# Patient Record
Sex: Female | Born: 1935 | Race: White | Hispanic: No | Smoking: Never smoker
Health system: Southern US, Community
[De-identification: ages and names within clinical notes are randomized; demographics above are authoritative.]

## PROBLEM LIST (undated history)

## (undated) DIAGNOSIS — I219 Acute myocardial infarction, unspecified: Secondary | ICD-10-CM

## (undated) DIAGNOSIS — R350 Frequency of micturition: Secondary | ICD-10-CM

## (undated) DIAGNOSIS — M359 Systemic involvement of connective tissue, unspecified: Secondary | ICD-10-CM

## (undated) DIAGNOSIS — D699 Hemorrhagic condition, unspecified: Secondary | ICD-10-CM

## (undated) DIAGNOSIS — N3020 Other chronic cystitis without hematuria: Secondary | ICD-10-CM

## (undated) DIAGNOSIS — I10 Essential (primary) hypertension: Secondary | ICD-10-CM

## (undated) DIAGNOSIS — R319 Hematuria, unspecified: Secondary | ICD-10-CM

## (undated) DIAGNOSIS — Q898 Other specified congenital malformations: Secondary | ICD-10-CM

## (undated) DIAGNOSIS — I739 Peripheral vascular disease, unspecified: Secondary | ICD-10-CM

## (undated) DIAGNOSIS — E785 Hyperlipidemia, unspecified: Secondary | ICD-10-CM

## (undated) DIAGNOSIS — R310 Gross hematuria: Secondary | ICD-10-CM

## (undated) DIAGNOSIS — R300 Dysuria: Secondary | ICD-10-CM

## (undated) DIAGNOSIS — N390 Urinary tract infection, site not specified: Secondary | ICD-10-CM

## (undated) DIAGNOSIS — Z87442 Personal history of urinary calculi: Secondary | ICD-10-CM

## (undated) DIAGNOSIS — N3941 Urge incontinence: Secondary | ICD-10-CM

## (undated) DIAGNOSIS — E079 Disorder of thyroid, unspecified: Secondary | ICD-10-CM

## (undated) DIAGNOSIS — M1990 Unspecified osteoarthritis, unspecified site: Secondary | ICD-10-CM

## (undated) DIAGNOSIS — R911 Solitary pulmonary nodule: Secondary | ICD-10-CM

## (undated) DIAGNOSIS — R011 Cardiac murmur, unspecified: Secondary | ICD-10-CM

## (undated) DIAGNOSIS — I639 Cerebral infarction, unspecified: Secondary | ICD-10-CM

## (undated) HISTORY — DX: Disorder of thyroid, unspecified: E07.9

## (undated) HISTORY — DX: Personal history of urinary calculi: Z87.442

## (undated) HISTORY — DX: Acute myocardial infarction, unspecified: I21.9

## (undated) HISTORY — PX: CARPAL TUNNEL RELEASE: SHX101

## (undated) HISTORY — DX: Dysuria: R30.0

## (undated) HISTORY — DX: Cerebral infarction, unspecified: I63.9

## (undated) HISTORY — DX: Unspecified osteoarthritis, unspecified site: M19.90

## (undated) HISTORY — PX: TOTAL KNEE ARTHROPLASTY: SHX125

## (undated) HISTORY — DX: Frequency of micturition: R35.0

## (undated) HISTORY — PX: APPENDECTOMY: SHX54

## (undated) HISTORY — DX: Hyperlipidemia, unspecified: E78.5

## (undated) HISTORY — DX: Gross hematuria: R31.0

## (undated) HISTORY — DX: Hematuria, unspecified: R31.9

## (undated) HISTORY — DX: Other specified congenital malformations: Q89.8

## (undated) HISTORY — PX: TOTAL HIP ARTHROPLASTY: SHX124

## (undated) HISTORY — DX: Peripheral vascular disease, unspecified: I73.9

## (undated) HISTORY — PX: CARDIAC SURGERY: SHX584

## (undated) HISTORY — DX: Other chronic cystitis without hematuria: N30.20

## (undated) HISTORY — DX: Solitary pulmonary nodule: R91.1

## (undated) HISTORY — DX: Urinary tract infection, site not specified: N39.0

## (undated) HISTORY — DX: Urge incontinence: N39.41

## (undated) HISTORY — DX: Essential (primary) hypertension: I10

## (undated) HISTORY — DX: Hemorrhagic condition, unspecified: D69.9

## (undated) HISTORY — PX: TONSILLECTOMY: SUR1361

## (undated) HISTORY — PX: OTHER SURGICAL HISTORY: SHX169

## (undated) HISTORY — PX: SPINE SURGERY: SHX786

---

## 1980-01-21 HISTORY — PX: ABDOMINAL HYSTERECTOMY: SHX81

## 2001-01-20 DIAGNOSIS — I639 Cerebral infarction, unspecified: Secondary | ICD-10-CM

## 2001-01-20 HISTORY — DX: Cerebral infarction, unspecified: I63.9

## 2001-10-28 ENCOUNTER — Encounter: Payer: Self-pay | Admitting: Neurosurgery

## 2001-11-03 ENCOUNTER — Inpatient Hospital Stay (HOSPITAL_COMMUNITY): Admission: RE | Admit: 2001-11-03 | Discharge: 2001-11-10 | Payer: Self-pay | Admitting: Neurosurgery

## 2001-11-03 ENCOUNTER — Encounter: Payer: Self-pay | Admitting: Neurosurgery

## 2001-11-04 ENCOUNTER — Encounter: Payer: Self-pay | Admitting: Neurosurgery

## 2001-11-10 ENCOUNTER — Inpatient Hospital Stay
Admission: RE | Admit: 2001-11-10 | Discharge: 2001-11-26 | Payer: Self-pay | Admitting: Physical Medicine & Rehabilitation

## 2001-11-10 ENCOUNTER — Encounter: Payer: Self-pay | Admitting: Physical Medicine & Rehabilitation

## 2001-12-20 ENCOUNTER — Encounter: Admission: RE | Admit: 2001-12-20 | Discharge: 2001-12-20 | Payer: Self-pay | Admitting: Neurosurgery

## 2001-12-20 ENCOUNTER — Encounter: Payer: Self-pay | Admitting: Neurosurgery

## 2002-01-25 ENCOUNTER — Encounter: Payer: Self-pay | Admitting: Neurosurgery

## 2002-01-25 ENCOUNTER — Encounter: Admission: RE | Admit: 2002-01-25 | Discharge: 2002-01-25 | Payer: Self-pay | Admitting: Neurosurgery

## 2002-07-26 ENCOUNTER — Encounter: Payer: Self-pay | Admitting: Neurosurgery

## 2002-07-26 ENCOUNTER — Encounter: Admission: RE | Admit: 2002-07-26 | Discharge: 2002-07-26 | Payer: Self-pay | Admitting: Neurosurgery

## 2003-10-21 ENCOUNTER — Encounter: Payer: Self-pay | Admitting: Internal Medicine

## 2003-11-21 ENCOUNTER — Encounter: Payer: Self-pay | Admitting: Internal Medicine

## 2003-12-21 ENCOUNTER — Encounter: Payer: Self-pay | Admitting: Internal Medicine

## 2004-01-21 ENCOUNTER — Encounter: Payer: Self-pay | Admitting: Internal Medicine

## 2004-02-21 ENCOUNTER — Encounter: Payer: Self-pay | Admitting: Internal Medicine

## 2004-03-15 ENCOUNTER — Encounter: Payer: Self-pay | Admitting: Internal Medicine

## 2004-03-20 ENCOUNTER — Encounter: Payer: Self-pay | Admitting: Internal Medicine

## 2004-04-20 ENCOUNTER — Encounter: Payer: Self-pay | Admitting: Internal Medicine

## 2004-05-20 ENCOUNTER — Encounter: Payer: Self-pay | Admitting: Internal Medicine

## 2004-06-20 ENCOUNTER — Encounter: Payer: Self-pay | Admitting: Internal Medicine

## 2004-07-20 ENCOUNTER — Encounter: Payer: Self-pay | Admitting: Internal Medicine

## 2004-08-20 ENCOUNTER — Encounter: Payer: Self-pay | Admitting: Internal Medicine

## 2004-08-29 ENCOUNTER — Inpatient Hospital Stay: Payer: Self-pay | Admitting: Unknown Physician Specialty

## 2004-08-29 ENCOUNTER — Other Ambulatory Visit: Payer: Self-pay

## 2004-08-30 ENCOUNTER — Other Ambulatory Visit: Payer: Self-pay

## 2004-09-04 ENCOUNTER — Encounter: Payer: Self-pay | Admitting: Internal Medicine

## 2004-11-13 ENCOUNTER — Ambulatory Visit: Payer: Self-pay | Admitting: Internal Medicine

## 2004-12-26 ENCOUNTER — Ambulatory Visit: Payer: Self-pay | Admitting: Internal Medicine

## 2005-01-20 HISTORY — PX: HIP SURGERY: SHX245

## 2005-07-10 ENCOUNTER — Ambulatory Visit: Payer: Self-pay

## 2005-12-29 ENCOUNTER — Ambulatory Visit: Payer: Self-pay | Admitting: Internal Medicine

## 2006-01-02 ENCOUNTER — Ambulatory Visit: Payer: Self-pay | Admitting: Internal Medicine

## 2006-06-22 ENCOUNTER — Ambulatory Visit: Payer: Self-pay

## 2006-07-15 ENCOUNTER — Ambulatory Visit: Payer: Self-pay | Admitting: Internal Medicine

## 2007-02-24 ENCOUNTER — Ambulatory Visit: Payer: Self-pay | Admitting: Internal Medicine

## 2007-03-05 ENCOUNTER — Ambulatory Visit: Payer: Self-pay | Admitting: Internal Medicine

## 2007-03-19 ENCOUNTER — Ambulatory Visit: Payer: Self-pay | Admitting: Internal Medicine

## 2007-04-14 ENCOUNTER — Ambulatory Visit: Payer: Self-pay | Admitting: Vascular Surgery

## 2007-04-27 ENCOUNTER — Other Ambulatory Visit: Payer: Self-pay

## 2007-04-27 ENCOUNTER — Ambulatory Visit: Payer: Self-pay | Admitting: Vascular Surgery

## 2007-05-06 ENCOUNTER — Inpatient Hospital Stay: Payer: Self-pay | Admitting: Vascular Surgery

## 2007-06-24 ENCOUNTER — Inpatient Hospital Stay: Payer: Self-pay | Admitting: Internal Medicine

## 2007-06-24 ENCOUNTER — Other Ambulatory Visit: Payer: Self-pay

## 2007-07-16 ENCOUNTER — Ambulatory Visit: Payer: Self-pay | Admitting: Internal Medicine

## 2007-09-07 ENCOUNTER — Ambulatory Visit: Payer: Self-pay | Admitting: Neurosurgery

## 2008-03-14 ENCOUNTER — Ambulatory Visit: Payer: Self-pay | Admitting: Internal Medicine

## 2008-03-14 ENCOUNTER — Ambulatory Visit: Payer: Self-pay | Admitting: Ophthalmology

## 2008-03-27 ENCOUNTER — Ambulatory Visit: Payer: Self-pay | Admitting: Ophthalmology

## 2008-06-29 ENCOUNTER — Ambulatory Visit: Payer: Self-pay | Admitting: Vascular Surgery

## 2008-07-03 ENCOUNTER — Emergency Department: Payer: Self-pay | Admitting: Emergency Medicine

## 2008-07-17 ENCOUNTER — Ambulatory Visit: Payer: Self-pay | Admitting: Vascular Surgery

## 2009-01-16 ENCOUNTER — Emergency Department: Payer: Self-pay | Admitting: Emergency Medicine

## 2009-02-09 ENCOUNTER — Ambulatory Visit: Payer: Self-pay | Admitting: Internal Medicine

## 2009-02-27 ENCOUNTER — Emergency Department: Payer: Self-pay | Admitting: Emergency Medicine

## 2009-03-20 ENCOUNTER — Ambulatory Visit: Payer: Self-pay | Admitting: Neurosurgery

## 2009-06-22 ENCOUNTER — Ambulatory Visit: Payer: Self-pay | Admitting: Internal Medicine

## 2009-07-25 ENCOUNTER — Ambulatory Visit: Payer: Self-pay | Admitting: Internal Medicine

## 2009-08-07 ENCOUNTER — Ambulatory Visit: Payer: Self-pay | Admitting: Internal Medicine

## 2010-03-04 ENCOUNTER — Ambulatory Visit: Payer: Self-pay | Admitting: Unknown Physician Specialty

## 2010-03-13 ENCOUNTER — Ambulatory Visit: Payer: Self-pay | Admitting: Internal Medicine

## 2010-03-26 ENCOUNTER — Ambulatory Visit: Payer: Self-pay | Admitting: Ophthalmology

## 2010-04-04 ENCOUNTER — Ambulatory Visit: Payer: Self-pay | Admitting: Internal Medicine

## 2010-04-08 ENCOUNTER — Ambulatory Visit: Payer: Self-pay | Admitting: Ophthalmology

## 2010-04-30 ENCOUNTER — Ambulatory Visit: Payer: Self-pay | Admitting: Anesthesiology

## 2010-05-02 DIAGNOSIS — K5900 Constipation, unspecified: Secondary | ICD-10-CM | POA: Insufficient documentation

## 2010-05-09 ENCOUNTER — Inpatient Hospital Stay: Payer: Self-pay | Admitting: Internal Medicine

## 2010-05-20 ENCOUNTER — Ambulatory Visit: Payer: Self-pay | Admitting: Anesthesiology

## 2010-06-11 ENCOUNTER — Ambulatory Visit: Payer: Self-pay | Admitting: Anesthesiology

## 2010-08-07 ENCOUNTER — Ambulatory Visit: Payer: Self-pay | Admitting: Internal Medicine

## 2010-10-04 ENCOUNTER — Other Ambulatory Visit: Payer: Self-pay | Admitting: Internal Medicine

## 2010-10-04 DIAGNOSIS — E785 Hyperlipidemia, unspecified: Secondary | ICD-10-CM

## 2010-10-04 MED ORDER — PRAVASTATIN SODIUM 40 MG PO TABS: 40.0000 mg | ORAL_TABLET | Freq: Every evening | ORAL | Status: DC

## 2010-10-17 ENCOUNTER — Encounter: Payer: Self-pay | Admitting: Internal Medicine

## 2010-10-17 ENCOUNTER — Ambulatory Visit (INDEPENDENT_AMBULATORY_CARE_PROVIDER_SITE_OTHER): Payer: Medicare Other | Admitting: Internal Medicine

## 2010-10-17 VITALS — BP 121/58 | HR 67 | Temp 98.3°F | Resp 16 | Ht 63.0 in | Wt 130.0 lb

## 2010-10-17 DIAGNOSIS — I10 Essential (primary) hypertension: Secondary | ICD-10-CM | POA: Insufficient documentation

## 2010-10-17 DIAGNOSIS — M1990 Unspecified osteoarthritis, unspecified site: Secondary | ICD-10-CM | POA: Insufficient documentation

## 2010-10-17 DIAGNOSIS — B029 Zoster without complications: Secondary | ICD-10-CM

## 2010-10-17 DIAGNOSIS — E039 Hypothyroidism, unspecified: Secondary | ICD-10-CM

## 2010-10-17 MED ORDER — HYDROCODONE-ACETAMINOPHEN 5-500 MG PO TABS: 1.0000 | ORAL_TABLET | ORAL | Status: DC | PRN

## 2010-10-17 MED ORDER — CLONIDINE HCL 0.1 MG/24HR TD PTWK: 1.0000 | MEDICATED_PATCH | TRANSDERMAL | Status: DC

## 2010-10-17 MED ORDER — VALACYCLOVIR HCL 1 G PO TABS: 1000.0000 mg | ORAL_TABLET | Freq: Two times a day (BID) | ORAL | Status: DC

## 2010-10-17 NOTE — Patient Instructions (Addendum)
Labs today.    Follow up in 1 month

## 2010-10-17 NOTE — Progress Notes (Signed)
Subjective:    Patient ID: Krista Ellis, female    DOB: 13-Nov-1930, 77 y.o.   MRN: [id number]  HPI Krista Ellis is a 77 year old female who presents to followup hypertension and chronic arthritic pain. She is primarily concerned today about severe right sided mid back pain. She reports this started approximately 2 days ago. She developed sudden onset of right-sided back pain. She denies any known injury to this site. She reports that with movement or a deep breath she has significant pain on the right side. She denies any shortness of breath. She denies any chest pain, palpitations, diaphoresis, nausea. She denies any fever or chills. She has been taking her meloxicam and using Vicodin as needed for severe pain with minimal improvement.  In regards to her hypertension she reports that she has been compliant with her medications. She reports that her blood pressure at home is typically been in the 130s over 50s.  She is concerned today because of recent thinning of her hair. She questions whether this may be related to low thyroid function.  Outpatient Encounter Prescriptions as of 10/17/2010  Medication Sig Dispense Refill  . amLODipine (NORVASC) 10 MG tablet Take 10 mg by mouth Daily.      . cilostazol (PLETAL) 100 MG tablet Take 100 mg by mouth 2 (two) times daily.      . cloNIDine (CATAPRES - DOSED IN MG/24 HR) 0.1 mg/24hr patch Place 1 patch (0.1 mg total) onto the skin once a week.  4 patch  11  . HYDROcodone-acetaminophen (VICODIN) 5-500 MG per tablet Take 1 tablet by mouth every 4 (four) hours as needed.  60 tablet  3  . levothyroxine (SYNTHROID, LEVOTHROID) 25 MCG tablet Take 25 mcg by mouth daily.      . meloxicam (MOBIC) 7.5 MG tablet Take 1 tablet by mouth Once daily as needed.      . metoprolol (TOPROL-XL) 50 MG 24 hr tablet Take 1 tablet by mouth Twice daily.      Marland Kitchen PLAVIX 75 MG tablet Take 1 tablet by mouth Daily.      . pravastatin (PRAVACHOL) 40 MG tablet Take 1 tablet (40 mg  total) by mouth every evening.  30 tablet  11  . valACYclovir (VALTREX) 1000 MG tablet Take 1 tablet (1,000 mg total) by mouth 2 (two) times daily.  14 tablet  0    Review of Systems  Constitutional: Negative for fever, chills, appetite change, fatigue and unexpected weight change.  HENT: Negative for ear pain, congestion, sore throat, trouble swallowing, neck pain, voice change and sinus pressure.   Eyes: Negative for visual disturbance.  Respiratory: Negative for cough, shortness of breath, wheezing and stridor.   Cardiovascular: Negative for chest pain, palpitations and leg swelling.  Gastrointestinal: Negative for nausea, vomiting, abdominal pain, diarrhea, constipation, blood in stool, abdominal distention and anal bleeding.  Genitourinary: Negative for dysuria and flank pain.  Musculoskeletal: Positive for myalgias and arthralgias. Negative for gait problem.  Skin: Negative for color change and rash.  Neurological: Negative for dizziness and headaches.  Hematological: Negative for adenopathy. Does not bruise/bleed easily.  Psychiatric/Behavioral: Negative for suicidal ideas, sleep disturbance and dysphoric mood. The patient is not nervous/anxious.    BP 121/58  Pulse 67  Temp(Src) 98.3 F (36.8 C) (Oral)  Resp 16  Ht 5\' 3"  (1.6 m)  Wt 130 lb (58.968 kg)  BMI 23.03 kg/m2  SpO2 96%     Objective:   Physical Exam  Constitutional: She is oriented  to person, place, and time. She appears well-developed and well-nourished. No distress.  HENT:  Head: Normocephalic and atraumatic.  Right Ear: External ear normal.  Left Ear: External ear normal.  Nose: Nose normal.  Mouth/Throat: Oropharynx is clear and moist. No oropharyngeal exudate.  Eyes: Conjunctivae are normal. Pupils are equal, round, and reactive to light. Right eye exhibits no discharge. Left eye exhibits no discharge. No scleral icterus.  Neck: Normal range of motion. Neck supple. No tracheal deviation present. No  thyromegaly present.  Cardiovascular: Normal rate, regular rhythm, normal heart sounds and intact distal pulses.  Exam reveals no gallop and no friction rub.   No murmur heard. Pulmonary/Chest: Effort normal and breath sounds normal. No respiratory distress. She has no wheezes. She has no rales. She exhibits no tenderness.  Musculoskeletal: Normal range of motion. She exhibits no edema and no tenderness.  Lymphadenopathy:    She has no cervical adenopathy.  Neurological: She is alert and oriented to person, place, and time. No cranial nerve deficit. She exhibits normal muscle tone. Coordination normal.  Skin: Skin is warm and dry. Rash noted. She is not diaphoretic. There is erythema. No pallor.     Psychiatric: She has a normal mood and affect. Her behavior is normal. Judgment and thought content normal.          Assessment & Plan:  1. Shingles - patient with right-sided back pain in a single nerve distribution. That area is erythematous on exam with a few papular areas which may represent early vesicles. I suspect that she has shingles. Given that her pain only began 2 days ago she may still derive some benefit from Valtrex. We will treat her with Valtrex 1 g twice daily given her renal insufficiency. She will use Vicodin as needed for severe pain. We discussed adding Neurontin but she would prefer to hold off for now. She will call or return to clinic should symptoms worsen.   2. Hypertension - patient with a history of hypertension. Blood pressure is currently well-controlled on medications. We will recheck renal function with labs today. She will followup in one month.  3. Hypothyroidism - patient history of hypothyroidism. She reports that her hair has seemed thinner recently. We will check TSH with labs today.

## 2010-10-18 ENCOUNTER — Encounter: Payer: Self-pay | Admitting: Internal Medicine

## 2010-10-18 LAB — COMPREHENSIVE METABOLIC PANEL
ALT: 16 U/L (ref 0–35)
Alkaline Phosphatase: 41 U/L (ref 39–117)
Sodium: 138 mEq/L (ref 135–145)
Total Bilirubin: 0.5 mg/dL (ref 0.3–1.2)
Total Protein: 7.2 g/dL (ref 6.0–8.3)

## 2010-10-18 LAB — TSH: TSH: 1.97 u[IU]/mL (ref 0.35–5.50)

## 2010-10-25 ENCOUNTER — Encounter: Payer: Self-pay | Admitting: Internal Medicine

## 2010-12-02 ENCOUNTER — Other Ambulatory Visit: Payer: Self-pay | Admitting: Internal Medicine

## 2010-12-02 DIAGNOSIS — E785 Hyperlipidemia, unspecified: Secondary | ICD-10-CM

## 2010-12-02 MED ORDER — PRAVASTATIN SODIUM 40 MG PO TABS: 40.0000 mg | ORAL_TABLET | Freq: Every evening | ORAL | Status: DC

## 2010-12-02 NOTE — Telephone Encounter (Signed)
Patient needs refill for 90 days at [facility] [address]

## 2010-12-02 NOTE — Telephone Encounter (Signed)
Rx changed to 90 day supply, attempted to call pt, # busy

## 2010-12-23 ENCOUNTER — Ambulatory Visit: Payer: Medicare Other | Admitting: Internal Medicine

## 2010-12-25 ENCOUNTER — Ambulatory Visit (INDEPENDENT_AMBULATORY_CARE_PROVIDER_SITE_OTHER): Payer: Medicare Other | Admitting: Internal Medicine

## 2010-12-25 ENCOUNTER — Encounter: Payer: Self-pay | Admitting: Internal Medicine

## 2010-12-25 VITALS — BP 132/52 | HR 54 | Temp 98.1°F | Wt 126.0 lb

## 2010-12-25 DIAGNOSIS — I10 Essential (primary) hypertension: Secondary | ICD-10-CM

## 2010-12-25 DIAGNOSIS — M25571 Pain in right ankle and joints of right foot: Secondary | ICD-10-CM

## 2010-12-25 DIAGNOSIS — M25579 Pain in unspecified ankle and joints of unspecified foot: Secondary | ICD-10-CM

## 2010-12-25 MED ORDER — CLONIDINE HCL 0.1 MG/24HR TD PTWK: 1.0000 | MEDICATED_PATCH | TRANSDERMAL | Status: DC

## 2010-12-25 NOTE — Progress Notes (Signed)
Subjective:    Patient ID: Krista Ellis, female    DOB: 24-Aug-1930, 77 y.o.   MRN: [id number]  HPI 77YO female with h/o hypertension and osteoarthritis presents for follow up. In regards to her hypertension, she reports that her BP has recently been well controlled. She reports full compliance with her medications. She denies chest pain, palpitations, or headache.  She is concerned about chronic arthritis pain and, in particular, recent onset of pain in her right ankle.  This pain has been present for 2-3 days. She denies any known injury to her ankle. She reports that the pain is similar to pain she had in the past  Outpatient Encounter Prescriptions as of 12/25/2010  Medication Sig Dispense Refill  . amLODipine (NORVASC) 10 MG tablet Take 10 mg by mouth Daily.      . cloNIDine (CATAPRES - DOSED IN MG/24 HR) 0.1 mg/24hr patch Place 1 patch (0.1 mg total) onto the skin once a week.  4 patch  11  . HYDROcodone-acetaminophen (VICODIN) 5-500 MG per tablet Take 1 tablet by mouth every 4 (four) hours as needed.  60 tablet  3  . levothyroxine (SYNTHROID, LEVOTHROID) 25 MCG tablet Take 25 mcg by mouth daily.      . meloxicam (MOBIC) 7.5 MG tablet Take 1 tablet by mouth Once daily as needed.      . metoprolol (TOPROL-XL) 50 MG 24 hr tablet Take 1 tablet by mouth Twice daily.      Marland Kitchen PLAVIX 75 MG tablet Take 1 tablet by mouth Daily.      . pravastatin (PRAVACHOL) 40 MG tablet Take 1 tablet (40 mg total) by mouth every evening.  90 tablet  3  . cilostazol (PLETAL) 100 MG tablet Take 100 mg by mouth 2 (two) times daily.        Review of Systems  Constitutional: Negative for fever, chills, appetite change, fatigue and unexpected weight change.  HENT: Negative for ear pain, congestion, sore throat, trouble swallowing, neck pain, voice change and sinus pressure.   Eyes: Negative for visual disturbance.  Respiratory: Negative for cough, shortness of breath, wheezing and stridor.   Cardiovascular:  Negative for chest pain, palpitations and leg swelling.  Gastrointestinal: Negative for nausea, vomiting, abdominal pain, diarrhea, constipation and abdominal distention.  Genitourinary: Negative for dysuria and flank pain.  Musculoskeletal: Positive for myalgias, back pain, joint swelling and arthralgias. Negative for gait problem.  Skin: Negative for color change and rash.  Neurological: Negative for dizziness and headaches.  Hematological: Negative for adenopathy. Does not bruise/bleed easily.  Psychiatric/Behavioral: Negative for suicidal ideas, sleep disturbance and dysphoric mood. The patient is not nervous/anxious.    BP 132/52  Pulse 54  Temp(Src) 98.1 F (36.7 C) (Oral)  Wt 126 lb (57.153 kg)  SpO2 96%     Objective:   Physical Exam  Constitutional: She is oriented to person, place, and time. She appears well-developed and well-nourished. No distress.  HENT:  Head: Normocephalic and atraumatic.  Right Ear: External ear normal.  Left Ear: External ear normal.  Nose: Nose normal.  Mouth/Throat: Oropharynx is clear and moist. No oropharyngeal exudate.  Eyes: Conjunctivae are normal. Pupils are equal, round, and reactive to light. Right eye exhibits no discharge. Left eye exhibits no discharge. No scleral icterus.  Neck: Normal range of motion. Neck supple. No tracheal deviation present. No thyromegaly present.  Cardiovascular: Normal rate, regular rhythm, normal heart sounds and intact distal pulses.  Exam reveals no gallop and no friction rub.  No murmur heard. Pulmonary/Chest: Effort normal and breath sounds normal. No respiratory distress. She has no wheezes. She has no rales. She exhibits no tenderness.  Musculoskeletal: Normal range of motion. She exhibits no edema and no tenderness.       Feet:  Lymphadenopathy:    She has no cervical adenopathy.  Neurological: She is alert and oriented to person, place, and time. No cranial nerve deficit. She exhibits normal muscle  tone. Coordination normal.  Skin: Skin is warm and dry. No rash noted. She is not diaphoretic. No erythema. No pallor.  Psychiatric: She has a normal mood and affect. Her behavior is normal. Judgment and thought content normal.          Assessment & Plan:  1. Right ankle pain -exam concerning for fracture. will get plain film of her right ankle. She has followup with orthopedics next week.  2. Hypertension - blood pressure well-controlled. We'll continue current medications. Will recheck renal function with labs in 3 months.

## 2011-01-30 DIAGNOSIS — I6529 Occlusion and stenosis of unspecified carotid artery: Secondary | ICD-10-CM | POA: Diagnosis not present

## 2011-01-30 DIAGNOSIS — I658 Occlusion and stenosis of other precerebral arteries: Secondary | ICD-10-CM | POA: Diagnosis not present

## 2011-01-30 DIAGNOSIS — I70219 Atherosclerosis of native arteries of extremities with intermittent claudication, unspecified extremity: Secondary | ICD-10-CM | POA: Diagnosis not present

## 2011-01-30 DIAGNOSIS — Q7290 Unspecified reduction defect of unspecified lower limb: Secondary | ICD-10-CM | POA: Diagnosis not present

## 2011-03-05 DIAGNOSIS — R339 Retention of urine, unspecified: Secondary | ICD-10-CM | POA: Diagnosis not present

## 2011-03-05 DIAGNOSIS — N3020 Other chronic cystitis without hematuria: Secondary | ICD-10-CM | POA: Diagnosis not present

## 2011-03-13 DIAGNOSIS — Z0189 Encounter for other specified special examinations: Secondary | ICD-10-CM | POA: Diagnosis not present

## 2011-03-13 DIAGNOSIS — I8310 Varicose veins of unspecified lower extremity with inflammation: Secondary | ICD-10-CM | POA: Diagnosis not present

## 2011-03-13 DIAGNOSIS — L989 Disorder of the skin and subcutaneous tissue, unspecified: Secondary | ICD-10-CM | POA: Diagnosis not present

## 2011-03-13 DIAGNOSIS — L738 Other specified follicular disorders: Secondary | ICD-10-CM | POA: Diagnosis not present

## 2011-03-26 ENCOUNTER — Ambulatory Visit (INDEPENDENT_AMBULATORY_CARE_PROVIDER_SITE_OTHER): Payer: Medicare Other | Admitting: Internal Medicine

## 2011-03-26 ENCOUNTER — Encounter: Payer: Self-pay | Admitting: Internal Medicine

## 2011-03-26 DIAGNOSIS — M1990 Unspecified osteoarthritis, unspecified site: Secondary | ICD-10-CM

## 2011-03-26 DIAGNOSIS — E785 Hyperlipidemia, unspecified: Secondary | ICD-10-CM

## 2011-03-26 DIAGNOSIS — I10 Essential (primary) hypertension: Secondary | ICD-10-CM

## 2011-03-26 DIAGNOSIS — E039 Hypothyroidism, unspecified: Secondary | ICD-10-CM | POA: Diagnosis not present

## 2011-03-26 LAB — LIPID PANEL
Cholesterol: 144 mg/dL (ref 0–200)
HDL: 53 mg/dL (ref >39.00)
LDL Cholesterol: 68 mg/dL (ref 0–99)
Triglycerides: 114 mg/dL (ref 0.0–149.0)

## 2011-03-26 LAB — COMPREHENSIVE METABOLIC PANEL
ALT: 14 U/L (ref 0–35)
AST: 20 U/L (ref 0–37)
BUN: 23 mg/dL (ref 6–23)
Calcium: 9.4 mg/dL (ref 8.4–10.5)
Chloride: 104 mEq/L (ref 96–112)
Creatinine, Ser: 0.9 mg/dL (ref 0.4–1.2)
Total Bilirubin: 0.3 mg/dL (ref 0.3–1.2)

## 2011-03-26 NOTE — Assessment & Plan Note (Signed)
Persistent pain, most prominent in lower back in the mornings. Fair control with meloxicam and prn vicodin. Will continue. Follow up 6 months.

## 2011-03-26 NOTE — Assessment & Plan Note (Signed)
BP well controlled today.  Will continue current meds. Will check renal function with labs. Follow up 6 months.

## 2011-03-26 NOTE — Assessment & Plan Note (Signed)
Will check TSH with labs today. Continue levothyroxine. Follow up 6 months.

## 2011-03-26 NOTE — Progress Notes (Signed)
Subjective:    Patient ID: Krista Ellis, female    DOB: 1930-09-02, 78 y.o.   MRN: [id number]  HPI 78YO female with HTN, HL, hypothyroidism,and OA presents for follow up. She reports that lower back pain is persistent despite use of meloxicam and vicodin. It is most prominent in the morning.  It improves with medication and heating pad.  She has been told she is not a candidate for surgery, and she feels that symptoms are adequately controlled on current regimen.  In regards to HTN, she notes BP has been well controlled with SBP typically between 110-115.  She denies headache, palpitations, or chest pain. She notes full compliance with meds. She denies any fatigue or other complaints today. She reports good appetite and energy level.  Outpatient Encounter Prescriptions as of 03/26/2011  Medication Sig Dispense Refill  . amLODipine (NORVASC) 10 MG tablet Take 10 mg by mouth Daily.      . cilostazol (PLETAL) 100 MG tablet Take 100 mg by mouth 2 (two) times daily.      . cloNIDine (CATAPRES - DOSED IN MG/24 HR) 0.1 mg/24hr patch Place 1 patch (0.1 mg total) onto the skin once a week.  4 patch  11  . HYDROcodone-acetaminophen (VICODIN) 5-500 MG per tablet Take 1 tablet by mouth every 4 (four) hours as needed.  60 tablet  3  . levothyroxine (SYNTHROID, LEVOTHROID) 25 MCG tablet Take 25 mcg by mouth daily.      . meloxicam (MOBIC) 7.5 MG tablet Take 1 tablet by mouth Once daily as needed.      . metoprolol (TOPROL-XL) 50 MG 24 hr tablet Take 1 tablet by mouth Twice daily.      Marland Kitchen PLAVIX 75 MG tablet Take 1 tablet by mouth Daily.      . pravastatin (PRAVACHOL) 40 MG tablet Take 1 tablet (40 mg total) by mouth every evening.  90 tablet  3    Review of Systems  Constitutional: Negative for fever, chills, appetite change, fatigue and unexpected weight change.  HENT: Negative for ear pain, congestion, sore throat, trouble swallowing, neck pain, voice change and sinus pressure.   Eyes: Negative for  visual disturbance.  Respiratory: Negative for cough, shortness of breath, wheezing and stridor.   Cardiovascular: Negative for chest pain, palpitations and leg swelling.  Gastrointestinal: Negative for nausea, vomiting, abdominal pain, diarrhea, constipation, blood in stool, abdominal distention and anal bleeding.  Genitourinary: Negative for dysuria and flank pain.  Musculoskeletal: Positive for back pain and arthralgias. Negative for myalgias. Gait problem: secondary to pain.  Skin: Negative for color change and rash.  Neurological: Negative for dizziness and headaches.  Hematological: Negative for adenopathy. Does not bruise/bleed easily.  Psychiatric/Behavioral: Negative for suicidal ideas, sleep disturbance and dysphoric mood. The patient is not nervous/anxious.    BP 122/52  Pulse 61  Temp(Src) 97.9 F (36.6 C) (Oral)  Ht 5\' 3"  (1.6 m)  Wt 128 lb (58.06 kg)  BMI 22.67 kg/m2  SpO2 97%     Objective:   Physical Exam  Constitutional: She is oriented to person, place, and time. She appears well-developed and well-nourished. No distress.  HENT:  Head: Normocephalic and atraumatic.  Right Ear: External ear normal.  Left Ear: External ear normal.  Nose: Nose normal.  Mouth/Throat: Oropharynx is clear and moist. No oropharyngeal exudate.  Eyes: Conjunctivae are normal. Pupils are equal, round, and reactive to light. Right eye exhibits no discharge. Left eye exhibits no discharge. No scleral icterus.  Neck:  Normal range of motion. Neck supple. No tracheal deviation present. No thyromegaly present.  Cardiovascular: Normal rate, regular rhythm, normal heart sounds and intact distal pulses.  Exam reveals no gallop and no friction rub.   No murmur heard. Pulmonary/Chest: Effort normal and breath sounds normal. No respiratory distress. She has no wheezes. She has no rales. She exhibits no tenderness.  Musculoskeletal: She exhibits no edema and no tenderness.       Lumbar back: She  exhibits decreased range of motion, tenderness and pain.  Lymphadenopathy:    She has no cervical adenopathy.  Neurological: She is alert and oriented to person, place, and time. No cranial nerve deficit. She exhibits normal muscle tone. Coordination normal.  Skin: Skin is warm and dry. No rash noted. She is not diaphoretic. No erythema. No pallor.  Psychiatric: She has a normal mood and affect. Her behavior is normal. Judgment and thought content normal.          Assessment & Plan:

## 2011-03-27 DIAGNOSIS — N3020 Other chronic cystitis without hematuria: Secondary | ICD-10-CM | POA: Diagnosis not present

## 2011-04-01 DIAGNOSIS — N3020 Other chronic cystitis without hematuria: Secondary | ICD-10-CM | POA: Diagnosis not present

## 2011-04-07 DIAGNOSIS — M19019 Primary osteoarthritis, unspecified shoulder: Secondary | ICD-10-CM | POA: Diagnosis not present

## 2011-05-05 DIAGNOSIS — R339 Retention of urine, unspecified: Secondary | ICD-10-CM | POA: Diagnosis not present

## 2011-05-05 DIAGNOSIS — N3941 Urge incontinence: Secondary | ICD-10-CM | POA: Diagnosis not present

## 2011-05-05 DIAGNOSIS — R3911 Hesitancy of micturition: Secondary | ICD-10-CM | POA: Diagnosis not present

## 2011-05-05 DIAGNOSIS — N3020 Other chronic cystitis without hematuria: Secondary | ICD-10-CM | POA: Diagnosis not present

## 2011-05-06 ENCOUNTER — Telehealth: Payer: Self-pay | Admitting: Internal Medicine

## 2011-05-06 NOTE — Telephone Encounter (Signed)
[facility] Triage Call Report Triage Record Num: [id number] Operator: Geanie Berlin Patient Name: Krista Ellis Call Date & Time: 05/06/2011 2:03:29PM Patient Phone: [phone] PCP: Ronna Polio Patient Gender: Female PCP Fax : [phone] Patient DOB: 09/10/1930 Practice Name: [facility] Reason for Call: Caller: [name]/Patient; PCP: Ronna Polio; CB#: [phone]; Call regarding Chest Pain/Chest Discomfort. Reports intermittent R sided upper back pain for 2 days. Onset: 05/04/11. Afebrile. Pain is not worse with breathing, moves or coughs. Jaw felt "tired" Generally felt week and tired for past several days. Advised to call 911 now for currenly having unexplained profound weakness per Chest Pain Guideline. Protocol(s) Used: Chest Pain Recommended Outcome per Protocol: Activate EMS 911 Reason for Outcome: Currently having unexplained profound weakness or dizziness Care Advice: ~ Protect the patient from falling or other harm. ~ IMMEDIATE ACTION Write down provider's name. List or place the following in a bag for transport with the patient: current prescription and/or nonprescription medications; alternative treatments, therapies and medications; and street drugs. ~ After calling EMS 911, have the person chew one aspirin tablet (325 mg), or 4 baby aspirin (81mg ) with a small amount of water now if conscious, not allergic to aspirin, or if has not been told to avoid taking aspirin by their provider. It is important to use aspirin, not acetaminophen. ~ 05/06/2011 2:20:18PM Page 1 of 1 [email or web address]

## 2011-05-06 NOTE — Telephone Encounter (Signed)
Caller: Iva/Patient; PCP: Ronna Polio; CB#: [phone]; Call regarding Chest Pain/Chest Discomfort.  Reports intermittent  R sided upper back pain for 2 days. Onset: 05/04/11.  Afebrile. Pain is not worse with breathing, moves or coughs. Jaw felt "tired"   Generally felt weak and tired for past several days.  Advised to call 911 now for currenly having unexplained profound weakness per Chest Pain Guideline.

## 2011-05-08 DIAGNOSIS — R079 Chest pain, unspecified: Secondary | ICD-10-CM | POA: Diagnosis not present

## 2011-05-08 DIAGNOSIS — R05 Cough: Secondary | ICD-10-CM | POA: Diagnosis not present

## 2011-05-08 DIAGNOSIS — J069 Acute upper respiratory infection, unspecified: Secondary | ICD-10-CM | POA: Diagnosis not present

## 2011-05-08 DIAGNOSIS — J209 Acute bronchitis, unspecified: Secondary | ICD-10-CM | POA: Diagnosis not present

## 2011-05-17 DIAGNOSIS — R0989 Other specified symptoms and signs involving the circulatory and respiratory systems: Secondary | ICD-10-CM | POA: Diagnosis not present

## 2011-05-17 LAB — COMPREHENSIVE METABOLIC PANEL
Albumin: 3.1 g/dL — ABNORMAL LOW (ref 3.4–5.0)
BUN: 21 mg/dL — ABNORMAL HIGH (ref 7–18)
Bilirubin,Total: 0.7 mg/dL (ref 0.2–1.0)
Chloride: 104 mmol/L (ref 98–107)
Co2: 26 mmol/L (ref 21–32)
Creatinine: 0.85 mg/dL (ref 0.60–1.30)
Glucose: 104 mg/dL — ABNORMAL HIGH (ref 65–99)
Osmolality: 277 (ref 275–301)
SGOT(AST): 33 U/L (ref 15–37)
SGPT (ALT): 24 U/L
Sodium: 137 mmol/L (ref 136–145)

## 2011-05-17 LAB — URINALYSIS, COMPLETE
Bilirubin,UR: NEGATIVE
Hyaline Cast: 1
Nitrite: NEGATIVE
RBC,UR: 13 /HPF (ref 0–5)
Squamous Epithelial: 1

## 2011-05-17 LAB — CBC
HCT: 37.1 % (ref 35.0–47.0)
HGB: 12.2 g/dL (ref 12.0–16.0)
MCHC: 32.8 g/dL (ref 32.0–36.0)
Platelet: 309 10*3/uL (ref 150–440)
RDW: 12.1 % (ref 11.5–14.5)
WBC: 24.4 10*3/uL — ABNORMAL HIGH (ref 3.6–11.0)

## 2011-05-17 LAB — TROPONIN I: Troponin-I: <0.02 ng/mL

## 2011-05-18 ENCOUNTER — Inpatient Hospital Stay: Payer: Self-pay | Admitting: Specialist

## 2011-05-18 DIAGNOSIS — R627 Adult failure to thrive: Secondary | ICD-10-CM | POA: Diagnosis present

## 2011-05-18 DIAGNOSIS — B9789 Other viral agents as the cause of diseases classified elsewhere: Secondary | ICD-10-CM | POA: Diagnosis not present

## 2011-05-18 DIAGNOSIS — R5381 Other malaise: Secondary | ICD-10-CM | POA: Diagnosis not present

## 2011-05-18 DIAGNOSIS — E785 Hyperlipidemia, unspecified: Secondary | ICD-10-CM | POA: Diagnosis not present

## 2011-05-18 DIAGNOSIS — R0602 Shortness of breath: Secondary | ICD-10-CM | POA: Diagnosis present

## 2011-05-18 DIAGNOSIS — E860 Dehydration: Secondary | ICD-10-CM | POA: Diagnosis not present

## 2011-05-18 DIAGNOSIS — K219 Gastro-esophageal reflux disease without esophagitis: Secondary | ICD-10-CM | POA: Diagnosis present

## 2011-05-18 DIAGNOSIS — N390 Urinary tract infection, site not specified: Secondary | ICD-10-CM | POA: Diagnosis not present

## 2011-05-18 DIAGNOSIS — R011 Cardiac murmur, unspecified: Secondary | ICD-10-CM | POA: Diagnosis present

## 2011-05-18 DIAGNOSIS — I10 Essential (primary) hypertension: Secondary | ICD-10-CM | POA: Diagnosis present

## 2011-05-18 DIAGNOSIS — Z79899 Other long term (current) drug therapy: Secondary | ICD-10-CM | POA: Diagnosis not present

## 2011-05-18 DIAGNOSIS — Z881 Allergy status to other antibiotic agents status: Secondary | ICD-10-CM | POA: Diagnosis not present

## 2011-05-18 DIAGNOSIS — K279 Peptic ulcer, site unspecified, unspecified as acute or chronic, without hemorrhage or perforation: Secondary | ICD-10-CM | POA: Diagnosis present

## 2011-05-18 DIAGNOSIS — D72829 Elevated white blood cell count, unspecified: Secondary | ICD-10-CM | POA: Diagnosis not present

## 2011-05-18 DIAGNOSIS — Z9889 Other specified postprocedural states: Secondary | ICD-10-CM | POA: Diagnosis not present

## 2011-05-18 DIAGNOSIS — J309 Allergic rhinitis, unspecified: Secondary | ICD-10-CM | POA: Diagnosis present

## 2011-05-18 DIAGNOSIS — K8020 Calculus of gallbladder without cholecystitis without obstruction: Secondary | ICD-10-CM | POA: Diagnosis not present

## 2011-05-18 DIAGNOSIS — M1990 Unspecified osteoarthritis, unspecified site: Secondary | ICD-10-CM | POA: Diagnosis not present

## 2011-05-18 DIAGNOSIS — I509 Heart failure, unspecified: Secondary | ICD-10-CM | POA: Diagnosis not present

## 2011-05-18 DIAGNOSIS — R112 Nausea with vomiting, unspecified: Secondary | ICD-10-CM | POA: Diagnosis not present

## 2011-05-18 DIAGNOSIS — Z9089 Acquired absence of other organs: Secondary | ICD-10-CM | POA: Diagnosis not present

## 2011-05-18 DIAGNOSIS — Z7902 Long term (current) use of antithrombotics/antiplatelets: Secondary | ICD-10-CM | POA: Diagnosis not present

## 2011-05-18 DIAGNOSIS — I8390 Asymptomatic varicose veins of unspecified lower extremity: Secondary | ICD-10-CM | POA: Diagnosis present

## 2011-05-18 DIAGNOSIS — Z882 Allergy status to sulfonamides status: Secondary | ICD-10-CM | POA: Diagnosis not present

## 2011-05-18 DIAGNOSIS — Z880 Allergy status to penicillin: Secondary | ICD-10-CM | POA: Diagnosis not present

## 2011-05-18 DIAGNOSIS — M810 Age-related osteoporosis without current pathological fracture: Secondary | ICD-10-CM | POA: Diagnosis present

## 2011-05-18 DIAGNOSIS — Z8249 Family history of ischemic heart disease and other diseases of the circulatory system: Secondary | ICD-10-CM | POA: Diagnosis not present

## 2011-05-18 DIAGNOSIS — Z87440 Personal history of urinary (tract) infections: Secondary | ICD-10-CM | POA: Diagnosis not present

## 2011-05-18 DIAGNOSIS — Z90710 Acquired absence of both cervix and uterus: Secondary | ICD-10-CM | POA: Diagnosis not present

## 2011-05-18 DIAGNOSIS — E039 Hypothyroidism, unspecified: Secondary | ICD-10-CM | POA: Diagnosis present

## 2011-05-19 LAB — CBC WITH DIFFERENTIAL/PLATELET
Basophil #: 0 10*3/uL (ref 0.0–0.1)
Basophil %: 0.1 %
Eosinophil #: 0 10*3/uL (ref 0.0–0.7)
Eosinophil %: 0.3 %
HCT: 38 % (ref 35.0–47.0)
HGB: 12.6 g/dL (ref 12.0–16.0)
Lymphocyte #: 1 10*3/uL (ref 1.0–3.6)
Lymphocyte %: 8.2 %
MCH: 31.9 pg (ref 26.0–34.0)
MCHC: 33.1 g/dL (ref 32.0–36.0)
MCV: 96 fL (ref 80–100)
Monocyte #: 0.7 x10 3/mm (ref 0.2–0.9)
Monocyte %: 5.3 %
Neutrophil #: 10.6 10*3/uL — ABNORMAL HIGH (ref 1.4–6.5)
Neutrophil %: 86.1 %
Platelet: 281 10*3/uL (ref 150–440)
RBC: 3.94 10*6/uL (ref 3.80–5.20)
RDW: 12.3 % (ref 11.5–14.5)
WBC: 12.4 10*3/uL — ABNORMAL HIGH (ref 3.6–11.0)

## 2011-05-19 LAB — COMPREHENSIVE METABOLIC PANEL
Albumin: 2.7 g/dL — ABNORMAL LOW (ref 3.4–5.0)
Alkaline Phosphatase: 81 U/L (ref 50–136)
Anion Gap: 8 (ref 7–16)
BUN: 10 mg/dL (ref 7–18)
Bilirubin,Total: 0.6 mg/dL (ref 0.2–1.0)
Chloride: 109 mmol/L — ABNORMAL HIGH (ref 98–107)
Co2: 24 mmol/L (ref 21–32)
EGFR (African American): >60
EGFR (Non-African Amer.): >60
Glucose: 101 mg/dL — ABNORMAL HIGH (ref 65–99)
Osmolality: 280 (ref 275–301)
Potassium: 3.9 mmol/L (ref 3.5–5.1)
SGOT(AST): 38 U/L — ABNORMAL HIGH (ref 15–37)
SGPT (ALT): 30 U/L
Total Protein: 6.4 g/dL (ref 6.4–8.2)

## 2011-05-19 LAB — TSH: Thyroid Stimulating Horm: 1.69 u[IU]/mL

## 2011-05-20 LAB — URINE CULTURE

## 2011-05-23 DIAGNOSIS — R5383 Other fatigue: Secondary | ICD-10-CM | POA: Diagnosis not present

## 2011-05-23 DIAGNOSIS — R5381 Other malaise: Secondary | ICD-10-CM | POA: Diagnosis not present

## 2011-05-30 ENCOUNTER — Ambulatory Visit (INDEPENDENT_AMBULATORY_CARE_PROVIDER_SITE_OTHER): Payer: Medicare Other | Admitting: Internal Medicine

## 2011-05-30 ENCOUNTER — Encounter: Payer: Self-pay | Admitting: Internal Medicine

## 2011-05-30 ENCOUNTER — Other Ambulatory Visit: Payer: Self-pay | Admitting: *Deleted

## 2011-05-30 VITALS — BP 120/80 | HR 70 | Temp 98.2°F | Ht 63.0 in | Wt 121.5 lb

## 2011-05-30 DIAGNOSIS — E039 Hypothyroidism, unspecified: Secondary | ICD-10-CM | POA: Diagnosis not present

## 2011-05-30 DIAGNOSIS — R5383 Other fatigue: Secondary | ICD-10-CM

## 2011-05-30 DIAGNOSIS — D649 Anemia, unspecified: Secondary | ICD-10-CM

## 2011-05-30 DIAGNOSIS — R5381 Other malaise: Secondary | ICD-10-CM | POA: Diagnosis not present

## 2011-05-30 DIAGNOSIS — D510 Vitamin B12 deficiency anemia due to intrinsic factor deficiency: Secondary | ICD-10-CM | POA: Diagnosis not present

## 2011-05-30 DIAGNOSIS — M1990 Unspecified osteoarthritis, unspecified site: Secondary | ICD-10-CM

## 2011-05-30 DIAGNOSIS — N390 Urinary tract infection, site not specified: Secondary | ICD-10-CM

## 2011-05-30 DIAGNOSIS — R109 Unspecified abdominal pain: Secondary | ICD-10-CM | POA: Diagnosis not present

## 2011-05-30 DIAGNOSIS — R531 Weakness: Secondary | ICD-10-CM | POA: Insufficient documentation

## 2011-05-30 LAB — COMPREHENSIVE METABOLIC PANEL
ALT: 16 U/L (ref 0–35)
AST: 31 U/L (ref 0–37)
Alkaline Phosphatase: 50 U/L (ref 39–117)
Creatinine, Ser: 0.7 mg/dL (ref 0.4–1.2)
Sodium: 137 mEq/L (ref 135–145)
Total Bilirubin: 0.5 mg/dL (ref 0.3–1.2)

## 2011-05-30 LAB — CBC WITH DIFFERENTIAL/PLATELET
Basophils Absolute: 0 10*3/uL (ref 0.0–0.1)
Eosinophils Absolute: 0.2 10*3/uL (ref 0.0–0.7)
HCT: 41 % (ref 36.0–46.0)
Hemoglobin: 13.7 g/dL (ref 12.0–15.0)
Lymphs Abs: 1.4 10*3/uL (ref 0.7–4.0)
MCHC: 33.3 g/dL (ref 30.0–36.0)
Neutro Abs: 5.7 10*3/uL (ref 1.4–7.7)
Platelets: 268 10*3/uL (ref 150.0–400.0)
RDW: 12.9 % (ref 11.5–14.6)

## 2011-05-30 LAB — SEDIMENTATION RATE: Sed Rate: 29 mm/hr — ABNORMAL HIGH (ref 0–22)

## 2011-05-30 MED ORDER — HYDROCODONE-ACETAMINOPHEN 5-500 MG PO TABS: 1.0000 | ORAL_TABLET | ORAL | Status: DC | PRN

## 2011-05-30 NOTE — Assessment & Plan Note (Signed)
Likely secondary to recent urinary tract infection and hospitalization. Exam is nonfocal today. Will check CBC, CMP, TSH, B12 with labs today. Will request records from recent hospitalization. Followup one week.

## 2011-05-30 NOTE — Telephone Encounter (Signed)
[facility]

## 2011-05-30 NOTE — Progress Notes (Signed)
Subjective:    Patient ID: Krista Ellis, female    DOB: 01/12/1931, 78 y.o.   MRN: [id number]  HPI 78 year old female with history of hypertension presents for followup. She was recently hospitalized because of generalized weakness. She also had mild diffuse abdominal pain. She reports that lab work and CT of the abdomen were unremarkable. She was diagnosed with urinary tract infection and has been treated for this. She continues on Macrobid which has been prescribed by her urologist. She denies any fever, chills, flank pain. She denies any nausea or vomiting. She denies any change in her bowel habits. She denies any chest pain or shortness of breath. She denies any focal neurologic symptoms, however complains of generalized fatigue and weakness.  Outpatient Encounter Prescriptions as of 05/30/2011  Medication Sig Dispense Refill  . amLODipine (NORVASC) 10 MG tablet Take 10 mg by mouth Daily.      . cilostazol (PLETAL) 100 MG tablet Take 100 mg by mouth 2 (two) times daily.      . cloNIDine (CATAPRES - DOSED IN MG/24 HR) 0.1 mg/24hr patch Place 1 patch (0.1 mg total) onto the skin once a week.  4 patch  11  . levothyroxine (SYNTHROID, LEVOTHROID) 25 MCG tablet Take 25 mcg by mouth daily.      . meloxicam (MOBIC) 7.5 MG tablet Take 1 tablet by mouth Once daily as needed.      . metoprolol (TOPROL-XL) 50 MG 24 hr tablet Take 1 tablet by mouth Twice daily.      Marland Kitchen PLAVIX 75 MG tablet Take 1 tablet by mouth Daily.      . pravastatin (PRAVACHOL) 40 MG tablet Take 1 tablet (40 mg total) by mouth every evening.  90 tablet  3  . DISCONTD: HYDROcodone-acetaminophen (VICODIN) 5-500 MG per tablet Take 1 tablet by mouth every 4 (four) hours as needed.  60 tablet  3  . nitrofurantoin (MACRODANTIN) 100 MG capsule         Review of Systems  Constitutional: Positive for fatigue. Negative for fever, chills, appetite change and unexpected weight change.  HENT: Negative for ear pain, congestion, sore throat,  trouble swallowing, neck pain, voice change and sinus pressure.   Eyes: Negative for visual disturbance.  Respiratory: Negative for cough, shortness of breath, wheezing and stridor.   Cardiovascular: Negative for chest pain, palpitations and leg swelling.  Gastrointestinal: Positive for abdominal pain. Negative for nausea, vomiting, diarrhea, constipation, blood in stool, abdominal distention and anal bleeding.  Genitourinary: Negative for dysuria and flank pain.  Musculoskeletal: Negative for myalgias, arthralgias and gait problem.  Skin: Negative for color change and rash.  Neurological: Positive for weakness. Negative for dizziness and headaches.  Hematological: Negative for adenopathy. Does not bruise/bleed easily.  Psychiatric/Behavioral: Negative for suicidal ideas, sleep disturbance and dysphoric mood. The patient is not nervous/anxious.    BP 120/80  Pulse 70  Temp(Src) 98.2 F (36.8 C) (Oral)  Ht 5\' 3"  (1.6 m)  Wt 121 lb 8 oz (55.112 kg)  BMI 21.52 kg/m2     Objective:   Physical Exam  Constitutional: She is oriented to person, place, and time. She appears well-developed and well-nourished. No distress.  HENT:  Head: Normocephalic and atraumatic.  Right Ear: External ear normal.  Left Ear: External ear normal.  Nose: Nose normal.  Mouth/Throat: Oropharynx is clear and moist. No oropharyngeal exudate.  Eyes: Conjunctivae are normal. Pupils are equal, round, and reactive to light. Right eye exhibits no discharge. Left eye exhibits no  discharge. No scleral icterus.  Neck: Normal range of motion. Neck supple. No tracheal deviation present. No thyromegaly present.  Cardiovascular: Normal rate, regular rhythm, normal heart sounds and intact distal pulses.  Exam reveals no gallop and no friction rub.   No murmur heard. Pulmonary/Chest: Effort normal and breath sounds normal. No respiratory distress. She has no wheezes. She has no rales. She exhibits no tenderness.  Abdominal:  Soft. Bowel sounds are normal. She exhibits no distension and no mass. There is tenderness (diffuse). There is no guarding.  Musculoskeletal: Normal range of motion. She exhibits no edema and no tenderness.  Lymphadenopathy:    She has no cervical adenopathy.  Neurological: She is alert and oriented to person, place, and time. No cranial nerve deficit. She exhibits normal muscle tone. Coordination normal.  Skin: Skin is warm and dry. No rash noted. She is not diaphoretic. No erythema. No pallor.  Psychiatric: She has a normal mood and affect. Her behavior is normal. Judgment and thought content normal.          Assessment & Plan:

## 2011-05-30 NOTE — Assessment & Plan Note (Signed)
Patient has been placed on Macrobid for prophylaxis by her urologist. Question if her current renal function is sufficient for this medication. Will check renal function with labs today.

## 2011-06-03 LAB — HELICOBACTER PYLORI  ANTIBODY, IGM: Helicobacter pylori, IgM: 0.3 U/mL (ref <9.0)

## 2011-06-04 DIAGNOSIS — N3020 Other chronic cystitis without hematuria: Secondary | ICD-10-CM | POA: Diagnosis not present

## 2011-06-05 DIAGNOSIS — N390 Urinary tract infection, site not specified: Secondary | ICD-10-CM | POA: Diagnosis not present

## 2011-06-05 DIAGNOSIS — N3020 Other chronic cystitis without hematuria: Secondary | ICD-10-CM | POA: Diagnosis not present

## 2011-06-25 DIAGNOSIS — I10 Essential (primary) hypertension: Secondary | ICD-10-CM | POA: Diagnosis not present

## 2011-06-25 DIAGNOSIS — G458 Other transient cerebral ischemic attacks and related syndromes: Secondary | ICD-10-CM | POA: Diagnosis not present

## 2011-06-25 DIAGNOSIS — E785 Hyperlipidemia, unspecified: Secondary | ICD-10-CM | POA: Diagnosis not present

## 2011-06-25 DIAGNOSIS — I059 Rheumatic mitral valve disease, unspecified: Secondary | ICD-10-CM | POA: Diagnosis not present

## 2011-07-01 DIAGNOSIS — N3020 Other chronic cystitis without hematuria: Secondary | ICD-10-CM | POA: Diagnosis not present

## 2011-07-15 ENCOUNTER — Telehealth: Payer: Self-pay | Admitting: Internal Medicine

## 2011-07-15 DIAGNOSIS — Z1239 Encounter for other screening for malignant neoplasm of breast: Secondary | ICD-10-CM

## 2011-07-15 NOTE — Telephone Encounter (Signed)
Pt called needs order for mammogram @ [place]

## 2011-07-22 DIAGNOSIS — N3020 Other chronic cystitis without hematuria: Secondary | ICD-10-CM | POA: Diagnosis not present

## 2011-07-22 NOTE — Telephone Encounter (Signed)
I have faxed order over to [facility].

## 2011-07-22 NOTE — Telephone Encounter (Signed)
Printed order once it is signed I will send to [facility].

## 2011-07-30 ENCOUNTER — Other Ambulatory Visit: Payer: Self-pay | Admitting: *Deleted

## 2011-07-30 MED ORDER — MELOXICAM 7.5 MG PO TABS: 7.5000 mg | ORAL_TABLET | Freq: Every day | ORAL | Status: DC

## 2011-08-14 ENCOUNTER — Ambulatory Visit: Payer: Self-pay | Admitting: Internal Medicine

## 2011-08-14 DIAGNOSIS — N6459 Other signs and symptoms in breast: Secondary | ICD-10-CM | POA: Diagnosis not present

## 2011-08-14 DIAGNOSIS — Z1231 Encounter for screening mammogram for malignant neoplasm of breast: Secondary | ICD-10-CM | POA: Diagnosis not present

## 2011-08-18 DIAGNOSIS — R339 Retention of urine, unspecified: Secondary | ICD-10-CM | POA: Diagnosis not present

## 2011-08-18 DIAGNOSIS — N3020 Other chronic cystitis without hematuria: Secondary | ICD-10-CM | POA: Diagnosis not present

## 2011-08-18 DIAGNOSIS — N3941 Urge incontinence: Secondary | ICD-10-CM | POA: Diagnosis not present

## 2011-08-22 ENCOUNTER — Encounter: Payer: Self-pay | Admitting: Internal Medicine

## 2011-08-26 ENCOUNTER — Other Ambulatory Visit: Payer: Self-pay | Admitting: *Deleted

## 2011-08-26 MED ORDER — CLOPIDOGREL BISULFATE 75 MG PO TABS: 75.0000 mg | ORAL_TABLET | Freq: Every day | ORAL | Status: DC

## 2011-08-29 ENCOUNTER — Encounter: Payer: Self-pay | Admitting: Internal Medicine

## 2011-08-29 ENCOUNTER — Ambulatory Visit (INDEPENDENT_AMBULATORY_CARE_PROVIDER_SITE_OTHER): Payer: Medicare Other | Admitting: Internal Medicine

## 2011-08-29 VITALS — BP 140/70 | HR 63 | Temp 98.2°F | Ht 63.0 in | Wt 118.5 lb

## 2011-08-29 DIAGNOSIS — M5430 Sciatica, unspecified side: Secondary | ICD-10-CM | POA: Diagnosis not present

## 2011-08-29 DIAGNOSIS — M5432 Sciatica, left side: Secondary | ICD-10-CM | POA: Insufficient documentation

## 2011-08-29 DIAGNOSIS — N3020 Other chronic cystitis without hematuria: Secondary | ICD-10-CM | POA: Diagnosis not present

## 2011-08-29 DIAGNOSIS — I10 Essential (primary) hypertension: Secondary | ICD-10-CM | POA: Diagnosis not present

## 2011-08-29 DIAGNOSIS — N390 Urinary tract infection, site not specified: Secondary | ICD-10-CM

## 2011-08-29 NOTE — Progress Notes (Signed)
Subjective:    Patient ID: Krista Ellis, female    DOB: 11-13-1930, 78 y.o.   MRN: [id number]  HPI 78 year old female with history of hypertension and hypothyroidism presents for followup. Her primary concern today is a one-week history of left-sided sciatic pain. She reports that symptoms started suddenly last week. She denies any trauma to her leg or back. She reports pain that radiates down from her posterior left hip down the back of her left leg. She denies weakness in her left leg. She tried using Vicodin with no improvement. She reports that yesterday her symptoms began to improve without any intervention. Today her symptoms are much better. However, she would like to potentially establish care with orthopedic physician 4 injections into her sciatic area to help with subsequent episodes of pain.  She also notes that her urinary tract infections have been recurrent. She has been taking an experimental medication from her urologist to help try to eliminate infection. She reports that this pill has caused some nausea. She continues to have mild dysuria. She denies any fever, chills, flank pain.  In regards to her chronic conditions, she reports full compliance with her medications.  Outpatient Encounter Prescriptions as of 08/29/2011  Medication Sig Dispense Refill  . amLODipine (NORVASC) 10 MG tablet Take 10 mg by mouth Daily.      . cilostazol (PLETAL) 100 MG tablet Take 100 mg by mouth 2 (two) times daily.      . cloNIDine (CATAPRES - DOSED IN MG/24 HR) 0.1 mg/24hr patch Place 1 patch (0.1 mg total) onto the skin once a week.  4 patch  11  . clopidogrel (PLAVIX) 75 MG tablet Take 1 tablet (75 mg total) by mouth daily.  90 tablet  3  . HYDROcodone-acetaminophen (VICODIN) 5-500 MG per tablet Take 1 tablet by mouth every 4 (four) hours as needed.  60 tablet  3  . levothyroxine (SYNTHROID, LEVOTHROID) 25 MCG tablet Take 25 mcg by mouth daily.      . meloxicam (MOBIC) 7.5 MG tablet Take 1  tablet (7.5 mg total) by mouth daily.  90 tablet  3  . metoprolol (TOPROL-XL) 50 MG 24 hr tablet Take 1 tablet by mouth Twice daily.      . nitrofurantoin (MACRODANTIN) 100 MG capsule       . pravastatin (PRAVACHOL) 40 MG tablet Take 1 tablet (40 mg total) by mouth every evening.  90 tablet  3   BP 140/70  Pulse 63  Temp 98.2 F (36.8 C) (Oral)  Ht 5\' 3"  (1.6 m)  Wt 118 lb 8 oz (53.751 kg)  BMI 20.99 kg/m2  SpO2 97%  Review of Systems  Constitutional: Negative for fever, chills, appetite change, fatigue and unexpected weight change.  HENT: Negative for ear pain, congestion, sore throat, trouble swallowing, neck pain, voice change and sinus pressure.   Eyes: Negative for visual disturbance.  Respiratory: Negative for cough, shortness of breath, wheezing and stridor.   Cardiovascular: Negative for chest pain, palpitations and leg swelling.  Gastrointestinal: Negative for nausea, vomiting, abdominal pain, diarrhea, constipation, blood in stool, abdominal distention and anal bleeding.  Genitourinary: Negative for dysuria and flank pain.  Musculoskeletal: Positive for myalgias and arthralgias. Negative for gait problem.  Skin: Negative for color change and rash.  Neurological: Negative for dizziness and headaches.  Hematological: Negative for adenopathy. Does not bruise/bleed easily.  Psychiatric/Behavioral: Negative for suicidal ideas, disturbed wake/sleep cycle and dysphoric mood. The patient is not nervous/anxious.  Objective:   Physical Exam  Constitutional: She is oriented to person, place, and time. She appears well-developed and well-nourished. No distress.  HENT:  Head: Normocephalic and atraumatic.  Right Ear: External ear normal.  Left Ear: External ear normal.  Nose: Nose normal.  Mouth/Throat: Oropharynx is clear and moist. No oropharyngeal exudate.  Eyes: Conjunctivae are normal. Pupils are equal, round, and reactive to light. Right eye exhibits no discharge. Left  eye exhibits no discharge. No scleral icterus.  Neck: Normal range of motion. Neck supple. No tracheal deviation present. No thyromegaly present.  Cardiovascular: Normal rate, regular rhythm, normal heart sounds and intact distal pulses.  Exam reveals no gallop and no friction rub.   No murmur heard. Pulmonary/Chest: Effort normal and breath sounds normal. No respiratory distress. She has no wheezes. She has no rales. She exhibits no tenderness.  Musculoskeletal: Normal range of motion. She exhibits no edema and no tenderness.  Lymphadenopathy:    She has no cervical adenopathy.  Neurological: She is alert and oriented to person, place, and time. No cranial nerve deficit. She exhibits normal muscle tone. Coordination normal.  Skin: Skin is warm and dry. No rash noted. She is not diaphoretic. No erythema. No pallor.  Psychiatric: She has a normal mood and affect. Her behavior is normal. Judgment and thought content normal.          Assessment & Plan:

## 2011-08-29 NOTE — Assessment & Plan Note (Signed)
Encouraged her to try taking 2 Vicodin if she develops severe pain. We could also consider starting a prednisone taper. However, she would like to hold off for now. Will set up referral to orthopedics for possible steroid injection into the sciatic area if pain is recurrent.

## 2011-08-29 NOTE — Assessment & Plan Note (Signed)
Blood pressure generally well controlled. Will continue current medications. We'll plan to recheck renal function with labs in October 2013.

## 2011-08-29 NOTE — Assessment & Plan Note (Addendum)
Currently undergoing treatment with methenamine with her urologist. Cystoscopy planned for future. Will get records on recent evaluation.

## 2011-09-01 DIAGNOSIS — N390 Urinary tract infection, site not specified: Secondary | ICD-10-CM | POA: Diagnosis not present

## 2011-09-11 ENCOUNTER — Telehealth: Payer: Self-pay | Admitting: Internal Medicine

## 2011-09-11 DIAGNOSIS — M48062 Spinal stenosis, lumbar region with neurogenic claudication: Secondary | ICD-10-CM | POA: Diagnosis not present

## 2011-09-11 DIAGNOSIS — M47817 Spondylosis without myelopathy or radiculopathy, lumbosacral region: Secondary | ICD-10-CM | POA: Diagnosis not present

## 2011-09-11 DIAGNOSIS — IMO0002 Reserved for concepts with insufficient information to code with codable children: Secondary | ICD-10-CM | POA: Diagnosis not present

## 2011-09-11 NOTE — Telephone Encounter (Signed)
Is it ok for the patient to go off her plavix starting today for 6 days in order for her to get her pain shot. Patient is calling for Dr. Jyl Heinz office .

## 2011-09-11 NOTE — Telephone Encounter (Signed)
I think she should get clearance from her cardiologist for this.

## 2011-09-11 NOTE — Telephone Encounter (Signed)
Left message on machine at home for patient return call.

## 2011-09-11 NOTE — Telephone Encounter (Signed)
Patient advised as instructed via telephone.  She stated that she will call [facility] cardiology to set up an appt.

## 2011-09-15 ENCOUNTER — Telehealth: Payer: Self-pay | Admitting: Internal Medicine

## 2011-09-15 NOTE — Telephone Encounter (Signed)
Sue Lush advised instructed via telephone, she will call patient and let her know.

## 2011-09-15 NOTE — Telephone Encounter (Signed)
Krista Ellis with Dr Karl Ito @ [facility] wanted to do epideral injection in her back and wanted to make sure it is ok if Krista Ellis goes off her plavax 6 days prior to procedure

## 2011-09-15 NOTE — Telephone Encounter (Signed)
I spoke with patient this morning and she stated that she just had a follow up visit with Faylene Million with Dr. Gwen Pounds a few months ago and they don't feel like she needs another appt before her epidural.  Patient stated that she has been off of Plavix prior to her last injection and she did fine.

## 2011-09-15 NOTE — Telephone Encounter (Signed)
OK. Then, fine to stop Plavix x 1 week for injection.

## 2011-09-17 ENCOUNTER — Other Ambulatory Visit: Payer: Self-pay | Admitting: *Deleted

## 2011-09-17 MED ORDER — LEVOTHYROXINE SODIUM 25 MCG PO TABS: 25.0000 ug | ORAL_TABLET | Freq: Every day | ORAL | Status: DC

## 2011-09-24 DIAGNOSIS — IMO0002 Reserved for concepts with insufficient information to code with codable children: Secondary | ICD-10-CM | POA: Diagnosis not present

## 2011-09-24 DIAGNOSIS — M47817 Spondylosis without myelopathy or radiculopathy, lumbosacral region: Secondary | ICD-10-CM | POA: Diagnosis not present

## 2011-09-24 DIAGNOSIS — M48062 Spinal stenosis, lumbar region with neurogenic claudication: Secondary | ICD-10-CM | POA: Diagnosis not present

## 2011-09-29 DIAGNOSIS — N3020 Other chronic cystitis without hematuria: Secondary | ICD-10-CM | POA: Insufficient documentation

## 2011-09-29 DIAGNOSIS — N3941 Urge incontinence: Secondary | ICD-10-CM | POA: Insufficient documentation

## 2011-09-29 DIAGNOSIS — R3911 Hesitancy of micturition: Secondary | ICD-10-CM | POA: Insufficient documentation

## 2011-09-29 DIAGNOSIS — R339 Retention of urine, unspecified: Secondary | ICD-10-CM | POA: Insufficient documentation

## 2011-10-01 ENCOUNTER — Encounter: Payer: Self-pay | Admitting: Internal Medicine

## 2011-10-01 ENCOUNTER — Ambulatory Visit (INDEPENDENT_AMBULATORY_CARE_PROVIDER_SITE_OTHER): Payer: Medicare Other | Admitting: Internal Medicine

## 2011-10-01 VITALS — BP 132/70 | HR 57 | Temp 98.3°F | Ht 63.0 in | Wt 119.0 lb

## 2011-10-01 DIAGNOSIS — Z0000 Encounter for general adult medical examination without abnormal findings: Secondary | ICD-10-CM | POA: Insufficient documentation

## 2011-10-01 DIAGNOSIS — E785 Hyperlipidemia, unspecified: Secondary | ICD-10-CM | POA: Insufficient documentation

## 2011-10-01 NOTE — Progress Notes (Signed)
Subjective:    Patient ID: Krista Ellis, female    DOB: 04-29-1930, 78 y.o.   MRN: [id number]  HPI The patient is here for annual Medicare wellness examination and management of other chronic and acute problems.   The risk factors are reflected in the social history.  The roster of all physicians providing medical care to patient - is listed in the Snapshot section of the chart.  Activities of daily living:  The patient is 100% independent in all ADLs: dressing, toileting, feeding as well as independent mobility  Home safety : The patient has smoke detectors in the home. They wear seatbelts.  There are no firearms at home. There is no violence in the home.  Lives alone for last 22 years.  There is no risks for hepatitis, STDs or HIV. There is no history of blood transfusion. They have no travel history to infectious disease endemic areas of the world.  The patient has seen their dentist in the last six month. (Dr. Nancy Marus) They have seen their eye doctor in the last year. (Dr. Dorcas Mcmurray) No hearing issues. They have deferred audiologic testing in the last year.    They do not  have excessive sun exposure. Discussed the need for sun protection: hats, long sleeves and use of sunscreen if there is significant sun exposure.   Diet: the importance of a healthy diet is discussed. They do have a healthy diet.  The benefits of regular aerobic exercise were discussed. She does exercises for legs, Rx by PT. Limited walking because of back pain.  Depression screen: there are no signs or vegative symptoms of depression- irritability, change in appetite, anhedonia, sadness/tearfullness.  Cognitive assessment: the patient manages all their financial and personal affairs and is actively engaged. They could relate day,date,year and events.Manages finances independently.  HCPOA - [place], son-in-law.  The following portions of the patient's history were reviewed and updated as appropriate:  allergies, current medications, past family history, past medical history,  past surgical history, past social history  and problem list.  Visual acuity was not assessed per patient preference since she has regular follow up with her ophthalmologist. Hearing and body mass index were assessed and reviewed.   During the course of the visit the patient was educated and counseled about appropriate screening and preventive services including : fall prevention , diabetes screening, nutrition counseling, colorectal cancer screening, and recommended immunizations.    Outpatient Encounter Prescriptions as of 10/01/2011  Medication Sig Dispense Refill  . amLODipine (NORVASC) 10 MG tablet Take 10 mg by mouth Daily.      . cilostazol (PLETAL) 100 MG tablet Take 100 mg by mouth 2 (two) times daily.      . cloNIDine (CATAPRES - DOSED IN MG/24 HR) 0.1 mg/24hr patch Place 1 patch (0.1 mg total) onto the skin once a week.  4 patch  11  . clopidogrel (PLAVIX) 75 MG tablet Take 1 tablet (75 mg total) by mouth daily.  90 tablet  3  . HYDROcodone-acetaminophen (VICODIN) 5-500 MG per tablet Take 1 tablet by mouth every 4 (four) hours as needed.  60 tablet  3  . levothyroxine (SYNTHROID, LEVOTHROID) 25 MCG tablet Take 1 tablet (25 mcg total) by mouth daily.  30 tablet  5  . meloxicam (MOBIC) 7.5 MG tablet Take 1 tablet (7.5 mg total) by mouth daily.  90 tablet  3  . metoprolol (TOPROL-XL) 50 MG 24 hr tablet Take 1 tablet by mouth Twice daily.      Marland Kitchen  nitrofurantoin (MACRODANTIN) 100 MG capsule       . pravastatin (PRAVACHOL) 40 MG tablet Take 1 tablet (40 mg total) by mouth every evening.  90 tablet  3    BP 132/70  Pulse 57  Temp 98.3 F (36.8 C) (Oral)  Ht 5\' 3"  (1.6 m)  Wt 119 lb (53.978 kg)  BMI 21.08 kg/m2  SpO2 97%  Review of Systems  Constitutional: Negative for fever, chills, appetite change, fatigue and unexpected weight change.  HENT: Negative for ear pain, congestion, sore throat, trouble  swallowing, neck pain, voice change and sinus pressure.   Eyes: Negative for visual disturbance.  Respiratory: Negative for cough, shortness of breath, wheezing and stridor.   Cardiovascular: Negative for chest pain, palpitations and leg swelling.  Gastrointestinal: Negative for nausea, vomiting, abdominal pain, diarrhea, constipation, blood in stool, abdominal distention and anal bleeding.  Genitourinary: Negative for dysuria and flank pain.  Musculoskeletal: Negative for myalgias, arthralgias and gait problem.  Skin: Negative for color change and rash.  Neurological: Negative for dizziness and headaches.  Hematological: Negative for adenopathy. Does not bruise/bleed easily.  Psychiatric/Behavioral: Negative for suicidal ideas, disturbed wake/sleep cycle and dysphoric mood. The patient is not nervous/anxious.        Objective:   Physical Exam  Constitutional: She is oriented to person, place, and time. She appears well-developed and well-nourished. No distress.  HENT:  Head: Normocephalic and atraumatic.  Right Ear: External ear normal.  Left Ear: External ear normal.  Nose: Nose normal.  Mouth/Throat: Oropharynx is clear and moist. No oropharyngeal exudate.  Eyes: Conjunctivae normal are normal. Pupils are equal, round, and reactive to light. Right eye exhibits no discharge. Left eye exhibits no discharge. No scleral icterus.  Neck: Normal range of motion. Neck supple. No tracheal deviation present. No thyromegaly present.  Cardiovascular: Normal rate, regular rhythm, normal heart sounds and intact distal pulses.  Exam reveals no gallop and no friction rub.   No murmur heard. Pulmonary/Chest: Effort normal and breath sounds normal. No respiratory distress. She has no wheezes. She has no rales. She exhibits no tenderness.  Abdominal: Soft. Bowel sounds are normal. She exhibits no distension and no mass. There is no tenderness. There is no guarding.  Musculoskeletal: Normal range of  motion. She exhibits no edema and no tenderness.  Lymphadenopathy:    She has no cervical adenopathy.  Neurological: She is alert and oriented to person, place, and time. No cranial nerve deficit. She exhibits normal muscle tone. Coordination normal.  Skin: Skin is warm and dry. No rash noted. She is not diaphoretic. No erythema. No pallor.  Psychiatric: She has a normal mood and affect. Her behavior is normal. Judgment and thought content normal.          Assessment & Plan:

## 2011-10-01 NOTE — Assessment & Plan Note (Signed)
General exam normal today. Health maintenance is up to date including vaccinations. Patient has never had a colonoscopy but does not wish to have colonoscopy at this point. She is up-to-date on mammogram. Will obtain records on bone density testing. Appropriate screening performed today. Patient will bring a copy of her health care POA. Followup in 6 months or sooner as needed.

## 2011-10-02 LAB — LIPID PANEL
Cholesterol: 142 mg/dL (ref 0–200)
HDL: 55.4 mg/dL (ref >39.00)
LDL Cholesterol: 73 mg/dL (ref 0–99)
Total CHOL/HDL Ratio: 3
Triglycerides: 66 mg/dL (ref 0.0–149.0)
VLDL: 13.2 mg/dL (ref 0.0–40.0)

## 2011-10-02 LAB — COMPREHENSIVE METABOLIC PANEL
AST: 26 U/L (ref 0–37)
Alkaline Phosphatase: 45 U/L (ref 39–117)
BUN: 19 mg/dL (ref 6–23)
Creatinine, Ser: 0.7 mg/dL (ref 0.4–1.2)
Glucose, Bld: 88 mg/dL (ref 70–99)
Total Bilirubin: 0.6 mg/dL (ref 0.3–1.2)

## 2011-10-06 DIAGNOSIS — M47817 Spondylosis without myelopathy or radiculopathy, lumbosacral region: Secondary | ICD-10-CM | POA: Diagnosis not present

## 2011-10-06 DIAGNOSIS — M48062 Spinal stenosis, lumbar region with neurogenic claudication: Secondary | ICD-10-CM | POA: Diagnosis not present

## 2011-10-06 DIAGNOSIS — IMO0002 Reserved for concepts with insufficient information to code with codable children: Secondary | ICD-10-CM | POA: Diagnosis not present

## 2011-10-16 DIAGNOSIS — M25569 Pain in unspecified knee: Secondary | ICD-10-CM | POA: Diagnosis not present

## 2011-10-16 DIAGNOSIS — M1710 Unilateral primary osteoarthritis, unspecified knee: Secondary | ICD-10-CM | POA: Diagnosis not present

## 2011-10-16 DIAGNOSIS — M5137 Other intervertebral disc degeneration, lumbosacral region: Secondary | ICD-10-CM | POA: Diagnosis not present

## 2011-11-28 ENCOUNTER — Other Ambulatory Visit: Payer: Self-pay | Admitting: *Deleted

## 2011-11-28 DIAGNOSIS — E785 Hyperlipidemia, unspecified: Secondary | ICD-10-CM

## 2011-11-28 MED ORDER — PRAVASTATIN SODIUM 40 MG PO TABS: 40.0000 mg | ORAL_TABLET | Freq: Every evening | ORAL | Status: DC

## 2011-12-03 DIAGNOSIS — N3941 Urge incontinence: Secondary | ICD-10-CM | POA: Diagnosis not present

## 2011-12-03 DIAGNOSIS — R339 Retention of urine, unspecified: Secondary | ICD-10-CM | POA: Diagnosis not present

## 2011-12-03 DIAGNOSIS — N3020 Other chronic cystitis without hematuria: Secondary | ICD-10-CM | POA: Diagnosis not present

## 2011-12-03 DIAGNOSIS — R3989 Other symptoms and signs involving the genitourinary system: Secondary | ICD-10-CM | POA: Diagnosis not present

## 2011-12-03 DIAGNOSIS — N390 Urinary tract infection, site not specified: Secondary | ICD-10-CM | POA: Diagnosis not present

## 2011-12-04 DIAGNOSIS — R399 Unspecified symptoms and signs involving the genitourinary system: Secondary | ICD-10-CM | POA: Insufficient documentation

## 2011-12-25 DIAGNOSIS — K5730 Diverticulosis of large intestine without perforation or abscess without bleeding: Secondary | ICD-10-CM | POA: Insufficient documentation

## 2011-12-25 DIAGNOSIS — R3129 Other microscopic hematuria: Secondary | ICD-10-CM | POA: Diagnosis not present

## 2011-12-25 DIAGNOSIS — N3020 Other chronic cystitis without hematuria: Secondary | ICD-10-CM | POA: Diagnosis not present

## 2011-12-25 DIAGNOSIS — D414 Neoplasm of uncertain behavior of bladder: Secondary | ICD-10-CM | POA: Insufficient documentation

## 2012-01-01 DIAGNOSIS — I059 Rheumatic mitral valve disease, unspecified: Secondary | ICD-10-CM | POA: Diagnosis not present

## 2012-01-01 DIAGNOSIS — E785 Hyperlipidemia, unspecified: Secondary | ICD-10-CM | POA: Diagnosis not present

## 2012-01-01 DIAGNOSIS — I10 Essential (primary) hypertension: Secondary | ICD-10-CM | POA: Diagnosis not present

## 2012-01-01 DIAGNOSIS — I2510 Atherosclerotic heart disease of native coronary artery without angina pectoris: Secondary | ICD-10-CM | POA: Diagnosis not present

## 2012-01-05 ENCOUNTER — Telehealth: Payer: Self-pay | Admitting: Internal Medicine

## 2012-01-05 NOTE — Telephone Encounter (Signed)
Can you please discuss with pt to figure out why she needs this referral?

## 2012-01-05 NOTE — Telephone Encounter (Signed)
Patient wants a referral to a hematologist.

## 2012-01-06 NOTE — Telephone Encounter (Signed)
Can you please schedule an appt for this pt.

## 2012-01-06 NOTE — Telephone Encounter (Signed)
This pt will have to set up a visit to discuss. I do not see indication for referral.

## 2012-01-06 NOTE — Telephone Encounter (Signed)
States that she sees Dr. Benna Dunks in [place] for her legs. Pt states that she has been on Plavix for over 20 years due to an issue she had with her blood being to thick. States now that her blood is very thin. Pts  Daughter would like mother to go to the hematologist with her to see if this is a possibly genetic disorder (thick blood) and if her mother still needs to be on the plavix.   Pt would like to go to same provider as Daughter. Will call back this pm with the providers name.

## 2012-01-07 NOTE — Telephone Encounter (Signed)
Scheduled

## 2012-01-12 ENCOUNTER — Ambulatory Visit: Payer: Medicare Other | Admitting: Internal Medicine

## 2012-01-15 ENCOUNTER — Ambulatory Visit: Payer: Medicare Other | Admitting: Internal Medicine

## 2012-01-22 ENCOUNTER — Other Ambulatory Visit: Payer: Self-pay | Admitting: Internal Medicine

## 2012-01-22 DIAGNOSIS — M1990 Unspecified osteoarthritis, unspecified site: Secondary | ICD-10-CM

## 2012-01-22 NOTE — Telephone Encounter (Signed)
Hydrocodon- Acetaminophen 5-500  Take 1 tablet every 4 hours as needed

## 2012-01-26 ENCOUNTER — Ambulatory Visit (INDEPENDENT_AMBULATORY_CARE_PROVIDER_SITE_OTHER): Payer: Medicare Other | Admitting: Internal Medicine

## 2012-01-26 ENCOUNTER — Encounter: Payer: Self-pay | Admitting: Internal Medicine

## 2012-01-26 VITALS — BP 120/70 | HR 63 | Temp 98.7°F | Ht 63.0 in | Wt 113.5 lb

## 2012-01-26 DIAGNOSIS — M1990 Unspecified osteoarthritis, unspecified site: Secondary | ICD-10-CM | POA: Diagnosis not present

## 2012-01-26 DIAGNOSIS — Z23 Encounter for immunization: Secondary | ICD-10-CM

## 2012-01-26 DIAGNOSIS — D6859 Other primary thrombophilia: Secondary | ICD-10-CM | POA: Diagnosis not present

## 2012-01-26 MED ORDER — HYDROCODONE-ACETAMINOPHEN 5-500 MG PO TABS: 1.0000 | ORAL_TABLET | ORAL | Status: DC | PRN

## 2012-01-26 NOTE — Telephone Encounter (Signed)
Pt last seen on 9/11 and med last filled on 05/30/11. Ok to refill?

## 2012-01-26 NOTE — Progress Notes (Signed)
Subjective:    Patient ID: Krista Ellis, female    DOB: 09/30/1930, 79 y.o.   MRN: [id number]  HPI 79 year old female with history of hypertension, hyperlipidemia, peripheral vascular disease, stroke presents for a visit with her daughter to discuss possibility of hypercoagulable state. She reports that she was started on anticoagulation over 20 years ago by a physician who told her that her blood was "too thick."  She is unsure what this medication was, but thinks it was Plavix.  She has h/o phlebitis in her lower leg many years ago, but never had DVT or PE.  She does have h/o PVD and stroke and has been on Plavix for years.  She was concerned about increased bruising over her arms and her daughter thought she should possibly see a hematologist to evaluate.  She has not had any GI bleeding or other blood loss recently, but did have GI bleeding related to diverticulitis in distant past.  In regards to chronic medical issues, she reports she is doing well. She reports full compliance with her medications. She reports that symptoms of arthritis pain have been well controlled with meloxicam and occasional use of Vicodin.  Outpatient Encounter Prescriptions as of 01/26/2012  Medication Sig Dispense Refill  . amLODipine (NORVASC) 10 MG tablet Take 10 mg by mouth Daily.      . cloNIDine (CATAPRES - DOSED IN MG/24 HR) 0.1 mg/24hr patch Place 1 patch (0.1 mg total) onto the skin once a week.  4 patch  11  . clopidogrel (PLAVIX) 75 MG tablet Take 1 tablet (75 mg total) by mouth daily.  90 tablet  3  . HYDROcodone-acetaminophen (VICODIN) 5-500 MG per tablet Take 1 tablet by mouth every 4 (four) hours as needed.  60 tablet  3  . levothyroxine (SYNTHROID, LEVOTHROID) 25 MCG tablet Take 1 tablet (25 mcg total) by mouth daily.  30 tablet  5  . meloxicam (MOBIC) 7.5 MG tablet Take 1 tablet (7.5 mg total) by mouth daily.  90 tablet  3  . metoprolol (TOPROL-XL) 50 MG 24 hr tablet Take 1 tablet by mouth Twice  daily.      . nitrofurantoin (MACRODANTIN) 100 MG capsule       . pravastatin (PRAVACHOL) 40 MG tablet Take 1 tablet (40 mg total) by mouth every evening.  90 tablet  3   BP 120/70  Pulse 63  Temp 98.7 F (37.1 C) (Oral)  Ht 5\' 3"  (1.6 m)  Wt 113 lb 8 oz (51.483 kg)  BMI 20.11 kg/m2  SpO2 97%  Review of Systems  Constitutional: Negative for fever, chills, appetite change, fatigue and unexpected weight change.  HENT: Negative for ear pain, congestion, sore throat, trouble swallowing, neck pain, voice change and sinus pressure.   Eyes: Negative for visual disturbance.  Respiratory: Negative for cough, shortness of breath, wheezing and stridor.   Cardiovascular: Negative for chest pain, palpitations and leg swelling.  Gastrointestinal: Negative for nausea, vomiting, abdominal pain, diarrhea, constipation, blood in stool, abdominal distention and anal bleeding.  Genitourinary: Negative for dysuria and flank pain.  Musculoskeletal: Negative for myalgias, arthralgias and gait problem.  Skin: Negative for color change and rash.  Neurological: Negative for dizziness and headaches.  Hematological: Negative for adenopathy. Bruises/bleeds easily.  Psychiatric/Behavioral: Negative for suicidal ideas, sleep disturbance and dysphoric mood. The patient is not nervous/anxious.        Objective:   Physical Exam  Constitutional: She is oriented to person, place, and time. She appears well-developed and  well-nourished. No distress.  HENT:  Head: Normocephalic and atraumatic.  Right Ear: External ear normal.  Left Ear: External ear normal.  Nose: Nose normal.  Mouth/Throat: Oropharynx is clear and moist. No oropharyngeal exudate.  Eyes: Conjunctivae normal are normal. Pupils are equal, round, and reactive to light. Right eye exhibits no discharge. Left eye exhibits no discharge. No scleral icterus.  Neck: Normal range of motion. Neck supple. No tracheal deviation present. No thyromegaly present.    Cardiovascular: Normal rate, regular rhythm and intact distal pulses.  Exam reveals no gallop and no friction rub.   Murmur heard. Pulmonary/Chest: Effort normal and breath sounds normal. No respiratory distress. She has no wheezes. She has no rales. She exhibits no tenderness.  Musculoskeletal: Normal range of motion. She exhibits no edema and no tenderness.  Lymphadenopathy:    She has no cervical adenopathy.  Neurological: She is alert and oriented to person, place, and time. No cranial nerve deficit. She exhibits normal muscle tone. Coordination normal.  Skin: Skin is warm and dry. Ecchymosis noted. No rash noted. She is not diaphoretic. No erythema. No pallor.     Psychiatric: She has a normal mood and affect. Her behavior is normal. Judgment and thought content normal.          Assessment & Plan:

## 2012-01-26 NOTE — Assessment & Plan Note (Signed)
Patient reports that she was placed on blood thinners in the past because of possible hypercoagulable state. I cannot find any record of this in her previous medical notes. Seems unlikely has no history of DVT/PE or h/o blood clots during pregnancy. We discussed that she is taking Plavix because of a history of peripheral vascular disease and stroke. Will plan to continue Plavix. In the future if she would like hematology evaluation for thrombophilic state, she will call and we'll set up referral.

## 2012-01-26 NOTE — Progress Notes (Signed)
Rx for Hydrocodone called to [facility].

## 2012-01-26 NOTE — Assessment & Plan Note (Signed)
Patient with long history of diffuse osteoarthritis. Symptoms well controlled with meloxicam and occasional use of Vicodin. Will continue.

## 2012-01-29 ENCOUNTER — Encounter: Payer: Self-pay | Admitting: Internal Medicine

## 2012-02-05 DIAGNOSIS — N390 Urinary tract infection, site not specified: Secondary | ICD-10-CM | POA: Diagnosis not present

## 2012-02-05 DIAGNOSIS — N3020 Other chronic cystitis without hematuria: Secondary | ICD-10-CM | POA: Diagnosis not present

## 2012-02-23 ENCOUNTER — Other Ambulatory Visit: Payer: Self-pay | Admitting: *Deleted

## 2012-02-23 DIAGNOSIS — R1084 Generalized abdominal pain: Secondary | ICD-10-CM | POA: Diagnosis not present

## 2012-02-23 DIAGNOSIS — N3289 Other specified disorders of bladder: Secondary | ICD-10-CM | POA: Diagnosis not present

## 2012-02-23 DIAGNOSIS — R918 Other nonspecific abnormal finding of lung field: Secondary | ICD-10-CM | POA: Diagnosis not present

## 2012-02-23 DIAGNOSIS — Z96649 Presence of unspecified artificial hip joint: Secondary | ICD-10-CM | POA: Diagnosis not present

## 2012-02-23 DIAGNOSIS — R109 Unspecified abdominal pain: Secondary | ICD-10-CM | POA: Diagnosis not present

## 2012-02-23 DIAGNOSIS — I10 Essential (primary) hypertension: Secondary | ICD-10-CM

## 2012-02-23 MED ORDER — CLONIDINE HCL 0.1 MG/24HR TD PTWK: 1.0000 | MEDICATED_PATCH | TRANSDERMAL | Status: DC

## 2012-03-01 ENCOUNTER — Telehealth: Payer: Self-pay | Admitting: *Deleted

## 2012-03-01 NOTE — Telephone Encounter (Signed)
Patient called and left message requesting information about getting handicap form filled out.

## 2012-03-10 ENCOUNTER — Telehealth: Payer: Self-pay | Admitting: Internal Medicine

## 2012-03-10 NOTE — Telephone Encounter (Signed)
Msg sent to pt to discuss adding ACEi

## 2012-03-12 DIAGNOSIS — D414 Neoplasm of uncertain behavior of bladder: Secondary | ICD-10-CM | POA: Diagnosis not present

## 2012-03-18 ENCOUNTER — Other Ambulatory Visit: Payer: Self-pay | Admitting: Internal Medicine

## 2012-03-23 ENCOUNTER — Ambulatory Visit: Payer: Self-pay | Admitting: Hematology and Oncology

## 2012-03-23 DIAGNOSIS — R918 Other nonspecific abnormal finding of lung field: Secondary | ICD-10-CM | POA: Diagnosis not present

## 2012-03-31 DIAGNOSIS — I69998 Other sequelae following unspecified cerebrovascular disease: Secondary | ICD-10-CM | POA: Diagnosis not present

## 2012-04-02 ENCOUNTER — Telehealth: Payer: Self-pay | Admitting: *Deleted

## 2012-04-02 DIAGNOSIS — I10 Essential (primary) hypertension: Secondary | ICD-10-CM

## 2012-04-02 MED ORDER — CLONIDINE HCL 0.1 MG/24HR TD PTWK: 1.0000 | MEDICATED_PATCH | TRANSDERMAL | Status: DC

## 2012-04-02 NOTE — Telephone Encounter (Signed)
Rx sent pharmacy  

## 2012-04-02 NOTE — Addendum Note (Signed)
Addended by: Theola Sequin on: 04/02/2012 02:35 PM   Modules accepted: Orders

## 2012-04-02 NOTE — Telephone Encounter (Signed)
Patient called requesting a refill on clonidine patch

## 2012-04-08 ENCOUNTER — Telehealth: Payer: Self-pay | Admitting: Internal Medicine

## 2012-04-08 NOTE — Telephone Encounter (Signed)
Pt daughter called stated that ms Baine called last Friday need dr walker to calling dr Delanna Ahmadi @ [facility] stating it was ok to go of plavix for 7 days to get pain shot.  Dr Delanna Ahmadi office called canceled her appointment because they have not heard from dr walker they would reschedule  Her appointment when they hear from dr walker Please advise pt on what do to

## 2012-04-09 NOTE — Telephone Encounter (Signed)
Dr. Rhea Bleacher did not give her the pain shot, they are waiting for Dr. Dan Humphreys to call and say she can be off of her Plavix for 7 days. They will not do the injection without Dr. Dan Humphreys approval to be off Plavix.

## 2012-04-09 NOTE — Telephone Encounter (Signed)
Left message to call back  

## 2012-04-09 NOTE — Telephone Encounter (Signed)
It is fine with me for her to stop the Plavix for 5 days. He should check with her cardiologist however.

## 2012-04-12 ENCOUNTER — Telehealth: Payer: Self-pay | Admitting: Internal Medicine

## 2012-04-12 NOTE — Telephone Encounter (Signed)
Pt states she left a vm 1.5 week ago regarding getting a "pain shot" with Dr. Vicki Mallet (ph: [phone]/[facility]) and was to be off her Plavix x7 days.  Pt states no one ever called her back so she was unable to get the "pain shot".  Pt is asking that someone to Dr. Vicki Mallet and confirm that she can be off the plavix x7 days to get the shot.

## 2012-04-12 NOTE — Telephone Encounter (Signed)
Spoke with patient, informed her it was ok but call her cardiologist for approval. Patient stated she would call them.

## 2012-04-12 NOTE — Telephone Encounter (Signed)
Pt left message on voice mail wanting dr walkers nurse to call her back.  She stated dr Gerlene Burdock phone number [phone] fax # [phone].  She was still asking about going off her plavax

## 2012-04-12 NOTE — Telephone Encounter (Signed)
Spoke with patient and this has been addressed in another encounter. Please see previous encounter for further information

## 2012-04-13 NOTE — Telephone Encounter (Signed)
Patient called the cardiologist and they informed her that Dr. Dan Humphreys has been regulating her Plavix for years and they will not tell her weather she should stay off her Plavixfor 7 days in order for her to have her pain shot.  She is wanting Dr. Dan Humphreys to give the ok . She is in a lot of pain and they need a reply from the doctor. There fax number is [phone].

## 2012-04-14 NOTE — Telephone Encounter (Signed)
Information printed and faxed to Dr. Sheila Oats office and will call again today.

## 2012-04-14 NOTE — Telephone Encounter (Signed)
Called their office and spoke to [facility], information was given to her and also informed her that I faxed information yesterday. She agreed and stated ok.

## 2012-04-20 ENCOUNTER — Ambulatory Visit: Payer: Self-pay | Admitting: Hematology and Oncology

## 2012-04-21 DIAGNOSIS — M47817 Spondylosis without myelopathy or radiculopathy, lumbosacral region: Secondary | ICD-10-CM | POA: Diagnosis not present

## 2012-04-21 DIAGNOSIS — M48062 Spinal stenosis, lumbar region with neurogenic claudication: Secondary | ICD-10-CM | POA: Diagnosis not present

## 2012-04-21 DIAGNOSIS — IMO0002 Reserved for concepts with insufficient information to code with codable children: Secondary | ICD-10-CM | POA: Diagnosis not present

## 2012-04-28 DIAGNOSIS — M19079 Primary osteoarthritis, unspecified ankle and foot: Secondary | ICD-10-CM | POA: Diagnosis not present

## 2012-05-04 DIAGNOSIS — IMO0002 Reserved for concepts with insufficient information to code with codable children: Secondary | ICD-10-CM | POA: Diagnosis not present

## 2012-05-04 DIAGNOSIS — M48062 Spinal stenosis, lumbar region with neurogenic claudication: Secondary | ICD-10-CM | POA: Diagnosis not present

## 2012-05-04 DIAGNOSIS — M47817 Spondylosis without myelopathy or radiculopathy, lumbosacral region: Secondary | ICD-10-CM | POA: Diagnosis not present

## 2012-05-26 DIAGNOSIS — L578 Other skin changes due to chronic exposure to nonionizing radiation: Secondary | ICD-10-CM | POA: Diagnosis not present

## 2012-05-26 DIAGNOSIS — Q809 Congenital ichthyosis, unspecified: Secondary | ICD-10-CM | POA: Diagnosis not present

## 2012-05-27 DIAGNOSIS — I739 Peripheral vascular disease, unspecified: Secondary | ICD-10-CM | POA: Diagnosis not present

## 2012-05-27 DIAGNOSIS — G527 Disorders of multiple cranial nerves: Secondary | ICD-10-CM | POA: Diagnosis not present

## 2012-06-21 ENCOUNTER — Emergency Department: Payer: Self-pay | Admitting: Emergency Medicine

## 2012-06-21 DIAGNOSIS — Z96659 Presence of unspecified artificial knee joint: Secondary | ICD-10-CM | POA: Diagnosis not present

## 2012-06-21 DIAGNOSIS — Z882 Allergy status to sulfonamides status: Secondary | ICD-10-CM | POA: Diagnosis not present

## 2012-06-21 DIAGNOSIS — I10 Essential (primary) hypertension: Secondary | ICD-10-CM | POA: Diagnosis not present

## 2012-06-21 DIAGNOSIS — S8010XA Contusion of unspecified lower leg, initial encounter: Secondary | ICD-10-CM | POA: Diagnosis not present

## 2012-06-21 DIAGNOSIS — Z7902 Long term (current) use of antithrombotics/antiplatelets: Secondary | ICD-10-CM | POA: Diagnosis not present

## 2012-06-21 DIAGNOSIS — E785 Hyperlipidemia, unspecified: Secondary | ICD-10-CM | POA: Diagnosis not present

## 2012-06-21 DIAGNOSIS — Z880 Allergy status to penicillin: Secondary | ICD-10-CM | POA: Diagnosis not present

## 2012-06-21 DIAGNOSIS — Z7901 Long term (current) use of anticoagulants: Secondary | ICD-10-CM | POA: Diagnosis not present

## 2012-06-21 DIAGNOSIS — Z79899 Other long term (current) drug therapy: Secondary | ICD-10-CM | POA: Diagnosis not present

## 2012-06-21 DIAGNOSIS — M7989 Other specified soft tissue disorders: Secondary | ICD-10-CM | POA: Diagnosis not present

## 2012-06-21 DIAGNOSIS — Z888 Allergy status to other drugs, medicaments and biological substances status: Secondary | ICD-10-CM | POA: Diagnosis not present

## 2012-06-24 DIAGNOSIS — I10 Essential (primary) hypertension: Secondary | ICD-10-CM | POA: Diagnosis not present

## 2012-06-24 DIAGNOSIS — Z7901 Long term (current) use of anticoagulants: Secondary | ICD-10-CM | POA: Diagnosis not present

## 2012-06-24 DIAGNOSIS — R011 Cardiac murmur, unspecified: Secondary | ICD-10-CM | POA: Diagnosis not present

## 2012-06-24 DIAGNOSIS — I2510 Atherosclerotic heart disease of native coronary artery without angina pectoris: Secondary | ICD-10-CM | POA: Diagnosis not present

## 2012-06-28 DIAGNOSIS — M5430 Sciatica, unspecified side: Secondary | ICD-10-CM | POA: Diagnosis not present

## 2012-06-28 DIAGNOSIS — M79609 Pain in unspecified limb: Secondary | ICD-10-CM | POA: Diagnosis not present

## 2012-06-28 DIAGNOSIS — I70219 Atherosclerosis of native arteries of extremities with intermittent claudication, unspecified extremity: Secondary | ICD-10-CM | POA: Diagnosis not present

## 2012-06-28 DIAGNOSIS — M1990 Unspecified osteoarthritis, unspecified site: Secondary | ICD-10-CM | POA: Diagnosis not present

## 2012-07-19 DIAGNOSIS — R011 Cardiac murmur, unspecified: Secondary | ICD-10-CM | POA: Diagnosis not present

## 2012-07-29 ENCOUNTER — Other Ambulatory Visit: Payer: Self-pay | Admitting: Internal Medicine

## 2012-07-29 DIAGNOSIS — I10 Essential (primary) hypertension: Secondary | ICD-10-CM | POA: Diagnosis not present

## 2012-07-29 DIAGNOSIS — M79609 Pain in unspecified limb: Secondary | ICD-10-CM | POA: Diagnosis not present

## 2012-07-29 DIAGNOSIS — I70219 Atherosclerosis of native arteries of extremities with intermittent claudication, unspecified extremity: Secondary | ICD-10-CM | POA: Diagnosis not present

## 2012-07-29 DIAGNOSIS — I6529 Occlusion and stenosis of unspecified carotid artery: Secondary | ICD-10-CM | POA: Diagnosis not present

## 2012-07-29 NOTE — Telephone Encounter (Signed)
Eprescribed.

## 2012-08-02 DIAGNOSIS — N390 Urinary tract infection, site not specified: Secondary | ICD-10-CM | POA: Diagnosis not present

## 2012-08-02 DIAGNOSIS — N3020 Other chronic cystitis without hematuria: Secondary | ICD-10-CM | POA: Diagnosis not present

## 2012-08-12 DIAGNOSIS — M84469A Pathological fracture, unspecified tibia and fibula, initial encounter for fracture: Secondary | ICD-10-CM | POA: Diagnosis not present

## 2012-08-12 DIAGNOSIS — M19079 Primary osteoarthritis, unspecified ankle and foot: Secondary | ICD-10-CM | POA: Diagnosis not present

## 2012-08-17 ENCOUNTER — Ambulatory Visit: Payer: Self-pay | Admitting: Unknown Physician Specialty

## 2012-08-17 ENCOUNTER — Ambulatory Visit: Payer: Self-pay | Admitting: Internal Medicine

## 2012-08-17 DIAGNOSIS — R609 Edema, unspecified: Secondary | ICD-10-CM | POA: Diagnosis not present

## 2012-08-17 DIAGNOSIS — M25579 Pain in unspecified ankle and joints of unspecified foot: Secondary | ICD-10-CM | POA: Diagnosis not present

## 2012-08-17 DIAGNOSIS — Z1231 Encounter for screening mammogram for malignant neoplasm of breast: Secondary | ICD-10-CM | POA: Diagnosis not present

## 2012-08-17 DIAGNOSIS — N6459 Other signs and symptoms in breast: Secondary | ICD-10-CM | POA: Diagnosis not present

## 2012-08-18 ENCOUNTER — Telehealth: Payer: Self-pay | Admitting: Internal Medicine

## 2012-08-18 NOTE — Telephone Encounter (Signed)
Recent mammogram from 08/17/2012 recommended additional views of small asymmetry in left breast. Has this been scheduled?

## 2012-08-19 ENCOUNTER — Ambulatory Visit: Payer: Self-pay | Admitting: Internal Medicine

## 2012-08-19 DIAGNOSIS — R928 Other abnormal and inconclusive findings on diagnostic imaging of breast: Secondary | ICD-10-CM | POA: Diagnosis not present

## 2012-08-19 DIAGNOSIS — N6489 Other specified disorders of breast: Secondary | ICD-10-CM | POA: Diagnosis not present

## 2012-08-19 NOTE — Telephone Encounter (Signed)
Left message to call back  

## 2012-08-20 NOTE — Telephone Encounter (Signed)
Patient had this done yesterday

## 2012-08-24 ENCOUNTER — Encounter: Payer: Self-pay | Admitting: Internal Medicine

## 2012-08-25 ENCOUNTER — Other Ambulatory Visit: Payer: Self-pay

## 2012-08-26 ENCOUNTER — Encounter: Payer: Self-pay | Admitting: Internal Medicine

## 2012-08-31 ENCOUNTER — Other Ambulatory Visit: Payer: Self-pay | Admitting: Internal Medicine

## 2012-09-06 ENCOUNTER — Encounter: Payer: Self-pay | Admitting: Internal Medicine

## 2012-09-06 ENCOUNTER — Ambulatory Visit (INDEPENDENT_AMBULATORY_CARE_PROVIDER_SITE_OTHER): Payer: Medicare Other | Admitting: Internal Medicine

## 2012-09-06 VITALS — BP 144/64 | HR 55 | Temp 98.4°F | Wt 124.0 lb

## 2012-09-06 DIAGNOSIS — E785 Hyperlipidemia, unspecified: Secondary | ICD-10-CM | POA: Diagnosis not present

## 2012-09-06 DIAGNOSIS — I10 Essential (primary) hypertension: Secondary | ICD-10-CM

## 2012-09-06 DIAGNOSIS — E039 Hypothyroidism, unspecified: Secondary | ICD-10-CM

## 2012-09-06 DIAGNOSIS — D649 Anemia, unspecified: Secondary | ICD-10-CM | POA: Diagnosis not present

## 2012-09-06 LAB — COMPREHENSIVE METABOLIC PANEL
AST: 25 U/L (ref 0–37)
Albumin: 4.1 g/dL (ref 3.5–5.2)
Alkaline Phosphatase: 49 U/L (ref 39–117)
Potassium: 4.7 mEq/L (ref 3.5–5.1)
Sodium: 137 mEq/L (ref 135–145)
Total Protein: 7.1 g/dL (ref 6.0–8.3)

## 2012-09-06 LAB — CBC WITH DIFFERENTIAL/PLATELET
Eosinophils Absolute: 0.1 10*3/uL (ref 0.0–0.7)
MCHC: 33.7 g/dL (ref 30.0–36.0)
MCV: 95.7 fl (ref 78.0–100.0)
Monocytes Absolute: 0.6 10*3/uL (ref 0.1–1.0)
Neutrophils Relative %: 50 % (ref 43.0–77.0)
Platelets: 239 10*3/uL (ref 150.0–400.0)

## 2012-09-06 LAB — LIPID PANEL
Cholesterol: 146 mg/dL (ref 0–200)
Total CHOL/HDL Ratio: 3
Triglycerides: 92 mg/dL (ref 0.0–149.0)

## 2012-09-06 NOTE — Assessment & Plan Note (Signed)
BP Readings from Last 3 Encounters:  09/06/12 144/64  01/26/12 120/70  10/01/11 132/70   BP generally well controlled on current medications. Will continue. Will check renal function with labs.

## 2012-09-06 NOTE — Assessment & Plan Note (Signed)
Will check TSH with labs today. Continue Levothyroxine. 

## 2012-09-06 NOTE — Assessment & Plan Note (Signed)
Will check lipids and LFTs with labs today. Continue Pravastatin. 

## 2012-09-06 NOTE — Progress Notes (Signed)
Subjective:    Patient ID: Krista Ellis, female    DOB: [birth year], 79 y.o.   MRN: [id number]  HPI 79 year old female with history of hypertension, hypothyroidism, hyperlipidemia presents for followup. She reports she is generally been feeling well. Earlier this year, she suffered loss of appetite related to increased anxiety after learning of her grandsons incarceration. She reports this was a difficult time. However, she has subsequently regained her appetite is doing well. She notes her cardiologist stopped her clonidine patch because of tachycardia. She reports her blood pressure has been well-controlled with amlodipine and metoprolol. She denies any recent chest pain, headache, palpitations. She reports she is generally feeling well.  Outpatient Encounter Prescriptions as of 09/06/2012  Medication Sig Dispense Refill  . amLODipine (NORVASC) 10 MG tablet Take 10 mg by mouth Daily.      . clopidogrel (PLAVIX) 75 MG tablet TAKE 1 TABLET BY MOUTH DAILY  90 tablet  1  . Cranberry 200 MG CAPS Take by mouth.      Marland Kitchen HYDROcodone-acetaminophen (VICODIN) 5-500 MG per tablet Take 1 tablet by mouth every 4 (four) hours as needed.  60 tablet  3  . levothyroxine (SYNTHROID, LEVOTHROID) 25 MCG tablet TAKE 1 TABLET (25 MCG TOTAL) BY MOUTH DAILY.  30 tablet  5  . meloxicam (MOBIC) 7.5 MG tablet TAKE 1 TABLET (7.5 MG TOTAL) BY MOUTH DAILY.  90 tablet  0  . metoprolol (TOPROL-XL) 50 MG 24 hr tablet Take 1 tablet by mouth Twice daily.      . pravastatin (PRAVACHOL) 40 MG tablet Take 1 tablet (40 mg total) by mouth every evening.  90 tablet  3  . [DISCONTINUED] cloNIDine (CATAPRES - DOSED IN MG/24 HR) 0.1 mg/24hr patch Place 1 patch (0.1 mg total) onto the skin once a week.  4 patch  0  . [DISCONTINUED] nitrofurantoin (MACRODANTIN) 100 MG capsule        No facility-administered encounter medications on file as of 09/06/2012.   BP 144/64  Pulse 55  Temp(Src) 98.4 F (36.9 C) (Oral)  Wt 124 lb (56.246 kg)   BMI 21.97 kg/m2  SpO2 97%  Review of Systems  Constitutional: Negative for fever, chills, appetite change, fatigue and unexpected weight change.  HENT: Negative for ear pain, congestion, sore throat, trouble swallowing, neck pain, voice change and sinus pressure.   Eyes: Negative for visual disturbance.  Respiratory: Negative for cough, shortness of breath, wheezing and stridor.   Cardiovascular: Negative for chest pain, palpitations and leg swelling.  Gastrointestinal: Negative for nausea, vomiting, abdominal pain, diarrhea, constipation, blood in stool, abdominal distention and anal bleeding.  Genitourinary: Negative for dysuria and flank pain.  Musculoskeletal: Negative for myalgias, arthralgias and gait problem.  Skin: Negative for color change and rash.  Neurological: Negative for dizziness and headaches.  Hematological: Negative for adenopathy. Does not bruise/bleed easily.  Psychiatric/Behavioral: Negative for suicidal ideas, sleep disturbance and dysphoric mood. The patient is not nervous/anxious.        Objective:   Physical Exam  Constitutional: She is oriented to person, place, and time. She appears well-developed and well-nourished. No distress.  HENT:  Head: Normocephalic and atraumatic.  Right Ear: External ear normal.  Left Ear: External ear normal.  Nose: Nose normal.  Mouth/Throat: Oropharynx is clear and moist. No oropharyngeal exudate.  Eyes: Conjunctivae are normal. Pupils are equal, round, and reactive to light. Right eye exhibits no discharge. Left eye exhibits no discharge. No scleral icterus.  Neck: Normal range of motion. Neck  supple. No tracheal deviation present. No thyromegaly present.  Cardiovascular: Normal rate, regular rhythm, normal heart sounds and intact distal pulses.  Exam reveals no gallop and no friction rub.   No murmur heard. Pulmonary/Chest: Effort normal and breath sounds normal. No accessory muscle usage. Not tachypneic. No respiratory  distress. She has no decreased breath sounds. She has no wheezes. She has no rhonchi. She has no rales. She exhibits no tenderness.  Musculoskeletal: Normal range of motion. She exhibits no edema and no tenderness.  Lymphadenopathy:    She has no cervical adenopathy.  Neurological: She is alert and oriented to person, place, and time. No cranial nerve deficit. She exhibits normal muscle tone. Coordination normal.  Skin: Skin is warm and dry. No rash noted. She is not diaphoretic. No erythema. No pallor.  Psychiatric: She has a normal mood and affect. Her behavior is normal. Judgment and thought content normal.          Assessment & Plan:

## 2012-09-14 ENCOUNTER — Other Ambulatory Visit: Payer: Self-pay | Admitting: Internal Medicine

## 2012-09-22 ENCOUNTER — Telehealth: Payer: Self-pay | Admitting: *Deleted

## 2012-09-22 NOTE — Telephone Encounter (Signed)
Patient left message on voicemail requesting a copy of her labs be mailed to her home address. Copy of labs mailed to patient home address on file.

## 2012-09-27 DIAGNOSIS — N3020 Other chronic cystitis without hematuria: Secondary | ICD-10-CM | POA: Diagnosis not present

## 2012-09-27 DIAGNOSIS — N390 Urinary tract infection, site not specified: Secondary | ICD-10-CM | POA: Diagnosis not present

## 2012-09-27 DIAGNOSIS — S92009A Unspecified fracture of unspecified calcaneus, initial encounter for closed fracture: Secondary | ICD-10-CM | POA: Diagnosis not present

## 2012-10-01 ENCOUNTER — Ambulatory Visit: Payer: Self-pay | Admitting: Hematology and Oncology

## 2012-10-01 DIAGNOSIS — I69928 Other speech and language deficits following unspecified cerebrovascular disease: Secondary | ICD-10-CM | POA: Diagnosis not present

## 2012-10-01 DIAGNOSIS — Z8673 Personal history of transient ischemic attack (TIA), and cerebral infarction without residual deficits: Secondary | ICD-10-CM | POA: Diagnosis not present

## 2012-10-01 DIAGNOSIS — R918 Other nonspecific abnormal finding of lung field: Secondary | ICD-10-CM | POA: Diagnosis not present

## 2012-10-01 DIAGNOSIS — Z87440 Personal history of urinary (tract) infections: Secondary | ICD-10-CM | POA: Diagnosis not present

## 2012-10-01 DIAGNOSIS — Z7902 Long term (current) use of antithrombotics/antiplatelets: Secondary | ICD-10-CM | POA: Diagnosis not present

## 2012-10-01 DIAGNOSIS — Z79899 Other long term (current) drug therapy: Secondary | ICD-10-CM | POA: Diagnosis not present

## 2012-10-05 DIAGNOSIS — R918 Other nonspecific abnormal finding of lung field: Secondary | ICD-10-CM | POA: Diagnosis not present

## 2012-10-05 LAB — CBC CANCER CENTER
Basophil #: 0 x10 3/mm (ref 0.0–0.1)
Basophil %: 0.4 %
Eosinophil #: 0.1 x10 3/mm (ref 0.0–0.7)
Eosinophil %: 2.1 %
HCT: 41.6 % (ref 35.0–47.0)
Lymphocyte #: 2 x10 3/mm (ref 1.0–3.6)
MCH: 32.1 pg (ref 26.0–34.0)
MCV: 96 fL (ref 80–100)
Monocyte %: 10 %
Neutrophil #: 2.8 x10 3/mm (ref 1.4–6.5)
Neutrophil %: 50.5 %
Platelet: 197 x10 3/mm (ref 150–440)
WBC: 5.5 x10 3/mm (ref 3.6–11.0)

## 2012-10-05 LAB — BASIC METABOLIC PANEL
Calcium, Total: 9.7 mg/dL (ref 8.5–10.1)
Co2: 28 mmol/L (ref 21–32)
Creatinine: 0.95 mg/dL (ref 0.60–1.30)
EGFR (African American): >60
EGFR (Non-African Amer.): 57 — ABNORMAL LOW
Potassium: 4.5 mmol/L (ref 3.5–5.1)
Sodium: 141 mmol/L (ref 136–145)

## 2012-10-12 DIAGNOSIS — R918 Other nonspecific abnormal finding of lung field: Secondary | ICD-10-CM | POA: Diagnosis not present

## 2012-10-20 ENCOUNTER — Ambulatory Visit: Payer: Self-pay | Admitting: Hematology and Oncology

## 2012-10-20 DIAGNOSIS — D414 Neoplasm of uncertain behavior of bladder: Secondary | ICD-10-CM | POA: Diagnosis not present

## 2012-10-20 DIAGNOSIS — N3020 Other chronic cystitis without hematuria: Secondary | ICD-10-CM | POA: Diagnosis not present

## 2012-10-25 ENCOUNTER — Other Ambulatory Visit: Payer: Self-pay | Admitting: Internal Medicine

## 2012-10-28 ENCOUNTER — Ambulatory Visit: Payer: Self-pay | Admitting: Urology

## 2012-10-28 DIAGNOSIS — N329 Bladder disorder, unspecified: Secondary | ICD-10-CM | POA: Diagnosis not present

## 2012-10-28 DIAGNOSIS — Z01810 Encounter for preprocedural cardiovascular examination: Secondary | ICD-10-CM | POA: Diagnosis not present

## 2012-10-28 DIAGNOSIS — I119 Hypertensive heart disease without heart failure: Secondary | ICD-10-CM | POA: Diagnosis not present

## 2012-11-05 DIAGNOSIS — Z23 Encounter for immunization: Secondary | ICD-10-CM | POA: Diagnosis not present

## 2012-11-09 ENCOUNTER — Ambulatory Visit: Payer: Self-pay | Admitting: Urology

## 2012-11-09 DIAGNOSIS — N3020 Other chronic cystitis without hematuria: Secondary | ICD-10-CM | POA: Diagnosis not present

## 2012-11-09 DIAGNOSIS — E079 Disorder of thyroid, unspecified: Secondary | ICD-10-CM | POA: Diagnosis not present

## 2012-11-09 DIAGNOSIS — Z7902 Long term (current) use of antithrombotics/antiplatelets: Secondary | ICD-10-CM | POA: Diagnosis not present

## 2012-11-09 DIAGNOSIS — Z882 Allergy status to sulfonamides status: Secondary | ICD-10-CM | POA: Diagnosis not present

## 2012-11-09 DIAGNOSIS — R011 Cardiac murmur, unspecified: Secondary | ICD-10-CM | POA: Diagnosis not present

## 2012-11-09 DIAGNOSIS — D414 Neoplasm of uncertain behavior of bladder: Secondary | ICD-10-CM | POA: Diagnosis not present

## 2012-11-09 DIAGNOSIS — Z8673 Personal history of transient ischemic attack (TIA), and cerebral infarction without residual deficits: Secondary | ICD-10-CM | POA: Diagnosis not present

## 2012-11-09 DIAGNOSIS — Z888 Allergy status to other drugs, medicaments and biological substances status: Secondary | ICD-10-CM | POA: Diagnosis not present

## 2012-11-09 DIAGNOSIS — Z79899 Other long term (current) drug therapy: Secondary | ICD-10-CM | POA: Diagnosis not present

## 2012-11-09 DIAGNOSIS — N329 Bladder disorder, unspecified: Secondary | ICD-10-CM | POA: Diagnosis not present

## 2012-11-09 DIAGNOSIS — M129 Arthropathy, unspecified: Secondary | ICD-10-CM | POA: Diagnosis not present

## 2012-11-09 DIAGNOSIS — Z96659 Presence of unspecified artificial knee joint: Secondary | ICD-10-CM | POA: Diagnosis not present

## 2012-11-09 DIAGNOSIS — Z880 Allergy status to penicillin: Secondary | ICD-10-CM | POA: Diagnosis not present

## 2012-11-09 DIAGNOSIS — IMO0002 Reserved for concepts with insufficient information to code with codable children: Secondary | ICD-10-CM | POA: Diagnosis not present

## 2012-11-09 DIAGNOSIS — Z8711 Personal history of peptic ulcer disease: Secondary | ICD-10-CM | POA: Diagnosis not present

## 2012-11-09 DIAGNOSIS — I10 Essential (primary) hypertension: Secondary | ICD-10-CM | POA: Diagnosis not present

## 2012-11-24 DIAGNOSIS — D414 Neoplasm of uncertain behavior of bladder: Secondary | ICD-10-CM | POA: Diagnosis not present

## 2012-11-24 DIAGNOSIS — N3941 Urge incontinence: Secondary | ICD-10-CM | POA: Diagnosis not present

## 2012-11-24 DIAGNOSIS — E1165 Type 2 diabetes mellitus with hyperglycemia: Secondary | ICD-10-CM | POA: Insufficient documentation

## 2012-11-24 DIAGNOSIS — N3020 Other chronic cystitis without hematuria: Secondary | ICD-10-CM | POA: Diagnosis not present

## 2012-11-25 ENCOUNTER — Other Ambulatory Visit: Payer: Self-pay

## 2012-11-30 ENCOUNTER — Ambulatory Visit (INDEPENDENT_AMBULATORY_CARE_PROVIDER_SITE_OTHER): Payer: Medicare Other | Admitting: Internal Medicine

## 2012-11-30 ENCOUNTER — Encounter: Payer: Self-pay | Admitting: Internal Medicine

## 2012-11-30 VITALS — BP 128/54 | HR 58 | Temp 98.1°F | Wt 123.0 lb

## 2012-11-30 DIAGNOSIS — R81 Glycosuria: Secondary | ICD-10-CM | POA: Insufficient documentation

## 2012-11-30 DIAGNOSIS — I10 Essential (primary) hypertension: Secondary | ICD-10-CM

## 2012-11-30 DIAGNOSIS — Z780 Asymptomatic menopausal state: Secondary | ICD-10-CM

## 2012-11-30 DIAGNOSIS — N951 Menopausal and female climacteric states: Secondary | ICD-10-CM | POA: Insufficient documentation

## 2012-11-30 NOTE — Assessment & Plan Note (Signed)
BP Readings from Last 3 Encounters:  11/30/12 128/54  09/06/12 144/64  01/26/12 120/70   BP well controlled on current medication. Will continue.

## 2012-11-30 NOTE — Progress Notes (Signed)
Subjective:    Patient ID: Krista Ellis, female    DOB: 25-Oct-1930, 79 y.o.   MRN: [id number]  HPI 79 year old female with history of hypertension, hyperlipidemia, and recurrent urinary tract infections presents for acute visit. She was seen by her urologist and noted to have glucosuria. He performed an A1c which was 5.6%. He was concerned about diabetes so had the patient followup with [place]. Patient has no known history of diabetes. Previous blood sugars reviewed today about a normal. She notes some increased intake of sweets recently. At the time of her urinalysis, she had urinary tract infection.   She recently underwent cystoscopy which reportedly showed an ulcer within her bladder. She is being treated for UTI with azithromycin by her urologist. She notes some persistent urinary frequency and hematuria. She has followup with her urologist on Monday. She denies any fever, chills, flank pain.  Outpatient Encounter Prescriptions as of 11/30/2012  Medication Sig  . amLODipine (NORVASC) 10 MG tablet Take 10 mg by mouth Daily.  Marland Kitchen azithromycin (ZITHROMAX) 250 MG tablet   . clopidogrel (PLAVIX) 75 MG tablet TAKE 1 TABLET BY MOUTH DAILY  . HYDROcodone-acetaminophen (VICODIN) 5-500 MG per tablet Take 1 tablet by mouth every 4 (four) hours as needed.  Marland Kitchen levothyroxine (SYNTHROID, LEVOTHROID) 25 MCG tablet TAKE 1 TABLET (25 MCG TOTAL) BY MOUTH DAILY.  . meloxicam (MOBIC) 7.5 MG tablet TAKE 1 TABLET (7.5 MG TOTAL) BY MOUTH DAILY.  . metoprolol (TOPROL-XL) 50 MG 24 hr tablet Take 1 tablet by mouth Twice daily.  . Cranberry 200 MG CAPS Take by mouth.  . pravastatin (PRAVACHOL) 40 MG tablet Take 1 tablet (40 mg total) by mouth every evening.   BP 128/54  Pulse 58  Temp(Src) 98.1 F (36.7 C) (Oral)  Wt 123 lb (55.792 kg)  SpO2 95%  Review of Systems  Constitutional: Negative for fever, chills, appetite change, fatigue and unexpected weight change.  HENT: Negative for congestion, ear pain, sinus  pressure, sore throat, trouble swallowing and voice change.   Eyes: Negative for visual disturbance.  Respiratory: Negative for cough, shortness of breath, wheezing and stridor.   Cardiovascular: Negative for chest pain, palpitations and leg swelling.  Gastrointestinal: Negative for nausea, vomiting, abdominal pain, diarrhea, constipation, blood in stool, abdominal distention and anal bleeding.  Genitourinary: Positive for frequency and hematuria. Negative for dysuria and flank pain.  Musculoskeletal: Negative for arthralgias, gait problem, myalgias and neck pain.  Skin: Negative for color change and rash.  Neurological: Negative for dizziness and headaches.  Hematological: Negative for adenopathy. Does not bruise/bleed easily.  Psychiatric/Behavioral: Negative for suicidal ideas, sleep disturbance and dysphoric mood. The patient is not nervous/anxious.        Objective:   Physical Exam  Constitutional: She is oriented to person, place, and time. She appears well-developed and well-nourished. No distress.  HENT:  Head: Normocephalic and atraumatic.  Right Ear: External ear normal.  Left Ear: External ear normal.  Nose: Nose normal.  Mouth/Throat: Oropharynx is clear and moist. No oropharyngeal exudate.  Eyes: Conjunctivae are normal. Pupils are equal, round, and reactive to light. Right eye exhibits no discharge. Left eye exhibits no discharge. No scleral icterus.  Neck: Normal range of motion. Neck supple. No tracheal deviation present. No thyromegaly present.  Cardiovascular: Normal rate, regular rhythm, normal heart sounds and intact distal pulses.  Exam reveals no gallop and no friction rub.   No murmur heard. Pulmonary/Chest: Effort normal and breath sounds normal. No accessory muscle usage. Not tachypneic.  No respiratory distress. She has no decreased breath sounds. She has no wheezes. She has no rhonchi. She has no rales. She exhibits no tenderness.  Musculoskeletal: Normal range  of motion. She exhibits no edema and no tenderness.  Lymphadenopathy:    She has no cervical adenopathy.  Neurological: She is alert and oriented to person, place, and time. No cranial nerve deficit. She exhibits normal muscle tone. Coordination normal.  Skin: Skin is warm and dry. No rash noted. She is not diaphoretic. No erythema. No pallor.  Psychiatric: She has a normal mood and affect. Her behavior is normal. Judgment and thought content normal.          Assessment & Plan:

## 2012-11-30 NOTE — Progress Notes (Signed)
Pre-visit discussion using our clinic review tool. No additional management support is needed unless otherwise documented below in the visit note.  

## 2012-11-30 NOTE — Assessment & Plan Note (Signed)
Pt due for bone density testing. Will schedule.

## 2012-11-30 NOTE — Assessment & Plan Note (Signed)
Noted by urologist. Previous BG normal here. A1c normal at 5.6%. Most likely glucosuria secondary to infection. No evidence of diabetes. Given that she has persistent symptoms of infection with increased urinary frequency and hematuria, will hold off on repeat UA for glucose today. Plan to repeat with blood work at visit in 02/2013.

## 2012-12-01 ENCOUNTER — Emergency Department: Payer: Self-pay | Admitting: Emergency Medicine

## 2012-12-01 DIAGNOSIS — R911 Solitary pulmonary nodule: Secondary | ICD-10-CM | POA: Diagnosis not present

## 2012-12-01 DIAGNOSIS — R918 Other nonspecific abnormal finding of lung field: Secondary | ICD-10-CM | POA: Diagnosis not present

## 2012-12-01 DIAGNOSIS — Z888 Allergy status to other drugs, medicaments and biological substances status: Secondary | ICD-10-CM | POA: Diagnosis not present

## 2012-12-01 DIAGNOSIS — I10 Essential (primary) hypertension: Secondary | ICD-10-CM | POA: Diagnosis not present

## 2012-12-01 DIAGNOSIS — R319 Hematuria, unspecified: Secondary | ICD-10-CM | POA: Diagnosis not present

## 2012-12-01 DIAGNOSIS — Z79899 Other long term (current) drug therapy: Secondary | ICD-10-CM | POA: Diagnosis not present

## 2012-12-01 DIAGNOSIS — Z9079 Acquired absence of other genital organ(s): Secondary | ICD-10-CM | POA: Diagnosis not present

## 2012-12-01 DIAGNOSIS — N139 Obstructive and reflux uropathy, unspecified: Secondary | ICD-10-CM | POA: Diagnosis not present

## 2012-12-01 DIAGNOSIS — N949 Unspecified condition associated with female genital organs and menstrual cycle: Secondary | ICD-10-CM | POA: Diagnosis not present

## 2012-12-01 DIAGNOSIS — R52 Pain, unspecified: Secondary | ICD-10-CM | POA: Diagnosis not present

## 2012-12-01 DIAGNOSIS — Z9089 Acquired absence of other organs: Secondary | ICD-10-CM | POA: Diagnosis not present

## 2012-12-01 DIAGNOSIS — N1330 Unspecified hydronephrosis: Secondary | ICD-10-CM | POA: Diagnosis not present

## 2012-12-01 LAB — COMPREHENSIVE METABOLIC PANEL
Alkaline Phosphatase: 62 U/L (ref 50–136)
Anion Gap: 5 — ABNORMAL LOW (ref 7–16)
BUN: 36 mg/dL — ABNORMAL HIGH (ref 7–18)
Bilirubin,Total: 0.3 mg/dL (ref 0.2–1.0)
Calcium, Total: 9.7 mg/dL (ref 8.5–10.1)
Co2: 24 mmol/L (ref 21–32)
EGFR (Non-African Amer.): 52 — ABNORMAL LOW
Glucose: 144 mg/dL — ABNORMAL HIGH (ref 65–99)
Osmolality: 285 (ref 275–301)
Potassium: 4.6 mmol/L (ref 3.5–5.1)
SGOT(AST): 26 U/L (ref 15–37)
Sodium: 137 mmol/L (ref 136–145)

## 2012-12-01 LAB — CBC
HCT: 37.9 % (ref 35.0–47.0)
MCH: 31.9 pg (ref 26.0–34.0)
MCHC: 33.7 g/dL (ref 32.0–36.0)
MCV: 95 fL (ref 80–100)
RBC: 4.01 10*6/uL (ref 3.80–5.20)
RDW: 12.3 % (ref 11.5–14.5)
WBC: 11.1 10*3/uL — ABNORMAL HIGH (ref 3.6–11.0)

## 2012-12-02 DIAGNOSIS — R339 Retention of urine, unspecified: Secondary | ICD-10-CM | POA: Insufficient documentation

## 2012-12-02 DIAGNOSIS — D414 Neoplasm of uncertain behavior of bladder: Secondary | ICD-10-CM | POA: Diagnosis not present

## 2012-12-02 DIAGNOSIS — R310 Gross hematuria: Secondary | ICD-10-CM | POA: Insufficient documentation

## 2012-12-02 DIAGNOSIS — R338 Other retention of urine: Secondary | ICD-10-CM | POA: Diagnosis not present

## 2012-12-02 DIAGNOSIS — N3020 Other chronic cystitis without hematuria: Secondary | ICD-10-CM | POA: Diagnosis not present

## 2012-12-10 ENCOUNTER — Other Ambulatory Visit: Payer: Self-pay | Admitting: Internal Medicine

## 2012-12-10 ENCOUNTER — Ambulatory Visit (INDEPENDENT_AMBULATORY_CARE_PROVIDER_SITE_OTHER): Payer: Medicare Other | Admitting: Internal Medicine

## 2012-12-10 ENCOUNTER — Encounter: Payer: Self-pay | Admitting: Internal Medicine

## 2012-12-10 VITALS — BP 126/60 | HR 75 | Temp 98.0°F | Resp 12 | Ht 63.0 in | Wt 122.5 lb

## 2012-12-10 DIAGNOSIS — R5381 Other malaise: Secondary | ICD-10-CM | POA: Insufficient documentation

## 2012-12-10 DIAGNOSIS — R1314 Dysphagia, pharyngoesophageal phase: Secondary | ICD-10-CM | POA: Insufficient documentation

## 2012-12-10 DIAGNOSIS — N3289 Other specified disorders of bladder: Secondary | ICD-10-CM | POA: Diagnosis not present

## 2012-12-10 LAB — COMPREHENSIVE METABOLIC PANEL
Albumin: 3.4 g/dL — ABNORMAL LOW (ref 3.5–5.2)
Alkaline Phosphatase: 47 U/L (ref 39–117)
BUN: 18 mg/dL (ref 6–23)
CO2: 25 mEq/L (ref 19–32)
Calcium: 9.1 mg/dL (ref 8.4–10.5)
GFR: 82.73 mL/min (ref >60.00)
Glucose, Bld: 96 mg/dL (ref 70–99)
Potassium: 4.1 mEq/L (ref 3.5–5.1)
Sodium: 135 mEq/L (ref 135–145)
Total Protein: 6.2 g/dL (ref 6.0–8.3)

## 2012-12-10 LAB — CBC WITH DIFFERENTIAL/PLATELET
Basophils Relative: 0.2 % (ref 0.0–3.0)
Eosinophils Absolute: 0.2 10*3/uL (ref 0.0–0.7)
Eosinophils Relative: 3.8 % (ref 0.0–5.0)
Hemoglobin: 10.3 g/dL — ABNORMAL LOW (ref 12.0–15.0)
Lymphocytes Relative: 26.4 % (ref 12.0–46.0)
MCHC: 34 g/dL (ref 30.0–36.0)
MCV: 94.3 fl (ref 78.0–100.0)
Neutro Abs: 2.7 10*3/uL (ref 1.4–7.7)
RBC: 3.2 Mil/uL — ABNORMAL LOW (ref 3.87–5.11)

## 2012-12-10 LAB — FERRITIN: Ferritin: 61.1 ng/mL (ref 10.0–291.0)

## 2012-12-10 NOTE — Progress Notes (Signed)
Subjective:    Patient ID: Krista Ellis, female    DOB: 1930/04/10, 79 y.o.   MRN: [id number]  HPI 79 year old female with history of recurrent urinary tract infections and ulceration of the bladder wall presents for acute visit complaining of fatigue. She reports that last week she developed profuse hemorrhage from her bladder. She reports that she filled the toilet bowl at least twice with blood. She was ultimately evaluated in the emergency room and Foley catheter was placed. She followed up with her urologist and had another approximate liter of blood removed from her bladder. Her Plavix and aspirin were stopped. She reports that the bleeding has subsided. However, over the last several days she has felt extremely fatigued. She denies any fever, chills, shortness of breath, palpitations, chest pain. No change in appetite or bowel habits. She does note occasional dysphagia in her mid chest with difficulty passing food. This was evaluated by ENT in the past, who recommended speech therapy.  Outpatient Encounter Prescriptions as of 12/10/2012  Medication Sig  . amLODipine (NORVASC) 10 MG tablet Take 10 mg by mouth Daily.  Marland Kitchen azithromycin (ZITHROMAX) 250 MG tablet   . clopidogrel (PLAVIX) 75 MG tablet TAKE 1 TABLET BY MOUTH DAILY  . Cranberry 200 MG CAPS Take by mouth.  Marland Kitchen HYDROcodone-acetaminophen (VICODIN) 5-500 MG per tablet Take 1 tablet by mouth every 4 (four) hours as needed.  Marland Kitchen levothyroxine (SYNTHROID, LEVOTHROID) 25 MCG tablet TAKE 1 TABLET (25 MCG TOTAL) BY MOUTH DAILY.  . meloxicam (MOBIC) 7.5 MG tablet TAKE 1 TABLET (7.5 MG TOTAL) BY MOUTH DAILY.  . metoprolol (TOPROL-XL) 50 MG 24 hr tablet Take 1 tablet by mouth Twice daily.  . pravastatin (PRAVACHOL) 40 MG tablet Take 1 tablet (40 mg total) by mouth every evening.   BP 126/60  Pulse 75  Temp(Src) 98 F (36.7 C) (Oral)  Resp 12  Ht 5\' 3"  (1.6 m)  Wt 122 lb 8 oz (55.566 kg)  BMI 21.71 kg/m2  SpO2 98%  Review of Systems   Constitutional: Positive for fatigue. Negative for fever, chills, appetite change and unexpected weight change.  HENT: Positive for trouble swallowing. Negative for congestion, ear pain, sinus pressure, sore throat and voice change.   Eyes: Negative for visual disturbance.  Respiratory: Negative for cough, shortness of breath, wheezing and stridor.   Cardiovascular: Negative for chest pain, palpitations and leg swelling.  Gastrointestinal: Negative for nausea, vomiting, abdominal pain, diarrhea, constipation, blood in stool, abdominal distention and anal bleeding.  Genitourinary: Positive for hematuria (recent hemorrhage, now stopped). Negative for dysuria, urgency, flank pain, decreased urine volume and pelvic pain.  Musculoskeletal: Negative for arthralgias, gait problem, myalgias and neck pain.  Skin: Negative for color change and rash.  Neurological: Negative for dizziness and headaches.  Hematological: Negative for adenopathy. Does not bruise/bleed easily.  Psychiatric/Behavioral: Negative for suicidal ideas, sleep disturbance and dysphoric mood. The patient is not nervous/anxious.        Objective:   Physical Exam  Constitutional: She is oriented to person, place, and time. She appears well-developed and well-nourished. No distress.  HENT:  Head: Normocephalic and atraumatic.  Right Ear: External ear normal.  Left Ear: External ear normal.  Nose: Nose normal.  Mouth/Throat: Oropharynx is clear and moist. No oropharyngeal exudate.  Eyes: Conjunctivae are normal. Pupils are equal, round, and reactive to light. Right eye exhibits no discharge. Left eye exhibits no discharge. No scleral icterus.  Neck: Normal range of motion. Neck supple. No tracheal deviation present.  No thyromegaly present.  Cardiovascular: Normal rate, regular rhythm, normal heart sounds and intact distal pulses.  Exam reveals no gallop and no friction rub.   No murmur heard. Pulmonary/Chest: Effort normal and  breath sounds normal. No accessory muscle usage. Not tachypneic. No respiratory distress. She has no decreased breath sounds. She has no wheezes. She has no rhonchi. She has no rales. She exhibits no tenderness.  Abdominal: Soft. There is no tenderness.  Musculoskeletal: Normal range of motion. She exhibits no edema and no tenderness.  Lymphadenopathy:    She has no cervical adenopathy.  Neurological: She is alert and oriented to person, place, and time. No cranial nerve deficit. She exhibits normal muscle tone. Coordination normal.  Skin: Skin is warm and dry. No rash noted. She is not diaphoretic. No erythema. No pallor.  Psychiatric: She has a normal mood and affect. Her behavior is normal. Judgment and thought content normal.          Assessment & Plan:

## 2012-12-10 NOTE — Patient Instructions (Signed)
Please continue to stay OFF of the Plavix and Aspirin until we have labs.

## 2012-12-10 NOTE — Progress Notes (Signed)
Pre visit review using our clinic review tool, if applicable. No additional management support is needed unless otherwise documented below in the visit note. 

## 2012-12-10 NOTE — Assessment & Plan Note (Signed)
Recent hemorrhage from ulcer in bladder wall. Reviewed urology notes today. Encouraged pt to remain off Plavix and Aspirin. Will check CBC with labs today.

## 2012-12-10 NOTE — Assessment & Plan Note (Signed)
Discussed referral to GI for upper endoscopy to look for esophageal narrowing. However, will hold off for now given recent more acute issue with bladder hemorrhage.

## 2012-12-13 ENCOUNTER — Telehealth: Payer: Self-pay | Admitting: Internal Medicine

## 2012-12-13 DIAGNOSIS — N3020 Other chronic cystitis without hematuria: Secondary | ICD-10-CM | POA: Diagnosis not present

## 2012-12-13 MED ORDER — FERROUS SULFATE 325 (65 FE) MG PO TABS: ORAL_TABLET | ORAL | Status: DC

## 2012-12-13 NOTE — Telephone Encounter (Signed)
Patient called wanting lab results. 

## 2012-12-13 NOTE — Telephone Encounter (Signed)
Pt notified of labs, iron Rx sent to pharmacy

## 2013-01-05 ENCOUNTER — Telehealth: Payer: Self-pay | Admitting: *Deleted

## 2013-01-05 NOTE — Telephone Encounter (Signed)
OK. Can we have her return to clinic to repeat a CBC and ferritin?

## 2013-01-05 NOTE — Telephone Encounter (Signed)
Pt notified, appt scheduled for 01/07/13. Pt states had hives from iron, has stopped it x 3 days, rash is improving, taking Benadryl. Requested call back with worsening or persistence of symptoms.

## 2013-01-05 NOTE — Telephone Encounter (Signed)
Patient left a message on voicemail stating she was prescribe Iron but she can not take it. She is allergic to iron, please advise.

## 2013-01-07 ENCOUNTER — Telehealth: Payer: Self-pay | Admitting: *Deleted

## 2013-01-07 ENCOUNTER — Other Ambulatory Visit (INDEPENDENT_AMBULATORY_CARE_PROVIDER_SITE_OTHER): Payer: Medicare Other

## 2013-01-07 DIAGNOSIS — D649 Anemia, unspecified: Secondary | ICD-10-CM

## 2013-01-07 DIAGNOSIS — N390 Urinary tract infection, site not specified: Secondary | ICD-10-CM

## 2013-01-07 LAB — POCT URINALYSIS DIPSTICK

## 2013-01-07 LAB — CBC WITH DIFFERENTIAL/PLATELET
Basophils Absolute: 0 10*3/uL (ref 0.0–0.1)
Basophils Relative: 0 % (ref 0–1)
Hemoglobin: 12.7 g/dL (ref 12.0–15.0)
Lymphocytes Relative: 43 % (ref 12–46)
MCH: 31.9 pg (ref 26.0–34.0)
MCHC: 33.2 g/dL (ref 30.0–36.0)
MCV: 96 fL (ref 78.0–100.0)
Monocytes Absolute: 0.6 10*3/uL (ref 0.1–1.0)
Neutro Abs: 2.4 10*3/uL (ref 1.7–7.7)
Platelets: 228 10*3/uL (ref 150–400)
RDW: 13.3 % (ref 11.5–15.5)
WBC: 5.3 10*3/uL (ref 4.0–10.5)

## 2013-01-07 NOTE — Telephone Encounter (Signed)
CBC and ferritin for anemia 

## 2013-01-07 NOTE — Telephone Encounter (Signed)
What labs and dx?  

## 2013-01-17 ENCOUNTER — Telehealth: Payer: Self-pay | Admitting: *Deleted

## 2013-01-17 NOTE — Telephone Encounter (Signed)
Spoke with patient daughter, informed her to take patient to the ED. Verbally agreed to go to ED to be evaluated.

## 2013-01-17 NOTE — Telephone Encounter (Signed)
Patient has fell twice over the last past week, in pain on her right side. Thinks her hip gave out on her and she has had hip surgery. No openings and they are aware of that, they called Dr. Gavin Potters and he can not see them anytime soon. Is there anyway she could get an Xray done today?

## 2013-01-17 NOTE — Telephone Encounter (Signed)
Agree with Dr. Lorin Picket. Needs ED evaluation

## 2013-01-17 NOTE — Telephone Encounter (Signed)
If has fallen twice and in pain, needs evaluation and then can order appropriate xray needed.  To acute care of ER pending pain and other symptoms.  If hit head and headache or other symptoms - To er

## 2013-01-18 DIAGNOSIS — I8310 Varicose veins of unspecified lower extremity with inflammation: Secondary | ICD-10-CM | POA: Diagnosis not present

## 2013-01-18 DIAGNOSIS — L258 Unspecified contact dermatitis due to other agents: Secondary | ICD-10-CM | POA: Diagnosis not present

## 2013-01-18 DIAGNOSIS — L821 Other seborrheic keratosis: Secondary | ICD-10-CM | POA: Diagnosis not present

## 2013-01-20 HISTORY — PX: OTHER SURGICAL HISTORY: SHX169

## 2013-01-28 DIAGNOSIS — IMO0002 Reserved for concepts with insufficient information to code with codable children: Secondary | ICD-10-CM | POA: Diagnosis not present

## 2013-01-28 DIAGNOSIS — M1710 Unilateral primary osteoarthritis, unspecified knee: Secondary | ICD-10-CM | POA: Diagnosis not present

## 2013-01-31 ENCOUNTER — Other Ambulatory Visit: Payer: Self-pay | Admitting: *Deleted

## 2013-01-31 ENCOUNTER — Other Ambulatory Visit: Payer: Self-pay | Admitting: Internal Medicine

## 2013-01-31 DIAGNOSIS — N3020 Other chronic cystitis without hematuria: Secondary | ICD-10-CM | POA: Diagnosis not present

## 2013-01-31 DIAGNOSIS — M1990 Unspecified osteoarthritis, unspecified site: Secondary | ICD-10-CM

## 2013-01-31 NOTE — Telephone Encounter (Signed)
Ok refill? 

## 2013-01-31 NOTE — Telephone Encounter (Signed)
That is fine, however the 5-500 is no longer available. Will need to write 5-325mg  same instructions.

## 2013-01-31 NOTE — Telephone Encounter (Signed)
Would like a refill on Hydrocodone. Per patient she can not take Mobic because it makes her blood too thin.

## 2013-02-03 ENCOUNTER — Telehealth: Payer: Self-pay | Admitting: Internal Medicine

## 2013-02-03 MED ORDER — HYDROCODONE-ACETAMINOPHEN 5-325 MG PO TABS: 1.0000 | ORAL_TABLET | ORAL | Status: DC | PRN

## 2013-02-03 NOTE — Addendum Note (Signed)
Addended by: Ronaldo Miyamoto on: 02/03/2013 09:22 AM   Modules accepted: Orders

## 2013-02-03 NOTE — Telephone Encounter (Signed)
There is not another option except for IV iron. Can we set her up a follow up and repeat CBC?

## 2013-02-03 NOTE — Telephone Encounter (Signed)
Please advise 

## 2013-02-03 NOTE — Telephone Encounter (Signed)
Prescription printed and faxed to pharmacy informed patient prescription request are processed within 3 business days.

## 2013-02-03 NOTE — Telephone Encounter (Signed)
Pt came in today stating the last time she was here dr walker wanted her to get iron and take it.  Pt stated she did but the iron hurt her stomach and was unable to take iron.  She wanted to know if there iis something else she can take

## 2013-02-07 ENCOUNTER — Ambulatory Visit: Payer: Medicare Other | Admitting: Internal Medicine

## 2013-02-09 DIAGNOSIS — I6330 Cerebral infarction due to thrombosis of unspecified cerebral artery: Secondary | ICD-10-CM | POA: Diagnosis not present

## 2013-02-09 DIAGNOSIS — I209 Angina pectoris, unspecified: Secondary | ICD-10-CM | POA: Diagnosis not present

## 2013-02-09 DIAGNOSIS — E782 Mixed hyperlipidemia: Secondary | ICD-10-CM | POA: Diagnosis not present

## 2013-02-09 DIAGNOSIS — I119 Hypertensive heart disease without heart failure: Secondary | ICD-10-CM | POA: Diagnosis not present

## 2013-02-09 NOTE — Telephone Encounter (Signed)
Pt left vm.  States she is returning call to [facility].

## 2013-02-09 NOTE — Telephone Encounter (Signed)
Left message to call back  

## 2013-02-10 NOTE — Telephone Encounter (Signed)
Left message to call back  

## 2013-02-15 ENCOUNTER — Encounter: Payer: Self-pay | Admitting: *Deleted

## 2013-02-15 NOTE — Telephone Encounter (Signed)
Patient and I left several messages for one another, letter has been mailed to patient home address with Dr. Thomes Dinning instructions since we were unable to talk.

## 2013-02-21 ENCOUNTER — Encounter: Payer: Self-pay | Admitting: Internal Medicine

## 2013-02-21 ENCOUNTER — Encounter: Payer: Self-pay | Admitting: Adult Health

## 2013-02-21 ENCOUNTER — Ambulatory Visit (INDEPENDENT_AMBULATORY_CARE_PROVIDER_SITE_OTHER): Payer: Medicare Other | Admitting: Adult Health

## 2013-02-21 VITALS — BP 142/62 | HR 61 | Resp 12 | Wt 121.0 lb

## 2013-02-21 DIAGNOSIS — R5383 Other fatigue: Secondary | ICD-10-CM

## 2013-02-21 DIAGNOSIS — E559 Vitamin D deficiency, unspecified: Secondary | ICD-10-CM | POA: Diagnosis not present

## 2013-02-21 DIAGNOSIS — R5381 Other malaise: Secondary | ICD-10-CM | POA: Diagnosis not present

## 2013-02-21 DIAGNOSIS — R6889 Other general symptoms and signs: Secondary | ICD-10-CM

## 2013-02-21 DIAGNOSIS — R899 Unspecified abnormal finding in specimens from other organs, systems and tissues: Secondary | ICD-10-CM

## 2013-02-21 LAB — CBC WITH DIFFERENTIAL/PLATELET
BASOS ABS: 0 10*3/uL (ref 0.0–0.1)
Basophils Relative: 0.4 % (ref 0.0–3.0)
EOS ABS: 0 10*3/uL (ref 0.0–0.7)
Eosinophils Relative: 0 % (ref 0.0–5.0)
HCT: 41.7 % (ref 36.0–46.0)
HEMOGLOBIN: 13.7 g/dL (ref 12.0–15.0)
LYMPHS ABS: 2 10*3/uL (ref 0.7–4.0)
LYMPHS PCT: 37.4 % (ref 12.0–46.0)
MCHC: 32.8 g/dL (ref 30.0–36.0)
MCV: 96.2 fl (ref 78.0–100.0)
MONO ABS: 0.4 10*3/uL (ref 0.1–1.0)
Monocytes Relative: 7.9 % (ref 3.0–12.0)
NEUTROS ABS: 2.9 10*3/uL (ref 1.4–7.7)
Neutrophils Relative %: 54.3 % (ref 43.0–77.0)
Platelets: 205 10*3/uL (ref 150.0–400.0)
RBC: 4.34 Mil/uL (ref 3.87–5.11)
RDW: 12.5 % (ref 11.5–14.6)
WBC: 5.3 10*3/uL (ref 4.5–10.5)

## 2013-02-21 LAB — VITAMIN B12: Vitamin B-12: 258 pg/mL (ref 211–911)

## 2013-02-21 NOTE — Assessment & Plan Note (Addendum)
Experiencing fatigue since bladder wall hemorrhage in 11/2012. None noted since. She reports some improvement but just can't seem to get back to normal. Will check cbc to make sure her levels are not dropping. She also wanted to have her B12 checked. This was last done in 2013 so I will order this as well. Check vitamin D level. Note, pt with systolic murmur - suspect AS. Fatigue may be coming from worsening stenosis. She reports she is having repeat ECHO with Dr. Nehemiah Massed within the next month.

## 2013-02-21 NOTE — Progress Notes (Signed)
Pre visit review using our clinic review tool, if applicable. No additional management support is needed unless otherwise documented below in the visit note. 

## 2013-02-21 NOTE — Progress Notes (Signed)
   Subjective:    Patient ID: Krista Ellis, female    DOB: 17-Jul-1930, 80 y.o.   MRN: [id number]  HPI  Patient is a pleasant 80 year old female with history of recurrent urinary tract infections being followed by Dr. Jacqlyn Larsen. She reports recent episode of hematuria requiring an ED visit (11/2012). Patient is slowly recovering however, still feels fatigued. She reports that her appetite has improved as well. She denies any fever, chills, shortness of breath, chest pain.    Past Medical History  Diagnosis Date  . Hypertension   . Hyperlipidemia   . Arthritis   . Hemihypertrophy   . Myocardial infarction   . Thyroid disease   . CVA (cerebral infarction) 2003  . Peripheral vascular disease     s/p stent right leg  . Pulmonary nodule     stable on Chest CT March 2014, Followed at [facility]     Current Outpatient Prescriptions on File Prior to Visit  Medication Sig Dispense Refill  . amLODipine (NORVASC) 10 MG tablet Take 10 mg by mouth Daily.      . clopidogrel (PLAVIX) 75 MG tablet TAKE 1 TABLET BY MOUTH DAILY  90 tablet  1  . Cranberry 200 MG CAPS Take by mouth.      Marland Kitchen HYDROcodone-acetaminophen (NORCO/VICODIN) 5-325 MG per tablet Take 1 tablet by mouth every 4 (four) hours as needed for moderate pain.  60 tablet  0  . levothyroxine (SYNTHROID, LEVOTHROID) 25 MCG tablet TAKE 1 TABLET (25 MCG TOTAL) BY MOUTH DAILY.  90 tablet  3  . meloxicam (MOBIC) 7.5 MG tablet TAKE 1 TABLET (7.5 MG TOTAL) BY MOUTH DAILY.  90 tablet  0  . metoprolol (TOPROL-XL) 50 MG 24 hr tablet Take 1 tablet by mouth Twice daily.      . pravastatin (PRAVACHOL) 40 MG tablet TAKE 1 TABLET BY MOUTH EVERY EVENING.  90 tablet  1   No current facility-administered medications on file prior to visit.    Review of Systems  Constitutional: Positive for fatigue.       Appetite improved.  Respiratory: Negative.   Cardiovascular: Negative.   Genitourinary: Negative for dysuria, urgency, hematuria and difficulty urinating.    Neurological: Negative for dizziness and light-headedness.  Psychiatric/Behavioral: Negative.   All other systems reviewed and are negative.       Objective:   Physical Exam  Constitutional: She is oriented to person, place, and time. No distress.  Pleasant 80 y/o female  Cardiovascular: Normal rate and regular rhythm.  Exam reveals no gallop and no friction rub.   Murmur heard. Systolic murmur - AS  Pulmonary/Chest: Effort normal and breath sounds normal. No respiratory distress. She has no wheezes. She has no rales.  Musculoskeletal:  Ambulates with cane. Kyphosis  Neurological: She is alert and oriented to person, place, and time.  Skin: Skin is warm and dry.  Psychiatric: She has a normal mood and affect. Her behavior is normal. Judgment and thought content normal.          Assessment & Plan:

## 2013-02-22 LAB — VITAMIN D 25 HYDROXY (VIT D DEFICIENCY, FRACTURES): Vit D, 25-Hydroxy: 34 ng/mL (ref 30–89)

## 2013-02-23 DIAGNOSIS — I6330 Cerebral infarction due to thrombosis of unspecified cerebral artery: Secondary | ICD-10-CM | POA: Diagnosis not present

## 2013-02-23 DIAGNOSIS — I209 Angina pectoris, unspecified: Secondary | ICD-10-CM | POA: Diagnosis not present

## 2013-02-23 DIAGNOSIS — I658 Occlusion and stenosis of other precerebral arteries: Secondary | ICD-10-CM | POA: Diagnosis not present

## 2013-02-23 NOTE — Telephone Encounter (Signed)
Mailed unread message to pt, requested call back or reply to discuss 

## 2013-02-28 ENCOUNTER — Encounter: Payer: Self-pay | Admitting: *Deleted

## 2013-03-02 DIAGNOSIS — I119 Hypertensive heart disease without heart failure: Secondary | ICD-10-CM | POA: Diagnosis not present

## 2013-03-02 DIAGNOSIS — I2510 Atherosclerotic heart disease of native coronary artery without angina pectoris: Secondary | ICD-10-CM | POA: Diagnosis not present

## 2013-03-02 DIAGNOSIS — I6529 Occlusion and stenosis of unspecified carotid artery: Secondary | ICD-10-CM | POA: Diagnosis not present

## 2013-03-02 DIAGNOSIS — I495 Sick sinus syndrome: Secondary | ICD-10-CM | POA: Diagnosis not present

## 2013-03-09 ENCOUNTER — Encounter: Payer: Medicare Other | Admitting: Internal Medicine

## 2013-03-11 ENCOUNTER — Ambulatory Visit (INDEPENDENT_AMBULATORY_CARE_PROVIDER_SITE_OTHER): Payer: Medicare Other | Admitting: *Deleted

## 2013-03-11 ENCOUNTER — Encounter: Payer: Medicare Other | Admitting: Internal Medicine

## 2013-03-11 DIAGNOSIS — E538 Deficiency of other specified B group vitamins: Secondary | ICD-10-CM

## 2013-03-11 MED ORDER — CYANOCOBALAMIN 1000 MCG/ML IJ SOLN
1000.0000 ug | Freq: Once | INTRAMUSCULAR | Status: AC
  Administered 2013-03-11: 1000 ug via INTRAMUSCULAR

## 2013-03-14 ENCOUNTER — Encounter: Payer: Self-pay | Admitting: *Deleted

## 2013-03-16 DIAGNOSIS — D485 Neoplasm of uncertain behavior of skin: Secondary | ICD-10-CM | POA: Diagnosis not present

## 2013-03-16 DIAGNOSIS — L738 Other specified follicular disorders: Secondary | ICD-10-CM | POA: Diagnosis not present

## 2013-03-16 DIAGNOSIS — B372 Candidiasis of skin and nail: Secondary | ICD-10-CM | POA: Diagnosis not present

## 2013-03-22 ENCOUNTER — Ambulatory Visit: Payer: Self-pay | Admitting: Hematology and Oncology

## 2013-03-22 DIAGNOSIS — I10 Essential (primary) hypertension: Secondary | ICD-10-CM | POA: Diagnosis not present

## 2013-03-22 DIAGNOSIS — Z8673 Personal history of transient ischemic attack (TIA), and cerebral infarction without residual deficits: Secondary | ICD-10-CM | POA: Diagnosis not present

## 2013-03-22 DIAGNOSIS — Z79899 Other long term (current) drug therapy: Secondary | ICD-10-CM | POA: Diagnosis not present

## 2013-03-22 DIAGNOSIS — E785 Hyperlipidemia, unspecified: Secondary | ICD-10-CM | POA: Diagnosis not present

## 2013-03-22 DIAGNOSIS — J309 Allergic rhinitis, unspecified: Secondary | ICD-10-CM | POA: Diagnosis not present

## 2013-03-22 DIAGNOSIS — R5383 Other fatigue: Secondary | ICD-10-CM | POA: Diagnosis not present

## 2013-03-22 DIAGNOSIS — K5732 Diverticulitis of large intestine without perforation or abscess without bleeding: Secondary | ICD-10-CM | POA: Diagnosis not present

## 2013-03-22 DIAGNOSIS — R319 Hematuria, unspecified: Secondary | ICD-10-CM | POA: Diagnosis not present

## 2013-03-22 DIAGNOSIS — M1990 Unspecified osteoarthritis, unspecified site: Secondary | ICD-10-CM | POA: Diagnosis not present

## 2013-03-22 DIAGNOSIS — Z7982 Long term (current) use of aspirin: Secondary | ICD-10-CM | POA: Diagnosis not present

## 2013-03-22 DIAGNOSIS — I83893 Varicose veins of bilateral lower extremities with other complications: Secondary | ICD-10-CM | POA: Diagnosis not present

## 2013-03-22 DIAGNOSIS — R918 Other nonspecific abnormal finding of lung field: Secondary | ICD-10-CM | POA: Diagnosis not present

## 2013-03-22 DIAGNOSIS — R5381 Other malaise: Secondary | ICD-10-CM | POA: Diagnosis not present

## 2013-03-22 DIAGNOSIS — Z87440 Personal history of urinary (tract) infections: Secondary | ICD-10-CM | POA: Diagnosis not present

## 2013-03-22 DIAGNOSIS — M25559 Pain in unspecified hip: Secondary | ICD-10-CM | POA: Diagnosis not present

## 2013-03-22 DIAGNOSIS — K219 Gastro-esophageal reflux disease without esophagitis: Secondary | ICD-10-CM | POA: Diagnosis not present

## 2013-03-22 DIAGNOSIS — K277 Chronic peptic ulcer, site unspecified, without hemorrhage or perforation: Secondary | ICD-10-CM | POA: Diagnosis not present

## 2013-03-24 DIAGNOSIS — R918 Other nonspecific abnormal finding of lung field: Secondary | ICD-10-CM | POA: Diagnosis not present

## 2013-03-24 DIAGNOSIS — M25559 Pain in unspecified hip: Secondary | ICD-10-CM | POA: Diagnosis not present

## 2013-03-24 DIAGNOSIS — R5381 Other malaise: Secondary | ICD-10-CM | POA: Diagnosis not present

## 2013-03-24 DIAGNOSIS — R5383 Other fatigue: Secondary | ICD-10-CM | POA: Diagnosis not present

## 2013-03-24 DIAGNOSIS — J984 Other disorders of lung: Secondary | ICD-10-CM | POA: Diagnosis not present

## 2013-03-24 DIAGNOSIS — Z79899 Other long term (current) drug therapy: Secondary | ICD-10-CM | POA: Diagnosis not present

## 2013-03-24 DIAGNOSIS — R319 Hematuria, unspecified: Secondary | ICD-10-CM | POA: Diagnosis not present

## 2013-03-24 DIAGNOSIS — Z7982 Long term (current) use of aspirin: Secondary | ICD-10-CM | POA: Diagnosis not present

## 2013-03-31 ENCOUNTER — Telehealth: Payer: Self-pay | Admitting: *Deleted

## 2013-03-31 NOTE — Telephone Encounter (Signed)
Patient left message on voicemail stating they will not fill the Hydrocodone/Acetomenophen. Would like someone to call her back. Returned patient call, no answer or voicemail to leave message.

## 2013-04-04 DIAGNOSIS — I70219 Atherosclerosis of native arteries of extremities with intermittent claudication, unspecified extremity: Secondary | ICD-10-CM | POA: Diagnosis not present

## 2013-04-04 DIAGNOSIS — I10 Essential (primary) hypertension: Secondary | ICD-10-CM | POA: Diagnosis not present

## 2013-04-04 DIAGNOSIS — E785 Hyperlipidemia, unspecified: Secondary | ICD-10-CM | POA: Diagnosis not present

## 2013-04-04 DIAGNOSIS — I6529 Occlusion and stenosis of unspecified carotid artery: Secondary | ICD-10-CM | POA: Diagnosis not present

## 2013-04-06 NOTE — Telephone Encounter (Signed)
Patient has an appointment tomorrow, will get prescription then. Informed patient when she call requesting a refill, she can still contact the pharmacy to have them send [place] the request. If she does leave it on the voicemail then she needs to leave all her information and exactly what she needs done.

## 2013-04-07 ENCOUNTER — Ambulatory Visit (INDEPENDENT_AMBULATORY_CARE_PROVIDER_SITE_OTHER): Payer: Medicare Other | Admitting: Internal Medicine

## 2013-04-07 ENCOUNTER — Encounter: Payer: Self-pay | Admitting: Internal Medicine

## 2013-04-07 VITALS — BP 120/60 | HR 65 | Temp 97.9°F | Wt 118.0 lb

## 2013-04-07 DIAGNOSIS — E039 Hypothyroidism, unspecified: Secondary | ICD-10-CM

## 2013-04-07 DIAGNOSIS — M1990 Unspecified osteoarthritis, unspecified site: Secondary | ICD-10-CM | POA: Diagnosis not present

## 2013-04-07 DIAGNOSIS — R5381 Other malaise: Secondary | ICD-10-CM

## 2013-04-07 DIAGNOSIS — D510 Vitamin B12 deficiency anemia due to intrinsic factor deficiency: Secondary | ICD-10-CM | POA: Diagnosis not present

## 2013-04-07 DIAGNOSIS — I10 Essential (primary) hypertension: Secondary | ICD-10-CM

## 2013-04-07 DIAGNOSIS — R5383 Other fatigue: Secondary | ICD-10-CM | POA: Diagnosis not present

## 2013-04-07 LAB — LIPID PANEL
CHOL/HDL RATIO: 3
Cholesterol: 140 mg/dL (ref 0–200)
HDL: 51.4 mg/dL (ref >39.00)
LDL Cholesterol: 74 mg/dL (ref 0–99)
Triglycerides: 72 mg/dL (ref 0.0–149.0)
VLDL: 14.4 mg/dL (ref 0.0–40.0)

## 2013-04-07 LAB — COMPREHENSIVE METABOLIC PANEL
ALT: 11 U/L (ref 0–35)
AST: 22 U/L (ref 0–37)
Albumin: 4.3 g/dL (ref 3.5–5.2)
Alkaline Phosphatase: 50 U/L (ref 39–117)
BUN: 29 mg/dL — ABNORMAL HIGH (ref 6–23)
CALCIUM: 9.9 mg/dL (ref 8.4–10.5)
CHLORIDE: 103 meq/L (ref 96–112)
CO2: 27 meq/L (ref 19–32)
Creatinine, Ser: 1 mg/dL (ref 0.4–1.2)
GFR: 59.31 mL/min — AB (ref >60.00)
Glucose, Bld: 96 mg/dL (ref 70–99)
Potassium: 4.6 mEq/L (ref 3.5–5.1)
SODIUM: 139 meq/L (ref 135–145)
Total Bilirubin: 0.8 mg/dL (ref 0.3–1.2)
Total Protein: 7.2 g/dL (ref 6.0–8.3)

## 2013-04-07 LAB — CBC WITH DIFFERENTIAL/PLATELET
Basophils Absolute: 0 10*3/uL (ref 0.0–0.1)
Basophils Relative: 0.3 % (ref 0.0–3.0)
Eosinophils Absolute: 0 10*3/uL (ref 0.0–0.7)
Eosinophils Relative: 0 % (ref 0.0–5.0)
HCT: 42.9 % (ref 36.0–46.0)
Hemoglobin: 14.1 g/dL (ref 12.0–15.0)
LYMPHS PCT: 33.8 % (ref 12.0–46.0)
Lymphs Abs: 2.1 10*3/uL (ref 0.7–4.0)
MCHC: 33 g/dL (ref 30.0–36.0)
MCV: 94.1 fl (ref 78.0–100.0)
MONOS PCT: 9.1 % (ref 3.0–12.0)
Monocytes Absolute: 0.6 10*3/uL (ref 0.1–1.0)
NEUTROS PCT: 56.8 % (ref 43.0–77.0)
Neutro Abs: 3.6 10*3/uL (ref 1.4–7.7)
PLATELETS: 212 10*3/uL (ref 150.0–400.0)
RBC: 4.56 Mil/uL (ref 3.87–5.11)
RDW: 12.8 % (ref 11.5–14.6)
WBC: 6.3 10*3/uL (ref 4.5–10.5)

## 2013-04-07 LAB — VITAMIN B12: VITAMIN B 12: 343 pg/mL (ref 211–911)

## 2013-04-07 LAB — TSH: TSH: 2.49 u[IU]/mL (ref 0.35–5.50)

## 2013-04-07 MED ORDER — HYDROCODONE-ACETAMINOPHEN 5-325 MG PO TABS: 1.0000 | ORAL_TABLET | ORAL | Status: DC | PRN

## 2013-04-07 MED ORDER — CYANOCOBALAMIN 1000 MCG/ML IJ SOLN
1000.0000 ug | Freq: Once | INTRAMUSCULAR | Status: AC
  Administered 2013-04-07: 1000 ug via INTRAMUSCULAR

## 2013-04-07 NOTE — Assessment & Plan Note (Signed)
Persistent fatigue. Most likely secondary to inadequate treatment of B12 deficiency. She only had one B12 injection after evaluation last month. Will start daily B12 injections this week and then plan for weekly injections x3 then monthly injections. Will also request recent stress test results. Follow up 4 weeks.

## 2013-04-07 NOTE — Progress Notes (Signed)
Subjective:    Patient ID: Krista Ellis, female    DOB: 1930/03/26, 80 y.o.   MRN: [id number]  HPI 80YO female presents for follow up.  Generally feeling tired. Continues to have weakness, staggering gait. No falls. No recurrent urinary bleeding. No other focal symptoms. Questions whether she may have persistent B12 deficiency leading to symptoms.  Recently seen by Dr. Ronalee Belts. Scheduled for arterial dopplers LE and carotids on April 6th.  Also had stress test with Dr. Nehemiah Massed earlier this year. Reports testing was normal.  Continues to have severe aching pain at times in back and knees. Made worse with physical activity.Taking Meloxicam with minimal improvement. Occasionally uses hydrocodone for severe pain, such as on rainy days. Notes improvement with this.  Review of Systems  Constitutional: Negative for fever, chills, appetite change, fatigue and unexpected weight change.  HENT: Negative for congestion, ear pain, sinus pressure, sore throat, trouble swallowing and voice change.   Eyes: Negative for visual disturbance.  Respiratory: Negative for cough, shortness of breath, wheezing and stridor.   Cardiovascular: Negative for chest pain, palpitations and leg swelling.  Gastrointestinal: Negative for nausea, vomiting, abdominal pain, diarrhea, constipation, blood in stool, abdominal distention and anal bleeding.  Genitourinary: Negative for dysuria and flank pain.  Musculoskeletal: Positive for gait problem. Negative for arthralgias, myalgias and neck pain.  Skin: Negative for color change and rash.  Neurological: Positive for weakness. Negative for dizziness and headaches.  Hematological: Negative for adenopathy. Does not bruise/bleed easily.  Psychiatric/Behavioral: Negative for suicidal ideas, sleep disturbance and dysphoric mood. The patient is not nervous/anxious.        Objective:    BP 120/60  Pulse 65  Temp(Src) 97.9 F (36.6 C) (Oral)  Wt 118 lb (53.524 kg)   SpO2 97% Physical Exam  Constitutional: She is oriented to person, place, and time. She appears well-developed and well-nourished. No distress.  HENT:  Head: Normocephalic and atraumatic.  Right Ear: External ear normal.  Left Ear: External ear normal.  Nose: Nose normal.  Mouth/Throat: Oropharynx is clear and moist. No oropharyngeal exudate.  Eyes: Conjunctivae are normal. Pupils are equal, round, and reactive to light. Right eye exhibits no discharge. Left eye exhibits no discharge. No scleral icterus.  Neck: Normal range of motion. Neck supple. No tracheal deviation present. No thyromegaly present.  Cardiovascular: Normal rate, regular rhythm, normal heart sounds and intact distal pulses.  Exam reveals no gallop and no friction rub.   No murmur heard. Pulmonary/Chest: Effort normal and breath sounds normal. No accessory muscle usage. Not tachypneic. No respiratory distress. She has no decreased breath sounds. She has no wheezes. She has no rhonchi. She has no rales. She exhibits no tenderness.  Musculoskeletal: Normal range of motion. She exhibits no edema and no tenderness.  Lymphadenopathy:    She has no cervical adenopathy.  Neurological: She is alert and oriented to person, place, and time. She displays no atrophy and no tremor. No cranial nerve deficit or sensory deficit. She exhibits normal muscle tone. Gait (unsteady. Requires assistance with cane.) abnormal. Coordination normal.  Skin: Skin is warm and dry. No rash noted. She is not diaphoretic. No erythema. No pallor.  Psychiatric: She has a normal mood and affect. Her behavior is normal. Judgment and thought content normal.          Assessment & Plan:   Problem List Items Addressed This Visit   Fatigue - Primary     Persistent fatigue. Most likely secondary to inadequate treatment  of B12 deficiency. She only had one B12 injection after evaluation last month. Will start daily B12 injections this week and then plan for weekly  injections x3 then monthly injections. Will also request recent stress test results. Follow up 4 weeks.    Relevant Orders      Comprehensive metabolic panel (Completed)      TSH (Completed)      Vit D  25 hydroxy (rtn osteoporosis monitoring)      Lipid panel (Completed)   Hypertension      BP Readings from Last 3 Encounters:  04/07/13 120/60  02/21/13 142/62  12/10/12 126/60   BP well controlled on current medications. Will continue.    Hypothyroidism     TSH normal on labs. Continue current dose of Levothyroxine.    Osteoarthritis     Chronic OA pain in knees, back. Minimal improvement with meloxicam. Will continue prn Hydrocodone for severe pain.    Relevant Medications      HYDROcodone-acetaminophen (NORCO/VICODIN) 5-325 MG per tablet    Other Visit Diagnoses   Pernicious anemia        Relevant Medications       cyanocobalamin ((VITAMIN B-12)) injection 1,000 mcg (Completed)    Other Relevant Orders       CBC with Differential (Completed)       B12 (Completed)        Return in about 4 weeks (around 05/05/2013) for Wellness Visit.

## 2013-04-07 NOTE — Patient Instructions (Signed)
Start B12 shots today, tomorrow and Monday.  Then weekly B12 shots for 3 weeks, then monthly.  Follow up 1 month for physical.

## 2013-04-07 NOTE — Progress Notes (Signed)
Pre visit review using our clinic review tool, if applicable. No additional management support is needed unless otherwise documented below in the visit note. 

## 2013-04-07 NOTE — Assessment & Plan Note (Signed)
Chronic OA pain in knees, back. Minimal improvement with meloxicam. Will continue prn Hydrocodone for severe pain.

## 2013-04-07 NOTE — Assessment & Plan Note (Signed)
BP Readings from Last 3 Encounters:  04/07/13 120/60  02/21/13 142/62  12/10/12 126/60   BP well controlled on current medications. Will continue.

## 2013-04-07 NOTE — Assessment & Plan Note (Signed)
TSH normal on labs. Continue current dose of Levothyroxine.

## 2013-04-08 ENCOUNTER — Telehealth: Payer: Self-pay | Admitting: Internal Medicine

## 2013-04-08 ENCOUNTER — Ambulatory Visit (INDEPENDENT_AMBULATORY_CARE_PROVIDER_SITE_OTHER): Payer: Medicare Other | Admitting: *Deleted

## 2013-04-08 DIAGNOSIS — E538 Deficiency of other specified B group vitamins: Secondary | ICD-10-CM

## 2013-04-08 LAB — VITAMIN D 25 HYDROXY (VIT D DEFICIENCY, FRACTURES): Vit D, 25-Hydroxy: 36 ng/mL (ref 30–89)

## 2013-04-08 MED ORDER — CYANOCOBALAMIN 1000 MCG/ML IJ SOLN
1000.0000 ug | Freq: Once | INTRAMUSCULAR | Status: AC
  Administered 2013-04-08: 1000 ug via INTRAMUSCULAR

## 2013-04-08 NOTE — Telephone Encounter (Signed)
Relevant patient education assigned to patient using Emmi. ° °

## 2013-04-11 ENCOUNTER — Ambulatory Visit (INDEPENDENT_AMBULATORY_CARE_PROVIDER_SITE_OTHER): Payer: Medicare Other | Admitting: *Deleted

## 2013-04-11 DIAGNOSIS — E538 Deficiency of other specified B group vitamins: Secondary | ICD-10-CM | POA: Diagnosis not present

## 2013-04-11 MED ORDER — CYANOCOBALAMIN 1000 MCG/ML IJ SOLN
1000.0000 ug | Freq: Once | INTRAMUSCULAR | Status: AC
  Administered 2013-04-11: 1000 ug via INTRAMUSCULAR

## 2013-04-14 DIAGNOSIS — L738 Other specified follicular disorders: Secondary | ICD-10-CM | POA: Diagnosis not present

## 2013-04-14 DIAGNOSIS — B372 Candidiasis of skin and nail: Secondary | ICD-10-CM | POA: Diagnosis not present

## 2013-04-19 ENCOUNTER — Ambulatory Visit (INDEPENDENT_AMBULATORY_CARE_PROVIDER_SITE_OTHER): Payer: Medicare Other | Admitting: *Deleted

## 2013-04-19 DIAGNOSIS — E538 Deficiency of other specified B group vitamins: Secondary | ICD-10-CM | POA: Diagnosis not present

## 2013-04-19 MED ORDER — CYANOCOBALAMIN 1000 MCG/ML IJ SOLN
1000.0000 ug | Freq: Once | INTRAMUSCULAR | Status: AC
  Administered 2013-04-19: 1000 ug via INTRAMUSCULAR

## 2013-04-20 ENCOUNTER — Ambulatory Visit: Payer: Self-pay | Admitting: Hematology and Oncology

## 2013-04-25 DIAGNOSIS — I872 Venous insufficiency (chronic) (peripheral): Secondary | ICD-10-CM | POA: Diagnosis not present

## 2013-04-25 DIAGNOSIS — M79609 Pain in unspecified limb: Secondary | ICD-10-CM | POA: Diagnosis not present

## 2013-04-25 DIAGNOSIS — I70219 Atherosclerosis of native arteries of extremities with intermittent claudication, unspecified extremity: Secondary | ICD-10-CM | POA: Diagnosis not present

## 2013-04-25 DIAGNOSIS — I6529 Occlusion and stenosis of unspecified carotid artery: Secondary | ICD-10-CM | POA: Diagnosis not present

## 2013-04-27 ENCOUNTER — Encounter: Payer: Self-pay | Admitting: *Deleted

## 2013-04-28 DIAGNOSIS — M19019 Primary osteoarthritis, unspecified shoulder: Secondary | ICD-10-CM | POA: Diagnosis not present

## 2013-05-02 DIAGNOSIS — I119 Hypertensive heart disease without heart failure: Secondary | ICD-10-CM | POA: Diagnosis not present

## 2013-05-02 DIAGNOSIS — I70219 Atherosclerosis of native arteries of extremities with intermittent claudication, unspecified extremity: Secondary | ICD-10-CM | POA: Diagnosis not present

## 2013-05-02 DIAGNOSIS — I2510 Atherosclerotic heart disease of native coronary artery without angina pectoris: Secondary | ICD-10-CM | POA: Diagnosis not present

## 2013-05-02 DIAGNOSIS — I495 Sick sinus syndrome: Secondary | ICD-10-CM | POA: Diagnosis not present

## 2013-05-03 ENCOUNTER — Ambulatory Visit: Payer: Self-pay | Admitting: Vascular Surgery

## 2013-05-03 DIAGNOSIS — R0989 Other specified symptoms and signs involving the circulatory and respiratory systems: Secondary | ICD-10-CM | POA: Diagnosis not present

## 2013-05-03 DIAGNOSIS — I499 Cardiac arrhythmia, unspecified: Secondary | ICD-10-CM | POA: Diagnosis not present

## 2013-05-03 DIAGNOSIS — I10 Essential (primary) hypertension: Secondary | ICD-10-CM | POA: Diagnosis not present

## 2013-05-03 DIAGNOSIS — I6529 Occlusion and stenosis of unspecified carotid artery: Secondary | ICD-10-CM | POA: Diagnosis not present

## 2013-05-03 DIAGNOSIS — I872 Venous insufficiency (chronic) (peripheral): Secondary | ICD-10-CM | POA: Diagnosis not present

## 2013-05-03 DIAGNOSIS — Z79899 Other long term (current) drug therapy: Secondary | ICD-10-CM | POA: Diagnosis not present

## 2013-05-03 DIAGNOSIS — I8390 Asymptomatic varicose veins of unspecified lower extremity: Secondary | ICD-10-CM | POA: Diagnosis not present

## 2013-05-03 DIAGNOSIS — E785 Hyperlipidemia, unspecified: Secondary | ICD-10-CM | POA: Diagnosis not present

## 2013-05-03 DIAGNOSIS — Z87891 Personal history of nicotine dependence: Secondary | ICD-10-CM | POA: Diagnosis not present

## 2013-05-03 DIAGNOSIS — I70229 Atherosclerosis of native arteries of extremities with rest pain, unspecified extremity: Secondary | ICD-10-CM | POA: Diagnosis not present

## 2013-05-03 LAB — CREATININE, SERUM
Creatinine: 0.93 mg/dL (ref 0.60–1.30)
EGFR (African American): >60
EGFR (Non-African Amer.): 58 — ABNORMAL LOW

## 2013-05-03 LAB — BUN: BUN: 25 mg/dL — ABNORMAL HIGH (ref 7–18)

## 2013-05-04 ENCOUNTER — Other Ambulatory Visit: Payer: Self-pay | Admitting: Internal Medicine

## 2013-05-04 ENCOUNTER — Telehealth: Payer: Self-pay | Admitting: Internal Medicine

## 2013-05-04 NOTE — Telephone Encounter (Signed)
She did not get her 3rd weekly injection yet, so she could come anytime.

## 2013-05-04 NOTE — Telephone Encounter (Signed)
When does the patient need to have her next B-12.

## 2013-05-05 ENCOUNTER — Encounter: Payer: Medicare Other | Admitting: Internal Medicine

## 2013-05-06 NOTE — Telephone Encounter (Signed)
I called and inform the patient that she can come to get her injection at anytime.

## 2013-05-10 ENCOUNTER — Ambulatory Visit (INDEPENDENT_AMBULATORY_CARE_PROVIDER_SITE_OTHER): Payer: Medicare Other | Admitting: *Deleted

## 2013-05-10 DIAGNOSIS — E538 Deficiency of other specified B group vitamins: Secondary | ICD-10-CM

## 2013-05-10 MED ORDER — CYANOCOBALAMIN 1000 MCG/ML IJ SOLN
1000.0000 ug | Freq: Once | INTRAMUSCULAR | Status: AC
  Administered 2013-05-10: 1000 ug via INTRAMUSCULAR

## 2013-05-16 DIAGNOSIS — I70219 Atherosclerosis of native arteries of extremities with intermittent claudication, unspecified extremity: Secondary | ICD-10-CM | POA: Diagnosis not present

## 2013-05-16 DIAGNOSIS — E785 Hyperlipidemia, unspecified: Secondary | ICD-10-CM | POA: Diagnosis not present

## 2013-05-16 DIAGNOSIS — M79609 Pain in unspecified limb: Secondary | ICD-10-CM | POA: Diagnosis not present

## 2013-05-16 DIAGNOSIS — I10 Essential (primary) hypertension: Secondary | ICD-10-CM | POA: Diagnosis not present

## 2013-05-17 ENCOUNTER — Emergency Department: Payer: Self-pay | Admitting: Emergency Medicine

## 2013-05-17 DIAGNOSIS — Z882 Allergy status to sulfonamides status: Secondary | ICD-10-CM | POA: Diagnosis not present

## 2013-05-17 DIAGNOSIS — Z880 Allergy status to penicillin: Secondary | ICD-10-CM | POA: Diagnosis not present

## 2013-05-17 DIAGNOSIS — K219 Gastro-esophageal reflux disease without esophagitis: Secondary | ICD-10-CM | POA: Diagnosis not present

## 2013-05-17 DIAGNOSIS — Z96649 Presence of unspecified artificial hip joint: Secondary | ICD-10-CM | POA: Diagnosis not present

## 2013-05-17 DIAGNOSIS — M79609 Pain in unspecified limb: Secondary | ICD-10-CM | POA: Diagnosis not present

## 2013-05-17 DIAGNOSIS — M899 Disorder of bone, unspecified: Secondary | ICD-10-CM | POA: Diagnosis not present

## 2013-05-17 DIAGNOSIS — I10 Essential (primary) hypertension: Secondary | ICD-10-CM | POA: Diagnosis not present

## 2013-05-17 DIAGNOSIS — Z888 Allergy status to other drugs, medicaments and biological substances status: Secondary | ICD-10-CM | POA: Diagnosis not present

## 2013-05-17 DIAGNOSIS — Z7902 Long term (current) use of antithrombotics/antiplatelets: Secondary | ICD-10-CM | POA: Diagnosis not present

## 2013-05-17 DIAGNOSIS — Z79899 Other long term (current) drug therapy: Secondary | ICD-10-CM | POA: Diagnosis not present

## 2013-05-17 DIAGNOSIS — M949 Disorder of cartilage, unspecified: Secondary | ICD-10-CM | POA: Diagnosis not present

## 2013-05-17 DIAGNOSIS — M169 Osteoarthritis of hip, unspecified: Secondary | ICD-10-CM | POA: Diagnosis not present

## 2013-05-17 DIAGNOSIS — N390 Urinary tract infection, site not specified: Secondary | ICD-10-CM | POA: Diagnosis not present

## 2013-05-17 DIAGNOSIS — M1610 Unilateral primary osteoarthritis, unspecified hip: Secondary | ICD-10-CM | POA: Diagnosis not present

## 2013-05-17 DIAGNOSIS — Z9079 Acquired absence of other genital organ(s): Secondary | ICD-10-CM | POA: Diagnosis not present

## 2013-05-17 DIAGNOSIS — Z7982 Long term (current) use of aspirin: Secondary | ICD-10-CM | POA: Diagnosis not present

## 2013-05-17 DIAGNOSIS — Z9089 Acquired absence of other organs: Secondary | ICD-10-CM | POA: Diagnosis not present

## 2013-05-17 LAB — COMPREHENSIVE METABOLIC PANEL
ALBUMIN: 3.5 g/dL (ref 3.4–5.0)
ALT: 18 U/L (ref 12–78)
AST: 20 U/L (ref 15–37)
Alkaline Phosphatase: 51 U/L
Anion Gap: 5 — ABNORMAL LOW (ref 7–16)
BUN: 21 mg/dL — ABNORMAL HIGH (ref 7–18)
Bilirubin,Total: 0.5 mg/dL (ref 0.2–1.0)
CALCIUM: 9.8 mg/dL (ref 8.5–10.1)
CHLORIDE: 107 mmol/L (ref 98–107)
Co2: 26 mmol/L (ref 21–32)
Creatinine: 0.7 mg/dL (ref 0.60–1.30)
EGFR (African American): >60
EGFR (Non-African Amer.): >60
GLUCOSE: 109 mg/dL — AB (ref 65–99)
OSMOLALITY: 279 (ref 275–301)
POTASSIUM: 4.4 mmol/L (ref 3.5–5.1)
Sodium: 138 mmol/L (ref 136–145)
Total Protein: 6.6 g/dL (ref 6.4–8.2)

## 2013-05-17 LAB — URINALYSIS, COMPLETE
Bilirubin,UR: NEGATIVE
Glucose,UR: NEGATIVE mg/dL (ref 0–75)
Ketone: NEGATIVE
Nitrite: NEGATIVE
PH: 7 (ref 4.5–8.0)
Protein: NEGATIVE
SQUAMOUS EPITHELIAL: NONE SEEN
Specific Gravity: 1.008 (ref 1.003–1.030)

## 2013-05-17 LAB — CBC WITH DIFFERENTIAL/PLATELET
BASOS ABS: 0 10*3/uL (ref 0.0–0.1)
Basophil %: 0.2 %
EOS ABS: 0 10*3/uL (ref 0.0–0.7)
EOS PCT: 0 %
HCT: 36.8 % (ref 35.0–47.0)
HGB: 12.1 g/dL (ref 12.0–16.0)
LYMPHS ABS: 1.7 10*3/uL (ref 1.0–3.6)
LYMPHS PCT: 36.2 %
MCH: 32.1 pg (ref 26.0–34.0)
MCHC: 33 g/dL (ref 32.0–36.0)
MCV: 97 fL (ref 80–100)
MONO ABS: 0.5 x10 3/mm (ref 0.2–0.9)
Monocyte %: 11.1 %
Neutrophil #: 2.4 10*3/uL (ref 1.4–6.5)
Neutrophil %: 52.5 %
Platelet: 189 10*3/uL (ref 150–440)
RBC: 3.79 10*6/uL — AB (ref 3.80–5.20)
RDW: 14.2 % (ref 11.5–14.5)
WBC: 4.6 10*3/uL (ref 3.6–11.0)

## 2013-05-17 LAB — CK: CK, Total: 64 U/L

## 2013-05-19 DIAGNOSIS — E785 Hyperlipidemia, unspecified: Secondary | ICD-10-CM | POA: Diagnosis not present

## 2013-05-19 DIAGNOSIS — N3020 Other chronic cystitis without hematuria: Secondary | ICD-10-CM | POA: Diagnosis not present

## 2013-05-19 DIAGNOSIS — I10 Essential (primary) hypertension: Secondary | ICD-10-CM | POA: Diagnosis not present

## 2013-05-19 DIAGNOSIS — T148XXA Other injury of unspecified body region, initial encounter: Secondary | ICD-10-CM | POA: Diagnosis not present

## 2013-05-19 DIAGNOSIS — I8310 Varicose veins of unspecified lower extremity with inflammation: Secondary | ICD-10-CM | POA: Diagnosis not present

## 2013-05-28 ENCOUNTER — Other Ambulatory Visit: Payer: Self-pay | Admitting: Internal Medicine

## 2013-05-30 DIAGNOSIS — M79609 Pain in unspecified limb: Secondary | ICD-10-CM | POA: Diagnosis not present

## 2013-05-30 DIAGNOSIS — I10 Essential (primary) hypertension: Secondary | ICD-10-CM | POA: Diagnosis not present

## 2013-05-30 DIAGNOSIS — I739 Peripheral vascular disease, unspecified: Secondary | ICD-10-CM | POA: Diagnosis not present

## 2013-06-01 ENCOUNTER — Ambulatory Visit (INDEPENDENT_AMBULATORY_CARE_PROVIDER_SITE_OTHER): Payer: Medicare Other | Admitting: Internal Medicine

## 2013-06-01 ENCOUNTER — Encounter: Payer: Self-pay | Admitting: Internal Medicine

## 2013-06-01 VITALS — BP 142/62 | HR 62 | Temp 98.3°F | Ht 60.8 in | Wt 119.0 lb

## 2013-06-01 DIAGNOSIS — Z0000 Encounter for general adult medical examination without abnormal findings: Secondary | ICD-10-CM | POA: Diagnosis not present

## 2013-06-01 DIAGNOSIS — I70209 Unspecified atherosclerosis of native arteries of extremities, unspecified extremity: Secondary | ICD-10-CM | POA: Diagnosis not present

## 2013-06-01 DIAGNOSIS — E538 Deficiency of other specified B group vitamins: Secondary | ICD-10-CM | POA: Diagnosis not present

## 2013-06-01 MED ORDER — CYANOCOBALAMIN 1000 MCG/ML IJ SOLN
1000.0000 ug | Freq: Once | INTRAMUSCULAR | Status: AC
  Administered 2013-06-01: 1000 ug via INTRAMUSCULAR

## 2013-06-01 NOTE — Assessment & Plan Note (Signed)
General medical exam including breast exam normal today except as noted. Discussed pros and cons of continuing mammogram screening. She would like to hold of on mammograms for now. Reviewed recent labs which were stable. Will continue current medications. Encouraged use of walker in the home for falls prevention. Encouraged healthy diet and regular exercise.

## 2013-06-01 NOTE — Progress Notes (Signed)
Subjective:    Patient ID: Krista Ellis, female    DOB: 1930-09-27, 80 y.o.   MRN: [id number]  HPI The patient is here for annual Medicare wellness examination and management of other chronic and acute problems.   The risk factors are reflected in the social history.  The roster of all physicians providing medical care to patient - is listed in the Snapshot section of the chart.  Activities of daily living:  The patient is 100% independent in all ADLs: dressing, toileting, feeding as well as independent mobility. Using a walker in her home. Has had 2 falls at home, from standing onto a carpeted floors. Continues to drive herself.  Home safety : The patient has smoke detectors in the home. They wear seatbelts.  There are no firearms at home. There is no violence in the home.  Lives alone for last 24 years.  There is no risks for hepatitis, STDs or HIV. There is no history of blood transfusion. They have no travel history to infectious disease endemic areas of the world.  The patient has not seen their dentist in the last six month. Wears dentures. (Previous dentist - Dr. Caryn Bee) They have seen their eye doctor in the last year. (Dr. Thomasene Ripple) No hearing issues. They have deferred audiologic testing in the last year.   Urologist - Dr. Jacqlyn Larsen Vascular - Dr. Ronalee Belts Dermatologist - Dr. Evorn Gong Cardiologist - Dr. Nehemiah Massed  They do not  have excessive sun exposure. Discussed the need for sun protection: hats, long sleeves and use of sunscreen if there is significant sun exposure.   Diet: the importance of a healthy diet is discussed. They do have a healthy diet.  The benefits of regular aerobic exercise were discussed. She does exercises for legs, Rx by PT. Limited walking because of back pain.  Depression screen: there are no signs or vegative symptoms of depression- irritability, change in appetite, anhedonia, sadness/tearfullness.  Cognitive assessment: the patient manages all  their financial and personal affairs and is actively engaged. They could relate day,date,year and events.Manages finances independently.  HCPOA - Kela Millin, son-in-law.  The following portions of the patient's history were reviewed and updated as appropriate: allergies, current medications, past family history, past medical history,  past surgical history, past social history  and problem list.  Visual acuity was not assessed per patient preference since she has regular follow up with her ophthalmologist. Hearing and body mass index were assessed and reviewed.   During the course of the visit the patient was educated and counseled about appropriate screening and preventive services including : fall prevention , diabetes screening, nutrition counseling, colorectal cancer screening, and recommended immunizations.    Recent had arterial stent placed in right femoral artery 4/14 with Dr. Ronalee Belts. Developed a hematoma, was seen in ED on 4/27. Treated with compression stockings. Swelling in leg has improved. However, balance has been off recently.   Outpatient Encounter Prescriptions as of 06/01/2013  Medication Sig  . amLODipine (NORVASC) 10 MG tablet Take 10 mg by mouth Daily.  . clopidogrel (PLAVIX) 75 MG tablet TAKE 1 TABLET BY MOUTH DAILY  . Cranberry 200 MG CAPS Take by mouth.  Marland Kitchen HYDROcodone-acetaminophen (NORCO/VICODIN) 5-325 MG per tablet Take 1 tablet by mouth every 4 (four) hours as needed for moderate pain.  Marland Kitchen levothyroxine (SYNTHROID, LEVOTHROID) 25 MCG tablet TAKE 1 TABLET (25 MCG TOTAL) BY MOUTH DAILY.  . meloxicam (MOBIC) 7.5 MG tablet TAKE 1 TABLET (7.5 MG TOTAL) BY MOUTH DAILY.  Marland Kitchen  metoprolol (TOPROL-XL) 50 MG 24 hr tablet Take 1 tablet by mouth Twice daily.  . pravastatin (PRAVACHOL) 40 MG tablet TAKE 1 TABLET BY MOUTH EVERY EVENING.  . [EXPIRED] cyanocobalamin ((VITAMIN B-12)) injection 1,000 mcg    BP 142/62  Pulse 62  Temp(Src) 98.3 F (36.8 C) (Oral)  Ht 5' 0.8" (1.544 m)   Wt 119 lb (53.978 kg)  BMI 22.64 kg/m2  SpO2 96%  Review of Systems  Constitutional: Negative for fever, chills, appetite change, fatigue and unexpected weight change.  HENT: Negative for congestion, ear pain, sinus pressure, sore throat, trouble swallowing and voice change.   Eyes: Negative for visual disturbance.  Respiratory: Negative for cough, shortness of breath, wheezing and stridor.   Cardiovascular: Positive for leg swelling. Negative for chest pain and palpitations.  Gastrointestinal: Negative for nausea, vomiting, abdominal pain, diarrhea, constipation, blood in stool, abdominal distention and anal bleeding.  Genitourinary: Negative for dysuria and flank pain.  Musculoskeletal: Positive for arthralgias, gait problem and myalgias. Negative for neck pain.  Skin: Negative for color change and rash.  Neurological: Negative for dizziness and headaches.  Hematological: Negative for adenopathy. Does not bruise/bleed easily.  Psychiatric/Behavioral: Negative for suicidal ideas, sleep disturbance and dysphoric mood. The patient is not nervous/anxious.        Objective:   Physical Exam  Constitutional: She is oriented to person, place, and time. She appears well-developed and well-nourished. No distress.  HENT:  Head: Normocephalic and atraumatic.  Right Ear: External ear normal.  Left Ear: External ear normal.  Nose: Nose normal.  Mouth/Throat: Oropharynx is clear and moist. No oropharyngeal exudate.  Eyes: Conjunctivae are normal. Pupils are equal, round, and reactive to light. Right eye exhibits no discharge. Left eye exhibits no discharge. No scleral icterus.  Neck: Normal range of motion. Neck supple. No tracheal deviation present. No thyromegaly present.  Cardiovascular: Normal rate, regular rhythm, normal heart sounds and intact distal pulses.  Exam reveals no gallop and no friction rub.   No murmur heard. Pulmonary/Chest: Effort normal and breath sounds normal. No accessory  muscle usage. Not tachypneic. No respiratory distress. She has no decreased breath sounds. She has no wheezes. She has no rhonchi. She has no rales. She exhibits no tenderness. Right breast exhibits no inverted nipple, no mass, no nipple discharge, no skin change and no tenderness. Left breast exhibits no inverted nipple, no mass, no nipple discharge, no skin change and no tenderness. Breasts are symmetrical.  Abdominal: Soft. Bowel sounds are normal. She exhibits no distension and no mass. There is no tenderness. There is no rebound and no guarding.  Musculoskeletal: Normal range of motion. She exhibits edema (right lower leg, trace). She exhibits no tenderness.  Lymphadenopathy:    She has no cervical adenopathy.  Neurological: She is alert and oriented to person, place, and time. No cranial nerve deficit. She exhibits normal muscle tone. Coordination normal.  Skin: Skin is warm and dry. Ecchymosis noted. No rash noted. She is not diaphoretic. No erythema. No pallor.     Psychiatric: She has a normal mood and affect. Her behavior is normal. Judgment and thought content normal.          Assessment & Plan:

## 2013-06-01 NOTE — Progress Notes (Signed)
Pre visit review using our clinic review tool, if applicable. No additional management support is needed unless otherwise documented below in the visit note. 

## 2013-06-01 NOTE — Assessment & Plan Note (Signed)
S/p stent placement right leg. Will request records from vascular for details on stent placement. Continue Plavix.

## 2013-06-17 DIAGNOSIS — I739 Peripheral vascular disease, unspecified: Secondary | ICD-10-CM | POA: Insufficient documentation

## 2013-06-17 DIAGNOSIS — I38 Endocarditis, valve unspecified: Secondary | ICD-10-CM | POA: Insufficient documentation

## 2013-06-17 DIAGNOSIS — R0789 Other chest pain: Secondary | ICD-10-CM | POA: Insufficient documentation

## 2013-06-22 ENCOUNTER — Telehealth: Payer: Self-pay | Admitting: Internal Medicine

## 2013-06-22 DIAGNOSIS — M1990 Unspecified osteoarthritis, unspecified site: Secondary | ICD-10-CM

## 2013-06-22 MED ORDER — HYDROCODONE-ACETAMINOPHEN 5-325 MG PO TABS: 1.0000 | ORAL_TABLET | ORAL | Status: DC | PRN

## 2013-06-22 NOTE — Telephone Encounter (Signed)
Rx printed

## 2013-06-22 NOTE — Telephone Encounter (Signed)
HYDROcodone-acetaminophen (NORCO/VICODIN) 5-325 MG per

## 2013-06-22 NOTE — Telephone Encounter (Signed)
Okay to refill? 

## 2013-06-22 NOTE — Telephone Encounter (Signed)
Fine to refill 

## 2013-06-23 DIAGNOSIS — I10 Essential (primary) hypertension: Secondary | ICD-10-CM | POA: Diagnosis not present

## 2013-06-23 DIAGNOSIS — E785 Hyperlipidemia, unspecified: Secondary | ICD-10-CM | POA: Diagnosis not present

## 2013-06-23 DIAGNOSIS — I70219 Atherosclerosis of native arteries of extremities with intermittent claudication, unspecified extremity: Secondary | ICD-10-CM | POA: Diagnosis not present

## 2013-06-23 DIAGNOSIS — M79609 Pain in unspecified limb: Secondary | ICD-10-CM | POA: Diagnosis not present

## 2013-06-23 NOTE — Telephone Encounter (Signed)
Rx in folder ready for pick up  

## 2013-07-05 ENCOUNTER — Ambulatory Visit: Payer: Self-pay | Admitting: Vascular Surgery

## 2013-07-05 DIAGNOSIS — I499 Cardiac arrhythmia, unspecified: Secondary | ICD-10-CM | POA: Diagnosis not present

## 2013-07-05 DIAGNOSIS — Z86718 Personal history of other venous thrombosis and embolism: Secondary | ICD-10-CM | POA: Diagnosis not present

## 2013-07-05 DIAGNOSIS — I2510 Atherosclerotic heart disease of native coronary artery without angina pectoris: Secondary | ICD-10-CM | POA: Diagnosis not present

## 2013-07-05 DIAGNOSIS — I872 Venous insufficiency (chronic) (peripheral): Secondary | ICD-10-CM | POA: Diagnosis not present

## 2013-07-05 DIAGNOSIS — E785 Hyperlipidemia, unspecified: Secondary | ICD-10-CM | POA: Diagnosis not present

## 2013-07-05 DIAGNOSIS — I70219 Atherosclerosis of native arteries of extremities with intermittent claudication, unspecified extremity: Secondary | ICD-10-CM | POA: Diagnosis not present

## 2013-07-05 DIAGNOSIS — Z90710 Acquired absence of both cervix and uterus: Secondary | ICD-10-CM | POA: Diagnosis not present

## 2013-07-05 DIAGNOSIS — Z79899 Other long term (current) drug therapy: Secondary | ICD-10-CM | POA: Diagnosis not present

## 2013-07-05 DIAGNOSIS — I6529 Occlusion and stenosis of unspecified carotid artery: Secondary | ICD-10-CM | POA: Diagnosis not present

## 2013-07-05 LAB — CREATININE, SERUM
Creatinine: 0.85 mg/dL (ref 0.60–1.30)
EGFR (Non-African Amer.): >60

## 2013-07-05 LAB — BUN: BUN: 23 mg/dL — ABNORMAL HIGH (ref 7–18)

## 2013-07-26 ENCOUNTER — Ambulatory Visit (INDEPENDENT_AMBULATORY_CARE_PROVIDER_SITE_OTHER): Payer: Medicare Other | Admitting: *Deleted

## 2013-07-26 DIAGNOSIS — E538 Deficiency of other specified B group vitamins: Secondary | ICD-10-CM | POA: Diagnosis not present

## 2013-07-26 MED ORDER — CYANOCOBALAMIN 1000 MCG/ML IJ SOLN
1000.0000 ug | Freq: Once | INTRAMUSCULAR | Status: AC
  Administered 2013-07-26: 1000 ug via INTRAMUSCULAR

## 2013-07-27 ENCOUNTER — Telehealth: Payer: Self-pay | Admitting: *Deleted

## 2013-07-27 MED ORDER — MELOXICAM 7.5 MG PO TABS: 7.5000 mg | ORAL_TABLET | Freq: Every day | ORAL | Status: DC

## 2013-07-27 NOTE — Telephone Encounter (Signed)
Rx sent 

## 2013-07-27 NOTE — Telephone Encounter (Signed)
Fine to refill #30 with 3 refills.

## 2013-07-27 NOTE — Telephone Encounter (Signed)
Fax from pharmacy requesting Meloxicam 7.5mg  #30.  Last refill 4.15.15, last OV 5.13.15.  Please advise refill.

## 2013-08-01 DIAGNOSIS — I70219 Atherosclerosis of native arteries of extremities with intermittent claudication, unspecified extremity: Secondary | ICD-10-CM | POA: Diagnosis not present

## 2013-08-01 DIAGNOSIS — I739 Peripheral vascular disease, unspecified: Secondary | ICD-10-CM | POA: Diagnosis not present

## 2013-08-01 DIAGNOSIS — M5430 Sciatica, unspecified side: Secondary | ICD-10-CM | POA: Diagnosis not present

## 2013-08-01 DIAGNOSIS — M79609 Pain in unspecified limb: Secondary | ICD-10-CM | POA: Diagnosis not present

## 2013-08-08 DIAGNOSIS — IMO0001 Reserved for inherently not codable concepts without codable children: Secondary | ICD-10-CM | POA: Diagnosis not present

## 2013-08-08 DIAGNOSIS — M25559 Pain in unspecified hip: Secondary | ICD-10-CM | POA: Diagnosis not present

## 2013-08-08 DIAGNOSIS — S79919A Unspecified injury of unspecified hip, initial encounter: Secondary | ICD-10-CM | POA: Diagnosis not present

## 2013-08-26 ENCOUNTER — Emergency Department: Payer: Self-pay | Admitting: Emergency Medicine

## 2013-08-26 DIAGNOSIS — N281 Cyst of kidney, acquired: Secondary | ICD-10-CM | POA: Diagnosis not present

## 2013-08-26 DIAGNOSIS — Z9889 Other specified postprocedural states: Secondary | ICD-10-CM | POA: Diagnosis not present

## 2013-08-26 DIAGNOSIS — S20229A Contusion of unspecified back wall of thorax, initial encounter: Secondary | ICD-10-CM | POA: Diagnosis not present

## 2013-08-26 DIAGNOSIS — J984 Other disorders of lung: Secondary | ICD-10-CM | POA: Diagnosis not present

## 2013-08-26 DIAGNOSIS — Z7902 Long term (current) use of antithrombotics/antiplatelets: Secondary | ICD-10-CM | POA: Diagnosis not present

## 2013-08-26 DIAGNOSIS — S79919A Unspecified injury of unspecified hip, initial encounter: Secondary | ICD-10-CM | POA: Diagnosis not present

## 2013-08-26 DIAGNOSIS — S20219A Contusion of unspecified front wall of thorax, initial encounter: Secondary | ICD-10-CM | POA: Diagnosis not present

## 2013-08-26 DIAGNOSIS — Z880 Allergy status to penicillin: Secondary | ICD-10-CM | POA: Diagnosis not present

## 2013-08-26 DIAGNOSIS — M25559 Pain in unspecified hip: Secondary | ICD-10-CM | POA: Diagnosis not present

## 2013-08-26 DIAGNOSIS — R0602 Shortness of breath: Secondary | ICD-10-CM | POA: Diagnosis not present

## 2013-08-26 DIAGNOSIS — S40029A Contusion of unspecified upper arm, initial encounter: Secondary | ICD-10-CM | POA: Diagnosis not present

## 2013-08-26 DIAGNOSIS — I10 Essential (primary) hypertension: Secondary | ICD-10-CM | POA: Diagnosis not present

## 2013-08-26 DIAGNOSIS — S0990XA Unspecified injury of head, initial encounter: Secondary | ICD-10-CM | POA: Diagnosis not present

## 2013-08-26 DIAGNOSIS — R079 Chest pain, unspecified: Secondary | ICD-10-CM | POA: Diagnosis not present

## 2013-08-26 DIAGNOSIS — Z7982 Long term (current) use of aspirin: Secondary | ICD-10-CM | POA: Diagnosis not present

## 2013-08-26 LAB — COMPREHENSIVE METABOLIC PANEL
ALT: 19 U/L
ANION GAP: 5 — AB (ref 7–16)
Albumin: 3.6 g/dL (ref 3.4–5.0)
Alkaline Phosphatase: 88 U/L
BUN: 24 mg/dL — ABNORMAL HIGH (ref 7–18)
Bilirubin,Total: 0.5 mg/dL (ref 0.2–1.0)
CO2: 28 mmol/L (ref 21–32)
Calcium, Total: 9.2 mg/dL (ref 8.5–10.1)
Chloride: 103 mmol/L (ref 98–107)
Creatinine: 0.87 mg/dL (ref 0.60–1.30)
EGFR (African American): >60
Glucose: 91 mg/dL (ref 65–99)
Osmolality: 276 (ref 275–301)
Potassium: 4.3 mmol/L (ref 3.5–5.1)
SGOT(AST): 28 U/L (ref 15–37)
Sodium: 136 mmol/L (ref 136–145)
TOTAL PROTEIN: 7.3 g/dL (ref 6.4–8.2)

## 2013-08-26 LAB — CBC
HCT: 38.8 % (ref 35.0–47.0)
HGB: 12.6 g/dL (ref 12.0–16.0)
MCH: 31.6 pg (ref 26.0–34.0)
MCHC: 32.5 g/dL (ref 32.0–36.0)
MCV: 97 fL (ref 80–100)
PLATELETS: 208 10*3/uL (ref 150–440)
RBC: 3.99 10*6/uL (ref 3.80–5.20)
RDW: 12.6 % (ref 11.5–14.5)
WBC: 6.4 10*3/uL (ref 3.6–11.0)

## 2013-08-29 ENCOUNTER — Other Ambulatory Visit: Payer: Self-pay | Admitting: *Deleted

## 2013-08-29 ENCOUNTER — Telehealth: Payer: Self-pay | Admitting: Internal Medicine

## 2013-08-29 MED ORDER — LEVOTHYROXINE SODIUM 25 MCG PO TABS: ORAL_TABLET | ORAL | Status: DC

## 2013-08-29 NOTE — Telephone Encounter (Signed)
The patient was seen in the ER at [facility] on 8.7.15 for a fall . The patient was instructed to get an office visit within 2-3 days of leaving the ER . Please advise.

## 2013-08-29 NOTE — Telephone Encounter (Signed)
The patient's daughter is aware of her appointment.

## 2013-08-29 NOTE — Telephone Encounter (Signed)
Pts daughter called in and stated the date provided is "unacceptable" and that she needed to be seen sooner since it is a hospital follow up. Please Advise.

## 2013-08-29 NOTE — Telephone Encounter (Signed)
Aug 19th at 1:30 please

## 2013-08-29 NOTE — Telephone Encounter (Signed)
Please put her in on aug 12th at 9:30

## 2013-08-31 ENCOUNTER — Encounter: Payer: Self-pay | Admitting: Internal Medicine

## 2013-08-31 ENCOUNTER — Ambulatory Visit (INDEPENDENT_AMBULATORY_CARE_PROVIDER_SITE_OTHER): Payer: Medicare Other | Admitting: Internal Medicine

## 2013-08-31 VITALS — BP 132/60 | HR 73 | Temp 97.6°F | Ht 60.8 in | Wt 121.5 lb

## 2013-08-31 DIAGNOSIS — R071 Chest pain on breathing: Secondary | ICD-10-CM

## 2013-08-31 DIAGNOSIS — D510 Vitamin B12 deficiency anemia due to intrinsic factor deficiency: Secondary | ICD-10-CM | POA: Diagnosis not present

## 2013-08-31 DIAGNOSIS — T148XXA Other injury of unspecified body region, initial encounter: Secondary | ICD-10-CM | POA: Diagnosis not present

## 2013-08-31 DIAGNOSIS — I70209 Unspecified atherosclerosis of native arteries of extremities, unspecified extremity: Secondary | ICD-10-CM | POA: Diagnosis not present

## 2013-08-31 DIAGNOSIS — M1990 Unspecified osteoarthritis, unspecified site: Secondary | ICD-10-CM | POA: Diagnosis not present

## 2013-08-31 DIAGNOSIS — R0789 Other chest pain: Secondary | ICD-10-CM | POA: Insufficient documentation

## 2013-08-31 LAB — CBC WITH DIFFERENTIAL/PLATELET
BASOS ABS: 0 10*3/uL (ref 0.0–0.1)
Basophils Relative: 0.2 % (ref 0.0–3.0)
Eosinophils Absolute: 0 10*3/uL (ref 0.0–0.7)
Eosinophils Relative: 0.6 % (ref 0.0–5.0)
HCT: 37.4 % (ref 36.0–46.0)
Hemoglobin: 12.5 g/dL (ref 12.0–15.0)
LYMPHS ABS: 1.4 10*3/uL (ref 0.7–4.0)
Lymphocytes Relative: 24.5 % (ref 12.0–46.0)
MCHC: 33.5 g/dL (ref 30.0–36.0)
MCV: 96 fl (ref 78.0–100.0)
MONO ABS: 0.5 10*3/uL (ref 0.1–1.0)
MONOS PCT: 8.2 % (ref 3.0–12.0)
Neutro Abs: 3.9 10*3/uL (ref 1.4–7.7)
Neutrophils Relative %: 66.5 % (ref 43.0–77.0)
PLATELETS: 218 10*3/uL (ref 150.0–400.0)
RBC: 3.9 Mil/uL (ref 3.87–5.11)
RDW: 12.8 % (ref 11.5–15.5)
WBC: 5.8 10*3/uL (ref 4.0–10.5)

## 2013-08-31 LAB — COMPREHENSIVE METABOLIC PANEL
ALT: 11 U/L (ref 0–35)
AST: 22 U/L (ref 0–37)
Albumin: 3.9 g/dL (ref 3.5–5.2)
Alkaline Phosphatase: 66 U/L (ref 39–117)
BUN: 25 mg/dL — ABNORMAL HIGH (ref 6–23)
CO2: 25 meq/L (ref 19–32)
Calcium: 9.9 mg/dL (ref 8.4–10.5)
Chloride: 104 mEq/L (ref 96–112)
Creatinine, Ser: 1 mg/dL (ref 0.4–1.2)
GFR: 59.97 mL/min — AB (ref >60.00)
GLUCOSE: 111 mg/dL — AB (ref 70–99)
POTASSIUM: 5.2 meq/L — AB (ref 3.5–5.1)
Sodium: 134 mEq/L — ABNORMAL LOW (ref 135–145)
TOTAL PROTEIN: 6.6 g/dL (ref 6.0–8.3)
Total Bilirubin: 0.6 mg/dL (ref 0.2–1.2)

## 2013-08-31 LAB — VITAMIN B12: VITAMIN B 12: 459 pg/mL (ref 211–911)

## 2013-08-31 MED ORDER — MELOXICAM 7.5 MG PO TABS: 7.5000 mg | ORAL_TABLET | Freq: Every day | ORAL | Status: DC

## 2013-08-31 MED ORDER — CYANOCOBALAMIN 1000 MCG/ML IJ SOLN
1000.0000 ug | Freq: Once | INTRAMUSCULAR | Status: AC
  Administered 2013-08-31: 1000 ug via INTRAMUSCULAR

## 2013-08-31 MED ORDER — HYDROCODONE-ACETAMINOPHEN 5-325 MG PO TABS: 1.0000 | ORAL_TABLET | ORAL | Status: DC | PRN

## 2013-08-31 NOTE — Progress Notes (Signed)
Pre visit review using our clinic review tool, if applicable. No additional management support is needed unless otherwise documented below in the visit note. 

## 2013-08-31 NOTE — Progress Notes (Signed)
Subjective:    Patient ID: Krista Ellis, female    DOB: January 20, 1931, 80 y.o.   MRN: [id number]  HPI 80YO female presents for hospital follow up.  Seen in the [facility] ED on 8/7 for a fall. Fell on tile floor while walking into bathroom. Hit head on edge of marble countertop. No LOC. CT head normal. Right hip xray normal. CT of chest and abdomen were normal. Concerned about rib fracture. Some persistent tenderness over right lateral chest wall at area of hematoma. No dyspnea. Taking Hydrocodone prn for pain with improvement.  Review of Systems  Constitutional: Positive for fatigue. Negative for fever, chills, appetite change and unexpected weight change.  Eyes: Negative for visual disturbance.  Respiratory: Negative for shortness of breath.   Cardiovascular: Positive for chest pain. Negative for leg swelling.  Gastrointestinal: Negative for abdominal pain.  Skin: Positive for color change and wound. Negative for rash.  Hematological: Negative for adenopathy. Does not bruise/bleed easily.  Psychiatric/Behavioral: Negative for dysphoric mood. The patient is not nervous/anxious.        Objective:    BP 132/60  Pulse 73  Temp(Src) 97.6 F (36.4 C) (Oral)  Ht 5' 0.8" (1.544 m)  Wt 121 lb 8 oz (55.112 kg)  BMI 23.12 kg/m2  SpO2 97% Physical Exam  Constitutional: She is oriented to person, place, and time. She appears well-developed and well-nourished. No distress.  HENT:  Head: Normocephalic and atraumatic.  Right Ear: External ear normal.  Left Ear: External ear normal.  Nose: Nose normal.  Mouth/Throat: Oropharynx is clear and moist. No oropharyngeal exudate.  Eyes: Conjunctivae are normal. Pupils are equal, round, and reactive to light. Right eye exhibits no discharge. Left eye exhibits no discharge. No scleral icterus.  Neck: Normal range of motion. Neck supple. No tracheal deviation present. No thyromegaly present.  Cardiovascular: Normal rate, regular rhythm, normal heart  sounds and intact distal pulses.  Exam reveals no gallop and no friction rub.   No murmur heard. Pulmonary/Chest: Effort normal and breath sounds normal. No accessory muscle usage. Not tachypneic. No respiratory distress. She has no decreased breath sounds. She has no wheezes. She has no rhonchi. She has no rales. She exhibits no tenderness.  Musculoskeletal: Normal range of motion. She exhibits no edema and no tenderness.  Lymphadenopathy:    She has no cervical adenopathy.  Neurological: She is alert and oriented to person, place, and time. No cranial nerve deficit. She exhibits normal muscle tone. Coordination normal.  Skin: Skin is warm and dry. Ecchymosis noted. No rash noted. She is not diaphoretic. No erythema. No pallor.     Psychiatric: She has a normal mood and affect. Her behavior is normal. Judgment and thought content normal.          Assessment & Plan:   Problem List Items Addressed This Visit     Unprioritized   Chest wall pain - Primary     Chest wall pain at site of fall and hematoma. Will request records from [facility] ED imaging. Exam normal today except for ecchymoses and mild tenderness at site. Will continue prn Hydrocodone for severe pain only.    Relevant Orders      Comp Met (CMET) (Completed)   Hematoma and contusion     Large hematoma after fall. CBC shows slight drop in Hgb from 14 to12, but no significant anemia. No signs of continued bleeding and reported head CT normal. Will request notes from [facility]. Follow up in 4 weeks and  prn. Discussed falls prevention at home including continued use of Tykeria Wawrzyniak, bedside commode, and lifealert.    Relevant Orders      CBC with Differential (Completed)   Osteoarthritis   Relevant Medications      HYDROcodone-acetaminophen (NORCO/VICODIN) 5-325 MG per tablet      meloxicam (MOBIC) tablet    Other Visit Diagnoses   Pernicious anemia        Relevant Medications       cyanocobalamin ((VITAMIN B-12)) injection 1,000 mcg  (Completed)    Other Relevant Orders       B12 (Completed)        Return in about 4 weeks (around 09/28/2013) for Recheck.

## 2013-08-31 NOTE — Assessment & Plan Note (Signed)
Chest wall pain at site of fall and hematoma. Will request records from [facility] ED imaging. Exam normal today except for ecchymoses and mild tenderness at site. Will continue prn Hydrocodone for severe pain only.

## 2013-08-31 NOTE — Assessment & Plan Note (Signed)
Large hematoma after fall. CBC shows slight drop in Hgb from 14 to12, but no significant anemia. No signs of continued bleeding and reported head CT normal. Will request notes from [facility]. Follow up in 4 weeks and prn. Discussed falls prevention at home including continued use of Ambriella Kitt, bedside commode, and lifealert.

## 2013-08-31 NOTE — Patient Instructions (Signed)
Labs today.  Follow up in 4 weeks. 

## 2013-09-01 ENCOUNTER — Encounter: Payer: Self-pay | Admitting: Internal Medicine

## 2013-09-01 DIAGNOSIS — R9389 Abnormal findings on diagnostic imaging of other specified body structures: Secondary | ICD-10-CM | POA: Insufficient documentation

## 2013-09-07 ENCOUNTER — Ambulatory Visit: Payer: Medicare Other | Admitting: Internal Medicine

## 2013-09-20 DIAGNOSIS — I739 Peripheral vascular disease, unspecified: Secondary | ICD-10-CM | POA: Diagnosis not present

## 2013-09-20 DIAGNOSIS — I38 Endocarditis, valve unspecified: Secondary | ICD-10-CM | POA: Diagnosis not present

## 2013-09-20 DIAGNOSIS — I10 Essential (primary) hypertension: Secondary | ICD-10-CM | POA: Diagnosis not present

## 2013-09-20 DIAGNOSIS — R0789 Other chest pain: Secondary | ICD-10-CM | POA: Diagnosis not present

## 2013-09-25 ENCOUNTER — Inpatient Hospital Stay: Payer: Self-pay | Admitting: Internal Medicine

## 2013-09-25 DIAGNOSIS — R0789 Other chest pain: Secondary | ICD-10-CM | POA: Diagnosis not present

## 2013-09-25 DIAGNOSIS — M1990 Unspecified osteoarthritis, unspecified site: Secondary | ICD-10-CM | POA: Diagnosis not present

## 2013-09-25 DIAGNOSIS — K219 Gastro-esophageal reflux disease without esophagitis: Secondary | ICD-10-CM | POA: Diagnosis present

## 2013-09-25 DIAGNOSIS — E039 Hypothyroidism, unspecified: Secondary | ICD-10-CM | POA: Diagnosis present

## 2013-09-25 DIAGNOSIS — I248 Other forms of acute ischemic heart disease: Secondary | ICD-10-CM | POA: Diagnosis not present

## 2013-09-25 DIAGNOSIS — S2239XA Fracture of one rib, unspecified side, initial encounter for closed fracture: Secondary | ICD-10-CM | POA: Diagnosis not present

## 2013-09-25 DIAGNOSIS — N390 Urinary tract infection, site not specified: Secondary | ICD-10-CM | POA: Diagnosis not present

## 2013-09-25 DIAGNOSIS — K5730 Diverticulosis of large intestine without perforation or abscess without bleeding: Secondary | ICD-10-CM | POA: Diagnosis present

## 2013-09-25 DIAGNOSIS — R7989 Other specified abnormal findings of blood chemistry: Secondary | ICD-10-CM | POA: Diagnosis not present

## 2013-09-25 DIAGNOSIS — R5381 Other malaise: Secondary | ICD-10-CM | POA: Diagnosis not present

## 2013-09-25 DIAGNOSIS — E785 Hyperlipidemia, unspecified: Secondary | ICD-10-CM | POA: Diagnosis present

## 2013-09-25 DIAGNOSIS — Z7982 Long term (current) use of aspirin: Secondary | ICD-10-CM | POA: Diagnosis not present

## 2013-09-25 DIAGNOSIS — Z23 Encounter for immunization: Secondary | ICD-10-CM | POA: Diagnosis not present

## 2013-09-25 DIAGNOSIS — Z9181 History of falling: Secondary | ICD-10-CM | POA: Diagnosis not present

## 2013-09-25 DIAGNOSIS — S0993XA Unspecified injury of face, initial encounter: Secondary | ICD-10-CM | POA: Diagnosis not present

## 2013-09-25 DIAGNOSIS — M549 Dorsalgia, unspecified: Secondary | ICD-10-CM | POA: Diagnosis not present

## 2013-09-25 DIAGNOSIS — R079 Chest pain, unspecified: Secondary | ICD-10-CM | POA: Diagnosis not present

## 2013-09-25 DIAGNOSIS — R071 Chest pain on breathing: Secondary | ICD-10-CM | POA: Diagnosis not present

## 2013-09-25 DIAGNOSIS — S20219A Contusion of unspecified front wall of thorax, initial encounter: Secondary | ICD-10-CM | POA: Diagnosis not present

## 2013-09-25 DIAGNOSIS — I10 Essential (primary) hypertension: Secondary | ICD-10-CM | POA: Diagnosis not present

## 2013-09-25 DIAGNOSIS — Z5189 Encounter for other specified aftercare: Secondary | ICD-10-CM | POA: Diagnosis not present

## 2013-09-25 DIAGNOSIS — R269 Unspecified abnormalities of gait and mobility: Secondary | ICD-10-CM | POA: Diagnosis not present

## 2013-09-25 DIAGNOSIS — S0990XA Unspecified injury of head, initial encounter: Secondary | ICD-10-CM | POA: Diagnosis not present

## 2013-09-25 DIAGNOSIS — I739 Peripheral vascular disease, unspecified: Secondary | ICD-10-CM | POA: Diagnosis not present

## 2013-09-25 DIAGNOSIS — IMO0001 Reserved for inherently not codable concepts without codable children: Secondary | ICD-10-CM | POA: Diagnosis not present

## 2013-09-25 DIAGNOSIS — M6281 Muscle weakness (generalized): Secondary | ICD-10-CM | POA: Diagnosis not present

## 2013-09-25 DIAGNOSIS — J309 Allergic rhinitis, unspecified: Secondary | ICD-10-CM | POA: Diagnosis present

## 2013-09-25 DIAGNOSIS — R5383 Other fatigue: Secondary | ICD-10-CM | POA: Diagnosis present

## 2013-09-25 DIAGNOSIS — A498 Other bacterial infections of unspecified site: Secondary | ICD-10-CM | POA: Diagnosis not present

## 2013-09-25 DIAGNOSIS — Z7902 Long term (current) use of antithrombotics/antiplatelets: Secondary | ICD-10-CM | POA: Diagnosis not present

## 2013-09-25 DIAGNOSIS — S2249XA Multiple fractures of ribs, unspecified side, initial encounter for closed fracture: Secondary | ICD-10-CM | POA: Diagnosis not present

## 2013-09-25 DIAGNOSIS — S199XXA Unspecified injury of neck, initial encounter: Secondary | ICD-10-CM | POA: Diagnosis not present

## 2013-09-25 DIAGNOSIS — S298XXA Other specified injuries of thorax, initial encounter: Secondary | ICD-10-CM | POA: Diagnosis not present

## 2013-09-25 LAB — HEPATIC FUNCTION PANEL A (ARMC)
ALBUMIN: 3.5 g/dL (ref 3.4–5.0)
ALK PHOS: 60 U/L
ALT: 14 U/L
BILIRUBIN DIRECT: 0.2 mg/dL (ref 0.00–0.20)
BILIRUBIN TOTAL: 0.4 mg/dL (ref 0.2–1.0)
SGOT(AST): 25 U/L (ref 15–37)
Total Protein: 6.7 g/dL (ref 6.4–8.2)

## 2013-09-25 LAB — URINALYSIS, COMPLETE
Bilirubin,UR: NEGATIVE
GLUCOSE, UR: NEGATIVE mg/dL (ref 0–75)
Ketone: NEGATIVE
Nitrite: POSITIVE
PH: 6 (ref 4.5–8.0)
Protein: NEGATIVE
SPECIFIC GRAVITY: 1.01 (ref 1.003–1.030)

## 2013-09-25 LAB — BASIC METABOLIC PANEL WITH GFR
Anion Gap: 6 — ABNORMAL LOW
BUN: 26 mg/dL — ABNORMAL HIGH
Calcium, Total: 9.3 mg/dL
Chloride: 106 mmol/L
Co2: 27 mmol/L
Creatinine: 0.78 mg/dL
EGFR (African American): >60
EGFR (Non-African Amer.): >60
Glucose: 97 mg/dL
Osmolality: 282
Potassium: 4.5 mmol/L
Sodium: 139 mmol/L

## 2013-09-25 LAB — CK TOTAL AND CKMB (NOT AT ARMC)
CK, TOTAL: 80 U/L
CK-MB: 1.8 ng/mL (ref 0.5–3.6)

## 2013-09-25 LAB — CBC
HCT: 38.5 % (ref 35.0–47.0)
HGB: 12.5 g/dL (ref 12.0–16.0)
MCH: 31.9 pg (ref 26.0–34.0)
MCHC: 32.4 g/dL (ref 32.0–36.0)
MCV: 99 fL (ref 80–100)
Platelet: 166 10*3/uL (ref 150–440)
RBC: 3.9 10*6/uL (ref 3.80–5.20)
RDW: 13.2 % (ref 11.5–14.5)
WBC: 5.7 10*3/uL (ref 3.6–11.0)

## 2013-09-25 LAB — CK-MB: CK-MB: 2.1 ng/mL (ref 0.5–3.6)

## 2013-09-25 LAB — LIPASE, BLOOD: Lipase: 146 U/L (ref 73–393)

## 2013-09-25 LAB — TROPONIN I
TROPONIN-I: 0.15 ng/mL — AB
Troponin-I: 0.16 ng/mL — ABNORMAL HIGH
Troponin-I: 0.18 ng/mL — ABNORMAL HIGH

## 2013-09-26 LAB — BASIC METABOLIC PANEL
ANION GAP: 9 (ref 7–16)
BUN: 17 mg/dL (ref 7–18)
CHLORIDE: 109 mmol/L — AB (ref 98–107)
Calcium, Total: 8.8 mg/dL (ref 8.5–10.1)
Co2: 25 mmol/L (ref 21–32)
Creatinine: 0.69 mg/dL (ref 0.60–1.30)
EGFR (African American): >60
EGFR (Non-African Amer.): >60
Glucose: 88 mg/dL (ref 65–99)
Osmolality: 286 (ref 275–301)
Potassium: 4.4 mmol/L (ref 3.5–5.1)
Sodium: 143 mmol/L (ref 136–145)

## 2013-09-26 LAB — CBC WITH DIFFERENTIAL/PLATELET
Basophil #: 0 10*3/uL (ref 0.0–0.1)
Basophil %: 0.4 %
Eosinophil #: 0.1 10*3/uL (ref 0.0–0.7)
Eosinophil %: 3.5 %
HCT: 34.3 % — AB (ref 35.0–47.0)
HGB: 11.6 g/dL — AB (ref 12.0–16.0)
Lymphocyte #: 1.5 10*3/uL (ref 1.0–3.6)
Lymphocyte %: 40.6 %
MCH: 32.6 pg (ref 26.0–34.0)
MCHC: 33.8 g/dL (ref 32.0–36.0)
MCV: 97 fL (ref 80–100)
MONO ABS: 0.5 x10 3/mm (ref 0.2–0.9)
MONOS PCT: 13.5 %
Neutrophil #: 1.6 10*3/uL (ref 1.4–6.5)
Neutrophil %: 42 %
PLATELETS: 158 10*3/uL (ref 150–440)
RBC: 3.56 10*6/uL — AB (ref 3.80–5.20)
RDW: 13.2 % (ref 11.5–14.5)
WBC: 3.8 10*3/uL (ref 3.6–11.0)

## 2013-09-26 LAB — CK TOTAL AND CKMB (NOT AT ARMC)
CK, Total: 69 U/L
CK-MB: 1.8 ng/mL (ref 0.5–3.6)

## 2013-09-27 LAB — URINE CULTURE

## 2013-09-28 DIAGNOSIS — S2239XA Fracture of one rib, unspecified side, initial encounter for closed fracture: Secondary | ICD-10-CM | POA: Diagnosis not present

## 2013-09-28 DIAGNOSIS — R269 Unspecified abnormalities of gait and mobility: Secondary | ICD-10-CM | POA: Diagnosis not present

## 2013-09-28 DIAGNOSIS — E785 Hyperlipidemia, unspecified: Secondary | ICD-10-CM | POA: Diagnosis not present

## 2013-09-28 DIAGNOSIS — I739 Peripheral vascular disease, unspecified: Secondary | ICD-10-CM | POA: Diagnosis not present

## 2013-09-28 DIAGNOSIS — E039 Hypothyroidism, unspecified: Secondary | ICD-10-CM | POA: Diagnosis not present

## 2013-09-28 DIAGNOSIS — R5381 Other malaise: Secondary | ICD-10-CM | POA: Diagnosis not present

## 2013-09-28 DIAGNOSIS — IMO0001 Reserved for inherently not codable concepts without codable children: Secondary | ICD-10-CM | POA: Diagnosis not present

## 2013-09-28 DIAGNOSIS — S2241XD Multiple fractures of ribs, right side, subsequent encounter for fracture with routine healing: Secondary | ICD-10-CM | POA: Diagnosis not present

## 2013-09-28 DIAGNOSIS — Z5189 Encounter for other specified aftercare: Secondary | ICD-10-CM | POA: Diagnosis not present

## 2013-09-28 DIAGNOSIS — R7989 Other specified abnormal findings of blood chemistry: Secondary | ICD-10-CM | POA: Diagnosis not present

## 2013-09-28 DIAGNOSIS — M6281 Muscle weakness (generalized): Secondary | ICD-10-CM | POA: Diagnosis not present

## 2013-09-28 DIAGNOSIS — A498 Other bacterial infections of unspecified site: Secondary | ICD-10-CM | POA: Diagnosis not present

## 2013-09-28 DIAGNOSIS — K219 Gastro-esophageal reflux disease without esophagitis: Secondary | ICD-10-CM | POA: Diagnosis not present

## 2013-09-28 DIAGNOSIS — Z23 Encounter for immunization: Secondary | ICD-10-CM | POA: Diagnosis not present

## 2013-09-28 DIAGNOSIS — R5383 Other fatigue: Secondary | ICD-10-CM | POA: Diagnosis not present

## 2013-09-28 DIAGNOSIS — M1990 Unspecified osteoarthritis, unspecified site: Secondary | ICD-10-CM | POA: Diagnosis not present

## 2013-09-28 DIAGNOSIS — I248 Other forms of acute ischemic heart disease: Secondary | ICD-10-CM | POA: Diagnosis not present

## 2013-09-28 DIAGNOSIS — R2689 Other abnormalities of gait and mobility: Secondary | ICD-10-CM | POA: Diagnosis not present

## 2013-09-28 DIAGNOSIS — I10 Essential (primary) hypertension: Secondary | ICD-10-CM | POA: Diagnosis not present

## 2013-09-28 DIAGNOSIS — N390 Urinary tract infection, site not specified: Secondary | ICD-10-CM | POA: Diagnosis not present

## 2013-09-28 DIAGNOSIS — Z9181 History of falling: Secondary | ICD-10-CM | POA: Diagnosis not present

## 2013-09-28 DIAGNOSIS — S2249XA Multiple fractures of ribs, unspecified side, initial encounter for closed fracture: Secondary | ICD-10-CM | POA: Diagnosis not present

## 2013-09-29 DIAGNOSIS — N390 Urinary tract infection, site not specified: Secondary | ICD-10-CM | POA: Diagnosis not present

## 2013-09-29 DIAGNOSIS — R5381 Other malaise: Secondary | ICD-10-CM | POA: Diagnosis not present

## 2013-09-30 ENCOUNTER — Encounter: Payer: Self-pay | Admitting: Internal Medicine

## 2013-10-06 ENCOUNTER — Ambulatory Visit: Payer: Self-pay | Admitting: Internal Medicine

## 2013-10-10 LAB — URINALYSIS, COMPLETE
Bilirubin,UR: NEGATIVE
Glucose,UR: NEGATIVE mg/dL (ref 0–75)
Ketone: NEGATIVE
NITRITE: NEGATIVE
Ph: 5 (ref 4.5–8.0)
Protein: 25
SPECIFIC GRAVITY: 1.015 (ref 1.003–1.030)
WBC UR: 591 /HPF (ref 0–5)

## 2013-10-11 ENCOUNTER — Ambulatory Visit: Payer: Medicare Other | Admitting: Internal Medicine

## 2013-10-11 DIAGNOSIS — Z0289 Encounter for other administrative examinations: Secondary | ICD-10-CM

## 2013-10-11 LAB — URINE CULTURE

## 2013-10-20 ENCOUNTER — Encounter: Payer: Self-pay | Admitting: Internal Medicine

## 2013-11-03 ENCOUNTER — Telehealth: Payer: Self-pay

## 2013-11-03 NOTE — Telephone Encounter (Signed)
Krista Ellis, Can you set her up for 59min visit? We will need to see her to set up home health.

## 2013-11-03 NOTE — Telephone Encounter (Signed)
The patient's daughter called and stated the patient would be discharged from a skilled nursing facility on the 22nd.  She is hoping home health care can be set up for her after this time.  The daughter is also hoping she can be worked in for an appointment to be accessed.  Please advise.   DaughterSonia Side [phone]

## 2013-11-04 NOTE — Telephone Encounter (Signed)
Per dr walker please put pt on the schedule for Oct 30th at 1:00, I already verified appt with pt's daughter. Thank you

## 2013-11-12 DIAGNOSIS — I10 Essential (primary) hypertension: Secondary | ICD-10-CM | POA: Diagnosis not present

## 2013-11-12 DIAGNOSIS — Z87440 Personal history of urinary (tract) infections: Secondary | ICD-10-CM | POA: Diagnosis not present

## 2013-11-12 DIAGNOSIS — M159 Polyosteoarthritis, unspecified: Secondary | ICD-10-CM | POA: Diagnosis not present

## 2013-11-12 DIAGNOSIS — Z9181 History of falling: Secondary | ICD-10-CM | POA: Diagnosis not present

## 2013-11-12 DIAGNOSIS — S2241XD Multiple fractures of ribs, right side, subsequent encounter for fracture with routine healing: Secondary | ICD-10-CM | POA: Diagnosis not present

## 2013-11-12 DIAGNOSIS — E039 Hypothyroidism, unspecified: Secondary | ICD-10-CM | POA: Diagnosis not present

## 2013-11-15 DIAGNOSIS — E039 Hypothyroidism, unspecified: Secondary | ICD-10-CM | POA: Diagnosis not present

## 2013-11-15 DIAGNOSIS — S2241XD Multiple fractures of ribs, right side, subsequent encounter for fracture with routine healing: Secondary | ICD-10-CM | POA: Diagnosis not present

## 2013-11-15 DIAGNOSIS — Z9181 History of falling: Secondary | ICD-10-CM | POA: Diagnosis not present

## 2013-11-15 DIAGNOSIS — M159 Polyosteoarthritis, unspecified: Secondary | ICD-10-CM | POA: Diagnosis not present

## 2013-11-15 DIAGNOSIS — I10 Essential (primary) hypertension: Secondary | ICD-10-CM | POA: Diagnosis not present

## 2013-11-15 DIAGNOSIS — Z87440 Personal history of urinary (tract) infections: Secondary | ICD-10-CM | POA: Diagnosis not present

## 2013-11-16 ENCOUNTER — Telehealth: Payer: Self-pay | Admitting: Internal Medicine

## 2013-11-16 DIAGNOSIS — M19012 Primary osteoarthritis, left shoulder: Secondary | ICD-10-CM | POA: Diagnosis not present

## 2013-11-16 NOTE — Telephone Encounter (Signed)
Patient Information:  Caller Name: [name]  Phone: [phone]  Patient: Krista Ellis, Krista Ellis  Gender: Female  DOB: 1931-01-14  Age: 80 Years  PCP: Ronette Deter (Adults only)  Office Follow Up:  Does the office need to follow up with this patient?: No  Instructions For The Office: N/A   Symptoms  Reason For Call & Symptoms: Daughter calling regarding urinary symptoms.  She insists that it is UTI. Pain with urination onset 11/15/13. Caller is not with the patient; unable to get patient or anyone who is with her on the phone.  Advised to see provider within 4 hours per nursing judgment since unable to contact patient with multiple attempts.  Caller voiced understanding.  She will contact her mother and advise her to be seen either at office if possible or at Urgent Care due to her age.  Reviewed Health History In EMR: Yes  Reviewed Medications In EMR: Yes  Reviewed Allergies In EMR: Yes  Reviewed Surgeries / Procedures: Yes  Date of Onset of Symptoms: 11/15/2013  Guideline(s) Used:  Urination Pain - Female  No Protocol Available - Sick Adult  Disposition Per Guideline:   See Today in Office  Reason For Disposition Reached:   Nursing judgment  Advice Given:  Call Back If:  You become worse.  RN Overrode Recommendation:  Document Patient  Caller to check with patient to possibly seek appointment at office or urgent care.

## 2013-11-17 ENCOUNTER — Telehealth: Payer: Self-pay | Admitting: Internal Medicine

## 2013-11-17 DIAGNOSIS — E039 Hypothyroidism, unspecified: Secondary | ICD-10-CM | POA: Diagnosis not present

## 2013-11-17 DIAGNOSIS — S2241XD Multiple fractures of ribs, right side, subsequent encounter for fracture with routine healing: Secondary | ICD-10-CM | POA: Diagnosis not present

## 2013-11-17 DIAGNOSIS — I10 Essential (primary) hypertension: Secondary | ICD-10-CM | POA: Diagnosis not present

## 2013-11-17 DIAGNOSIS — Z9181 History of falling: Secondary | ICD-10-CM | POA: Diagnosis not present

## 2013-11-17 DIAGNOSIS — M159 Polyosteoarthritis, unspecified: Secondary | ICD-10-CM | POA: Diagnosis not present

## 2013-11-17 DIAGNOSIS — Z87440 Personal history of urinary (tract) infections: Secondary | ICD-10-CM | POA: Diagnosis not present

## 2013-11-17 NOTE — Telephone Encounter (Signed)
Agree that she should be seen. May have already been seen at urgent care yesterday.

## 2013-11-17 NOTE — Telephone Encounter (Signed)
The patient will come to her appointment on 10.30.15 . Daughter Blima Dessert asked to just forget about try to find her a sooner appointment for today.

## 2013-11-18 ENCOUNTER — Telehealth: Payer: Self-pay | Admitting: Internal Medicine

## 2013-11-18 ENCOUNTER — Encounter: Payer: Self-pay | Admitting: Internal Medicine

## 2013-11-18 ENCOUNTER — Ambulatory Visit (INDEPENDENT_AMBULATORY_CARE_PROVIDER_SITE_OTHER): Payer: Medicare Other | Admitting: Internal Medicine

## 2013-11-18 ENCOUNTER — Ambulatory Visit: Payer: Self-pay | Admitting: Internal Medicine

## 2013-11-18 VITALS — BP 106/58 | HR 64 | Temp 97.5°F | Resp 14 | Wt 116.0 lb

## 2013-11-18 DIAGNOSIS — Z96641 Presence of right artificial hip joint: Secondary | ICD-10-CM | POA: Diagnosis not present

## 2013-11-18 DIAGNOSIS — N200 Calculus of kidney: Secondary | ICD-10-CM | POA: Diagnosis not present

## 2013-11-18 DIAGNOSIS — R109 Unspecified abdominal pain: Secondary | ICD-10-CM | POA: Diagnosis not present

## 2013-11-18 DIAGNOSIS — N3001 Acute cystitis with hematuria: Secondary | ICD-10-CM

## 2013-11-18 DIAGNOSIS — Z90710 Acquired absence of both cervix and uterus: Secondary | ICD-10-CM | POA: Diagnosis not present

## 2013-11-18 DIAGNOSIS — R296 Repeated falls: Secondary | ICD-10-CM

## 2013-11-18 DIAGNOSIS — K5790 Diverticulosis of intestine, part unspecified, without perforation or abscess without bleeding: Secondary | ICD-10-CM | POA: Diagnosis not present

## 2013-11-18 DIAGNOSIS — Z79899 Other long term (current) drug therapy: Secondary | ICD-10-CM

## 2013-11-18 DIAGNOSIS — K8020 Calculus of gallbladder without cholecystitis without obstruction: Secondary | ICD-10-CM | POA: Diagnosis not present

## 2013-11-18 DIAGNOSIS — K8080 Other cholelithiasis without obstruction: Secondary | ICD-10-CM | POA: Diagnosis not present

## 2013-11-18 DIAGNOSIS — I70209 Unspecified atherosclerosis of native arteries of extremities, unspecified extremity: Secondary | ICD-10-CM

## 2013-11-18 DIAGNOSIS — M4316 Spondylolisthesis, lumbar region: Secondary | ICD-10-CM | POA: Diagnosis not present

## 2013-11-18 DIAGNOSIS — E538 Deficiency of other specified B group vitamins: Secondary | ICD-10-CM

## 2013-11-18 DIAGNOSIS — R911 Solitary pulmonary nodule: Secondary | ICD-10-CM | POA: Diagnosis not present

## 2013-11-18 MED ORDER — CEFUROXIME AXETIL 250 MG PO TABS: 250.0000 mg | ORAL_TABLET | Freq: Two times a day (BID) | ORAL | Status: DC

## 2013-11-18 MED ORDER — CYANOCOBALAMIN 1000 MCG/ML IJ SOLN
1000.0000 ug | Freq: Once | INTRAMUSCULAR | Status: AC
  Administered 2013-11-18: 1000 ug via INTRAMUSCULAR

## 2013-11-18 NOTE — Patient Instructions (Signed)
We will set up a CT of the abdomen to further evaluate your left abdominal and flank pain.  We will send a urine sample for culture today.  Start Cefuroxime 250mg  twice daily.  We have also placed a referral for home health assistance.

## 2013-11-18 NOTE — Addendum Note (Signed)
Addended by: Vernetta Honey on: 11/18/2013 01:59 PM   Modules accepted: Orders

## 2013-11-18 NOTE — Progress Notes (Signed)
Subjective:    Patient ID: Krista Ellis, female    DOB: 05-18-1930, 80 y.o.   MRN: [id number]  HPI 80YO female presents for follow up.  Fell in September, broke 3 ribs. Was admitted at [facility], then transferred to [facility] for rehab for 42 days. Now back at home. Having some pain in lower pelvis. Was told by Dr. Jacqlyn Larsen she had a kidney stone. Also notes burning with urination. Feels like previous kidney infections to her. No fever, chills. Not taking anything for symptoms.  Review of Systems  Constitutional: Negative for fever, chills, appetite change, fatigue and unexpected weight change.  Eyes: Negative for visual disturbance.  Respiratory: Negative for shortness of breath.   Cardiovascular: Negative for chest pain and leg swelling.  Gastrointestinal: Positive for abdominal pain (left lower abdomen and flank). Negative for nausea, vomiting, diarrhea, constipation and rectal pain.  Genitourinary: Positive for dysuria, urgency and frequency.  Musculoskeletal: Positive for arthralgias, back pain and myalgias.  Skin: Negative for color change and rash.  Hematological: Negative for adenopathy. Does not bruise/bleed easily.  Psychiatric/Behavioral: Negative for dysphoric mood. The patient is not nervous/anxious.        Objective:    BP 106/58  Pulse 64  Temp(Src) 97.5 F (36.4 C) (Oral)  Resp 14  Wt 116 lb (52.617 kg)  SpO2 98% Physical Exam  Constitutional: She is oriented to person, place, and time. She appears well-developed and well-nourished. No distress.  HENT:  Head: Normocephalic and atraumatic.  Right Ear: External ear normal.  Left Ear: External ear normal.  Nose: Nose normal.  Mouth/Throat: Oropharynx is clear and moist.  Eyes: Conjunctivae are normal. Pupils are equal, round, and reactive to light. Right eye exhibits no discharge. Left eye exhibits no discharge. No scleral icterus.  Neck: Normal range of motion. Neck supple. No tracheal deviation present. No  thyromegaly present.  Cardiovascular: Normal rate, regular rhythm, normal heart sounds and intact distal pulses.  Exam reveals no gallop and no friction rub.   No murmur heard. Pulmonary/Chest: Effort normal and breath sounds normal. No accessory muscle usage. Not tachypneic. No respiratory distress. She has no decreased breath sounds. She has no wheezes. She has no rhonchi. She has no rales. She exhibits no tenderness.  Abdominal: Soft. Bowel sounds are normal. She exhibits no distension and no mass. There is tenderness (left lower abdomen and flank). There is no rebound and no guarding.  Musculoskeletal: Normal range of motion. She exhibits no edema and no tenderness.  Lymphadenopathy:    She has no cervical adenopathy.  Neurological: She is alert and oriented to person, place, and time. No cranial nerve deficit. She exhibits normal muscle tone. Coordination (uses rolling walker for stability) abnormal.  Skin: Skin is warm and dry. No rash noted. She is not diaphoretic. No erythema. No pallor.  Psychiatric: She has a normal mood and affect. Her behavior is normal. Judgment and thought content normal.          Assessment & Plan:   Problem List Items Addressed This Visit     Unprioritized   Acute cystitis with hematuria - Primary     Symptoms consistent with UTI. Will send urine for culture and start Cefuroxime. Will get CT abdomen to further evaluate left flank pain given previous h/o kidney stone.    Relevant Medications      cefUROXime (CEFTIN) tablet   Other Relevant Orders      POCT Urinalysis Dipstick      CULTURE, URINE COMPREHENSIVE  Nephrolithiasis     Repeat CT abdomen given left flank pain.    Relevant Orders      CT Abdomen Pelvis Wo Contrast   Recurrent falls     Recurrent falls last 3 months resulting in rib fractures and prolonged rehab stay. Will set up home health assessment.    Relevant Orders      Ambulatory referral to [place]       Return in about  4 weeks (around 12/16/2013) for Recheck.

## 2013-11-18 NOTE — Assessment & Plan Note (Signed)
Recurrent falls last 3 months resulting in rib fractures and prolonged rehab stay. Will set up home health assessment.

## 2013-11-18 NOTE — Assessment & Plan Note (Signed)
Repeat CT abdomen given left flank pain.

## 2013-11-18 NOTE — Telephone Encounter (Signed)
CT scan showed small kidney stone on the left, however this was not large enough to obstruct urine flow. I believe her symptoms may be secondary to urinary tract infection. I would recommend starting the antibiotics as we discussed, with follow up if symptoms are not improving.

## 2013-11-18 NOTE — Assessment & Plan Note (Signed)
Symptoms consistent with UTI. Will send urine for culture and start Cefuroxime. Will get CT abdomen to further evaluate left flank pain given previous h/o kidney stone.

## 2013-11-18 NOTE — Telephone Encounter (Signed)
No answer, no voicemail, will call back

## 2013-11-18 NOTE — Progress Notes (Signed)
Pre visit review using our clinic review tool, if applicable. No additional management support is needed unless otherwise documented below in the visit note. 

## 2013-11-20 ENCOUNTER — Encounter: Payer: Self-pay | Admitting: Internal Medicine

## 2013-11-21 DIAGNOSIS — E039 Hypothyroidism, unspecified: Secondary | ICD-10-CM | POA: Diagnosis not present

## 2013-11-21 DIAGNOSIS — S2241XD Multiple fractures of ribs, right side, subsequent encounter for fracture with routine healing: Secondary | ICD-10-CM | POA: Diagnosis not present

## 2013-11-21 DIAGNOSIS — I10 Essential (primary) hypertension: Secondary | ICD-10-CM | POA: Diagnosis not present

## 2013-11-21 DIAGNOSIS — M159 Polyosteoarthritis, unspecified: Secondary | ICD-10-CM | POA: Diagnosis not present

## 2013-11-21 DIAGNOSIS — Z87440 Personal history of urinary (tract) infections: Secondary | ICD-10-CM | POA: Diagnosis not present

## 2013-11-21 DIAGNOSIS — Z9181 History of falling: Secondary | ICD-10-CM | POA: Diagnosis not present

## 2013-11-21 NOTE — Telephone Encounter (Signed)
Notified pt of results 

## 2013-11-21 NOTE — Telephone Encounter (Signed)
Left message for pt to return my call.

## 2013-11-23 DIAGNOSIS — M159 Polyosteoarthritis, unspecified: Secondary | ICD-10-CM | POA: Diagnosis not present

## 2013-11-23 DIAGNOSIS — Z87440 Personal history of urinary (tract) infections: Secondary | ICD-10-CM | POA: Diagnosis not present

## 2013-11-23 DIAGNOSIS — E039 Hypothyroidism, unspecified: Secondary | ICD-10-CM | POA: Diagnosis not present

## 2013-11-23 DIAGNOSIS — I10 Essential (primary) hypertension: Secondary | ICD-10-CM | POA: Diagnosis not present

## 2013-11-23 DIAGNOSIS — S2241XD Multiple fractures of ribs, right side, subsequent encounter for fracture with routine healing: Secondary | ICD-10-CM | POA: Diagnosis not present

## 2013-11-23 DIAGNOSIS — Z9181 History of falling: Secondary | ICD-10-CM | POA: Diagnosis not present

## 2013-11-24 DIAGNOSIS — E039 Hypothyroidism, unspecified: Secondary | ICD-10-CM | POA: Diagnosis not present

## 2013-11-24 DIAGNOSIS — M159 Polyosteoarthritis, unspecified: Secondary | ICD-10-CM | POA: Diagnosis not present

## 2013-11-24 DIAGNOSIS — I10 Essential (primary) hypertension: Secondary | ICD-10-CM | POA: Diagnosis not present

## 2013-11-24 DIAGNOSIS — S2241XD Multiple fractures of ribs, right side, subsequent encounter for fracture with routine healing: Secondary | ICD-10-CM | POA: Diagnosis not present

## 2013-11-24 DIAGNOSIS — Z9181 History of falling: Secondary | ICD-10-CM | POA: Diagnosis not present

## 2013-11-24 DIAGNOSIS — Z87440 Personal history of urinary (tract) infections: Secondary | ICD-10-CM | POA: Diagnosis not present

## 2013-11-24 NOTE — Telephone Encounter (Signed)
Pt would like a referral to a urologist

## 2013-11-24 NOTE — Addendum Note (Signed)
Addended by: Ronette Deter A on: 11/24/2013 12:48 PM   Modules accepted: Orders

## 2013-11-28 DIAGNOSIS — Z9181 History of falling: Secondary | ICD-10-CM | POA: Diagnosis not present

## 2013-11-28 DIAGNOSIS — S2241XD Multiple fractures of ribs, right side, subsequent encounter for fracture with routine healing: Secondary | ICD-10-CM | POA: Diagnosis not present

## 2013-11-28 DIAGNOSIS — E039 Hypothyroidism, unspecified: Secondary | ICD-10-CM | POA: Diagnosis not present

## 2013-11-28 DIAGNOSIS — Z87440 Personal history of urinary (tract) infections: Secondary | ICD-10-CM | POA: Diagnosis not present

## 2013-11-28 DIAGNOSIS — I10 Essential (primary) hypertension: Secondary | ICD-10-CM | POA: Diagnosis not present

## 2013-11-28 DIAGNOSIS — M159 Polyosteoarthritis, unspecified: Secondary | ICD-10-CM | POA: Diagnosis not present

## 2013-11-29 DIAGNOSIS — M159 Polyosteoarthritis, unspecified: Secondary | ICD-10-CM | POA: Diagnosis not present

## 2013-11-29 DIAGNOSIS — Z87440 Personal history of urinary (tract) infections: Secondary | ICD-10-CM | POA: Diagnosis not present

## 2013-11-29 DIAGNOSIS — Z9181 History of falling: Secondary | ICD-10-CM | POA: Diagnosis not present

## 2013-11-29 DIAGNOSIS — E039 Hypothyroidism, unspecified: Secondary | ICD-10-CM | POA: Diagnosis not present

## 2013-11-29 DIAGNOSIS — I10 Essential (primary) hypertension: Secondary | ICD-10-CM | POA: Diagnosis not present

## 2013-11-29 DIAGNOSIS — S2241XD Multiple fractures of ribs, right side, subsequent encounter for fracture with routine healing: Secondary | ICD-10-CM | POA: Diagnosis not present

## 2013-11-30 DIAGNOSIS — E039 Hypothyroidism, unspecified: Secondary | ICD-10-CM | POA: Diagnosis not present

## 2013-11-30 DIAGNOSIS — S2241XD Multiple fractures of ribs, right side, subsequent encounter for fracture with routine healing: Secondary | ICD-10-CM | POA: Diagnosis not present

## 2013-11-30 DIAGNOSIS — I10 Essential (primary) hypertension: Secondary | ICD-10-CM | POA: Diagnosis not present

## 2013-11-30 DIAGNOSIS — Z87440 Personal history of urinary (tract) infections: Secondary | ICD-10-CM | POA: Diagnosis not present

## 2013-11-30 DIAGNOSIS — M159 Polyosteoarthritis, unspecified: Secondary | ICD-10-CM | POA: Diagnosis not present

## 2013-11-30 DIAGNOSIS — Z9181 History of falling: Secondary | ICD-10-CM | POA: Diagnosis not present

## 2013-12-01 DIAGNOSIS — S2241XD Multiple fractures of ribs, right side, subsequent encounter for fracture with routine healing: Secondary | ICD-10-CM | POA: Diagnosis not present

## 2013-12-01 DIAGNOSIS — Z9181 History of falling: Secondary | ICD-10-CM | POA: Diagnosis not present

## 2013-12-01 DIAGNOSIS — Z87440 Personal history of urinary (tract) infections: Secondary | ICD-10-CM | POA: Diagnosis not present

## 2013-12-01 DIAGNOSIS — I10 Essential (primary) hypertension: Secondary | ICD-10-CM | POA: Diagnosis not present

## 2013-12-01 DIAGNOSIS — M159 Polyosteoarthritis, unspecified: Secondary | ICD-10-CM | POA: Diagnosis not present

## 2013-12-01 DIAGNOSIS — E039 Hypothyroidism, unspecified: Secondary | ICD-10-CM | POA: Diagnosis not present

## 2013-12-03 ENCOUNTER — Emergency Department: Payer: Self-pay | Admitting: Emergency Medicine

## 2013-12-03 DIAGNOSIS — S199XXA Unspecified injury of neck, initial encounter: Secondary | ICD-10-CM | POA: Diagnosis not present

## 2013-12-03 DIAGNOSIS — Z7902 Long term (current) use of antithrombotics/antiplatelets: Secondary | ICD-10-CM | POA: Diagnosis not present

## 2013-12-03 DIAGNOSIS — S0003XA Contusion of scalp, initial encounter: Secondary | ICD-10-CM | POA: Diagnosis not present

## 2013-12-03 DIAGNOSIS — R51 Headache: Secondary | ICD-10-CM | POA: Diagnosis not present

## 2013-12-03 DIAGNOSIS — Z79891 Long term (current) use of opiate analgesic: Secondary | ICD-10-CM | POA: Diagnosis not present

## 2013-12-03 DIAGNOSIS — S0990XA Unspecified injury of head, initial encounter: Secondary | ICD-10-CM | POA: Diagnosis not present

## 2013-12-03 DIAGNOSIS — S0093XA Contusion of unspecified part of head, initial encounter: Secondary | ICD-10-CM | POA: Diagnosis not present

## 2013-12-03 DIAGNOSIS — S61401A Unspecified open wound of right hand, initial encounter: Secondary | ICD-10-CM | POA: Diagnosis not present

## 2013-12-03 DIAGNOSIS — Z880 Allergy status to penicillin: Secondary | ICD-10-CM | POA: Diagnosis not present

## 2013-12-03 DIAGNOSIS — W19XXXA Unspecified fall, initial encounter: Secondary | ICD-10-CM | POA: Diagnosis not present

## 2013-12-03 DIAGNOSIS — S0190XA Unspecified open wound of unspecified part of head, initial encounter: Secondary | ICD-10-CM | POA: Diagnosis not present

## 2013-12-03 DIAGNOSIS — M542 Cervicalgia: Secondary | ICD-10-CM | POA: Diagnosis not present

## 2013-12-03 DIAGNOSIS — B9620 Unspecified Escherichia coli [E. coli] as the cause of diseases classified elsewhere: Secondary | ICD-10-CM | POA: Diagnosis not present

## 2013-12-03 DIAGNOSIS — Z79899 Other long term (current) drug therapy: Secondary | ICD-10-CM | POA: Diagnosis not present

## 2013-12-03 DIAGNOSIS — S06360A Traumatic hemorrhage of cerebrum, unspecified, without loss of consciousness, initial encounter: Secondary | ICD-10-CM | POA: Diagnosis not present

## 2013-12-03 DIAGNOSIS — S0001XA Abrasion of scalp, initial encounter: Secondary | ICD-10-CM | POA: Diagnosis not present

## 2013-12-03 DIAGNOSIS — N390 Urinary tract infection, site not specified: Secondary | ICD-10-CM | POA: Diagnosis not present

## 2013-12-04 LAB — URINALYSIS, COMPLETE
Bilirubin,UR: NEGATIVE
GLUCOSE, UR: NEGATIVE mg/dL (ref 0–75)
KETONE: NEGATIVE
Nitrite: NEGATIVE
Ph: 5 (ref 4.5–8.0)
Protein: 100
RBC,UR: 29 /HPF (ref 0–5)
Specific Gravity: 1.019 (ref 1.003–1.030)
Squamous Epithelial: NONE SEEN
WBC UR: 126 /HPF (ref 0–5)

## 2013-12-05 ENCOUNTER — Telehealth: Payer: Self-pay | Admitting: *Deleted

## 2013-12-05 DIAGNOSIS — Z87440 Personal history of urinary (tract) infections: Secondary | ICD-10-CM | POA: Diagnosis not present

## 2013-12-05 DIAGNOSIS — E039 Hypothyroidism, unspecified: Secondary | ICD-10-CM | POA: Diagnosis not present

## 2013-12-05 DIAGNOSIS — I10 Essential (primary) hypertension: Secondary | ICD-10-CM | POA: Diagnosis not present

## 2013-12-05 DIAGNOSIS — S2241XD Multiple fractures of ribs, right side, subsequent encounter for fracture with routine healing: Secondary | ICD-10-CM | POA: Diagnosis not present

## 2013-12-05 DIAGNOSIS — M159 Polyosteoarthritis, unspecified: Secondary | ICD-10-CM | POA: Diagnosis not present

## 2013-12-05 DIAGNOSIS — Z9181 History of falling: Secondary | ICD-10-CM | POA: Diagnosis not present

## 2013-12-05 NOTE — Telephone Encounter (Signed)
[facility] called with FYI for PCP. Pt fell over the weekend and hit head. Was seen in [facility] ED, had CT head. Has hematoma on head. Skin tear on right elbow that was treated. Pt currently stable. Has appt already scheduled on Friday with Dr. Gilford Rile. Requested records for appointment.

## 2013-12-06 ENCOUNTER — Telehealth: Payer: Self-pay | Admitting: *Deleted

## 2013-12-06 ENCOUNTER — Ambulatory Visit: Payer: Medicare Other | Admitting: Internal Medicine

## 2013-12-06 LAB — URINE CULTURE

## 2013-12-06 NOTE — Telephone Encounter (Signed)
Per [facility] lab results urinalysis is positive for UTI, Pt states that she was not given anything for this, Pt states that she always keeps a UTI.  Will send a request to [facility] for results of urine culture on Wednesday, since culture was put in yesterday.

## 2013-12-06 NOTE — Telephone Encounter (Signed)
If she is not having symptoms, let's wait and try to get the results of the culture today. She has multiple antibiotic allergies, so will need to look at sensitivities to determine what to start her on.

## 2013-12-07 ENCOUNTER — Telehealth: Payer: Self-pay | Admitting: Internal Medicine

## 2013-12-07 DIAGNOSIS — E039 Hypothyroidism, unspecified: Secondary | ICD-10-CM | POA: Diagnosis not present

## 2013-12-07 DIAGNOSIS — M159 Polyosteoarthritis, unspecified: Secondary | ICD-10-CM | POA: Diagnosis not present

## 2013-12-07 DIAGNOSIS — Z9181 History of falling: Secondary | ICD-10-CM | POA: Diagnosis not present

## 2013-12-07 DIAGNOSIS — Z87440 Personal history of urinary (tract) infections: Secondary | ICD-10-CM | POA: Diagnosis not present

## 2013-12-07 DIAGNOSIS — I10 Essential (primary) hypertension: Secondary | ICD-10-CM | POA: Diagnosis not present

## 2013-12-07 DIAGNOSIS — S2241XD Multiple fractures of ribs, right side, subsequent encounter for fracture with routine healing: Secondary | ICD-10-CM | POA: Diagnosis not present

## 2013-12-07 DIAGNOSIS — N3001 Acute cystitis with hematuria: Secondary | ICD-10-CM

## 2013-12-07 MED ORDER — CEFUROXIME AXETIL 250 MG PO TABS: 250.0000 mg | ORAL_TABLET | Freq: Two times a day (BID) | ORAL | Status: DC

## 2013-12-07 NOTE — Telephone Encounter (Signed)
OK. I sent in order for Cefuroxime.

## 2013-12-07 NOTE — Telephone Encounter (Signed)
Culture received, see other telephone encounter.

## 2013-12-07 NOTE — Telephone Encounter (Signed)
Pt.notified

## 2013-12-07 NOTE — Telephone Encounter (Signed)
Urine culture showed infection that was sensitive to all antibiotics tested, however she has numerous allergies. I would like to start Doxycycline 100mg  po bid x 7 days. Please confirm no allergy to this.

## 2013-12-07 NOTE — Telephone Encounter (Signed)
Pt states that she is allergic to Doxycycline. Added to list. States not allergic to Cefuroxime, took this last UTI.

## 2013-12-08 DIAGNOSIS — I6529 Occlusion and stenosis of unspecified carotid artery: Secondary | ICD-10-CM | POA: Diagnosis not present

## 2013-12-08 DIAGNOSIS — I739 Peripheral vascular disease, unspecified: Secondary | ICD-10-CM | POA: Diagnosis not present

## 2013-12-08 DIAGNOSIS — M79609 Pain in unspecified limb: Secondary | ICD-10-CM | POA: Diagnosis not present

## 2013-12-08 DIAGNOSIS — I872 Venous insufficiency (chronic) (peripheral): Secondary | ICD-10-CM | POA: Diagnosis not present

## 2013-12-09 ENCOUNTER — Encounter: Payer: Self-pay | Admitting: Internal Medicine

## 2013-12-09 ENCOUNTER — Ambulatory Visit (INDEPENDENT_AMBULATORY_CARE_PROVIDER_SITE_OTHER): Payer: Medicare Other | Admitting: Internal Medicine

## 2013-12-09 VITALS — BP 120/56 | HR 63 | Temp 97.8°F | Ht 60.8 in | Wt 113.8 lb

## 2013-12-09 DIAGNOSIS — N3001 Acute cystitis with hematuria: Secondary | ICD-10-CM

## 2013-12-09 DIAGNOSIS — T148XXA Other injury of unspecified body region, initial encounter: Secondary | ICD-10-CM

## 2013-12-09 DIAGNOSIS — M1990 Unspecified osteoarthritis, unspecified site: Secondary | ICD-10-CM

## 2013-12-09 DIAGNOSIS — E538 Deficiency of other specified B group vitamins: Secondary | ICD-10-CM

## 2013-12-09 DIAGNOSIS — R296 Repeated falls: Secondary | ICD-10-CM | POA: Diagnosis not present

## 2013-12-09 DIAGNOSIS — T148 Other injury of unspecified body region: Secondary | ICD-10-CM | POA: Diagnosis not present

## 2013-12-09 MED ORDER — HYDROCODONE-ACETAMINOPHEN 5-325 MG PO TABS: 1.0000 | ORAL_TABLET | ORAL | Status: DC | PRN

## 2013-12-09 MED ORDER — CYANOCOBALAMIN 1000 MCG/ML IJ SOLN
1000.0000 ug | Freq: Once | INTRAMUSCULAR | Status: AC
  Administered 2013-12-09: 1000 ug via INTRAMUSCULAR

## 2013-12-09 NOTE — Assessment & Plan Note (Signed)
Chronic severe OA. Pain well controlled with meloxicam and prn hydrocodone. Will continue.

## 2013-12-09 NOTE — Assessment & Plan Note (Signed)
Hematoma scalp after fall.  Appears to be healing well. Will continue to monitor.

## 2013-12-09 NOTE — Patient Instructions (Addendum)
Follow up with Dr. Erlene Quan as scheduled.  Follow up in 4 weeks and sooner as needed.

## 2013-12-09 NOTE — Progress Notes (Signed)
Pre visit review using our clinic review tool, if applicable. No additional management support is needed unless otherwise documented below in the visit note. 

## 2013-12-09 NOTE — Assessment & Plan Note (Signed)
Persistent UTI. Recent urine culture from [facility] showed infection sensitive to Cefuroxime. Will continue. Follow up with urology as scheduled. Question if left kidney stone serving as nidus for persistent infection.

## 2013-12-09 NOTE — Assessment & Plan Note (Signed)
Recurrent falls, most recently with superficial head injury. Discussed falls prevention. Will continue 24hr supervision at home.

## 2013-12-09 NOTE — Progress Notes (Signed)
Subjective:    Patient ID: Krista Ellis, female    DOB: [birth year], 80 y.o.   MRN: [id number]  HPI 80YO female presents for follow up.  Seen 10/30 with flank pain. CT showed small kidney stone on the left. Referral made to Urology. Appointment scheduled with Dr. Erlene Quan, Nov 30th.  Continues to have intermittent left flank pain. Severe at times. Taking hydrocodone with some improvement.  Started on Cefuroxime yesterday for UTI.  [place] last Saturday night when getting out of bed. Hit head on nightstand. Called for help on Alert device. Daughter came. Went to the ED. Had CT head and neck which were normal. Had urine culture which was pos for infection. Started on Cefuroxime. No headache or other symptoms. Family is working on Personal assistant during this time. Continues with PT at home.  Wt Readings from Last 3 Encounters:  12/09/13 113 lb 12 oz (51.597 kg)  11/18/13 116 lb (52.617 kg)  08/31/13 121 lb 8 oz (55.112 kg)   Seen by Dr. Delana Meyer yesterday. No changes made.  Review of Systems  Constitutional: Negative for fever, chills, appetite change, fatigue and unexpected weight change.  Eyes: Negative for visual disturbance.  Respiratory: Negative for shortness of breath.   Cardiovascular: Negative for chest pain and leg swelling.  Gastrointestinal: Positive for abdominal pain. Negative for nausea, vomiting, diarrhea and constipation.  Genitourinary: Positive for flank pain. Negative for urgency and frequency.  Musculoskeletal: Positive for myalgias, arthralgias and gait problem.  Skin: Positive for color change and wound. Negative for rash.  Hematological: Negative for adenopathy. Does not bruise/bleed easily.  Psychiatric/Behavioral: Negative for dysphoric mood. The patient is not nervous/anxious.        Objective:    BP 120/56 mmHg  Pulse 63  Temp(Src) 97.8 F (36.6 C) (Oral)  Ht 5' 0.8" (1.544 m)  Wt 113 lb 12 oz (51.597 kg)  BMI 21.64 kg/m2  SpO2 98% Physical  Exam  Constitutional: She is oriented to person, place, and time. She appears well-developed and well-nourished. No distress.  HENT:  Head: Normocephalic and atraumatic.    Right Ear: External ear normal.  Left Ear: External ear normal.  Nose: Nose normal.  Mouth/Throat: Oropharynx is clear and moist. No oropharyngeal exudate.  Eyes: Conjunctivae are normal. Pupils are equal, round, and reactive to light. Right eye exhibits no discharge. Left eye exhibits no discharge. No scleral icterus.  Neck: Normal range of motion. Neck supple. No tracheal deviation present. No thyromegaly present.  Cardiovascular: Normal rate, regular rhythm, normal heart sounds and intact distal pulses.  Exam reveals no gallop and no friction rub.   No murmur heard. Pulmonary/Chest: Effort normal and breath sounds normal. No accessory muscle usage. No tachypnea. No respiratory distress. She has no decreased breath sounds. She has no wheezes. She has no rhonchi. She has no rales. She exhibits no tenderness.  Musculoskeletal: Normal range of motion. She exhibits no edema or tenderness.  Lymphadenopathy:    She has no cervical adenopathy.  Neurological: She is alert and oriented to person, place, and time. No cranial nerve deficit. She exhibits normal muscle tone. Coordination normal.  Skin: Skin is warm and dry. No rash noted. She is not diaphoretic. No erythema. No pallor.     Psychiatric: She has a normal mood and affect. Her behavior is normal. Judgment and thought content normal.          Assessment & Plan:   Problem List Items Addressed This Visit  Unprioritized   Acute cystitis with hematuria    Persistent UTI. Recent urine culture from [facility] showed infection sensitive to Cefuroxime. Will continue. Follow up with urology as scheduled. Question if left kidney stone serving as nidus for persistent infection.    Hematoma and contusion    Hematoma scalp after fall.  Appears to be healing well. Will  continue to monitor.    Osteoarthritis    Chronic severe OA. Pain well controlled with meloxicam and prn hydrocodone. Will continue.    Relevant Medications      HYDROcodone-acetaminophen (NORCO/VICODIN) 5-325 MG per tablet   Recurrent falls - Primary    Recurrent falls, most recently with superficial head injury. Discussed falls prevention. Will continue 24hr supervision at home.     Other Visit Diagnoses    B12 deficiency        Relevant Medications       cyanocobalamin ((VITAMIN B-12)) injection 1,000 mcg (Completed)        Return in about 4 weeks (around 01/06/2014).

## 2013-12-10 DIAGNOSIS — Z9181 History of falling: Secondary | ICD-10-CM | POA: Diagnosis not present

## 2013-12-10 DIAGNOSIS — E039 Hypothyroidism, unspecified: Secondary | ICD-10-CM | POA: Diagnosis not present

## 2013-12-10 DIAGNOSIS — I10 Essential (primary) hypertension: Secondary | ICD-10-CM | POA: Diagnosis not present

## 2013-12-10 DIAGNOSIS — M159 Polyosteoarthritis, unspecified: Secondary | ICD-10-CM | POA: Diagnosis not present

## 2013-12-10 DIAGNOSIS — Z87440 Personal history of urinary (tract) infections: Secondary | ICD-10-CM | POA: Diagnosis not present

## 2013-12-10 DIAGNOSIS — S2241XD Multiple fractures of ribs, right side, subsequent encounter for fracture with routine healing: Secondary | ICD-10-CM | POA: Diagnosis not present

## 2013-12-12 DIAGNOSIS — Z87440 Personal history of urinary (tract) infections: Secondary | ICD-10-CM | POA: Diagnosis not present

## 2013-12-12 DIAGNOSIS — I10 Essential (primary) hypertension: Secondary | ICD-10-CM | POA: Diagnosis not present

## 2013-12-12 DIAGNOSIS — E039 Hypothyroidism, unspecified: Secondary | ICD-10-CM | POA: Diagnosis not present

## 2013-12-12 DIAGNOSIS — S2241XD Multiple fractures of ribs, right side, subsequent encounter for fracture with routine healing: Secondary | ICD-10-CM | POA: Diagnosis not present

## 2013-12-12 DIAGNOSIS — Z9181 History of falling: Secondary | ICD-10-CM | POA: Diagnosis not present

## 2013-12-12 DIAGNOSIS — M159 Polyosteoarthritis, unspecified: Secondary | ICD-10-CM | POA: Diagnosis not present

## 2013-12-13 DIAGNOSIS — M159 Polyosteoarthritis, unspecified: Secondary | ICD-10-CM | POA: Diagnosis not present

## 2013-12-13 DIAGNOSIS — I10 Essential (primary) hypertension: Secondary | ICD-10-CM | POA: Diagnosis not present

## 2013-12-13 DIAGNOSIS — Z87440 Personal history of urinary (tract) infections: Secondary | ICD-10-CM | POA: Diagnosis not present

## 2013-12-13 DIAGNOSIS — Z9181 History of falling: Secondary | ICD-10-CM | POA: Diagnosis not present

## 2013-12-13 DIAGNOSIS — S2241XD Multiple fractures of ribs, right side, subsequent encounter for fracture with routine healing: Secondary | ICD-10-CM | POA: Diagnosis not present

## 2013-12-13 DIAGNOSIS — E039 Hypothyroidism, unspecified: Secondary | ICD-10-CM | POA: Diagnosis not present

## 2013-12-19 DIAGNOSIS — Z87442 Personal history of urinary calculi: Secondary | ICD-10-CM | POA: Diagnosis not present

## 2013-12-19 DIAGNOSIS — R350 Frequency of micturition: Secondary | ICD-10-CM | POA: Diagnosis not present

## 2013-12-19 DIAGNOSIS — N390 Urinary tract infection, site not specified: Secondary | ICD-10-CM | POA: Diagnosis not present

## 2013-12-19 DIAGNOSIS — N3941 Urge incontinence: Secondary | ICD-10-CM | POA: Diagnosis not present

## 2013-12-20 ENCOUNTER — Encounter: Payer: Self-pay | Admitting: Internal Medicine

## 2013-12-20 DIAGNOSIS — M159 Polyosteoarthritis, unspecified: Secondary | ICD-10-CM | POA: Diagnosis not present

## 2013-12-20 DIAGNOSIS — Z87440 Personal history of urinary (tract) infections: Secondary | ICD-10-CM | POA: Diagnosis not present

## 2013-12-20 DIAGNOSIS — I10 Essential (primary) hypertension: Secondary | ICD-10-CM | POA: Diagnosis not present

## 2013-12-20 DIAGNOSIS — E039 Hypothyroidism, unspecified: Secondary | ICD-10-CM | POA: Diagnosis not present

## 2013-12-20 DIAGNOSIS — S2241XD Multiple fractures of ribs, right side, subsequent encounter for fracture with routine healing: Secondary | ICD-10-CM | POA: Diagnosis not present

## 2013-12-20 DIAGNOSIS — Z9181 History of falling: Secondary | ICD-10-CM | POA: Diagnosis not present

## 2013-12-21 ENCOUNTER — Encounter: Payer: Self-pay | Admitting: Internal Medicine

## 2013-12-22 DIAGNOSIS — I10 Essential (primary) hypertension: Secondary | ICD-10-CM | POA: Diagnosis not present

## 2013-12-22 DIAGNOSIS — Z87440 Personal history of urinary (tract) infections: Secondary | ICD-10-CM | POA: Diagnosis not present

## 2013-12-22 DIAGNOSIS — Z9181 History of falling: Secondary | ICD-10-CM | POA: Diagnosis not present

## 2013-12-22 DIAGNOSIS — M159 Polyosteoarthritis, unspecified: Secondary | ICD-10-CM | POA: Diagnosis not present

## 2013-12-22 DIAGNOSIS — E039 Hypothyroidism, unspecified: Secondary | ICD-10-CM | POA: Diagnosis not present

## 2013-12-22 DIAGNOSIS — S2241XD Multiple fractures of ribs, right side, subsequent encounter for fracture with routine healing: Secondary | ICD-10-CM | POA: Diagnosis not present

## 2013-12-26 ENCOUNTER — Other Ambulatory Visit: Payer: Self-pay | Admitting: Internal Medicine

## 2014-01-03 ENCOUNTER — Ambulatory Visit (INDEPENDENT_AMBULATORY_CARE_PROVIDER_SITE_OTHER): Payer: Medicare Other | Admitting: Internal Medicine

## 2014-01-03 ENCOUNTER — Encounter: Payer: Self-pay | Admitting: Internal Medicine

## 2014-01-03 VITALS — BP 120/56 | HR 66 | Temp 98.3°F | Ht 60.8 in | Wt 115.0 lb

## 2014-01-03 DIAGNOSIS — R208 Other disturbances of skin sensation: Secondary | ICD-10-CM

## 2014-01-03 DIAGNOSIS — E039 Hypothyroidism, unspecified: Secondary | ICD-10-CM

## 2014-01-03 DIAGNOSIS — N390 Urinary tract infection, site not specified: Secondary | ICD-10-CM | POA: Insufficient documentation

## 2014-01-03 DIAGNOSIS — I10 Essential (primary) hypertension: Secondary | ICD-10-CM | POA: Diagnosis not present

## 2014-01-03 DIAGNOSIS — I70209 Unspecified atherosclerosis of native arteries of extremities, unspecified extremity: Secondary | ICD-10-CM

## 2014-01-03 DIAGNOSIS — R200 Anesthesia of skin: Secondary | ICD-10-CM | POA: Insufficient documentation

## 2014-01-03 DIAGNOSIS — N3001 Acute cystitis with hematuria: Secondary | ICD-10-CM | POA: Diagnosis not present

## 2014-01-03 MED ORDER — CEFUROXIME AXETIL 250 MG PO TABS: 250.0000 mg | ORAL_TABLET | Freq: Two times a day (BID) | ORAL | Status: DC

## 2014-01-03 NOTE — Assessment & Plan Note (Signed)
Will check TSH with labs today. 

## 2014-01-03 NOTE — Assessment & Plan Note (Signed)
Recurrent UTI. Will request recent notes from urology evaluation UA and culture sent today. Will restart Cefuroxime given current symptoms.

## 2014-01-03 NOTE — Assessment & Plan Note (Signed)
Bilateral hand numbness likely related to cervical spine DJD and nerve compression. Will set up evaluation with neurology. Question if EMG testing might be helpful for further evaluation.

## 2014-01-03 NOTE — Assessment & Plan Note (Signed)
BP Readings from Last 3 Encounters:  01/03/14 120/56  12/09/13 120/56  11/18/13 106/58   BP well controlled on current medication. Will continue.

## 2014-01-03 NOTE — Progress Notes (Signed)
Pre visit review using our clinic review tool, if applicable. No additional management support is needed unless otherwise documented below in the visit note. 

## 2014-01-03 NOTE — Patient Instructions (Signed)
Labs today.  Follow up in 4 weeks. 

## 2014-01-03 NOTE — Progress Notes (Signed)
Subjective:    Patient ID: Krista Ellis, female    DOB: 09/01/1930, 80 y.o.   MRN: [id number]  HPI 80YO female presents for follow up.  Seen by urology 11/30. She reports no changes made during this visit. Continues to feel like she has UTI with burning, frequency. Out of Cefuroxime.  Feeling better after fall last month. Soreness over head has resolved.  Concerned about bilateral hand numbness. This has been present for several months. She is worried that numbness and decreased grip strength will affect her ability to function. She was seen in the past by neurosurgery and told she had signif  Past medical, surgical, family and social history per today's encounter.  Review of Systems  Constitutional: Negative for fever, chills, appetite change, fatigue and unexpected weight change.  Eyes: Negative for visual disturbance.  Respiratory: Negative for shortness of breath.   Cardiovascular: Negative for chest pain and leg swelling.  Gastrointestinal: Negative for abdominal pain.  Genitourinary: Positive for dysuria, urgency and frequency. Negative for hematuria, flank pain and decreased urine volume.  Skin: Negative for color change and rash.  Neurological: Positive for tremors (occasional shaking of left hand), weakness and numbness.  Hematological: Negative for adenopathy. Does not bruise/bleed easily.  Psychiatric/Behavioral: Negative for dysphoric mood. The patient is not nervous/anxious.        Objective:    BP 120/56 mmHg  Pulse 66  Temp(Src) 98.3 F (36.8 C) (Oral)  Ht 5' 0.8" (1.544 m)  Wt 115 lb (52.164 kg)  BMI 21.88 kg/m2  SpO2 96% Physical Exam  Constitutional: She is oriented to person, place, and time. She appears well-developed and well-nourished. No distress.  HENT:  Head: Normocephalic and atraumatic.  Right Ear: External ear normal.  Left Ear: External ear normal.  Nose: Nose normal.  Mouth/Throat: Oropharynx is clear and moist. No oropharyngeal  exudate.  Eyes: Conjunctivae are normal. Pupils are equal, round, and reactive to light. Right eye exhibits no discharge. Left eye exhibits no discharge. No scleral icterus.  Neck: Normal range of motion. Neck supple. No tracheal deviation present. No thyromegaly present.  Cardiovascular: Normal rate, regular rhythm, normal heart sounds and intact distal pulses.  Exam reveals no gallop and no friction rub.   No murmur heard. Pulmonary/Chest: Effort normal and breath sounds normal. No accessory muscle usage. No tachypnea. No respiratory distress. She has no decreased breath sounds. She has no wheezes. She has no rhonchi. She has no rales. She exhibits no tenderness.  Musculoskeletal: Normal range of motion. She exhibits no edema or tenderness.  Lymphadenopathy:    She has no cervical adenopathy.  Neurological: She is alert and oriented to person, place, and time. She displays no atrophy and no tremor. A sensory deficit (numbness bilateral hands noted ) is present. No cranial nerve deficit. She exhibits normal muscle tone. Gait (balance instability, requires Kirsi Hugh ) abnormal. Coordination normal.  Skin: Skin is warm and dry. No rash noted. She is not diaphoretic. No erythema. No pallor.  Psychiatric: She has a normal mood and affect. Her behavior is normal. Judgment and thought content normal.          Assessment & Plan:   Problem List Items Addressed This Visit      Unprioritized   RESOLVED: Acute cystitis with hematuria   Bilateral hand numbness    Bilateral hand numbness likely related to cervical spine DJD and nerve compression. Will set up evaluation with neurology. Question if EMG testing might be helpful for further evaluation.  Relevant Orders      Ambulatory referral to Neurology   Hypertension    BP Readings from Last 3 Encounters:  01/03/14 120/56  12/09/13 120/56  11/18/13 106/58   BP well controlled on current medication. Will continue.    Relevant Orders       Comprehensive metabolic panel   Hypothyroidism    Will check TSH with labs today.    Relevant Orders      TSH   Recurrent UTI - Primary    Recurrent UTI. Will request recent notes from urology evaluation UA and culture sent today. Will restart Cefuroxime given current symptoms.    Relevant Medications      cefUROXime (CEFTIN) tablet   Other Relevant Orders      POCT Urinalysis Dipstick      CULTURE, URINE COMPREHENSIVE      CBC with Differential       Return in about 4 weeks (around 01/31/2014) for Recheck.

## 2014-01-04 ENCOUNTER — Encounter: Payer: Self-pay | Admitting: Internal Medicine

## 2014-01-04 LAB — CBC WITH DIFFERENTIAL/PLATELET
BASOS PCT: 0.6 % (ref 0.0–3.0)
Basophils Absolute: 0 10*3/uL (ref 0.0–0.1)
EOS PCT: 1.7 % (ref 0.0–5.0)
Eosinophils Absolute: 0.1 10*3/uL (ref 0.0–0.7)
HEMATOCRIT: 42.5 % (ref 36.0–46.0)
Hemoglobin: 13.8 g/dL (ref 12.0–15.0)
Lymphocytes Relative: 34 % (ref 12.0–46.0)
Lymphs Abs: 2.1 10*3/uL (ref 0.7–4.0)
MCHC: 32.5 g/dL (ref 30.0–36.0)
MCV: 95.7 fl (ref 78.0–100.0)
MONO ABS: 0.5 10*3/uL (ref 0.1–1.0)
Monocytes Relative: 8.8 % (ref 3.0–12.0)
NEUTROS PCT: 54.9 % (ref 43.0–77.0)
Neutro Abs: 3.4 10*3/uL (ref 1.4–7.7)
Platelets: 212 10*3/uL (ref 150.0–400.0)
RBC: 4.44 Mil/uL (ref 3.87–5.11)
RDW: 13.4 % (ref 11.5–15.5)
WBC: 6.3 10*3/uL (ref 4.0–10.5)

## 2014-01-04 LAB — COMPREHENSIVE METABOLIC PANEL
ALBUMIN: 3.9 g/dL (ref 3.5–5.2)
ALK PHOS: 44 U/L (ref 39–117)
ALT: 19 U/L (ref 0–35)
AST: 24 U/L (ref 0–37)
BUN: 22 mg/dL (ref 6–23)
CALCIUM: 9.5 mg/dL (ref 8.4–10.5)
CO2: 28 mEq/L (ref 19–32)
CREATININE: 0.9 mg/dL (ref 0.4–1.2)
Chloride: 104 mEq/L (ref 96–112)
GFR: 67.21 mL/min (ref >60.00)
GLUCOSE: 115 mg/dL — AB (ref 70–99)
POTASSIUM: 5.1 meq/L (ref 3.5–5.1)
Sodium: 136 mEq/L (ref 135–145)
Total Bilirubin: 0.4 mg/dL (ref 0.2–1.2)
Total Protein: 6.7 g/dL (ref 6.0–8.3)

## 2014-01-04 LAB — TSH: TSH: 2.47 u[IU]/mL (ref 0.35–4.50)

## 2014-01-06 ENCOUNTER — Telehealth: Payer: Self-pay | Admitting: Internal Medicine

## 2014-01-06 ENCOUNTER — Emergency Department: Payer: Self-pay | Admitting: Emergency Medicine

## 2014-01-06 DIAGNOSIS — Z880 Allergy status to penicillin: Secondary | ICD-10-CM | POA: Diagnosis not present

## 2014-01-06 DIAGNOSIS — W19XXXA Unspecified fall, initial encounter: Secondary | ICD-10-CM | POA: Diagnosis not present

## 2014-01-06 DIAGNOSIS — S39012A Strain of muscle, fascia and tendon of lower back, initial encounter: Secondary | ICD-10-CM | POA: Diagnosis not present

## 2014-01-06 DIAGNOSIS — S3992XA Unspecified injury of lower back, initial encounter: Secondary | ICD-10-CM | POA: Diagnosis not present

## 2014-01-06 DIAGNOSIS — M25552 Pain in left hip: Secondary | ICD-10-CM | POA: Diagnosis not present

## 2014-01-06 DIAGNOSIS — M545 Low back pain: Secondary | ICD-10-CM | POA: Diagnosis not present

## 2014-01-06 DIAGNOSIS — S79912A Unspecified injury of left hip, initial encounter: Secondary | ICD-10-CM | POA: Diagnosis not present

## 2014-01-06 DIAGNOSIS — I10 Essential (primary) hypertension: Secondary | ICD-10-CM | POA: Diagnosis not present

## 2014-01-06 NOTE — Telephone Encounter (Signed)
[employer] nurse called to advise pt's daughter called advising that pt fell yesterday and is having a lot of pain. Daughter stated that she will have to call EMS to transport due to unable to get pt down stairs. [facility] want to let Dr. Gilford Rile know that she is informing the daughter to take the pt to the [facility]

## 2014-01-06 NOTE — Telephone Encounter (Signed)
FYI

## 2014-01-20 ENCOUNTER — Encounter: Payer: Self-pay | Admitting: Internal Medicine

## 2014-01-23 ENCOUNTER — Telehealth: Payer: Self-pay | Admitting: *Deleted

## 2014-01-23 NOTE — Telephone Encounter (Signed)
We will need to see police report first

## 2014-01-23 NOTE — Telephone Encounter (Signed)
Pt's daughter, Ronald Pippins, came into the office today.  She stated that Pt's half bottle of pain medication, HYDROcodone-acetaminophen (NORCO/VICODIN) is missing from her house.  She states that she has searched the house up and down and there is no sign of the medication in the house.  She was advised to call the police department to file a report.  Ronald Pippins is agreeable to doing this and will be filling the report today.  Pt will need some pain medication to hold her over until her appt with you on 02/07/14.  Please advise.

## 2014-01-24 DIAGNOSIS — E782 Mixed hyperlipidemia: Secondary | ICD-10-CM | POA: Diagnosis not present

## 2014-01-24 DIAGNOSIS — I38 Endocarditis, valve unspecified: Secondary | ICD-10-CM | POA: Diagnosis not present

## 2014-01-24 DIAGNOSIS — I10 Essential (primary) hypertension: Secondary | ICD-10-CM | POA: Diagnosis not present

## 2014-01-24 DIAGNOSIS — I739 Peripheral vascular disease, unspecified: Secondary | ICD-10-CM | POA: Diagnosis not present

## 2014-01-24 NOTE — Telephone Encounter (Signed)
Pt daughter, Ronald Pippins, states that her and pt discussed filling the police report and pt decided that she did not want to file a report.  [place] that a police report would be required for a Rx to be given.

## 2014-01-26 NOTE — Telephone Encounter (Signed)
error 

## 2014-01-31 DIAGNOSIS — N390 Urinary tract infection, site not specified: Secondary | ICD-10-CM | POA: Diagnosis not present

## 2014-01-31 DIAGNOSIS — R350 Frequency of micturition: Secondary | ICD-10-CM | POA: Diagnosis not present

## 2014-01-31 DIAGNOSIS — Z87442 Personal history of urinary calculi: Secondary | ICD-10-CM | POA: Diagnosis not present

## 2014-01-31 DIAGNOSIS — R300 Dysuria: Secondary | ICD-10-CM | POA: Diagnosis not present

## 2014-02-07 ENCOUNTER — Telehealth: Payer: Self-pay | Admitting: Internal Medicine

## 2014-02-07 ENCOUNTER — Ambulatory Visit: Payer: Medicare Other | Admitting: Internal Medicine

## 2014-02-07 DIAGNOSIS — M1990 Unspecified osteoarthritis, unspecified site: Secondary | ICD-10-CM

## 2014-02-07 MED ORDER — HYDROCODONE-ACETAMINOPHEN 5-325 MG PO TABS: 1.0000 | ORAL_TABLET | ORAL | Status: DC | PRN

## 2014-02-07 NOTE — Telephone Encounter (Signed)
Pt needs a refill on pain rx. Please advise pt/msn

## 2014-02-07 NOTE — Telephone Encounter (Signed)
Rx is ready for pick up,  Notified pt

## 2014-02-07 NOTE — Telephone Encounter (Signed)
Fine to refill Hydrocodone 

## 2014-02-07 NOTE — Telephone Encounter (Signed)
Pt was on the schedule to be seen today, Okay to refill?

## 2014-02-21 DIAGNOSIS — M12812 Other specific arthropathies, not elsewhere classified, left shoulder: Secondary | ICD-10-CM | POA: Diagnosis not present

## 2014-02-21 DIAGNOSIS — M19012 Primary osteoarthritis, left shoulder: Secondary | ICD-10-CM | POA: Diagnosis not present

## 2014-02-23 DIAGNOSIS — G959 Disease of spinal cord, unspecified: Secondary | ICD-10-CM | POA: Diagnosis not present

## 2014-02-23 DIAGNOSIS — R001 Bradycardia, unspecified: Secondary | ICD-10-CM | POA: Diagnosis not present

## 2014-02-23 DIAGNOSIS — E782 Mixed hyperlipidemia: Secondary | ICD-10-CM | POA: Diagnosis not present

## 2014-02-23 DIAGNOSIS — I2510 Atherosclerotic heart disease of native coronary artery without angina pectoris: Secondary | ICD-10-CM | POA: Insufficient documentation

## 2014-02-23 DIAGNOSIS — R200 Anesthesia of skin: Secondary | ICD-10-CM | POA: Diagnosis not present

## 2014-02-23 DIAGNOSIS — I10 Essential (primary) hypertension: Secondary | ICD-10-CM | POA: Diagnosis not present

## 2014-02-24 DIAGNOSIS — R001 Bradycardia, unspecified: Secondary | ICD-10-CM | POA: Insufficient documentation

## 2014-02-27 ENCOUNTER — Other Ambulatory Visit: Payer: Self-pay | Admitting: Internal Medicine

## 2014-03-09 ENCOUNTER — Encounter: Payer: Self-pay | Admitting: Internal Medicine

## 2014-03-09 ENCOUNTER — Ambulatory Visit (INDEPENDENT_AMBULATORY_CARE_PROVIDER_SITE_OTHER): Payer: Medicare Other | Admitting: Internal Medicine

## 2014-03-09 VITALS — BP 154/63 | HR 62 | Temp 97.7°F | Ht 60.8 in | Wt 115.1 lb

## 2014-03-09 DIAGNOSIS — R208 Other disturbances of skin sensation: Secondary | ICD-10-CM

## 2014-03-09 DIAGNOSIS — E538 Deficiency of other specified B group vitamins: Secondary | ICD-10-CM | POA: Diagnosis not present

## 2014-03-09 DIAGNOSIS — I10 Essential (primary) hypertension: Secondary | ICD-10-CM | POA: Diagnosis not present

## 2014-03-09 DIAGNOSIS — R200 Anesthesia of skin: Secondary | ICD-10-CM

## 2014-03-09 MED ORDER — CYANOCOBALAMIN 1000 MCG/ML IJ SOLN
1000.0000 ug | Freq: Once | INTRAMUSCULAR | Status: AC
  Administered 2014-03-09: 1000 ug via INTRAMUSCULAR

## 2014-03-09 NOTE — Assessment & Plan Note (Signed)
BP Readings from Last 3 Encounters:  03/09/14 154/63  01/03/14 120/56  12/09/13 120/56   BP slightly higher with lower dose of BP medication, however more appropriate for her age, and she is feeling better. Will continue to monitor.

## 2014-03-09 NOTE — Assessment & Plan Note (Signed)
B12 injection today and monthly 

## 2014-03-09 NOTE — Progress Notes (Signed)
Pre visit review using our clinic review tool, if applicable. No additional management support is needed unless otherwise documented below in the visit note. 

## 2014-03-09 NOTE — Progress Notes (Signed)
Subjective:    Patient ID: Krista Ellis, female    DOB: Jan 13, 1931, 81 y.o.   MRN: [id number]  HPI 81YO female presents for follow up.  Feeling better than previous. Stronger and not as tired. No recent falls. Cardiologist reduced dose of BP meds.  Seen by neurology. Scheduled for EMG testing and started on Lyrica to help with bilateral hand numbness, however has not yet started the medication. No change in bilateral hand numbness.   Wt Readings from Last 3 Encounters:  03/09/14 115 lb 2 oz (52.22 kg)  01/03/14 115 lb (52.164 kg)  12/09/13 113 lb 12 oz (51.597 kg)    Past medical, surgical, family and social history per today's encounter.  Review of Systems  Constitutional: Negative for fever, chills, appetite change, fatigue and unexpected weight change.  Eyes: Negative for visual disturbance.  Respiratory: Negative for shortness of breath.   Cardiovascular: Negative for chest pain and leg swelling.  Gastrointestinal: Negative for abdominal pain, diarrhea and constipation.  Musculoskeletal: Positive for myalgias and arthralgias.  Skin: Negative for color change and rash.  Neurological: Positive for numbness.  Hematological: Negative for adenopathy. Does not bruise/bleed easily.  Psychiatric/Behavioral: Negative for dysphoric mood. The patient is not nervous/anxious.        Objective:    BP 154/63 mmHg  Pulse 62  Temp(Src) 97.7 F (36.5 C) (Oral)  Ht 5' 0.8" (1.544 m)  Wt 115 lb 2 oz (52.22 kg)  BMI 21.90 kg/m2  SpO2 97% Physical Exam  Constitutional: She is oriented to person, place, and time. She appears well-developed and well-nourished. No distress.  HENT:  Head: Normocephalic and atraumatic.  Right Ear: External ear normal.  Left Ear: External ear normal.  Nose: Nose normal.  Mouth/Throat: Oropharynx is clear and moist. No oropharyngeal exudate.  Eyes: Conjunctivae are normal. Pupils are equal, round, and reactive to light. Right eye exhibits no  discharge. Left eye exhibits no discharge. No scleral icterus.  Neck: Normal range of motion. Neck supple. No tracheal deviation present. No thyromegaly present.  Cardiovascular: Normal rate, regular rhythm, normal heart sounds and intact distal pulses.  Exam reveals no gallop and no friction rub.   No murmur heard. Pulmonary/Chest: Effort normal and breath sounds normal. No respiratory distress. She has no wheezes. She has no rales. She exhibits no tenderness.  Musculoskeletal: Normal range of motion. She exhibits no edema or tenderness.  Lymphadenopathy:    She has no cervical adenopathy.  Neurological: She is alert and oriented to person, place, and time. No cranial nerve deficit. She exhibits normal muscle tone. Coordination normal.  Skin: Skin is warm and dry. No rash noted. She is not diaphoretic. No erythema. No pallor.  Psychiatric: She has a normal mood and affect. Her behavior is normal. Judgment and thought content normal.          Assessment & Plan:  Over 61min of which >50% spent in face-to-face contact with patient and her daughter discussing plan of care.  Problem List Items Addressed This Visit      Unprioritized   B12 deficiency    B12 injection today and monthly.      Relevant Medications   cyanocobalamin ((VITAMIN B-12)) injection 1,000 mcg (Completed)   Bilateral hand numbness    Reviewed notes from Neurology. Plan for EMG testing and starting Lyrica. Discussed potential risks of Lyrica. Follow up in 3 months and prn.      Hypertension - Primary    BP Readings from Last 3  Encounters:  03/09/14 154/63  01/03/14 120/56  12/09/13 120/56   BP slightly higher with lower dose of BP medication, however more appropriate for her age, and she is feeling better. Will continue to monitor.      Relevant Medications   metoprolol succinate (TOPROL-XL) 25 MG 24 hr tablet   amLODipine (NORVASC) 5 MG tablet       Return in about 3 months (around 06/07/2014) for  Recheck.

## 2014-03-09 NOTE — Patient Instructions (Signed)
B12 shot today. 

## 2014-03-09 NOTE — Assessment & Plan Note (Signed)
Reviewed notes from Neurology. Plan for EMG testing and starting Lyrica. Discussed potential risks of Lyrica. Follow up in 3 months and prn.

## 2014-03-29 ENCOUNTER — Telehealth: Payer: Self-pay | Admitting: *Deleted

## 2014-03-29 ENCOUNTER — Other Ambulatory Visit: Payer: Self-pay | Admitting: *Deleted

## 2014-03-29 DIAGNOSIS — M1990 Unspecified osteoarthritis, unspecified site: Secondary | ICD-10-CM

## 2014-03-29 MED ORDER — HYDROCODONE-ACETAMINOPHEN 5-325 MG PO TABS
1.0000 | ORAL_TABLET | ORAL | Status: DC | PRN
Start: 2014-03-29 — End: 2014-05-15

## 2014-03-29 NOTE — Telephone Encounter (Signed)
Fine to refill 

## 2014-03-29 NOTE — Telephone Encounter (Signed)
Pt's daughter, Adyson, called requesting refill on HYDROcodone-acetaminophen (NORCO/VICODIN) 5-325 MG per tablet .  Last filled on 02/07/14 #90, Okay to refill?

## 2014-03-30 NOTE — Telephone Encounter (Signed)
Pt notified Rx ready for pickup 

## 2014-04-18 ENCOUNTER — Ambulatory Visit: Payer: Medicare Other | Admitting: Internal Medicine

## 2014-04-18 ENCOUNTER — Emergency Department: Payer: Self-pay | Admitting: Emergency Medicine

## 2014-04-18 DIAGNOSIS — N390 Urinary tract infection, site not specified: Secondary | ICD-10-CM | POA: Diagnosis not present

## 2014-04-18 DIAGNOSIS — Z880 Allergy status to penicillin: Secondary | ICD-10-CM | POA: Diagnosis not present

## 2014-04-18 DIAGNOSIS — N200 Calculus of kidney: Secondary | ICD-10-CM | POA: Diagnosis not present

## 2014-04-18 DIAGNOSIS — R300 Dysuria: Secondary | ICD-10-CM | POA: Diagnosis not present

## 2014-04-18 DIAGNOSIS — M545 Low back pain: Secondary | ICD-10-CM | POA: Diagnosis not present

## 2014-04-18 DIAGNOSIS — R319 Hematuria, unspecified: Secondary | ICD-10-CM | POA: Diagnosis not present

## 2014-04-18 DIAGNOSIS — R58 Hemorrhage, not elsewhere classified: Secondary | ICD-10-CM | POA: Diagnosis not present

## 2014-04-18 DIAGNOSIS — Z79899 Other long term (current) drug therapy: Secondary | ICD-10-CM | POA: Diagnosis not present

## 2014-04-18 DIAGNOSIS — Z7902 Long term (current) use of antithrombotics/antiplatelets: Secondary | ICD-10-CM | POA: Diagnosis not present

## 2014-04-18 DIAGNOSIS — I10 Essential (primary) hypertension: Secondary | ICD-10-CM | POA: Diagnosis not present

## 2014-04-18 LAB — URINALYSIS, COMPLETE
Bilirubin,UR: NEGATIVE
Glucose,UR: NEGATIVE mg/dL (ref 0–75)
Ketone: NEGATIVE
NITRITE: NEGATIVE
PH: 5 (ref 4.5–8.0)
Protein: 30
Specific Gravity: 1.019 (ref 1.003–1.030)
Squamous Epithelial: 1
WBC UR: 318 /HPF (ref 0–5)

## 2014-04-18 LAB — CBC WITH DIFFERENTIAL/PLATELET
BASOS ABS: 0 10*3/uL (ref 0.0–0.1)
Basophil %: 0.2 %
EOS ABS: 0.1 10*3/uL (ref 0.0–0.7)
Eosinophil %: 1.4 %
HCT: 40.6 % (ref 35.0–47.0)
HGB: 13.3 g/dL (ref 12.0–16.0)
LYMPHS ABS: 1.9 10*3/uL (ref 1.0–3.6)
Lymphocyte %: 31.1 %
MCH: 31.7 pg (ref 26.0–34.0)
MCHC: 32.8 g/dL (ref 32.0–36.0)
MCV: 97 fL (ref 80–100)
MONO ABS: 0.6 x10 3/mm (ref 0.2–0.9)
MONOS PCT: 9.1 %
Neutrophil #: 3.6 10*3/uL (ref 1.4–6.5)
Neutrophil %: 58.2 %
PLATELETS: 188 10*3/uL (ref 150–440)
RBC: 4.21 10*6/uL (ref 3.80–5.20)
RDW: 12.5 % (ref 11.5–14.5)
WBC: 6.2 10*3/uL (ref 3.6–11.0)

## 2014-04-18 LAB — BASIC METABOLIC PANEL
ANION GAP: 5 — AB (ref 7–16)
BUN: 22 mg/dL — ABNORMAL HIGH
CALCIUM: 9.5 mg/dL
Chloride: 105 mmol/L
Co2: 28 mmol/L
Creatinine: 0.85 mg/dL
EGFR (African American): >60
EGFR (Non-African Amer.): >60
Glucose: 116 mg/dL — ABNORMAL HIGH
Potassium: 4.4 mmol/L
Sodium: 138 mmol/L

## 2014-04-18 LAB — PROTIME-INR
INR: 1
Prothrombin Time: 13 secs

## 2014-04-18 LAB — APTT: Activated PTT: 28.3 secs (ref 23.6–35.9)

## 2014-04-27 ENCOUNTER — Other Ambulatory Visit: Payer: Self-pay | Admitting: Internal Medicine

## 2014-04-27 DIAGNOSIS — R319 Hematuria, unspecified: Secondary | ICD-10-CM | POA: Diagnosis not present

## 2014-04-27 DIAGNOSIS — N390 Urinary tract infection, site not specified: Secondary | ICD-10-CM | POA: Diagnosis not present

## 2014-04-27 DIAGNOSIS — R310 Gross hematuria: Secondary | ICD-10-CM | POA: Diagnosis not present

## 2014-05-03 DIAGNOSIS — H16223 Keratoconjunctivitis sicca, not specified as Sjogren's, bilateral: Secondary | ICD-10-CM | POA: Diagnosis not present

## 2014-05-03 DIAGNOSIS — N390 Urinary tract infection, site not specified: Secondary | ICD-10-CM | POA: Diagnosis not present

## 2014-05-05 ENCOUNTER — Emergency Department
Admit: 2014-05-05 | Disposition: A | Discharge status: Other Acute Inpatient Hospital | Payer: Self-pay | Admitting: Student

## 2014-05-05 ENCOUNTER — Inpatient Hospital Stay (HOSPITAL_COMMUNITY)
Admission: EM | Admit: 2014-05-05 | Discharge: 2014-05-06 | DRG: 084 | Disposition: A | Discharge status: Home / Self Care | Payer: Medicare Other | Source: Other Acute Inpatient Hospital | Attending: Critical Care Medicine | Admitting: Critical Care Medicine

## 2014-05-05 DIAGNOSIS — S065XAA Traumatic subdural hemorrhage with loss of consciousness status unknown, initial encounter: Secondary | ICD-10-CM | POA: Diagnosis present

## 2014-05-05 DIAGNOSIS — M1990 Unspecified osteoarthritis, unspecified site: Secondary | ICD-10-CM | POA: Diagnosis present

## 2014-05-05 DIAGNOSIS — Y92000 Kitchen of unspecified non-institutional (private) residence as  the place of occurrence of the external cause: Secondary | ICD-10-CM | POA: Diagnosis not present

## 2014-05-05 DIAGNOSIS — Z96641 Presence of right artificial hip joint: Secondary | ICD-10-CM | POA: Diagnosis present

## 2014-05-05 DIAGNOSIS — I252 Old myocardial infarction: Secondary | ICD-10-CM | POA: Diagnosis not present

## 2014-05-05 DIAGNOSIS — Z8673 Personal history of transient ischemic attack (TIA), and cerebral infarction without residual deficits: Secondary | ICD-10-CM | POA: Diagnosis not present

## 2014-05-05 DIAGNOSIS — I2510 Atherosclerotic heart disease of native coronary artery without angina pectoris: Secondary | ICD-10-CM | POA: Diagnosis present

## 2014-05-05 DIAGNOSIS — R51 Headache: Secondary | ICD-10-CM | POA: Diagnosis not present

## 2014-05-05 DIAGNOSIS — E039 Hypothyroidism, unspecified: Secondary | ICD-10-CM | POA: Diagnosis present

## 2014-05-05 DIAGNOSIS — E785 Hyperlipidemia, unspecified: Secondary | ICD-10-CM | POA: Diagnosis present

## 2014-05-05 DIAGNOSIS — Z66 Do not resuscitate: Secondary | ICD-10-CM | POA: Diagnosis present

## 2014-05-05 DIAGNOSIS — R531 Weakness: Secondary | ICD-10-CM | POA: Diagnosis not present

## 2014-05-05 DIAGNOSIS — W010XXA Fall on same level from slipping, tripping and stumbling without subsequent striking against object, initial encounter: Secondary | ICD-10-CM | POA: Diagnosis present

## 2014-05-05 DIAGNOSIS — I739 Peripheral vascular disease, unspecified: Secondary | ICD-10-CM | POA: Diagnosis present

## 2014-05-05 DIAGNOSIS — R1011 Right upper quadrant pain: Secondary | ICD-10-CM | POA: Diagnosis present

## 2014-05-05 DIAGNOSIS — S065X9A Traumatic subdural hemorrhage with loss of consciousness of unspecified duration, initial encounter: Principal | ICD-10-CM | POA: Diagnosis present

## 2014-05-05 DIAGNOSIS — Z8679 Personal history of other diseases of the circulatory system: Secondary | ICD-10-CM | POA: Insufficient documentation

## 2014-05-05 DIAGNOSIS — I10 Essential (primary) hypertension: Secondary | ICD-10-CM | POA: Diagnosis present

## 2014-05-05 DIAGNOSIS — S065X0A Traumatic subdural hemorrhage without loss of consciousness, initial encounter: Secondary | ICD-10-CM | POA: Diagnosis not present

## 2014-05-05 DIAGNOSIS — Z880 Allergy status to penicillin: Secondary | ICD-10-CM | POA: Diagnosis not present

## 2014-05-05 DIAGNOSIS — Z7902 Long term (current) use of antithrombotics/antiplatelets: Secondary | ICD-10-CM | POA: Diagnosis not present

## 2014-05-05 DIAGNOSIS — S199XXA Unspecified injury of neck, initial encounter: Secondary | ICD-10-CM | POA: Diagnosis not present

## 2014-05-05 DIAGNOSIS — Z79899 Other long term (current) drug therapy: Secondary | ICD-10-CM

## 2014-05-05 DIAGNOSIS — S0093XA Contusion of unspecified part of head, initial encounter: Secondary | ICD-10-CM | POA: Diagnosis not present

## 2014-05-05 DIAGNOSIS — Z048 Encounter for examination and observation for other specified reasons: Secondary | ICD-10-CM | POA: Diagnosis not present

## 2014-05-05 DIAGNOSIS — I6200 Nontraumatic subdural hemorrhage, unspecified: Secondary | ICD-10-CM | POA: Diagnosis not present

## 2014-05-05 HISTORY — DX: Cardiac murmur, unspecified: R01.1

## 2014-05-05 LAB — BASIC METABOLIC PANEL
ANION GAP: 7 (ref 7–16)
BUN: 15 mg/dL
CHLORIDE: 106 mmol/L
CO2: 25 mmol/L
CREATININE: 0.58 mg/dL
Calcium, Total: 9.7 mg/dL
EGFR (Non-African Amer.): >60
Glucose: 104 mg/dL — ABNORMAL HIGH
POTASSIUM: 4.7 mmol/L
Sodium: 138 mmol/L

## 2014-05-05 LAB — CBC
HCT: 41 % (ref 35.0–47.0)
HGB: 13.7 g/dL (ref 12.0–16.0)
MCH: 32.2 pg (ref 26.0–34.0)
MCHC: 33.4 g/dL (ref 32.0–36.0)
MCV: 96 fL (ref 80–100)
PLATELETS: 259 10*3/uL (ref 150–440)
RBC: 4.25 10*6/uL (ref 3.80–5.20)
RDW: 12.8 % (ref 11.5–14.5)
WBC: 7.9 10*3/uL (ref 3.6–11.0)

## 2014-05-05 LAB — MRSA PCR SCREENING: MRSA by PCR: NEGATIVE

## 2014-05-05 LAB — PROTIME-INR
INR: 0.9
Prothrombin Time: 12.6 secs

## 2014-05-05 LAB — APTT: Activated PTT: 31.8 secs (ref 23.6–35.9)

## 2014-05-05 MED ORDER — LEVOTHYROXINE SODIUM 25 MCG PO TABS
25.0000 ug | ORAL_TABLET | Freq: Every day | ORAL | Status: DC
  Administered 2014-05-06: 25 ug via ORAL
  Filled 2014-05-05 (×2): qty 1

## 2014-05-05 MED ORDER — ENSURE ENLIVE PO LIQD
237.0000 mL | Freq: Two times a day (BID) | ORAL | Status: DC
  Administered 2014-05-06: 237 mL via ORAL

## 2014-05-05 MED ORDER — METOPROLOL SUCCINATE 12.5 MG HALF TABLET
12.5000 mg | ORAL_TABLET | Freq: Two times a day (BID) | ORAL | Status: DC
  Administered 2014-05-06: 12.5 mg via ORAL
  Filled 2014-05-05 (×3): qty 1

## 2014-05-05 MED ORDER — SODIUM CHLORIDE 0.9 % IV SOLN: 250.0000 mL | INTRAVENOUS | Status: DC | PRN

## 2014-05-05 MED ORDER — PRAVASTATIN SODIUM 40 MG PO TABS
40.0000 mg | ORAL_TABLET | Freq: Every evening | ORAL | Status: DC
  Filled 2014-05-05: qty 1

## 2014-05-05 MED ORDER — AMLODIPINE BESYLATE 5 MG PO TABS
5.0000 mg | ORAL_TABLET | Freq: Every day | ORAL | Status: DC
  Administered 2014-05-06: 5 mg via ORAL
  Filled 2014-05-05: qty 1

## 2014-05-05 NOTE — H&P (Addendum)
PULMONARY / CRITICAL CARE MEDICINE HISTORY AND PHYSICAL EXAMINATION   Name: Krista Ellis MRN: [id number] DOB: 22-Oct-1930    ADMISSION DATE:  05/05/2014  PRIMARY SERVICE: PCCM  CHIEF COMPLAINT:  SDH  BRIEF PATIENT DESCRIPTION: 56 F with SDH following mechanical fall while on Plavix. Transferred to [facility] for neurosurgical evaluation.   SIGNIFICANT EVENTS / STUDIES:  Mechanical fall with SDH on 4/15  LINES / TUBES: PIV  CULTURES: None  ANTIBIOTICS: None  HISTORY OF PRESENT ILLNESS:  Krista Ellis is an 84 F with CAD s/p remote MI, PVD, CVA, HTN, HL who presented to [facility] on 4/15 following a fall. She fell after her walker became tangled on a rug. She did not have chest pains or palpitations prior to falling. She has had some headaches recently which she attributes to a medication she was taking for UTI.   She may have lost consciousness. She does not remember activating her medical alert system. One of her daughters was rapidly summoned and activated EMS. Currently, she does not have any pain.  PAST MEDICAL HISTORY :  Past Medical History  Diagnosis Date  . Hypertension   . Hyperlipidemia   . Arthritis   . Hemihypertrophy   . Myocardial infarction   . Thyroid disease   . CVA (cerebral infarction) 2003  . Peripheral vascular disease     s/p stent right leg  . Pulmonary nodule     stable on Chest CT March 2014, Followed at [facility]   Past Surgical History  Procedure Laterality Date  . Abdominal hysterectomy    . Total hip arthroplasty      right  . Spine surgery      steel rod in back   Prior to Admission medications   Medication Sig Start Date End Date Taking? Authorizing Provider  amLODipine (NORVASC) 5 MG tablet Take 5 mg by mouth daily.    Historical Provider, MD  clopidogrel (PLAVIX) 75 MG tablet TAKE ONE TABLET EVERY DAY 12/26/13   Jackolyn Confer, MD  Cranberry 200 MG CAPS Take by mouth.    Historical Provider, MD  HYDROcodone-acetaminophen (NORCO/VICODIN) 5-325 MG  per tablet Take 1 tablet by mouth every 4 (four) hours as needed for moderate pain. 03/29/14   Jackolyn Confer, MD  levothyroxine (SYNTHROID, LEVOTHROID) 25 MCG tablet TAKE 1 TABLET (25 MCG TOTAL) BY MOUTH DAILY. 08/29/13   Jackolyn Confer, MD  meloxicam (MOBIC) 7.5 MG tablet TAKE ONE TABLET BY MOUTH EVERY DAY 02/28/14   Jackolyn Confer, MD  metoprolol succinate (TOPROL-XL) 25 MG 24 hr tablet Take by mouth. 02/27/14   Historical Provider, MD  pravastatin (PRAVACHOL) 40 MG tablet TAKE ONE TABLET EVERY EVENING 04/27/14   Jackolyn Confer, MD   Allergies  Allergen Reactions  . Ciprofloxacin   . Doxycycline   . Ferrous Sulfate Hives  . Levofloxacin   . Penicillins   . Sulfa Drugs Cross Reactors     FAMILY HISTORY:  No family history on file. SOCIAL HISTORY:  reports that she has never smoked. She has never used smokeless tobacco. She reports that she does not drink alcohol or use illicit drugs.  REVIEW OF SYSTEMS:  A 12-system ROS was conducted and, unless otherwise specified in the HPI, was negative.    SUBJECTIVE:   VITAL SIGNS: Temp:  [98.3 F (36.8 C)] 98.3 F (36.8 C) (04/15 2136) Pulse Rate:  [85] 85 (04/15 2136) Weight:  [111 lb 8.8 oz (50.6 kg)] 111 lb 8.8 oz (50.6 kg) (  04/15 2136) HEMODYNAMICS:   VENTILATOR SETTINGS:   INTAKE / OUTPUT: Intake/Output    None     PHYSICAL EXAMINATION: General:  Elderly F in NAD Neuro:  Grossly intact, see Krista Ellis note for details HEENT:  Sclera anicteric, conjunctiva pink, MMM, OP Clear Neck: Trachea supple and midline, (-) LAN or JVD Cardiovascular:  RRR, NS1/S2, 5/6 SEM loudest in the apex Lungs:  CTAB Abdomen:  S/NT/ND/(+)BS Musculoskeletal:  (-) C/C/E Skin:  Multiple echymoses on skin  LABS: Labs from [place] reviewed.   CBC No results for input(s): WBC, HGB, HCT, PLT in the last 168 hours. Coag's No results for input(s): APTT, INR in the last 168 hours. BMET No results for input(s): NA, K, CL, CO2, BUN, CREATININE,  GLUCOSE in the last 168 hours. Electrolytes No results for input(s): CALCIUM, MG, PHOS in the last 168 hours. Sepsis Markers No results for input(s): LATICACIDVEN, PROCALCITON, O2SATVEN in the last 168 hours. ABG No results for input(s): PHART, PCO2ART, PO2ART in the last 168 hours. Liver Enzymes No results for input(s): AST, ALT, ALKPHOS, BILITOT, ALBUMIN in the last 168 hours. Cardiac Enzymes No results for input(s): TROPONINI, PROBNP in the last 168 hours. Glucose No results for input(s): GLUCAP in the last 168 hours.  Imaging No results found.  EKG: Not obtained CXR: Not obtained  ASSESSMENT / PLAN:  Active Problems:   * No active hospital problems. *   PULMONARY A: No acute issues  CARDIOVASCULAR A: CAD:  PVD: CVA: HTN: HL: P:   Per NSH recs will hold Plavix and Asa Cont OP antihypertensives and antihyperlipidemics Echo to evaluate murmur Serial Trops Check EKG  RENAL A: No acute issues  GASTROINTESTINAL A: No acute issues  HEMATOLOGIC A: No acute issues  INFECTIOUS A: No acute issues  ENDOCRINE A: Hypothyroidism:  P:   Cont OP meds  NEUROLOGIC A: SDH: Likely 2/2 mechanical fall.  P:   CT in AM Follow-up [place] / DISPOSITION Level of Care:  ICU Primary Service:  PCCM Consultants:  [facility] Code Status:  DNR/DNI; confirmed with patient 4/15 Diet:  Heart Healthy DVT Px:  Holding OVernight, discuss with NSH in AM if patient stays GI Px:  Not indicated Skin Integrity:  Intact Social / Family:  Updated at bedside by AB on 4/15  TODAY'S SUMMARY:   I have personally obtained a history, examined the patient, evaluated laboratory and imaging results, formulated the assessment and plan and placed orders.  CRITICAL CARE: The patient is critically ill with multiple organ systems failure and requires high complexity decision making for assessment and support, frequent evaluation and titration of therapies, application of advanced  monitoring technologies and extensive interpretation of multiple databases. Critical Care Time devoted to patient care services described in this note is 45 minutes.   Margarette Asal, MD Pulmonary and [place] Pager: [phone]   05/05/2014, 10:37 PM

## 2014-05-05 NOTE — Consult Note (Signed)
Reason for Consult:SDH Referring Physician: CCM  Krista Ellis is an 81 y.o. female.  HPI: Patient is a 81 year old female that was in her kitchen earlier today went to put a hamburger patties in the microwave fell presumably struck her head she blacked out loss consciousness for an unknown period of time when patient awoke she was awake and alert she was taken [facility] [facility] was evaluated with CT scan showed a small subdural and patient has been transferred here for evaluation and observation. Currently the patient complains of her head hurting on either side where she struck it denies any new numbness or tingling in her arms or legs. Denies any nausea.  Past Medical History  Diagnosis Date  . Hypertension   . Hyperlipidemia   . Arthritis   . Hemihypertrophy   . Myocardial infarction   . Thyroid disease   . CVA (cerebral infarction) 2003  . Peripheral vascular disease     s/p stent right leg  . Pulmonary nodule     stable on Chest CT March 2014, Followed at [facility]    Past Surgical History  Procedure Laterality Date  . Abdominal hysterectomy    . Total hip arthroplasty      right  . Spine surgery      steel rod in back    No family history on file.  Social History:  reports that she has never smoked. She has never used smokeless tobacco. She reports that she does not drink alcohol or use illicit drugs.  Allergies:  Allergies  Allergen Reactions  . Ciprofloxacin   . Doxycycline   . Ferrous Sulfate Hives  . Levofloxacin   . Penicillins   . Sulfa Drugs Cross Reactors     Medications: I have reviewed the patient's current medications.  No results found for this or any previous visit (from the past 48 hour(s)).  No results found.  Review of Systems  Constitutional: Negative.   Eyes: Negative.   Respiratory: Negative.   Cardiovascular: Negative.   Musculoskeletal: Positive for myalgias, back pain, joint pain and neck pain.  Skin: Negative.   Neurological:  Positive for dizziness, tingling, sensory change and headaches.   Pulse 85, temperature 98.3 F (36.8 C), temperature source Oral, height 5\' 2"  (1.575 m), weight 50.6 kg (111 lb 8.8 oz). Physical Exam  Neurological: She has normal strength. GCS eye subscore is 4. GCS verbal subscore is 5. GCS motor subscore is 6.  Patient is awake alert oriented 4 strength is 5 out of 5 in her upper or lower extremity she does have decreased sensation due to her baseline neuropathy her pupils are equal extraocular movements are intact and cranial nerves are intact.    Assessment/Plan: 81 year old female with a very small right-sided frontotemporal subdural with no mass effect patient had been on Plavix I recommended holding the Plavix no antiplatelet agents for at least 72 hours then we can initiate aspirin and lever off the Plavix for a couple weeks. I recommend repeat CT of her head in the morning. If the CT is stable patient pelvic and be discharged home if cleared by critical care.  Tor Tsuda P 05/05/2014, 10:42 PM

## 2014-05-06 ENCOUNTER — Inpatient Hospital Stay (HOSPITAL_COMMUNITY): Payer: Medicare Other

## 2014-05-06 ENCOUNTER — Encounter (HOSPITAL_COMMUNITY): Payer: Self-pay | Admitting: *Deleted

## 2014-05-06 DIAGNOSIS — I6200 Nontraumatic subdural hemorrhage, unspecified: Secondary | ICD-10-CM

## 2014-05-06 DIAGNOSIS — Z8679 Personal history of other diseases of the circulatory system: Secondary | ICD-10-CM | POA: Insufficient documentation

## 2014-05-06 LAB — BASIC METABOLIC PANEL
ANION GAP: 8 (ref 5–15)
BUN: 13 mg/dL (ref 6–23)
CO2: 28 mmol/L (ref 19–32)
Calcium: 9.9 mg/dL (ref 8.4–10.5)
Chloride: 102 mmol/L (ref 96–112)
Creatinine, Ser: 0.69 mg/dL (ref 0.50–1.10)
GFR calc Af Amer: >90 mL/min (ref >90)
GFR, EST NON AFRICAN AMERICAN: 79 mL/min — AB (ref >90)
Glucose, Bld: 91 mg/dL (ref 70–99)
Potassium: 4.5 mmol/L (ref 3.5–5.1)
Sodium: 138 mmol/L (ref 135–145)

## 2014-05-06 LAB — MAGNESIUM: MAGNESIUM: 2.2 mg/dL (ref 1.5–2.5)

## 2014-05-06 LAB — CBC
HCT: 42 % (ref 36.0–46.0)
HEMOGLOBIN: 13.5 g/dL (ref 12.0–15.0)
MCH: 31.2 pg (ref 26.0–34.0)
MCHC: 32.1 g/dL (ref 30.0–36.0)
MCV: 97 fL (ref 78.0–100.0)
PLATELETS: 326 10*3/uL (ref 150–400)
RBC: 4.33 MIL/uL (ref 3.87–5.11)
RDW: 12.6 % (ref 11.5–15.5)
WBC: 5.6 10*3/uL (ref 4.0–10.5)

## 2014-05-06 LAB — TROPONIN I
Troponin I: 0.03 ng/mL (ref <0.031)
Troponin I: <0.03 ng/mL (ref <0.031)

## 2014-05-06 MED ORDER — ASPIRIN EC 81 MG PO TBEC: 81.0000 mg | DELAYED_RELEASE_TABLET | Freq: Every day | ORAL | Status: DC

## 2014-05-06 MED ORDER — CLOPIDOGREL BISULFATE 75 MG PO TABS: ORAL_TABLET | ORAL | Status: DC

## 2014-05-06 NOTE — Progress Notes (Signed)
Nutrition Brief Note  Patient identified on the Malnutrition Screening Tool (MST) Report Pt is s/p fall with right-side frontotemportal subdural. CT is pending.  Wt Readings from Last 15 Encounters:  05/06/14 111 lb 8.8 oz (50.6 kg)  03/09/14 115 lb 2 oz (52.22 kg)  01/03/14 115 lb (52.164 kg)  12/09/13 113 lb 12 oz (51.597 kg)  11/18/13 116 lb (52.617 kg)  08/31/13 121 lb 8 oz (55.112 kg)  06/01/13 119 lb (53.978 kg)  04/07/13 118 lb (53.524 kg)  02/21/13 121 lb (54.885 kg)  12/10/12 122 lb 8 oz (55.566 kg)  11/30/12 123 lb (55.792 kg)  09/06/12 124 lb (56.246 kg)  01/26/12 113 lb 8 oz (51.483 kg)  10/01/11 119 lb (53.978 kg)  08/29/11 118 lb 8 oz (53.751 kg)    Body mass index is 20.4 kg/(m^2). Patient meets criteria for normal range based on current BMI. Her weight has trended down over past year but is not clinically significant   She has no c/o poor appetite. Current diet order is heart healthy and po intake of breakfast 50% . Labs and medications reviewed. Pt has been cleared by Dr Saintclair Halsted for discharge if critical care is agreed.  No nutrition interventions warranted at this time. If nutrition issues arise, please consult RD.   Colman Cater MS,RD,CSG,LDN Office: [phone] Pager: [phone]

## 2014-05-06 NOTE — Discharge Summary (Signed)
Physician Discharge Summary       Patient ID: Krista Ellis MRN: [id number] DOB/AGE: [birth year] 79 y.o.  Admit date: 05/05/2014 Discharge date: 05/06/2014  Discharge Diagnoses:   Subdural Hematoma  Murmur  HTN   Detailed Hospital Course:   81 F with CAD s/p remote MI, PVD, CVA, HTN, HL who presented to [place] on 4/15 following a fall. She fell after her walker became tangled on a rug. She did not have chest pains or palpitations prior to falling. She has had some headaches recently which she attributes to a medication she was taking for UTI. She may have lost consciousness. She does not remember activating her medical alert system. One of her daughters was rapidly summoned and activated EMS. CT head showed right sided frontotemporal SDH. She was admitted to neuro-icu, neuro-surgery was consulted and she was monitored. F/U CT head was unchanged. She remained w/out neuro-change and felt ready for d/c as of 4/16   Discharge Plan by active problems  Right SDH Plan Hold plavix for 2 weeks Ok to resume aspirin   Murmur Plan Needs f/u echo by PCP  HTN Plan Cont home meds    Significant Hospital tests/ studies  Consults   CT head at discharge: Stable 4 mm subdural hematoma in the right frontal region.  Discharge Exam: BP 118/43 mmHg  Pulse 79  Temp(Src) 98.2 F (36.8 C) (Oral)  Resp 24  Ht 5\' 2"  (1.575 m)  Wt 50.6 kg (111 lb 8.8 oz)  BMI 20.40 kg/m2  SpO2 96%  General: Elderly F in NAD Neuro: Grossly intact HEENT: Sclera anicteric, conjunctiva pink, MMM, OP Clear Neck: Trachea supple and midline, (-) LAN or JVD Cardiovascular: RRR, NS1/S2, 5/6 SEM loudest in the apex Lungs: CTAB Abdomen: S/NT/ND/(+)BS Musculoskeletal: (-) C/C/E Skin: Multiple echymoses on skin   Labs at discharge Lab Results  Component Value Date   CREATININE 0.69 05/06/2014   BUN 13 05/06/2014   NA 138 05/06/2014   K 4.5 05/06/2014   CL 102 05/06/2014   CO2 28 05/06/2014   Lab  Results  Component Value Date   WBC 5.6 05/06/2014   HGB 13.5 05/06/2014   HCT 42.0 05/06/2014   MCV 97.0 05/06/2014   PLT 326 05/06/2014   Lab Results  Component Value Date   ALT 19 01/03/2014   AST 24 01/03/2014   ALKPHOS 44 01/03/2014   BILITOT 0.4 01/03/2014   No results found for: INR, PROTIME  Current radiology studies Ct Head Wo Contrast  05/06/2014   CLINICAL DATA:  Followup subdural hematoma.  EXAM: CT HEAD WITHOUT CONTRAST  TECHNIQUE: Contiguous axial images were obtained from the base of the skull through the vertex without intravenous contrast.  COMPARISON:  05/05/2014  FINDINGS: Skull and Sinuses:Mild right parietal scalp swelling.  No fracture.  Orbits: Bilateral cataract resection.  No traumatic findings  Brain: Stable 4 mm subdural hematoma around the right frontal operculum without mass effect. No new site of hemorrhage is detected.  Remote small vessel infarct involving the left corona radiata. Mild small vessel ischemic gliosis around the lateral ventricles. There is cerebral volume loss which is typical for age.  No evidence of acute infarct, mass lesion, or hydrocephalus.  IMPRESSION: Stable 4 mm subdural hematoma in the right frontal region.   Electronically Signed   By: Monte Fantasia M.D.   On: 05/06/2014 06:15    Disposition:home         Discharge Instructions    Diet - low sodium heart  healthy    Complete by:  As directed      Increase activity slowly    Complete by:  As directed             Medication List    STOP taking these medications        meloxicam 7.5 MG tablet  Commonly known as:  MOBIC      TAKE these medications        amLODipine 5 MG tablet  Commonly known as:  NORVASC  Take 5 mg by mouth daily.     aspirin EC 81 MG tablet  Take 1 tablet (81 mg total) by mouth daily.  Start taking on:  05/09/2014     clopidogrel 75 MG tablet  Commonly known as:  PLAVIX  Hold this for 2 weeks.     Cranberry 200 MG Caps  Take by mouth.      HYDROcodone-acetaminophen 5-325 MG per tablet  Commonly known as:  NORCO/VICODIN  Take 1 tablet by mouth every 4 (four) hours as needed for moderate pain.     levothyroxine 25 MCG tablet  Commonly known as:  SYNTHROID, LEVOTHROID  TAKE 1 TABLET (25 MCG TOTAL) BY MOUTH DAILY.     metoprolol succinate 25 MG 24 hr tablet  Commonly known as:  TOPROL-XL  Take by mouth.     pravastatin 40 MG tablet  Commonly known as:  PRAVACHOL  TAKE ONE TABLET EVERY EVENING       Follow-up Information    Follow up with [name], MD In 1 week.   Specialty:  Internal Medicine   Contact information:   [address] [place] [address] [phone]       Discharged Condition: good  Physician Statement:   The Patient was personally examined, the discharge assessment and plan has been personally reviewed and I agree with ACNP Hadrian Yarbrough's assessment and plan. > 30 minutes of time have been dedicated to discharge assessment, planning and discharge instructions.   Signed: Lindwood Mogel,PETE 05/06/2014, 12:34 PM

## 2014-05-06 NOTE — Discharge Instructions (Signed)
DO NOT TAKE YOUR PLAVIX FOR 2 WEEKS  You may take aspirin starting Tuesday 4/19 >until you resume aspirin >stop aspirin once back on plavix.

## 2014-05-06 NOTE — Progress Notes (Signed)
Subjective: Patient reports Doing well minimal headache  Objective: Vital signs in last 24 hours: Temp:  [98.3 F (36.8 C)-99.1 F (37.3 C)] 99 F (37.2 C) (04/16 0357) Pulse Rate:  [59-85] 59 (04/16 0600) Resp:  [16-25] 25 (04/16 0600) BP: (90-144)/(32-56) 144/43 mmHg (04/16 0600) SpO2:  [91 %-100 %] 100 % (04/16 0600) Weight:  [50.6 kg (111 lb 8.8 oz)] 50.6 kg (111 lb 8.8 oz) (04/15 2136)  Intake/Output from previous day: 04/15 0701 - 04/16 0700 In: -  Out: 100 [Urine:100] Intake/Output this shift: Total I/O In: -  Out: 100 [Urine:100]  Awake alert oriented neurologically at her baseline  Lab Results: No results for input(s): WBC, HGB, HCT, PLT in the last 72 hours. BMET No results for input(s): NA, K, CL, CO2, GLUCOSE, BUN, CREATININE, CALCIUM in the last 72 hours.  Studies/Results: No results found.  Assessment/Plan: Follow-up CT scan of her head stable with no worsening of her hemorrhage. It is okay from my perspective her to be discharged home. I would recommend she remain off of Plavix for 2 weeks it would be okay with me for her to start aspirin in 48 hours.  LOS: 1 day     Thierry Dobosz P 05/06/2014, 6:10 AM

## 2014-05-08 ENCOUNTER — Telehealth: Payer: Self-pay | Admitting: *Deleted

## 2014-05-08 DIAGNOSIS — I10 Essential (primary) hypertension: Secondary | ICD-10-CM | POA: Insufficient documentation

## 2014-05-08 NOTE — Telephone Encounter (Signed)
Pt and pt's daughter, Ronald Pippins, notified of appt,  verbalized understanding

## 2014-05-08 NOTE — Telephone Encounter (Signed)
1:00pm on Monday 69min

## 2014-05-08 NOTE — Telephone Encounter (Signed)
Note from front desk: pt needs a hosp f/u appt ( post fall / brain bleed) d/c Krista Ellis 05/06/14  PT can only come late afternoons . No appt in 7 daya Please advise where to schedule.   Where would you like her scheduled and I'll make call, thanks!

## 2014-05-08 NOTE — Telephone Encounter (Signed)
Discharge Date: 05/06/14  Transition Care Management Follow-up Telephone Call  How have you been since you were released from the hospital? Pt doing well, little energy, weak at times. Moving slowly.    Do you understand why you were in the hospital? YES, Subdermal hematoma s/p fall   Do you understand the discharge instructions? YES, see below.   Items Reviewed:  Medications reviewed: YES, pt advised to stop Plavix x 2 weeks. May take ASA 81 mg. Advised to stop Meloxicam.   Allergies reviewed: YES, no new allergies  Dietary changes reviewed: YES, low sodium heart healthy diet  Referrals reviewed: n/a   Functional Questionnaire:   Activities of Daily Living (ADLs):   States they are independent in the following: Pt has an aide that comes in daily and helps with ADLs. States they require assistance with the following: Bathing, cleaning   Any transportation issues/concerns?: NO, has a daughter that will being her to her appt.    Any patient concerns? Pt states she has difficulty swallowing at times, not a new concern, has been going on for a while. Advised to discuss with Dr. Gilford Rile at her appt.    Confirmed importance and date/time of follow-up visits scheduled: YES, appt with Dr. Gilford Rile 05/15/14 @ 1:00. Pt's daughter aware of appt.    Confirmed with patient if condition begins to worsen call PCP or go to the ER. Patient was given the Call-a-Nurse line [phone]: YES

## 2014-05-10 DIAGNOSIS — I739 Peripheral vascular disease, unspecified: Secondary | ICD-10-CM | POA: Diagnosis not present

## 2014-05-10 DIAGNOSIS — I38 Endocarditis, valve unspecified: Secondary | ICD-10-CM | POA: Diagnosis not present

## 2014-05-10 DIAGNOSIS — E782 Mixed hyperlipidemia: Secondary | ICD-10-CM | POA: Diagnosis not present

## 2014-05-10 DIAGNOSIS — Z87898 Personal history of other specified conditions: Secondary | ICD-10-CM | POA: Diagnosis not present

## 2014-05-10 DIAGNOSIS — I10 Essential (primary) hypertension: Secondary | ICD-10-CM | POA: Diagnosis not present

## 2014-05-12 NOTE — Op Note (Signed)
PATIENT NAME:  Krista Ellis, Krista Ellis MR#:  [id number] DATE OF BIRTH:  Mar 09, 1930  DATE OF PROCEDURE:  11/09/2012  PREOPERATIVE DIAGNOSIS: Bladder mass.   POSTOPERATIVE DIAGNOSIS: Bladder mass.   PROCEDURE: Cystoscopy and bladder biopsy.   SURGEON: Edrick Oh, MD.   ANESTHESIA: Laryngeal mask airway anesthesia.   INDICATIONS: The patient is an 81 year old female with a history of irritative voiding symptoms. Recent cystoscopy demonstrated an area of ulcer and inflammation on the right posterolateral wall. She was treated conservatively with persistence of the lesion. She presents for bladder biopsy for further evaluation.   DESCRIPTION OF PROCEDURE: After informed consent was obtained, the patient was taken to the operating room and placed in the dorsal lithotomy position under laryngeal mask airway anesthesia. The patient was then prepped and draped in the usual standard fashion. The 1- French rigid cystoscope was introduced into the urethra under direct vision with no urethral abnormalities noted. Upon entering the bladder, the mucosa was inspected in its entirety. There was a prominent area approximately 1.5 to 2 cm on the right posterior bladder wall just behind the right ureteral orifice demonstrating raised, irregular, erythematous mucosa with central ulcer. There does appear to be some slight improvement in the lesion compared to her previous evaluations. No other abnormalities were noted throughout the bladder. Bilateral ureteral orifices were easily identified with no lesions noted. They were noted to be somewhat pinpoint. Cold cup biopsy forceps were then utilized to obtain biopsies from the central aspect of the ulcerated section of the lesion. The remaining area was then extensively cauterized utilizing the Bugbee electrode. Upon completion, the bladder was drained. The cystoscope was removed. An 18-French red rubber catheter was inserted without difficulty. Lidocaine 40 mL, 2% was instilled  into the urinary bladder for topical anesthesia. The catheter was then removed. The patient was returned to the supine position and awakened from laryngeal mask airway anesthesia. She was taken to the recovery room in stable condition. There were no problems or complications. The patient tolerated the procedure well.   ESTIMATED BLOOD LOSS: Minimal.  PATHOLOGY SPECIMENS: Two biopsies from the right posterolateral bladder wall.  ____________________________ Denice Bors. Jacqlyn Larsen, MD bsc:aw D: 11/09/2012 08:12:30 ET T: 11/09/2012 08:39:28 ET JOB#: [id number]  cc: Denice Bors. Jacqlyn Larsen, MD, <Dictator> Denice Bors Lamoyne Palencia MD ELECTRONICALLY SIGNED 11/15/2012 16:06

## 2014-05-13 NOTE — Discharge Summary (Signed)
PATIENT NAME:  Krista Ellis, Krista Ellis MR#:  [id number] DATE OF BIRTH:  06-08-1930  DATE OF ADMISSION:  09/25/2013 DATE OF DISCHARGE:  09/28/2013  FINAL DIAGNOSES:  1.  Three right rib fractures.  2.  Weakness.  3.  Urinary tract infection.  4.  Hypertension.  5.  Hypothyroidism.  6.  Elevated troponin.   MEDICATIONS ON DISCHARGE INCLUDE: Amlodipine 10 mg daily, metoprolol succinate 50 mg twice a day, levothyroxine 25 mcg daily, pravastatin 40 mg at bedtime, Plavix 75 mg daily, vitamin B12 1000 mcg/mL injectable solution 1 mL injectable once a month, aspirin 81 mg daily, meloxicam 7.5 mg once a day at bedtime as needed, acetaminophen hydrocodone 325/5 mg 1 tablet every 6 hours as needed for moderate pain, MiraLax 17 g once a day.   DIET: Low sodium diet, regular consistency.   ACTIVITY: As tolerated with physical therapy.   FOLLOWUP:  In 1-2 days with the doctor at rehab.   HOSPITAL COURSE: The patient was admitted 09/25/2013 and discharged 09/28/2013. She came in with fall, rib fracture and pain, and elevated troponin. She was admitted because of rib fracture and pain for pain management. Urinary tract infection was also suspected, so IV Rocephin was given and urine culture was sent off. For the elevated troponin, no chest pain or shortness of breath. She is on aspirin, Plavix, and metoprolol.   LABORATORY AND RADIOLOGICAL DATA DURING THE HOSPITAL COURSE INCLUDED: A urine culture that grew out Escherichia coli, resistant to ampicillin but sensitive to ceftriaxone.  Urinalysis with 3+ leukocyte esterase, 3+ bacteria. First troponin borderline at 0.15. Lipase 146. Liver function tests normal range. White blood cell count 5.7, hemoglobin and hematocrit 12.5 and 38.5, platelet count 166,000. Glucose 97, BUN 26, creatinine 0.78, sodium 139, potassium 4.5, chloride 106, CO2 of 23, calcium 9.3. CT scan of the Cervical Spine: No acute findings, progressive spondylolysis of the cervical spine at C3-C4 and  C4-C5; no evidence of cervical fracture. CT Scan of the Head: No acute intracranial abnormality. Stable old left-sided basal ganglia, corona radii  infarct. CT scan of the Chest, Abdomen and Pelvis: Minimally displaced fractures involving the posterior aspect of the right tenth through twelfth ribs, no pneumothorax or pleural effusion, scattered bilateral pulmonary nodules unchanged since September  2014; followup examination ensures 2 years of stability, thus a benign etiology.  EKG: Normal sinus rhythm, no acute ST-T wave changes. CT scan of the Chest/PE Protocol: No evidence of pulmonary embolism. Next 2 troponins still borderline at 0.18. Hemoglobin upon discharge 11.6. Creatinine 0.69.   HOSPITAL COURSE PER PROBLEM LIST:  1.  For the patient's 3 rib fractures, the patient remains in a lot of pain. Norco prescribed. Pain should subside over the next 6 weeks, a little better each day.  2.  Weakness. Physical therapy saw the patient and recommended rehab. The patient does live alone.  3.  Urinary tract infection. This was treated fully in the hospital with IV Rocephin, 3 day course.  4.  Hypertension. Blood pressure stable during the hospital course on her usual medications.  5.  Hypothyroidism. Continue levothyroxine.  6.  Elevated troponin, likely secondary to demand ischemia and fall. No complaints of chest pain or shortness of breath. The patient is already on good cardiac medications with aspirin, Plavix, and metoprolol.   PHYSICAL EXAMINATION UPON DISCHARGE:  VITAL SIGNS: Temperature 97.4, pulse 65, respirations 18, blood pressure 153/57, pulse oximetry 94% on room air. No respiratory distress.  LUNGS: Clear to auscultation.  CARDIOVASCULAR: S1, S2  normal. No gallops, rubs, or murmurs heard. Carotid upstroke 2+ bilaterally. No bruits. Dorsalis pedis pulses 1+ bilaterally. Trace edema of the left lower extremity. Chronic lower extremity discoloration of bilateral lower extremities.  ABDOMEN:  Soft, nontender. No organomegaly or splenomegaly.  NEUROLOGIC: The patient is alert, oriented to person, place, and time.   TIME SPENT ON DISCHARGE: 35 minutes    ____________________________ Tana Conch. Leslye Peer, MD rjw:MT D: 09/28/2013 09:00:40 ET T: 09/28/2013 09:17:23 ET JOB#: [id number]  cc: Tana Conch. Leslye Peer, MD, <Dictator> [employer] Marisue Brooklyn MD ELECTRONICALLY SIGNED 10/04/2013 12:26

## 2014-05-13 NOTE — H&P (Signed)
PATIENT NAME:  Krista Ellis, Krista Ellis MR#:  [id number] DATE OF BIRTH:  [birth year]  DATE OF ADMISSION:  09/25/2013  PRIMARY CARE PHYSICIAN: Anderson Malta A. Gilford Rile, MD  EMERGENCY DEPARTMENT REFERRING PHYSICIAN: Yetta Numbers. Karma Greaser, MD  REASON FOR ADMISSION: Fall, rib fracture, pain, elevated troponin.   HISTORY OF PRESENT ILLNESS: The patient is an 81 year old white female with history of peripheral vascular disease, osteoarthritis, hypertension, hyperlipidemia, GERD, DJD, who has had difficulty with ambulation and has had multiple falls over the past few months. The patient on Friday again fell because her legs gave away and she hit the wall with her back. The patient started having sharp pain in her back, especially with taking deep breaths. The patient came to the ED and she was noted to have minimally displaced fractures involving the posterior aspect of the right tenth and twelfth rib in the location of her pain. She reports that every time she tries to move it hurts. The patient also, in the ED, was noted to have a troponin borderline of 0.15. She does report that she sees Dr. Nehemiah Massed and had a recent stress test 2 weeks ago, chemical stress test that was negative. She otherwise denies any fevers, chills. Denies any urinary frequency, urgency or hesitancy.   PAST MEDICAL HISTORY: Significant for allergic rhinitis, osteoarthritis, history of varicose veins, hypertension, hyperlipidemia, history of phlebitis, history of GI bleed, history of diverticulitis, history of GERD, DJD.   PAST SURGICAL HISTORY: Bilateral cataract surgery, right hip fractures repair, left knee repair, status post appendectomy, status post total hysterectomy, status post lumbar laminectomy.   ALLERGIES: CIPRO, LEVAQUIN, PENICILLIN AND SULFA DRUGS.   SOCIAL HISTORY: Does not smoke. Does not drink. Lives alone.   CURRENT MEDICATIONS AT HOME: She is on pravastatin 40 at bedtime, Plavix 75 mg half tablet daily, metoprolol succinate 50 one  tablet p.o. b.i.d., meloxicam 7.5 daily, levothyroxine 25 mcg daily, cyanocobalamin 1000 mcg once a day, aspirin 81 one tablet p.o. daily, amlodipine 10 daily.   FAMILY HISTORY: Positive for hypertension.   REVIEW OF SYSTEMS: CONSTITUTIONAL: Denies any fevers. Complains of fatigue, weakness, pain. No weight loss. No weight gain.  EYES: No blurred or double vision. No redness. No inflammation. History of cataracts.  EARS, NOSE AND THROAT: No tinnitus. No ear pain. No hearing loss. No seasonal or year-round allergies. No difficulty swallowing.  RESPIRATORY: Denies any cough, wheezing, hemoptysis. No COPD. No TB.  CARDIOVASCULAR: No chest pain. No orthopnea. No edema. No arrhythmia. No palpitation. No syncope. GASTROINTESTINAL: Denies any nausea, vomiting, diarrhea. No abdominal pain. No hematemesis. No melena.  GENITOURINARY: Denies any dysuria, hematuria, renal calculus or frequency.  ENDOCRINE: Denies any polyuria, nocturia. Does have hypothyroidism.  SKIN: Denies any acne or any rash. Does have bruising from her fall in the back.  MUSCULOSKELETAL: Has pain in her back at the site of the fall.  NEUROLOGIC: Denies any CVA, TIA or seizure.  PSYCHIATRIC: Denies any anxiety, insomnia or ADD.   PHYSICAL EXAMINATION:  VITAL SIGNS: Temperature 98.3, pulse 67, respirations 20, blood pressure 153/67, O2 of 97%.  GENERAL: The patient is a frail-looking female in no acute distress.  HEENT: Head atraumatic, normocephalic. Pupils equally round, reactive to light and accommodation. There is no conjunctival pallor. No scleral icterus. Nasal exam shows no drainage or ulceration. Oropharynx is clear without any exudate. External ear exam shows no erythema or drainage. Nasal exam shows no ulceration or drainage.  NECK: Supple without any JVD.  CARDIOVASCULAR: She has a systolic murmur.  LUNGS: Clear to auscultation bilaterally without any rales, rhonchi, wheezing.  ABDOMEN: Soft, nontender, nondistended.  Positive bowel sounds x 4. No hepatosplenomegaly.  EXTREMITIES: No clubbing, cyanosis or edema.  SKIN: No rash.  LYMPHATICS: No lymph nodes palpable.  MUSCULOSKELETAL: There is no erythema or swelling.  SKIN: She has bruising in her back.  PSYCHIATRIC: Not anxious or depressed.  NEUROLOGIC: Awake, alert, oriented x 3. No focal deficits.   LABORATORY DATA: Glucose 97, BUN 26, creatinine 0.78, sodium 139, potassium 4.5, chloride 106, CO2 is 27. LFTs are normal. Troponin 0.15. WBC 5.7, hemoglobin 12.5, platelet count 166,000. Urinalysis: Leukocytes 3+, WBCs 437, bacteria 3+.   ASSESSMENT AND PLAN: The patient is an 81 year old status post fall with back pain.  1.  Back pain due to rib fractures. At this time, we will control her pain. Will get PT evaluation, case manager evaluation and the patient may need further rehabilitation.  2.  Urinary tract infection. We will treat her with intravenous ceftriaxone. We will obtain urine cultures.  3.  Hypertension. Continue amlodipine and metoprolol.  4.  Hypothyroidism. Continue Synthroid.  5.  Elevated troponin, likely due to demand ischemia. At this time, we will continue her aspirin and Plavix. A recent stress test was negative. If her troponin starts trending higher, we will ask her cardiologist to see. 6.  Miscellaneous. The patient will be on Lovenox for deep veinous thrombosis prophylaxis.   TIME SPENT: Fifty-five minutes.    ____________________________ Lafonda Mosses Posey Pronto, MD shp:TT D: 09/25/2013 15:49:39 ET T: 09/25/2013 15:57:36 ET JOB#: [id number]  cc: Zaylin Runco H. Posey Pronto, MD, <Dictator> Alric Seton MD ELECTRONICALLY SIGNED 10/07/2013 8:38

## 2014-05-13 NOTE — Op Note (Signed)
PATIENT NAME:  Krista Ellis, Krista Ellis MR#:  [id number] DATE OF BIRTH:  01-12-1931  DATE OF PROCEDURE:  07/05/2013  PREOPERATIVE DIAGNOSIS: Atherosclerotic occlusive disease with critical stenosis within the right superficial femoral artery.   POSTOPERATIVE DIAGNOSIS:  Atherosclerotic occlusive disease with critical stenosis within the right superficial femoral artery.     PROCEDURE PERFORMED: 1.  Right lower extremity distal runoff, third order catheter placement.  2.  Percutaneous transluminal angioplasty and stent placement within the right popliteal and superficial femoral artery.   PROCEDURE PERFORMED BY: Katha Cabal, M.D.   SEDATION: Versed 4 mg plus fentanyl 125 mcg administered IV. Continuous ECG, pulse oximetry and cardiopulmonary monitoring is performed throughout the entire procedure by the interventional radiology nurse. Total sedation time was 1 hour 20 minutes.   ACCESS: A 6-French sheath left common femoral artery.   FLUOROSCOPY TIME: 4.9 minutes.   CONTRAST USED: Isovue 50 mL.   INDICATIONS: Krista Ellis is an 81 year old woman who underwent recanalization of her right lower extremity vasculature are for limb salvage in April. Followup angiography demonstrated a critical stenosis within the midportion of the previously-stented segment. Given her ischemia with occlusion, I discussed reintervention to prevent the lesion progressing to full occlusion and the possibility of limb loss. Risks and benefits were reviewed. All questions are answered. The patient has agreed to proceed with angiography and intervention.   DESCRIPTION OF PROCEDURE: The patient is taken to special procedures and placed in the supine position. After adequate sedation is achieved, she is positioned supine and her groins are prepped and draped in sterile fashion. Appropriate timeout is called.   Ultrasound is placed in a sterile sleeve and the common femoral artery on the left is identified. It is echolucent  and pulsatile, indicating patency. Image is recorded for the permanent record. Then 1% lidocaine is infiltrated in the soft tissues and access is obtained using a micropuncture needle under direct visualization. Microwire followed by micro sheath, J-wire followed by a 6-French sheath. Glidewire with a rim catheter is then used to cross the aortic bifurcation and negotiated down into the SFA. Hand injection of contrast through the rim catheter demonstrates a greater than 80% narrowing within the midportion of the previously-stented segment. Therefore, the wire is reintroduced and a rim catheter was exchanged for a 125 cm straight slip catheter. Wire and catheter combination negotiated through the stented segment, through the lesion and at the level of the femoral condyles distal runoff is obtained. This shows a secondary narrowing of approximately 70% at the distal margin of the stent extending approximately 1 cm into the stent and 1.5 cm distal into the mid popliteal.   A V-18 wire is then introduced through the straight slip catheter and initially a 4 x 100 Lutonix balloon is used to angioplasty from the level of the tibial plateau extending proximally. Inflation is to 8 atmospheres for 3 full minutes. Followup imaging demonstrates complete resolution of the aforementioned lesions distally in the mid popliteal as well as the distal margin of the stent. Within the mid to proximal portions of the previously-placed stent, at the point where 2 stents overlap, there is a markedly irregular lesion. Clearly this is the significant lesion that was suspected by ultrasound. It does appear to be greater than 90% in segments, it is quite irregular and it is elected to balloon this to 5 mm. Again, a little Lutonix balloon was used, 5 x 60, extending it several centimeters above the leading edge and subsequently a 6 x 50  Viabahn stent was deployed across this area and post dilated with a 6 mm balloon. Followup imaging  beginning at the mid SFA and extending down to the trifurcation now demonstrates there is complete resolution of all lesions. The stented segment now appears to be fully expanded and have excellent flow. Trifurcation is widely patent. Distal runoff is unchanged.  The sheath is pulled into the left external. Oblique view is obtained and StarClose device is deployed. There are no immediate complications.   INTERPRETATION: Initial views demonstrate the proximal half of the SFA is patent at the mid to distal 1/3 of the previously-placed stents, demonstrates several lesions as described above. The 1 in the area of the overlap is easily 90%, markedly irregular and extending over approximately 3.5 to 4 cm in length. Distally there is a smooth area of approximately 60% to 75% and 70% to 75% extending into the mid popliteal area. There is 3-vessel runoff to the foot. Following angioplasty to 4 mm, the popliteal is well treated. Following angioplasty and stent placement as described above, there is complete resolution of the irregular critical lesion.   SUMMARY: Successful recanalization of the right lower extremity as described above.   ____________________________ Katha Cabal, MD ggs:cs D: 07/05/2013 14:25:40 ET T: 07/05/2013 15:02:05 ET JOB#: [id number]  cc: Katha Cabal, MD, <Dictator> Anderson Malta A. Gilford Rile, MD Katha Cabal MD ELECTRONICALLY SIGNED 07/26/2013 15:18

## 2014-05-13 NOTE — Op Note (Signed)
PATIENT NAME:  CHRISTINA, GINTZ MR#:  [id number] DATE OF BIRTH:  1930-07-24  DATE OF PROCEDURE:  05/03/2013  PREOPERATIVE DIAGNOSIS: Atherosclerotic occlusive disease, bilateral lower extremities, with rest pain of the right lower extremity.   POSTOPERATIVE DIAGNOSIS: Atherosclerotic occlusive disease, bilateral lower extremities, with rest pain of the right lower extremity.   PROCEDURES PERFORMED:  1.  Abdominal aortogram.  2.  Right lower extremity distal runoff, third order catheter placement.  3.  Crosser atherectomy right superficial femoral and popliteal artery.  4.  Pathways thrombectomy, right superficial, femoral and popliteal arteries.  5.  Percutaneous transluminal angioplasty to 5 mm with Lutonix balloon, right superficial femoral and popliteal artery.   SURGEON: Hortencia Pilar, M.D.   SEDATION: Versed 5 mg plus fentanyl 200 mcg administered IV. Continuous ECG, pulse oximetry and cardiopulmonary monitoring is performed throughout the entire procedure by the interventional radiology nurse. Total sedation time is 1 hour, 30 minutes.   ACCESS: A 7-French sheath left common femoral artery.   FLUOROSCOPY TIME: 9.4 minutes.   CONTRAST USED: Isovue 105 mL.   INDICATIONS: Ms. Foronda is an 81 year old woman, who presented to the office with increasing pain in her right lower extremity. Noninvasive studies as well as physical exam demonstrated significant atherosclerotic occlusive disease and she is undergoing angiography with the hope for intervention for limb salvage. Risks and benefits were reviewed. All questions are answered. The patient agrees to proceed.   DESCRIPTION OF PROCEDURE: The patient is taken to special procedures and placed in the supine position. After adequate sedation is achieved, both groins are prepped and draped in sterile fashion. Ultrasound is placed in a sterile sleeve. Ultrasound is utilized secondary to lack of appropriate landmarks and to avoid vascular  injury. Under direct ultrasound visualization, common femoral artery is identified. It is echolucent and pulsatile indicating patency. Image is recorded for the permanent record. Under direct visualization, lidocaine is infiltrated in the soft tissues and then a micropuncture needle is inserted into the femoral artery, microwire followed by microsheath, J-wire followed by a 5-French sheath and 5-French pigtail catheter.   The pigtail catheter is positioned at the level of T12. AP projection of the aorta is obtained. Pigtail catheter is repositioned to above the bifurcation and an LAO projection of the pelvis is obtained. A stiff angled Glidewire and pigtail catheter are then used to cross the bifurcation. It is advanced down to the external iliac where an RAO projection of the groin is obtained and then the catheter wire combination is negotiated into the SFA. Distal runoff is then obtained from proximal SFA injection.   The patient is then given 5000 units of heparin. Stiff angled Glidewire is reintroduced The 5-French sheath is exchanged for a 7-French sheath, which is positioned with its tip in the common femoral artery and a straight catheter and Glidewire are negotiated down to the SFA occlusion. A 14S crosser atherectomy catheter with a 0.014 grand slam wire is then introduced. Sidekick catheter is also used to guide the crosser and the occlusion is then negotiated. The catheter wire is then advanced down to the mid popliteal where hand injection of contrast demonstrates distal runoff and intraluminal placement. A 0.014 wire is then introduced and the Pathways Jet Stream thrombectomy catheter is utilized to make 2 passes through the occluded previously stented segment. Followup imaging demonstrates that there is now patency, but significant residual stenosis. The Pathways device is removed and Lutonix balloons are utilized. A 5 x 10 followed by a 5 x 8  and then a 4 x 8 balloon working from distal to  proximal. All inflations are for 3 minutes to 12 atmospheres. Followup imaging now demonstrates that the SFA is widely patent. There is minimal residual stenosis noted and the trifurcation is maintained.   The sheath is pulled into the external iliac on the left. Oblique view is obtained and a StarClose device deployed. There are no immediate complications.   INTERPRETATION: The aorta, bilateral common iliacs and external iliacs are widely patent.   The right common femoral and profunda femoris are widely patent. Superficial femoral artery occludes at Hunter's canal in a previously stented segment. There is diffuse disease in the midportion. The mid popliteal artery reconstitutes and the trifurcation is patent, although the arteries are very small. They do appear to be patent to the ankle. Following crosser atherectomy, intraluminal gain is verified. Thrombectomy is then performed and subsequently angioplasty with excellent result.   SUMMARY: Successful limb salvage right lower extremity as described above.  ____________________________ Katha Cabal, MD ggs:aw D: 05/04/2013 09:48:23 ET T: 05/04/2013 10:03:36 ET JOB#: [id number]  cc: Katha Cabal, MD, <Dictator> Anderson Malta A. Gilford Rile, MD Katha Cabal MD ELECTRONICALLY SIGNED 05/10/2013 11:17

## 2014-05-14 NOTE — H&P (Signed)
PATIENT NAME:  Krista, Ellis MR#:  [id number] DATE OF BIRTH:  1930-11-21  DATE OF ADMISSION:  05/18/2011  REFERRING PHYSICIAN: Robb Matar, MD  FAMILY PHYSICIAN: Ronette Deter, MD  REASON FOR ADMISSION: Intractable nausea and vomiting with weakness, dehydration, and failure to thrive.   HISTORY OF PRESENT ILLNESS: The patient is a 81 year old female followed by Dr. Ronette Deter with a history of recurrent bladder infections/urinary tract infections currently on doxycycline. She has multiple drug allergies. She presents to the Emergency Room with recurrent urinary tract infection associated with intractable nausea, vomiting, diarrhea, weakness, and dehydration. She lives alone. She is so weak that she cannot care for herself. She is now admitted for further evaluation.   PAST MEDICAL HISTORY:  1. Recurrent urinary tract infections.  2. Hypothyroidism.  3. Allergic rhinitis.  4. Peptic ulcer disease.  5. Osteoarthritis.  6. Varicose veins with history of phlebitis.  7. Benign hypertension. 8. Hyperlipidemia.  9. History of diverticular bleed.  10. Degenerative disk disease, status post lumbar laminectomy.  11. Gastroesophageal reflux disease.  12. Status post cataract surgery.  13. Status post right hip surgery. 14. Status post bilateral knee surgery.  15. Status post appendectomy.  16. Status post hysterectomy.    MEDICATIONS:  1. Pravachol 40 mg p.o. daily.  2. Plavix 75 mg p.o. daily.  3. Toprol-XL 50 mg p.o. twice a day.  4. Mobic 7.5 mg p.o. daily.  5. Synthroid 0.025 mg p.o. daily.  6. Doxycycline 100 mg p.o. twice a day. 7. Catapres-TTS one topically every week.  8. Norvasc 10 mg p.o. daily.  9. Fosamax 70 mg p.o. every week.  10. Vicodin 5/500 mg one p.o. every six hours p.r.n. pain.   ALLERGIES: Cipro, Levaquin, penicillin, and sulfa.   SOCIAL HISTORY: The patient is widowed. No history of alcohol or tobacco abuse.   FAMILY HISTORY: Positive for  hypertension and coronary artery disease.   REVIEW OF SYSTEMS: CONSTITUTIONAL: No fever or change in weight. EYES: No blurred or double vision. No glaucoma. ENT: No tinnitus or hearing loss. No nasal discharge or bleeding. No difficulty swallowing. RESPIRATORY: No cough or wheezing. No hemoptysis or painful respirations. CARDIOVASCULAR: No chest pain or orthopnea. No palpitations or syncope. GI: As per history of present illness. GU: Some frequency but no dysuria or hematuria. No incontinence. ENDOCRINE: No polyuria or polydipsia. No heat or cold intolerance. HEMATOLOGIC: The patient denies anemia, easy bruising, or bleeding. LYMPHATIC: No swollen glands. MUSCULOSKELETAL: The patient denies pain in her neck, back, shoulders, knees, or hips. No gout. NEUROLOGIC: No numbness or migraines. Denies stroke or seizures. PSYCH: The patient denies anxiety, insomnia, or depression.  PHYSICAL EXAMINATION:   GENERAL: The patient is chronically ill-appearing but in no acute distress.   VITAL SIGNS: Vital signs are currently remarkable for a blood pressure of 145/73 with a heart rate of 74 and a respiratory rate of 20. She is afebrile.   HEENT: Normocephalic, atraumatic. Pupils are equally round and reactive to light and accommodation. Extraocular movements are intact. Sclerae are anicteric. Conjunctivae are clear. Oropharynx is dry but clear.  NECK: Supple without jugular venous distention or bruits. No adenopathy or thyromegaly is noted.   LUNGS: Clear to auscultation and percussion without wheezes, rales, or rhonchi. No dullness.   CARDIAC: Regular rate and rhythm with normal S1 and S2. There is a 3/6 systolic murmur noted. No rubs or gallops are present.   ABDOMEN: Soft but diffusely tender. No rebound or guarding. Normoactive bowel sounds. No  organomegaly or masses were appreciated. No hernias or bruits were noted.   EXTREMITIES: No clubbing, cyanosis, or edema. Pulses were 2+ bilaterally.   SKIN: Warm  and dry without rash or lesions.   NEUROLOGIC: Cranial nerves II through XII are grossly intact. Deep tendon reflexes were symmetric. Motor and sensory examination is nonfocal.   PSYCH: Exam revealed a patient who was alert and oriented to person, place, and time. She was cooperative and used good judgment.   LABS/STUDIES: CBC was remarkable for a white count of 24.4 with a hemoglobin of 12.2. Glucose was 104 with a BUN of 21 and a creatinine of 0.85 with a sodium of 137 and a potassium of 4.5.   Urinalysis revealed cloudy urine with 2+ leukocyte esterase and trace bacteria with 67 white cells per high-power field.   Chest x-ray was unremarkable.   EKG revealed sinus rhythm with no acute ischemic changes.   ASSESSMENT:  1. Dehydration.  2. Generalized weakness and fatigue with failure to thrive.  3. Recurrent urinary tract infections.  4. Intractable nausea and vomiting.  5. Cardiac murmur.  6. Benign hypertension.   PLAN: The patient will be admitted to the floor and started on IV fluids. We will give one dose of vancomycin and start p.o. Macrobid. We will send off urine culture and consult Dr. Jacqlyn Larsen who has followed the patient because of her recurrent urinary tract infections. We will hydrate with fluids and consult physical therapy. We will follow labs within 24 to 36 hours. The patient may require placement. We will obtain an echocardiogram because of her cardiac murmur. We will continue her outpatient regimen at this time. We will check a TSH. Further treatment and evaluation will depend upon the patient's progress. We will send off stool for C. difficile given her diarrhea.   TOTAL TIME SPENT ON THIS PATIENT: 50 minutes.  ____________________________ Leonie Douglas Doy Hutching, MD jds:slb D: 05/18/2011 00:58:37 ET T: 05/18/2011 09:43:59 ET JOB#: [id number]  cc: Leonie Douglas. Doy Hutching, MD, <Dictator> Eduard Clos. Gilford Rile, MD JEFFREY Lennice Sites MD ELECTRONICALLY SIGNED 05/18/2011 20:14

## 2014-05-14 NOTE — Discharge Summary (Signed)
PATIENT NAME:  Krista Ellis, Krista Ellis MR#:  [id number] DATE OF BIRTH:  08/24/1930  DATE OF ADMISSION:  05/18/2011 DATE OF DISCHARGE:  05/19/2011  For a detailed note, please take a look at the history and physical done on admission by Dr. Fulton Reek.   DIAGNOSES AT DISCHARGE:  1. Nausea, vomiting, and diarrhea likely secondary to urinary tract infection/possible viral illness, now resolved.  2. Hypertension.  3. Hyperlipidemia.  4. History of recurrent UTIs.  5. Osteoarthritis.  6. Hypothyroidism.  7. Leukocytosis.   DIET: The patient is being discharged on a low sodium diet.   ACTIVITY: As tolerated.   FOLLOW-UP: 1. Follow-up with Dr. Ronette Deter in the next 1 to 2 weeks.  2. Follow-up with Dr. Edrick Oh from Urology on 06/04/2011.   DISCHARGE MEDICATIONS:   1. Plavix 75 mg daily.  2. Amlodipine 10 mg daily.  3. Pravachol 40 mg daily. 4. Meloxicam 7.5 mg daily as needed.  5. Fosamax 70 mg weekly.  6. Clonidine patch 0.1 mg weekly.  7. Metoprolol succinate 50 mg b.i.d.  8. Tylenol with hydrocodone 1 tab b.i.d. as needed.  9. Synthroid 25 mcg daily.  10. Doxycycline 100 mg b.i.d. She only has two days left. 11. She will continue Macrobid 100 mg daily for urinary tract infection prevention.   PERTINENT STUDIES DONE DURING THE HOSPITAL COURSE: A urine culture showed 70,000 colonies of gram-negative rods. Sensitivity is still pending.   Chest x-ray done on admission showing no acute cardiopulmonary disease.    Ultrasound of the abdomen done showing cholelithiasis without sonographic evidence of acute cholecystitis, nonobstructive left renal calculus.   HOSPITAL COURSE: This is a 81 year old female with medical problems as mentioned above who presented to the hospital secondary to weakness, nausea, vomiting, and diarrhea.  1. Nausea, vomiting, diarrhea, and weakness. This was likely related to a urinary tract infection or possibly underlying viral illness. She was noted to  have leukocytosis, was recently being treated for urinary tract infection by Dr. Jacqlyn Larsen with p.o. doxycycline. The patient overnight in the hospital after getting some IV fluids has significantly improved. She has had no further episodes of nausea, vomiting, or diarrhea. She has been maintained on doxycycline. I did get the urine culture results from Dr. Aaron Edelman Cope's office which was collected on April 15th. It grew out 100,000 colonies of Escherichia coli and 50,000 colonies of Enterobacter pyrogenes. Both were sensitive to tetracycline. Therefore, she was told to continue her doxy and start her nitrofurantoin after she finishes doxy for urinary tract infection prevention. She will follow-up with Dr. Jacqlyn Larsen coming up in the next couple of weeks. She did not have any further diarrhea or any nausea or vomiting as mentioned.  2. Leukocytosis. This was likely secondary to the urinary tract infection. It has come down overnight from 24,000 to 12,000. She is afebrile and clinically stable, therefore being discharged home.  3. History of recurrent UTIs. As mentioned, the patient will finish her doxycycline course treatment for this urinary tract infection and start Macrobid after she finishes her treatment. She will follow-up with Dr. Jacqlyn Larsen and Dr. Ronette Deter as an outpatient.  4. Hypertension. The patient remained hemodynamically stable on her home meds including Norvasc, clonidine patch, and Toprol. She will resume that upon discharge.  5. Osteoporosis and osteoarthritis. The patient is on Mobic. She will resume that.  6. Hyperlipidemia. The patient was maintained on Pravachol. She will resume that upon discharge too.   CODE STATUS: The patient is a FULL  CODE.     TIME SPENT WITH THE DISCHARGE: 40 minutes.   ____________________________ Belia Heman. Verdell Carmine, MD vjs:drc D: 05/19/2011 15:26:02 ET T: 05/20/2011 10:33:53 ET JOB#: [id number]  cc: Belia Heman. Verdell Carmine, MD, <Dictator> Eduard Clos. Gilford Rile, MD Denice Bors  Jacqlyn Larsen, MD Henreitta Leber MD ELECTRONICALLY SIGNED 05/22/2011 8:15

## 2014-05-15 ENCOUNTER — Encounter: Payer: Self-pay | Admitting: Internal Medicine

## 2014-05-15 ENCOUNTER — Ambulatory Visit (INDEPENDENT_AMBULATORY_CARE_PROVIDER_SITE_OTHER): Payer: Medicare Other | Admitting: Internal Medicine

## 2014-05-15 VITALS — BP 132/60 | HR 64 | Temp 98.2°F | Resp 14 | Ht 62.0 in | Wt 112.2 lb

## 2014-05-15 DIAGNOSIS — M1990 Unspecified osteoarthritis, unspecified site: Secondary | ICD-10-CM

## 2014-05-15 DIAGNOSIS — I6200 Nontraumatic subdural hemorrhage, unspecified: Secondary | ICD-10-CM

## 2014-05-15 DIAGNOSIS — S065X9A Traumatic subdural hemorrhage with loss of consciousness of unspecified duration, initial encounter: Secondary | ICD-10-CM

## 2014-05-15 DIAGNOSIS — I10 Essential (primary) hypertension: Secondary | ICD-10-CM | POA: Diagnosis not present

## 2014-05-15 DIAGNOSIS — R269 Unspecified abnormalities of gait and mobility: Secondary | ICD-10-CM | POA: Diagnosis not present

## 2014-05-15 DIAGNOSIS — S065XAA Traumatic subdural hemorrhage with loss of consciousness status unknown, initial encounter: Secondary | ICD-10-CM

## 2014-05-15 DIAGNOSIS — E538 Deficiency of other specified B group vitamins: Secondary | ICD-10-CM

## 2014-05-15 LAB — CBC WITH DIFFERENTIAL/PLATELET
Basophils Absolute: 0 10*3/uL (ref 0.0–0.1)
Basophils Relative: 0.4 % (ref 0.0–3.0)
EOS ABS: 0.1 10*3/uL (ref 0.0–0.7)
Eosinophils Relative: 1.2 % (ref 0.0–5.0)
HEMATOCRIT: 40.6 % (ref 36.0–46.0)
Hemoglobin: 13.5 g/dL (ref 12.0–15.0)
LYMPHS ABS: 2.2 10*3/uL (ref 0.7–4.0)
Lymphocytes Relative: 36.5 % (ref 12.0–46.0)
MCHC: 33.3 g/dL (ref 30.0–36.0)
MCV: 95.3 fl (ref 78.0–100.0)
MONOS PCT: 10.8 % (ref 3.0–12.0)
Monocytes Absolute: 0.6 10*3/uL (ref 0.1–1.0)
NEUTROS ABS: 3 10*3/uL (ref 1.4–7.7)
NEUTROS PCT: 51.1 % (ref 43.0–77.0)
Platelets: 273 10*3/uL (ref 150.0–400.0)
RBC: 4.26 Mil/uL (ref 3.87–5.11)
RDW: 12.8 % (ref 11.5–15.5)
WBC: 5.9 10*3/uL (ref 4.0–10.5)

## 2014-05-15 LAB — COMPREHENSIVE METABOLIC PANEL
ALT: 8 U/L (ref 0–35)
AST: 18 U/L (ref 0–37)
Albumin: 3.6 g/dL (ref 3.5–5.2)
Alkaline Phosphatase: 46 U/L (ref 39–117)
BUN: 16 mg/dL (ref 6–23)
CALCIUM: 9.8 mg/dL (ref 8.4–10.5)
CHLORIDE: 104 meq/L (ref 96–112)
CO2: 27 mEq/L (ref 19–32)
Creatinine, Ser: 0.61 mg/dL (ref 0.40–1.20)
GFR: 99.82 mL/min (ref >60.00)
GLUCOSE: 105 mg/dL — AB (ref 70–99)
Potassium: 4.6 mEq/L (ref 3.5–5.1)
SODIUM: 134 meq/L — AB (ref 135–145)
Total Bilirubin: 0.5 mg/dL (ref 0.2–1.2)
Total Protein: 7.1 g/dL (ref 6.0–8.3)

## 2014-05-15 LAB — FERRITIN: Ferritin: 38.7 ng/mL (ref 10.0–291.0)

## 2014-05-15 MED ORDER — CYANOCOBALAMIN 1000 MCG/ML IJ SOLN
1000.0000 ug | Freq: Once | INTRAMUSCULAR | Status: AC
  Administered 2014-05-15: 1000 ug via INTRAMUSCULAR

## 2014-05-15 MED ORDER — HYDROCODONE-ACETAMINOPHEN 5-325 MG PO TABS: 1.0000 | ORAL_TABLET | Freq: Three times a day (TID) | ORAL | Status: DC | PRN

## 2014-05-15 NOTE — Progress Notes (Signed)
Subjective:    Patient ID: Krista Ellis, female    DOB: [birth year], 81 y.o.   MRN: [id number]  HPI  81YO female presents for hospital follow up.  Recently admitted on 4/15 for Subdural hematoma after a fall at home. Discharged: 05/06/2014  CT head showed stable 36mm SDH.  Feeling weak since arrival home. Some mild diffuse headaches. Wakes every morning with headache. Taking Hydrocodone in the morning for hip pain which helps with headache. Has also taken plain Tylenol with some improvement.  Recently had Holter monitor with Dr. Nehemiah Massed. ECHO was ordered but has not been completed.   Past medical, surgical, family and social history per today's encounter.  Review of Systems  Constitutional: Negative for fever, chills, appetite change, fatigue and unexpected weight change.  Eyes: Negative for visual disturbance.  Respiratory: Negative for shortness of breath.   Cardiovascular: Negative for chest pain and leg swelling.  Gastrointestinal: Negative for nausea, vomiting, abdominal pain, diarrhea and constipation.  Musculoskeletal: Positive for myalgias and arthralgias.  Skin: Negative for color change and rash.  Neurological: Positive for headaches.  Hematological: Negative for adenopathy. Does not bruise/bleed easily.  Psychiatric/Behavioral: Negative for dysphoric mood. The patient is not nervous/anxious.        Objective:    BP 132/60 mmHg  Pulse 64  Temp(Src) 98.2 F (36.8 C) (Oral)  Resp 14  Ht 5\' 2"  (1.575 m)  Wt 112 lb 4 oz (50.916 kg)  BMI 20.53 kg/m2  SpO2 95% Physical Exam  Constitutional: She is oriented to person, place, and time. She appears well-developed and well-nourished. No distress.  HENT:  Head: Normocephalic and atraumatic.  Right Ear: External ear normal.  Left Ear: External ear normal.  Nose: Nose normal.  Mouth/Throat: Oropharynx is clear and moist. No oropharyngeal exudate.  Eyes: Conjunctivae are normal. Pupils are equal, round, and  reactive to light. Right eye exhibits no discharge. Left eye exhibits no discharge. No scleral icterus.  Neck: Normal range of motion. Neck supple. No tracheal deviation present. No thyromegaly present.  Cardiovascular: Normal rate, regular rhythm, normal heart sounds and intact distal pulses.  Exam reveals no gallop and no friction rub.   No murmur heard. Pulmonary/Chest: Effort normal and breath sounds normal. No respiratory distress. She has no wheezes. She has no rales. She exhibits no tenderness.  Musculoskeletal: Normal range of motion. She exhibits no edema or tenderness.  Lymphadenopathy:    She has no cervical adenopathy.  Neurological: She is alert and oriented to person, place, and time. No cranial nerve deficit. She exhibits normal muscle tone. Coordination normal.  Skin: Skin is warm and dry. No rash noted. She is not diaphoretic. No erythema. No pallor.     Psychiatric: She has a normal mood and affect. Her behavior is normal. Judgment and thought content normal.          Assessment & Plan:   Problem List Items Addressed This Visit      Unprioritized   B12 deficiency    B12 injection today.      Relevant Medications   cyanocobalamin ((VITAMIN B-12)) injection 1,000 mcg (Completed)   Gait disturbance    Will set up PT for falls prevention.       Relevant Orders   Ambulatory referral to Physical Therapy   Hypertension    BP Readings from Last 3 Encounters:  05/15/14 132/60  05/06/14 118/43  03/09/14 154/63   BP well controlled. Continue current medications.      Osteoarthritis  Continue prn Hydrocodone for severe pain only for hip OA.      Relevant Medications   HYDROcodone-acetaminophen (NORCO/VICODIN) 5-325 MG per tablet   Subdural hematoma - Primary    Recent hospitalization after fall for SDH. Reviewed records from hospitalization including CT head. Will continue off Plavix until 4/30, then plan to start back at 1/2 dose (per pt, cardiology rec).  Results from ECHO, Holter, carotid doppler pending. Follow up recheck in 4 weeks. Will set up PT in the interim at home for falls prevention.      Relevant Orders   CBC with Differential/Platelet   Comprehensive metabolic panel   Ferritin       Return in about 4 weeks (around 06/12/2014) for Recheck.

## 2014-05-15 NOTE — Progress Notes (Signed)
Pre visit review using our clinic review tool, if applicable. No additional management support is needed unless otherwise documented below in the visit note. 

## 2014-05-15 NOTE — Assessment & Plan Note (Signed)
Will set up PT for falls prevention.

## 2014-05-15 NOTE — Assessment & Plan Note (Signed)
BP Readings from Last 3 Encounters:  05/15/14 132/60  05/06/14 118/43  03/09/14 154/63   BP well controlled. Continue current medications.

## 2014-05-15 NOTE — Assessment & Plan Note (Signed)
Recent hospitalization after fall for SDH. Reviewed records from hospitalization including CT head. Will continue off Plavix until 4/30, then plan to start back at 1/2 dose (per pt, cardiology rec). Results from ECHO, Holter, carotid doppler pending. Follow up recheck in 4 weeks. Will set up PT in the interim at home for falls prevention.

## 2014-05-15 NOTE — Patient Instructions (Signed)
Labs today.  We will call you to set up physical therapy.

## 2014-05-15 NOTE — Assessment & Plan Note (Signed)
Continue prn Hydrocodone for severe pain only for hip OA.

## 2014-05-15 NOTE — Assessment & Plan Note (Signed)
B12 injection today 

## 2014-05-16 ENCOUNTER — Encounter: Payer: Self-pay | Admitting: *Deleted

## 2014-05-17 DIAGNOSIS — N390 Urinary tract infection, site not specified: Secondary | ICD-10-CM | POA: Diagnosis not present

## 2014-05-18 ENCOUNTER — Inpatient Hospital Stay (HOSPITAL_COMMUNITY)
Admission: EM | Admit: 2014-05-18 | Discharge: 2014-05-24 | DRG: 069 | Disposition: A | Discharge status: Skilled Nursing Facility | Payer: Medicare Other | Attending: Internal Medicine | Admitting: Internal Medicine

## 2014-05-18 ENCOUNTER — Inpatient Hospital Stay (HOSPITAL_COMMUNITY): Payer: Medicare Other

## 2014-05-18 ENCOUNTER — Encounter (HOSPITAL_COMMUNITY): Payer: Self-pay | Admitting: Emergency Medicine

## 2014-05-18 ENCOUNTER — Emergency Department (HOSPITAL_COMMUNITY): Payer: Medicare Other

## 2014-05-18 DIAGNOSIS — R911 Solitary pulmonary nodule: Secondary | ICD-10-CM | POA: Diagnosis present

## 2014-05-18 DIAGNOSIS — G459 Transient cerebral ischemic attack, unspecified: Secondary | ICD-10-CM | POA: Diagnosis not present

## 2014-05-18 DIAGNOSIS — E785 Hyperlipidemia, unspecified: Secondary | ICD-10-CM | POA: Diagnosis not present

## 2014-05-18 DIAGNOSIS — Z7982 Long term (current) use of aspirin: Secondary | ICD-10-CM | POA: Diagnosis not present

## 2014-05-18 DIAGNOSIS — Z79899 Other long term (current) drug therapy: Secondary | ICD-10-CM

## 2014-05-18 DIAGNOSIS — M25559 Pain in unspecified hip: Secondary | ICD-10-CM | POA: Diagnosis not present

## 2014-05-18 DIAGNOSIS — J3089 Other allergic rhinitis: Secondary | ICD-10-CM | POA: Diagnosis not present

## 2014-05-18 DIAGNOSIS — Z9181 History of falling: Secondary | ICD-10-CM | POA: Diagnosis not present

## 2014-05-18 DIAGNOSIS — I69320 Aphasia following cerebral infarction: Secondary | ICD-10-CM | POA: Diagnosis not present

## 2014-05-18 DIAGNOSIS — Z8679 Personal history of other diseases of the circulatory system: Secondary | ICD-10-CM

## 2014-05-18 DIAGNOSIS — D649 Anemia, unspecified: Secondary | ICD-10-CM | POA: Diagnosis not present

## 2014-05-18 DIAGNOSIS — G4089 Other seizures: Secondary | ICD-10-CM | POA: Diagnosis present

## 2014-05-18 DIAGNOSIS — R296 Repeated falls: Secondary | ICD-10-CM

## 2014-05-18 DIAGNOSIS — S065X0D Traumatic subdural hemorrhage without loss of consciousness, subsequent encounter: Secondary | ICD-10-CM

## 2014-05-18 DIAGNOSIS — R29810 Facial weakness: Secondary | ICD-10-CM | POA: Diagnosis not present

## 2014-05-18 DIAGNOSIS — I739 Peripheral vascular disease, unspecified: Secondary | ICD-10-CM | POA: Diagnosis present

## 2014-05-18 DIAGNOSIS — R4781 Slurred speech: Secondary | ICD-10-CM | POA: Diagnosis not present

## 2014-05-18 DIAGNOSIS — I69328 Other speech and language deficits following cerebral infarction: Secondary | ICD-10-CM | POA: Diagnosis not present

## 2014-05-18 DIAGNOSIS — I10 Essential (primary) hypertension: Secondary | ICD-10-CM | POA: Diagnosis not present

## 2014-05-18 DIAGNOSIS — Z7902 Long term (current) use of antithrombotics/antiplatelets: Secondary | ICD-10-CM | POA: Diagnosis not present

## 2014-05-18 DIAGNOSIS — R269 Unspecified abnormalities of gait and mobility: Secondary | ICD-10-CM | POA: Diagnosis not present

## 2014-05-18 DIAGNOSIS — E039 Hypothyroidism, unspecified: Secondary | ICD-10-CM | POA: Diagnosis present

## 2014-05-18 DIAGNOSIS — Z66 Do not resuscitate: Secondary | ICD-10-CM | POA: Diagnosis present

## 2014-05-18 DIAGNOSIS — G458 Other transient cerebral ischemic attacks and related syndromes: Secondary | ICD-10-CM | POA: Diagnosis not present

## 2014-05-18 DIAGNOSIS — G8929 Other chronic pain: Secondary | ICD-10-CM | POA: Diagnosis not present

## 2014-05-18 DIAGNOSIS — G40909 Epilepsy, unspecified, not intractable, without status epilepticus: Secondary | ICD-10-CM | POA: Diagnosis not present

## 2014-05-18 DIAGNOSIS — N3001 Acute cystitis with hematuria: Secondary | ICD-10-CM | POA: Diagnosis not present

## 2014-05-18 DIAGNOSIS — R011 Cardiac murmur, unspecified: Secondary | ICD-10-CM | POA: Diagnosis present

## 2014-05-18 DIAGNOSIS — I771 Stricture of artery: Secondary | ICD-10-CM | POA: Diagnosis present

## 2014-05-18 DIAGNOSIS — R1312 Dysphagia, oropharyngeal phase: Secondary | ICD-10-CM | POA: Diagnosis not present

## 2014-05-18 DIAGNOSIS — Z96641 Presence of right artificial hip joint: Secondary | ICD-10-CM | POA: Diagnosis present

## 2014-05-18 DIAGNOSIS — R569 Unspecified convulsions: Secondary | ICD-10-CM | POA: Insufficient documentation

## 2014-05-18 DIAGNOSIS — M1990 Unspecified osteoarthritis, unspecified site: Secondary | ICD-10-CM | POA: Diagnosis present

## 2014-05-18 DIAGNOSIS — N390 Urinary tract infection, site not specified: Secondary | ICD-10-CM | POA: Diagnosis present

## 2014-05-18 DIAGNOSIS — R4701 Aphasia: Secondary | ICD-10-CM | POA: Diagnosis present

## 2014-05-18 DIAGNOSIS — S069X0D Unspecified intracranial injury without loss of consciousness, subsequent encounter: Secondary | ICD-10-CM | POA: Diagnosis not present

## 2014-05-18 DIAGNOSIS — R001 Bradycardia, unspecified: Secondary | ICD-10-CM | POA: Diagnosis not present

## 2014-05-18 DIAGNOSIS — I69391 Dysphagia following cerebral infarction: Secondary | ICD-10-CM | POA: Diagnosis not present

## 2014-05-18 DIAGNOSIS — I252 Old myocardial infarction: Secondary | ICD-10-CM | POA: Diagnosis not present

## 2014-05-18 DIAGNOSIS — E440 Moderate protein-calorie malnutrition: Secondary | ICD-10-CM | POA: Insufficient documentation

## 2014-05-18 LAB — COMPREHENSIVE METABOLIC PANEL
ALT: 12 U/L (ref 0–35)
AST: 24 U/L (ref 0–37)
Albumin: 3.4 g/dL — ABNORMAL LOW (ref 3.5–5.2)
Alkaline Phosphatase: 44 U/L (ref 39–117)
Anion gap: 7 (ref 5–15)
BUN: 18 mg/dL (ref 6–23)
CHLORIDE: 104 mmol/L (ref 96–112)
CO2: 26 mmol/L (ref 19–32)
Calcium: 9.7 mg/dL (ref 8.4–10.5)
Creatinine, Ser: 0.79 mg/dL (ref 0.50–1.10)
GFR calc Af Amer: 88 mL/min — ABNORMAL LOW (ref >90)
GFR, EST NON AFRICAN AMERICAN: 76 mL/min — AB (ref >90)
GLUCOSE: 107 mg/dL — AB (ref 70–99)
POTASSIUM: 4.9 mmol/L (ref 3.5–5.1)
Sodium: 137 mmol/L (ref 135–145)
Total Bilirubin: 0.7 mg/dL (ref 0.3–1.2)
Total Protein: 7 g/dL (ref 6.0–8.3)

## 2014-05-18 LAB — CBC
HCT: 40.3 % (ref 36.0–46.0)
HEMOGLOBIN: 12.7 g/dL (ref 12.0–15.0)
MCH: 31.1 pg (ref 26.0–34.0)
MCHC: 31.5 g/dL (ref 30.0–36.0)
MCV: 98.5 fL (ref 78.0–100.0)
Platelets: 217 10*3/uL (ref 150–400)
RBC: 4.09 MIL/uL (ref 3.87–5.11)
RDW: 12.6 % (ref 11.5–15.5)
WBC: 5.6 10*3/uL (ref 4.0–10.5)

## 2014-05-18 LAB — RAPID URINE DRUG SCREEN, HOSP PERFORMED
AMPHETAMINES: NOT DETECTED
BARBITURATES: NOT DETECTED
BENZODIAZEPINES: NOT DETECTED
Cocaine: NOT DETECTED
Opiates: POSITIVE — AB
TETRAHYDROCANNABINOL: NOT DETECTED

## 2014-05-18 LAB — I-STAT CHEM 8, ED
BUN: 24 mg/dL — AB (ref 6–23)
CALCIUM ION: 1.22 mmol/L (ref 1.13–1.30)
CHLORIDE: 103 mmol/L (ref 96–112)
CREATININE: 0.8 mg/dL (ref 0.50–1.10)
Glucose, Bld: 108 mg/dL — ABNORMAL HIGH (ref 70–99)
HCT: 44 % (ref 36.0–46.0)
Hemoglobin: 15 g/dL (ref 12.0–15.0)
Potassium: 4.9 mmol/L (ref 3.5–5.1)
Sodium: 137 mmol/L (ref 135–145)
TCO2: 23 mmol/L (ref 0–100)

## 2014-05-18 LAB — URINE MICROSCOPIC-ADD ON

## 2014-05-18 LAB — URINALYSIS, ROUTINE W REFLEX MICROSCOPIC
Bilirubin Urine: NEGATIVE
Glucose, UA: NEGATIVE mg/dL
KETONES UR: NEGATIVE mg/dL
NITRITE: NEGATIVE
Protein, ur: NEGATIVE mg/dL
Specific Gravity, Urine: 1.011 (ref 1.005–1.030)
Urobilinogen, UA: 1 mg/dL (ref 0.0–1.0)
pH: 6.5 (ref 5.0–8.0)

## 2014-05-18 LAB — DIFFERENTIAL
Basophils Absolute: 0 10*3/uL (ref 0.0–0.1)
Basophils Relative: 0 % (ref 0–1)
EOS PCT: 1 % (ref 0–5)
Eosinophils Absolute: 0.1 10*3/uL (ref 0.0–0.7)
LYMPHS PCT: 41 % (ref 12–46)
Lymphs Abs: 2.3 10*3/uL (ref 0.7–4.0)
Monocytes Absolute: 0.6 10*3/uL (ref 0.1–1.0)
Monocytes Relative: 10 % (ref 3–12)
NEUTROS PCT: 48 % (ref 43–77)
Neutro Abs: 2.6 10*3/uL (ref 1.7–7.7)

## 2014-05-18 LAB — PROTIME-INR
INR: 0.98 (ref 0.00–1.49)
Prothrombin Time: 13.1 seconds (ref 11.6–15.2)

## 2014-05-18 LAB — APTT: APTT: 32 s (ref 24–37)

## 2014-05-18 LAB — I-STAT TROPONIN, ED: TROPONIN I, POC: 0 ng/mL (ref 0.00–0.08)

## 2014-05-18 MED ORDER — HYDROCODONE-ACETAMINOPHEN 5-325 MG PO TABS
1.0000 | ORAL_TABLET | Freq: Three times a day (TID) | ORAL | Status: DC | PRN
  Administered 2014-05-20 – 2014-05-24 (×10): 1 via ORAL
  Filled 2014-05-18 (×10): qty 1

## 2014-05-18 MED ORDER — ACETAMINOPHEN 650 MG RE SUPP
650.0000 mg | RECTAL | Status: DC | PRN
  Administered 2014-05-18 – 2014-05-20 (×3): 650 mg via RECTAL
  Filled 2014-05-18 (×3): qty 1

## 2014-05-18 MED ORDER — SENNOSIDES-DOCUSATE SODIUM 8.6-50 MG PO TABS: 1.0000 | ORAL_TABLET | Freq: Every evening | ORAL | Status: DC | PRN

## 2014-05-18 MED ORDER — LEVOTHYROXINE SODIUM 25 MCG PO TABS
25.0000 ug | ORAL_TABLET | Freq: Every day | ORAL | Status: DC
  Administered 2014-05-19 – 2014-05-24 (×6): 25 ug via ORAL
  Filled 2014-05-18 (×6): qty 1

## 2014-05-18 MED ORDER — PREGABALIN 25 MG PO CAPS
25.0000 mg | ORAL_CAPSULE | Freq: Two times a day (BID) | ORAL | Status: DC
Start: 2014-05-18 — End: 2014-05-24
  Administered 2014-05-19 – 2014-05-24 (×11): 25 mg via ORAL
  Filled 2014-05-18 (×11): qty 1

## 2014-05-18 MED ORDER — METOPROLOL SUCCINATE ER 25 MG PO TB24
12.5000 mg | ORAL_TABLET | Freq: Every day | ORAL | Status: DC
  Administered 2014-05-19 – 2014-05-23 (×5): 12.5 mg via ORAL
  Filled 2014-05-18 (×7): qty 1

## 2014-05-18 MED ORDER — STROKE: EARLY STAGES OF RECOVERY BOOK
Freq: Once | Status: AC
  Administered 2014-05-18: 1

## 2014-05-18 MED ORDER — PRAVASTATIN SODIUM 40 MG PO TABS
40.0000 mg | ORAL_TABLET | Freq: Every evening | ORAL | Status: DC
  Administered 2014-05-19 – 2014-05-23 (×5): 40 mg via ORAL
  Filled 2014-05-18 (×6): qty 1

## 2014-05-18 MED ORDER — AMLODIPINE BESYLATE 5 MG PO TABS
5.0000 mg | ORAL_TABLET | Freq: Every day | ORAL | Status: DC
  Administered 2014-05-19 – 2014-05-23 (×5): 5 mg via ORAL
  Filled 2014-05-18 (×6): qty 1

## 2014-05-18 NOTE — Progress Notes (Signed)
Attempted to call for report, ED RN said she will call back.

## 2014-05-18 NOTE — Progress Notes (Signed)
Pt arrived to 4N13 via stretcher.  Pt and daughter welcomed and settled into the room.  VSS and telemetry applied.  Pt NPO due to failed swallow.  Pt c/o headache 4/10.   Cori Razor, RN

## 2014-05-18 NOTE — ED Notes (Signed)
Pt in MRI at present

## 2014-05-18 NOTE — ED Provider Notes (Signed)
CSN: [id number]     Arrival date & time 05/18/14  1326 History   First MD Initiated Contact with Patient 05/18/14 1357     Chief Complaint  Patient presents with  . Aphasia  . Dysphagia     (Consider location/radiation/quality/duration/timing/severity/associated sxs/prior Treatment) HPI Comments: Patient presents with aphasia. She states that she was admitted to [facility] on April 15 after a fall that resulted in a subdural hematoma. She states over the last week she's had some problems with her speech where intermittently she has difficulty getting her words out. She also notes that at times she feels like the right corner mouth droops and she has pulling out of the right corner of her mouth. She noted that the symptoms were worse this morning and longer lasting. This made her come to the emergency department today. She states that she feels like she's back to her baseline now. She has been waking up frequently with headaches since she was in the hospital with a subdural hematoma. She denies any change in her balance or gait. She denies any numbness or weakness to her extremities. She denies any vision changes. She denies any worsening dizziness. She denies any recent illnesses.   Past Medical History  Diagnosis Date  . Hypertension   . Hyperlipidemia   . Arthritis   . Hemihypertrophy   . Myocardial infarction   . Thyroid disease   . CVA (cerebral infarction) 2003  . Peripheral vascular disease     s/p stent right leg  . Pulmonary nodule     stable on Chest CT March 2014, Followed at [facility]  . Murmur, cardiac    Past Surgical History  Procedure Laterality Date  . Abdominal hysterectomy    . Total hip arthroplasty      right  . Spine surgery      steel rod in back   History reviewed. No pertinent family history. History  Substance Use Topics  . Smoking status: Never Smoker   . Smokeless tobacco: Never Used  . Alcohol Use: No   OB History    No data available      Review of Systems  Constitutional: Positive for fatigue. Negative for fever, chills and diaphoresis.  HENT: Negative for congestion, rhinorrhea and sneezing.   Eyes: Negative.   Respiratory: Negative for cough, chest tightness and shortness of breath.   Cardiovascular: Negative for chest pain and leg swelling.  Gastrointestinal: Negative for nausea, vomiting, abdominal pain, diarrhea and blood in stool.  Genitourinary: Negative for frequency, hematuria, flank pain and difficulty urinating.  Musculoskeletal: Negative for back pain and arthralgias.  Skin: Negative for rash.  Neurological: Positive for facial asymmetry and speech difficulty. Negative for dizziness, weakness, numbness and headaches.      Allergies  Ciprofloxacin; Doxycycline; Ferrous sulfate; Levofloxacin; Penicillins; and Sulfa drugs cross reactors  Home Medications   Prior to Admission medications   Medication Sig Start Date End Date Taking? Authorizing Provider  amLODipine (NORVASC) 5 MG tablet Take 5 mg by mouth daily.   Yes Historical Provider, MD  aspirin EC 81 MG tablet Take 1 tablet (81 mg total) by mouth daily. 05/09/14  Yes Erick Colace, NP  cyanocobalamin (,VITAMIN B-12,) 1000 MCG/ML injection Inject 1,000 mcg into the muscle every 30 (thirty) days.   Yes Historical Provider, MD  HYDROcodone-acetaminophen (NORCO/VICODIN) 5-325 MG per tablet Take 1 tablet by mouth 3 (three) times daily as needed for moderate pain. 05/15/14  Yes Jackolyn Confer, MD  levothyroxine (  SYNTHROID, LEVOTHROID) 25 MCG tablet TAKE 1 TABLET (25 MCG TOTAL) BY MOUTH DAILY. 08/29/13  Yes Jackolyn Confer, MD  metoprolol succinate (TOPROL-XL) 25 MG 24 hr tablet Take 12.5 mg by mouth daily.  02/27/14  Yes Historical Provider, MD  pravastatin (PRAVACHOL) 40 MG tablet TAKE ONE TABLET EVERY EVENING 04/27/14  Yes Jackolyn Confer, MD  pregabalin (LYRICA) 25 MG capsule Take 25 mg by mouth 2 (two) times daily. 03/06/14  Yes Historical Provider, MD   clopidogrel (PLAVIX) 75 MG tablet Hold this for 2 weeks. Patient not taking: Reported on 05/15/2014 05/06/14   Erick Colace, NP  fosfomycin (MONUROL) 3 G PACK Take 3 g by mouth as needed (for UTI).    Historical Provider, MD   BP 134/50 mmHg  Pulse 65  Temp(Src) 98 F (36.7 C) (Oral)  Resp 15  SpO2 99% Physical Exam  Constitutional: She is oriented to person, place, and time. She appears well-developed and well-nourished.  HENT:  Head: Normocephalic and atraumatic.  Eyes: Pupils are equal, round, and reactive to light.  Neck: Normal range of motion. Neck supple.  Cardiovascular: Normal rate and regular rhythm.   Murmur heard. Pulmonary/Chest: Effort normal and breath sounds normal. No respiratory distress. She has no wheezes. She has no rales. She exhibits no tenderness.  Abdominal: Soft. Bowel sounds are normal. There is no tenderness. There is no rebound and no guarding.  Musculoskeletal: Normal range of motion. She exhibits no edema.  Lymphadenopathy:    She has no cervical adenopathy.  Neurological: She is alert and oriented to person, place, and time. She has normal strength. No cranial nerve deficit or sensory deficit. GCS eye subscore is 4. GCS verbal subscore is 5. GCS motor subscore is 6.  Finger-nose intact, no pronator drift  Skin: Skin is warm and dry. No rash noted.  Psychiatric: She has a normal mood and affect.    ED Course  Procedures (including critical care time) Labs Review Labs Reviewed  COMPREHENSIVE METABOLIC PANEL - Abnormal; Notable for the following:    Glucose, Bld 107 (*)    Albumin 3.4 (*)    GFR calc non Af Amer 76 (*)    GFR calc Af Amer 88 (*)    All other components within normal limits  I-STAT CHEM 8, ED - Abnormal; Notable for the following:    BUN 24 (*)    Glucose, Bld 108 (*)    All other components within normal limits  PROTIME-INR  APTT  CBC  DIFFERENTIAL  URINE RAPID DRUG SCREEN (HOSP PERFORMED)  URINALYSIS, ROUTINE W REFLEX  MICROSCOPIC  I-STAT TROPOININ, ED  I-STAT TROPOININ, ED    Imaging Review Ct Head Wo Contrast  05/18/2014   CLINICAL DATA:  Aphasia and dysphagia.  Followup subdural hematoma.  EXAM: CT HEAD WITHOUT CONTRAST  TECHNIQUE: Contiguous axial images were obtained from the base of the skull through the vertex without intravenous contrast.  COMPARISON:  CT head 05/06/2014  FINDINGS: Resolving right-sided subdural hematoma. This measures approximately 4 mm in thickness and has become less dense compared with the prior study. No new area of hemorrhage  Generalized atrophy. Chronic ischemic changes throughout the white matter and centrum semiovale on the left unchanged. No acute infarct. Negative for mass lesion. No shift of the midline structures.  Arterial calcification.  Negative for skull fracture  IMPRESSION: Expected evolutionary change in right-sided subdural hematoma which is 4 mm in thickness with progression to lower density fluid.  Atrophy and chronic microvascular  ischemia. No superimposed acute abnormality.   Electronically Signed   By: Franchot Gallo M.D.   On: 05/18/2014 14:58     EKG Interpretation   Date/Time:  Thursday May 18 2014 14:05:39 EDT Ventricular Rate:  66 PR Interval:  173 QRS Duration: 74 QT Interval:  391 QTC Calculation: 410 R Axis:   34 Text Interpretation:  Sinus rhythm since last tracing no significant  change Confirmed by Kaisa Wofford  MD, Rashan Patient ([id number]) on 05/18/2014 2:44:53 PM      MDM   Final diagnoses:  Transient cerebral ischemia, unspecified transient cerebral ischemia type    Patient presents with intermittent aphasia and drooling out of the right side of her mouth. She's back to baseline now. She has been off of her Plavix due to her subdural hematoma. There is a concern that she's having TIAs. I spoke with Dr. Nicole Kindred who will see the patient. I also spoke with Danton Clap with the hospitalist service she will admit the patient. Her head CT doesn't show any acute  abnormalities.    Malvin Johns, MD 05/18/14 1534

## 2014-05-18 NOTE — ED Notes (Signed)
Patient transported to CT 

## 2014-05-18 NOTE — ED Notes (Signed)
Pt had recent admission for head bleed after fall and now having some slurred speech and dysphagia; pt unable to handle secretions

## 2014-05-18 NOTE — H&P (Signed)
[facility] History and Physical                                                                                    Krista Ellis, is a 81 y.o. female  MRN: [id number]   DOB - May 05, 1930  Admit Date - 05/18/2014  Outpatient Primary MD for the patient is Krista Mast, MD  ER Referring MD: Dr. Tamera Punt  With History of -  Past Medical History  Diagnosis Date  . Hypertension   . Hyperlipidemia   . Arthritis   . Hemihypertrophy   . Myocardial infarction   . Thyroid disease   . CVA (cerebral infarction) 2003  . Peripheral vascular disease     s/p stent right leg  . Pulmonary nodule     stable on Chest CT March 2014, Followed at [facility]  . Murmur, cardiac       Past Surgical History  Procedure Laterality Date  . Abdominal hysterectomy    . Total hip arthroplasty      right  . Spine surgery      steel rod in back    in for   Chief Complaint  Patient presents with  . Aphasia  . Dysphagia     HPI This is a 81 year old female patient recently discharged on or/16/16 after an admission for a right sided subdural hematoma, presumed traumatic in etiology. Prior to admission patient had been on Plavix and this medicine was placed on hold last resume on 4/30. Patient returned to the ER today with complaints of intermittent episodes of aphasia with associated inability to control oral secretions. These episodes have been occurring with no particular pattern of the past seven days. Duration of episodes between 1 and 5 minutes. 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 It is noted that the symptoms are different. In addition patient has been reporting significant right-sided supraorbital headache. At her bedside the daughter clarified that patient actually had been having the right supraorbital headache several days before she  fell and was diagnosed with subdural hematoma. Prior to the fall, the patient's physician thought that a new medication for UTI may have been causing the headache so that medication was discontinued. Patient has only been using plain Tylenol for the headache and reports utilizing her chronic hydrocodone once daily for her chronic hip pain. In regards to current symptoms patient has been having difficulty finding the correct words and her speech sounds like "marbles in her mouth". She's had no other associated neurological signs or symptoms such as visual changes, facial drooping, numbness or tingling or weakness in any of the extremities. She does report saliva collecting in her mouth which she is unable to control until the spell resolves. Her daughter at the bedside also noticed that the patient has had poor oral intake for at least one week. When questioned about rationale for Plavix patient does not recall the exact diagnosis this was prescribed for and her daughter reports that they think the Plavix was started around 1991.  In the ER  patient was afebrile and hemodynamically stable maintaining O2 saturations of 98% on room air. CT of the head without contrast revealed expected evolutionary change in the right-sided subdural hematoma which is 4 mm in thickness with progression to a lower density fluid. There was atrophy and chronic microvascular ischemia without any superimposed acute abnormality. Neurological consultation is pending. EKG was unremarkable and demonstrates sinus rhythm only. Laboratory data was unremarkable except for BUN of 24 were previously had been doing 1318, and mildly elevated glucose of 108. Troponin was negative. Hemoglobin was also slightly elevated at 15 compared to 12.7 at time of discharge. Coags were normal.   Review of Systems   In addition to the HPI above,  No Fever-chills, myalgias or other constitutional symptoms No changes with Vision or hearing, new weakness,  tingling, numbness in any extremity, No problems swallowing food or Liquids, indigestion/reflux No Chest pain, Cough or Shortness of Breath, palpitations, orthopnea or DOE No Abdominal pain, N/V; no melena or hematochezia, no dark tarry stools, Bowel movements are regular, No dysuria, hematuria or flank pain No new skin rashes, lesions, masses or bruises, No new joints pains-aches No recent weight gain or loss No polyuria, polydypsia or polyphagia,  *A full 10 point Review of Systems was done, except as stated above, all other Review of Systems were negative.  Social History History  Substance Use Topics  . Smoking status: Never Smoker   . Smokeless tobacco: Never Used  . Alcohol Use: No    Resides at: At home with her daughter checking on her  Lives with: Alone  Ambulatory status: Ambulatory   Family History History reviewed. Family history not pertinent to current admitting diagnosis   Prior to Admission medications   Medication Sig Start Date End Date Taking? Authorizing Provider  amLODipine (NORVASC) 5 MG tablet Take 5 mg by mouth daily.   Yes Historical Provider, MD  aspirin EC 81 MG tablet Take 1 tablet (81 mg total) by mouth daily. 05/09/14  Yes Erick Colace, NP  cyanocobalamin (,VITAMIN B-12,) 1000 MCG/ML injection Inject 1,000 mcg into the muscle every 30 (thirty) days.   Yes Historical Provider, MD  HYDROcodone-acetaminophen (NORCO/VICODIN) 5-325 MG per tablet Take 1 tablet by mouth 3 (three) times daily as needed for moderate pain. 05/15/14  Yes Jackolyn Confer, MD  levothyroxine (SYNTHROID, LEVOTHROID) 25 MCG tablet TAKE 1 TABLET (25 MCG TOTAL) BY MOUTH DAILY. 08/29/13  Yes Jackolyn Confer, MD  metoprolol succinate (TOPROL-XL) 25 MG 24 hr tablet Take 12.5 mg by mouth daily.  02/27/14  Yes Historical Provider, MD  pravastatin (PRAVACHOL) 40 MG tablet TAKE ONE TABLET EVERY EVENING 04/27/14  Yes Jackolyn Confer, MD  pregabalin (LYRICA) 25 MG capsule Take 25 mg by  mouth 2 (two) times daily. 03/06/14  Yes Historical Provider, MD  clopidogrel (PLAVIX) 75 MG tablet Hold this for 2 weeks. Patient not taking: Reported on 05/15/2014 05/06/14   Erick Colace, NP  fosfomycin (MONUROL) 3 G PACK Take 3 g by mouth as needed (for UTI).    Historical Provider, MD    Allergies  Allergen Reactions  . Ciprofloxacin Hives, Itching and Rash  . Doxycycline Hives, Itching and Rash  . Ferrous Sulfate Hives, Itching and Rash  . Levofloxacin Hives, Itching and Rash  . Penicillins Hives, Itching and Rash  . Sulfa Drugs Cross Reactors Hives, Itching and Rash    Physical Exam  Vitals  Blood pressure 134/50, pulse 65, temperature 98 F (36.7 C), temperature source Oral,  resp. rate 15, SpO2 99 %.   General:  In no acute distress, appears stated age  Psych:  Normal affect, Denies Suicidal or Homicidal ideations, Awake Alert, Oriented X 3. Speech and thought patterns are clear and appropriate, no apparent short term memory deficits  Neuro:   No focal neurological deficits, CN II through XII intact, Strength 5/5 all 4 extremities, Sensation intact all 4 extremities.  ENT:  Ears and Eyes appear Normal, Conjunctivae clear, PER. Dry oral mucosa without erythema or exudates.  Neck:  Supple, No lymphadenopathy appreciated  Respiratory:  Symmetrical chest wall movement, Good air movement bilaterally, CTAB. Room Air  Cardiac:  RRR, marked mid systolic murmur left sternal border fifth intercostal space grade 4/5, no LE edema noted, no JVD, No carotid bruits, peripheral pulses palpable at 2+  Abdomen:  Positive bowel sounds, Soft, Non tender, Non distended,  No masses appreciated, no obvious hepatosplenomegaly  Skin:  No Cyanosis, Normal Skin Turgor, No Skin Rash or Bruise.  Extremities: Symmetrical without obvious trauma or injury,  no effusions.  Data Review  CBC  Recent Labs Lab 05/15/14 1344 05/18/14 1412 05/18/14 1424  WBC 5.9 5.6  --   HGB 13.5 12.7 15.0    HCT 40.6 40.3 44.0  PLT 273.0 217  --   MCV 95.3 98.5  --   MCH  --  31.1  --   MCHC 33.3 31.5  --   RDW 12.8 12.6  --   LYMPHSABS 2.2 2.3  --   MONOABS 0.6 0.6  --   EOSABS 0.1 0.1  --   BASOSABS 0.0 0.0  --     Chemistries   Recent Labs Lab 05/15/14 1344 05/18/14 1412 05/18/14 1424  NA 134* 137 137  K 4.6 4.9 4.9  CL 104 104 103  CO2 27 26  --   GLUCOSE 105* 107* 108*  BUN 16 18 24*  CREATININE 0.61 0.79 0.80  CALCIUM 9.8 9.7  --   AST 18 24  --   ALT 8 12  --   ALKPHOS 46 44  --   BILITOT 0.5 0.7  --     estimated creatinine clearance is 43.6 mL/min (by C-G formula based on Cr of 0.8).  No results for input(s): TSH, T4TOTAL, T3FREE, THYROIDAB in the last 72 hours.  Invalid input(s): FREET3  Coagulation profile  Recent Labs Lab 05/18/14 1412  INR 0.98    No results for input(s): DDIMER in the last 72 hours.  Cardiac Enzymes No results for input(s): CKMB, TROPONINI, MYOGLOBIN in the last 168 hours.  Invalid input(s): CK  Invalid input(s): POCBNP  Urinalysis No results found for: COLORURINE, APPEARANCEUR, LABSPEC, PHURINE, GLUCOSEU, HGBUR, BILIRUBINUR, KETONESUR, PROTEINUR, UROBILINOGEN, NITRITE, LEUKOCYTESUR  Imaging results:   Ct Head Wo Contrast  05/18/2014   CLINICAL DATA:  Aphasia and dysphagia.  Followup subdural hematoma.  EXAM: CT HEAD WITHOUT CONTRAST  TECHNIQUE: Contiguous axial images were obtained from the base of the skull through the vertex without intravenous contrast.  COMPARISON:  CT head 05/06/2014  FINDINGS: Resolving right-sided subdural hematoma. This measures approximately 4 mm in thickness and has become less dense compared with the prior study. No new area of hemorrhage  Generalized atrophy. Chronic ischemic changes throughout the white matter and centrum semiovale on the left unchanged. No acute infarct. Negative for mass lesion. No shift of the midline structures.  Arterial calcification.  Negative for skull fracture   IMPRESSION: Expected evolutionary change in right-sided subdural hematoma which is 4  mm in thickness with progression to lower density fluid.  Atrophy and chronic microvascular ischemia. No superimposed acute abnormality.   Electronically Signed   By: Franchot Gallo M.D.   On: 05/18/2014 14:58   Ct Head Wo Contrast  05/06/2014   CLINICAL DATA:  Followup subdural hematoma.  EXAM: CT HEAD WITHOUT CONTRAST  TECHNIQUE: Contiguous axial images were obtained from the base of the skull through the vertex without intravenous contrast.  COMPARISON:  05/05/2014  FINDINGS: Skull and Sinuses:Mild right parietal scalp swelling.  No fracture.  Orbits: Bilateral cataract resection.  No traumatic findings  Brain: Stable 4 mm subdural hematoma around the right frontal operculum without mass effect. No new site of hemorrhage is detected.  Remote small vessel infarct involving the left corona radiata. Mild small vessel ischemic gliosis around the lateral ventricles. There is cerebral volume loss which is typical for age.  No evidence of acute infarct, mass lesion, or hydrocephalus.  IMPRESSION: Stable 4 mm subdural hematoma in the right frontal region.   Electronically Signed   By: Monte Fantasia M.D.   On: 05/06/2014 06:15     EKG: (Independently reviewed) on his rhythm without any acute ischemic changes   Assessment & Plan  Principal Problem:   TIA  -Admit to telemetry -Await formal neurological consultation -Follow up on MRI/MRA brain -Check 2-D echocardiogram and carotid duplex -No antiplatelet given recent subdural hematoma and recommendations to not resume Plavix until 4/30 -Check hemoglobin A1c and lipid panel -PT/OT/speech evaluation -Nothing by mouth until passes bedside swallow evaluation  Active Problems:   Recurrent falls/known gait disturbance -PT evaluation as above    History of subdural hematoma -Based on CT scan subdural has remained stable -Patient's daughter describes patient having  right supraorbital headache prior to falling -possibility patient may have sustained a spontaneous subdural hematoma as opposed to a traumatic subdural hematoma-unable to determine at this juncture-continue to monitor regarding resumption of Plavix or other antiplatelet agents    Hypertension -Blood pressure currently well controlled -Continue preadmission Norvasc and Toprol    Hypothyroidism -Continue Synthroid    Osteoarthritis -Continue hydrocodone and Lyrica    Systolic murmur -Echocardiogram had been planned as an outpatient to further evaluate but this will be completed during the hospitalization as part of the protocol for TIA evaluation    Dyslipidemia -Continue statin   DVT Prophylaxis: SCDs  Family Communication:   Daughter at bedside  Code Status:  DO NOT RESUSCITATE  Condition:  Stable  Discharge disposition: Anticipate discharge back to home with assistance of family; may require short-term home health services such as PT/OT  Time spent in minutes : 60   Sue Fernicola L. ANP on 05/18/2014 at 4:05 PM  Between 7am to 7pm - Pager - [phone]  After 7pm go to [email or web address] - password [email or web address]  And look for the night coverage person covering me after hours  [facility]

## 2014-05-18 NOTE — Consult Note (Signed)
Referring Physician: Tamera Punt    Chief Complaint: TIA  HPI:                                                                                                                                         JERILYN GILLASPIE is an 81 y.o. 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  Patient was with her family member today when she was noted to have right facial droop, difficulty expressing herself and per family, sounded as her tongue was thick. Per family this only lasted for a few minutes and then fully resolved. Currently she is back to her baseline.   Date last known well: Date: 05/18/2014 Time last known well: Time: 13:00 tPA Given: No: symptoms resolved Modified Rankin: Rankin Score=3    Past Medical History  Diagnosis Date  . Hypertension   . Hyperlipidemia   . Arthritis   . Hemihypertrophy   . Myocardial infarction   . Thyroid disease   . CVA (cerebral infarction) 2003  . Peripheral vascular disease     s/p stent right leg  . Pulmonary nodule     stable on Chest CT March 2014, Followed at [facility]  . Murmur, cardiac     Past Surgical History  Procedure Laterality Date  . Abdominal hysterectomy    . Total hip arthroplasty      right  . Spine surgery      steel rod in back    History reviewed. No pertinent family history. Social History:  reports that she has never smoked. She has never used smokeless tobacco. She reports that she does not drink alcohol or use illicit drugs.  Allergies:  Allergies  Allergen Reactions  . Ciprofloxacin Hives, Itching and Rash  . Doxycycline Hives, Itching and Rash  . Ferrous Sulfate Hives, Itching and Rash  . Levofloxacin Hives, Itching and Rash  . Penicillins Hives, Itching and Rash  . Sulfa Drugs Cross Reactors Hives, Itching and Rash    Medications:                                                                                                                           No current facility-administered  medications for this encounter.   Current Outpatient Prescriptions  Medication Sig Dispense Refill  . amLODipine (NORVASC) 5 MG tablet Take 5 mg by  mouth daily.    Marland Kitchen aspirin EC 81 MG tablet Take 1 tablet (81 mg total) by mouth daily.    . cyanocobalamin (,VITAMIN B-12,) 1000 MCG/ML injection Inject 1,000 mcg into the muscle every 30 (thirty) days.    Marland Kitchen HYDROcodone-acetaminophen (NORCO/VICODIN) 5-325 MG per tablet Take 1 tablet by mouth 3 (three) times daily as needed for moderate pain. 90 tablet 0  . levothyroxine (SYNTHROID, LEVOTHROID) 25 MCG tablet TAKE 1 TABLET (25 MCG TOTAL) BY MOUTH DAILY. 90 tablet 3  . metoprolol succinate (TOPROL-XL) 25 MG 24 hr tablet Take 12.5 mg by mouth daily.     . pravastatin (PRAVACHOL) 40 MG tablet TAKE ONE TABLET EVERY EVENING 90 tablet 1  . pregabalin (LYRICA) 25 MG capsule Take 25 mg by mouth 2 (two) times daily.    . clopidogrel (PLAVIX) 75 MG tablet Hold this for 2 weeks. (Patient not taking: Reported on 05/15/2014) 90 tablet 1  . fosfomycin (MONUROL) 3 G PACK Take 3 g by mouth as needed (for UTI).       ROS:                                                                                                                                       History obtained from the patient and family member  General ROS: negative for - chills, fatigue, fever, night sweats, weight gain or weight loss Psychological ROS: negative for - behavioral disorder, hallucinations, memory difficulties, mood swings or suicidal ideation Ophthalmic ROS: negative for - blurry vision, double vision, eye pain or loss of vision ENT ROS: negative for - epistaxis, nasal discharge, oral lesions, sore throat, tinnitus or vertigo Allergy and Immunology ROS: negative for - hives or itchy/watery eyes Hematological and Lymphatic ROS: negative for - bleeding problems, bruising or swollen lymph nodes Endocrine ROS: negative for - galactorrhea, hair pattern changes, polydipsia/polyuria or  temperature intolerance Respiratory ROS: negative for - cough, hemoptysis, shortness of breath or wheezing Cardiovascular ROS: negative for - chest pain, dyspnea on exertion, edema or irregular heartbeat Gastrointestinal ROS: negative for - abdominal pain, diarrhea, hematemesis, nausea/vomiting or stool incontinence Genito-Urinary ROS: negative for - dysuria, hematuria, incontinence or urinary frequency/urgency Musculoskeletal ROS: negative for - joint swelling or muscular weakness Neurological ROS: as noted in HPI Dermatological ROS: negative for rash and skin lesion changes  Neurologic Examination:  Blood pressure 134/50, pulse 65, temperature 98 F (36.7 C), temperature source Oral, resp. rate 15, SpO2 99 %.  HEENT-  Normocephalic, no lesions, without obvious abnormality.  Normal external eye and conjunctiva.  Normal TM's bilaterally.  Normal auditory canals and external ears. Normal external nose, mucus membranes and septum.  Normal pharynx. Cardiovascular- S1, S2 normal, pulses palpable throughout   Lungs- chest clear, no wheezing, rales, normal symmetric air entry Abdomen- normal findings: bowel sounds normal Extremities- no edema Lymph-no adenopathy palpable Musculoskeletal-no joint tenderness, deformity or swelling Skin-warm and dry, no hyperpigmentation, vitiligo, or suspicious lesions  Neurological Examination Mental Status: Alert, oriented, thought content appropriate.  Speech fluent without evidence of aphasia.  Able to follow 3 step commands without difficulty. Cranial Nerves: II: Discs flat bilaterally; Visual fields grossly normal, pupils equal, round, reactive to light and accommodation III,IV, VI: ptosis not present, extra-ocular motions intact bilaterally V,VII: smile symmetric, facial light touch sensation normal bilaterally VIII: hearing normal bilaterally IX,X: uvula  rises symmetrically XI: bilateral shoulder shrug XII: midline tongue extension Motor: Significant arthritis and bilateral RTC tear Right : Upper extremity   4/5    Left:     Upper extremity   4/5  Lower extremity   4/5     Lower extremity   4/5 --positive hoffman's bilaterally Tone and bulk:normal tone throughout; no atrophy noted Sensory: Pinprick and light touch intact throughout, bilaterally Deep Tendon Reflexes: 2+ and symmetric throughout Plantars: Up going Cerebellar: normal finger-to-nose,  normal heel-to-shin test Gait: no tested due to multiple leads.        Lab Results: Basic Metabolic Panel:  Recent Labs Lab 05/15/14 1344 05/18/14 1412 05/18/14 1424  NA 134* 137 137  K 4.6 4.9 4.9  CL 104 104 103  CO2 27 26  --   GLUCOSE 105* 107* 108*  BUN 16 18 24*  CREATININE 0.61 0.79 0.80  CALCIUM 9.8 9.7  --     Liver Function Tests:  Recent Labs Lab 05/15/14 1344 05/18/14 1412  AST 18 24  ALT 8 12  ALKPHOS 46 44  BILITOT 0.5 0.7  PROT 7.1 7.0  ALBUMIN 3.6 3.4*   No results for input(s): LIPASE, AMYLASE in the last 168 hours. No results for input(s): AMMONIA in the last 168 hours.  CBC:  Recent Labs Lab 05/15/14 1344 05/18/14 1412 05/18/14 1424  WBC 5.9 5.6  --   NEUTROABS 3.0 2.6  --   HGB 13.5 12.7 15.0  HCT 40.6 40.3 44.0  MCV 95.3 98.5  --   PLT 273.0 217  --     Cardiac Enzymes: No results for input(s): CKTOTAL, CKMB, CKMBINDEX, TROPONINI in the last 168 hours.  Lipid Panel: No results for input(s): CHOL, TRIG, HDL, CHOLHDL, VLDL, LDLCALC in the last 168 hours.  CBG: No results for input(s): GLUCAP in the last 168 hours.  Microbiology: Results for orders placed or performed during the hospital encounter of 05/05/14  MRSA PCR Screening     Status: None   Collection Time: 05/05/14  9:30 PM  Result Value Ref Range Status   MRSA by PCR NEGATIVE NEGATIVE Final    Comment:        The GeneXpert MRSA Assay (FDA approved for NASAL  specimens only), is one component of a comprehensive MRSA colonization surveillance program. It is not intended to diagnose MRSA infection nor to guide or monitor treatment for MRSA infections.     Coagulation Studies:  Recent Labs  05/18/14 1412  LABPROT 13.1  INR 0.98    Imaging: Ct Head Wo Contrast  05/18/2014   CLINICAL DATA:  Aphasia and dysphagia.  Followup subdural hematoma.  EXAM: CT HEAD WITHOUT CONTRAST  TECHNIQUE: Contiguous axial images were obtained from the base of the skull through the vertex without intravenous contrast.  COMPARISON:  CT head 05/06/2014  FINDINGS: Resolving right-sided subdural hematoma. This measures approximately 4 mm in thickness and has become less dense compared with the prior study. No new area of hemorrhage  Generalized atrophy. Chronic ischemic changes throughout the white matter and centrum semiovale on the left unchanged. No acute infarct. Negative for mass lesion. No shift of the midline structures.  Arterial calcification.  Negative for skull fracture  IMPRESSION: Expected evolutionary change in right-sided subdural hematoma which is 4 mm in thickness with progression to lower density fluid.  Atrophy and chronic microvascular ischemia. No superimposed acute abnormality.   Electronically Signed   By: Franchot Gallo M.D.   On: 05/18/2014 14:58       Assessment and plan discussed with with attending physician and they are in agreement.    Noela Brothers PA-C [facility] [phone]  05/18/2014, 3:56 PM   Assessment: 81 y.o. female presenting with transient expressive aphasia which has fully cleared at this time. Exam shows no focal or lateralizing symptoms.  Ct head was negative for acute stroke and showed resolving right SDH.  Given her transient speech issues cannot rule out TIA.   Stroke Risk Factors - hyperlipidemia and hypertension  1. HgbA1c, fasting lipid panel 2. MRI, MRA  of the brain without contrast 3. PT consult,  OT consult, Speech consult 4. Echocardiogram 5. Carotid dopplers 6. Prophylactic therapy-Antiplatelet med: Aspirin - dose 81 mg daily 7. Risk factor modification 8. Telemetry monitoring 9. Frequent neuro checks 10 NPO until passes stroke swallow screen  I personally participated in this patient's evaluation and management, including formulating the above clinical impression and management recommendations.  Rush Farmer M.D. [facility] [phone]

## 2014-05-18 NOTE — ED Notes (Signed)
Pt c/o intermittent aphasia and dysphagia. At present, pt has no neuro deficits. Per daughter, pt has been having a hard time getting her words out and words are garbled. Family also reports pt has been getting choked when eating or drinking fluids more easily than previous admission. Also has been having drooling to the right side of mouth. Pt wearing brief which was saturated in urine. Pt recently treated for UTI

## 2014-05-19 ENCOUNTER — Inpatient Hospital Stay (HOSPITAL_COMMUNITY): Payer: Medicare Other

## 2014-05-19 DIAGNOSIS — G458 Other transient cerebral ischemic attacks and related syndromes: Secondary | ICD-10-CM

## 2014-05-19 DIAGNOSIS — R569 Unspecified convulsions: Secondary | ICD-10-CM | POA: Insufficient documentation

## 2014-05-19 DIAGNOSIS — R269 Unspecified abnormalities of gait and mobility: Secondary | ICD-10-CM

## 2014-05-19 DIAGNOSIS — G459 Transient cerebral ischemic attack, unspecified: Secondary | ICD-10-CM

## 2014-05-19 DIAGNOSIS — N390 Urinary tract infection, site not specified: Secondary | ICD-10-CM | POA: Diagnosis present

## 2014-05-19 DIAGNOSIS — R001 Bradycardia, unspecified: Secondary | ICD-10-CM | POA: Diagnosis not present

## 2014-05-19 LAB — LIPID PANEL
Cholesterol: 127 mg/dL (ref 0–200)
HDL: 36 mg/dL — AB (ref >39)
LDL CALC: 77 mg/dL (ref 0–99)
Total CHOL/HDL Ratio: 3.5 RATIO
Triglycerides: 69 mg/dL (ref <150)
VLDL: 14 mg/dL (ref 0–40)

## 2014-05-19 MED ORDER — ASPIRIN EC 81 MG PO TBEC
81.0000 mg | DELAYED_RELEASE_TABLET | Freq: Once | ORAL | Status: AC
  Administered 2014-05-19: 81 mg via ORAL
  Filled 2014-05-19: qty 1

## 2014-05-19 MED ORDER — ENSURE ENLIVE PO LIQD
237.0000 mL | Freq: Two times a day (BID) | ORAL | Status: DC
  Administered 2014-05-20 – 2014-05-24 (×6): 237 mL via ORAL
  Filled 2014-05-19: qty 237

## 2014-05-19 MED ORDER — LEVETIRACETAM 500 MG/5ML IV SOLN
500.0000 mg | Freq: Two times a day (BID) | INTRAVENOUS | Status: DC
  Administered 2014-05-19 – 2014-05-20 (×2): 500 mg via INTRAVENOUS
  Filled 2014-05-19 (×3): qty 5

## 2014-05-19 MED ORDER — SODIUM CHLORIDE 0.9 % IV SOLN
INTRAVENOUS | Status: DC
  Administered 2014-05-19: 18:00:00 via INTRAVENOUS
  Administered 2014-05-19: 100 mL/h via INTRAVENOUS
  Administered 2014-05-21 – 2014-05-22 (×5): via INTRAVENOUS

## 2014-05-19 MED ORDER — LEVETIRACETAM 500 MG PO TABS: 500.0000 mg | ORAL_TABLET | Freq: Two times a day (BID) | ORAL | Status: DC

## 2014-05-19 MED ORDER — CLOPIDOGREL BISULFATE 75 MG PO TABS
75.0000 mg | ORAL_TABLET | Freq: Every day | ORAL | Status: DC
  Administered 2014-05-20 – 2014-05-24 (×5): 75 mg via ORAL
  Filled 2014-05-19 (×6): qty 1

## 2014-05-19 MED ORDER — LORAZEPAM 2 MG/ML IJ SOLN
1.0000 mg | Freq: Once | INTRAMUSCULAR | Status: AC
  Administered 2014-05-19: 1 mg via INTRAVENOUS
  Filled 2014-05-19: qty 1

## 2014-05-19 MED ORDER — CEFTRIAXONE SODIUM IN DEXTROSE 20 MG/ML IV SOLN
1.0000 g | INTRAVENOUS | Status: DC
  Administered 2014-05-19 – 2014-05-23 (×5): 1 g via INTRAVENOUS
  Filled 2014-05-19 (×6): qty 50

## 2014-05-19 NOTE — Evaluation (Addendum)
Clinical/Bedside Swallow Evaluation Patient Details  Name: NIVEDITA MIRABELLA MRN: [id number] Date of Birth: [birth year]  Today's Date: 05/19/2014 Time: SLP Start Time (ACUTE ONLY): 0800 SLP Stop Time (ACUTE ONLY): 0845 SLP Time Calculation (min) (ACUTE ONLY): 45 min  Past Medical History:  Past Medical History  Diagnosis Date  . Hypertension   . Hyperlipidemia   . Arthritis   . Hemihypertrophy   . Myocardial infarction   . Thyroid disease   . CVA (cerebral infarction) 2003  . Peripheral vascular disease     s/p stent right leg  . Pulmonary nodule     stable on Chest CT March 2014, Followed at [facility]  . Murmur, cardiac    Past Surgical History:  Past Surgical History  Procedure Laterality Date  . Abdominal hysterectomy    . Total hip arthroplasty      right  . Spine surgery      steel rod in back   HPI:  81 yo female adm to [facility] with speech slurred and dysphagia.  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  MRI showed ? small amount of SAH, chronic right basal ganglia and paracentral pons CVA.  Pt admits to poor appetite x1 month but denies dysphagia being source of poor intake.  She has demonstrated occasional coughing with liquids even prior to admission but has not had pnas.     Assessment / Plan / Recommendation Clinical Impression  Pt presents with mild oral dysphagia due to CN 5 and 7 impact resulting in left facial droop, decreased sensation and mild left sulci residuals.  Pt does sense residuals and clears independently.  No s/s of aspiration and voice *weak since TIA 8 years ago per family* remained clear.  Piecemeal deglutition noted with honey thick and puree.    Pt and Ronald Pippins (daughter) report pt with occasional cough with thin liquids and multiple consistencies at home even prior to admit. Educated pt and daughter Ronald Pippins to diet recommendations,  compensation strategies to migitate her low grade chronic dysphagia (eg using thicker drinks on days when coughing with liquids more pronounced but assuring pt stays hydrated).    Recommend regular/thin diet with general precautions.  Will sign off as all education completed and both pt and daughter report pt swallow ability at baseline.      Aspiration Risk  Mild    Diet Recommendation Regular;Thin liquid   Liquid Administration via: Cup;Straw Medication Administration: Whole meds with puree (start and follow with liquids) Supervision: Patient able to self feed Compensations: Slow rate;Small sips/bites;Check for pocketing Postural Changes and/or Swallow Maneuvers: Seated upright 90 degrees;Upright 30-60 min after meal    Other  Recommendations Oral Care Recommendations: Oral care BID   Follow Up Recommendations  None    Frequency and Duration     n/a   Pertinent Vitals/Pain Afebrile, decreased     Swallow Study Prior Functional Status   see [place] Date of Onset: 05/19/14 HPI: 81 yo female adm to [facility] with speech slurred and dysphagia.  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  MRI showed ? small amount of SAH, chronic right basal ganglia and paracentral pons CVA.  Pt admits to poor appetite x1 month  but denies dysphagia being source of poor intake.  She has demonstrated occasional coughing with liquids even prior to admission but has not had pnas.   Type of Study: Bedside swallow evaluation Diet Prior to this Study: NPO Temperature Spikes Noted: No Respiratory Status: Room air History of Recent Intubation: No Behavior/Cognition: Alert;Cooperative;Pleasant mood Oral Cavity - Dentition: Dentures, top;Adequate natural dentition (lower partial) Self-Feeding Abilities: Able to feed self Patient Positioning: Upright in bed Baseline Vocal Quality: Low  vocal intensity Volitional Cough: Weak Volitional Swallow: Able to elicit    Oral/Motor/Sensory Function Overall Oral Motor/Sensory Function: Impaired (transient episodes of left facial droop) Labial ROM: Reduced left Labial Symmetry: Abnormal symmetry left Facial ROM: Reduced left Facial Symmetry: Left droop Velum: Within Functional Limits Mandible: Within Functional Limits   Ice Chips Ice chips: Within functional limits Presentation: Spoon   Thin Liquid Thin Liquid: Within functional limits Presentation: Self Fed;Straw    Nectar Thick Nectar Thick Liquid: Not tested   Honey Thick Honey Thick Liquid: Within functional limits Presentation: Cup;Self fed   Puree Puree: Impaired Presentation: Self Fed;Spoon Oral Phase Functional Implications: Left lateral sulci pocketing Other Comments: multiple swallows, piecemealing, pt aware of lateral sulci pocketing and removed independently   Solid   GO    Solid: Impaired Oral Phase Impairments: Reduced lingual movement/coordination;Impaired anterior to posterior transit Oral Phase Functional Implications: Oral residue Other Comments: pt aware of residuals adhered to upper denture and cleared independently       Luanna Salk, [facility] [phone]

## 2014-05-19 NOTE — Progress Notes (Signed)
EEG completed, results pending. 

## 2014-05-19 NOTE — Progress Notes (Addendum)
STROKE TEAM PROGRESS NOTE   HISTORY Krista Ellis is an 81 y.o. 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 Patient was with her family member today when she was noted to have right facial droop, difficulty expressing herself and per family, sounded as her tongue was thick. Per family this only lasted for a few minutes and then fully resolved. Currently she is back to her baseline.   Date last known well: Date: 05/18/2014 Time last known well: Time: 13:00 tPA Given: No: symptoms resolved Modified Rankin: Rankin Score=3   SUBJECTIVE (INTERVAL HISTORY) Daughter is at bedside. Talked with another daughter over the phone who knows her episodes happened yesterday. She had two episodes yesterday, one happened in the pm she had 45 sec of word finding difficulty and with LEFT facial droop. Then it happened last night for 79min with only word finding difficulties with no facial droop. Pt and daughter denies any shaking jerking or LOC or b/b incontinence. Daughter stated that since the right SDH two weeks ago, she had some intermittent left hand tremors. EEG done did not show seizure.   OBJECTIVE Temp:  [98 F (36.7 C)-98.3 F (36.8 C)] 98.2 F (36.8 C) (04/29 0600) Pulse Rate:  [63-92] 67 (04/29 0600) Cardiac Rhythm:  [-] Heart block (04/28 1930) Resp:  [15-18] 16 (04/29 0600) BP: (126-158)/(40-67) 158/54 mmHg (04/29 0600) SpO2:  [95 %-100 %] 96 % (04/29 0600)  No results for input(s): GLUCAP in the last 168 hours.  Recent Labs Lab 05/15/14 1344 05/18/14 1412 05/18/14 1424  NA 134* 137 137  K 4.6 4.9 4.9  CL 104 104 103  CO2 27 26  --   GLUCOSE 105* 107* 108*  BUN 16 18 24*  CREATININE 0.61 0.79 0.80  CALCIUM 9.8 9.7  --     Recent Labs Lab 05/15/14 1344 05/18/14 1412  AST 18 24  ALT 8 12  ALKPHOS 46 44  BILITOT 0.5 0.7  PROT 7.1 7.0  ALBUMIN 3.6 3.4*    Recent Labs Lab 05/15/14 1344 05/18/14 1412 05/18/14 1424  WBC 5.9 5.6  --    NEUTROABS 3.0 2.6  --   HGB 13.5 12.7 15.0  HCT 40.6 40.3 44.0  MCV 95.3 98.5  --   PLT 273.0 217  --    No results for input(s): CKTOTAL, CKMB, CKMBINDEX, TROPONINI in the last 168 hours.  Recent Labs  05/18/14 1412  LABPROT 13.1  INR 0.98    Recent Labs  05/18/14 1750  COLORURINE YELLOW  LABSPEC 1.011  PHURINE 6.5  GLUCOSEU NEGATIVE  HGBUR SMALL*  BILIRUBINUR NEGATIVE  KETONESUR NEGATIVE  PROTEINUR NEGATIVE  UROBILINOGEN 1.0  NITRITE NEGATIVE  LEUKOCYTESUR SMALL*       Component Value Date/Time   CHOL 127 05/19/2014 0435   TRIG 69 05/19/2014 0435   HDL 36* 05/19/2014 0435   CHOLHDL 3.5 05/19/2014 0435   VLDL 14 05/19/2014 0435   LDLCALC 77 05/19/2014 0435   No results found for: HGBA1C    Component Value Date/Time   LABOPIA POSITIVE* 05/18/2014 1750   COCAINSCRNUR NONE DETECTED 05/18/2014 1750   LABBENZ NONE DETECTED 05/18/2014 1750   AMPHETMU NONE DETECTED 05/18/2014 1750   THCU NONE DETECTED 05/18/2014 1750   LABBARB NONE DETECTED 05/18/2014 1750    No results for input(s): ETH in the last 168 hours.  I have personally reviewed the radiological images below and agree with the radiology interpretations.  Ct Head Wo Contrast 05/18/2014  Expected evolutionary change in right-sided subdural hematoma which is 4 mm in thickness with progression to lower density fluid.  Atrophy and chronic microvascular ischemia. No superimposed acute abnormality.     Mr Virgel Paling Wo Contrast 05/18/2014   ADDENDUM: Possible small amount of subarachnoid hemorrhage deep to the subdural hematoma.    1. Small, subacute right frontotemporal subdural hematoma without mass effect, unchanged from CT earlier today. 2. No acute infarct.  3. Mild chronic small vessel ischemic disease. Chronic infarcts in the left basal ganglia region and pons.  4. No major intracranial arterial occlusion.  5. Mild-to-moderate distal right vertebral artery stenosis, increased from prior neck CTA.   6. Unchanged, mild mid basilar artery stenosis.  7. Mild stenosis of the proximal right P2 PCA and bilateral cavernous carotids.    CUS - The right ICA Stent showed a mild amount of residual plaque with less than 50% stenosis. The left ICA showed a moderate amount of calcified plaque with 40-59% stenosis, and bilateral vertebral arteries are antegrade.  2D echo - - Left ventricle: The cavity size was normal. Wall thickness was increased in a pattern of mild LVH. Systolic function was vigorous. The estimated ejection fraction was in the range of 65% to 70%. Doppler parameters are consistent with abnormal left ventricular relaxation (grade 1 diastolic dysfunction). - Aortic valve: AV is thickened, calcified. Peak and mean gradients through the valve are 28 and 14 mm Hg respectively consistent with mild aortic stenosis. Valve area (VTI): 1.43 cm^2. Valve area (Vmax): 1.31 cm^2. Valve area (Vmean): 1.49 cm^2. - Pulmonary arteries: PA peak pressure: 32 mm Hg (S).  EEG - Impression: This awake and asleep EEG is mildly abnormal due to occasional diffuse delta slowing of the background.  Clinical Correlation of the above findings indicates mild diffuse cerebral dysfunction that is non-specific in etiology and may be seen with hypoxic/ischemic injury, toxic/metabolic encephalopathies, medication effect. It may be seen with excessive drowsiness as well. The absence of epileptiform discharges does not exclude a clinical diagnosis of epilepsy. Clinical correlation is advised.  PHYSICAL EXAM  Temp:  [97.6 F (36.4 C)-98.3 F (36.8 C)] 97.6 F (36.4 C) (04/29 1329) Pulse Rate:  [63-92] 65 (04/29 1329) Resp:  [16-18] 18 (04/29 1329) BP: (120-158)/(44-67) 120/44 mmHg (04/29 1329) SpO2:  [95 %-100 %] 99 % (04/29 1329)  General - Well nourished, well developed, in no apparent distress.  Ophthalmologic - fundi not visualized due to eye movement.  Cardiovascular - Regular rate and  rhythm with no murmur.  Mental Status -  Level of arousal and orientation to time, place, and person were intact. Language including expression, naming, repetition, comprehension was assessed and found intact. Fund of Knowledge was assessed and was intact.  Cranial Nerves II - XII - II - Visual field intact OU. III, IV, VI - Extraocular movements intact. V - Facial sensation intact bilaterally. VII - Facial movement intact bilaterally. VIII - Hearing & vestibular intact bilaterally. X - Palate elevates symmetrically. XI - Chin turning & shoulder shrug intact bilaterally. XII - Tongue protrusion intact.  Motor Strength - The patient's strength was 4/5 in all extremities and pronator drift was absent.  Bulk was normal and fasciculations were absent.   Motor Tone - Muscle tone was assessed at the neck and appendages and was normal.  Reflexes - The patient's reflexes were 1+ in all extremities and she had no pathological reflexes.  Sensory - Light touch, temperature/pinprick were assessed and were symmetrical. However, she stated that  her bilateral UE sensation was equally decreased due to neuropathy.  Coordination - The patient had normal movements in the hands with no ataxia or dysmetria.  Tremor was absent.  Gait and Station - deferred due to safety concerns.  ASSESSMENT/PLAN Krista Ellis is a 81 y.o. 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  She did not receive IV t-PA due to resolution of deficits.   Possible seizure episode due to right SDH and UTI - episodic left facial droop and slurry speech  Resultant back to baseline  MRI - Small, subacute right frontotemporal SDH and possible small amount of SAH deep to the SDH.  MRA - No major  intracranial arterial occlusion.   Carotid Doppler - right ICA stent 50% stenosis, left ICA 40-59%  2D Echo - unremarkable  LDL - 77  HgbA1c pending  EEG - general slowing but no seizure  SCDs for VTE prophylaxis    NPO  Recommend seizure prevention with keppra 500mg  bid. Continue until sees in clinic in about 2 months.  aspirin 81 mg orally every day prior to admission due to SDH, now on ASA 81mg  secondary to intracerebral hemorrhage but plan to put back on plavix starting tomorrow.  Ongoing aggressive stroke risk factor management  Therapy recommendations:  SNF  Disposition: Pending  Right small SDH  Has been 2 weeks  On ASA 81mg  now  Plan to switch back to plavix tomorrow (ordered)  Likely the cause of possible seizrue  Recommend keppra 500mg  bid until sees in clinic in 2 months.  UTI  UA WBC 21-50  On rocephine  UTI could lower seizure threshold.  Continue management as per primary team  Hypertension  Home meds: Norvasc and Toprol XL  Stable  Patient counseled to be compliant with her blood pressure medications  Hyperlipidemia  Home meds: Pravachol 40 mg daily resumed in hospital  LDL 77, goal < 70  Continue statin at discharge  Diabetes  HgbA1c pending, goal < 7.0  Controlled  Other Active Problems  Mildly elevated BUN  Other Pertinent History  Neuropathy follow up with Dr. Brigitte Pulse in [facility] day # 1  Rosalin Hawking, MD PhD Stroke Neurology 05/19/2014 4:44 PM    To contact Stroke Continuity provider, please refer to [email or web address] After hours, contact General Neurology

## 2014-05-19 NOTE — Progress Notes (Signed)
CARE MANAGEMENT NOTE 05/19/2014  Patient:  Krista Ellis, Krista Ellis   Account Number:  [id number]  Date Initiated:  05/19/2014  Documentation initiated by:  Lorne Skeens  Subjective/Objective Assessment:   Patient was admitted with slurred speech, facial droop. Lives at home with caregivers.     Action/Plan:   Will follow for discharge needs pending PT/OT evals and physician orders   Anticipated DC Date:     Anticipated DC Plan:           Choice offered to / List presented to:             Status of service:  In process, will continue to follow Medicare Important Message given?   (If response is "NO", the following Medicare IM given date fields will be blank) Date Medicare IM given:   Medicare IM given by:   Date Additional Medicare IM given:   Additional Medicare IM given by:    Discharge Disposition:    Per UR Regulation:  Reviewed for med. necessity/level of care/duration of stay  If discussed at [place] of Stay Meetings, dates discussed:    Comments:

## 2014-05-19 NOTE — Procedures (Signed)
ELECTROENCEPHALOGRAM REPORT  Date of Study: 05/19/2014  Patient's Name: Krista Ellis MRN: [id number] Date of Birth: 04/06/1930  Referring Provider: Dr. Rosalin Hawking  Clinical History: This is an 81 year old woman with an episode of right facial droop, difficulty expressing herself. She had another episode of slurred speech with left facial droop and drooling that lasted approx 5 minutes.   Medications: pregabalin (LYRICA) capsule 25 mg acetaminophen (TYLENOL) suppository 650 mg amLODipine (NORVASC) tablet 5 mg cefTRIAXone (ROCEPHIN) 1 g in dextrose 5 % 50 mL IVPB - Premix HYDROcodone-acetaminophen (NORCO/VICODIN) 5-325 MG per tablet 1 tablet levothyroxine (SYNTHROID, LEVOTHROID) tablet 25 mcg metoprolol succinate (TOPROL-XL) 24 hr tablet 12.5 mg pravastatin (PRAVACHOL) tablet 40 mg senna-docusate (Senokot-S) tablet 1 tablet  Technical Summary: A multichannel digital EEG recording measured by the international 10-20 system with electrodes applied with paste and impedances below 5000 ohms performed in our laboratory with EKG monitoring in an awake and asleep patient.  Hyperventilation and photic stimulation were not performed.  The digital EEG was referentially recorded, reformatted, and digitally filtered in a variety of bipolar and referential montages for optimal display.    Description: The patient is awake and asleep during the recording.  During maximal wakefulness, there is a symmetric, medium voltage 9.5 Hz posterior dominant rhythm that attenuates with eye opening.  There is occasional delta slowing seen bilaterally. During drowsiness and sleep, there is an increase in theta and delta slowing of the background.  Vertex waves and symmetric sleep spindles were seen.  Hyperventilation and photic stimulation were not performed.  There were no epileptiform discharges or electrographic seizures seen.    EKG lead was unremarkable.  Impression: This awake and asleep EEG is mildly  abnormal due to occasional diffuse delta slowing of the background.  Clinical Correlation of the above findings indicates mild diffuse cerebral dysfunction that is non-specific in etiology and may be seen with hypoxic/ischemic injury, toxic/metabolic encephalopathies, medication effect. It may be seen with excessive drowsiness as well. The absence of epileptiform discharges does not exclude a clinical diagnosis of epilepsy. Clinical correlation is advised.    Ellouise Newer, M.D.

## 2014-05-19 NOTE — Plan of Care (Signed)
Pt is having an episode now with left facial droop and slurry speech. Tried to drink water but with drooling and chocking. Still consider most likely seizure activity from right SDH, will give 1 mg ativan and start IV keppra. Discussed with Dr. Tana Coast and will put on NPO and request speech re-evaluation. If episode continues, may need to consider MRI repeat.   Rosalin Hawking, MD PhD Stroke Neurology 05/19/2014 5:14 PM

## 2014-05-19 NOTE — Progress Notes (Signed)
Bilateral Carotid Duplex Completed. Preliminary results by tech -  The right ICA Stent showed a mild amount of residual plaque with less than 50% stenosis. The left ICA showed a moderate amount of calcified plaque with  40-59% stenosis, and bilateral vertebral arteries are antegrade.  Oda Cogan, BS, RDMS, RVT

## 2014-05-19 NOTE — Progress Notes (Signed)
  Echocardiogram 2D Echocardiogram has been performed.  [name] M 05/19/2014, 11:59 AM

## 2014-05-19 NOTE — Evaluation (Signed)
Physical Therapy Evaluation Patient Details Name: Krista Ellis MRN: [id number] DOB: 1930/06/24 Today's Date: 05/19/2014   History of Present Illness  81 y.o. female was admitted for TIA with History of subdural hematoma, MI, CVA, and PVD.  Clinical Impression  Pt admitted with the above diagnosis. Pt currently with functional limitations due to the deficits listed below (see PT Problem List). Family reports patient has had multiple falls in the past 6 months at home, and noticed her strength and function has declined over the past several weeks. Today, Krista Ellis required min assist for safe mobility. PTA, she had an aide assist her with morning ADLs but then has no supervision from 1PM until 10PM. Due to history of falls, need for physical assist for safe mobility, and lack of supervision at home, I feel patient would benefit from ST-SNF for rehab, to progress her functional independence prior to returning home. Will continue to work with patient acutely.       Follow Up Recommendations SNF;Supervision for mobility/OOB - If SNF not an option, would attempt to maximize [facility] options.    Equipment Recommendations  None recommended by PT    Recommendations for Other Services       Precautions / Restrictions Precautions Precautions: Fall Restrictions Weight Bearing Restrictions: No      Mobility  Bed Mobility Overal bed mobility: Needs Assistance Bed Mobility: Supine to Sit     Supine to sit: Min assist;HOB elevated     General bed mobility comments: Min assist for truncal support. Leans posteriorly. Use of bed pad to scoot pt to EOB. Cues for technique  Transfers Overall transfer level: Needs assistance Equipment used: Rolling walker (2 wheeled) Transfers: Sit to/from Stand Sit to Stand: Min assist         General transfer comment: Min assist for boost to stand from lowest bed setting, Pt anxious of falling. Stable upon holding RW when standing upright. Min guard with  VC for hand placement to rise from [facility] Poor control with descent into recliner despite cues.  Ambulation/Gait Ambulation/Gait assistance: Min assist Ambulation Distance (Feet): 15 Feet Assistive device: Rolling walker (2 wheeled) Gait Pattern/deviations: Step-through pattern;Decreased stride length;Antalgic;Trunk flexed;Narrow base of support Gait velocity: slow   General Gait Details: Slow and guarded gait pattern. Min assist for walker control. No buckling of LE noted. VC for upright posture with forward gaze frequently. Anxious of falling during bout.  Stairs            Wheelchair Mobility    Modified Rankin (Stroke Patients Only) Modified Rankin (Stroke Patients Only) Pre-Morbid Rankin Score: Moderately severe disability Modified Rankin: Moderately severe disability     Balance Overall balance assessment: Needs assistance;History of Falls Sitting-balance support: Single extremity supported Sitting balance-Leahy Scale: Poor     Standing balance support: Bilateral upper extremity supported Standing balance-Leahy Scale: Poor                               Pertinent Vitals/Pain Pain Assessment: No/denies pain    Home Living Family/patient expects to be discharged to:: Private residence Living Arrangements: Alone Available Help at Discharge: Family;Personal care attendant (aide from 10-1 family checks in on pt at 10 PM) Type of Home: House Home Access: Stairs to enter Entrance Stairs-Rails: Psychiatric nurse of Steps: 4 Home Layout: One level Home Equipment: Bedside commode;Walker - 2 wheels      Prior Function Level of Independence: Needs assistance  Gait / Transfers Assistance Needed: RW for gait  ADL's / Homemaking Assistance Needed: Assist to get out of bed, bath, dress.        Hand Dominance   Dominant Hand: Left    Extremity/Trunk Assessment   Upper Extremity Assessment: Defer to OT evaluation           Lower  Extremity Assessment: RLE deficits/detail RLE Deficits / Details: History of RLE dysfunction. (Pt states she had a stroke in her mother's womb.) Grossy 4/5 strength       Communication   Communication: No difficulties  Cognition Arousal/Alertness: Awake/alert Behavior During Therapy: WFL for tasks assessed/performed Overall Cognitive Status: Within Functional Limits for tasks assessed                      General Comments General comments (skin integrity, edema, etc.): BP 130/60 supine - Sitting 158/50    Exercises        Assessment/Plan    PT Assessment Patient needs continued PT services  PT Diagnosis Difficulty walking;Abnormality of gait;Generalized weakness   PT Problem List Decreased strength;Decreased range of motion;Decreased activity tolerance;Decreased balance;Decreased mobility;Decreased coordination;Decreased knowledge of use of DME;Impaired sensation  PT Treatment Interventions DME instruction;Gait training;Therapeutic activities;Therapeutic exercise;Balance training;Neuromuscular re-education;Patient/family education;Stair training;Functional mobility training   PT Goals (Current goals can be found in the Care Plan section) Acute Rehab PT Goals Patient Stated Goal: Go to rehab  PT Goal Formulation: With patient/family Time For Goal Achievement: 06/02/14 Potential to Achieve Goals: Good    Frequency Min 4X/week   Barriers to discharge Decreased caregiver support stays alone at home most of the day    Co-evaluation               End of Session Equipment Utilized During Treatment: Gait belt Activity Tolerance: Patient tolerated treatment well Patient left: in chair;with call bell/phone within reach;with chair alarm set;with family/visitor present Nurse Communication: Mobility status         Time: 1856-3149 (-5 min non-therapeutic while pt toileting) PT Time Calculation (min) (ACUTE ONLY): 39 min   Charges:   PT Evaluation $Initial PT  Evaluation Tier I: 1 Procedure PT Treatments $Gait Training: 8-22 mins   PT G CodesEllouise Newer 05/19/2014, 1:32 PM Elayne Snare, [place]

## 2014-05-19 NOTE — Progress Notes (Signed)
Initial Nutrition Assessment  DOCUMENTATION CODES:  Non-severe (moderate) malnutrition in context of chronic illness  INTERVENTION:  Ensure Enlive (each supplement provides 350kcal and 20 grams of protein) BID  NUTRITION DIAGNOSIS:  Inadequate oral intake related to other (see comment) (Poor appetite) as evidenced by moderate depletion of body fat, severe depletion of muscle mass, per patient/family report.   GOAL:  Patient will meet greater than or equal to 90% of their needs   MONITOR:  PO intake, Supplement acceptance, Weight trends, Labs  REASON FOR ASSESSMENT:  Malnutrition Screening Tool    ASSESSMENT: 81 year old female patient recently discharged on 05/06/14 after an admission for a right sided subdural hematoma, presumed traumatic in etiology. Patient presented to the ED with intermittent episodes of aphasia with associated inability to control oral secretions. These episodes had been occurring with no particular pattern of the past seven days lasting 1-5 minutes.  Pt reports that she doesn't usually have much of an appetite but, it has been even worse for the past month. She usually drinks one Ensure supplement every morning with breakfast and eats 2 additional small meals daily. She denies any nausea, abdominal pain, or digestion issues; denies chewing or swallowing difficulty. She reports eating 1/2 a sandwich and some soup for lunch. Pt had difficulty sucking through straw at time of visit as well as some difficulty talking; neurologist to bedside.  Labs and medications reviewed.   Height:  Ht Readings from Last 1 Encounters:  05/15/14 5\' 2"  (1.575 m)    Weight:  Wt Readings from Last 1 Encounters:  05/15/14 112 lb 4 oz (50.916 kg)    Ideal Body Weight:  110 kg  Wt Readings from Last 10 Encounters:  05/15/14 112 lb 4 oz (50.916 kg)  05/06/14 111 lb 8.8 oz (50.6 kg)  03/09/14 115 lb 2 oz (52.22 kg)  01/03/14 115 lb (52.164 kg)  12/09/13 113 lb 12 oz  (51.597 kg)  11/18/13 116 lb (52.617 kg)  08/31/13 121 lb 8 oz (55.112 kg)  06/01/13 119 lb (53.978 kg)  04/07/13 118 lb (53.524 kg)  02/21/13 121 lb (54.885 kg)    BMI:  There is no weight on file to calculate BMI.  Estimated Nutritional Needs:  Kcal:  1300-1500  Protein:  60-70 grams  Fluid:  1.3-1.5 L/day  Skin:  Reviewed, no issues  Diet Order:  Diet regular Room service appropriate?: Yes; Fluid consistency:: Thin  EDUCATION NEEDS:  No education needs identified at this time  No intake or output data in the 24 hours ending 05/19/14 1703  Last BM:  4/28   Pryor Ochoa RD, LDN Inpatient Clinical Dietitian Pager: [phone] After Hours Pager: [phone]

## 2014-05-19 NOTE — Progress Notes (Signed)
[facility]                                                                              Patient Demographics  Krista Ellis, is a 81 y.o. female, DOB - 1930-04-24, GNF:[id number]  Admit date - 05/18/2014   Admitting Physician Annita Brod, MD  Outpatient Primary MD for the patient is [name], MD  LOS - 1   Chief Complaint  Patient presents with  . Aphasia  . Dysphagia       Brief HPI   The patient is a 81 year old female patient recently discharged on 05/06/14 after an admission for a right sided subdural hematoma, presumed traumatic in etiology. Prior to admission patient had been on Plavix and this medicine was placed on hold last resume on 4/30. Patient presented to the ED with intermittent episodes of aphasia with associated inability to control oral secretions. These episodes had been occurring with no particular pattern of the past seven days lasting 1-5 minutes. Patient's daughter reported that, she had been having right supraorbital headache several days before she fell and was diagnosed with subdural hematoma. She denied any visual changes, facial drooping, numbness or tingling or weakness in any of the extremities. Her daughter at the bedside also noticed that the patient has had poor oral intake for at least one week. When questioned about rationale for Plavix, patient could not recall the exact diagnosis this was prescribed for and her daughter reports that they think the Plavix was started around 1991.  In the ER patient was afebrile and hemodynamically stable maintaining O2 saturations of 98% on room air. CT of the head without contrast revealed expected evolutionary change in the right-sided subdural hematoma which is 4 mm in thickness with progression to a lower density fluid. There was atrophy and chronic microvascular ischemia without any superimposed acute abnormality. Neurology was consulted and patient was admitted for further  workup.   Assessment & Plan    TIA  - MRI/MRA showed small subacute right frontotemporal subdural hematoma and possible small amount of subarachnoid hemorrhage deep to the subdural hematoma without mass effect, no acute infarct - MRA showed mild to moderate distal right vertebral artery stenosis, increased from prior neck CTA, mild mid basilar artery stenosis, mild chronic small vessel ischemic disease. - 2-D echo showed EF of 84-69%, grade 1 diastolic dysfunction - EEG results pending - No antiplatelet given recent subdural hematoma and recommendations to not resume Plavix until 4/30 - PT, OT evaluation-> skilled nursing facility -Carotid Dopplers pending   Active Problems:  Recurrent falls/known gait disturbance -PT evaluation as above   History of subdural hematoma - Continue to hold Plavix   Hypertension -Blood pressure currently well controlled -Continue preadmission Norvasc and Toprol   Hypothyroidism -Continue Synthroid   Osteoarthritis -Continue hydrocodone and Lyrica   Systolic murmur -Echocardiogram had been planned as an outpatient to further evaluate but this will be completed during the hospitalization as part of the protocol for TIA evaluation   Dyslipidemia -Continue statin - Lipid panel showed LDL 77    UTI (urinary tract infection) - Follow urine culture and sensitivities, placed on IV Rocephin.  Code Status: DO NOT RESUSCITATE  Family Communication: Discussed in detail with the patient, all imaging results, lab results explained to the patient and daughter   Disposition Plan: Hopefully tomorrow once workup is complete  Time Spent in minutes  25 minutes  Procedures  MRI, MRA, 2-D echo  Consults   Neurology  DVT ProphylaxisSCDs   Medications  Scheduled Meds: . amLODipine  5 mg Oral Daily  . cefTRIAXone (ROCEPHIN)  IV  1 g Intravenous Q24H  . levothyroxine  25 mcg Oral QAC breakfast  . metoprolol succinate  12.5 mg Oral Daily  .  pravastatin  40 mg Oral QPM  . pregabalin  25 mg Oral BID   Continuous Infusions:  PRN Meds:.acetaminophen, HYDROcodone-acetaminophen, senna-docusate   Antibiotics   Anti-infectives    Start     Dose/Rate Route Frequency Ordered Stop   05/19/14 1100  cefTRIAXone (ROCEPHIN) 1 g in dextrose 5 % 50 mL IVPB - Premix     1 g 100 mL/hr over 30 Minutes Intravenous Every 24 hours 05/19/14 0931          Subjective:   Krista Ellis was seen and examined today.  Patient denies dizziness, chest pain, shortness of breath, abdominal pain, N/V/D/C, new weakness, numbess, tingling. No acute events overnight.    Objective:   Blood pressure 120/44, pulse 65, temperature 97.6 F (36.4 C), temperature source Oral, resp. rate 18, SpO2 99 %.  Wt Readings from Last 3 Encounters:  05/15/14 50.916 kg (112 lb 4 oz)  05/06/14 50.6 kg (111 lb 8.8 oz)  03/09/14 52.22 kg (115 lb 2 oz)    No intake or output data in the 24 hours ending 05/19/14 1347  Exam  General: Alert and oriented x 3, NAD  HEENT:  PERRLA, EOMI, Anicteic Sclera, mucous membranes moist.   Neck: Supple, no JVD, no masses  CVS: S1 S2 auscultated, no rubs, 3/6 midsystolic murmur at left sternal border Regular rate and rhythm.  Respiratory: Clear to auscultation bilaterally, no wheezing, rales or rhonchi  Abdomen: Soft, nontender, nondistended, + bowel sounds  Ext: no cyanosis clubbing or edema  Neuro: AAOx3, Cr N's II- XII. Strength 5/5 upper and lower extremities bilaterally  Skin: No rashes  Psych: Normal affect and demeanor, alert and oriented x3    Data Review   Micro Results No results found for this or any previous visit (from the past 240 hour(s)).  Radiology Reports Ct Head Wo Contrast  05/18/2014   CLINICAL DATA:  Aphasia and dysphagia.  Followup subdural hematoma.  EXAM: CT HEAD WITHOUT CONTRAST  TECHNIQUE: Contiguous axial images were obtained from the base of the skull through the vertex without  intravenous contrast.  COMPARISON:  CT head 05/06/2014  FINDINGS: Resolving right-sided subdural hematoma. This measures approximately 4 mm in thickness and has become less dense compared with the prior study. No new area of hemorrhage  Generalized atrophy. Chronic ischemic changes throughout the white matter and centrum semiovale on the left unchanged. No acute infarct. Negative for mass lesion. No shift of the midline structures.  Arterial calcification.  Negative for skull fracture  IMPRESSION: Expected evolutionary change in right-sided subdural hematoma which is 4 mm in thickness with progression to lower density fluid.  Atrophy and chronic microvascular ischemia. No superimposed acute abnormality.   Electronically Signed   By: Franchot Gallo M.D.   On: 05/18/2014 14:58   Ct Head Wo Contrast  05/06/2014   CLINICAL DATA:  Followup subdural hematoma.  EXAM: CT HEAD WITHOUT  CONTRAST  TECHNIQUE: Contiguous axial images were obtained from the base of the skull through the vertex without intravenous contrast.  COMPARISON:  05/05/2014  FINDINGS: Skull and Sinuses:Mild right parietal scalp swelling.  No fracture.  Orbits: Bilateral cataract resection.  No traumatic findings  Brain: Stable 4 mm subdural hematoma around the right frontal operculum without mass effect. No new site of hemorrhage is detected.  Remote small vessel infarct involving the left corona radiata. Mild small vessel ischemic gliosis around the lateral ventricles. There is cerebral volume loss which is typical for age.  No evidence of acute infarct, mass lesion, or hydrocephalus.  IMPRESSION: Stable 4 mm subdural hematoma in the right frontal region.   Electronically Signed   By: Monte Fantasia M.D.   On: 05/06/2014 06:15   Mr Jodene Nam Head Wo Contrast  05/18/2014   ADDENDUM REPORT: 05/18/2014 18:07  ADDENDUM: Possible small amount of subarachnoid hemorrhage deep to the subdural hematoma.   Electronically Signed   By: Logan Bores   On: 05/18/2014  18:07   05/18/2014   CLINICAL DATA:  Right-sided subdural hematoma on CT. Transient right-sided facial droop and aphasia.  EXAM: MRI HEAD WITHOUT CONTRAST  MRA HEAD WITHOUT CONTRAST  TECHNIQUE: Multiplanar, multiecho pulse sequences of the brain and surrounding structures were obtained without intravenous contrast. Angiographic images of the head were obtained using MRA technique without contrast.  COMPARISON:  Head CT 05/18/2014. Brain MRI 06/24/2007. CTA neck 04/14/2007. Neck MRA 03/19/2007.  FINDINGS: MRI HEAD FINDINGS  Limited evaluation of the upper cervical spine demonstrates advanced multilevel disc degeneration in the mid cervical spine with anterolisthesis of C3 on C4, more fully evaluated on recent cervical spine CT.  Small right frontotemporal subdural hematoma demonstrates T1 and T2 hyperintensity and measures up to 6 mm in thickness without significant mass effect. FLAIR hyperintensity in a few adjacent right frontal sulci may represent a tiny amount of subarachnoid hemorrhage or artifact. There is no evidence of acute infarct, parenchymal hemorrhage, mass, or midline shift. There is mild cerebral atrophy. A small, chronic infarct is noted in the left lateral lenticulostriate territory. Foci of T2 hyperintensity elsewhere in the cerebral white matter bilaterally are nonspecific but compatible with mild chronic small vessel ischemic disease, slightly progressed from the prior MRI. There is also an unchanged, tiny chronic infarct in the right paracentral pons.  Prior bilateral cataract extraction is noted. Paranasal sinuses are clear. There are trace bilateral mastoid effusions. Major intracranial vascular flow voids are preserved.  MRA HEAD FINDINGS  Visualized distal vertebral arteries are patent and codominant. There is a mild to moderate distal right vertebral artery stenosis, increased from prior CTA. PICA, AICA, and SCA origins are patent. Mild mid basilar artery narrowing is unchanged.  Communicating arteries are not identified. P1 segments are patent without stenosis. There is a mild proximal right P2 stenosis, and there is mild-to-moderate PCA irregularity bilaterally more distally.  Internal carotid arteries are patent from skullbase to carotid termini. Irregularity and mild narrowing near the anterior genu of the carotids bilaterally is similar to the prior MRA. Right ACA is dominant. There is mild left A1 segment irregularity diffusely with at most mild narrowing proximally. MCAs are patent without significant stenosis. No intracranial aneurysm is identified.  IMPRESSION: 1. Small, subacute right frontotemporal subdural hematoma without mass effect, unchanged from CT earlier today. 2. No acute infarct. 3. Mild chronic small vessel ischemic disease. Chronic infarcts in the left basal ganglia region and pons. 4. No major intracranial arterial occlusion. 5.  Mild-to-moderate distal right vertebral artery stenosis, increased from prior neck CTA. 6. Unchanged, mild mid basilar artery stenosis. 7. Mild stenosis of the proximal right P2 PCA and bilateral cavernous carotids.  Electronically Signed: By: Logan Bores On: 05/18/2014 18:02   Mr Brain Wo Contrast  05/18/2014   ADDENDUM REPORT: 05/18/2014 18:07  ADDENDUM: Possible small amount of subarachnoid hemorrhage deep to the subdural hematoma.   Electronically Signed   By: Logan Bores   On: 05/18/2014 18:07   05/18/2014   CLINICAL DATA:  Right-sided subdural hematoma on CT. Transient right-sided facial droop and aphasia.  EXAM: MRI HEAD WITHOUT CONTRAST  MRA HEAD WITHOUT CONTRAST  TECHNIQUE: Multiplanar, multiecho pulse sequences of the brain and surrounding structures were obtained without intravenous contrast. Angiographic images of the head were obtained using MRA technique without contrast.  COMPARISON:  Head CT 05/18/2014. Brain MRI 06/24/2007. CTA neck 04/14/2007. Neck MRA 03/19/2007.  FINDINGS: MRI HEAD FINDINGS  Limited evaluation of the  upper cervical spine demonstrates advanced multilevel disc degeneration in the mid cervical spine with anterolisthesis of C3 on C4, more fully evaluated on recent cervical spine CT.  Small right frontotemporal subdural hematoma demonstrates T1 and T2 hyperintensity and measures up to 6 mm in thickness without significant mass effect. FLAIR hyperintensity in a few adjacent right frontal sulci may represent a tiny amount of subarachnoid hemorrhage or artifact. There is no evidence of acute infarct, parenchymal hemorrhage, mass, or midline shift. There is mild cerebral atrophy. A small, chronic infarct is noted in the left lateral lenticulostriate territory. Foci of T2 hyperintensity elsewhere in the cerebral white matter bilaterally are nonspecific but compatible with mild chronic small vessel ischemic disease, slightly progressed from the prior MRI. There is also an unchanged, tiny chronic infarct in the right paracentral pons.  Prior bilateral cataract extraction is noted. Paranasal sinuses are clear. There are trace bilateral mastoid effusions. Major intracranial vascular flow voids are preserved.  MRA HEAD FINDINGS  Visualized distal vertebral arteries are patent and codominant. There is a mild to moderate distal right vertebral artery stenosis, increased from prior CTA. PICA, AICA, and SCA origins are patent. Mild mid basilar artery narrowing is unchanged. Communicating arteries are not identified. P1 segments are patent without stenosis. There is a mild proximal right P2 stenosis, and there is mild-to-moderate PCA irregularity bilaterally more distally.  Internal carotid arteries are patent from skullbase to carotid termini. Irregularity and mild narrowing near the anterior genu of the carotids bilaterally is similar to the prior MRA. Right ACA is dominant. There is mild left A1 segment irregularity diffusely with at most mild narrowing proximally. MCAs are patent without significant stenosis. No intracranial  aneurysm is identified.  IMPRESSION: 1. Small, subacute right frontotemporal subdural hematoma without mass effect, unchanged from CT earlier today. 2. No acute infarct. 3. Mild chronic small vessel ischemic disease. Chronic infarcts in the left basal ganglia region and pons. 4. No major intracranial arterial occlusion. 5. Mild-to-moderate distal right vertebral artery stenosis, increased from prior neck CTA. 6. Unchanged, mild mid basilar artery stenosis. 7. Mild stenosis of the proximal right P2 PCA and bilateral cavernous carotids.  Electronically Signed: By: Logan Bores On: 05/18/2014 18:02    CBC  Recent Labs Lab 05/15/14 1344 05/18/14 1412 05/18/14 1424  WBC 5.9 5.6  --   HGB 13.5 12.7 15.0  HCT 40.6 40.3 44.0  PLT 273.0 217  --   MCV 95.3 98.5  --   MCH  --  31.1  --  MCHC 33.3 31.5  --   RDW 12.8 12.6  --   LYMPHSABS 2.2 2.3  --   MONOABS 0.6 0.6  --   EOSABS 0.1 0.1  --   BASOSABS 0.0 0.0  --     Chemistries   Recent Labs Lab 05/15/14 1344 05/18/14 1412 05/18/14 1424  NA 134* 137 137  K 4.6 4.9 4.9  CL 104 104 103  CO2 27 26  --   GLUCOSE 105* 107* 108*  BUN 16 18 24*  CREATININE 0.61 0.79 0.80  CALCIUM 9.8 9.7  --   AST 18 24  --   ALT 8 12  --   ALKPHOS 46 44  --   BILITOT 0.5 0.7  --    ------------------------------------------------------------------------------------------------------------------ estimated creatinine clearance is 43.6 mL/min (by C-G formula based on Cr of 0.8). ------------------------------------------------------------------------------------------------------------------ No results for input(s): HGBA1C in the last 72 hours. ------------------------------------------------------------------------------------------------------------------  Recent Labs  05/19/14 0435  CHOL 127  HDL 36*  LDLCALC 77  TRIG 69  CHOLHDL 3.5    ------------------------------------------------------------------------------------------------------------------ No results for input(s): TSH, T4TOTAL, T3FREE, THYROIDAB in the last 72 hours.  Invalid input(s): FREET3 ------------------------------------------------------------------------------------------------------------------ No results for input(s): VITAMINB12, FOLATE, FERRITIN, TIBC, IRON, RETICCTPCT in the last 72 hours.  Coagulation profile  Recent Labs Lab 05/18/14 1412  INR 0.98    No results for input(s): DDIMER in the last 72 hours.  Cardiac Enzymes No results for input(s): CKMB, TROPONINI, MYOGLOBIN in the last 168 hours.  Invalid input(s): CK ------------------------------------------------------------------------------------------------------------------ Invalid input(s): POCBNP  No results for input(s): GLUCAP in the last 72 hours.   Krista Ellis M.D. [facility] 05/19/2014, 1:47 PM  Pager: [phone]   Between 7am to 7pm - call Pager - [phone]  After 7pm go to [email or web address] - password [email or web address]  Call night coverage person covering after 7pm

## 2014-05-19 NOTE — Progress Notes (Signed)
UR complete.  Dannilynn Gallina RN, MSN 

## 2014-05-19 NOTE — Progress Notes (Signed)
2150 hrs pt had period of slurred speech with Left facial droop and drooling that lasted approx 5 minutes. Family present stated this was 4th time today for condition and that similar incidents had been happening for past several days, On call was contacted and advised to monitor condition. Symptoms resolved completely a short time later.

## 2014-05-20 ENCOUNTER — Encounter (HOSPITAL_COMMUNITY): Payer: Self-pay

## 2014-05-20 LAB — URINALYSIS, ROUTINE W REFLEX MICROSCOPIC
Bilirubin Urine: NEGATIVE
Glucose, UA: NEGATIVE mg/dL
Ketones, ur: NEGATIVE mg/dL
NITRITE: NEGATIVE
PH: 5.5 (ref 5.0–8.0)
PROTEIN: NEGATIVE mg/dL
Specific Gravity, Urine: 1.02 (ref 1.005–1.030)
Urobilinogen, UA: 1 mg/dL (ref 0.0–1.0)

## 2014-05-20 LAB — COMPREHENSIVE METABOLIC PANEL
ALBUMIN: 2.9 g/dL — AB (ref 3.5–5.2)
ALK PHOS: 40 U/L (ref 39–117)
ALT: 9 U/L (ref 0–35)
AST: 20 U/L (ref 0–37)
Anion gap: 9 (ref 5–15)
BUN: 12 mg/dL (ref 6–23)
CO2: 23 mmol/L (ref 19–32)
Calcium: 8.9 mg/dL (ref 8.4–10.5)
Chloride: 105 mmol/L (ref 96–112)
Creatinine, Ser: 0.59 mg/dL (ref 0.50–1.10)
GFR calc Af Amer: >90 mL/min (ref >90)
GFR calc non Af Amer: 84 mL/min — ABNORMAL LOW (ref >90)
Glucose, Bld: 117 mg/dL — ABNORMAL HIGH (ref 70–99)
Potassium: 3.9 mmol/L (ref 3.5–5.1)
SODIUM: 137 mmol/L (ref 135–145)
TOTAL PROTEIN: 6 g/dL (ref 6.0–8.3)
Total Bilirubin: 0.5 mg/dL (ref 0.3–1.2)

## 2014-05-20 LAB — HEMOGLOBIN A1C
Hgb A1c MFr Bld: 5.5 % (ref 4.8–5.6)
Mean Plasma Glucose: 111 mg/dL

## 2014-05-20 LAB — URINE MICROSCOPIC-ADD ON

## 2014-05-20 MED ORDER — FLUTICASONE PROPIONATE 50 MCG/ACT NA SUSP
1.0000 | Freq: Every day | NASAL | Status: DC
  Administered 2014-05-21 – 2014-05-24 (×4): 1 via NASAL
  Filled 2014-05-20: qty 16

## 2014-05-20 MED ORDER — WHITE PETROLATUM GEL
Status: AC
  Administered 2014-05-20: 11:00:00
  Filled 2014-05-20: qty 1

## 2014-05-20 MED ORDER — SODIUM CHLORIDE 0.9 % IV SOLN
1000.0000 mg | INTRAVENOUS | Status: AC
  Administered 2014-05-20: 1000 mg via INTRAVENOUS
  Filled 2014-05-20: qty 10

## 2014-05-20 MED ORDER — RESOURCE THICKENUP CLEAR PO POWD
ORAL | Status: DC | PRN
  Filled 2014-05-20 (×2): qty 125

## 2014-05-20 MED ORDER — LORATADINE 10 MG PO TABS
10.0000 mg | ORAL_TABLET | Freq: Every day | ORAL | Status: DC
  Administered 2014-05-20 – 2014-05-24 (×5): 10 mg via ORAL
  Filled 2014-05-20 (×5): qty 1

## 2014-05-20 MED ORDER — SODIUM CHLORIDE 0.9 % IV SOLN
1000.0000 mg | Freq: Two times a day (BID) | INTRAVENOUS | Status: DC
  Administered 2014-05-20: 1000 mg via INTRAVENOUS
  Filled 2014-05-20 (×3): qty 10
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
## 2014-05-20 NOTE — Progress Notes (Addendum)
STROKE TEAM PROGRESS NOTE   HISTORY Krista Ellis is an 81 y.o. 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 Patient was with her family member today when she was noted to have right facial droop, difficulty expressing herself and per family, sounded as her tongue was thick. Per family this only lasted for a few minutes and then fully resolved. Currently she is back to her baseline.  This AM another episode of L facial droop and slurry speech.    Date last known well: Date: 05/18/2014 Time last known well: Time: 13:00 tPA Given: No: symptoms resolved Modified Rankin: Rankin Score=3   SUBJECTIVE (INTERVAL HISTORY) This AM another episode of slurry speech and L facial droop.  Keppra has been increased to 1gm BID. Pt now back to baseline.    OBJECTIVE Temp:  [96.8 F (36 C)-98.4 F (36.9 C)] 98.1 F (36.7 C) (04/30 0514) Pulse Rate:  [58-68] 58 (04/30 0928) Cardiac Rhythm:  [-] Sinus bradycardia;Heart block (04/29 1945) Resp:  [14-18] 16 (04/30 0928) BP: (115-144)/(44-54) 137/52 mmHg (04/30 0928) SpO2:  [93 %-99 %] 94 % (04/30 0928) Weight:  [50.803 kg (112 lb)] 50.803 kg (112 lb) (04/30 0100)  No results for input(s): GLUCAP in the last 168 hours.  Recent Labs Lab 05/15/14 1344 05/18/14 1412 05/18/14 1424  NA 134* 137 137  K 4.6 4.9 4.9  CL 104 104 103  CO2 27 26  --   GLUCOSE 105* 107* 108*  BUN 16 18 24*  CREATININE 0.61 0.79 0.80  CALCIUM 9.8 9.7  --     Recent Labs Lab 05/15/14 1344 05/18/14 1412  AST 18 24  ALT 8 12  ALKPHOS 46 44  BILITOT 0.5 0.7  PROT 7.1 7.0  ALBUMIN 3.6 3.4*    Recent Labs Lab 05/15/14 1344 05/18/14 1412 05/18/14 1424  WBC 5.9 5.6  --   NEUTROABS 3.0 2.6  --   HGB 13.5 12.7 15.0  HCT 40.6 40.3 44.0  MCV 95.3 98.5  --   PLT 273.0 217  --    No results for input(s): CKTOTAL, CKMB, CKMBINDEX, TROPONINI in the last 168 hours.  Recent Labs  05/18/14 1412  LABPROT 13.1  INR 0.98    Recent  Labs  05/18/14 1750 05/20/14 0225  COLORURINE YELLOW AMBER*  LABSPEC 1.011 1.020  PHURINE 6.5 5.5  GLUCOSEU NEGATIVE NEGATIVE  HGBUR SMALL* LARGE*  BILIRUBINUR NEGATIVE NEGATIVE  KETONESUR NEGATIVE NEGATIVE  PROTEINUR NEGATIVE NEGATIVE  UROBILINOGEN 1.0 1.0  NITRITE NEGATIVE NEGATIVE  LEUKOCYTESUR SMALL* MODERATE*       Component Value Date/Time   CHOL 127 05/19/2014 0435   TRIG 69 05/19/2014 0435   HDL 36* 05/19/2014 0435   CHOLHDL 3.5 05/19/2014 0435   VLDL 14 05/19/2014 0435   LDLCALC 77 05/19/2014 0435   Lab Results  Component Value Date   HGBA1C 5.5 05/19/2014      Component Value Date/Time   LABOPIA POSITIVE* 05/18/2014 1750   COCAINSCRNUR NONE DETECTED 05/18/2014 1750   LABBENZ NONE DETECTED 05/18/2014 1750   AMPHETMU NONE DETECTED 05/18/2014 1750   THCU NONE DETECTED 05/18/2014 1750   LABBARB NONE DETECTED 05/18/2014 1750    No results for input(s): ETH in the last 168 hours.  I have personally reviewed the radiological images below and agree with the radiology interpretations.  Ct Head Wo Contrast 05/18/2014    Expected evolutionary change in right-sided subdural hematoma which is 4 mm in thickness with progression to  lower density fluid.  Atrophy and chronic microvascular ischemia. No superimposed acute abnormality.     Mr Virgel Paling Wo Contrast 05/18/2014   ADDENDUM: Possible small amount of subarachnoid hemorrhage deep to the subdural hematoma.    1. Small, subacute right frontotemporal subdural hematoma without mass effect, unchanged from CT earlier today. 2. No acute infarct.  3. Mild chronic small vessel ischemic disease. Chronic infarcts in the left basal ganglia region and pons.  4. No major intracranial arterial occlusion.  5. Mild-to-moderate distal right vertebral artery stenosis, increased from prior neck CTA.  6. Unchanged, mild mid basilar artery stenosis.  7. Mild stenosis of the proximal right P2 PCA and bilateral cavernous carotids.     CUS - The right ICA Stent showed a mild amount of residual plaque with less than 50% stenosis. The left ICA showed a moderate amount of calcified plaque with 40-59% stenosis, and bilateral vertebral arteries are antegrade.  2D echo - - Left ventricle: The cavity size was normal. Wall thickness was increased in a pattern of mild LVH. Systolic function was vigorous. The estimated ejection fraction was in the range of 65% to 70%. Doppler parameters are consistent with abnormal left ventricular relaxation (grade 1 diastolic dysfunction). - Aortic valve: AV is thickened, calcified. Peak and mean gradients through the valve are 28 and 14 mm Hg respectively consistent with mild aortic stenosis. Valve area (VTI): 1.43 cm^2. Valve area (Vmax): 1.31 cm^2. Valve area (Vmean): 1.49 cm^2. - Pulmonary arteries: PA peak pressure: 32 mm Hg (S).  EEG - Impression: This awake and asleep EEG is mildly abnormal due to occasional diffuse delta slowing of the background.  Clinical Correlation of the above findings indicates mild diffuse cerebral dysfunction that is non-specific in etiology and may be seen with hypoxic/ischemic injury, toxic/metabolic encephalopathies, medication effect. It may be seen with excessive drowsiness as well. The absence of epileptiform discharges does not exclude a clinical diagnosis of epilepsy. Clinical correlation is advised.  PHYSICAL EXAM  Temp:  [96.8 F (36 C)-98.4 F (36.9 C)] 98.1 F (36.7 C) (04/30 0514) Pulse Rate:  [58-68] 58 (04/30 0928) Resp:  [14-18] 16 (04/30 0928) BP: (115-144)/(44-54) 137/52 mmHg (04/30 0928) SpO2:  [93 %-99 %] 94 % (04/30 0928) Weight:  [50.803 kg (112 lb)] 50.803 kg (112 lb) (04/30 0100)  General - Well nourished, well developed, in no apparent distress.  Ophthalmologic - fundi not visualized due to eye movement.  Cardiovascular - Regular rate and rhythm with no murmur.  Mental Status -  Level of arousal and  orientation to time, place, and person were intact. Language including expression, naming, repetition, comprehension was assessed and found intact. Fund of Knowledge was assessed and was intact.  Cranial Nerves II - XII - II - Visual field intact OU. III, IV, VI - Extraocular movements intact. V - Facial sensation intact bilaterally. VII - Facial movement intact bilaterally. VIII - Hearing & vestibular intact bilaterally. X - Palate elevates symmetrically. XI - Chin turning & shoulder shrug intact bilaterally. XII - Tongue protrusion intact.  Motor Strength - The patient's strength was 4/5 in all extremities and pronator drift was absent.  Bulk was normal and fasciculations were absent.   Motor Tone - Muscle tone was assessed at the neck and appendages and was normal.  Reflexes - The patient's reflexes were 1+ in all extremities and she had no pathological reflexes.  Sensory - Light touch, temperature/pinprick were assessed and were symmetrical. However, she stated that her bilateral UE sensation  was equally decreased due to neuropathy.  Coordination - The patient had normal movements in the hands with no ataxia or dysmetria.  Tremor was absent.  Gait and Station - deferred due to safety concerns.  ASSESSMENT/PLAN Ms. JAVONNA BALLI is a 81 y.o. 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  She did not receive IV t-PA due to resolution of deficits.   Possible seizure episode due to right SDH and UTI - episodic left facial droop and slurry speech- another episode this AM.   Resultant back to baseline- Increased Keppra 1gm BID  MRI - Small, subacute right frontotemporal SDH and possible small amount of SAH deep to the SDH.  MRA - No major intracranial  arterial occlusion.   Carotid Doppler - right ICA stent 50% stenosis, left ICA 40-59%  2D Echo - unremarkable  LDL - 77  EEG - general slowing but no seizure. W ill hold off from repeating it.    SCDs for VTE prophylaxis  Diet NPO time specified Except for: Sips with Meds NPO  Recommend seizure prevention with keppra 500mg  bid, Now increase to 1000 BID, s/p 1gm bolus. aspirin 81 mg orally every day prior to admission due to SDH, now on ASA 81mg  Ongoing aggressive stroke risk factor management  Therapy recommendations:  SNF  Disposition: Pending  S/p discussion w/ daughter.   Right small SDH  Has been 2 weeks  On ASA 81mg  now  Plan to switch back to plavix tomorrow (ordered)  Likely the cause of possible seizrue  Recommend keppra 500mg  bid until sees in clinic in 2 months.  UTI  UA WBC 21-50  On rocephine  UTI could lower seizure threshold.  Continue management as per primary team  Hypertension  Home meds: Norvasc and Toprol XL  Stable  Patient counseled to be compliant with her blood pressure medications  Hyperlipidemia  Home meds: Pravachol 40 mg daily resumed in hospital  LDL 77, goal < 70  Continue statin at discharge  Diabetes  HgbA1c pending, goal < 7.0  Controlled  Other Active Problems  Mildly elevated BUN  Other Pertinent History  Neuropathy follow up with Dr. Brigitte Pulse in [place]  S/p discussion with daughter at [facility] day # 2  Leotis Pain   05/20/2014 10:51 AM    To contact Stroke Continuity provider, please refer to [email or web address] After hours, contact General Neurology

## 2014-05-20 NOTE — Progress Notes (Signed)
[facility]                                                                              Patient Demographics  Krista Ellis, is a 81 y.o. female, DOB - Jun 06, 1930, RKY:[id number]  Admit date - 05/18/2014   Admitting Physician Annita Brod, MD  Outpatient Primary MD for the patient is [name], MD  LOS - 2   Chief Complaint  Patient presents with  . Aphasia  . Dysphagia       Brief HPI   The patient is a 81 year old female patient recently discharged on 05/06/14 after an admission for a right sided subdural hematoma, presumed traumatic in etiology. Prior to admission patient had been on Plavix and this medicine was placed on hold last resume on 4/30. Patient presented to the ED with intermittent episodes of aphasia with associated inability to control oral secretions. These episodes had been occurring with no particular pattern of the past seven days lasting 1-5 minutes. Patient's daughter reported that, she had been having right supraorbital headache several days before she fell and was diagnosed with subdural hematoma. She denied any visual changes, facial drooping, numbness or tingling or weakness in any of the extremities. Her daughter at the bedside also noticed that the patient has had poor oral intake for at least one week. When questioned about rationale for Plavix, patient could not recall the exact diagnosis this was prescribed for and her daughter reports that they think the Plavix was started around 1991.  In the ER patient was afebrile and hemodynamically stable maintaining O2 saturations of 98% on room air. CT of the head without contrast revealed expected evolutionary change in the right-sided subdural hematoma which is 4 mm in thickness with progression to a lower density fluid. There was atrophy and chronic microvascular ischemia without any superimposed acute abnormality. Neurology was consulted and patient was admitted for further  workup.   Assessment & Plan    TIA  - MRI/MRA showed small subacute right frontotemporal subdural hematoma and possible small amount of subarachnoid hemorrhage deep to the subdural hematoma without mass effect, no acute infarct - MRA showed mild to moderate distal right vertebral artery stenosis, increased from prior neck CTA, mild mid basilar artery stenosis, mild chronic small vessel ischemic disease. - 2-D echo showed EF of 31-51%, grade 1 diastolic dysfunction - EEG showed generalized slowing but no seizures  - Placed on aspirin 81 mg daily by neurology.  - PT, OT evaluation-> skilled nursing facility -Carotid Dopplers right ICA 50% stenosis, left ICA 40-59% - Passed a swallow testing this morning, placed on dysphagia 3 diet  Seizures - Patient had another episode of aphasia this morning similar to previous episode yesterday evening which was thought to be possibly seizure, hence was started on Keppra - Discussed in detail with Dr. Irish Elders, recommended one dose of 1 g IV Keppra 1 and increase Keppra to 1 g twice a day     Recurrent falls/known gait disturbance -PT evaluation recommended skilled nursing facility   History of subdural hematoma - Continue to hold Plavix   Hypertension -Blood pressure currently well controlled -Continue preadmission  Norvasc and Toprol   Hypothyroidism -Continue Synthroid   Osteoarthritis -Continue hydrocodone and Lyrica   Systolic murmur -2-D echo showed EF of 19-14%, grade 1 diastolic dysfunction, mild aortic stenosis   Dyslipidemia -Continue statin - Lipid panel showed LDL 77    UTI (urinary tract infection) - Follow urine culture and sensitivities, placed on IV Rocephin.   Code Status: DO NOT RESUSCITATE  Family Communication: Discussed in detail with the patient, all imaging results, lab results explained to the patient and daughter   Disposition Plan: Need skilled nursing facility, daughter at the bedside requesting  skilled nursing facility in [place]  Time Spent in minutes  25 minutes  Procedures  MRI, MRA, 2-D echo  Consults   Neurology   DVT Prophylaxis  SCDs   Medications  Scheduled Meds: . amLODipine  5 mg Oral Daily  . cefTRIAXone (ROCEPHIN)  IV  1 g Intravenous Q24H  . clopidogrel  75 mg Oral Daily  . feeding supplement (ENSURE ENLIVE)  237 mL Oral BID PC  . fluticasone  1 spray Each Nare Daily  . levETIRAcetam  1,000 mg Intravenous Q12H  . levothyroxine  25 mcg Oral QAC breakfast  . loratadine  10 mg Oral Daily  . metoprolol succinate  12.5 mg Oral Daily  . pravastatin  40 mg Oral QPM  . pregabalin  25 mg Oral BID   Continuous Infusions: . sodium chloride 100 mL/hr (05/20/14 0104)   PRN Meds:.acetaminophen, HYDROcodone-acetaminophen, RESOURCE THICKENUP CLEAR, senna-docusate   Antibiotics   Anti-infectives    Start     Dose/Rate Route Frequency Ordered Stop   05/19/14 1100  cefTRIAXone (ROCEPHIN) 1 g in dextrose 5 % 50 mL IVPB - Premix     1 g 100 mL/hr over 30 Minutes Intravenous Every 24 hours 05/19/14 0931          Subjective:   Krista Ellis was seen and examined today.  Patient denies dizziness, chest pain, shortness of breath, abdominal pain, N/V/D/C, new weakness, numbess, tingling. During my initial examination, patient was completely fine with no aphasia, hungry and asking for food. However 5 minutes later while I was sitting outside to the patient's room, I heard the daughter about patient's another episode of aphasia. During my examination patient had no generalized or focal tremors or seizures however patient had difficulty speaking. Otherwise neuro examination was essentially normal.  Objective:   Blood pressure 137/52, pulse 58, temperature 98.1 F (36.7 C), temperature source Oral, resp. rate 16, height 5\' 5"  (1.651 m), weight 50.803 kg (112 lb), SpO2 94 %.  Wt Readings from Last 3 Encounters:  05/20/14 50.803 kg (112 lb)  05/15/14 50.916 kg  (112 lb 4 oz)  05/06/14 50.6 kg (111 lb 8.8 oz)     Intake/Output Summary (Last 24 hours) at 05/20/14 1159 Last data filed at 05/20/14 0500  Gross per 24 hour  Intake 748.33 ml  Output    550 ml  Net 198.33 ml    Exam  General: Alert and oriented x 3, NAD, aphasia  HEENT:  PERRLA, EOMI, Anicteic Sclera, mucous membranes moist.   Neck: Supple, no JVD, no masses  CVS: S1 S2 auscultated, no rubs, 3/6 midsystolic murmur at left sternal border Regular rate and rhythm.  Respiratory: Clear to auscultation bilaterally, no wheezing, rales or rhonchi  Abdomen: Soft, nontender, nondistended, + bowel sounds  Ext: no cyanosis clubbing or edema  Neuro: AAOx3, Cr N's II- XII. Strength 4/5 upper and lower extremities bilaterally, aphasia bilateral upper extremity  sensation decreased  Gait not tested  Skin: No rashes  Psych: Normal affect and demeanor, alert and oriented x3    Data Review   Micro Results No results found for this or any previous visit (from the past 240 hour(s)).  Radiology Reports Ct Head Wo Contrast  05/18/2014   CLINICAL DATA:  Aphasia and dysphagia.  Followup subdural hematoma.  EXAM: CT HEAD WITHOUT CONTRAST  TECHNIQUE: Contiguous axial images were obtained from the base of the skull through the vertex without intravenous contrast.  COMPARISON:  CT head 05/06/2014  FINDINGS: Resolving right-sided subdural hematoma. This measures approximately 4 mm in thickness and has become less dense compared with the prior study. No new area of hemorrhage  Generalized atrophy. Chronic ischemic changes throughout the white matter and centrum semiovale on the left unchanged. No acute infarct. Negative for mass lesion. No shift of the midline structures.  Arterial calcification.  Negative for skull fracture  IMPRESSION: Expected evolutionary change in right-sided subdural hematoma which is 4 mm in thickness with progression to lower density fluid.  Atrophy and chronic microvascular  ischemia. No superimposed acute abnormality.   Electronically Signed   By: Franchot Gallo M.D.   On: 05/18/2014 14:58   Ct Head Wo Contrast  05/06/2014   CLINICAL DATA:  Followup subdural hematoma.  EXAM: CT HEAD WITHOUT CONTRAST  TECHNIQUE: Contiguous axial images were obtained from the base of the skull through the vertex without intravenous contrast.  COMPARISON:  05/05/2014  FINDINGS: Skull and Sinuses:Mild right parietal scalp swelling.  No fracture.  Orbits: Bilateral cataract resection.  No traumatic findings  Brain: Stable 4 mm subdural hematoma around the right frontal operculum without mass effect. No new site of hemorrhage is detected.  Remote small vessel infarct involving the left corona radiata. Mild small vessel ischemic gliosis around the lateral ventricles. There is cerebral volume loss which is typical for age.  No evidence of acute infarct, mass lesion, or hydrocephalus.  IMPRESSION: Stable 4 mm subdural hematoma in the right frontal region.   Electronically Signed   By: Monte Fantasia M.D.   On: 05/06/2014 06:15   Mr Jodene Nam Head Wo Contrast  05/18/2014   ADDENDUM REPORT: 05/18/2014 18:07  ADDENDUM: Possible small amount of subarachnoid hemorrhage deep to the subdural hematoma.   Electronically Signed   By: Logan Bores   On: 05/18/2014 18:07   05/18/2014   CLINICAL DATA:  Right-sided subdural hematoma on CT. Transient right-sided facial droop and aphasia.  EXAM: MRI HEAD WITHOUT CONTRAST  MRA HEAD WITHOUT CONTRAST  TECHNIQUE: Multiplanar, multiecho pulse sequences of the brain and surrounding structures were obtained without intravenous contrast. Angiographic images of the head were obtained using MRA technique without contrast.  COMPARISON:  Head CT 05/18/2014. Brain MRI 06/24/2007. CTA neck 04/14/2007. Neck MRA 03/19/2007.  FINDINGS: MRI HEAD FINDINGS  Limited evaluation of the upper cervical spine demonstrates advanced multilevel disc degeneration in the mid cervical spine with  anterolisthesis of C3 on C4, more fully evaluated on recent cervical spine CT.  Small right frontotemporal subdural hematoma demonstrates T1 and T2 hyperintensity and measures up to 6 mm in thickness without significant mass effect. FLAIR hyperintensity in a few adjacent right frontal sulci may represent a tiny amount of subarachnoid hemorrhage or artifact. There is no evidence of acute infarct, parenchymal hemorrhage, mass, or midline shift. There is mild cerebral atrophy. A small, chronic infarct is noted in the left lateral lenticulostriate territory. Foci of T2 hyperintensity elsewhere in the cerebral white  matter bilaterally are nonspecific but compatible with mild chronic small vessel ischemic disease, slightly progressed from the prior MRI. There is also an unchanged, tiny chronic infarct in the right paracentral pons.  Prior bilateral cataract extraction is noted. Paranasal sinuses are clear. There are trace bilateral mastoid effusions. Major intracranial vascular flow voids are preserved.  MRA HEAD FINDINGS  Visualized distal vertebral arteries are patent and codominant. There is a mild to moderate distal right vertebral artery stenosis, increased from prior CTA. PICA, AICA, and SCA origins are patent. Mild mid basilar artery narrowing is unchanged. Communicating arteries are not identified. P1 segments are patent without stenosis. There is a mild proximal right P2 stenosis, and there is mild-to-moderate PCA irregularity bilaterally more distally.  Internal carotid arteries are patent from skullbase to carotid termini. Irregularity and mild narrowing near the anterior genu of the carotids bilaterally is similar to the prior MRA. Right ACA is dominant. There is mild left A1 segment irregularity diffusely with at most mild narrowing proximally. MCAs are patent without significant stenosis. No intracranial aneurysm is identified.  IMPRESSION: 1. Small, subacute right frontotemporal subdural hematoma without  mass effect, unchanged from CT earlier today. 2. No acute infarct. 3. Mild chronic small vessel ischemic disease. Chronic infarcts in the left basal ganglia region and pons. 4. No major intracranial arterial occlusion. 5. Mild-to-moderate distal right vertebral artery stenosis, increased from prior neck CTA. 6. Unchanged, mild mid basilar artery stenosis. 7. Mild stenosis of the proximal right P2 PCA and bilateral cavernous carotids.  Electronically Signed: By: Logan Bores On: 05/18/2014 18:02   Mr Brain Wo Contrast  05/18/2014   ADDENDUM REPORT: 05/18/2014 18:07  ADDENDUM: Possible small amount of subarachnoid hemorrhage deep to the subdural hematoma.   Electronically Signed   By: Logan Bores   On: 05/18/2014 18:07   05/18/2014   CLINICAL DATA:  Right-sided subdural hematoma on CT. Transient right-sided facial droop and aphasia.  EXAM: MRI HEAD WITHOUT CONTRAST  MRA HEAD WITHOUT CONTRAST  TECHNIQUE: Multiplanar, multiecho pulse sequences of the brain and surrounding structures were obtained without intravenous contrast. Angiographic images of the head were obtained using MRA technique without contrast.  COMPARISON:  Head CT 05/18/2014. Brain MRI 06/24/2007. CTA neck 04/14/2007. Neck MRA 03/19/2007.  FINDINGS: MRI HEAD FINDINGS  Limited evaluation of the upper cervical spine demonstrates advanced multilevel disc degeneration in the mid cervical spine with anterolisthesis of C3 on C4, more fully evaluated on recent cervical spine CT.  Small right frontotemporal subdural hematoma demonstrates T1 and T2 hyperintensity and measures up to 6 mm in thickness without significant mass effect. FLAIR hyperintensity in a few adjacent right frontal sulci may represent a tiny amount of subarachnoid hemorrhage or artifact. There is no evidence of acute infarct, parenchymal hemorrhage, mass, or midline shift. There is mild cerebral atrophy. A small, chronic infarct is noted in the left lateral lenticulostriate territory. Foci  of T2 hyperintensity elsewhere in the cerebral white matter bilaterally are nonspecific but compatible with mild chronic small vessel ischemic disease, slightly progressed from the prior MRI. There is also an unchanged, tiny chronic infarct in the right paracentral pons.  Prior bilateral cataract extraction is noted. Paranasal sinuses are clear. There are trace bilateral mastoid effusions. Major intracranial vascular flow voids are preserved.  MRA HEAD FINDINGS  Visualized distal vertebral arteries are patent and codominant. There is a mild to moderate distal right vertebral artery stenosis, increased from prior CTA. PICA, AICA, and SCA origins are patent. Mild mid basilar  artery narrowing is unchanged. Communicating arteries are not identified. P1 segments are patent without stenosis. There is a mild proximal right P2 stenosis, and there is mild-to-moderate PCA irregularity bilaterally more distally.  Internal carotid arteries are patent from skullbase to carotid termini. Irregularity and mild narrowing near the anterior genu of the carotids bilaterally is similar to the prior MRA. Right ACA is dominant. There is mild left A1 segment irregularity diffusely with at most mild narrowing proximally. MCAs are patent without significant stenosis. No intracranial aneurysm is identified.  IMPRESSION: 1. Small, subacute right frontotemporal subdural hematoma without mass effect, unchanged from CT earlier today. 2. No acute infarct. 3. Mild chronic small vessel ischemic disease. Chronic infarcts in the left basal ganglia region and pons. 4. No major intracranial arterial occlusion. 5. Mild-to-moderate distal right vertebral artery stenosis, increased from prior neck CTA. 6. Unchanged, mild mid basilar artery stenosis. 7. Mild stenosis of the proximal right P2 PCA and bilateral cavernous carotids.  Electronically Signed: By: Logan Bores On: 05/18/2014 18:02    CBC  Recent Labs Lab 05/15/14 1344 05/18/14 1412  05/18/14 1424  WBC 5.9 5.6  --   HGB 13.5 12.7 15.0  HCT 40.6 40.3 44.0  PLT 273.0 217  --   MCV 95.3 98.5  --   MCH  --  31.1  --   MCHC 33.3 31.5  --   RDW 12.8 12.6  --   LYMPHSABS 2.2 2.3  --   MONOABS 0.6 0.6  --   EOSABS 0.1 0.1  --   BASOSABS 0.0 0.0  --     Chemistries   Recent Labs Lab 05/15/14 1344 05/18/14 1412 05/18/14 1424  NA 134* 137 137  K 4.6 4.9 4.9  CL 104 104 103  CO2 27 26  --   GLUCOSE 105* 107* 108*  BUN 16 18 24*  CREATININE 0.61 0.79 0.80  CALCIUM 9.8 9.7  --   AST 18 24  --   ALT 8 12  --   ALKPHOS 46 44  --   BILITOT 0.5 0.7  --    ------------------------------------------------------------------------------------------------------------------ estimated creatinine clearance is 44.2 mL/min (by C-G formula based on Cr of 0.8). ------------------------------------------------------------------------------------------------------------------  Recent Labs  05/19/14 0435  HGBA1C 5.5   ------------------------------------------------------------------------------------------------------------------  Recent Labs  05/19/14 0435  CHOL 127  HDL 36*  LDLCALC 77  TRIG 69  CHOLHDL 3.5   ------------------------------------------------------------------------------------------------------------------ No results for input(s): TSH, T4TOTAL, T3FREE, THYROIDAB in the last 72 hours.  Invalid input(s): FREET3 ------------------------------------------------------------------------------------------------------------------ No results for input(s): VITAMINB12, FOLATE, FERRITIN, TIBC, IRON, RETICCTPCT in the last 72 hours.  Coagulation profile  Recent Labs Lab 05/18/14 1412  INR 0.98    No results for input(s): DDIMER in the last 72 hours.  Cardiac Enzymes No results for input(s): CKMB, TROPONINI, MYOGLOBIN in the last 168 hours.  Invalid input(s):  CK ------------------------------------------------------------------------------------------------------------------ Invalid input(s): POCBNP  No results for input(s): GLUCAP in the last 72 hours.   Jamyah Folk M.D. [facility] 05/20/2014, 11:59 AM  Pager: [phone]   Between 7am to 7pm - call Pager - [phone]  After 7pm go to [email or web address] - password [email or web address]  Call night coverage person covering after 7pm

## 2014-05-20 NOTE — Progress Notes (Signed)
Called to patient's room that she was seizing. On assessment, there were no visible movement noted, but patient unable to make out words. MD on the unit and came to see the patient. MD notified neurologist. Vital signs within normal limits. Will continue to monitor.

## 2014-05-20 NOTE — Clinical Social Work Note (Addendum)
Clinical Social Work Assessment  Patient Details  Name: Krista Ellis MRN: [id number] Date of Birth: Jul 03, 1930  Date of referral:  05/19/14               Reason for consult:  Facility Placement                Permission sought to share information with:  Family Supports Permission granted to share information::  Yes, Verbal Permission Granted  Name::     Animal nutritionist::     Relationship::  Daughter  Contact Information:  [phone]  Housing/Transportation Living arrangements for the past 2 months:  Single Family Home Source of Information:  Patient, Adult Children, Friend/Neighbor Patient Interpreter Needed:  None Criminal Activity/Legal Involvement Pertinent to Current Situation/Hospitalization:  No - Comment as needed Significant Relationships:  Adult Children, Friend Lives with:  Self Do you feel safe going back to the place where you live?  Yes Need for family participation in patient care:  Yes (Comment)  Care giving concerns:  Patient cared for herself at home and patient is concerned that without rehabilitaiton she will not be able to return to her usual state of independent living.    Social Worker assessment / plan: Patient from [facility] and desires to go into to skilled care at [facility] or [facility]. Patient and daughter agreed to patient being faxed out to [facility]. Patient looking forward to rehabilitation so that she can return home to her previous level of independence.    Employment status:  Retired Health visitor, Managed Care PT Recommendations:  [place] / Referral to community resources:  [place]  Patient/Family's Response to care: Patient and family agreeable to placement but are very clear that they desire for patient to be placed directly from the hospital.   Patient/Family's Understanding of and Emotional Response to Diagnosis, Current Treatment, and Prognosis:   Family is encouraged and supportive.   Emotional Assessment Appearance:  Appears stated age Attitude/Demeanor/Rapport:   (WNL) Affect (typically observed):   (WNL) Orientation:  Oriented to Self, Oriented to Place, Oriented to  Time, Oriented to Situation Alcohol / Substance use:  Never Used Psych involvement (Current and /or in the community):  No (Comment)  Discharge Needs  Concerns to be addressed:  Denies Needs/Concerns at this time Readmission within the last 30 days:  Yes Current discharge risk:  None Barriers to Discharge:  No SNF bed   Nahomy Limburg J, LCSW 05/20/2014, 5:45 PM

## 2014-05-20 NOTE — Evaluation (Signed)
Occupational Therapy Evaluation Patient Details Name: Krista Ellis MRN: [id number] DOB: Apr 05, 1930 Today's Date: 05/20/2014    History of Present Illness 81 y.o. female was admitted for TIA with History of subdural hematoma, MI, CVA, and PVD.   Clinical Impression   PT admitted with R SDH with R facial droop and slurred speech. Pt currently with functional limitiations due to the deficits listed below (see OT problem list). Pt is from home with caregiver (A). Pt however will be alone for a good portion of the day. Pt is not safe to complete transfers without (A) or stay alone at this time.  Pt will benefit from skilled OT to increase their independence and safety with adls and balance to allow discharge SNF. Pt will require 24/7 (A) that is not currently in place.     Follow Up Recommendations  SNF;Supervision/Assistance - 24 hour    Equipment Recommendations  Other (comment) (defer to sNF)    Recommendations for Other Services       Precautions / Restrictions Precautions Precautions: Fall Precaution Comments: L shoulder injury / subluxation      Mobility Bed Mobility Overal bed mobility: Needs Assistance Bed Mobility: Supine to Sit     Supine to sit: Mod assist;HOB elevated     General bed mobility comments: HOB 50 degrees and required mod - MAx (A)  Transfers Overall transfer level: Needs assistance Equipment used: Rolling walker (2 wheeled) Transfers: Sit to/from Stand Sit to Stand: Mod assist         General transfer comment: Pt needed cues for hand placement and to power up into standing. pt attempting to step with on R LE and L LE lacking back    Balance Overall balance assessment: Needs assistance Sitting-balance support: Bilateral upper extremity supported;Feet supported Sitting balance-Leahy Scale: Zero   Postural control: Posterior lean Standing balance support: Bilateral upper extremity supported;During functional activity Standing  balance-Leahy Scale: Poor                              ADL Overall ADL's : Needs assistance/impaired Eating/Feeding: Set up;Sitting Eating/Feeding Details (indicate cue type and reason): OT arriving to help reposition patient for SLP swallow evaluation                 Lower Body Dressing: Moderate assistance;Bed level (don shoes)   Toilet Transfer: Moderate assistance;Stand-pivot Toilet Transfer Details (indicate cue type and reason): pt requires cues for hand placement and to shift weight due to L hip pain           General ADL Comments: Pt supine on arrival and requires (A) To exit the bed. pt required use of pad behind shoulders to help bring to EOB sitting. pt unable to maintain EOB sitting. pt with posterior LOB. pt stand pivot to the R side to chair. pt required pillows on L side to prevent lateral lean L .      Vision Additional Comments: Ot to further assess next visit   Perception     Praxis      Pertinent Vitals/Pain Pain Assessment: Faces Faces Pain Scale: Hurts even more Pain Location: L hip Pain Descriptors / Indicators: Discomfort Pain Intervention(s): Repositioned     Hand Dominance Left   Extremity/Trunk Assessment Upper Extremity Assessment Upper Extremity Assessment: LUE deficits/detail LUE Deficits / Details: previous shoulder surg / pt reports "they didnt do therapy back then" AAROM 70 degrees   Lower Extremity Assessment Lower  Extremity Assessment: Defer to PT evaluation   Cervical / Trunk Assessment Cervical / Trunk Assessment: Kyphotic   Communication Communication Communication: No difficulties   Cognition Arousal/Alertness: Awake/alert Behavior During Therapy: WFL for tasks assessed/performed Overall Cognitive Status: Within Functional Limits for tasks assessed                     General Comments       Exercises       Shoulder Instructions      Home Living Family/patient expects to be discharged to::  Private residence Living Arrangements: Alone Available Help at Discharge: Family;Personal care attendant Type of Home: House Home Access: Stairs to enter [employer] of Steps: 4 Entrance Stairs-Rails: Right;Left Home Layout: One level               Home Equipment: Bedside commode;Walker - 2 wheels          Prior Functioning/Environment Level of Independence: Needs assistance  Gait / Transfers Assistance Needed: RW for gait ADL's / Homemaking Assistance Needed: Assist to get out of bed, bath, dress.        OT Diagnosis: Generalized weakness;Acute pain   OT Problem List: Decreased strength;Decreased activity tolerance;Impaired balance (sitting and/or standing);Decreased safety awareness;Decreased knowledge of use of DME or AE;Decreased knowledge of precautions;Impaired UE functional use;Pain   OT Treatment/Interventions: Self-care/ADL training;Therapeutic exercise;Neuromuscular education;DME and/or AE instruction;Therapeutic activities;Patient/family education;Balance training    OT Goals(Current goals can be found in the care plan section) Acute Rehab OT Goals Patient Stated Goal: to sit up to eat OT Goal Formulation: With patient/family Time For Goal Achievement: 06/03/14 Potential to Achieve Goals: Good  OT Frequency: Min 2X/week   Barriers to D/C:            Co-evaluation              End of Session Equipment Utilized During Treatment: Gait belt;Rolling walker Nurse Communication: Mobility status;Precautions  Activity Tolerance: Patient tolerated treatment well Patient left: in chair;with call bell/phone within reach;with family/visitor present   Time: 2119-4174 OT Time Calculation (min): 8 min Charges:  OT General Charges $OT Visit: 1 Procedure OT Evaluation $Initial OT Evaluation Tier I: 1 Procedure G-Codes:    Parke Poisson B 06/15/14, 11:00 AM  Pager: [phone]

## 2014-05-20 NOTE — Clinical Social Work Placement (Signed)
   CLINICAL SOCIAL WORK PLACEMENT  NOTE  Date:  05/20/2014  Patient Details  Name: Krista Ellis MRN: [id number] Date of Birth: August 11, 1930  Clinical Social Work is seeking post-discharge placement for this patient at the [place] level of care (*CSW will initial, date and re-position this form in  chart as items are completed):  No (Family was familiar with facilities in thier area and very specific about the facility they desired for the patient to go to. )   Patient/family provided with [place] Work Department's list of facilities offering this level of care within the geographic area requested by the patient (or if unable, by the patient's family).  Yes   Patient/family informed of their freedom to choose among providers that offer the needed level of care, that participate in Medicare, Medicaid or managed care program needed by the patient, have an available bed and are willing to accept the patient.  No   Patient/family informed of [facility]'s ownership interest in [facility] and [facility], as well as of the fact that they are under no obligation to receive care at these facilities.  PASRR submitted to EDS on 05/20/14     PASRR number received on       Existing PASRR number confirmed on       FL2 transmitted to all facilities in geographic area requested by pt/family on 05/20/14     FL2 transmitted to all facilities within larger geographic area on 05/20/14     Patient informed that his/her managed care company has contracts with or will negotiate with certain facilities, including the following:            Patient/family informed of bed offers received.  Patient chooses bed at       Physician recommends and patient chooses bed at      Patient to be transferred to   on  .  Patient to be transferred to facility by       Patient family notified on   of transfer.  Name of family member notified:        PHYSICIAN        Additional Comment:    _______________________________________________ Christene Lye, LCSW 05/20/2014, 6:09 PM

## 2014-05-20 NOTE — Progress Notes (Signed)
Patient having another episode of left facial droop and slurred speech. VSS, neuro NIH increased d/t slurred speech. Attending and neurology notified and aware. Will continue to monitor closely.

## 2014-05-20 NOTE — Evaluation (Signed)
Clinical/Bedside Swallow Evaluation Patient Details  Name: Krista Ellis MRN: [id number] Date of Birth: 04/02/1930  Today's Date: 05/20/2014 Time: SLP Start Time (ACUTE ONLY): 1045 SLP Stop Time (ACUTE ONLY): 1115 SLP Time Calculation (min) (ACUTE ONLY): 30 min  Past Medical History:  Past Medical History  Diagnosis Date  . Hypertension   . Hyperlipidemia   . Arthritis   . Hemihypertrophy   . Myocardial infarction   . Thyroid disease   . CVA (cerebral infarction) 2003  . Peripheral vascular disease     s/p stent right leg  . Pulmonary nodule     stable on Chest CT March 2014, Followed at [facility]  . Murmur, cardiac    Past Surgical History:  Past Surgical History  Procedure Laterality Date  . Abdominal hysterectomy    . Total hip arthroplasty      right  . Spine surgery      steel rod in back   HPI:  81 yo female adm to [facility] with speech slurred and dysphagia. 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 MRI showed ? small amount of SAH, chronic right basal ganglia and paracentral pons CVA. Pt admits to poor appetite x1 month but denies dysphagia being source of poor intake. Repeat orders were recieved due to change in the patient's status.  She had a seizure.  Following the seizure the pt presented with slurred speech.  Given this the pt was made NPO and repeat swallow orders were entered.     Assessment / Plan / Recommendation Clinical Impression  Clinical evaluation of swallowing was completed.  The pt presents with mild oral and pharyngeal dysphagia.  Oral deficits c/b delayed oral transit for solids and mild lingual residue on the left with dry solids.  Pharyngeal deficits c/b a slightly delayed swallow trigger with coughing noted for thin liquids via cup sips.  Given clinical presenation and pt's current status rx a dysphagia 3 diet with nectar liquids  pending MBS in 2 days.  Pt and family in agreement.      Aspiration Risk  Mild    Diet Recommendation Dysphagia 3 (Mech soft) (nectar liquids)   Medication Administration: Whole meds with puree Compensations: Slow rate;Small sips/bites;Check for pocketing    Other  Recommendations Oral Care Recommendations: Oral care BID Other Recommendations: Remove water pitcher    Swallow Study Prior Functional Status  Type of Home: House Available Help at Discharge: Family;Personal care attendant    General Date of Onset: 05/19/14 Other Pertinent Information: 81 yo female adm to [facility] with speech slurred and dysphagia. 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 MRI showed ? small amount of SAH, chronic right basal ganglia and paracentral pons CVA. Pt admits to poor appetite x1 month but denies dysphagia being source of poor intake. Repeat orders were recieved due to change in the patient's status.  She had a seizure.  Following the seizure the pt presented with slurred speech.  Given this the pt was made NPO and repeat swallow orders were entered.   Type of Study: Bedside pediatric feeding/swallowing evaluation Previous Swallow Assessment: 05/19/14 Diet Prior to this Study: Dysphagia 3 (soft);Thin liquids Temperature Spikes Noted: No Respiratory Status: Supplemental O2 delivered via (comment) History of  Recent Intubation: No Behavior/Cognition: Alert;Cooperative;Pleasant mood Oral Cavity - Dentition:  (Full plate on top and partial on the bottom.  ) Self-Feeding Abilities: Able to feed self Patient Positioning: Upright in chair/[name] Baseline Vocal Quality: Low vocal intensity Volitional Cough: Weak Volitional Swallow: Able to elicit    Oral/Motor/Sensory Function Overall Oral Motor/Sensory Function: Impaired Labial ROM: Reduced left Labial Symmetry: Abnormal symmetry  left Lingual ROM: Within Functional Limits Lingual Symmetry: Within Functional Limits Lingual Strength: Reduced Facial ROM: Reduced left Facial Symmetry: Left droop Mandible: Within Functional Limits   Ice Chips Ice chips: Not tested   Thin Liquid Thin Liquid: Impaired Presentation: Cup;Self Fed;Spoon Pharyngeal  Phase Impairments: Suspected delayed Swallow;Decreased hyoid-laryngeal movement;Cough - Immediate (given cup sips - was not seen given tsp sips)    Nectar Thick Nectar Thick Liquid: Within functional limits Presentation: Cup;Self Fed   Honey Thick Honey Thick Liquid: Not tested   Puree Puree: Impaired Presentation: Self Fed;Spoon Oral Phase Functional Implications: Prolonged oral transit   Solid   GO    Solid: Impaired Presentation: Self Fed Oral Phase Impairments: Reduced lingual movement/coordination;Impaired anterior to posterior transit;Impaired mastication Oral Phase Functional Implications: Oral residue (mild oral residue left side of tongue)       Shelly Flatten N 05/20/2014,11:28 AM  Shelly Flatten, MA, [place] [facility] SLP [phone]

## 2014-05-21 DIAGNOSIS — E440 Moderate protein-calorie malnutrition: Secondary | ICD-10-CM | POA: Insufficient documentation

## 2014-05-21 DIAGNOSIS — N3001 Acute cystitis with hematuria: Secondary | ICD-10-CM

## 2014-05-21 LAB — BASIC METABOLIC PANEL
Anion gap: 7 (ref 5–15)
BUN: 8 mg/dL (ref 6–20)
CHLORIDE: 108 mmol/L (ref 101–111)
CO2: 25 mmol/L (ref 22–32)
Calcium: 9 mg/dL (ref 8.9–10.3)
Creatinine, Ser: 0.51 mg/dL (ref 0.44–1.00)
GFR calc Af Amer: >60 mL/min (ref >60)
GFR calc non Af Amer: >60 mL/min (ref >60)
GLUCOSE: 86 mg/dL (ref 70–99)
POTASSIUM: 3.9 mmol/L (ref 3.5–5.1)
SODIUM: 140 mmol/L (ref 135–145)

## 2014-05-21 LAB — URINE CULTURE
COLONY COUNT: NO GROWTH
Culture: NO GROWTH

## 2014-05-21 MED ORDER — MICONAZOLE NITRATE 2 % EX CREA
TOPICAL_CREAM | Freq: Two times a day (BID) | CUTANEOUS | Status: DC
  Administered 2014-05-21 – 2014-05-23 (×5): via TOPICAL
  Administered 2014-05-24: 1 via TOPICAL
  Administered 2014-05-24: via TOPICAL
  Filled 2014-05-21: qty 14

## 2014-05-21 MED ORDER — PHENAZOPYRIDINE HCL 100 MG PO TABS
100.0000 mg | ORAL_TABLET | Freq: Three times a day (TID) | ORAL | Status: DC
  Administered 2014-05-21 – 2014-05-23 (×8): 100 mg via ORAL
  Filled 2014-05-21 (×11): qty 1

## 2014-05-21 MED ORDER — WHITE PETROLATUM GEL
Status: AC
  Administered 2014-05-21: 0.2
  Filled 2014-05-21: qty 1

## 2014-05-21 MED ORDER — SODIUM CHLORIDE 0.9 % IV SOLN
500.0000 mg | Freq: Once | INTRAVENOUS | Status: DC
  Filled 2014-05-21: qty 5

## 2014-05-21 MED ORDER — LEVETIRACETAM 500 MG/5ML IV SOLN
1500.0000 mg | Freq: Two times a day (BID) | INTRAVENOUS | Status: DC
  Administered 2014-05-21 – 2014-05-24 (×7): 1500 mg via INTRAVENOUS
  Filled 2014-05-21 (×8): qty 15

## 2014-05-21 NOTE — Progress Notes (Signed)
Patient has complaints of pain with urination, tenderness at suprapubic area, and observed blood in urine.  MD on-call was notified received orders see MAR.  Will continue to monitor.

## 2014-05-21 NOTE — Progress Notes (Signed)
Patient had some seizure activity this morning lasted several minutes during time patient was aware of seizure, but unable to communicate even tried to speak and was frustrated that she couldn't express herself.  No tremors observed was looking at nurse while trying to communicate.  Shortly there after patient was able to talk again. Notified MD on call. On-coming nurse aware and awaiting neurologist for any orders.

## 2014-05-21 NOTE — Progress Notes (Signed)
STROKE TEAM PROGRESS NOTE   HISTORY JOURDYN HASLER is an 81 y.o. 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 Patient was with her family member today when she was noted to have Left facial droop, difficulty expressing herself and per family, sounded as her tongue was thick. Per family this only lasted for a few minutes and then fully resolved. Currently she is back to her baseline.  This AM another episode of L facial droop and slurry speech similar to yesterday. Her Keppra dose was increased.    Date last known well: Date: 05/18/2014 Time last known well: Time: 13:00 tPA Given: No: symptoms resolved Modified Rankin: Rankin Score=3   SUBJECTIVE (INTERVAL HISTORY) This AM another episode of slurry speech and L facial droop.  Keppra has been increased. Pt now back to baseline.    OBJECTIVE Temp:  [97.4 F (36.3 C)-98.3 F (36.8 C)] 98 F (36.7 C) (05/01 1013) Pulse Rate:  [53-75] 67 (05/01 1013) Cardiac Rhythm:  [-] Normal sinus rhythm (04/30 2030) Resp:  [16-20] 20 (05/01 1013) BP: (110-166)/(41-55) 148/55 mmHg (05/01 1013) SpO2:  [94 %-100 %] 97 % (05/01 1013)  No results for input(s): GLUCAP in the last 168 hours.  Recent Labs Lab 05/15/14 1344 05/18/14 1412 05/18/14 1424 05/20/14 1156 05/21/14 0825  NA 134* 137 137 137 140  K 4.6 4.9 4.9 3.9 3.9  CL 104 104 103 105 108  CO2 27 26  --  23 25  GLUCOSE 105* 107* 108* 117* 86  BUN 16 18 24* 12 8  CREATININE 0.61 0.79 0.80 0.59 0.51  CALCIUM 9.8 9.7  --  8.9 9.0    Recent Labs Lab 05/15/14 1344 05/18/14 1412 05/20/14 1156  AST 18 24 20   ALT 8 12 9   ALKPHOS 46 44 40  BILITOT 0.5 0.7 0.5  PROT 7.1 7.0 6.0  ALBUMIN 3.6 3.4* 2.9*    Recent Labs Lab 05/15/14 1344 05/18/14 1412 05/18/14 1424  WBC 5.9 5.6  --   NEUTROABS 3.0 2.6  --   HGB 13.5 12.7 15.0  HCT 40.6 40.3 44.0  MCV 95.3 98.5  --   PLT 273.0 217  --    No results for input(s): CKTOTAL, CKMB, CKMBINDEX, TROPONINI  in the last 168 hours.  Recent Labs  05/18/14 1412  LABPROT 13.1  INR 0.98    Recent Labs  05/18/14 1750 05/20/14 0225  COLORURINE YELLOW AMBER*  LABSPEC 1.011 1.020  PHURINE 6.5 5.5  GLUCOSEU NEGATIVE NEGATIVE  HGBUR SMALL* LARGE*  BILIRUBINUR NEGATIVE NEGATIVE  KETONESUR NEGATIVE NEGATIVE  PROTEINUR NEGATIVE NEGATIVE  UROBILINOGEN 1.0 1.0  NITRITE NEGATIVE NEGATIVE  LEUKOCYTESUR SMALL* MODERATE*       Component Value Date/Time   CHOL 127 05/19/2014 0435   TRIG 69 05/19/2014 0435   HDL 36* 05/19/2014 0435   CHOLHDL 3.5 05/19/2014 0435   VLDL 14 05/19/2014 0435   LDLCALC 77 05/19/2014 0435   Lab Results  Component Value Date   HGBA1C 5.5 05/19/2014      Component Value Date/Time   LABOPIA POSITIVE* 05/18/2014 1750   COCAINSCRNUR NONE DETECTED 05/18/2014 1750   LABBENZ NONE DETECTED 05/18/2014 1750   AMPHETMU NONE DETECTED 05/18/2014 1750   THCU NONE DETECTED 05/18/2014 1750   LABBARB NONE DETECTED 05/18/2014 1750    No results for input(s): ETH in the last 168 hours.  I have personally reviewed the radiological images below and agree with the radiology interpretations.  Ct Head Wo  Contrast 05/18/2014    Expected evolutionary change in right-sided subdural hematoma which is 4 mm in thickness with progression to lower density fluid.  Atrophy and chronic microvascular ischemia. No superimposed acute abnormality.     Mr Virgel Paling Wo Contrast 05/18/2014   ADDENDUM: Possible small amount of subarachnoid hemorrhage deep to the subdural hematoma.    1. Small, subacute right frontotemporal subdural hematoma without mass effect, unchanged from CT earlier today. 2. No acute infarct.  3. Mild chronic small vessel ischemic disease. Chronic infarcts in the left basal ganglia region and pons.  4. No major intracranial arterial occlusion.  5. Mild-to-moderate distal right vertebral artery stenosis, increased from prior neck CTA.  6. Unchanged, mild mid basilar artery  stenosis.  7. Mild stenosis of the proximal right P2 PCA and bilateral cavernous carotids.    CUS - The right ICA Stent showed a mild amount of residual plaque with less than 50% stenosis. The left ICA showed a moderate amount of calcified plaque with 40-59% stenosis, and bilateral vertebral arteries are antegrade.  2D echo - - Left ventricle: The cavity size was normal. Wall thickness was increased in a pattern of mild LVH. Systolic function was vigorous. The estimated ejection fraction was in the range of 65% to 70%. Doppler parameters are consistent with abnormal left ventricular relaxation (grade 1 diastolic dysfunction). - Aortic valve: AV is thickened, calcified. Peak and mean gradients through the valve are 28 and 14 mm Hg respectively consistent with mild aortic stenosis. Valve area (VTI): 1.43 cm^2. Valve area (Vmax): 1.31 cm^2. Valve area (Vmean): 1.49 cm^2. - Pulmonary arteries: PA peak pressure: 32 mm Hg (S).  EEG - Impression: This awake and asleep EEG is mildly abnormal due to occasional diffuse delta slowing of the background.  Clinical Correlation of the above findings indicates mild diffuse cerebral dysfunction that is non-specific in etiology and may be seen with hypoxic/ischemic injury, toxic/metabolic encephalopathies, medication effect. It may be seen with excessive drowsiness as well. The absence of epileptiform discharges does not exclude a clinical diagnosis of epilepsy. Clinical correlation is advised.  PHYSICAL EXAM  Temp:  [97.4 F (36.3 C)-98.3 F (36.8 C)] 98 F (36.7 C) (05/01 1013) Pulse Rate:  [53-75] 67 (05/01 1013) Resp:  [16-20] 20 (05/01 1013) BP: (110-166)/(41-55) 148/55 mmHg (05/01 1013) SpO2:  [94 %-100 %] 97 % (05/01 1013)  General - Well nourished, well developed, in no apparent distress.  Ophthalmologic - fundi not visualized due to eye movement.  Cardiovascular - Regular rate and rhythm with no murmur.  Mental Status -   Level of arousal and orientation to time, place, and person were intact. Language including expression, naming, repetition, comprehension was assessed and found intact. Fund of Knowledge was assessed and was intact.  Cranial Nerves II - XII - II - Visual field intact OU. III, IV, VI - Extraocular movements intact. V - Facial sensation intact bilaterally. VII - Facial movement intact bilaterally. VIII - Hearing & vestibular intact bilaterally. X - Palate elevates symmetrically. XI - Chin turning & shoulder shrug intact bilaterally. XII - Tongue protrusion intact.  Motor Strength - The patient's strength was 4/5 in all extremities and pronator drift was absent.  Bulk was normal and fasciculations were absent.   Motor Tone - Muscle tone was assessed at the neck and appendages and was normal.  Reflexes - The patient's reflexes were 1+ in all extremities and she had no pathological reflexes.  Sensory - Light touch, temperature/pinprick were assessed and were  symmetrical. However, she stated that her bilateral UE sensation was equally decreased due to neuropathy.  Coordination - The patient had normal movements in the hands with no ataxia or dysmetria.  Tremor was absent.  Gait and Station - deferred due to safety concerns.  ASSESSMENT/PLAN Ms. BILL MCVEY is a 81 y.o. 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  She did not receive IV t-PA due to resolution of deficits.   Possible seizure episode due to right SDH and UTI - episodic left facial droop and slurry speech- another episode this AM.   Resultant back to baseline- Increased Keppra 1gm BID  MRI - Small, subacute right frontotemporal SDH and possible small amount of SAH deep to the SDH.  MRA - No  major intracranial arterial occlusion.   Carotid Doppler - right ICA stent 50% stenosis, left ICA 40-59%  2D Echo - unremarkable  LDL - 77  EEG - general slowing but no seizure. W ill hold off from repeating it.    SCDs for VTE prophylaxis  DIET DYS 3 Room service appropriate?: Yes; Fluid consistency:: Nectar Thick NPO  Recommend seizure prevention with keppra 500mg  bid, Now increase to 1500BID,  aspirin 81 mg orally every day prior to admission due to SDH, now on ASA 81mg  Ongoing aggressive stroke risk factor management  Therapy recommendations:  SNF  Disposition: Pending  S/p discussion w/ daughter and other family members at bedside.   UTI can lower sz threshold. Will place on con't EEG monitoring.    Right small SDH  Has been 2 weeks  On ASA 81mg  now  Plan to switch back to plavix tomorrow (ordered)  Likely the cause of possible seizrue  Recommend keppra 500mg  bid until sees in clinic in 2 months.  UTI  UA WBC 21-50  On rocephine  UTI could lower seizure threshold.  Continue management as per primary team  Hypertension  Home meds: Norvasc and Toprol XL  Stable  Patient counseled to be compliant with her blood pressure medications  Hyperlipidemia  Home meds: Pravachol 40 mg daily resumed in hospital  LDL 77, goal < 70  Continue statin at discharge  Diabetes  HgbA1c pending, goal < 7.0  Controlled  Other Active Problems  Mildly elevated BUN  Other Pertinent History  Neuropathy follow up with Dr. Brigitte Pulse in [place]  S/p discussion with daughter at [facility] day # 3  Leotis Pain   05/21/2014 11:23 AM    To contact Stroke Continuity provider, please refer to [email or web address] After hours, contact General Neurology

## 2014-05-21 NOTE — Progress Notes (Signed)
[facility]                                                                              Patient Demographics  Krista Ellis, is a 81 y.o. female, DOB - 10-Jan-1931, UJW:[id number]  Admit date - 05/18/2014   Admitting Physician Annita Brod, MD  Outpatient Primary MD for the patient is [name], MD  LOS - 3   Chief Complaint  Patient presents with  . Aphasia  . Dysphagia       Brief HPI   The patient is a 81 year old female patient recently discharged on 05/06/14 after an admission for a right sided subdural hematoma, presumed traumatic in etiology. Prior to admission patient had been on Plavix and this medicine was placed on hold last resume on 4/30. Patient presented to the ED with intermittent episodes of aphasia with associated inability to control oral secretions. These episodes had been occurring with no particular pattern of the past seven days lasting 1-5 minutes. Patient's daughter reported that, she had been having right supraorbital headache several days before she fell and was diagnosed with subdural hematoma. She denied any visual changes, facial drooping, numbness or tingling or weakness in any of the extremities. Her daughter at the bedside also noticed that the patient has had poor oral intake for at least one week. When questioned about rationale for Plavix, patient could not recall the exact diagnosis this was prescribed for and her daughter reports that they think the Plavix was started around 1991.  She was brought to the ER on 4/28 for left facial droop and difficulty expressing her self, slurred speech.  In the ER patient was afebrile and hemodynamically stable maintaining O2 saturations of 98% on room air. CT of the head without contrast revealed expected evolutionary change in the right-sided subdural hematoma which is 4 mm in thickness with progression to a lower density fluid. There was atrophy and chronic microvascular ischemia  without any superimposed acute abnormality. Neurology was consulted and patient was admitted for further workup. She has had further "episodes" of difficult with communication/ inability to talk and is being managed for possible seizures by the Neuro team.   Subjective:   I was called by the nurse this morning as patient had another spell of aphasia which she witnessed. Upon my evaluation the patient is back to baseline. She admits to severe pressure in the left groin and suprapubic area after which she feels the urge to urinate. While urinating she feels severe burning. Once she has urinated, left groin pain/pressure sensation resolves. She is voiding frequently, about every 2 hours. She has been told in the past that she had a left-sided kidney stone. She has no complaints of flank pain.  Assessment & Plan    TIA  - MRI/MRA showed small subacute right frontotemporal subdural hematoma and possible small amount of subarachnoid hemorrhage deep to the subdural hematoma without mass effect, no acute infarct - MRA showed mild to moderate distal right vertebral artery stenosis, increased from prior neck CTA, mild mid basilar artery stenosis, mild chronic small vessel ischemic disease. - 2-D echo showed EF of 65-70%, grade 1  diastolic dysfunction - EEG showed generalized slowing but no seizures  - Placed on aspirin 81 mg daily by neurology.  - PT, OT evaluation-> skilled nursing facility -Carotid Dopplers right ICA 50% stenosis, left ICA 40-59% - Passed a swallow testing-  placed on dysphagia 3 diet  Seizures - Patient had another episode of aphasia again this morning and yesterday morning- has been receiving Keppra 1000 BID - given a bolus of 500 mg of Keppra and increased dose to 1500 BID (Dr Nicole Kindred who was covering this AM) - stroke team to continue to manage- continuous EEG ordered today   Recurrent falls/known gait disturbance -PT evaluation recommended skilled nursing facility   History  of subdural hematoma - Continue to hold Plavix   Hypertension -Blood pressure currently well controlled -Continue preadmission Norvasc and Toprol   Hypothyroidism -Continue Synthroid   Osteoarthritis -Continue hydrocodone and Lyrica   Systolic murmur -2-D echo showed EF of 17-49%, grade 1 diastolic dysfunction, mild aortic stenosis   Dyslipidemia -Continue statin - Lipid panel showed LDL 77    UTI (urinary tract infection) - Urine culture reveals no growth-  placed on IV Rocephin- today is day 3 - Pyridium started overnight for "pressure" sensation-when further discussing with patient and her daughter, it appears that most of her pain is in the left groin and is intermittent-she has been told in the past that she had a left renal stone and it is possible the stone may be passing-they noted bloody urine yesterday but per nurse it is clear today -We'll continue to follow symptoms-will hold off on obtaining a CT of the abdomen pelvis to evaluate for nephrolithiasis unless symptoms persist-of note UA obtained on 4/30 did reveal a large amount of hemoglobin & 3-6 RBC -As UA was grossly positive for white blood cells and she is currently complaining of severe burning when she urinates, would recommend we continue to treat for UTI with Rocephin for now   Code Status: DO NOT RESUSCITATE  Family Communication: Discussed in detail with the patient, all imaging results, lab results explained to the patient and daughter   Disposition Plan: Need skilled nursing facility, daughter at the bedside requesting skilled nursing facility in [place]  Time Spent in minutes  25 minutes  Procedures  MRI, MRA, 2-D echo  Consults   Neurology   DVT Prophylaxis  SCDs   Medications  Scheduled Meds: . amLODipine  5 mg Oral Daily  . cefTRIAXone (ROCEPHIN)  IV  1 g Intravenous Q24H  . clopidogrel  75 mg Oral Daily  . feeding supplement (ENSURE ENLIVE)  237 mL Oral BID PC  . fluticasone  1  spray Each Nare Daily  . levETIRAcetam  1,500 mg Intravenous Q12H  . levothyroxine  25 mcg Oral QAC breakfast  . loratadine  10 mg Oral Daily  . metoprolol succinate  12.5 mg Oral Daily  . miconazole   Topical BID  . phenazopyridine  100 mg Oral TID WC  . pravastatin  40 mg Oral QPM  . pregabalin  25 mg Oral BID   Continuous Infusions: . sodium chloride 100 mL/hr at 05/21/14 0533   PRN Meds:.acetaminophen, HYDROcodone-acetaminophen, RESOURCE THICKENUP CLEAR, senna-docusate   Antibiotics   Anti-infectives    Start     Dose/Rate Route Frequency Ordered Stop   05/19/14 1100  cefTRIAXone (ROCEPHIN) 1 g in dextrose 5 % 50 mL IVPB - Premix     1 g 100 mL/hr over 30 Minutes Intravenous Every 24 hours 05/19/14 0931  Objective:   Blood pressure 140/49, pulse 65, temperature 97.7 F (36.5 C), temperature source Oral, resp. rate 18, height 5\' 5"  (1.651 m), weight 50.803 kg (112 lb), SpO2 95 %.  Wt Readings from Last 3 Encounters:  05/20/14 50.803 kg (112 lb)  05/15/14 50.916 kg (112 lb 4 oz)  05/06/14 50.6 kg (111 lb 8.8 oz)     Intake/Output Summary (Last 24 hours) at 05/21/14 0930 Last data filed at 05/21/14 0529  Gross per 24 hour  Intake    360 ml  Output      1 ml  Net    359 ml    Exam  General: Alert and oriented x 3, NAD  HEENT:  PERRLA, EOMI, Anicteic Sclera, mucous membranes moist.   Neck: Supple, no JVD, no masses  CVS: S1 S2 auscultated, no rubs, 2/6 systolic murmur at right upper sternal border Regular rate and rhythm.  Respiratory: Clear to auscultation bilaterally, no wheezing, rales or rhonchi  Abdomen: Soft, nontender, nondistended, + bowel sounds  Ext: no cyanosis clubbing or edema  Neuro: AAOx3, Cr N's II- XII. Strength 4/5 upper and lower extremities bilaterally- Gait not tested  Skin: No rashes  Psych: Normal affect and demeanor, alert and oriented x3    Data Review   Micro Results No results found for this or any previous visit  (from the past 240 hour(s)).  Radiology Reports Ct Head Wo Contrast  05/18/2014   CLINICAL DATA:  Aphasia and dysphagia.  Followup subdural hematoma.  EXAM: CT HEAD WITHOUT CONTRAST  TECHNIQUE: Contiguous axial images were obtained from the base of the skull through the vertex without intravenous contrast.  COMPARISON:  CT head 05/06/2014  FINDINGS: Resolving right-sided subdural hematoma. This measures approximately 4 mm in thickness and has become less dense compared with the prior study. No new area of hemorrhage  Generalized atrophy. Chronic ischemic changes throughout the white matter and centrum semiovale on the left unchanged. No acute infarct. Negative for mass lesion. No shift of the midline structures.  Arterial calcification.  Negative for skull fracture  IMPRESSION: Expected evolutionary change in right-sided subdural hematoma which is 4 mm in thickness with progression to lower density fluid.  Atrophy and chronic microvascular ischemia. No superimposed acute abnormality.   Electronically Signed   By: Franchot Gallo M.D.   On: 05/18/2014 14:58   Ct Head Wo Contrast  05/06/2014   CLINICAL DATA:  Followup subdural hematoma.  EXAM: CT HEAD WITHOUT CONTRAST  TECHNIQUE: Contiguous axial images were obtained from the base of the skull through the vertex without intravenous contrast.  COMPARISON:  05/05/2014  FINDINGS: Skull and Sinuses:Mild right parietal scalp swelling.  No fracture.  Orbits: Bilateral cataract resection.  No traumatic findings  Brain: Stable 4 mm subdural hematoma around the right frontal operculum without mass effect. No new site of hemorrhage is detected.  Remote small vessel infarct involving the left corona radiata. Mild small vessel ischemic gliosis around the lateral ventricles. There is cerebral volume loss which is typical for age.  No evidence of acute infarct, mass lesion, or hydrocephalus.  IMPRESSION: Stable 4 mm subdural hematoma in the right frontal region.    Electronically Signed   By: Monte Fantasia M.D.   On: 05/06/2014 06:15   Mr Jodene Nam Head Wo Contrast  05/18/2014   ADDENDUM REPORT: 05/18/2014 18:07  ADDENDUM: Possible small amount of subarachnoid hemorrhage deep to the subdural hematoma.   Electronically Signed   By: Logan Bores   On: 05/18/2014  18:07   05/18/2014   CLINICAL DATA:  Right-sided subdural hematoma on CT. Transient right-sided facial droop and aphasia.  EXAM: MRI HEAD WITHOUT CONTRAST  MRA HEAD WITHOUT CONTRAST  TECHNIQUE: Multiplanar, multiecho pulse sequences of the brain and surrounding structures were obtained without intravenous contrast. Angiographic images of the head were obtained using MRA technique without contrast.  COMPARISON:  Head CT 05/18/2014. Brain MRI 06/24/2007. CTA neck 04/14/2007. Neck MRA 03/19/2007.  FINDINGS: MRI HEAD FINDINGS  Limited evaluation of the upper cervical spine demonstrates advanced multilevel disc degeneration in the mid cervical spine with anterolisthesis of C3 on C4, more fully evaluated on recent cervical spine CT.  Small right frontotemporal subdural hematoma demonstrates T1 and T2 hyperintensity and measures up to 6 mm in thickness without significant mass effect. FLAIR hyperintensity in a few adjacent right frontal sulci may represent a tiny amount of subarachnoid hemorrhage or artifact. There is no evidence of acute infarct, parenchymal hemorrhage, mass, or midline shift. There is mild cerebral atrophy. A small, chronic infarct is noted in the left lateral lenticulostriate territory. Foci of T2 hyperintensity elsewhere in the cerebral white matter bilaterally are nonspecific but compatible with mild chronic small vessel ischemic disease, slightly progressed from the prior MRI. There is also an unchanged, tiny chronic infarct in the right paracentral pons.  Prior bilateral cataract extraction is noted. Paranasal sinuses are clear. There are trace bilateral mastoid effusions. Major intracranial vascular  flow voids are preserved.  MRA HEAD FINDINGS  Visualized distal vertebral arteries are patent and codominant. There is a mild to moderate distal right vertebral artery stenosis, increased from prior CTA. PICA, AICA, and SCA origins are patent. Mild mid basilar artery narrowing is unchanged. Communicating arteries are not identified. P1 segments are patent without stenosis. There is a mild proximal right P2 stenosis, and there is mild-to-moderate PCA irregularity bilaterally more distally.  Internal carotid arteries are patent from skullbase to carotid termini. Irregularity and mild narrowing near the anterior genu of the carotids bilaterally is similar to the prior MRA. Right ACA is dominant. There is mild left A1 segment irregularity diffusely with at most mild narrowing proximally. MCAs are patent without significant stenosis. No intracranial aneurysm is identified.  IMPRESSION: 1. Small, subacute right frontotemporal subdural hematoma without mass effect, unchanged from CT earlier today. 2. No acute infarct. 3. Mild chronic small vessel ischemic disease. Chronic infarcts in the left basal ganglia region and pons. 4. No major intracranial arterial occlusion. 5. Mild-to-moderate distal right vertebral artery stenosis, increased from prior neck CTA. 6. Unchanged, mild mid basilar artery stenosis. 7. Mild stenosis of the proximal right P2 PCA and bilateral cavernous carotids.  Electronically Signed: By: Logan Bores On: 05/18/2014 18:02   Mr Brain Wo Contrast  05/18/2014   ADDENDUM REPORT: 05/18/2014 18:07  ADDENDUM: Possible small amount of subarachnoid hemorrhage deep to the subdural hematoma.   Electronically Signed   By: Logan Bores   On: 05/18/2014 18:07   05/18/2014   CLINICAL DATA:  Right-sided subdural hematoma on CT. Transient right-sided facial droop and aphasia.  EXAM: MRI HEAD WITHOUT CONTRAST  MRA HEAD WITHOUT CONTRAST  TECHNIQUE: Multiplanar, multiecho pulse sequences of the brain and surrounding  structures were obtained without intravenous contrast. Angiographic images of the head were obtained using MRA technique without contrast.  COMPARISON:  Head CT 05/18/2014. Brain MRI 06/24/2007. CTA neck 04/14/2007. Neck MRA 03/19/2007.  FINDINGS: MRI HEAD FINDINGS  Limited evaluation of the upper cervical spine demonstrates advanced multilevel disc degeneration in  the mid cervical spine with anterolisthesis of C3 on C4, more fully evaluated on recent cervical spine CT.  Small right frontotemporal subdural hematoma demonstrates T1 and T2 hyperintensity and measures up to 6 mm in thickness without significant mass effect. FLAIR hyperintensity in a few adjacent right frontal sulci may represent a tiny amount of subarachnoid hemorrhage or artifact. There is no evidence of acute infarct, parenchymal hemorrhage, mass, or midline shift. There is mild cerebral atrophy. A small, chronic infarct is noted in the left lateral lenticulostriate territory. Foci of T2 hyperintensity elsewhere in the cerebral white matter bilaterally are nonspecific but compatible with mild chronic small vessel ischemic disease, slightly progressed from the prior MRI. There is also an unchanged, tiny chronic infarct in the right paracentral pons.  Prior bilateral cataract extraction is noted. Paranasal sinuses are clear. There are trace bilateral mastoid effusions. Major intracranial vascular flow voids are preserved.  MRA HEAD FINDINGS  Visualized distal vertebral arteries are patent and codominant. There is a mild to moderate distal right vertebral artery stenosis, increased from prior CTA. PICA, AICA, and SCA origins are patent. Mild mid basilar artery narrowing is unchanged. Communicating arteries are not identified. P1 segments are patent without stenosis. There is a mild proximal right P2 stenosis, and there is mild-to-moderate PCA irregularity bilaterally more distally.  Internal carotid arteries are patent from skullbase to carotid termini.  Irregularity and mild narrowing near the anterior genu of the carotids bilaterally is similar to the prior MRA. Right ACA is dominant. There is mild left A1 segment irregularity diffusely with at most mild narrowing proximally. MCAs are patent without significant stenosis. No intracranial aneurysm is identified.  IMPRESSION: 1. Small, subacute right frontotemporal subdural hematoma without mass effect, unchanged from CT earlier today. 2. No acute infarct. 3. Mild chronic small vessel ischemic disease. Chronic infarcts in the left basal ganglia region and pons. 4. No major intracranial arterial occlusion. 5. Mild-to-moderate distal right vertebral artery stenosis, increased from prior neck CTA. 6. Unchanged, mild mid basilar artery stenosis. 7. Mild stenosis of the proximal right P2 PCA and bilateral cavernous carotids.  Electronically Signed: By: Logan Bores On: 05/18/2014 18:02    CBC  Recent Labs Lab 05/15/14 1344 05/18/14 1412 05/18/14 1424  WBC 5.9 5.6  --   HGB 13.5 12.7 15.0  HCT 40.6 40.3 44.0  PLT 273.0 217  --   MCV 95.3 98.5  --   MCH  --  31.1  --   MCHC 33.3 31.5  --   RDW 12.8 12.6  --   LYMPHSABS 2.2 2.3  --   MONOABS 0.6 0.6  --   EOSABS 0.1 0.1  --   BASOSABS 0.0 0.0  --     Chemistries   Recent Labs Lab 05/15/14 1344 05/18/14 1412 05/18/14 1424 05/20/14 1156  NA 134* 137 137 137  K 4.6 4.9 4.9 3.9  CL 104 104 103 105  CO2 27 26  --  23  GLUCOSE 105* 107* 108* 117*  BUN 16 18 24* 12  CREATININE 0.61 0.79 0.80 0.59  CALCIUM 9.8 9.7  --  8.9  AST 18 24  --  20  ALT 8 12  --  9  ALKPHOS 46 44  --  40  BILITOT 0.5 0.7  --  0.5   ------------------------------------------------------------------------------------------------------------------ estimated creatinine clearance is 44.2 mL/min (by C-G formula based on Cr of 0.59). ------------------------------------------------------------------------------------------------------------------  Recent Labs   05/19/14 0435  HGBA1C 5.5   ------------------------------------------------------------------------------------------------------------------  Recent  Labs  05/19/14 0435  CHOL 127  HDL 36*  LDLCALC 77  TRIG 69  CHOLHDL 3.5   ------------------------------------------------------------------------------------------------------------------ No results for input(s): TSH, T4TOTAL, T3FREE, THYROIDAB in the last 72 hours.  Invalid input(s): FREET3 ------------------------------------------------------------------------------------------------------------------ No results for input(s): VITAMINB12, FOLATE, FERRITIN, TIBC, IRON, RETICCTPCT in the last 72 hours.  Coagulation profile  Recent Labs Lab 05/18/14 1412  INR 0.98    No results for input(s): DDIMER in the last 72 hours.  Cardiac Enzymes No results for input(s): CKMB, TROPONINI, MYOGLOBIN in the last 168 hours.  Invalid input(s): CK ------------------------------------------------------------------------------------------------------------------ Invalid input(s): POCBNP  No results for input(s): GLUCAP in the last 72 hours.   [facility] M.D. [facility] 05/21/2014, 9:30 AM  Pager: [phone]  After 7pm go to [email or web address] - password [email or web address]  Call night coverage person covering after 7pm

## 2014-05-22 ENCOUNTER — Inpatient Hospital Stay (HOSPITAL_COMMUNITY): Payer: Medicare Other

## 2014-05-22 DIAGNOSIS — R29810 Facial weakness: Secondary | ICD-10-CM | POA: Insufficient documentation

## 2014-05-22 DIAGNOSIS — R011 Cardiac murmur, unspecified: Secondary | ICD-10-CM

## 2014-05-22 DIAGNOSIS — R296 Repeated falls: Secondary | ICD-10-CM

## 2014-05-22 LAB — BASIC METABOLIC PANEL
ANION GAP: 7 (ref 5–15)
BUN: 8 mg/dL (ref 6–20)
CO2: 25 mmol/L (ref 22–32)
Calcium: 8.8 mg/dL — ABNORMAL LOW (ref 8.9–10.3)
Chloride: 107 mmol/L (ref 101–111)
Creatinine, Ser: 0.51 mg/dL (ref 0.44–1.00)
GFR calc non Af Amer: >60 mL/min (ref >60)
Glucose, Bld: 85 mg/dL (ref 70–99)
Potassium: 3.9 mmol/L (ref 3.5–5.1)
Sodium: 139 mmol/L (ref 135–145)

## 2014-05-22 MED ORDER — CETYLPYRIDINIUM CHLORIDE 0.05 % MT LIQD
7.0000 mL | Freq: Two times a day (BID) | OROMUCOSAL | Status: DC
  Administered 2014-05-22 – 2014-05-23 (×4): 7 mL via OROMUCOSAL

## 2014-05-22 NOTE — Procedures (Signed)
ELECTROENCEPHALOGRAM REPORT   Patient: Krista Ellis      Room #: 6F-68 Age: 81 y.o.        Sex: female Referring Physician: Dr Ree Kida Report Date:  05/22/2014        Interpreting Physician: Hulen Luster  History: Krista Ellis is an 81 y.o. female with SDH having intermittent episodes of left facial droop and speech diffficulty  Medications:  Scheduled: . amLODipine  5 mg Oral Daily  . antiseptic oral rinse  7 mL Mouth Rinse BID  . cefTRIAXone (ROCEPHIN)  IV  1 g Intravenous Q24H  . clopidogrel  75 mg Oral Daily  . feeding supplement (ENSURE ENLIVE)  237 mL Oral BID PC  . fluticasone  1 spray Each Nare Daily  . levETIRAcetam  1,500 mg Intravenous Q12H  . levothyroxine  25 mcg Oral QAC breakfast  . loratadine  10 mg Oral Daily  . metoprolol succinate  12.5 mg Oral Daily  . miconazole   Topical BID  . phenazopyridine  100 mg Oral TID WC  . pravastatin  40 mg Oral QPM  . pregabalin  25 mg Oral BID    Conditions of Recording:  This is a 16 channel EEG carried out with the patient in the drowsy state.  Description:  The waking background activity consists of a low voltage, symmetrical, poorly organized, theta activity, seen from the parieto-occipital and posterior temporal regions.  There are intermittent periods of posterior alpha activity mixed in. Low voltage fast activity, poorly organized, is seen anteriorly and is at times superimposed on more posterior regions.  A mixture of theta and alpha rhythms are seen from the central and temporal regions. Focal slowing is intermittently seen over the right frontotemporal region. No evolution or epileptiform activity is noted.   The patient drowses with slowing to irregular, low voltage theta and beta activity. Normal sleep architecture is not observed. Hyperventilation and intermittent photic stimulation was not performed.   IMPRESSION: Abnormal EEG due to generalized slowing indicating a mild to moderate cerebral  disturbance (encephalopathy). Focal slowing is intermittently noted over the right fronto-temporal region indicating a focal disturbance and potential seizure foci.    Jim Like, DO [facility] [phone]  If 7pm- 7am, please page neurology on call as listed in AMION. 05/22/2014, 5:08 PM

## 2014-05-22 NOTE — Progress Notes (Signed)
Routine EEG completed; results pending

## 2014-05-22 NOTE — Progress Notes (Signed)
UR complete.  Kiowa Peifer RN, MSN 

## 2014-05-22 NOTE — Progress Notes (Signed)
MBSS complete. Full report located under chart review in imaging section.  Krista Ellis, M.A. CCC-SLP [phone]  

## 2014-05-22 NOTE — Progress Notes (Signed)
PT Cancellation Note  Patient Details Name: Krista Ellis MRN: [id number] DOB: Jul 18, 1930   Cancelled Treatment:    Reason Eval/Treat Not Completed: Patient at procedure or test/unavailable.   Duncan Dull 05/22/2014, 11:06 AM Alben Deeds, PT DPT  [phone]

## 2014-05-22 NOTE — Progress Notes (Signed)
CARE MANAGEMENT NOTE 05/22/2014  Patient:  Krista Ellis, Krista Ellis   Account Number:  [id number]  Date Initiated:  05/19/2014  Documentation initiated by:  Lorne Skeens  Subjective/Objective Assessment:   Patient was admitted with slurred speech, facial droop. Lives at home with caregivers.     Action/Plan:   Will follow for discharge needs pending PT/OT evals and physician orders   Anticipated DC Date:  05/22/2014   Anticipated DC Plan:  SKILLED NURSING FACILITY  In-house referral  Clinical Social Worker         Choice offered to / List presented to:             Status of service:  In process, will continue to follow Medicare Important Message given?  YES (If response is "NO", the following Medicare IM given date fields will be blank) Date Medicare IM given:  05/22/2014 Medicare IM given by:  Lorne Skeens Date Additional Medicare IM given:   Additional Medicare IM given by:    Discharge Disposition:    Per UR Regulation:  Reviewed for med. necessity/level of care/duration of stay  If discussed at [place] of Stay Meetings, dates discussed:    Comments:  05/22/14 [place], MSN, CM- Medicare IM letter provided.

## 2014-05-22 NOTE — Progress Notes (Signed)
[facility]                                                                              Patient Demographics  Krista Ellis, is a 81 y.o. female, DOB - [birth year], JTT:[id number]  Admit date - 05/18/2014   Admitting Physician Annita Brod, MD  Outpatient Primary MD for the patient is [name], MD  LOS - 4   Chief Complaint  Patient presents with  . Aphasia  . Dysphagia      HPI on 05/18/2014 by Ms. Erin Hearing, NP with Dr. Gevena Barre This is a 81 year old female patient recently discharged on or/16/16 after an admission for a right sided subdural hematoma, presumed traumatic in etiology. Prior to admission patient had been on Plavix and this medicine was placed on hold last resume on 4/30. Patient returned to the ER today with complaints of intermittent episodes of aphasia with associated inability to control oral secretions. These episodes have been occurring with no particular pattern of the past seven days. Duration of episodes between 1 and 5 minutes. 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 It is noted that the symptoms are different. In addition patient has been reporting significant right-sided supraorbital headache. At her bedside the daughter clarified that patient actually had been having the right supraorbital headache several days before she fell and was diagnosed with subdural hematoma. Prior to the fall, the patient's physician thought that a new medication for UTI may have been causing the headache so that medication was discontinued. Patient has only been using plain Tylenol for the headache and reports utilizing her chronic hydrocodone once daily for her chronic hip pain. In regards to current symptoms patient has been having difficulty finding the correct words and her speech sounds like "marbles in  her mouth". She's had no other associated neurological signs or symptoms such as visual changes, facial drooping, numbness or tingling or weakness in any of the extremities. She does report saliva collecting in her mouth which she is unable to control until the spell resolves. Her daughter at the bedside also noticed that the patient has had poor oral intake for at least one week. When questioned about rationale for Plavix patient does not recall the exact diagnosis this was prescribed for and her daughter reports that they think the Plavix was started around 1991. In the ER patient was afebrile and hemodynamically stable maintaining O2 saturations of 98% on room air. CT of the head without contrast revealed expected evolutionary change in the right-sided subdural hematoma which is 4 mm in thickness with progression to a lower density fluid. There was atrophy and chronic microvascular ischemia without any superimposed acute abnormality. Neurological consultation is pending. EKG was unremarkable and demonstrates sinus rhythm only. Laboratory data was unremarkable except for BUN of 24 were previously had been doing 1318, and mildly elevated glucose of 108. Troponin was negative. Hemoglobin was also slightly elevated at 15 compared to 12.7 at time of discharge. Coags were normal.  Assessment & Plan   TIA  -MRI/MRA showed small subacute  right frontotemporal subdural hematoma and possible small amount of subarachnoid hemorrhage deep to the subdural hematoma without mass effect, no acute infarct -MRA showed mild to moderate distal right vertebral artery stenosis, increased from prior neck CTA, mild mid basilar artery stenosis, mild chronic small vessel ischemic disease. -Echocardiogram showed EF of 09-47%, grade 1 diastolic dysfunction -EEG showed generalized slowing but no seizures  -Placed on aspirin 81 mg daily by neurology.  -PT, OT evaluation-> skilled nursing facility -Carotid Dopplers right ICA 50%  stenosis, left ICA 40-59% -Passed a swallow testing- placed on dysphagia 3 diet -Question 24hr EEG?  Seizures -Patient has had several episodes of aphasia- has been receiving Keppra 1000 BID  -given a bolus of 500 mg of Keppra and increased dose to 1500 BID (Dr Nicole Kindred ) -stroke team to continue to manage -Pending continuous EEG  Recurrent falls/known gait disturbance -PT evaluation recommended skilled nursing facility  History of subdural hematoma -Continue to hold Plavix  Hypertension -Blood pressure currently well controlled -Continue preadmission Norvasc and Toprol  Hypothyroidism -Continue Synthroid  Osteoarthritis -Continue hydrocodone and Lyrica  Systolic murmur -2-D echo showed EF of 09-62%, grade 1 diastolic dysfunction, mild aortic stenosis  Dyslipidemia -Continue statin -Lipid panel showed LDL 77  UTI (urinary tract infection) -Urine culture reveals no growth- placed on IV Rocephin- today is day 3 -Pyridium started overnight for "pressure" sensation-when further discussing with patient and her daughter, it appears that most of her pain is in the left groin and is intermittent-she has been told in the past that she had a left renal stone and it is possible the stone may be passing, however, clear today -Symptoms have improved slightly -may need CT of the abdomen pelvis to evaluate for nephrolithiasis unless symptoms persist -of note UA obtained on 4/30 did reveal a large amount of hemoglobin & 3-6 RBC; however, urine culture shows no growth -Spoke to pharmacy regarding pyridium as it does have a high risk of adverse events in elderly- will continue for another 24 hours. Patient does follow up with a urologist in [place], [place].   Code Status: DNR  Family Communication: Daughter at bedside  Disposition Plan: Admitted, pending further recommendations from neurology  Time Spent in minutes   30 minutes  Procedures  Echocardiogram  Consults    Neurology  DVT Prophylaxis    Lab Results  Component Value Date   PLT 217 05/18/2014    Medications  Scheduled Meds: . amLODipine  5 mg Oral Daily  . antiseptic oral rinse  7 mL Mouth Rinse BID  . cefTRIAXone (ROCEPHIN)  IV  1 g Intravenous Q24H  . clopidogrel  75 mg Oral Daily  . feeding supplement (ENSURE ENLIVE)  237 mL Oral BID PC  . fluticasone  1 spray Each Nare Daily  . levETIRAcetam  1,500 mg Intravenous Q12H  . levothyroxine  25 mcg Oral QAC breakfast  . loratadine  10 mg Oral Daily  . metoprolol succinate  12.5 mg Oral Daily  . miconazole   Topical BID  . phenazopyridine  100 mg Oral TID WC  . pravastatin  40 mg Oral QPM  . pregabalin  25 mg Oral BID   Continuous Infusions: . sodium chloride 100 mL/hr at 05/22/14 0349   PRN Meds:.acetaminophen, HYDROcodone-acetaminophen, RESOURCE THICKENUP CLEAR, senna-docusate  Antibiotics   Anti-infectives    Start     Dose/Rate Route Frequency Ordered Stop   05/19/14 1100  cefTRIAXone (ROCEPHIN) 1 g in dextrose 5 % 50 mL IVPB - Premix  1 g 100 mL/hr over 30 Minutes Intravenous Every 24 hours 05/19/14 0931          Subjective:   Sandria Mcenroe seen and examined today.  Patient states she has a headache over her right eye.  She denies vision changes. She denies chest pain, shortness of breath, abdominal pain.  Her daughter states that she complained of headache on the left sided.   Objective:   Filed Vitals:   05/21/14 2029 05/22/14 0116 05/22/14 0546 05/22/14 1134  BP: 134/55 160/58 142/51 129/49  Pulse: 78 73 64 64  Temp:  97.5 F (36.4 C) 98.4 F (36.9 C) 98 F (36.7 C)  TempSrc: Oral Oral Oral Oral  Resp: 18 18 16 18   Height:      Weight:      SpO2: 95% 95% 93% 98%    Wt Readings from Last 3 Encounters:  05/20/14 50.803 kg (112 lb)  05/15/14 50.916 kg (112 lb 4 oz)  05/06/14 50.6 kg (111 lb 8.8 oz)     Intake/Output Summary (Last 24 hours) at 05/22/14 1320 Last data filed at 05/21/14 1824   Gross per 24 hour  Intake    120 ml  Output      0 ml  Net    120 ml    Exam  General: Well developed, well nourished, NAD, appears stated age  58: NCAT, mucous membranes moist.   Cardiovascular: S1 S2 auscultated, 2/6 SEM, RRR  Respiratory: Clear to auscultation bilaterally with equal chest rise  Abdomen: Soft, nontender, nondistended, + bowel sounds  Extremities: warm dry without cyanosis clubbing or edema  Neuro: AAOx3, nonfocal  Psych: Normal affect and demeanor    Data Review   Micro Results Recent Results (from the past 240 hour(s))  Urine culture     Status: None   Collection Time: 05/20/14  2:25 AM  Result Value Ref Range Status   Specimen Description URINE, CLEAN CATCH  Final   Special Requests NONE  Final   Colony Count NO GROWTH Performed at [facility]   Final   Culture NO GROWTH Performed at [facility]   Final   Report Status 05/21/2014 FINAL  Final    Radiology Reports Ct Head Wo Contrast  05/18/2014   CLINICAL DATA:  Aphasia and dysphagia.  Followup subdural hematoma.  EXAM: CT HEAD WITHOUT CONTRAST  TECHNIQUE: Contiguous axial images were obtained from the base of the skull through the vertex without intravenous contrast.  COMPARISON:  CT head 05/06/2014  FINDINGS: Resolving right-sided subdural hematoma. This measures approximately 4 mm in thickness and has become less dense compared with the prior study. No new area of hemorrhage  Generalized atrophy. Chronic ischemic changes throughout the white matter and centrum semiovale on the left unchanged. No acute infarct. Negative for mass lesion. No shift of the midline structures.  Arterial calcification.  Negative for skull fracture  IMPRESSION: Expected evolutionary change in right-sided subdural hematoma which is 4 mm in thickness with progression to lower density fluid.  Atrophy and chronic microvascular ischemia. No superimposed acute abnormality.   Electronically Signed   By:  Franchot Gallo M.D.   On: 05/18/2014 14:58   Ct Head Wo Contrast  05/06/2014   CLINICAL DATA:  Followup subdural hematoma.  EXAM: CT HEAD WITHOUT CONTRAST  TECHNIQUE: Contiguous axial images were obtained from the base of the skull through the vertex without intravenous contrast.  COMPARISON:  05/05/2014  FINDINGS: Skull and Sinuses:Mild right parietal scalp swelling.  No fracture.  Orbits: Bilateral cataract resection.  No traumatic findings  Brain: Stable 4 mm subdural hematoma around the right frontal operculum without mass effect. No new site of hemorrhage is detected.  Remote small vessel infarct involving the left corona radiata. Mild small vessel ischemic gliosis around the lateral ventricles. There is cerebral volume loss which is typical for age.  No evidence of acute infarct, mass lesion, or hydrocephalus.  IMPRESSION: Stable 4 mm subdural hematoma in the right frontal region.   Electronically Signed   By: Monte Fantasia M.D.   On: 05/06/2014 06:15   Mr Jodene Nam Head Wo Contrast  05/18/2014   ADDENDUM REPORT: 05/18/2014 18:07  ADDENDUM: Possible small amount of subarachnoid hemorrhage deep to the subdural hematoma.   Electronically Signed   By: Logan Bores   On: 05/18/2014 18:07   05/18/2014   CLINICAL DATA:  Right-sided subdural hematoma on CT. Transient right-sided facial droop and aphasia.  EXAM: MRI HEAD WITHOUT CONTRAST  MRA HEAD WITHOUT CONTRAST  TECHNIQUE: Multiplanar, multiecho pulse sequences of the brain and surrounding structures were obtained without intravenous contrast. Angiographic images of the head were obtained using MRA technique without contrast.  COMPARISON:  Head CT 05/18/2014. Brain MRI 06/24/2007. CTA neck 04/14/2007. Neck MRA 03/19/2007.  FINDINGS: MRI HEAD FINDINGS  Limited evaluation of the upper cervical spine demonstrates advanced multilevel disc degeneration in the mid cervical spine with anterolisthesis of C3 on C4, more fully evaluated on recent cervical spine CT.  Small  right frontotemporal subdural hematoma demonstrates T1 and T2 hyperintensity and measures up to 6 mm in thickness without significant mass effect. FLAIR hyperintensity in a few adjacent right frontal sulci may represent a tiny amount of subarachnoid hemorrhage or artifact. There is no evidence of acute infarct, parenchymal hemorrhage, mass, or midline shift. There is mild cerebral atrophy. A small, chronic infarct is noted in the left lateral lenticulostriate territory. Foci of T2 hyperintensity elsewhere in the cerebral white matter bilaterally are nonspecific but compatible with mild chronic small vessel ischemic disease, slightly progressed from the prior MRI. There is also an unchanged, tiny chronic infarct in the right paracentral pons.  Prior bilateral cataract extraction is noted. Paranasal sinuses are clear. There are trace bilateral mastoid effusions. Major intracranial vascular flow voids are preserved.  MRA HEAD FINDINGS  Visualized distal vertebral arteries are patent and codominant. There is a mild to moderate distal right vertebral artery stenosis, increased from prior CTA. PICA, AICA, and SCA origins are patent. Mild mid basilar artery narrowing is unchanged. Communicating arteries are not identified. P1 segments are patent without stenosis. There is a mild proximal right P2 stenosis, and there is mild-to-moderate PCA irregularity bilaterally more distally.  Internal carotid arteries are patent from skullbase to carotid termini. Irregularity and mild narrowing near the anterior genu of the carotids bilaterally is similar to the prior MRA. Right ACA is dominant. There is mild left A1 segment irregularity diffusely with at most mild narrowing proximally. MCAs are patent without significant stenosis. No intracranial aneurysm is identified.  IMPRESSION: 1. Small, subacute right frontotemporal subdural hematoma without mass effect, unchanged from CT earlier today. 2. No acute infarct. 3. Mild chronic small  vessel ischemic disease. Chronic infarcts in the left basal ganglia region and pons. 4. No major intracranial arterial occlusion. 5. Mild-to-moderate distal right vertebral artery stenosis, increased from prior neck CTA. 6. Unchanged, mild mid basilar artery stenosis. 7. Mild stenosis of the proximal right P2 PCA and bilateral cavernous carotids.  Electronically Signed:  By: Logan Bores On: 05/18/2014 18:02   Mr Brain Wo Contrast  05/18/2014   ADDENDUM REPORT: 05/18/2014 18:07  ADDENDUM: Possible small amount of subarachnoid hemorrhage deep to the subdural hematoma.   Electronically Signed   By: Logan Bores   On: 05/18/2014 18:07   05/18/2014   CLINICAL DATA:  Right-sided subdural hematoma on CT. Transient right-sided facial droop and aphasia.  EXAM: MRI HEAD WITHOUT CONTRAST  MRA HEAD WITHOUT CONTRAST  TECHNIQUE: Multiplanar, multiecho pulse sequences of the brain and surrounding structures were obtained without intravenous contrast. Angiographic images of the head were obtained using MRA technique without contrast.  COMPARISON:  Head CT 05/18/2014. Brain MRI 06/24/2007. CTA neck 04/14/2007. Neck MRA 03/19/2007.  FINDINGS: MRI HEAD FINDINGS  Limited evaluation of the upper cervical spine demonstrates advanced multilevel disc degeneration in the mid cervical spine with anterolisthesis of C3 on C4, more fully evaluated on recent cervical spine CT.  Small right frontotemporal subdural hematoma demonstrates T1 and T2 hyperintensity and measures up to 6 mm in thickness without significant mass effect. FLAIR hyperintensity in a few adjacent right frontal sulci may represent a tiny amount of subarachnoid hemorrhage or artifact. There is no evidence of acute infarct, parenchymal hemorrhage, mass, or midline shift. There is mild cerebral atrophy. A small, chronic infarct is noted in the left lateral lenticulostriate territory. Foci of T2 hyperintensity elsewhere in the cerebral white matter bilaterally are nonspecific  but compatible with mild chronic small vessel ischemic disease, slightly progressed from the prior MRI. There is also an unchanged, tiny chronic infarct in the right paracentral pons.  Prior bilateral cataract extraction is noted. Paranasal sinuses are clear. There are trace bilateral mastoid effusions. Major intracranial vascular flow voids are preserved.  MRA HEAD FINDINGS  Visualized distal vertebral arteries are patent and codominant. There is a mild to moderate distal right vertebral artery stenosis, increased from prior CTA. PICA, AICA, and SCA origins are patent. Mild mid basilar artery narrowing is unchanged. Communicating arteries are not identified. P1 segments are patent without stenosis. There is a mild proximal right P2 stenosis, and there is mild-to-moderate PCA irregularity bilaterally more distally.  Internal carotid arteries are patent from skullbase to carotid termini. Irregularity and mild narrowing near the anterior genu of the carotids bilaterally is similar to the prior MRA. Right ACA is dominant. There is mild left A1 segment irregularity diffusely with at most mild narrowing proximally. MCAs are patent without significant stenosis. No intracranial aneurysm is identified.  IMPRESSION: 1. Small, subacute right frontotemporal subdural hematoma without mass effect, unchanged from CT earlier today. 2. No acute infarct. 3. Mild chronic small vessel ischemic disease. Chronic infarcts in the left basal ganglia region and pons. 4. No major intracranial arterial occlusion. 5. Mild-to-moderate distal right vertebral artery stenosis, increased from prior neck CTA. 6. Unchanged, mild mid basilar artery stenosis. 7. Mild stenosis of the proximal right P2 PCA and bilateral cavernous carotids.  Electronically Signed: By: Logan Bores On: 05/18/2014 18:02   Dg Swallowing Func-speech Pathology  05/22/2014    Objective Swallowing Evaluation:    Patient Details  Name: VEDANSHI MASSARO MRN: [id number] Date of  Birth: 1930/06/14  Today's Date: 05/22/2014 Time: SLP Start Time (ACUTE ONLY): 1039-SLP Stop Time (ACUTE ONLY): 1054 SLP Time Calculation (min) (ACUTE ONLY): 15 min  Past Medical History:  Past Medical History  Diagnosis Date  . Hypertension   . Hyperlipidemia   . Arthritis   . Hemihypertrophy   . Myocardial infarction   .  Thyroid disease   . CVA (cerebral infarction) 2003  . Peripheral vascular disease     s/p stent right leg  . Pulmonary nodule     stable on Chest CT March 2014, Followed at [facility]  . Murmur, cardiac    Past Surgical History:  Past Surgical History  Procedure Laterality Date  . Abdominal hysterectomy    . Total hip arthroplasty      right  . Spine surgery      steel rod in back   HPI:  Other Pertinent Information: 81 yo female adm to [facility] with speech slurred  and dysphagia. 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 MRI showed ? small amount of SAH, chronic  right basal ganglia and paracentral pons CVA. Pt admits to poor appetite  x1 month but denies dysphagia being source of poor intake. Repeat orders  were recieved due to change in the patient's status.  She had a seizure.   Following the seizure the pt presented with slurred speech.  Given this  the pt was made NPO and repeat swallow orders were entered.    No Data Recorded  Assessment / Plan / Recommendation CHL IP CLINICAL IMPRESSIONS 05/22/2014  Therapy Diagnosis Mild oral phase dysphagia;Mild pharyngeal phase  dysphagia   Clinical Impression Pt has a mild oropharyngeal dysphagia with slow A/P  transit of solid POs and premature spillage of liquid consistencies that  reaches the pyriform sinuses prior to swallow trigger. Thin liquids are  subsequently aspirated before the swallow, with intermittent sensation and  weak reflexive and cued coughs that are not able to effectively expel  aspirates from the trachea throughout the  study. Bolus manipulation and  postural maneuvers were not effective at increasing airway protection. No  penetration was observed with solids or nectar thick liquids. Recommend to  continue Dys 3 diet and nectar thick liquids.       CHL IP TREATMENT RECOMMENDATION 05/22/2014  Treatment Recommendations Therapy as outlined in treatment plan below     CHL IP DIET RECOMMENDATION 05/22/2014  SLP Diet Recommendations Nectar;Dysphagia 3 (Mech soft)  Liquid Administration via (None)  Medication Administration Whole meds with puree  Compensations Slow rate;Small sips/bites  Postural Changes and/or Swallow Maneuvers (None)     CHL IP OTHER RECOMMENDATIONS 05/22/2014  Recommended Consults (None)  Oral Care Recommendations Oral care BID  Other Recommendations Order thickener from pharmacy;Prohibited food  (jello, ice cream, thin soups);Remove water pitcher     CHL IP FOLLOW UP RECOMMENDATIONS 05/19/2014  Follow up Recommendations None     CHL IP FREQUENCY AND DURATION 05/22/2014  Speech Therapy Frequency (ACUTE ONLY) (None)  Treatment Duration 2 weeks     Pertinent Vitals/Pain: n/a     SLP Swallow Goals No flowsheet data found.  No flowsheet data found.    CHL IP REASON FOR REFERRAL 05/22/2014  Reason for Referral Objectively evaluate swallowing function     CHL IP ORAL PHASE 05/22/2014  Oral Phase Impaired       CHL IP PHARYNGEAL PHASE 05/22/2014  Pharyngeal Phase Impaired        CHL IP CERVICAL ESOPHAGEAL PHASE 05/22/2014  Cervical Esophageal Phase [facility]  Germain Osgood, M.A. CCC-SLP [phone]         Germain Osgood 05/22/2014, 11:57 AM     CBC  Recent Labs Lab 05/15/14 1344 05/18/14 1412 05/18/14  1424  WBC 5.9 5.6  --   HGB 13.5 12.7 15.0  HCT 40.6 40.3 44.0  PLT 273.0 217  --   MCV 95.3 98.5  --   MCH  --  31.1  --   MCHC 33.3 31.5  --   RDW 12.8 12.6  --   LYMPHSABS 2.2 2.3  --   MONOABS 0.6 0.6  --   EOSABS 0.1 0.1  --   BASOSABS 0.0 0.0  --     Chemistries   Recent Labs Lab 05/15/14 1344 05/18/14 1412  05/18/14 1424 05/20/14 1156 05/21/14 0825 05/22/14 0724  NA 134* 137 137 137 140 139  K 4.6 4.9 4.9 3.9 3.9 3.9  CL 104 104 103 105 108 107  CO2 27 26  --  23 25 25   GLUCOSE 105* 107* 108* 117* 86 85  BUN 16 18 24* 12 8 8   CREATININE 0.61 0.79 0.80 0.59 0.51 0.51  CALCIUM 9.8 9.7  --  8.9 9.0 8.8*  AST 18 24  --  20  --   --   ALT 8 12  --  9  --   --   ALKPHOS 46 44  --  40  --   --   BILITOT 0.5 0.7  --  0.5  --   --    ------------------------------------------------------------------------------------------------------------------ estimated creatinine clearance is 44.2 mL/min (by C-G formula based on Cr of 0.51). ------------------------------------------------------------------------------------------------------------------ No results for input(s): HGBA1C in the last 72 hours. ------------------------------------------------------------------------------------------------------------------ No results for input(s): CHOL, HDL, LDLCALC, TRIG, CHOLHDL, LDLDIRECT in the last 72 hours. ------------------------------------------------------------------------------------------------------------------ No results for input(s): TSH, T4TOTAL, T3FREE, THYROIDAB in the last 72 hours.  Invalid input(s): FREET3 ------------------------------------------------------------------------------------------------------------------ No results for input(s): VITAMINB12, FOLATE, FERRITIN, TIBC, IRON, RETICCTPCT in the last 72 hours.  Coagulation profile  Recent Labs Lab 05/18/14 1412  INR 0.98    No results for input(s): DDIMER in the last 72 hours.  Cardiac Enzymes No results for input(s): CKMB, TROPONINI, MYOGLOBIN in the last 168 hours.  Invalid input(s): CK ------------------------------------------------------------------------------------------------------------------ Invalid input(s): POCBNP    Netha Dafoe D.O. on 05/22/2014 at 1:20 PM  Between 7am to 7pm - Pager -  [phone]  After 7pm go to [email or web address] - password [email or web address]  And look for the night coverage person covering for me after hours  [facility] Office  [phone]

## 2014-05-22 NOTE — Progress Notes (Signed)
STROKE TEAM PROGRESS NOTE   HISTORY Krista Ellis is an 81 y.o. 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 Patient was with her family member today when she was noted to have Left facial droop, difficulty expressing herself and per family, sounded as her tongue was thick. Per family this only lasted for a few minutes and then fully resolved. Currently she is back to her baseline.  This AM another episode of L facial droop and slurry speech similar to yesterday. Her Keppra dose was increased.    Date last known well: Date: 05/18/2014 Time last known well: Time: 13:00 tPA Given: No: symptoms resolved Modified Rankin: Rankin Score=3   SUBJECTIVE (INTERVAL HISTORY) Patient had recurrent episodes of speech difficulties with left facial droop over the weekend and Keppra dose was progressively increased to currently 1500 twice daily. She has not had any further episodes for close to 24 hours now. Patient's daughter is at the bedside  OBJECTIVE Temp:  [97.5 F (36.4 C)-98.6 F (37 C)] 98 F (36.7 C) (05/02 1134) Pulse Rate:  [64-78] 64 (05/02 1134) Cardiac Rhythm:  [-] Normal sinus rhythm (05/02 0800) Resp:  [16-18] 18 (05/02 1134) BP: (121-160)/(36-58) 129/49 mmHg (05/02 1134) SpO2:  [93 %-98 %] 98 % (05/02 1134)  No results for input(s): GLUCAP in the last 168 hours.  Recent Labs Lab 05/15/14 1344 05/18/14 1412 05/18/14 1424 05/20/14 1156 05/21/14 0825 05/22/14 0724  NA 134* 137 137 137 140 139  K 4.6 4.9 4.9 3.9 3.9 3.9  CL 104 104 103 105 108 107  CO2 27 26  --  23 25 25   GLUCOSE 105* 107* 108* 117* 86 85  BUN 16 18 24* 12 8 8   CREATININE 0.61 0.79 0.80 0.59 0.51 0.51  CALCIUM 9.8 9.7  --  8.9 9.0 8.8*    Recent Labs Lab 05/15/14 1344 05/18/14 1412 05/20/14 1156  AST 18 24 20   ALT 8 12 9   ALKPHOS 46 44 40  BILITOT 0.5 0.7 0.5  PROT 7.1 7.0 6.0  ALBUMIN 3.6 3.4* 2.9*    Recent Labs Lab 05/15/14 1344 05/18/14 1412  05/18/14 1424  WBC 5.9 5.6  --   NEUTROABS 3.0 2.6  --   HGB 13.5 12.7 15.0  HCT 40.6 40.3 44.0  MCV 95.3 98.5  --   PLT 273.0 217  --    No results for input(s): CKTOTAL, CKMB, CKMBINDEX, TROPONINI in the last 168 hours. No results for input(s): LABPROT, INR in the last 72 hours.  Recent Labs  05/20/14 0225  COLORURINE AMBER*  LABSPEC 1.020  PHURINE 5.5  GLUCOSEU NEGATIVE  HGBUR LARGE*  BILIRUBINUR NEGATIVE  KETONESUR NEGATIVE  PROTEINUR NEGATIVE  UROBILINOGEN 1.0  NITRITE NEGATIVE  LEUKOCYTESUR MODERATE*       Component Value Date/Time   CHOL 127 05/19/2014 0435   TRIG 69 05/19/2014 0435   HDL 36* 05/19/2014 0435   CHOLHDL 3.5 05/19/2014 0435   VLDL 14 05/19/2014 0435   LDLCALC 77 05/19/2014 0435   Lab Results  Component Value Date   HGBA1C 5.5 05/19/2014      Component Value Date/Time   LABOPIA POSITIVE* 05/18/2014 1750   COCAINSCRNUR NONE DETECTED 05/18/2014 1750   LABBENZ NONE DETECTED 05/18/2014 1750   AMPHETMU NONE DETECTED 05/18/2014 1750   THCU NONE DETECTED 05/18/2014 1750   LABBARB NONE DETECTED 05/18/2014 1750    No results for input(s): ETH in the last 168 hours.  Ct Head Wo  Contrast 05/18/2014    Expected evolutionary change in right-sided subdural hematoma which is 4 mm in thickness with progression to lower density fluid.  Atrophy and chronic microvascular ischemia. No superimposed acute abnormality.     Mr Virgel Paling Wo Contrast 05/18/2014   ADDENDUM: Possible small amount of subarachnoid hemorrhage deep to the subdural hematoma.   1. Small, subacute right frontotemporal subdural hematoma without mass effect, unchanged from CT earlier today. 2. No acute infarct.  3. Mild chronic small vessel ischemic disease. Chronic infarcts in the left basal ganglia region and pons.  4. No major intracranial arterial occlusion.  5. Mild-to-moderate distal right vertebral artery stenosis, increased from prior neck CTA.  6. Unchanged, mild mid basilar artery  stenosis.  7. Mild stenosis of the proximal right P2 PCA and bilateral cavernous carotids.    CUS - The right ICA Stent showed a mild amount of residual plaque with less than 50% stenosis. The left ICA showed a moderate amount of calcified plaque with 40-59% stenosis, and bilateral vertebral arteries are antegrade.  2D echo - - Left ventricle: The cavity size was normal. Wall thickness wasincreased in a pattern of mild LVH. Systolic function wasvigorous. The estimated ejection fraction was in the range of 65%to 70%. Doppler parameters are consistent with abnormal leftventricular relaxation (grade 1 diastolic dysfunction). - Aortic valve: AV is thickened, calcified. Peak and mean gradientsthrough the valve are 28 and 14 mm Hg respectively consistentwith mild aortic stenosis. Valve area (VTI): 1.43 cm^2. Valvearea (Vmax): 1.31 cm^2. Valve area (Vmean): 1.49 cm^2. - Pulmonary arteries: PA peak pressure: 32 mm Hg (S).  EEG - Impression: This awake and asleep EEG is mildly abnormal due to occasional diffuse delta slowing of the background.     PHYSICAL EXAM General - frail elderly Caucasian lady, in no apparent distress.  Ophthalmologic - fundi not visualized due to eye movement.  Cardiovascular - Regular rate and rhythm with no murmur.  Mental Status -  Level of arousal and orientation to time, place, and person were intact. Language including expression, naming, repetition, comprehension was assessed and found intact. Fund of Knowledge was assessed and was intact.  Cranial Nerves II - XII - II - Visual field intact OU. III, IV, VI - Extraocular movements intact. V - Facial sensation intact bilaterally. VII - Facial movement intact bilaterally. VIII - Hearing & vestibular intact bilaterally. X - Palate elevates symmetrically. XI - Chin turning & shoulder shrug intact bilaterally. XII - Tongue protrusion intact.  Motor Strength - The patient's strength was 4/5 in all  extremities and pronator drift was absent.  Bulk was normal and fasciculations were absent.   Motor Tone - Muscle tone was assessed at the neck and appendages and was normal.  Reflexes - The patient's reflexes were 1+ in all extremities and she had no pathological reflexes.  Sensory - Light touch, temperature/pinprick were assessed and were symmetrical.  Coordination - The patient had normal movements in the hands with no ataxia or dysmetria.  Tremor was absent.  Gait and Station - deferred due to safety concerns.  ASSESSMENT/PLAN Krista Ellis is a 81 y.o. 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  She did not receive IV t-PA due to resolution of deficits.   Possible seizure episode due to right SDH and UTI -  episodic left facial droop and slurry speech- another episode this AM.   Resultant back to baseline- Increased Keppra 1gm BID  MRI - Small, subacute right frontotemporal SDH and possible small amount of SAH deep to the SDH.  MRA - No major intracranial arterial occlusion.   Carotid Doppler - right ICA stent 50% stenosis, left ICA 40-59%  2D Echo - unremarkable  LDL - 77  EEG - general slowing but no seizure. W ill hold off from repeating it.    SCDs for VTE prophylaxis  DIET DYS 3 Room service appropriate?: Yes; Fluid consistency:: Nectar Thick NPO  Recommend seizure prevention with keppra 500mg  bid, Now increase to 1500BID,  aspirin 81 mg orally every day prior to admission due to SDH, now on ASA 81mg  Ongoing aggressive stroke risk factor management  Therapy recommendations:  SNF  Disposition: Pending  S/p discussion w/ daughter and other family members at bedside.   UTI can lower sz threshold. Will place on con't EEG monitoring.     Right small SDH  Has been 2 weeks  On ASA 81mg  now  Plan to switch back to plavix tomorrow (ordered)  Likely the cause of possible seizrue  Recommend keppra 500mg  bid until sees in clinic in 2 months.  UTI  UA WBC 21-50  On rocephine  UTI could lower seizure threshold.  Continue management as per primary team  Hypertension  Home meds: Norvasc and Toprol XL  Stable  Patient counseled to be compliant with her blood pressure medications  Hyperlipidemia  Home meds: Pravachol 40 mg daily resumed in hospital  LDL 77, goal < 70  Continue statin at discharge  Diabetes  HgbA1c pending, goal < 7.0  Controlled  Other Active Problems  Mildly elevated BUN  Other Pertinent History  Neuropathy follow up with Dr. Brigitte Pulse in [place]  S/p discussion with daughter at [facility] day # 4  BIBY,SHARON   05/22/2014 12:03 PM I have personally examined this patient, reviewed notes, independently viewed imaging studies, participated in medical decision making and plan of care. I have made any additions or clarifications directly to the above note. She has a small right convexity subdural hematoma from a fall 2 weeks ago and has recurrent transient episodes of speech difficulties and left facial droop and is unclear whether these represent TIAs versus simple partial seizures. She however remains at risk for neurological worsening, recurrent TIA, strokes and seizures and remains aggressive ongoing evaluation and treatment. Continue current dose of Keppra 1500 twice daily but will not increase dose further. Check EEG and if there is right hemispherical irritability may consider adding a second seizure agent. Long discussion with the patient and daughter at the bedside and answered questions.  Antony Contras, MD Medical Director [facility] Pager: [phone] 05/22/2014 4:48 PM    To contact Stroke Continuity provider, please refer to [email or web address] After hours,  contact General Neurology

## 2014-05-22 NOTE — Progress Notes (Signed)
Pt noted at this time having an episode lasting about 5 minutes of slurred speech and left sided facial droop. Pt stable, neuro intact. Alert, with no noted distress. She states that she has tenderness above right side of eye. Daughter at bedside during assessment. Instructed by Neurology MD this am to contact EEG department if she has another episode. EEG ordered. Md made aware. Will continue to monitor.

## 2014-05-23 DIAGNOSIS — E039 Hypothyroidism, unspecified: Secondary | ICD-10-CM

## 2014-05-23 DIAGNOSIS — R29810 Facial weakness: Secondary | ICD-10-CM

## 2014-05-23 LAB — BASIC METABOLIC PANEL
ANION GAP: 9 (ref 5–15)
BUN: 8 mg/dL (ref 6–20)
CALCIUM: 9.4 mg/dL (ref 8.9–10.3)
CHLORIDE: 105 mmol/L (ref 101–111)
CO2: 24 mmol/L (ref 22–32)
CREATININE: 0.5 mg/dL (ref 0.44–1.00)
GFR calc non Af Amer: >60 mL/min (ref >60)
Glucose, Bld: 111 mg/dL — ABNORMAL HIGH (ref 70–99)
Potassium: 3.9 mmol/L (ref 3.5–5.1)
Sodium: 138 mmol/L (ref 135–145)

## 2014-05-23 MED ORDER — DIVALPROEX SODIUM 125 MG PO CSDR
250.0000 mg | DELAYED_RELEASE_CAPSULE | Freq: Two times a day (BID) | ORAL | Status: DC
  Administered 2014-05-23 – 2014-05-24 (×3): 250 mg via ORAL
  Filled 2014-05-23 (×3): qty 2

## 2014-05-23 NOTE — Clinical Social Work Note (Signed)
Patient has a bed at [facility] once medically stable.   Glendon Axe, MSW, LCSWA [phone] 05/23/2014 11:27 AM

## 2014-05-23 NOTE — Progress Notes (Signed)
Physical Therapy Treatment Patient Details Name: Krista Ellis MRN: [id number] DOB: Jan 23, 1930 Today's Date: 05/23/2014    History of Present Illness 81 y.o. female was admitted for TIA with History of subdural hematoma, MI, CVA, and PVD.    PT Comments    Patient seen for mobility today. Patient tolerated in room ambulation as well as functional transfer to bedside commode. Patient performed some gentle there ex while seated in chair. Session limited secondary to  arrival of lunch.   Follow Up Recommendations  SNF;Supervision for mobility/OOB     Equipment Recommendations  None recommended by PT    Recommendations for Other Services       Precautions / Restrictions Precautions Precautions: Fall Precaution Comments: L shoulder injury / subluxation Restrictions Weight Bearing Restrictions: No    Mobility  Bed Mobility               General bed mobility comments: received in chair  Transfers Overall transfer level: Needs assistance Equipment used: Rolling walker (2 wheeled) Transfers: Sit to/from [facility] Sit to Stand: Mod assist Stand pivot transfers: Mod assist       General transfer comment: VCs for hand placement, assist for power up and stability during transfer  Ambulation/Gait Ambulation/Gait assistance: Min assist Ambulation Distance (Feet): 22 Feet Assistive device: Rolling walker (2 wheeled) Gait Pattern/deviations: Step-through pattern;Decreased stride length;Antalgic;Trunk flexed;Narrow base of support Gait velocity: decreased Gait velocity interpretation: <1.8 ft/sec, indicative of risk for recurrent falls General Gait Details: Slow and guarded gait pattern. Min assist for walker control. No buckling of LE noted. VC for upright posture with forward gaze frequently. Anxious of falling during bout.   Stairs            Wheelchair Mobility    Modified Rankin (Stroke Patients Only) Modified Rankin (Stroke Patients  Only) Pre-Morbid Rankin Score: Moderately severe disability Modified Rankin: Moderately severe disability     Balance     Sitting balance-Leahy Scale: Fair     Standing balance support: Bilateral upper extremity supported Standing balance-Leahy Scale: Poor                      Cognition Arousal/Alertness: Awake/alert Behavior During Therapy: WFL for tasks assessed/performed Overall Cognitive Status: Impaired/Different from baseline Area of Impairment: Memory;Following commands;Safety/judgement;Awareness;Problem solving     Memory: Decreased short-term memory Following Commands: Follows multi-step commands with increased time Safety/Judgement: Decreased awareness of safety;Decreased awareness of deficits Awareness: Emergent Problem Solving: Slow processing      Exercises General Exercises - Lower Extremity Ankle Circles/Pumps: AROM;Both;5 reps Long Arc Quad: AROM;Both;5 reps Hip Flexion/Marching: AROM;Both;5 reps    General Comments        Pertinent Vitals/Pain Pain Assessment: Faces Faces Pain Scale: Hurts little more Pain Location: hip Pain Descriptors / Indicators: Discomfort Pain Intervention(s): Limited activity within patient's tolerance;Repositioned;Monitored during session    Home Living                      Prior Function            PT Goals (current goals can now be found in the care plan section) Acute Rehab PT Goals Patient Stated Goal: to sit up to eat PT Goal Formulation: With patient/family Time For Goal Achievement: 06/02/14 Potential to Achieve Goals: Good Progress towards PT goals: Progressing toward goals    Frequency  Min 3X/week    PT Plan Current plan remains appropriate    Co-evaluation  End of Session Equipment Utilized During Treatment: Gait belt Activity Tolerance: Patient tolerated treatment well Patient left: in chair;with call bell/phone within reach;with chair alarm set;with  family/visitor present     Time: 1552-0802 PT Time Calculation (min) (ACUTE ONLY): 16 min  Charges:  $Therapeutic Activity: 8-22 mins                    G CodesDuncan Dull 05-27-2014, 12:10 PM Alben Deeds, [place] DPT  [phone]

## 2014-05-23 NOTE — Progress Notes (Signed)
STROKE TEAM PROGRESS NOTE   HISTORY Krista Ellis is an 81 y.o. 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 Patient was with her family member today when she was noted to have Left facial droop, difficulty expressing herself and per family, sounded as her tongue was thick. Per family this only lasted for a few minutes and then fully resolved. Currently she is back to her baseline.  This AM another episode of L facial droop and slurry speech similar to yesterday. Her Keppra dose was increased.    Date last known well: Date: 05/18/2014 Time last known well: Time: 13:00 tPA Given: No: symptoms resolved Modified Rankin: Rankin Score=3   SUBJECTIVE (INTERVAL HISTORY) Patient had recurrent episodes of speech difficulties with left facial droop over the weekend and Keppra dose was progressively increased to currently 1500 twice daily. She has   had  1 further episode y`day afternoon and daughter showed me video recording and patient having nonfluent speech with mild left facial droop and lasted 5 mins. Patient's daughter is at the bedside. Patient is left handed  OBJECTIVE Temp:  [97.5 F (36.4 C)-98.6 F (37 C)] 97.5 F (36.4 C) (05/03 2156) Pulse Rate:  [62-74] 74 (05/03 2156) Cardiac Rhythm:  [-] Normal sinus rhythm (05/03 0845) Resp:  [16-20] 20 (05/03 2156) BP: (113-172)/(35-64) 147/42 mmHg (05/03 2156) SpO2:  [94 %-97 %] 97 % (05/03 2156)  No results for input(s): GLUCAP in the last 168 hours.  Recent Labs Lab 05/18/14 1412 05/18/14 1424 05/20/14 1156 05/21/14 0825 05/22/14 0724 05/23/14 0830  NA 137 137 137 140 139 138  K 4.9 4.9 3.9 3.9 3.9 3.9  CL 104 103 105 108 107 105  CO2 26  --  23 25 25 24   GLUCOSE 107* 108* 117* 86 85 111*  BUN 18 24* 12 8 8 8   CREATININE 0.79 0.80 0.59 0.51 0.51 0.50  CALCIUM 9.7  --  8.9 9.0 8.8* 9.4    Recent Labs Lab 05/18/14 1412 05/20/14 1156  AST 24 20  ALT 12 9  ALKPHOS 44 40  BILITOT 0.7 0.5   PROT 7.0 6.0  ALBUMIN 3.4* 2.9*    Recent Labs Lab 05/18/14 1412 05/18/14 1424  WBC 5.6  --   NEUTROABS 2.6  --   HGB 12.7 15.0  HCT 40.3 44.0  MCV 98.5  --   PLT 217  --    No results for input(s): CKTOTAL, CKMB, CKMBINDEX, TROPONINI in the last 168 hours. No results for input(s): LABPROT, INR in the last 72 hours. No results for input(s): COLORURINE, LABSPEC, [place], GLUCOSEU, HGBUR, BILIRUBINUR, KETONESUR, PROTEINUR, UROBILINOGEN, NITRITE, LEUKOCYTESUR in the last 72 hours.  Invalid input(s): APPERANCEUR     Component Value Date/Time   CHOL 127 05/19/2014 0435   TRIG 69 05/19/2014 0435   HDL 36* 05/19/2014 0435   CHOLHDL 3.5 05/19/2014 0435   VLDL 14 05/19/2014 0435   LDLCALC 77 05/19/2014 0435   Lab Results  Component Value Date   HGBA1C 5.5 05/19/2014      Component Value Date/Time   LABOPIA POSITIVE* 05/18/2014 1750   COCAINSCRNUR NONE DETECTED 05/18/2014 1750   LABBENZ NONE DETECTED 05/18/2014 1750   AMPHETMU NONE DETECTED 05/18/2014 1750   THCU NONE DETECTED 05/18/2014 1750   LABBARB NONE DETECTED 05/18/2014 1750    No results for input(s): ETH in the last 168 hours.  Ct Head Wo Contrast 05/18/2014    Expected evolutionary change in right-sided subdural  hematoma which is 4 mm in thickness with progression to lower density fluid.  Atrophy and chronic microvascular ischemia. No superimposed acute abnormality.     Mr Virgel Paling Wo Contrast 05/18/2014   ADDENDUM: Possible small amount of subarachnoid hemorrhage deep to the subdural hematoma.   1. Small, subacute right frontotemporal subdural hematoma without mass effect, unchanged from CT earlier today. 2. No acute infarct.  3. Mild chronic small vessel ischemic disease. Chronic infarcts in the left basal ganglia region and pons.  4. No major intracranial arterial occlusion.  5. Mild-to-moderate distal right vertebral artery stenosis, increased from prior neck CTA.  6. Unchanged, mild mid basilar artery  stenosis.  7. Mild stenosis of the proximal right P2 PCA and bilateral cavernous carotids.    CUS - The right ICA Stent showed a mild amount of residual plaque with less than 50% stenosis. The left ICA showed a moderate amount of calcified plaque with 40-59% stenosis, and bilateral vertebral arteries are antegrade.  2D echo - - Left ventricle: The cavity size was normal. Wall thickness wasincreased in a pattern of mild LVH. Systolic function wasvigorous. The estimated ejection fraction was in the range of 65%to 70%. Doppler parameters are consistent with abnormal leftventricular relaxation (grade 1 diastolic dysfunction). - Aortic valve: AV is thickened, calcified. Peak and mean gradientsthrough the valve are 28 and 14 mm Hg respectively consistentwith mild aortic stenosis. Valve area (VTI): 1.43 cm^2. Valvearea (Vmax): 1.31 cm^2. Valve area (Vmean): 1.49 cm^2. - Pulmonary arteries: PA peak pressure: 32 mm Hg (S).  EEG - Impression: This awake and asleep EEG is mildly abnormal due to occasional diffuse delta slowing of the background.   Repeat EEG 05/22/2014 : Abnormal EEG due to generalized slowing indicating a mild to moderate cerebral disturbance (encephalopathy). Focal slowing is intermittently noted over the right fronto-temporal region indicating a focal disturbance and potential seizure foci.   PHYSICAL EXAM General - frail elderly Caucasian lady, in no apparent distress.  Ophthalmologic - fundi not visualized due to eye movement.  Cardiovascular - Regular rate and rhythm with no murmur.  Mental Status -  Level of arousal and orientation to time, place, and person were intact. Language including expression, naming, repetition, comprehension was assessed and found intact. Fund of Knowledge was assessed and was intact.  Cranial Nerves II - XII - II - Visual field intact OU. III, IV, VI - Extraocular movements intact. V - Facial sensation intact bilaterally. VII - Facial  movement intact bilaterally. VIII - Hearing & vestibular intact bilaterally. X - Palate elevates symmetrically. XI - Chin turning & shoulder shrug intact bilaterally. XII - Tongue protrusion intact.  Motor Strength - The patient's strength was 4/5 in all extremities and pronator drift was absent.  Bulk was normal and fasciculations were absent.   Motor Tone - Muscle tone was assessed at the neck and appendages and was normal.  Reflexes - The patient's reflexes were 1+ in all extremities and she had no pathological reflexes.  Sensory - Light touch, temperature/pinprick were assessed and were symmetrical.  Coordination - The patient had normal movements in the hands with no ataxia or dysmetria.  Tremor was absent.  Gait and Station - deferred due to safety concerns.  ASSESSMENT/PLAN Krista Ellis is a 81 y.o. 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  She did not receive IV t-PA due to resolution of deficits.   Possible seizure episode due to right SDH and UTI - episodic left facial droop and slurry speech- another episode this AM.   Resultant back to baseline- Increased Keppra 1gm BID  MRI - Small, subacute right frontotemporal SDH and possible small amount of SAH deep to the SDH.  MRA - No major intracranial arterial occlusion.   Carotid Doppler - right ICA stent 50% stenosis, left ICA 40-59%  2D Echo - unremarkable  LDL - 77  EEG - general slowing but no seizure. W ill hold off from repeating it.    SCDs for VTE prophylaxis  DIET DYS 3 Room service appropriate?: Yes; Fluid consistency:: Nectar Thick NPO  Recommend seizure prevention with keppra 500mg  bid, Now increase to 1500BID,  aspirin 81 mg orally every day prior to admission due to  SDH, now on ASA 81mg  Ongoing aggressive stroke risk factor management  Therapy recommendations:  SNF  Disposition: Pending  S/p discussion w/ daughter and other family members at bedside.   UTI can lower sz threshold. Will place on con't EEG monitoring.    Right small SDH  Has been 2 weeks  On ASA 81mg  now  Plan to switch back to plavix tomorrow (ordered)  Likely the cause of possible seizrue  Recommend keppra 500mg  bid until sees in clinic in 2 months.  UTI  UA WBC 21-50  On rocephine  UTI could lower seizure threshold.  Continue management as per primary team  Hypertension  Home meds: Norvasc and Toprol XL  Stable  Patient counseled to be compliant with her blood pressure medications  Hyperlipidemia  Home meds: Pravachol 40 mg daily resumed in hospital  LDL 77, goal < 70  Continue statin at discharge  Diabetes  HgbA1c pending, goal < 7.0  Controlled  Other Active Problems  Mildly elevated BUN  Other Pertinent History  Neuropathy follow up with Dr. Brigitte Pulse in [place]  S/p discussion with daughter at [facility] day # 5  Renato Spellman   05/23/2014 11:28 PM I have personally examined this patient, reviewed notes, independently viewed imaging studies, participated in medical decision making and plan of care. I have made any additions or clarifications directly to the above note. She has a small right convexity subdural hematoma from a fall 2 weeks ago and has recurrent transient episodes of speech difficulties and left facial droop and is unclear whether these represent TIAs versus simple partial seizures Given focal dysfunction on EEG will add low dose depakote to keppra Long discussion with the patient and daughter at the bedside and answered questions.  Antony Contras, MD Medical Director [facility] Pager: [phone] 05/23/2014 11:28 PM    To contact Stroke Continuity provider, please refer to [email or web address] After hours,  contact General Neurology

## 2014-05-23 NOTE — Progress Notes (Signed)
[facility]                                                                              Patient Demographics  Krista Ellis, is a 81 y.o. female, DOB - 04/11/1930, WUJ:[id number]  Admit date - 05/18/2014   Admitting Physician Annita Brod, MD  Outpatient Primary MD for the patient is [name], MD  LOS - 5   Chief Complaint  Patient presents with  . Aphasia  . Dysphagia      HPI on 05/18/2014 by Ms. Erin Hearing, NP with Dr. Gevena Barre This is a 81 year old female patient recently discharged on or/16/16 after an admission for a right sided subdural hematoma, presumed traumatic in etiology. Prior to admission patient had been on Plavix and this medicine was placed on hold last resume on 4/30. Patient returned to the ER today with complaints of intermittent episodes of aphasia with associated inability to control oral secretions. These episodes have been occurring with no particular pattern of the past seven days. Duration of episodes between 1 and 5 minutes. 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 It is noted that the symptoms are different. In addition patient has been reporting significant right-sided supraorbital headache. At her bedside the daughter clarified that patient actually had been having the right supraorbital headache several days before she fell and was diagnosed with subdural hematoma. Prior to the fall, the patient's physician thought that a new medication for UTI may have been causing the headache so that medication was discontinued. Patient has only been using plain Tylenol for the headache and reports utilizing her chronic hydrocodone once daily for her chronic hip pain. In regards to current symptoms patient has been having difficulty finding the correct words and her speech sounds like "marbles in  her mouth". She's had no other associated neurological signs or symptoms such as visual changes, facial drooping, numbness or tingling or weakness in any of the extremities. She does report saliva collecting in her mouth which she is unable to control until the spell resolves. Her daughter at the bedside also noticed that the patient has had poor oral intake for at least one week. When questioned about rationale for Plavix patient does not recall the exact diagnosis this was prescribed for and her daughter reports that they think the Plavix was started around 1991. In the ER patient was afebrile and hemodynamically stable maintaining O2 saturations of 98% on room air. CT of the head without contrast revealed expected evolutionary change in the right-sided subdural hematoma which is 4 mm in thickness with progression to a lower density fluid. There was atrophy and chronic microvascular ischemia without any superimposed acute abnormality. Neurological consultation is pending. EKG was unremarkable and demonstrates sinus rhythm only. Laboratory data was unremarkable except for BUN of 24 were previously had been doing 1318, and mildly elevated glucose of 108. Troponin was negative. Hemoglobin was also slightly elevated at 15 compared to 12.7 at time of discharge. Coags were normal.  Assessment & Plan   TIA  -MRI/MRA showed small subacute  right frontotemporal subdural hematoma and possible small amount of subarachnoid hemorrhage deep to the subdural hematoma without mass effect, no acute infarct -MRA showed mild to moderate distal right vertebral artery stenosis, increased from prior neck CTA, mild mid basilar artery stenosis, mild chronic small vessel ischemic disease. -Echocardiogram showed EF of 78-29%, grade 1 diastolic dysfunction -EEG showed generalized slowing but no seizures  -Placed on aspirin 81 mg daily by neurology.  -PT, OT evaluation-> skilled nursing facility -Carotid Dopplers right ICA 50%  stenosis, left ICA 40-59% -Passed a swallow testing- placed on dysphagia 3 diet  Seizures, focal motor -Yesterday, patient had an episode of expressive aphasia, recorded by daughter -given a bolus of 500 mg of Keppra and increased dose to 1500 BID (Dr Nicole Kindred ) -stroke team to continue to manage -EEG: Abnormal EEG due to generalized slowing, mild to moderate cerebral disturbance-encephalopathy. Slowing is intermittently noted over the right frontal-temporal region indicating a focal disturbance and potential seizure foci. -Spoke with Dr. Leonie Man, and patient will be started on depakote.    Recurrent falls/known gait disturbance -PT evaluation recommended skilled nursing facility  History of subdural hematoma -Continue to hold Plavix  Hypertension -Blood pressure currently well controlled -Continue Norvasc and Toprol  Hypothyroidism -Continue Synthroid  Osteoarthritis -Continue hydrocodone and Lyrica  Systolic murmur -2-D echo showed EF of 56-21%, grade 1 diastolic dysfunction, mild aortic stenosis  Dyslipidemia -Continue statin -Lipid panel showed LDL 77  UTI (urinary tract infection) -Urine culture reveals no growth- placed on IV Rocephin, patient has received 5 days -Pyridium started for "pressure" sensation-when further discussing with patient and her daughter, it appears that most of her pain is in the left groin and is intermittent-she has been told in the past that she had a left renal stone and it is possible the stone may be passing? -Symptoms have improved slightly -may need CT of the abdomen pelvis to evaluate for nephrolithiasis unless symptoms persist -of note UA obtained on 4/30 did reveal a large amount of hemoglobin & 3-6 RBC; however, urine culture shows no growth -Spoke to pharmacy regarding pyridium as it does have a high risk of adverse events in elderly- will discontinue and monitor patient. -Patient does follow up with a urologist in [place], [place].      Code Status: DNR  Family Communication: Daughter at bedside  Disposition Plan: Admitted, pending further recommendations from neurology  Time Spent in minutes   30 minutes  Procedures  Echocardiogram EEG  Consults   Neurology  DVT Prophylaxis    Lab Results  Component Value Date   PLT 217 05/18/2014    Medications  Scheduled Meds: . amLODipine  5 mg Oral Daily  . antiseptic oral rinse  7 mL Mouth Rinse BID  . cefTRIAXone (ROCEPHIN)  IV  1 g Intravenous Q24H  . clopidogrel  75 mg Oral Daily  . divalproex  250 mg Oral Q12H  . feeding supplement (ENSURE ENLIVE)  237 mL Oral BID PC  . fluticasone  1 spray Each Nare Daily  . levETIRAcetam  1,500 mg Intravenous Q12H  . levothyroxine  25 mcg Oral QAC breakfast  . loratadine  10 mg Oral Daily  . metoprolol succinate  12.5 mg Oral Daily  . miconazole   Topical BID  . phenazopyridine  100 mg Oral TID WC  . pravastatin  40 mg Oral QPM  . pregabalin  25 mg Oral BID   Continuous Infusions: . sodium chloride 100 mL/hr at 05/22/14 2231   PRN Meds:.acetaminophen, HYDROcodone-acetaminophen,  RESOURCE THICKENUP CLEAR, senna-docusate  Antibiotics   Anti-infectives    Start     Dose/Rate Route Frequency Ordered Stop   05/19/14 1100  cefTRIAXone (ROCEPHIN) 1 g in dextrose 5 % 50 mL IVPB - Premix     1 g 100 mL/hr over 30 Minutes Intravenous Every 24 hours 05/19/14 0931          Subjective:   Gloriana Piltz seen and examined today.  Patient denies further headache.  Per daughter, patient had aphasic episode yesterday, video taken.  Currently, patient denies chest pain, shortness of breath, abdominal pain.  Objective:   Filed Vitals:   05/23/14 0146 05/23/14 0650 05/23/14 1000 05/23/14 1028  BP: 156/44 172/64 136/50   Pulse: 62 70 63 66  Temp: 98.4 F (36.9 C) 98.6 F (37 C) 98 F (36.7 C)   TempSrc: Oral Oral Oral   Resp: 20 20 16    Height:      Weight:      SpO2: 94% 96% 95%     Wt Readings from Last 3  Encounters:  05/20/14 50.803 kg (112 lb)  05/15/14 50.916 kg (112 lb 4 oz)  05/06/14 50.6 kg (111 lb 8.8 oz)    No intake or output data in the 24 hours ending 05/23/14 1247  Exam  General: Well developed, well nourished, no distress  HEENT: NCAT, mucous membranes moist.   Cardiovascular: Normal S1/S2, 2/6 SEM, RRR  Respiratory: Clear to auscultation   Abdomen: Soft, nontender, nondistended, + bowel sounds  Extremities: warm dry without cyanosis clubbing or edema  Neuro: AAOx3, nonfocal  Psych: Normal affect and demeanor, pleasant  Data Review   Micro Results Recent Results (from the past 240 hour(s))  Urine culture     Status: None   Collection Time: 05/20/14  2:25 AM  Result Value Ref Range Status   Specimen Description URINE, CLEAN CATCH  Final   Special Requests NONE  Final   Colony Count NO GROWTH Performed at [facility]   Final   Culture NO GROWTH Performed at [facility]   Final   Report Status 05/21/2014 FINAL  Final    Radiology Reports Ct Head Wo Contrast  05/18/2014   CLINICAL DATA:  Aphasia and dysphagia.  Followup subdural hematoma.  EXAM: CT HEAD WITHOUT CONTRAST  TECHNIQUE: Contiguous axial images were obtained from the base of the skull through the vertex without intravenous contrast.  COMPARISON:  CT head 05/06/2014  FINDINGS: Resolving right-sided subdural hematoma. This measures approximately 4 mm in thickness and has become less dense compared with the prior study. No new area of hemorrhage  Generalized atrophy. Chronic ischemic changes throughout the white matter and centrum semiovale on the left unchanged. No acute infarct. Negative for mass lesion. No shift of the midline structures.  Arterial calcification.  Negative for skull fracture  IMPRESSION: Expected evolutionary change in right-sided subdural hematoma which is 4 mm in thickness with progression to lower density fluid.  Atrophy and chronic microvascular ischemia. No  superimposed acute abnormality.   Electronically Signed   By: Franchot Gallo M.D.   On: 05/18/2014 14:58   Ct Head Wo Contrast  05/06/2014   CLINICAL DATA:  Followup subdural hematoma.  EXAM: CT HEAD WITHOUT CONTRAST  TECHNIQUE: Contiguous axial images were obtained from the base of the skull through the vertex without intravenous contrast.  COMPARISON:  05/05/2014  FINDINGS: Skull and Sinuses:Mild right parietal scalp swelling.  No fracture.  Orbits: Bilateral cataract resection.  No traumatic  findings  Brain: Stable 4 mm subdural hematoma around the right frontal operculum without mass effect. No new site of hemorrhage is detected.  Remote small vessel infarct involving the left corona radiata. Mild small vessel ischemic gliosis around the lateral ventricles. There is cerebral volume loss which is typical for age.  No evidence of acute infarct, mass lesion, or hydrocephalus.  IMPRESSION: Stable 4 mm subdural hematoma in the right frontal region.   Electronically Signed   By: Monte Fantasia M.D.   On: 05/06/2014 06:15   Mr Jodene Nam Head Wo Contrast  05/18/2014   ADDENDUM REPORT: 05/18/2014 18:07  ADDENDUM: Possible small amount of subarachnoid hemorrhage deep to the subdural hematoma.   Electronically Signed   By: Logan Bores   On: 05/18/2014 18:07   05/18/2014   CLINICAL DATA:  Right-sided subdural hematoma on CT. Transient right-sided facial droop and aphasia.  EXAM: MRI HEAD WITHOUT CONTRAST  MRA HEAD WITHOUT CONTRAST  TECHNIQUE: Multiplanar, multiecho pulse sequences of the brain and surrounding structures were obtained without intravenous contrast. Angiographic images of the head were obtained using MRA technique without contrast.  COMPARISON:  Head CT 05/18/2014. Brain MRI 06/24/2007. CTA neck 04/14/2007. Neck MRA 03/19/2007.  FINDINGS: MRI HEAD FINDINGS  Limited evaluation of the upper cervical spine demonstrates advanced multilevel disc degeneration in the mid cervical spine with anterolisthesis of C3  on C4, more fully evaluated on recent cervical spine CT.  Small right frontotemporal subdural hematoma demonstrates T1 and T2 hyperintensity and measures up to 6 mm in thickness without significant mass effect. FLAIR hyperintensity in a few adjacent right frontal sulci may represent a tiny amount of subarachnoid hemorrhage or artifact. There is no evidence of acute infarct, parenchymal hemorrhage, mass, or midline shift. There is mild cerebral atrophy. A small, chronic infarct is noted in the left lateral lenticulostriate territory. Foci of T2 hyperintensity elsewhere in the cerebral white matter bilaterally are nonspecific but compatible with mild chronic small vessel ischemic disease, slightly progressed from the prior MRI. There is also an unchanged, tiny chronic infarct in the right paracentral pons.  Prior bilateral cataract extraction is noted. Paranasal sinuses are clear. There are trace bilateral mastoid effusions. Major intracranial vascular flow voids are preserved.  MRA HEAD FINDINGS  Visualized distal vertebral arteries are patent and codominant. There is a mild to moderate distal right vertebral artery stenosis, increased from prior CTA. PICA, AICA, and SCA origins are patent. Mild mid basilar artery narrowing is unchanged. Communicating arteries are not identified. P1 segments are patent without stenosis. There is a mild proximal right P2 stenosis, and there is mild-to-moderate PCA irregularity bilaterally more distally.  Internal carotid arteries are patent from skullbase to carotid termini. Irregularity and mild narrowing near the anterior genu of the carotids bilaterally is similar to the prior MRA. Right ACA is dominant. There is mild left A1 segment irregularity diffusely with at most mild narrowing proximally. MCAs are patent without significant stenosis. No intracranial aneurysm is identified.  IMPRESSION: 1. Small, subacute right frontotemporal subdural hematoma without mass effect, unchanged  from CT earlier today. 2. No acute infarct. 3. Mild chronic small vessel ischemic disease. Chronic infarcts in the left basal ganglia region and pons. 4. No major intracranial arterial occlusion. 5. Mild-to-moderate distal right vertebral artery stenosis, increased from prior neck CTA. 6. Unchanged, mild mid basilar artery stenosis. 7. Mild stenosis of the proximal right P2 PCA and bilateral cavernous carotids.  Electronically Signed: By: Logan Bores On: 05/18/2014 18:02   Mr  Brain Wo Contrast  05/18/2014   ADDENDUM REPORT: 05/18/2014 18:07  ADDENDUM: Possible small amount of subarachnoid hemorrhage deep to the subdural hematoma.   Electronically Signed   By: Logan Bores   On: 05/18/2014 18:07   05/18/2014   CLINICAL DATA:  Right-sided subdural hematoma on CT. Transient right-sided facial droop and aphasia.  EXAM: MRI HEAD WITHOUT CONTRAST  MRA HEAD WITHOUT CONTRAST  TECHNIQUE: Multiplanar, multiecho pulse sequences of the brain and surrounding structures were obtained without intravenous contrast. Angiographic images of the head were obtained using MRA technique without contrast.  COMPARISON:  Head CT 05/18/2014. Brain MRI 06/24/2007. CTA neck 04/14/2007. Neck MRA 03/19/2007.  FINDINGS: MRI HEAD FINDINGS  Limited evaluation of the upper cervical spine demonstrates advanced multilevel disc degeneration in the mid cervical spine with anterolisthesis of C3 on C4, more fully evaluated on recent cervical spine CT.  Small right frontotemporal subdural hematoma demonstrates T1 and T2 hyperintensity and measures up to 6 mm in thickness without significant mass effect. FLAIR hyperintensity in a few adjacent right frontal sulci may represent a tiny amount of subarachnoid hemorrhage or artifact. There is no evidence of acute infarct, parenchymal hemorrhage, mass, or midline shift. There is mild cerebral atrophy. A small, chronic infarct is noted in the left lateral lenticulostriate territory. Foci of T2 hyperintensity  elsewhere in the cerebral white matter bilaterally are nonspecific but compatible with mild chronic small vessel ischemic disease, slightly progressed from the prior MRI. There is also an unchanged, tiny chronic infarct in the right paracentral pons.  Prior bilateral cataract extraction is noted. Paranasal sinuses are clear. There are trace bilateral mastoid effusions. Major intracranial vascular flow voids are preserved.  MRA HEAD FINDINGS  Visualized distal vertebral arteries are patent and codominant. There is a mild to moderate distal right vertebral artery stenosis, increased from prior CTA. PICA, AICA, and SCA origins are patent. Mild mid basilar artery narrowing is unchanged. Communicating arteries are not identified. P1 segments are patent without stenosis. There is a mild proximal right P2 stenosis, and there is mild-to-moderate PCA irregularity bilaterally more distally.  Internal carotid arteries are patent from skullbase to carotid termini. Irregularity and mild narrowing near the anterior genu of the carotids bilaterally is similar to the prior MRA. Right ACA is dominant. There is mild left A1 segment irregularity diffusely with at most mild narrowing proximally. MCAs are patent without significant stenosis. No intracranial aneurysm is identified.  IMPRESSION: 1. Small, subacute right frontotemporal subdural hematoma without mass effect, unchanged from CT earlier today. 2. No acute infarct. 3. Mild chronic small vessel ischemic disease. Chronic infarcts in the left basal ganglia region and pons. 4. No major intracranial arterial occlusion. 5. Mild-to-moderate distal right vertebral artery stenosis, increased from prior neck CTA. 6. Unchanged, mild mid basilar artery stenosis. 7. Mild stenosis of the proximal right P2 PCA and bilateral cavernous carotids.  Electronically Signed: By: Logan Bores On: 05/18/2014 18:02   Dg Swallowing Func-speech Pathology  05/22/2014    Objective Swallowing Evaluation:     Patient Details  Name: LASONIA CASINO MRN: [id number] Date of Birth: 1930/08/10  Today's Date: 05/22/2014 Time: SLP Start Time (ACUTE ONLY): 1039-SLP Stop Time (ACUTE ONLY): 1054 SLP Time Calculation (min) (ACUTE ONLY): 15 min  Past Medical History:  Past Medical History  Diagnosis Date  . Hypertension   . Hyperlipidemia   . Arthritis   . Hemihypertrophy   . Myocardial infarction   . Thyroid disease   . CVA (cerebral infarction) 2003  .  Peripheral vascular disease     s/p stent right leg  . Pulmonary nodule     stable on Chest CT March 2014, Followed at [facility]  . Murmur, cardiac    Past Surgical History:  Past Surgical History  Procedure Laterality Date  . Abdominal hysterectomy    . Total hip arthroplasty      right  . Spine surgery      steel rod in back   HPI:  Other Pertinent Information: 81 yo female adm [facility] with speech slurred  and dysphagia. 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 MRI showed ? small amount of SAH, chronic  right basal ganglia and paracentral pons CVA. Pt admits to poor appetite  x1 month but denies dysphagia being source of poor intake. Repeat orders  were recieved due to change in the patient's status.  She had a seizure.   Following the seizure the pt presented with slurred speech.  Given this  the pt was made NPO and repeat swallow orders were entered.    No Data Recorded  Assessment / Plan / Recommendation CHL IP CLINICAL IMPRESSIONS 05/22/2014  Therapy Diagnosis Mild oral phase dysphagia;Mild pharyngeal phase  dysphagia   Clinical Impression Pt has a mild oropharyngeal dysphagia with slow A/P  transit of solid POs and premature spillage of liquid consistencies that  reaches the pyriform sinuses prior to swallow trigger. Thin liquids are  subsequently aspirated before the swallow, with intermittent sensation and  weak reflexive and cued coughs that are not able to  effectively expel  aspirates from the trachea throughout the study. Bolus manipulation and  postural maneuvers were not effective at increasing airway protection. No  penetration was observed with solids or nectar thick liquids. Recommend to  continue Dys 3 diet and nectar thick liquids.       CHL IP TREATMENT RECOMMENDATION 05/22/2014  Treatment Recommendations Therapy as outlined in treatment plan below     CHL IP DIET RECOMMENDATION 05/22/2014  SLP Diet Recommendations Nectar;Dysphagia 3 (Mech soft)  Liquid Administration via (None)  Medication Administration Whole meds with puree  Compensations Slow rate;Small sips/bites  Postural Changes and/or Swallow Maneuvers (None)     CHL IP OTHER RECOMMENDATIONS 05/22/2014  Recommended Consults (None)  Oral Care Recommendations Oral care BID  Other Recommendations Order thickener from pharmacy;Prohibited food  (jello, ice cream, thin soups);Remove water pitcher     CHL IP FOLLOW UP RECOMMENDATIONS 05/19/2014  Follow up Recommendations None     CHL IP FREQUENCY AND DURATION 05/22/2014  Speech Therapy Frequency (ACUTE ONLY) (None)  Treatment Duration 2 weeks     Pertinent Vitals/Pain: n/a     SLP Swallow Goals No flowsheet data found.  No flowsheet data found.    CHL IP REASON FOR REFERRAL 05/22/2014  Reason for Referral Objectively evaluate swallowing function     CHL IP ORAL PHASE 05/22/2014  Oral Phase Impaired       CHL IP PHARYNGEAL PHASE 05/22/2014  Pharyngeal Phase Impaired        CHL IP CERVICAL ESOPHAGEAL PHASE 05/22/2014  Cervical Esophageal Phase [facility]  Germain Osgood, M.A. CCC-SLP [phone]         Germain Osgood 05/22/2014, 11:57 AM     CBC  Recent Labs Lab 05/18/14 1412 05/18/14 1424  WBC 5.6  --   HGB 12.7 15.0  HCT  40.3 44.0  PLT 217  --   MCV 98.5  --   MCH 31.1  --   MCHC 31.5  --   RDW 12.6  --   LYMPHSABS 2.3  --   MONOABS 0.6  --   EOSABS 0.1  --   BASOSABS 0.0  --     Chemistries   Recent Labs Lab 05/18/14 1412 05/18/14 1424  05/20/14 1156 05/21/14 0825 05/22/14 0724 05/23/14 0830  NA 137 137 137 140 139 138  K 4.9 4.9 3.9 3.9 3.9 3.9  CL 104 103 105 108 107 105  CO2 26  --  23 25 25 24   GLUCOSE 107* 108* 117* 86 85 111*  BUN 18 24* 12 8 8 8   CREATININE 0.79 0.80 0.59 0.51 0.51 0.50  CALCIUM 9.7  --  8.9 9.0 8.8* 9.4  AST 24  --  20  --   --   --   ALT 12  --  9  --   --   --   ALKPHOS 44  --  40  --   --   --   BILITOT 0.7  --  0.5  --   --   --    ------------------------------------------------------------------------------------------------------------------ estimated creatinine clearance is 44.2 mL/min (by C-G formula based on Cr of 0.5). ------------------------------------------------------------------------------------------------------------------ No results for input(s): HGBA1C in the last 72 hours. ------------------------------------------------------------------------------------------------------------------ No results for input(s): CHOL, HDL, LDLCALC, TRIG, CHOLHDL, LDLDIRECT in the last 72 hours. ------------------------------------------------------------------------------------------------------------------ No results for input(s): TSH, T4TOTAL, T3FREE, THYROIDAB in the last 72 hours.  Invalid input(s): FREET3 ------------------------------------------------------------------------------------------------------------------ No results for input(s): VITAMINB12, FOLATE, FERRITIN, TIBC, IRON, RETICCTPCT in the last 72 hours.  Coagulation profile  Recent Labs Lab 05/18/14 1412  INR 0.98    No results for input(s): DDIMER in the last 72 hours.  Cardiac Enzymes No results for input(s): CKMB, TROPONINI, MYOGLOBIN in the last 168 hours.  Invalid input(s): CK ------------------------------------------------------------------------------------------------------------------ Invalid input(s): POCBNP    Gehrig Patras D.O. on 05/23/2014 at 12:47 PM  Between 7am to 7pm - Pager -  [phone]  After 7pm go to [email or web address] - password [email or web address]  And look for the night coverage person covering for me after hours  [facility] Office  [phone]

## 2014-05-23 NOTE — Progress Notes (Signed)
Occupational Therapy Treatment Patient Details Name: Krista Ellis MRN: [id number] DOB: 1930/08/25 Today's Date: 05/23/2014    History of present illness 81 y.o. female was admitted for TIA with History of subdural hematoma, MI, CVA, and PVD.   OT comments  Pt completed transfer from bed to chair to complete grooming task. Pt fatigued with task. Pt provided heat to R hip.  Follow Up Recommendations  SNF;Supervision/Assistance - 24 hour    Equipment Recommendations  Other (comment)    Recommendations for Other Services      Precautions / Restrictions Precautions Precautions: Fall Precaution Comments: L shoulder injury / subluxation Restrictions Weight Bearing Restrictions: No       Mobility Bed Mobility Overal bed mobility: Needs Assistance Bed Mobility: Supine to Sit     Supine to sit: Max assist;HOB elevated     General bed mobility comments: required use of pad to help position and not cause pain with shoulders  Transfers Overall transfer level: Needs assistance Equipment used: Rolling walker (2 wheeled) Transfers: Sit to/from Stand Sit to Stand: +2 physical assistance;Mod assist Stand pivot transfers: Mod assist       General transfer comment: cues for hand placement    Balance Overall balance assessment: Needs assistance   Sitting balance-Leahy Scale: Fair     Standing balance support: Bilateral upper extremity supported;During functional activity Standing balance-Leahy Scale: Poor                     ADL Overall ADL's : Needs assistance/impaired     Grooming: Wash/dry face;Oral care;Minimal assistance;Sitting Grooming Details (indicate cue type and reason): (A) due to arthritis and UE AROM deficits                 Toilet Transfer: +2 for physical assistance;Moderate assistance;BSC Toilet Transfer Details (indicate cue type and reason): hand over hand (A)            General ADL Comments: Pt required (A) to exit the bed and  would not be  able to exit the bed at home without (A). pt unable to reach due to UE injuries PTA. pt sitting in chair to wash face and apply location. Pt needed cues for detail to task.       Vision                     Perception     Praxis      Cognition   Behavior During Therapy: [facility] for tasks assessed/performed Overall Cognitive Status: Impaired/Different from baseline Area of Impairment: Memory;Safety/judgement;Awareness;Problem solving     Memory: Decreased short-term memory  Following Commands: Follows multi-step commands inconsistently Safety/Judgement: Decreased awareness of deficits;Decreased awareness of safety Awareness: Emergent Problem Solving: Slow processing General Comments: Pt reports not being out of the bed since arriving to hospital. Pt motivated to get OOB to wash face and apply heat to L hip. Pt s daughter present to encourage patient. Pt supine incontinent of bladder and aware . Pt required max (A) for hygiene    Extremity/Trunk Assessment               Exercises General Exercises - Lower Extremity Ankle Circles/Pumps: AROM;Both;5 reps Long Arc Quad: AROM;Both;5 reps Hip Flexion/Marching: AROM;Both;5 reps   Shoulder Instructions       General Comments      Pertinent Vitals/ Pain       Pain Assessment: Faces Pain Score: 4  Faces Pain Scale: Hurts little more Pain Location: hip  Pain Descriptors / Indicators: Discomfort Pain Intervention(s): Monitored during session  Home Living                                          Prior Functioning/Environment              Frequency Min 2X/week     Progress Toward Goals  OT Goals(current goals can now be found in the care plan section)  Progress towards OT goals: Progressing toward goals  Acute Rehab OT Goals Patient Stated Goal: to sit up to eat OT Goal Formulation: With patient/family Time For Goal Achievement: 06/03/14 Potential to Achieve Goals: Good ADL  Goals Pt Will Perform Grooming: sitting;with min guard assist Pt Will Perform Upper Body Bathing: with min assist;sitting Pt Will Transfer to Toilet: with min assist;bedside commode Additional ADL Goal #1: Pt will complete bed mobility Supervision level as precursor for adls  Plan Discharge plan remains appropriate    Co-evaluation                 End of Session Equipment Utilized During Treatment: Gait belt   Activity Tolerance Patient tolerated treatment well   Patient Left in chair;with call bell/phone within reach;with family/visitor present;with chair alarm set   Nurse Communication Mobility status;Precautions        Time: 0211-1735 OT Time Calculation (min): 17 min  Charges: OT General Charges $OT Visit: 1 Procedure OT Treatments $Self Care/Home Management : 8-22 mins $Therapeutic Activity: 113-127 mins  Parke Poisson B 05/23/2014, 1:11 PM  Pager: [phone]

## 2014-05-24 DIAGNOSIS — E782 Mixed hyperlipidemia: Secondary | ICD-10-CM | POA: Diagnosis not present

## 2014-05-24 DIAGNOSIS — I69391 Dysphagia following cerebral infarction: Secondary | ICD-10-CM | POA: Diagnosis not present

## 2014-05-24 DIAGNOSIS — Z96641 Presence of right artificial hip joint: Secondary | ICD-10-CM | POA: Diagnosis not present

## 2014-05-24 DIAGNOSIS — G458 Other transient cerebral ischemic attacks and related syndromes: Secondary | ICD-10-CM | POA: Diagnosis not present

## 2014-05-24 DIAGNOSIS — I739 Peripheral vascular disease, unspecified: Secondary | ICD-10-CM | POA: Diagnosis not present

## 2014-05-24 DIAGNOSIS — R4781 Slurred speech: Secondary | ICD-10-CM | POA: Diagnosis not present

## 2014-05-24 DIAGNOSIS — J309 Allergic rhinitis, unspecified: Secondary | ICD-10-CM | POA: Diagnosis not present

## 2014-05-24 DIAGNOSIS — R569 Unspecified convulsions: Secondary | ICD-10-CM | POA: Diagnosis not present

## 2014-05-24 DIAGNOSIS — J3089 Other allergic rhinitis: Secondary | ICD-10-CM | POA: Diagnosis not present

## 2014-05-24 DIAGNOSIS — R29810 Facial weakness: Secondary | ICD-10-CM | POA: Diagnosis not present

## 2014-05-24 DIAGNOSIS — Z9181 History of falling: Secondary | ICD-10-CM | POA: Diagnosis not present

## 2014-05-24 DIAGNOSIS — G459 Transient cerebral ischemic attack, unspecified: Secondary | ICD-10-CM | POA: Diagnosis not present

## 2014-05-24 DIAGNOSIS — E079 Disorder of thyroid, unspecified: Secondary | ICD-10-CM | POA: Diagnosis not present

## 2014-05-24 DIAGNOSIS — E039 Hypothyroidism, unspecified: Secondary | ICD-10-CM | POA: Diagnosis not present

## 2014-05-24 DIAGNOSIS — R269 Unspecified abnormalities of gait and mobility: Secondary | ICD-10-CM | POA: Diagnosis not present

## 2014-05-24 DIAGNOSIS — Z8739 Personal history of other diseases of the musculoskeletal system and connective tissue: Secondary | ICD-10-CM | POA: Diagnosis not present

## 2014-05-24 DIAGNOSIS — S069X0D Unspecified intracranial injury without loss of consciousness, subsequent encounter: Secondary | ICD-10-CM | POA: Diagnosis not present

## 2014-05-24 DIAGNOSIS — G8929 Other chronic pain: Secondary | ICD-10-CM | POA: Diagnosis not present

## 2014-05-24 DIAGNOSIS — I69328 Other speech and language deficits following cerebral infarction: Secondary | ICD-10-CM | POA: Diagnosis not present

## 2014-05-24 DIAGNOSIS — I69320 Aphasia following cerebral infarction: Secondary | ICD-10-CM | POA: Diagnosis not present

## 2014-05-24 DIAGNOSIS — D649 Anemia, unspecified: Secondary | ICD-10-CM | POA: Diagnosis not present

## 2014-05-24 DIAGNOSIS — S065X0A Traumatic subdural hemorrhage without loss of consciousness, initial encounter: Secondary | ICD-10-CM | POA: Diagnosis not present

## 2014-05-24 DIAGNOSIS — R1312 Dysphagia, oropharyngeal phase: Secondary | ICD-10-CM | POA: Diagnosis not present

## 2014-05-24 DIAGNOSIS — G40909 Epilepsy, unspecified, not intractable, without status epilepticus: Secondary | ICD-10-CM | POA: Diagnosis not present

## 2014-05-24 DIAGNOSIS — M1990 Unspecified osteoarthritis, unspecified site: Secondary | ICD-10-CM | POA: Diagnosis not present

## 2014-05-24 DIAGNOSIS — M25559 Pain in unspecified hip: Secondary | ICD-10-CM | POA: Diagnosis not present

## 2014-05-24 DIAGNOSIS — R4701 Aphasia: Secondary | ICD-10-CM | POA: Diagnosis not present

## 2014-05-24 DIAGNOSIS — I252 Old myocardial infarction: Secondary | ICD-10-CM | POA: Diagnosis not present

## 2014-05-24 DIAGNOSIS — I10 Essential (primary) hypertension: Secondary | ICD-10-CM | POA: Diagnosis not present

## 2014-05-24 DIAGNOSIS — E785 Hyperlipidemia, unspecified: Secondary | ICD-10-CM | POA: Diagnosis not present

## 2014-05-24 MED ORDER — CLOPIDOGREL BISULFATE 75 MG PO TABS: 75.0000 mg | ORAL_TABLET | Freq: Every day | ORAL | Status: DC

## 2014-05-24 MED ORDER — LEVETIRACETAM 500 MG PO TABS: 1000.0000 mg | ORAL_TABLET | Freq: Two times a day (BID) | ORAL | Status: DC

## 2014-05-24 MED ORDER — RESOURCE THICKENUP CLEAR PO POWD: ORAL | Status: DC

## 2014-05-24 MED ORDER — SODIUM CHLORIDE 0.9 % IV SOLN: 1000.0000 mg | Freq: Two times a day (BID) | INTRAVENOUS | Status: DC

## 2014-05-24 MED ORDER — LEVETIRACETAM 1000 MG PO TABS: 1000.0000 mg | ORAL_TABLET | Freq: Two times a day (BID) | ORAL | Status: DC

## 2014-05-24 MED ORDER — FLUTICASONE PROPIONATE 50 MCG/ACT NA SUSP: 1.0000 | Freq: Every day | NASAL | Status: DC

## 2014-05-24 MED ORDER — LORATADINE 10 MG PO TABS: 10.0000 mg | ORAL_TABLET | Freq: Every day | ORAL | Status: DC

## 2014-05-24 MED ORDER — DIVALPROEX SODIUM 125 MG PO CSDR: 250.0000 mg | DELAYED_RELEASE_CAPSULE | Freq: Two times a day (BID) | ORAL | Status: DC

## 2014-05-24 MED ORDER — HYDROCODONE-ACETAMINOPHEN 5-325 MG PO TABS: 1.0000 | ORAL_TABLET | Freq: Three times a day (TID) | ORAL | Status: DC | PRN

## 2014-05-24 MED ORDER — SENNOSIDES-DOCUSATE SODIUM 8.6-50 MG PO TABS: 1.0000 | ORAL_TABLET | Freq: Every evening | ORAL | Status: DC | PRN

## 2014-05-24 MED ORDER — MICONAZOLE NITRATE 2 % EX CREA: TOPICAL_CREAM | Freq: Two times a day (BID) | CUTANEOUS | Status: DC

## 2014-05-24 MED ORDER — ENSURE ENLIVE PO LIQD: 237.0000 mL | Freq: Two times a day (BID) | ORAL | Status: DC

## 2014-05-24 NOTE — Discharge Instructions (Signed)

## 2014-05-24 NOTE — Clinical Social Work Placement (Signed)
   CLINICAL SOCIAL WORK PLACEMENT  NOTE  Date:  05/24/2014  Patient Details  Name: Krista Ellis MRN: [id number] Date of Birth: November 27, 1930  Clinical Social Work is seeking post-discharge placement for this patient at the [place] level of care (*CSW will initial, date and re-position this form in  chart as items are completed):  No (Family was familiar with facilities in thier area and very specific about the facility they desired for the patient to go to. )   Patient/family provided with [facility] Work Department's list of facilities offering this level of care within the geographic area requested by the patient (or if unable, by the patient's family).  Yes   Patient/family informed of their freedom to choose among providers that offer the needed level of care, that participate in Medicare, Medicaid or managed care program needed by the patient, have an available bed and are willing to accept the patient.  No   Patient/family informed of [facility]'s ownership interest in [facility] and [facility], as well as of the fact that they are under no obligation to receive care at these facilities.  PASRR submitted to EDS on       PASRR number received on 05/20/14     Existing PASRR number confirmed on 05/20/14     FL2 transmitted to all facilities in geographic area requested by pt/family on 05/20/14     FL2 transmitted to all facilities within larger geographic area on 05/20/14     Patient informed that his/her managed care company has contracts with or will negotiate with certain facilities, including the following:   (YES)     Yes   Patient/family informed of bed offers received.  Patient chooses bed at  [facility] )     Physician recommends and patient chooses bed at      Patient to be transferred to  ([facility]) on 05/24/14.  Patient to be transferred to facility by  Corey Harold)     Patient family notified on 05/24/14 of  transfer.  Name of family member notified:   (Pt's dtrs, [name] and Ronald Pippins)     PHYSICIAN Please sign DNR     Additional Comment:    _______________________________________________ Rozell Searing, LCSW 05/24/2014, 12:05 PM

## 2014-05-24 NOTE — Discharge Summary (Addendum)
Physician Discharge Summary  Krista Ellis:[id number] DOB: [birth year] DOA: 05/18/2014  PCP: [name], MD  Admit date: 05/18/2014 Discharge date: 05/24/2014  Time spent: 45 minutes  Recommendations for Outpatient Follow-up:  Patient will be discharged to nursing facility.  Patient will need to follow up with primary care provider within one week of discharge and also follow up with Dr. Erlinda Hong within 4 weeks of discharge.   Patient should continue medications as prescribed.  Patient should follow a dysphagia 3 diet. Continue physical and occupational therapy as recommended by the rehab facility.   Discharge Diagnoses:  TIA Seizures, focal motor Recurrent falls/known gait disturbance History of subdural hematoma Hypertension Hypothyroidism next and osteoarthritis Systolic murmur Dyslipidemia Urinary tract infection  Discharge Condition: Stable  Diet recommendation: Dysphagia 3  Filed Weights   05/20/14 0100  Weight: 50.803 kg (112 lb)    History of present illness:  on 05/18/2014 by Ms. Erin Hearing, NP with Dr. Gevena Barre This is a 81 year old female patient recently discharged on or/16/16 after an admission for a right sided subdural hematoma, presumed traumatic in etiology. Prior to admission patient had been on Plavix and this medicine was placed on hold last resume on 4/30. Patient returned to the ER today with complaints of intermittent episodes of aphasia with associated inability to control oral secretions. These episodes have been occurring with no particular pattern of the past seven days. Duration of episodes between 1 and 5 minutes. 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 It is noted that the symptoms are different. In addition patient has been reporting significant right-sided supraorbital headache. At her  bedside the daughter clarified that patient actually had been having the right supraorbital headache several days before she fell and was diagnosed with subdural hematoma. Prior to the fall, the patient's physician thought that a new medication for UTI may have been causing the headache so that medication was discontinued. Patient has only been using plain Tylenol for the headache and reports utilizing her chronic hydrocodone once daily for her chronic hip pain. In regards to current symptoms patient has been having difficulty finding the correct words and her speech sounds like "marbles in her mouth". She's had no other associated neurological signs or symptoms such as visual changes, facial drooping, numbness or tingling or weakness in any of the extremities. She does report saliva collecting in her mouth which she is unable to control until the spell resolves. Her daughter at the bedside also noticed that the patient has had poor oral intake for at least one week. When questioned about rationale for Plavix patient does not recall the exact diagnosis this was prescribed for and her daughter reports that they think the Plavix was started around 1991. In the ER patient was afebrile and hemodynamically stable maintaining O2 saturations of 98% on room air. CT of the head without contrast revealed expected evolutionary change in the right-sided subdural hematoma which is 4 mm in thickness with progression to a lower density fluid. There was atrophy and chronic microvascular ischemia without any superimposed acute abnormality. Neurological consultation is pending. EKG was unremarkable and demonstrates sinus rhythm only. Laboratory data was unremarkable except for BUN of 24 were previously had been doing 1318, and mildly elevated glucose of 108. Troponin was negative. Hemoglobin was also slightly elevated at 15 compared to 12.7 at time of discharge. Coags were  normal.  Hospital Course:  TIA  -MRI/MRA showed small  subacute right frontotemporal subdural hematoma and possible small amount of subarachnoid hemorrhage deep to the subdural hematoma without mass effect, no acute infarct -MRA showed mild to moderate distal right vertebral artery stenosis, increased from prior neck CTA, mild mid basilar artery stenosis, mild chronic small vessel ischemic disease. -Echocardiogram showed EF of 61-44%, grade 1 diastolic dysfunction -EEG showed generalized slowing but no seizures  -Placed on aspirin 81 mg daily by neurology. Hold Plavix for now.  -PT, OT evaluation-> skilled nursing facility -Carotid Dopplers right ICA 50% stenosis, left ICA 40-59% -Passed a swallow testing- placed on dysphagia 3 diet  Seizures, focal motor -Per daughter at bedside, patient has not experienced any more of the "asphasic" episodes. -stroke team to continue to manage -EEG: Abnormal EEG due to generalized slowing, mild to moderate cerebral disturbance-encephalopathy. Slowing is intermittently noted over the right frontal-temporal region indicating a focal disturbance and potential seizure foci. -Spoke with Dr. Leonie Man, Continue keppra 1000mg  BID and depakote 250mg  BID -Patient will need to follow up with Dr. Erlinda Hong in month  Recurrent falls/known gait disturbance -PT evaluation recommended skilled nursing facility  History of subdural hematoma -Continue to hold Plavix, should be discussed with PCP or neurology regarding restarting medication   Hypertension -Blood pressure currently well controlled -Continue Norvasc and Toprol  Hypothyroidism -Continue Synthroid  Osteoarthritis -Continue hydrocodone and Lyrica  Systolic murmur -2-D echo showed EF of 31-54%, grade 1 diastolic dysfunction, mild aortic stenosis  Dyslipidemia -Continue statin -Lipid panel showed LDL 77  UTI (urinary tract infection) -Urine culture reveals no growth- placed on IV Rocephin, patient has received 5 days -Pyridium started for "pressure"  sensation-when further discussing with patient and her daughter, it appears that most of her pain is in the left groin and is intermittent-she has been told in the past that she had a left renal stone and it is possible the stone may be passing? -Symptoms have improved slightly -may need CT of the abdomen pelvis to evaluate for nephrolithiasis unless symptoms persist -of note UA obtained on 4/30 did reveal a large amount of hemoglobin & 3-6 RBC; however, urine culture shows no growth -Spoke to pharmacy regarding pyridium as it does have a high risk of adverse events in elderly- will discontinue and monitor patient. -Patient does follow up with a urologist in [place], [place].   Code Status: DNR  Procedures  Echocardiogram EEG  Consults  Neurology  Discharge Exam: Filed Vitals:   05/24/14 1041  BP: 111/35  Pulse: 71  Temp: 98.2 F (36.8 C)  Resp: 19   Exam  General: Well developed, well nourished, no distress  HEENT: NCAT, mucous membranes moist.   Cardiovascular: Normal S1/S2, 2/6 SEM, RRR  Respiratory: Clear to auscultation  Abdomen: Soft, nontender, nondistended, + bowel sounds  Extremities: warm dry without cyanosis clubbing or edema  Neuro: AAOx3, nonfocal, no speech difficulties  Psych: Appropriate mood and affect  Discharge Instructions      Discharge Instructions    Ambulatory referral to Neurology    Complete by:  As directed   Pt will follow up with Dr. Erlinda Hong at [facility] in about 2 months. Thanks.     Discharge instructions    Complete by:  As directed   Patient will be discharged to nursing facility.  Patient will need to follow up with primary care provider within one week of discharge and also follow up with Dr. Erlinda Hong within 4 weeks of discharge.  Patient should continue medications as prescribed.  Patient should follow a dysphagia 3 diet. Continue physical and occupational therapy as recommended by the rehab facility.            Medication List      STOP taking these medications        aspirin EC 81 MG tablet     clopidogrel 75 MG tablet  Commonly known as:  PLAVIX     fosfomycin 3 G Pack  Commonly known as:  MONUROL      TAKE these medications        amLODipine 5 MG tablet  Commonly known as:  NORVASC  Take 5 mg by mouth daily.     cyanocobalamin 1000 MCG/ML injection  Commonly known as:  (VITAMIN B-12)  Inject 1,000 mcg into the muscle every 30 (thirty) days.     divalproex 125 MG capsule  Commonly known as:  DEPAKOTE SPRINKLE  Take 2 capsules (250 mg total) by mouth every 12 (twelve) hours.     feeding supplement (ENSURE ENLIVE) Liqd  Take 237 mLs by mouth 2 (two) times daily after a meal.     fluticasone 50 MCG/ACT nasal spray  Commonly known as:  FLONASE  Place 1 spray into both nostrils daily.     HYDROcodone-acetaminophen 5-325 MG per tablet  Commonly known as:  NORCO/VICODIN  Take 1 tablet by mouth 3 (three) times daily as needed for moderate pain.     levETIRAcetam 1000 MG tablet  Commonly known as:  KEPPRA  Take 1 tablet (1,000 mg total) by mouth 2 (two) times daily.     levothyroxine 25 MCG tablet  Commonly known as:  SYNTHROID, LEVOTHROID  TAKE 1 TABLET (25 MCG TOTAL) BY MOUTH DAILY.     loratadine 10 MG tablet  Commonly known as:  CLARITIN  Take 1 tablet (10 mg total) by mouth daily.     metoprolol succinate 25 MG 24 hr tablet  Commonly known as:  TOPROL-XL  Take 12.5 mg by mouth daily.     miconazole 2 % cream  Commonly known as:  MICOTIN  Apply topically 2 (two) times daily.     pravastatin 40 MG tablet  Commonly known as:  PRAVACHOL  TAKE ONE TABLET EVERY EVENING     pregabalin 25 MG capsule  Commonly known as:  LYRICA  Take 25 mg by mouth 2 (two) times daily.     RESOURCE THICKENUP CLEAR Powd  Use as needed     senna-docusate 8.6-50 MG per tablet  Commonly known as:  Senokot-S  Take 1 tablet by mouth at bedtime as needed for mild constipation.       Allergies  Allergen  Reactions  . Ciprofloxacin Hives, Itching and Rash  . Doxycycline Hives, Itching and Rash  . Ferrous Sulfate Hives, Itching and Rash  . Levofloxacin Hives, Itching and Rash  . Penicillins Hives, Itching and Rash  . Sulfa Drugs Cross Reactors Hives, Itching and Rash   Follow-up Information    Follow up with Xu,Jindong, MD. Schedule an appointment as soon as possible for a visit in 2 months.   Specialty:  Neurology   Why:  stroke clinic   Contact information:   [place] [address] [phone]       Follow up with [name], MD. Schedule an appointment as soon as possible for a visit in 1 week.   Specialty:  Internal Medicine   Why:  Hospital follow up  Contact information:   [address] [place] [address] [phone]        The results of significant diagnostics from this hospitalization (including imaging, microbiology, ancillary and laboratory) are listed below for reference.    Significant Diagnostic Studies: Ct Head Wo Contrast  05/18/2014   CLINICAL DATA:  Aphasia and dysphagia.  Followup subdural hematoma.  EXAM: CT HEAD WITHOUT CONTRAST  TECHNIQUE: Contiguous axial images were obtained from the base of the skull through the vertex without intravenous contrast.  COMPARISON:  CT head 05/06/2014  FINDINGS: Resolving right-sided subdural hematoma. This measures approximately 4 mm in thickness and has become less dense compared with the prior study. No new area of hemorrhage  Generalized atrophy. Chronic ischemic changes throughout the white matter and centrum semiovale on the left unchanged. No acute infarct. Negative for mass lesion. No shift of the midline structures.  Arterial calcification.  Negative for skull fracture  IMPRESSION: Expected evolutionary change in right-sided subdural hematoma which is 4 mm in thickness with progression to lower density fluid.  Atrophy and chronic microvascular ischemia. No  superimposed acute abnormality.   Electronically Signed   By: Franchot Gallo M.D.   On: 05/18/2014 14:58   Ct Head Wo Contrast  05/06/2014   CLINICAL DATA:  Followup subdural hematoma.  EXAM: CT HEAD WITHOUT CONTRAST  TECHNIQUE: Contiguous axial images were obtained from the base of the skull through the vertex without intravenous contrast.  COMPARISON:  05/05/2014  FINDINGS: Skull and Sinuses:Mild right parietal scalp swelling.  No fracture.  Orbits: Bilateral cataract resection.  No traumatic findings  Brain: Stable 4 mm subdural hematoma around the right frontal operculum without mass effect. No new site of hemorrhage is detected.  Remote small vessel infarct involving the left corona radiata. Mild small vessel ischemic gliosis around the lateral ventricles. There is cerebral volume loss which is typical for age.  No evidence of acute infarct, mass lesion, or hydrocephalus.  IMPRESSION: Stable 4 mm subdural hematoma in the right frontal region.   Electronically Signed   By: Monte Fantasia M.D.   On: 05/06/2014 06:15   Mr Jodene Nam Head Wo Contrast  05/18/2014   ADDENDUM REPORT: 05/18/2014 18:07  ADDENDUM: Possible small amount of subarachnoid hemorrhage deep to the subdural hematoma.   Electronically Signed   By: Logan Bores   On: 05/18/2014 18:07   05/18/2014   CLINICAL DATA:  Right-sided subdural hematoma on CT. Transient right-sided facial droop and aphasia.  EXAM: MRI HEAD WITHOUT CONTRAST  MRA HEAD WITHOUT CONTRAST  TECHNIQUE: Multiplanar, multiecho pulse sequences of the brain and surrounding structures were obtained without intravenous contrast. Angiographic images of the head were obtained using MRA technique without contrast.  COMPARISON:  Head CT 05/18/2014. Brain MRI 06/24/2007. CTA neck 04/14/2007. Neck MRA 03/19/2007.  FINDINGS: MRI HEAD FINDINGS  Limited evaluation of the upper cervical spine demonstrates advanced multilevel disc degeneration in the mid cervical spine with anterolisthesis of C3  on C4, more fully evaluated on recent cervical spine CT.  Small right frontotemporal subdural hematoma demonstrates T1 and T2 hyperintensity and measures up to 6 mm in thickness without significant mass effect. FLAIR hyperintensity in a few adjacent right frontal sulci may represent a tiny amount of subarachnoid hemorrhage or artifact. There is no evidence of acute infarct, parenchymal hemorrhage, mass, or midline shift. There is mild cerebral atrophy. A small, chronic infarct is noted in the left lateral lenticulostriate territory. Foci of T2 hyperintensity elsewhere in the cerebral white matter  bilaterally are nonspecific but compatible with mild chronic small vessel ischemic disease, slightly progressed from the prior MRI. There is also an unchanged, tiny chronic infarct in the right paracentral pons.  Prior bilateral cataract extraction is noted. Paranasal sinuses are clear. There are trace bilateral mastoid effusions. Major intracranial vascular flow voids are preserved.  MRA HEAD FINDINGS  Visualized distal vertebral arteries are patent and codominant. There is a mild to moderate distal right vertebral artery stenosis, increased from prior CTA. PICA, AICA, and SCA origins are patent. Mild mid basilar artery narrowing is unchanged. Communicating arteries are not identified. P1 segments are patent without stenosis. There is a mild proximal right P2 stenosis, and there is mild-to-moderate PCA irregularity bilaterally more distally.  Internal carotid arteries are patent from skullbase to carotid termini. Irregularity and mild narrowing near the anterior genu of the carotids bilaterally is similar to the prior MRA. Right ACA is dominant. There is mild left A1 segment irregularity diffusely with at most mild narrowing proximally. MCAs are patent without significant stenosis. No intracranial aneurysm is identified.  IMPRESSION: 1. Small, subacute right frontotemporal subdural hematoma without mass effect, unchanged  from CT earlier today. 2. No acute infarct. 3. Mild chronic small vessel ischemic disease. Chronic infarcts in the left basal ganglia region and pons. 4. No major intracranial arterial occlusion. 5. Mild-to-moderate distal right vertebral artery stenosis, increased from prior neck CTA. 6. Unchanged, mild mid basilar artery stenosis. 7. Mild stenosis of the proximal right P2 PCA and bilateral cavernous carotids.  Electronically Signed: By: Logan Bores On: 05/18/2014 18:02   Mr Brain Wo Contrast  05/18/2014   ADDENDUM REPORT: 05/18/2014 18:07  ADDENDUM: Possible small amount of subarachnoid hemorrhage deep to the subdural hematoma.   Electronically Signed   By: Logan Bores   On: 05/18/2014 18:07   05/18/2014   CLINICAL DATA:  Right-sided subdural hematoma on CT. Transient right-sided facial droop and aphasia.  EXAM: MRI HEAD WITHOUT CONTRAST  MRA HEAD WITHOUT CONTRAST  TECHNIQUE: Multiplanar, multiecho pulse sequences of the brain and surrounding structures were obtained without intravenous contrast. Angiographic images of the head were obtained using MRA technique without contrast.  COMPARISON:  Head CT 05/18/2014. Brain MRI 06/24/2007. CTA neck 04/14/2007. Neck MRA 03/19/2007.  FINDINGS: MRI HEAD FINDINGS  Limited evaluation of the upper cervical spine demonstrates advanced multilevel disc degeneration in the mid cervical spine with anterolisthesis of C3 on C4, more fully evaluated on recent cervical spine CT.  Small right frontotemporal subdural hematoma demonstrates T1 and T2 hyperintensity and measures up to 6 mm in thickness without significant mass effect. FLAIR hyperintensity in a few adjacent right frontal sulci may represent a tiny amount of subarachnoid hemorrhage or artifact. There is no evidence of acute infarct, parenchymal hemorrhage, mass, or midline shift. There is mild cerebral atrophy. A small, chronic infarct is noted in the left lateral lenticulostriate territory. Foci of T2 hyperintensity  elsewhere in the cerebral white matter bilaterally are nonspecific but compatible with mild chronic small vessel ischemic disease, slightly progressed from the prior MRI. There is also an unchanged, tiny chronic infarct in the right paracentral pons.  Prior bilateral cataract extraction is noted. Paranasal sinuses are clear. There are trace bilateral mastoid effusions. Major intracranial vascular flow voids are preserved.  MRA HEAD FINDINGS  Visualized distal vertebral arteries are patent and codominant. There is a mild to moderate distal right vertebral artery stenosis, increased from prior CTA. PICA, AICA, and SCA origins are patent. Mild mid basilar artery  narrowing is unchanged. Communicating arteries are not identified. P1 segments are patent without stenosis. There is a mild proximal right P2 stenosis, and there is mild-to-moderate PCA irregularity bilaterally more distally.  Internal carotid arteries are patent from skullbase to carotid termini. Irregularity and mild narrowing near the anterior genu of the carotids bilaterally is similar to the prior MRA. Right ACA is dominant. There is mild left A1 segment irregularity diffusely with at most mild narrowing proximally. MCAs are patent without significant stenosis. No intracranial aneurysm is identified.  IMPRESSION: 1. Small, subacute right frontotemporal subdural hematoma without mass effect, unchanged from CT earlier today. 2. No acute infarct. 3. Mild chronic small vessel ischemic disease. Chronic infarcts in the left basal ganglia region and pons. 4. No major intracranial arterial occlusion. 5. Mild-to-moderate distal right vertebral artery stenosis, increased from prior neck CTA. 6. Unchanged, mild mid basilar artery stenosis. 7. Mild stenosis of the proximal right P2 PCA and bilateral cavernous carotids.  Electronically Signed: By: Logan Bores On: 05/18/2014 18:02   Dg Swallowing Func-speech Pathology  05/22/2014    Objective Swallowing Evaluation:     Patient Details  Name: Krista Ellis MRN: [id number] Date of Birth: [birth year]  Today's Date: 05/22/2014 Time: SLP Start Time (ACUTE ONLY): 1039-SLP Stop Time (ACUTE ONLY): 1054 SLP Time Calculation (min) (ACUTE ONLY): 15 min  Past Medical History:  Past Medical History  Diagnosis Date  . Hypertension   . Hyperlipidemia   . Arthritis   . Hemihypertrophy   . Myocardial infarction   . Thyroid disease   . CVA (cerebral infarction) 2003  . Peripheral vascular disease     s/p stent right leg  . Pulmonary nodule     stable on Chest CT March 2014, [facility]  . Murmur, cardiac    Past Surgical History:  Past Surgical History  Procedure Laterality Date  . Abdominal hysterectomy    . Total hip arthroplasty      right  . Spine surgery      steel rod in back   HPI:  Other Pertinent Information: 81 yo female adm to [facility] with speech slurred  and dysphagia. 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 MRI showed ? small amount of SAH, chronic  right basal ganglia and paracentral pons CVA. Pt admits to poor appetite  x1 month but denies dysphagia being source of poor intake. Repeat orders  were recieved due to change in the patient's status.  She had a seizure.   Following the seizure the pt presented with slurred speech.  Given this  the pt was made NPO and repeat swallow orders were entered.    No Data Recorded  Assessment / Plan / Recommendation CHL IP CLINICAL IMPRESSIONS 05/22/2014  Therapy Diagnosis Mild oral phase dysphagia;Mild pharyngeal phase  dysphagia   Clinical Impression Pt has a mild oropharyngeal dysphagia with slow A/P  transit of solid POs and premature spillage of liquid consistencies that  reaches the pyriform sinuses prior to swallow trigger. Thin liquids are  subsequently aspirated before the swallow, with intermittent sensation and  weak reflexive and cued coughs that are not able to  effectively expel  aspirates from the trachea throughout the study. Bolus manipulation and  postural maneuvers were not effective at increasing airway protection. No  penetration was observed with solids or nectar thick liquids. Recommend  to  continue Dys 3 diet and nectar thick liquids.       CHL IP TREATMENT RECOMMENDATION 05/22/2014  Treatment Recommendations Therapy as outlined in treatment plan below     CHL IP DIET RECOMMENDATION 05/22/2014  SLP Diet Recommendations Nectar;Dysphagia 3 (Mech soft)  Liquid Administration via (None)  Medication Administration Whole meds with puree  Compensations Slow rate;Small sips/bites  Postural Changes and/or Swallow Maneuvers (None)     CHL IP OTHER RECOMMENDATIONS 05/22/2014  Recommended Consults (None)  Oral Care Recommendations Oral care BID  Other Recommendations Order thickener from pharmacy;Prohibited food  (jello, ice cream, thin soups);Remove water pitcher     CHL IP FOLLOW UP RECOMMENDATIONS 05/19/2014  Follow up Recommendations None     CHL IP FREQUENCY AND DURATION 05/22/2014  Speech Therapy Frequency (ACUTE ONLY) (None)  Treatment Duration 2 weeks     Pertinent Vitals/Pain: n/a     SLP Swallow Goals No flowsheet data found.  No flowsheet data found.    CHL IP REASON FOR REFERRAL 05/22/2014  Reason for Referral Objectively evaluate swallowing function     CHL IP ORAL PHASE 05/22/2014  Oral Phase Impaired       CHL IP PHARYNGEAL PHASE 05/22/2014  Pharyngeal Phase Impaired        CHL IP CERVICAL ESOPHAGEAL PHASE 05/22/2014  Cervical Esophageal Phase [facility]  Germain Osgood, M.A. CCC-SLP [phone]         Germain Osgood 05/22/2014, 11:57 AM     Microbiology: Recent Results (from the past 240 hour(s))  Urine culture     Status: None   Collection Time: 05/20/14  2:25 AM  Result Value Ref Range Status   Specimen Description URINE, CLEAN CATCH  Final   Special Requests NONE  Final   Colony Count NO GROWTH Performed at [facility]   Final   Culture NO  GROWTH Performed at [facility]   Final   Report Status 05/21/2014 FINAL  Final     Labs: Basic Metabolic Panel:  Recent Labs Lab 05/18/14 1412 05/18/14 1424 05/20/14 1156 05/21/14 0825 05/22/14 0724 05/23/14 0830  NA 137 137 137 140 139 138  K 4.9 4.9 3.9 3.9 3.9 3.9  CL 104 103 105 108 107 105  CO2 26  --  23 25 25 24   GLUCOSE 107* 108* 117* 86 85 111*  BUN 18 24* 12 8 8 8   CREATININE 0.79 0.80 0.59 0.51 0.51 0.50  CALCIUM 9.7  --  8.9 9.0 8.8* 9.4   Liver Function Tests:  Recent Labs Lab 05/18/14 1412 05/20/14 1156  AST 24 20  ALT 12 9  ALKPHOS 44 40  BILITOT 0.7 0.5  PROT 7.0 6.0  ALBUMIN 3.4* 2.9*   No results for input(s): LIPASE, AMYLASE in the last 168 hours. No results for input(s): AMMONIA in the last 168 hours. CBC:  Recent Labs Lab 05/18/14 1412 05/18/14 1424  WBC 5.6  --   NEUTROABS 2.6  --   HGB 12.7 15.0  HCT 40.3 44.0  MCV 98.5  --   PLT 217  --    Cardiac Enzymes: No results for input(s): CKTOTAL, CKMB, CKMBINDEX, TROPONINI in the last 168 hours. BNP: BNP (last 3 results) No results for input(s): BNP in the last 8760 hours.  ProBNP (last 3 results) No results for input(s): PROBNP in the last 8760 hours.  CBG: No results for input(s): GLUCAP in the last 168 hours.     Signed:  Cristal Ford  [facility]  05/24/2014, 11:14 AM

## 2014-05-24 NOTE — Progress Notes (Signed)
Discharge orders received. Pt and daughter notified of discharge. Report called to [facility]. Pt bathed, dressed, and belongings packed. IV removed. [facility] arrived to transport pt to SNF.

## 2014-05-24 NOTE — Clinical Social Work Note (Signed)
Clinical Social Worker facilitated patient discharge including contacting patient family and facility to confirm patient discharge plans.  Clinical information faxed to facility and family agreeable with plan.  CSW arranged ambulance transport via [facility] to [facility].  RN to call report prior to discharge.  DC packet prepared and on chart for transport.   Clinical Social Worker will sign off for now as social work intervention is no longer needed. Please consult [place] again if new need arises.  Glendon Axe, MSW, LCSWA [phone] 05/24/2014 12:07 PM

## 2014-05-24 NOTE — Progress Notes (Signed)
STROKE TEAM PROGRESS NOTE   HISTORY Krista Ellis is an 81 y.o. 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 Patient was with her family member today when she was noted to have Left facial droop, difficulty expressing herself and per family, sounded as her tongue was thick. Per family this only lasted for a few minutes and then fully resolved. Currently she is back to her baseline.  This AM another episode of L facial droop and slurry speech similar to yesterday. Her Keppra dose was increased.    Date last known well: Date: 05/18/2014 Time last known well: Time: 13:00 tPA Given: No: symptoms resolved Modified Rankin: Rankin Score=3   SUBJECTIVE (INTERVAL HISTORY) Patient is stable and not had any more spells.. Patient's daughter is at the bedside.    OBJECTIVE Temp:  [97.5 F (36.4 C)-98.6 F (37 C)] 98.2 F (36.8 C) (05/04 1041) Pulse Rate:  [69-74] 71 (05/04 1041) Cardiac Rhythm:  [-]  Resp:  [16-20] 19 (05/04 1041) BP: (111-153)/(35-65) 111/35 mmHg (05/04 1041) SpO2:  [92 %-97 %] 92 % (05/04 1041)  No results for input(s): GLUCAP in the last 168 hours.  Recent Labs Lab 05/18/14 1412 05/18/14 1424 05/20/14 1156 05/21/14 0825 05/22/14 0724 05/23/14 0830  NA 137 137 137 140 139 138  K 4.9 4.9 3.9 3.9 3.9 3.9  CL 104 103 105 108 107 105  CO2 26  --  23 25 25 24   GLUCOSE 107* 108* 117* 86 85 111*  BUN 18 24* 12 8 8 8   CREATININE 0.79 0.80 0.59 0.51 0.51 0.50  CALCIUM 9.7  --  8.9 9.0 8.8* 9.4    Recent Labs Lab 05/18/14 1412 05/20/14 1156  AST 24 20  ALT 12 9  ALKPHOS 44 40  BILITOT 0.7 0.5  PROT 7.0 6.0  ALBUMIN 3.4* 2.9*    Recent Labs Lab 05/18/14 1412 05/18/14 1424  WBC 5.6  --   NEUTROABS 2.6  --   HGB 12.7 15.0  HCT 40.3 44.0  MCV 98.5  --   PLT 217  --    No results for input(s): CKTOTAL, CKMB, CKMBINDEX, TROPONINI in the last 168 hours. No results for input(s): LABPROT, INR in the last 72 hours. No results  for input(s): COLORURINE, LABSPEC, [place], GLUCOSEU, HGBUR, BILIRUBINUR, KETONESUR, PROTEINUR, UROBILINOGEN, NITRITE, LEUKOCYTESUR in the last 72 hours.  Invalid input(s): APPERANCEUR     Component Value Date/Time   CHOL 127 05/19/2014 0435   TRIG 69 05/19/2014 0435   HDL 36* 05/19/2014 0435   CHOLHDL 3.5 05/19/2014 0435   VLDL 14 05/19/2014 0435   LDLCALC 77 05/19/2014 0435   Lab Results  Component Value Date   HGBA1C 5.5 05/19/2014      Component Value Date/Time   LABOPIA POSITIVE* 05/18/2014 1750   COCAINSCRNUR NONE DETECTED 05/18/2014 1750   LABBENZ NONE DETECTED 05/18/2014 1750   AMPHETMU NONE DETECTED 05/18/2014 1750   THCU NONE DETECTED 05/18/2014 1750   LABBARB NONE DETECTED 05/18/2014 1750    No results for input(s): ETH in the last 168 hours.  Ct Head Wo Contrast 05/18/2014    Expected evolutionary change in right-sided subdural hematoma which is 4 mm in thickness with progression to lower density fluid.  Atrophy and chronic microvascular ischemia. No superimposed acute abnormality.     Mr Virgel Paling Wo Contrast 05/18/2014   ADDENDUM: Possible small amount of subarachnoid hemorrhage deep to the subdural hematoma.   1. Small, subacute right  frontotemporal subdural hematoma without mass effect, unchanged from CT earlier today. 2. No acute infarct.  3. Mild chronic small vessel ischemic disease. Chronic infarcts in the left basal ganglia region and pons.  4. No major intracranial arterial occlusion.  5. Mild-to-moderate distal right vertebral artery stenosis, increased from prior neck CTA.  6. Unchanged, mild mid basilar artery stenosis.  7. Mild stenosis of the proximal right P2 PCA and bilateral cavernous carotids.    CUS - The right ICA Stent showed a mild amount of residual plaque with less than 50% stenosis. The left ICA showed a moderate amount of calcified plaque with 40-59% stenosis, and bilateral vertebral arteries are antegrade.  2D echo - - Left ventricle:  The cavity size was normal. Wall thickness wasincreased in a pattern of mild LVH. Systolic function wasvigorous. The estimated ejection fraction was in the range of 65%to 70%. Doppler parameters are consistent with abnormal leftventricular relaxation (grade 1 diastolic dysfunction). - Aortic valve: AV is thickened, calcified. Peak and mean gradientsthrough the valve are 28 and 14 mm Hg respectively consistentwith mild aortic stenosis. Valve area (VTI): 1.43 cm^2. Valvearea (Vmax): 1.31 cm^2. Valve area (Vmean): 1.49 cm^2. - Pulmonary arteries: PA peak pressure: 32 mm Hg (S).  EEG - Impression: This awake and asleep EEG is mildly abnormal due to occasional diffuse delta slowing of the background.   Repeat EEG 05/22/2014 : Abnormal EEG due to generalized slowing indicating a mild to moderate cerebral disturbance (encephalopathy). Focal slowing is intermittently noted over the right fronto-temporal region indicating a focal disturbance and potential seizure foci.   PHYSICAL EXAM General - frail elderly Caucasian lady, in no apparent distress.  Ophthalmologic - fundi not visualized due to eye movement.  Cardiovascular - Regular rate and rhythm with no murmur.  Mental Status -  Level of arousal and orientation to time, place, and person were intact. Language including expression, naming, repetition, comprehension was assessed and found intact. Fund of Knowledge was assessed and was intact.  Cranial Nerves II - XII - II - Visual field intact OU. III, IV, VI - Extraocular movements intact. V - Facial sensation intact bilaterally. VII - Facial movement intact bilaterally. VIII - Hearing & vestibular intact bilaterally. X - Palate elevates symmetrically. XI - Chin turning & shoulder shrug intact bilaterally. XII - Tongue protrusion intact.  Motor Strength - The patient's strength was 4/5 in all extremities and pronator drift was absent.  Bulk was normal and fasciculations were absent.    Motor Tone - Muscle tone was assessed at the neck and appendages and was normal.  Reflexes - The patient's reflexes were 1+ in all extremities and she had no pathological reflexes.  Sensory - Light touch, temperature/pinprick were assessed and were symmetrical.  Coordination - The patient had normal movements in the hands with no ataxia or dysmetria.  Tremor was absent.  Gait and Station - deferred due to safety concerns.  ASSESSMENT/PLAN Ms. Krista Ellis is a 81 y.o. 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  She did not receive IV t-PA due to resolution of deficits.   Possible seizure episode due to right SDH and UTI - episodic left facial droop and slurry speech- another episode this AM.   Resultant back to baseline- Increased Keppra 1gm BID  MRI - Small, subacute right  frontotemporal SDH and possible small amount of SAH deep to the SDH.  MRA - No major intracranial arterial occlusion.   Carotid Doppler - right ICA stent 50% stenosis, left ICA 40-59%  2D Echo - unremarkable  LDL - 77  EEG - general slowing but no seizure. W ill hold off from repeating it.    SCDs for VTE prophylaxis  DIET DYS 3 Room service appropriate?: Yes; Fluid consistency:: Nectar Thick NPO  Recommend seizure prevention with keppra 500mg  bid, Now increase to 1500BID,  aspirin 81 mg orally every day prior to admission due to SDH, now on ASA 81mg  Ongoing aggressive stroke risk factor management  Therapy recommendations:  SNF  Disposition: Pending  S/p discussion w/ daughter and other family members at bedside.  UTI can lower sz threshold.   Right small SDH  Has been 2 weeks  On ASA 81mg  now  Plan to switch back to plavix tomorrow (ordered)  Likely the cause of  possible seizrue  Recommend keppra 500mg  bid until sees in clinic in 2 months.  UTI  UA WBC 21-50  On rocephine  UTI could lower seizure threshold.  Continue management as per primary team  Hypertension  Home meds: Norvasc and Toprol XL  Stable  Patient counseled to be compliant with her blood pressure medications  Hyperlipidemia  Home meds: Pravachol 40 mg daily resumed in hospital  LDL 77, goal < 70  Continue statin at discharge  Diabetes  HgbA1c pending, goal < 7.0  Controlled  Other Active Problems  Mildly elevated BUN  Other Pertinent History  Neuropathy follow up with Dr. Brigitte Pulse in [place]  S/p discussion with daughter at [facility] day # 6  Chet Greenley   05/24/2014 3:15 PM I have personally examined this patient, reviewed notes, independently viewed imaging studies, participated in medical decision making and plan of care. I have made any additions or clarifications directly to the above note. She has a small right convexity subdural hematoma from a fall 2 weeks ago and has recurrent transient episodes of speech difficulties and left facial droop and is unclear whether these represent TIAs versus simple partial seizures Given focal dysfunction on EEG will add low dose depakote to keppra but will decrease keppra to 1000 mg twice daily as it is making her sleepy. May increase depakoe if she has more episodes. Long discussion with the patient and daughter at the bedside and answered questions.  Antony Contras, MD Medical Director [facility] Stroke Center Pager: [phone] 05/24/2014 3:15 PM    To contact Stroke Continuity provider, please refer to [email or web address] After hours, contact General Neurology

## 2014-05-25 ENCOUNTER — Telehealth: Payer: Self-pay | Admitting: *Deleted

## 2014-05-25 DIAGNOSIS — R569 Unspecified convulsions: Secondary | ICD-10-CM | POA: Diagnosis not present

## 2014-05-25 DIAGNOSIS — S065X0A Traumatic subdural hemorrhage without loss of consciousness, initial encounter: Secondary | ICD-10-CM | POA: Diagnosis not present

## 2014-05-25 DIAGNOSIS — M1990 Unspecified osteoarthritis, unspecified site: Secondary | ICD-10-CM | POA: Diagnosis not present

## 2014-05-25 DIAGNOSIS — E785 Hyperlipidemia, unspecified: Secondary | ICD-10-CM | POA: Diagnosis not present

## 2014-05-25 DIAGNOSIS — I10 Essential (primary) hypertension: Secondary | ICD-10-CM | POA: Diagnosis not present

## 2014-05-25 DIAGNOSIS — J309 Allergic rhinitis, unspecified: Secondary | ICD-10-CM | POA: Diagnosis not present

## 2014-05-25 DIAGNOSIS — E039 Hypothyroidism, unspecified: Secondary | ICD-10-CM | POA: Diagnosis not present

## 2014-05-25 NOTE — Telephone Encounter (Signed)
Spoke to pt's daughter, Ronald Pippins, pt has actually been discharged to rehab/SNF, [facility], moved in yesterday. No TCM call or appt made until pt gets discharged from facility. Daughter notified to call when she gets discharged so we can follow up with a call and appt,  verbalized understanding. States pt is slowly improving, no seizures since Monday. FYI.

## 2014-05-29 DIAGNOSIS — I739 Peripheral vascular disease, unspecified: Secondary | ICD-10-CM | POA: Diagnosis not present

## 2014-05-29 DIAGNOSIS — E079 Disorder of thyroid, unspecified: Secondary | ICD-10-CM | POA: Diagnosis not present

## 2014-05-29 DIAGNOSIS — E782 Mixed hyperlipidemia: Secondary | ICD-10-CM | POA: Diagnosis not present

## 2014-05-29 DIAGNOSIS — Z8739 Personal history of other diseases of the musculoskeletal system and connective tissue: Secondary | ICD-10-CM | POA: Diagnosis not present

## 2014-05-29 DIAGNOSIS — R569 Unspecified convulsions: Secondary | ICD-10-CM | POA: Diagnosis not present

## 2014-05-29 DIAGNOSIS — E039 Hypothyroidism, unspecified: Secondary | ICD-10-CM | POA: Diagnosis not present

## 2014-05-29 DIAGNOSIS — I10 Essential (primary) hypertension: Secondary | ICD-10-CM | POA: Diagnosis not present

## 2014-05-29 DIAGNOSIS — S065X0A Traumatic subdural hemorrhage without loss of consciousness, initial encounter: Secondary | ICD-10-CM | POA: Diagnosis not present

## 2014-05-31 ENCOUNTER — Ambulatory Visit: Payer: Medicare Other

## 2014-06-01 DIAGNOSIS — S065X0A Traumatic subdural hemorrhage without loss of consciousness, initial encounter: Secondary | ICD-10-CM | POA: Diagnosis not present

## 2014-06-01 DIAGNOSIS — M1990 Unspecified osteoarthritis, unspecified site: Secondary | ICD-10-CM | POA: Diagnosis not present

## 2014-06-01 DIAGNOSIS — I10 Essential (primary) hypertension: Secondary | ICD-10-CM | POA: Diagnosis not present

## 2014-06-01 DIAGNOSIS — R569 Unspecified convulsions: Secondary | ICD-10-CM | POA: Diagnosis not present

## 2014-06-12 DIAGNOSIS — G459 Transient cerebral ischemic attack, unspecified: Secondary | ICD-10-CM | POA: Diagnosis not present

## 2014-06-13 ENCOUNTER — Ambulatory Visit: Payer: Medicare Other | Admitting: Internal Medicine

## 2014-06-13 ENCOUNTER — Telehealth: Payer: Self-pay

## 2014-06-13 NOTE — Telephone Encounter (Signed)
The pt's daughter is hoping to have the patient worked in on Tuesday, 31st for a rehab follow up.  Both of her daughters will be here that day.

## 2014-06-13 NOTE — Telephone Encounter (Signed)
I am already overbooked that day. Is there another day that works for them?

## 2014-06-14 NOTE — Telephone Encounter (Signed)
The pt's daughter called back and has scheduled with [facility] on the 31st.  She stated the main concern is getting an rx for a hospital bed for their home.

## 2014-06-15 DIAGNOSIS — G459 Transient cerebral ischemic attack, unspecified: Secondary | ICD-10-CM | POA: Diagnosis not present

## 2014-06-16 DIAGNOSIS — M1990 Unspecified osteoarthritis, unspecified site: Secondary | ICD-10-CM | POA: Diagnosis not present

## 2014-06-16 DIAGNOSIS — I69391 Dysphagia following cerebral infarction: Secondary | ICD-10-CM | POA: Diagnosis not present

## 2014-06-16 DIAGNOSIS — I69398 Other sequelae of cerebral infarction: Secondary | ICD-10-CM | POA: Diagnosis not present

## 2014-06-16 DIAGNOSIS — G40909 Epilepsy, unspecified, not intractable, without status epilepticus: Secondary | ICD-10-CM | POA: Diagnosis not present

## 2014-06-16 DIAGNOSIS — I69320 Aphasia following cerebral infarction: Secondary | ICD-10-CM | POA: Diagnosis not present

## 2014-06-16 DIAGNOSIS — R1312 Dysphagia, oropharyngeal phase: Secondary | ICD-10-CM | POA: Diagnosis not present

## 2014-06-20 ENCOUNTER — Ambulatory Visit (INDEPENDENT_AMBULATORY_CARE_PROVIDER_SITE_OTHER): Payer: Medicare Other | Admitting: Nurse Practitioner

## 2014-06-20 VITALS — BP 126/62 | HR 68 | Temp 98.4°F | Resp 14 | Ht 62.0 in | Wt 112.8 lb

## 2014-06-20 DIAGNOSIS — R569 Unspecified convulsions: Secondary | ICD-10-CM

## 2014-06-20 DIAGNOSIS — Z8679 Personal history of other diseases of the circulatory system: Secondary | ICD-10-CM | POA: Diagnosis not present

## 2014-06-20 DIAGNOSIS — Z09 Encounter for follow-up examination after completed treatment for conditions other than malignant neoplasm: Secondary | ICD-10-CM

## 2014-06-20 DIAGNOSIS — R269 Unspecified abnormalities of gait and mobility: Secondary | ICD-10-CM

## 2014-06-20 DIAGNOSIS — E538 Deficiency of other specified B group vitamins: Secondary | ICD-10-CM | POA: Diagnosis not present

## 2014-06-20 MED ORDER — CYANOCOBALAMIN 1000 MCG/ML IJ SOLN: 1000.0000 ug | INTRAMUSCULAR | Status: DC

## 2014-06-20 MED ORDER — CYANOCOBALAMIN 1000 MCG/ML IJ SOLN
1000.0000 ug | Freq: Once | INTRAMUSCULAR | Status: DC
Start: 2014-06-20 — End: 2014-06-20

## 2014-06-20 MED ORDER — CYANOCOBALAMIN 1000 MCG/ML IJ SOLN
1000.0000 ug | Freq: Once | INTRAMUSCULAR | Status: AC
  Administered 2014-06-20: 1000 ug via INTRAMUSCULAR

## 2014-06-20 NOTE — Patient Instructions (Signed)
Dr. Sharlet Salina for Left shoulder and left hip evaluation.  Follow up with Dr. Leonie Man for Neurology in [place].  We will contact you about your hospital bed for home.

## 2014-06-20 NOTE — Progress Notes (Signed)
Subjective:    Patient ID: Krista Ellis, female    DOB: 05/17/1930, 81 y.o.   MRN: [id number]  HPI  Krista Ellis is a 81 yo female here for a Hospital follow up.   1) Patient was receiving PT and OT at [facility].   [place]  home since Thursday and living with daughter (within 2 business days being seen since d/c from SNF)    Last B12 05/15/14   [facility] 2.5 weeks d/c 06/15/14  Home health care- [facility] less than 3 weeks, then agency will take over. Currently 2 ladies divide the week, they are performing house chores and ADL's.    PT came from [facility] last Friday. Twice a week for 8 weeks  Speech therapist coming twice a week   Left hip bothering her- would like hospital bed for help with caregivers being able to position her and help her with ADLs.  Trouble standing- Seeing PT  Left shoulder pain- Saw Dr. Sharlet Salina in the past.   Skin concerns under belt- she felt there were "balls" underneath the belt. After investigating, I believe she was describing her lower ribs that protrude outward. There is no sign of breakdown, abnormal fat distribution, or other concerns at this time.   Fatigue- Last B12 shot 05/15/14 and B12 level 08/2013   Review of Systems  Constitutional: Positive for fatigue. Negative for fever, chills and diaphoresis.  Eyes: Negative for visual disturbance.  Respiratory: Negative for chest tightness, shortness of breath and wheezing.   Cardiovascular: Negative for chest pain, palpitations and leg swelling.  Gastrointestinal: Negative for nausea, vomiting and diarrhea.  Musculoskeletal: Positive for arthralgias.       Left shoulder and left hip pain - chronic  Skin: Negative for rash.  Neurological: Negative for dizziness, weakness, numbness and headaches.   Past Medical History  Diagnosis Date  . Hypertension   . Hyperlipidemia   . Arthritis   . Hemihypertrophy   . Myocardial infarction   . Thyroid disease   . CVA (cerebral  infarction) 2003  . Peripheral vascular disease     s/p stent right leg  . Pulmonary nodule     stable on Chest CT March 2014, Followed at [facility]  . Murmur, cardiac     History   Social History  . Marital Status: Widowed    Spouse Name: N/A  . Number of Children: N/A  . Years of Education: N/A   Occupational History  . Not on file.   Social History Main Topics  . Smoking status: Never Smoker   . Smokeless tobacco: Never Used  . Alcohol Use: No  . Drug Use: No  . Sexual Activity: Not on file   Other Topics Concern  . Not on file   Social History Narrative    Past Surgical History  Procedure Laterality Date  . Abdominal hysterectomy    . Total hip arthroplasty      right  . Spine surgery      steel rod in back    No family history on file.  Allergies  Allergen Reactions  . Ciprofloxacin Hives, Itching and Rash  . Doxycycline Hives, Itching and Rash  . Ferrous Sulfate Hives, Itching and Rash  . Levofloxacin Hives, Itching and Rash  . Penicillins Hives, Itching and Rash  . Sulfa Drugs Cross Reactors Hives, Itching and Rash    Current Outpatient Prescriptions on File Prior to Visit  Medication Sig Dispense Refill  . amLODipine (NORVASC)  5 MG tablet Take 5 mg by mouth daily.    . cyanocobalamin (,VITAMIN B-12,) 1000 MCG/ML injection Inject 1,000 mcg into the muscle every 30 (thirty) days.    . divalproex (DEPAKOTE SPRINKLE) 125 MG capsule Take 2 capsules (250 mg total) by mouth every 12 (twelve) hours. 120 capsule 0  . feeding supplement, ENSURE ENLIVE, (ENSURE ENLIVE) LIQD Take 237 mLs by mouth 2 (two) times daily after a meal. 237 mL 12  . fluticasone (FLONASE) 50 MCG/ACT nasal spray Place 1 spray into both nostrils daily. 16 g 0  . HYDROcodone-acetaminophen (NORCO/VICODIN) 5-325 MG per tablet Take 1 tablet by mouth 3 (three) times daily as needed for moderate pain. 30 tablet 0  . levETIRAcetam (KEPPRA) 1000 MG tablet Take 1 tablet (1,000 mg total) by mouth 2  (two) times daily. 60 tablet 0  . levothyroxine (SYNTHROID, LEVOTHROID) 25 MCG tablet TAKE 1 TABLET (25 MCG TOTAL) BY MOUTH DAILY. 90 tablet 3  . loratadine (CLARITIN) 10 MG tablet Take 1 tablet (10 mg total) by mouth daily.    . Maltodextrin-Xanthan Gum (RESOURCE THICKENUP CLEAR) POWD Use as needed    . metoprolol succinate (TOPROL-XL) 25 MG 24 hr tablet Take 12.5 mg by mouth daily.     . miconazole (MICOTIN) 2 % cream Apply topically 2 (two) times daily. 28.35 g 0  . pravastatin (PRAVACHOL) 40 MG tablet TAKE ONE TABLET EVERY EVENING 90 tablet 1  . pregabalin (LYRICA) 25 MG capsule Take 25 mg by mouth 2 (two) times daily.    Marland Kitchen senna-docusate (SENOKOT-S) 8.6-50 MG per tablet Take 1 tablet by mouth at bedtime as needed for mild constipation.     No current facility-administered medications on file prior to visit.       Objective:   Physical Exam  Constitutional: She is oriented to person, place, and time. She appears well-developed and well-nourished. No distress.  BP 126/62 mmHg  Pulse 68  Temp(Src) 98.4 F (36.9 C) (Oral)  Resp 14  Ht 5\' 2"  (1.575 m)  Wt 112 lb 12.8 oz (51.166 kg)  BMI 20.63 kg/m2  SpO2 98%   HENT:  Head: Normocephalic and atraumatic.  Right Ear: External ear normal.  Left Ear: External ear normal.  Eyes: EOM are normal. Pupils are equal, round, and reactive to light. Right eye exhibits no discharge. Left eye exhibits no discharge. No scleral icterus.  Cardiovascular: Normal rate and regular rhythm.   Pulmonary/Chest: Effort normal and breath sounds normal. No respiratory distress. She has no wheezes. She has no rales. She exhibits no tenderness.  Musculoskeletal: Normal range of motion. She exhibits tenderness. She exhibits no edema.  Tender to palpation left shoulder/hip. PT working with pt  Neurological: She is alert and oriented to person, place, and time. No cranial nerve deficit. She exhibits normal muscle tone. Coordination normal.  Skin: Skin is warm and  dry. No rash noted. She is not diaphoretic.  Psychiatric: She has a normal mood and affect. Her behavior is normal. Judgment and thought content normal.      Assessment & Plan:

## 2014-06-20 NOTE — Progress Notes (Signed)
Pre visit review using our clinic review tool, if applicable. No additional management support is needed unless otherwise documented below in the visit note. 

## 2014-06-21 DIAGNOSIS — G40909 Epilepsy, unspecified, not intractable, without status epilepticus: Secondary | ICD-10-CM | POA: Diagnosis not present

## 2014-06-21 DIAGNOSIS — I69391 Dysphagia following cerebral infarction: Secondary | ICD-10-CM | POA: Diagnosis not present

## 2014-06-21 DIAGNOSIS — I69398 Other sequelae of cerebral infarction: Secondary | ICD-10-CM | POA: Diagnosis not present

## 2014-06-21 DIAGNOSIS — I69320 Aphasia following cerebral infarction: Secondary | ICD-10-CM | POA: Diagnosis not present

## 2014-06-21 DIAGNOSIS — M1990 Unspecified osteoarthritis, unspecified site: Secondary | ICD-10-CM | POA: Diagnosis not present

## 2014-06-21 DIAGNOSIS — R1312 Dysphagia, oropharyngeal phase: Secondary | ICD-10-CM | POA: Diagnosis not present

## 2014-06-22 ENCOUNTER — Telehealth: Payer: Self-pay | Admitting: *Deleted

## 2014-06-22 ENCOUNTER — Telehealth: Payer: Self-pay | Admitting: Urology

## 2014-06-22 DIAGNOSIS — I69391 Dysphagia following cerebral infarction: Secondary | ICD-10-CM | POA: Diagnosis not present

## 2014-06-22 DIAGNOSIS — M1990 Unspecified osteoarthritis, unspecified site: Secondary | ICD-10-CM | POA: Diagnosis not present

## 2014-06-22 DIAGNOSIS — G40909 Epilepsy, unspecified, not intractable, without status epilepticus: Secondary | ICD-10-CM | POA: Diagnosis not present

## 2014-06-22 DIAGNOSIS — I69320 Aphasia following cerebral infarction: Secondary | ICD-10-CM | POA: Diagnosis not present

## 2014-06-22 DIAGNOSIS — R1312 Dysphagia, oropharyngeal phase: Secondary | ICD-10-CM | POA: Diagnosis not present

## 2014-06-22 DIAGNOSIS — I69398 Other sequelae of cerebral infarction: Secondary | ICD-10-CM | POA: Diagnosis not present

## 2014-06-22 NOTE — Telephone Encounter (Signed)
Krista Ellis, pts daughter, called requesting medication be called in for pts UTI.  Further states pt is having burning upon urination, pt was seen by Morey Hummingbird on 5.31.16, no urine collected.  Krista Ellis says pt has frequent UTIs.  Please advise

## 2014-06-22 NOTE — Telephone Encounter (Signed)
We need to see her in a visit. We can probably work her in tomorrow if needed.

## 2014-06-22 NOTE — Telephone Encounter (Signed)
Patient's daughter is calling the office.  Patient is complaining of burning during urination.  Daughter would like to bring a urine sample to the office for culture.  She would like to know if you would call in a prescription for an antibiotic as it is very difficult to get her out of the house.

## 2014-06-22 NOTE — Telephone Encounter (Signed)
Left detailed message on daughters VM to return call to schedule appoint.

## 2014-06-22 NOTE — Telephone Encounter (Signed)
I spoke with pts daughter to schedule appoint for 6.3.16 as per Dr Gilford Rile.  However pts daughter states she has consulted pts Urologist and medication is being prescribed by Urology Office.  No appoint needed at this time.

## 2014-06-22 NOTE — Telephone Encounter (Signed)
Needs to be seen in a visit.

## 2014-06-23 ENCOUNTER — Other Ambulatory Visit: Payer: Self-pay

## 2014-06-23 DIAGNOSIS — G40909 Epilepsy, unspecified, not intractable, without status epilepticus: Secondary | ICD-10-CM | POA: Diagnosis not present

## 2014-06-23 DIAGNOSIS — R1312 Dysphagia, oropharyngeal phase: Secondary | ICD-10-CM | POA: Diagnosis not present

## 2014-06-23 DIAGNOSIS — N390 Urinary tract infection, site not specified: Secondary | ICD-10-CM | POA: Diagnosis not present

## 2014-06-23 DIAGNOSIS — M1990 Unspecified osteoarthritis, unspecified site: Secondary | ICD-10-CM | POA: Diagnosis not present

## 2014-06-23 DIAGNOSIS — I69391 Dysphagia following cerebral infarction: Secondary | ICD-10-CM | POA: Diagnosis not present

## 2014-06-23 DIAGNOSIS — I69320 Aphasia following cerebral infarction: Secondary | ICD-10-CM | POA: Diagnosis not present

## 2014-06-23 DIAGNOSIS — I69398 Other sequelae of cerebral infarction: Secondary | ICD-10-CM | POA: Diagnosis not present

## 2014-06-26 DIAGNOSIS — I69320 Aphasia following cerebral infarction: Secondary | ICD-10-CM | POA: Diagnosis not present

## 2014-06-26 DIAGNOSIS — M1990 Unspecified osteoarthritis, unspecified site: Secondary | ICD-10-CM | POA: Diagnosis not present

## 2014-06-26 DIAGNOSIS — R1312 Dysphagia, oropharyngeal phase: Secondary | ICD-10-CM | POA: Diagnosis not present

## 2014-06-26 DIAGNOSIS — I69398 Other sequelae of cerebral infarction: Secondary | ICD-10-CM | POA: Diagnosis not present

## 2014-06-26 DIAGNOSIS — I69391 Dysphagia following cerebral infarction: Secondary | ICD-10-CM | POA: Diagnosis not present

## 2014-06-26 DIAGNOSIS — G40909 Epilepsy, unspecified, not intractable, without status epilepticus: Secondary | ICD-10-CM | POA: Diagnosis not present

## 2014-06-26 LAB — CULTURE, URINE COMPREHENSIVE

## 2014-06-27 ENCOUNTER — Telehealth: Payer: Self-pay

## 2014-06-27 NOTE — Telephone Encounter (Signed)
Okay, let's bring the patient in for a cath specimen.  This UCx has an organism that is highly resistant.  It is sensitive to Cipro and Levaquin, but she is allergic to that medication. I don't want to start a PICC line and I don't think we can get her into ID very quickly.

## 2014-06-27 NOTE — Telephone Encounter (Signed)
This was not a cath specimen. Pt daughter brought urine sample from home. Cw,lpn

## 2014-06-27 NOTE — Telephone Encounter (Signed)
Unfortunately, the only oral antibiotics that will treat the bacteria are Cipro and Levaquin. She is allergic to both of those medications. The other antibiotics need to be given intravenously and she will need a PICC line placed prior to this or we can refer her to ID. The PICC line and the ID referral may take days to weeks due to the EPIC conversion. I don't know how severe her reactions were to the Cipro and Levaquin the only thing that listed in EMR are hives and itching. I also don't know how recent those allergic reactions were. If the reaction was not very severe and the reaction happened in the remote past, we may be able to try the Cipro or Levaquin.

## 2014-06-27 NOTE — Telephone Encounter (Signed)
-----   Message from Nori Riis, PA-C sent at 06/26/2014  2:25 PM EDT ----- Was this a cath specimen?  If it was, she needs a referral to ID.

## 2014-06-27 NOTE — Telephone Encounter (Signed)
Pt daughter called wanting to know what to do next. Her urine sample was not a cath sample. Daughter stated pt is c/o burning, pain, and very uncomfortable. Please advise. Cw,lpn

## 2014-06-28 ENCOUNTER — Other Ambulatory Visit: Payer: Self-pay | Admitting: Urology

## 2014-06-28 ENCOUNTER — Telehealth: Payer: Self-pay

## 2014-06-28 DIAGNOSIS — N390 Urinary tract infection, site not specified: Secondary | ICD-10-CM

## 2014-06-28 MED ORDER — CIPROFLOXACIN HCL 250 MG PO TABS: 250.0000 mg | ORAL_TABLET | Freq: Two times a day (BID) | ORAL | Status: DC

## 2014-06-28 NOTE — Telephone Encounter (Signed)
Pt daughter called back stating her and the sisters decided they were not comfortable with giving pt cipro. Daughter wanted to have PICC line put in immediately. Explained to daughter for pt to get a PICC line it could take up to 2+ weeks and with current infection that's not the best choice. Nurse made daughter aware to get immediate relief for pt oral abt are best. Daughter voiced understanding. Daughter expressed wanting to take pt to ER. Made daughter aware that would be a decision the family would have to make. Nurse was not medically advising them to take pt to ER.

## 2014-06-28 NOTE — Telephone Encounter (Signed)
Per Larene Beach cipro was called in 250mg  bid x7 days. Spoke with daughters of pt and explained the current situation. All sisters were in agreeance to their knowledge the reaction to cipro was hives and itching. All sisters also agreed to have pt take cipro and to monitor closely. All sisters voiced understanding. Pt will f/u for cath specimen when able to(pt was not able to come into office for cath specimen due to weakness). cw,lpn

## 2014-06-29 DIAGNOSIS — I69398 Other sequelae of cerebral infarction: Secondary | ICD-10-CM | POA: Diagnosis not present

## 2014-06-29 DIAGNOSIS — R1312 Dysphagia, oropharyngeal phase: Secondary | ICD-10-CM | POA: Diagnosis not present

## 2014-06-29 DIAGNOSIS — I69320 Aphasia following cerebral infarction: Secondary | ICD-10-CM | POA: Diagnosis not present

## 2014-06-29 DIAGNOSIS — M1990 Unspecified osteoarthritis, unspecified site: Secondary | ICD-10-CM | POA: Diagnosis not present

## 2014-06-29 DIAGNOSIS — G40909 Epilepsy, unspecified, not intractable, without status epilepticus: Secondary | ICD-10-CM | POA: Diagnosis not present

## 2014-06-29 DIAGNOSIS — I69391 Dysphagia following cerebral infarction: Secondary | ICD-10-CM | POA: Diagnosis not present

## 2014-07-03 ENCOUNTER — Telehealth: Payer: Self-pay | Admitting: Internal Medicine

## 2014-07-03 DIAGNOSIS — M1990 Unspecified osteoarthritis, unspecified site: Secondary | ICD-10-CM | POA: Diagnosis not present

## 2014-07-03 DIAGNOSIS — I69398 Other sequelae of cerebral infarction: Secondary | ICD-10-CM | POA: Diagnosis not present

## 2014-07-03 DIAGNOSIS — I69320 Aphasia following cerebral infarction: Secondary | ICD-10-CM | POA: Diagnosis not present

## 2014-07-03 DIAGNOSIS — I69391 Dysphagia following cerebral infarction: Secondary | ICD-10-CM | POA: Diagnosis not present

## 2014-07-03 DIAGNOSIS — G40909 Epilepsy, unspecified, not intractable, without status epilepticus: Secondary | ICD-10-CM | POA: Diagnosis not present

## 2014-07-03 DIAGNOSIS — R1312 Dysphagia, oropharyngeal phase: Secondary | ICD-10-CM | POA: Diagnosis not present

## 2014-07-03 NOTE — Telephone Encounter (Signed)
Krista Ellis-daughter stopped by to make appt for pt to be evaluated for possible placement at a nurse home. Pt is requiring 24hr care. Please advise where to add pt to the schedule/msn

## 2014-07-04 ENCOUNTER — Encounter: Payer: Self-pay | Admitting: Neurology

## 2014-07-04 ENCOUNTER — Ambulatory Visit (INDEPENDENT_AMBULATORY_CARE_PROVIDER_SITE_OTHER): Payer: Medicare Other | Admitting: Neurology

## 2014-07-04 ENCOUNTER — Other Ambulatory Visit: Payer: Self-pay | Admitting: *Deleted

## 2014-07-04 ENCOUNTER — Encounter: Payer: Self-pay | Admitting: Nurse Practitioner

## 2014-07-04 VITALS — BP 137/70 | HR 62 | Temp 97.0°F | Ht 62.0 in

## 2014-07-04 DIAGNOSIS — Z09 Encounter for follow-up examination after completed treatment for conditions other than malignant neoplasm: Secondary | ICD-10-CM | POA: Insufficient documentation

## 2014-07-04 DIAGNOSIS — I6200 Nontraumatic subdural hemorrhage, unspecified: Secondary | ICD-10-CM

## 2014-07-04 DIAGNOSIS — R569 Unspecified convulsions: Secondary | ICD-10-CM

## 2014-07-04 DIAGNOSIS — S065XAA Traumatic subdural hemorrhage with loss of consciousness status unknown, initial encounter: Secondary | ICD-10-CM

## 2014-07-04 DIAGNOSIS — S065X9A Traumatic subdural hemorrhage with loss of consciousness of unspecified duration, initial encounter: Secondary | ICD-10-CM

## 2014-07-04 MED ORDER — PREGABALIN 50 MG PO CAPS: 50.0000 mg | ORAL_CAPSULE | Freq: Two times a day (BID) | ORAL | Status: DC

## 2014-07-04 MED ORDER — LEVOTHYROXINE SODIUM 25 MCG PO TABS: ORAL_TABLET | ORAL | Status: AC

## 2014-07-04 MED ORDER — PRAVASTATIN SODIUM 40 MG PO TABS: 40.0000 mg | ORAL_TABLET | Freq: Every evening | ORAL | Status: DC

## 2014-07-04 NOTE — Assessment & Plan Note (Signed)
Patient has many specialists on board with her care. She will continue to follow up as needed with the providers for hip/shoulder ([facility]) and Neurology for post subdural hematoma care. She has home health aides (unsure if skilled nursing-assume not from described duties). DME order placed for hospital bed in home to better help take care of patient. Will follow with her PCP in the future.

## 2014-07-04 NOTE — Telephone Encounter (Signed)
Left vm for pt to return my call.  

## 2014-07-04 NOTE — Telephone Encounter (Signed)
Notified pt's daughter that no appt is necessary, advised her to find a home for pt and we will complete required forms.

## 2014-07-04 NOTE — Progress Notes (Signed)
[facility]    Provider:  Dr Krista Ellis Referring Provider: Jackolyn Confer, MD Primary Care Physician:  Krista Mast, MD  CC:  Stroke follow up  HPI:  Krista Ellis is a 81 y.o. female here as a referral from Dr. Gilford Ellis for for follow-up of left facial droop and difficulty expressing herself. Patient was admitted to [facility] in early May. MRI showed frontotemporal right subdural hematoma. Symptoms were thought to be  seizure due to subdural hematoma. She was placed on Keppra. She had a fall and was reaching for the dishwasher and that is the last thing she remembers. She was laying on the floor, she was swelling on the right side where she hit. [facility]. A few days later after she went home, they noticed left facial droop and slurred speech and she was taken back to [place]. She hasn't been on Plavix for years, unclear why she was placed on Plavix. She was placed on Plavix for "mini-strokes" years ago. No history of seizures. She has had 8-9 falls over the last year. She had cervical surgery and impaired ROM in the arms. She lives with a caretaker 24x7 care.   She was placed on Lyrica and hydrocodone for osteoarthritis    Reviewed notes, labs and imaging from outside physicians, which showed:  Ct Head Wo Contrast (personally reviewed images) 05/18/2014  Expected evolutionary change in right-sided subdural hematoma which is 4 mm in thickness with progression to lower density fluid. Atrophy and chronic microvascular ischemia. No superimposed acute abnormality.   Mr Krista Ellis Head Wo Contrast (personally reviewed images) 05/18/2014  ADDENDUM: Possible small amount of subarachnoid hemorrhage deep to the subdural hematoma.  1. Small, subacute right frontotemporal subdural hematoma without mass effect, unchanged from CT earlier today. 2. No acute infarct.  3. Mild chronic small vessel ischemic disease. Chronic infarcts in the  left basal ganglia region and pons.  4. No major intracranial arterial occlusion.  5. Mild-to-moderate distal right vertebral artery stenosis, increased from prior neck CTA.  6. Unchanged, mild mid basilar artery stenosis.  7. Mild stenosis of the proximal right P2 PCA and bilateral cavernous carotids.   CUS - The right ICA Stent showed a mild amount of residual plaque with less than 50% stenosis. The left ICA showed a moderate amount of calcified plaque with 40-59% stenosis, and bilateral vertebral arteries are antegrade.  2D echo - - Left ventricle: The cavity size was normal. Wall thickness wasincreased in a pattern of mild LVH. Systolic function wasvigorous. The estimated ejection fraction was in the range of 65%to 70%. Doppler parameters are consistent with abnormal leftventricular relaxation (grade 1 diastolic dysfunction). - Aortic valve: AV is thickened, calcified. Peak and mean gradientsthrough the valve are 28 and 14 mm Hg respectively consistentwith mild aortic stenosis. Valve area (VTI): 1.43 cm^2. Valvearea (Vmax): 1.31 cm^2. Valve area (Vmean): 1.49 cm^2. - Pulmonary arteries: PA peak pressure: 32 mm Hg (S).  EEG - Impression: This awake and asleep EEG is mildly abnormal due to occasional diffuse delta slowing of the background.  Repeat EEG 05/22/2014 : Abnormal EEG due to generalized slowing indicating a mild to moderate cerebral disturbance (encephalopathy). Focal slowing is intermittently noted over the right fronto-temporal region indicating a focal disturbance and potential seizure foci.   HgbA1c: 5.5 LDL 77   Review of Systems: Patient complains of symptoms per HPI as well as the following symptoms: no CP, no SOB. Pertinent negatives per HPI. All others negative.  History   Social History  . Marital Status: Widowed    Spouse Name: N/A  . Number of Children: N/A  . Years of Education: N/A   Occupational History  . Not on file.   Social History Main  Topics  . Smoking status: Never Smoker   . Smokeless tobacco: Never Used  . Alcohol Use: No  . Drug Use: No  . Sexual Activity: Not on file   Other Topics Concern  . Not on file   Social History Narrative    No family history on file.  Past Medical History  Diagnosis Date  . Hypertension   . Hyperlipidemia   . Arthritis   . Hemihypertrophy   . Myocardial infarction   . Thyroid disease   . CVA (cerebral infarction) 2003  . Peripheral vascular disease     s/p stent right leg  . Pulmonary nodule     stable on Chest CT March 2014, Followed at [facility]  . Murmur, cardiac     Past Surgical History  Procedure Laterality Date  . Abdominal hysterectomy    . Total hip arthroplasty      right  . Spine surgery      steel rod in back    Current Outpatient Prescriptions  Medication Sig Dispense Refill  . amLODipine (NORVASC) 5 MG tablet Take 5 mg by mouth daily.    . ciprofloxacin (CIPRO) 250 MG tablet Take 1 tablet (250 mg total) by mouth 2 (two) times daily. 14 tablet 0  . divalproex (DEPAKOTE SPRINKLE) 125 MG capsule Take 2 capsules (250 mg total) by mouth every 12 (twelve) hours. 120 capsule 0  . feeding supplement, ENSURE ENLIVE, (ENSURE ENLIVE) LIQD Take 237 mLs by mouth 2 (two) times daily after a meal. 237 mL 12  . fluticasone (FLONASE) 50 MCG/ACT nasal spray Place 1 spray into both nostrils daily. 16 g 0  . HYDROcodone-acetaminophen (NORCO/VICODIN) 5-325 MG per tablet Take 1 tablet by mouth 3 (three) times daily as needed for moderate pain. 30 tablet 0  . levETIRAcetam (KEPPRA) 1000 MG tablet Take 1 tablet (1,000 mg total) by mouth 2 (two) times daily. 60 tablet 0  . levothyroxine (SYNTHROID, LEVOTHROID) 25 MCG tablet TAKE 1 TABLET (25 MCG TOTAL) BY MOUTH DAILY. 90 tablet 3  . loratadine (CLARITIN) 10 MG tablet Take 1 tablet (10 mg total) by mouth daily.    . Maltodextrin-Xanthan Gum (RESOURCE THICKENUP CLEAR) POWD Use as needed    . metoprolol succinate (TOPROL-XL) 25 MG  24 hr tablet Take 12.5 mg by mouth daily.     . miconazole (MICOTIN) 2 % cream Apply topically 2 (two) times daily. 28.35 g 0  . pravastatin (PRAVACHOL) 40 MG tablet TAKE ONE TABLET EVERY EVENING 90 tablet 1  . pregabalin (LYRICA) 25 MG capsule Take 25 mg by mouth 2 (two) times daily.    Marland Kitchen senna-docusate (SENOKOT-S) 8.6-50 MG per tablet Take 1 tablet by mouth at bedtime as needed for mild constipation.     No current facility-administered medications for this visit.    Allergies as of 07/04/2014 - Review Complete 06/20/2014  Allergen Reaction Noted  . Ciprofloxacin Hives, Itching, and Rash 10/17/2010  . Doxycycline Hives, Itching, and Rash 12/07/2013  . Ferrous sulfate Hives, Itching, and Rash 01/05/2013  . Levofloxacin Hives, Itching, and Rash 10/17/2010  . Penicillins Hives, Itching, and Rash 10/17/2010  . Sulfa drugs cross reactors Hives, Itching, and Rash 10/17/2010    Vitals: There were no vitals taken for this  visit. Last Weight:  Wt Readings from Last 1 Encounters:  06/20/14 112 lb 12.8 oz (51.166 kg)   Last Height:   Ht Readings from Last 1 Encounters:  06/20/14 5\' 2"  (1.575 m)    Physical exam: Exam: Gen: NAD, conversant, well nourised, obese, well groomed                     CV: RRR, no MRG. No Carotid Bruits. No peripheral edema, warm, nontender Eyes: Conjunctivae clear without exudates or hemorrhage  Neuro: Detailed Neurologic Exam  Speech:    Speech is normal; fluent and spontaneous with normal comprehension.  Cognition:    The patient is oriented to person, place, and time;     recent memory impaired and remote memory intact;     language fluent;     normal attention, concentration,     fund of knowledge impaired Cranial Nerves:    The pupils are equal, round, and reactive to light. The fundi are flat.  Visual fields are full to finger confrontation. Extraocular movements are intact. Trigeminal sensation is intact and the muscles of mastication are  normal. The face is symmetric. The palate elevates in the midline. Hearing intact. Voice is normal. Shoulder shrug is normal. The tongue has normal motion without fasciculations.   Coordination:    No dysmetria noted  Gait:    Can bear weight and walk with assistance, doesn't have walker  Motor Observation:    No asymmetry, no atrophy, and no involuntary movements noted. Tone:    Normal muscle tone.    Posture:    Stooped, kyphosis    Strength: decreased ROM in the deltoids (chronic) otherwise appears intact in the uppers. Bilateral hip flexion 3+/5, quads 5/5, biceps fem 3+/5, DF/PF 5/5    Strength is V/V in the upper and lower limbs.      Sensation: intact to LT     Reflex Exam:  DTR's:    Deep tendon reflexes in the upper and lower extremities are symmetrical bilaterally.   Toes:    The toes are downgoing bilaterally.   Clonus:    Clonus is absent.      Assessment/Plan:  Ms. LATERIA ALDERMAN is a 81 y.o. female with history of a previous CVA, a right subdural hematoma, severe DJD of the neck and lower spine, hypertension, hyperlipidemia, oronary artery disease with previous MI, pulmonary nodule, and peripheral vascular disease presenting with right facial droop and aphasia. She did not receive IV t-PA due to resolution of deficits.   Possible seizure episode due to right SDH and UTI - episodic left facial droop and slurred speech  Resultant back to baseline- No events, will repeat CT and EEG and if both negative will taper off anti epileptics. If blood has ben resorbed, she may not have more episodes. She is having side effects from the AEDs. will restart AEDs if she has further symptoms.  MRI - Small, subacute right frontotemporal SDH and possible small amount of SAH deep to the SDH.  MRA - No major intracranial arterial occlusion.  Carotid Doppler - right ICA stent 50% stenosis, left ICA 40-59%  2D Echo - unremarkable  LDL - 77  Continue Plavix for stroke  prevention - She is on ASA currently, she never restarted plavix. At this point I don't know why she was placed on plavix many years ago. I feel either asa or plavix would be acceptable for stroke prevention. They prefer to stay on ASA for now.  Follow closely with PCP for management of vascular risk factors  Fall prevention  [facility] [address] [place], [place] [address]  Phone [phone] Fax [phone]

## 2014-07-04 NOTE — Patient Instructions (Signed)
Overall you are doing fairly well but I do want to suggest a few things today:   Remember to drink plenty of fluid, eat healthy meals and do not skip any meals. Try to eat protein with a every meal and eat a healthy snack such as fruit or nuts in between meals. Try to keep a regular sleep-wake schedule and try to exercise daily, particularly in the form of walking, 20-30 minutes a day, if you can.   As far as your medications are concerned, I would like to suggest:  Continue Lyrica Stop the Depakote and Keppra: Day one: Keppra in the morning, depakote and keppra in the evening Day two: Keppra in the morning and keppra in the evening Day three - 5: Keppra in the evenings Then stop    As far as diagnostic testing: CT of the head and eeg  I would like to see you back in 6 months, sooner if we need to. Please call us with any interim questions, concerns, problems, updates or refill requests.   Please also call us for any test results so we can go over those with you on the phone.  My clinical assistant and will answer any of your questions and relay your messages to me and also relay most of my messages to you.   Our phone number is [phone]. We also have an after hours call service for urgent matters and there is a physician on-call for urgent questions. For any emergencies you know to call 911 or go to the nearest emergency room

## 2014-07-05 DIAGNOSIS — I69391 Dysphagia following cerebral infarction: Secondary | ICD-10-CM | POA: Diagnosis not present

## 2014-07-05 DIAGNOSIS — I69398 Other sequelae of cerebral infarction: Secondary | ICD-10-CM | POA: Diagnosis not present

## 2014-07-05 DIAGNOSIS — R1312 Dysphagia, oropharyngeal phase: Secondary | ICD-10-CM | POA: Diagnosis not present

## 2014-07-05 DIAGNOSIS — G40909 Epilepsy, unspecified, not intractable, without status epilepticus: Secondary | ICD-10-CM | POA: Diagnosis not present

## 2014-07-05 DIAGNOSIS — M1990 Unspecified osteoarthritis, unspecified site: Secondary | ICD-10-CM | POA: Diagnosis not present

## 2014-07-05 DIAGNOSIS — I69320 Aphasia following cerebral infarction: Secondary | ICD-10-CM | POA: Diagnosis not present

## 2014-07-06 DIAGNOSIS — M1990 Unspecified osteoarthritis, unspecified site: Secondary | ICD-10-CM | POA: Diagnosis not present

## 2014-07-06 DIAGNOSIS — I69320 Aphasia following cerebral infarction: Secondary | ICD-10-CM | POA: Diagnosis not present

## 2014-07-06 DIAGNOSIS — I69391 Dysphagia following cerebral infarction: Secondary | ICD-10-CM | POA: Diagnosis not present

## 2014-07-06 DIAGNOSIS — R1312 Dysphagia, oropharyngeal phase: Secondary | ICD-10-CM | POA: Diagnosis not present

## 2014-07-06 DIAGNOSIS — G40909 Epilepsy, unspecified, not intractable, without status epilepticus: Secondary | ICD-10-CM | POA: Diagnosis not present

## 2014-07-06 DIAGNOSIS — I69398 Other sequelae of cerebral infarction: Secondary | ICD-10-CM | POA: Diagnosis not present

## 2014-07-07 DIAGNOSIS — M1990 Unspecified osteoarthritis, unspecified site: Secondary | ICD-10-CM | POA: Diagnosis not present

## 2014-07-07 DIAGNOSIS — I69398 Other sequelae of cerebral infarction: Secondary | ICD-10-CM | POA: Diagnosis not present

## 2014-07-07 DIAGNOSIS — R1312 Dysphagia, oropharyngeal phase: Secondary | ICD-10-CM | POA: Diagnosis not present

## 2014-07-07 DIAGNOSIS — I69391 Dysphagia following cerebral infarction: Secondary | ICD-10-CM | POA: Diagnosis not present

## 2014-07-07 DIAGNOSIS — G40909 Epilepsy, unspecified, not intractable, without status epilepticus: Secondary | ICD-10-CM | POA: Diagnosis not present

## 2014-07-07 DIAGNOSIS — I69320 Aphasia following cerebral infarction: Secondary | ICD-10-CM | POA: Diagnosis not present

## 2014-07-10 DIAGNOSIS — M1990 Unspecified osteoarthritis, unspecified site: Secondary | ICD-10-CM | POA: Diagnosis not present

## 2014-07-10 DIAGNOSIS — I69398 Other sequelae of cerebral infarction: Secondary | ICD-10-CM | POA: Diagnosis not present

## 2014-07-10 DIAGNOSIS — G40909 Epilepsy, unspecified, not intractable, without status epilepticus: Secondary | ICD-10-CM | POA: Diagnosis not present

## 2014-07-10 DIAGNOSIS — I69391 Dysphagia following cerebral infarction: Secondary | ICD-10-CM | POA: Diagnosis not present

## 2014-07-10 DIAGNOSIS — R1312 Dysphagia, oropharyngeal phase: Secondary | ICD-10-CM | POA: Diagnosis not present

## 2014-07-10 DIAGNOSIS — I69320 Aphasia following cerebral infarction: Secondary | ICD-10-CM | POA: Diagnosis not present

## 2014-07-12 DIAGNOSIS — I69320 Aphasia following cerebral infarction: Secondary | ICD-10-CM | POA: Diagnosis not present

## 2014-07-12 DIAGNOSIS — R1312 Dysphagia, oropharyngeal phase: Secondary | ICD-10-CM | POA: Diagnosis not present

## 2014-07-12 DIAGNOSIS — I69398 Other sequelae of cerebral infarction: Secondary | ICD-10-CM | POA: Diagnosis not present

## 2014-07-12 DIAGNOSIS — M1990 Unspecified osteoarthritis, unspecified site: Secondary | ICD-10-CM | POA: Diagnosis not present

## 2014-07-12 DIAGNOSIS — I69391 Dysphagia following cerebral infarction: Secondary | ICD-10-CM | POA: Diagnosis not present

## 2014-07-12 DIAGNOSIS — G40909 Epilepsy, unspecified, not intractable, without status epilepticus: Secondary | ICD-10-CM | POA: Diagnosis not present

## 2014-07-13 ENCOUNTER — Ambulatory Visit (INDEPENDENT_AMBULATORY_CARE_PROVIDER_SITE_OTHER): Payer: Medicare Other | Admitting: Neurology

## 2014-07-13 DIAGNOSIS — R569 Unspecified convulsions: Secondary | ICD-10-CM | POA: Diagnosis not present

## 2014-07-13 DIAGNOSIS — I69391 Dysphagia following cerebral infarction: Secondary | ICD-10-CM | POA: Diagnosis not present

## 2014-07-13 DIAGNOSIS — M1990 Unspecified osteoarthritis, unspecified site: Secondary | ICD-10-CM | POA: Diagnosis not present

## 2014-07-13 DIAGNOSIS — I69320 Aphasia following cerebral infarction: Secondary | ICD-10-CM | POA: Diagnosis not present

## 2014-07-13 DIAGNOSIS — S065X9A Traumatic subdural hemorrhage with loss of consciousness of unspecified duration, initial encounter: Secondary | ICD-10-CM

## 2014-07-13 DIAGNOSIS — R1312 Dysphagia, oropharyngeal phase: Secondary | ICD-10-CM | POA: Diagnosis not present

## 2014-07-13 DIAGNOSIS — G40909 Epilepsy, unspecified, not intractable, without status epilepticus: Secondary | ICD-10-CM | POA: Diagnosis not present

## 2014-07-13 DIAGNOSIS — S065XAA Traumatic subdural hemorrhage with loss of consciousness status unknown, initial encounter: Secondary | ICD-10-CM

## 2014-07-13 DIAGNOSIS — I69398 Other sequelae of cerebral infarction: Secondary | ICD-10-CM | POA: Diagnosis not present

## 2014-07-13 NOTE — Procedures (Signed)
    History:  Krista Ellis is an 81 year old patient with a history of an episode of left facial droop and difficulty with speech. This occurred in early May. The patient was found to have a right subdural hematoma. The patient is being evaluated for seizures.  This is a routine EEG. No skull defects are noted. Medications include Norvasc, vitamin B12, Depakote, Flonase, hydrocodone, Keppra, Claritin, metoprolol, Synthroid, pravastatin, and Lyrica.   EEG classification: Normal awake  Description of the recording: The background rhythms of this recording consists of a fairly well modulated medium amplitude alpha rhythm of 9 Hz that is reactive to eye opening and closure. As the record progresses, the patient appears to remain in the waking state throughout the recording. Photic stimulation was performed, resulting in a bilateral and symmetric photic driving response. Hyperventilation was not performed. At no time during the recording does there appear to be evidence of spike or spike wave discharges or evidence of focal slowing. EKG monitor shows no evidence of cardiac rhythm abnormalities with a heart rate of 60.  Impression: This is a normal EEG recording in the waking state. No evidence of ictal or interictal discharges are seen.

## 2014-07-14 ENCOUNTER — Telehealth: Payer: Self-pay

## 2014-07-14 NOTE — Telephone Encounter (Signed)
Informed patient of normal EEG, there was not seizures or seizure like activity.  Patient verbalized understanding and requested I call her daughter.

## 2014-07-14 NOTE — Telephone Encounter (Signed)
Spoke with patients daughter to inform her of mothers EEG results.

## 2014-07-17 DIAGNOSIS — I69398 Other sequelae of cerebral infarction: Secondary | ICD-10-CM | POA: Diagnosis not present

## 2014-07-17 DIAGNOSIS — G40909 Epilepsy, unspecified, not intractable, without status epilepticus: Secondary | ICD-10-CM | POA: Diagnosis not present

## 2014-07-17 DIAGNOSIS — I69391 Dysphagia following cerebral infarction: Secondary | ICD-10-CM | POA: Diagnosis not present

## 2014-07-17 DIAGNOSIS — M1990 Unspecified osteoarthritis, unspecified site: Secondary | ICD-10-CM | POA: Diagnosis not present

## 2014-07-17 DIAGNOSIS — R1312 Dysphagia, oropharyngeal phase: Secondary | ICD-10-CM | POA: Diagnosis not present

## 2014-07-17 DIAGNOSIS — I69320 Aphasia following cerebral infarction: Secondary | ICD-10-CM | POA: Diagnosis not present

## 2014-07-19 DIAGNOSIS — M791 Myalgia: Secondary | ICD-10-CM | POA: Diagnosis not present

## 2014-07-19 DIAGNOSIS — M7918 Myalgia, other site: Secondary | ICD-10-CM | POA: Insufficient documentation

## 2014-07-20 DIAGNOSIS — M1990 Unspecified osteoarthritis, unspecified site: Secondary | ICD-10-CM | POA: Diagnosis not present

## 2014-07-20 DIAGNOSIS — I69398 Other sequelae of cerebral infarction: Secondary | ICD-10-CM | POA: Diagnosis not present

## 2014-07-20 DIAGNOSIS — I69320 Aphasia following cerebral infarction: Secondary | ICD-10-CM | POA: Diagnosis not present

## 2014-07-20 DIAGNOSIS — R1312 Dysphagia, oropharyngeal phase: Secondary | ICD-10-CM | POA: Diagnosis not present

## 2014-07-20 DIAGNOSIS — I69391 Dysphagia following cerebral infarction: Secondary | ICD-10-CM | POA: Diagnosis not present

## 2014-07-20 DIAGNOSIS — G40909 Epilepsy, unspecified, not intractable, without status epilepticus: Secondary | ICD-10-CM | POA: Diagnosis not present

## 2014-07-25 DIAGNOSIS — G40909 Epilepsy, unspecified, not intractable, without status epilepticus: Secondary | ICD-10-CM | POA: Diagnosis not present

## 2014-07-25 DIAGNOSIS — I69320 Aphasia following cerebral infarction: Secondary | ICD-10-CM | POA: Diagnosis not present

## 2014-07-25 DIAGNOSIS — R1312 Dysphagia, oropharyngeal phase: Secondary | ICD-10-CM | POA: Diagnosis not present

## 2014-07-25 DIAGNOSIS — I69398 Other sequelae of cerebral infarction: Secondary | ICD-10-CM | POA: Diagnosis not present

## 2014-07-25 DIAGNOSIS — I69391 Dysphagia following cerebral infarction: Secondary | ICD-10-CM | POA: Diagnosis not present

## 2014-07-25 DIAGNOSIS — M1990 Unspecified osteoarthritis, unspecified site: Secondary | ICD-10-CM | POA: Diagnosis not present

## 2014-07-27 DIAGNOSIS — I69320 Aphasia following cerebral infarction: Secondary | ICD-10-CM | POA: Diagnosis not present

## 2014-07-27 DIAGNOSIS — M1990 Unspecified osteoarthritis, unspecified site: Secondary | ICD-10-CM | POA: Diagnosis not present

## 2014-07-27 DIAGNOSIS — I69391 Dysphagia following cerebral infarction: Secondary | ICD-10-CM | POA: Diagnosis not present

## 2014-07-27 DIAGNOSIS — G40909 Epilepsy, unspecified, not intractable, without status epilepticus: Secondary | ICD-10-CM | POA: Diagnosis not present

## 2014-07-27 DIAGNOSIS — R1312 Dysphagia, oropharyngeal phase: Secondary | ICD-10-CM | POA: Diagnosis not present

## 2014-07-27 DIAGNOSIS — I69398 Other sequelae of cerebral infarction: Secondary | ICD-10-CM | POA: Diagnosis not present

## 2014-07-31 ENCOUNTER — Encounter: Payer: Self-pay | Admitting: Internal Medicine

## 2014-07-31 ENCOUNTER — Encounter: Payer: Self-pay | Admitting: *Deleted

## 2014-07-31 DIAGNOSIS — I69398 Other sequelae of cerebral infarction: Secondary | ICD-10-CM | POA: Diagnosis not present

## 2014-07-31 DIAGNOSIS — R1312 Dysphagia, oropharyngeal phase: Secondary | ICD-10-CM | POA: Diagnosis not present

## 2014-07-31 DIAGNOSIS — I69320 Aphasia following cerebral infarction: Secondary | ICD-10-CM | POA: Diagnosis not present

## 2014-07-31 DIAGNOSIS — I69391 Dysphagia following cerebral infarction: Secondary | ICD-10-CM | POA: Diagnosis not present

## 2014-07-31 DIAGNOSIS — G40909 Epilepsy, unspecified, not intractable, without status epilepticus: Secondary | ICD-10-CM | POA: Diagnosis not present

## 2014-07-31 DIAGNOSIS — M1990 Unspecified osteoarthritis, unspecified site: Secondary | ICD-10-CM | POA: Diagnosis not present

## 2014-08-01 ENCOUNTER — Ambulatory Visit: Payer: Self-pay | Admitting: Urology

## 2014-08-04 DIAGNOSIS — I69320 Aphasia following cerebral infarction: Secondary | ICD-10-CM | POA: Diagnosis not present

## 2014-08-04 DIAGNOSIS — R1312 Dysphagia, oropharyngeal phase: Secondary | ICD-10-CM | POA: Diagnosis not present

## 2014-08-04 DIAGNOSIS — M1990 Unspecified osteoarthritis, unspecified site: Secondary | ICD-10-CM | POA: Diagnosis not present

## 2014-08-04 DIAGNOSIS — I69398 Other sequelae of cerebral infarction: Secondary | ICD-10-CM | POA: Diagnosis not present

## 2014-08-04 DIAGNOSIS — I69391 Dysphagia following cerebral infarction: Secondary | ICD-10-CM | POA: Diagnosis not present

## 2014-08-04 DIAGNOSIS — G40909 Epilepsy, unspecified, not intractable, without status epilepticus: Secondary | ICD-10-CM | POA: Diagnosis not present

## 2014-08-07 ENCOUNTER — Ambulatory Visit: Payer: Self-pay | Admitting: Urology

## 2014-08-09 DIAGNOSIS — I69391 Dysphagia following cerebral infarction: Secondary | ICD-10-CM | POA: Diagnosis not present

## 2014-08-09 DIAGNOSIS — I69320 Aphasia following cerebral infarction: Secondary | ICD-10-CM | POA: Diagnosis not present

## 2014-08-09 DIAGNOSIS — G40909 Epilepsy, unspecified, not intractable, without status epilepticus: Secondary | ICD-10-CM | POA: Diagnosis not present

## 2014-08-09 DIAGNOSIS — M1990 Unspecified osteoarthritis, unspecified site: Secondary | ICD-10-CM | POA: Diagnosis not present

## 2014-08-09 DIAGNOSIS — I69398 Other sequelae of cerebral infarction: Secondary | ICD-10-CM | POA: Diagnosis not present

## 2014-08-09 DIAGNOSIS — R1312 Dysphagia, oropharyngeal phase: Secondary | ICD-10-CM | POA: Diagnosis not present

## 2014-08-11 ENCOUNTER — Ambulatory Visit (INDEPENDENT_AMBULATORY_CARE_PROVIDER_SITE_OTHER): Payer: Medicare Other | Admitting: Internal Medicine

## 2014-08-11 ENCOUNTER — Encounter: Payer: Self-pay | Admitting: Internal Medicine

## 2014-08-11 VITALS — BP 123/64 | HR 68 | Temp 97.9°F | Ht 62.0 in | Wt 116.5 lb

## 2014-08-11 DIAGNOSIS — E039 Hypothyroidism, unspecified: Secondary | ICD-10-CM

## 2014-08-11 DIAGNOSIS — E538 Deficiency of other specified B group vitamins: Secondary | ICD-10-CM | POA: Diagnosis not present

## 2014-08-11 DIAGNOSIS — S81802A Unspecified open wound, left lower leg, initial encounter: Secondary | ICD-10-CM

## 2014-08-11 DIAGNOSIS — M1990 Unspecified osteoarthritis, unspecified site: Secondary | ICD-10-CM | POA: Diagnosis not present

## 2014-08-11 DIAGNOSIS — S81002A Unspecified open wound, left knee, initial encounter: Secondary | ICD-10-CM

## 2014-08-11 DIAGNOSIS — I69391 Dysphagia following cerebral infarction: Secondary | ICD-10-CM | POA: Diagnosis not present

## 2014-08-11 DIAGNOSIS — S91009A Unspecified open wound, unspecified ankle, initial encounter: Secondary | ICD-10-CM

## 2014-08-11 DIAGNOSIS — I10 Essential (primary) hypertension: Secondary | ICD-10-CM | POA: Diagnosis not present

## 2014-08-11 DIAGNOSIS — G40909 Epilepsy, unspecified, not intractable, without status epilepticus: Secondary | ICD-10-CM | POA: Diagnosis not present

## 2014-08-11 DIAGNOSIS — R296 Repeated falls: Secondary | ICD-10-CM | POA: Diagnosis not present

## 2014-08-11 DIAGNOSIS — G458 Other transient cerebral ischemic attacks and related syndromes: Secondary | ICD-10-CM | POA: Diagnosis not present

## 2014-08-11 DIAGNOSIS — R1312 Dysphagia, oropharyngeal phase: Secondary | ICD-10-CM | POA: Diagnosis not present

## 2014-08-11 DIAGNOSIS — S91002A Unspecified open wound, left ankle, initial encounter: Secondary | ICD-10-CM

## 2014-08-11 DIAGNOSIS — Z8679 Personal history of other diseases of the circulatory system: Secondary | ICD-10-CM | POA: Diagnosis not present

## 2014-08-11 DIAGNOSIS — S81809A Unspecified open wound, unspecified lower leg, initial encounter: Secondary | ICD-10-CM

## 2014-08-11 DIAGNOSIS — I69320 Aphasia following cerebral infarction: Secondary | ICD-10-CM | POA: Diagnosis not present

## 2014-08-11 DIAGNOSIS — S81009A Unspecified open wound, unspecified knee, initial encounter: Secondary | ICD-10-CM | POA: Insufficient documentation

## 2014-08-11 DIAGNOSIS — I69398 Other sequelae of cerebral infarction: Secondary | ICD-10-CM | POA: Diagnosis not present

## 2014-08-11 LAB — COMPREHENSIVE METABOLIC PANEL
ALT: 8 U/L (ref 0–35)
AST: 17 U/L (ref 0–37)
Albumin: 3.8 g/dL (ref 3.5–5.2)
Alkaline Phosphatase: 53 U/L (ref 39–117)
BUN: 23 mg/dL (ref 6–23)
CHLORIDE: 101 meq/L (ref 96–112)
CO2: 29 mEq/L (ref 19–32)
CREATININE: 0.68 mg/dL (ref 0.40–1.20)
Calcium: 9.7 mg/dL (ref 8.4–10.5)
GFR: 88 mL/min (ref >60.00)
Glucose, Bld: 108 mg/dL — ABNORMAL HIGH (ref 70–99)
Potassium: 4.6 mEq/L (ref 3.5–5.1)
Sodium: 136 mEq/L (ref 135–145)
Total Bilirubin: 0.5 mg/dL (ref 0.2–1.2)
Total Protein: 6.5 g/dL (ref 6.0–8.3)

## 2014-08-11 LAB — LIPID PANEL
CHOL/HDL RATIO: 4
Cholesterol: 165 mg/dL (ref 0–200)
HDL: 43.3 mg/dL (ref >39.00)
LDL Cholesterol: 98 mg/dL (ref 0–99)
NonHDL: 121.7
TRIGLYCERIDES: 120 mg/dL (ref 0.0–149.0)
VLDL: 24 mg/dL (ref 0.0–40.0)

## 2014-08-11 LAB — CBC WITH DIFFERENTIAL/PLATELET
BASOS PCT: 0.4 % (ref 0.0–3.0)
Basophils Absolute: 0 10*3/uL (ref 0.0–0.1)
Eosinophils Absolute: 0.1 10*3/uL (ref 0.0–0.7)
Eosinophils Relative: 1.5 % (ref 0.0–5.0)
HEMATOCRIT: 42.1 % (ref 36.0–46.0)
Hemoglobin: 14 g/dL (ref 12.0–15.0)
LYMPHS ABS: 2.3 10*3/uL (ref 0.7–4.0)
Lymphocytes Relative: 42.6 % (ref 12.0–46.0)
MCHC: 33.2 g/dL (ref 30.0–36.0)
MCV: 93.3 fl (ref 78.0–100.0)
Monocytes Absolute: 0.4 10*3/uL (ref 0.1–1.0)
Monocytes Relative: 7.6 % (ref 3.0–12.0)
NEUTROS ABS: 2.6 10*3/uL (ref 1.4–7.7)
Neutrophils Relative %: 47.9 % (ref 43.0–77.0)
Platelets: 209 10*3/uL (ref 150.0–400.0)
RBC: 4.51 Mil/uL (ref 3.87–5.11)
RDW: 13.5 % (ref 11.5–15.5)
WBC: 5.3 10*3/uL (ref 4.0–10.5)

## 2014-08-11 LAB — TSH: TSH: 3.99 u[IU]/mL (ref 0.35–4.50)

## 2014-08-11 MED ORDER — CYANOCOBALAMIN 1000 MCG/ML IJ SOLN
1000.0000 ug | Freq: Once | INTRAMUSCULAR | Status: AC
  Administered 2014-08-11: 1000 ug via INTRAMUSCULAR

## 2014-08-11 MED ORDER — GENTAMICIN SULFATE 0.1 % EX OINT: 1.0000 "application " | TOPICAL_OINTMENT | Freq: Three times a day (TID) | CUTANEOUS | Status: DC

## 2014-08-11 NOTE — Assessment & Plan Note (Signed)
Recurrent falls at home. Recommend 24/7 supervision to help prevent falls. Continue home physical therapy and occupational therapy.

## 2014-08-11 NOTE — Assessment & Plan Note (Signed)
Recent TIA followed by neurology. Reviewed notes today. Family reports that both aspirin and plavix were stopped after last , however notes recommend either plavix or statin for stroke prevention. Follow up with neurology as scheduled. Will continue statin.

## 2014-08-11 NOTE — Assessment & Plan Note (Signed)
Continue 24/7 care for falls prevention.

## 2014-08-11 NOTE — Patient Instructions (Addendum)
Start topical Gentamicin 2-3 times daily to leg wound.  Labs today.  Follow up in 3 months.

## 2014-08-11 NOTE — Assessment & Plan Note (Signed)
Wound left anterior lower leg with mild erythema. Will start topical gentamicin. Follow up if wound is not improving.

## 2014-08-11 NOTE — Progress Notes (Signed)
Pre visit review using our clinic review tool, if applicable. No additional management support is needed unless otherwise documented below in the visit note. 

## 2014-08-11 NOTE — Progress Notes (Addendum)
Subjective:    Patient ID: Krista Ellis, female    DOB: [birth year], 81 y.o.   MRN: [id number]  HPI  82YO female presents for follow up.  Last seen in 04/2014. Hospitalized in 04/2014-05/2014 for TIA, seizures and fall resulting in SDH Stayed at [facility] for 2.5 weeks, then had assistance with home health.  Both physical therapist and speech therapist are coming to her home twice weekly. Family is paying an agency to care for her 24/7. No recent falls.  She is concerned about a skin lesion on her left anterior lower leg. Unsure how long this has been present. Not painful or itchy. Not using anything for this.  Past medical, surgical, family and social history per today's encounter.  Review of Systems  Constitutional: Negative for fever, chills, appetite change, fatigue and unexpected weight change.  Eyes: Negative for visual disturbance.  Respiratory: Negative for shortness of breath.   Cardiovascular: Negative for chest pain and leg swelling.  Gastrointestinal: Negative for nausea, vomiting, abdominal pain, diarrhea and constipation.  Musculoskeletal: Negative for myalgias and arthralgias.  Skin: Negative for color change and rash.  Hematological: Negative for adenopathy. Does not bruise/bleed easily.  Psychiatric/Behavioral: Negative for dysphoric mood. The patient is not nervous/anxious.        Objective:    BP 123/64 mmHg  Pulse 68  Temp(Src) 97.9 F (36.6 C) (Oral)  Ht 5\' 2"  (1.575 m)  Wt 116 lb 8 oz (52.844 kg)  BMI 21.30 kg/m2  SpO2 96% Physical Exam  Constitutional: She is oriented to person, place, and time. She appears well-developed and well-nourished. No distress.  HENT:  Head: Normocephalic and atraumatic.  Right Ear: External ear normal.  Left Ear: External ear normal.  Nose: Nose normal.  Mouth/Throat: Oropharynx is clear and moist. No oropharyngeal exudate.  Eyes: Conjunctivae are normal. Pupils are equal, round, and reactive to light.  Right eye exhibits no discharge. Left eye exhibits no discharge. No scleral icterus.  Neck: Normal range of motion. Neck supple. No tracheal deviation present. No thyromegaly present.  Cardiovascular: Normal rate, regular rhythm, normal heart sounds and intact distal pulses.  Exam reveals no gallop and no friction rub.   No murmur heard. Pulmonary/Chest: Effort normal and breath sounds normal. No respiratory distress. She has no wheezes. She has no rales. She exhibits no tenderness.  Musculoskeletal: Normal range of motion. She exhibits no edema or tenderness.  Lymphadenopathy:    She has no cervical adenopathy.  Neurological: She is alert and oriented to person, place, and time. No cranial nerve deficit. She exhibits normal muscle tone. Coordination normal.  Skin: Skin is warm and dry. Lesion noted. No rash noted. She is not diaphoretic. No erythema. No pallor.     Psychiatric: She has a normal mood and affect. Her behavior is normal. Judgment and thought content normal.          Assessment & Plan:   Problem List Items Addressed This Visit      Unprioritized   B12 deficiency   Relevant Medications   cyanocobalamin ((VITAMIN B-12)) injection 1,000 mcg (Completed)   History of subdural hematoma    Continue 24/7 care for falls prevention.      Relevant Orders   CBC with Differential/Platelet   Hypertension    BP Readings from Last 3 Encounters:  08/11/14 123/64  07/04/14 137/70  06/20/14 126/62   BP well controlled. Renal function with labs today.      Relevant Orders   Comprehensive metabolic  panel   Lipid panel   Hypothyroidism    Will check thyroid function with labs.      Relevant Orders   TSH   Open wound of knee, leg (except thigh), and ankle, complicated    Wound left anterior lower leg with mild erythema. Will start topical gentamicin. Follow up if wound is not improving.      Relevant Medications   gentamicin ointment (GARAMYCIN) 0.1 %   Recurrent falls  - Primary    Recurrent falls at home. Recommend 24/7 supervision to help prevent falls. Continue home physical therapy and occupational therapy.      TIA (transient ischemic attack)    Recent TIA followed by neurology. Reviewed notes today. Family reports that both aspirin and plavix were stopped after last , however notes recommend either plavix or statin for stroke prevention. Follow up with neurology as scheduled. Will continue statin.          Return in about 3 months (around 11/11/2014) for Recheck.

## 2014-08-11 NOTE — Assessment & Plan Note (Signed)
Will check thyroid function with labs.

## 2014-08-11 NOTE — Assessment & Plan Note (Signed)
BP Readings from Last 3 Encounters:  08/11/14 123/64  07/04/14 137/70  06/20/14 126/62   BP well controlled. Renal function with labs today.

## 2014-08-14 ENCOUNTER — Encounter: Payer: Self-pay | Admitting: *Deleted

## 2014-08-18 ENCOUNTER — Emergency Department: Payer: Medicare Other

## 2014-08-18 ENCOUNTER — Emergency Department
Admission: EM | Admit: 2014-08-18 | Discharge: 2014-08-18 | Disposition: A | Discharge status: Home / Self Care | Payer: Medicare Other | Attending: Emergency Medicine | Admitting: Emergency Medicine

## 2014-08-18 DIAGNOSIS — Y9289 Other specified places as the place of occurrence of the external cause: Secondary | ICD-10-CM | POA: Insufficient documentation

## 2014-08-18 DIAGNOSIS — S79911A Unspecified injury of right hip, initial encounter: Secondary | ICD-10-CM | POA: Diagnosis present

## 2014-08-18 DIAGNOSIS — W19XXXA Unspecified fall, initial encounter: Secondary | ICD-10-CM | POA: Diagnosis not present

## 2014-08-18 DIAGNOSIS — Y998 Other external cause status: Secondary | ICD-10-CM | POA: Diagnosis not present

## 2014-08-18 DIAGNOSIS — S32591A Other specified fracture of right pubis, initial encounter for closed fracture: Secondary | ICD-10-CM | POA: Diagnosis not present

## 2014-08-18 DIAGNOSIS — S0990XA Unspecified injury of head, initial encounter: Secondary | ICD-10-CM | POA: Diagnosis not present

## 2014-08-18 DIAGNOSIS — I10 Essential (primary) hypertension: Secondary | ICD-10-CM | POA: Insufficient documentation

## 2014-08-18 DIAGNOSIS — W01198A Fall on same level from slipping, tripping and stumbling with subsequent striking against other object, initial encounter: Secondary | ICD-10-CM | POA: Insufficient documentation

## 2014-08-18 DIAGNOSIS — Z79899 Other long term (current) drug therapy: Secondary | ICD-10-CM | POA: Diagnosis not present

## 2014-08-18 DIAGNOSIS — M25559 Pain in unspecified hip: Secondary | ICD-10-CM | POA: Diagnosis not present

## 2014-08-18 DIAGNOSIS — Y9389 Activity, other specified: Secondary | ICD-10-CM | POA: Diagnosis not present

## 2014-08-18 DIAGNOSIS — Z96641 Presence of right artificial hip joint: Secondary | ICD-10-CM | POA: Diagnosis not present

## 2014-08-18 DIAGNOSIS — S32501A Unspecified fracture of right pubis, initial encounter for closed fracture: Secondary | ICD-10-CM | POA: Diagnosis not present

## 2014-08-18 DIAGNOSIS — Z880 Allergy status to penicillin: Secondary | ICD-10-CM | POA: Diagnosis not present

## 2014-08-18 MED ORDER — OXYCODONE-ACETAMINOPHEN 5-325 MG PO TABS
1.0000 | ORAL_TABLET | Freq: Once | ORAL | Status: AC
  Administered 2014-08-18: 1 via ORAL
  Filled 2014-08-18: qty 1

## 2014-08-18 MED ORDER — OXYCODONE-ACETAMINOPHEN 5-325 MG PO TABS: 1.0000 | ORAL_TABLET | Freq: Four times a day (QID) | ORAL | Status: DC | PRN

## 2014-08-18 NOTE — Discharge Instructions (Signed)
Please follow-up with orthopedics in 7-10 days, if you continue to have pain. Return to the emergency department for any worsening pain, or if you are unable to walk due to pain. Take your pain medication as needed, as prescribed. As we discussed do not take this pain medication with hydrocodone, take one or the other.   Stable Pelvic Fracture You have one or more fractures (this means there is a break in the bones) of the pelvis. The pelvis is the ring of bones that make up your hipbones. These are the bones you sit on and the lower part of the spine. It is like a boney ring where your legs attach and which supports your upper body. You have an undisplaced fracture. This means the bones are in good position. The pelvic fracture you have is a simple (uncomplicated) fracture. DIAGNOSIS  X-rays usually diagnose these fractures. TREATMENT  The goal of treating pelvic fractures is to get the bones to heal in a good position. The patient should return to normal activities as soon as possible. Such fractures are often treated with normal bed rest and conservative measures.  HOME CARE INSTRUCTIONS   You should be on bed rest for as long as directed by your caregiver. Change positions of your legs every 1-2 hours to maintain good blood flow. You may sit as long as is tolerable. Following this, you may do usual activities, but avoid strenuous activities for as long as directed by your caregiver.  Only take over-the-counter or prescription medicines for pain, discomfort, or fever as directed by your caregiver.  Bed rest may also be used for discomfort.  Resume your activities when you are able. Use a cane or crutch on the injured side to reduce pain while walking, as needed.  If you develop increased pain or discomfort not relieved with medications, contact your caregiver.  Warning: Do not drive a car or operate a motor vehicle until your caregiver specifically tells you it is safe to do so. SEEK  IMMEDIATE MEDICAL CARE IF:   You feel light-headed or faint, develop chest pain or shortness of breath.  An unexplained oral temperature above 102 F (38.9 C) develops.  You develop blood in the urine or in the stools.  There is difficulty urinating, and/or having a bowel movement, or pain with these efforts.  There is a difficulty or increased pain with walking.  There is swelling in one or both legs that is not normal. Document Released: 03/17/2001 Document Revised: 05/23/2013 Document Reviewed: 08/20/2007 [facility] Patient Information 2015 [place], [place]. This information is not intended to replace advice given to you by your health care provider. Make sure you discuss any questions you have with your health care provider.

## 2014-08-18 NOTE — ED Notes (Signed)
MD at bedside. 

## 2014-08-18 NOTE — ED Notes (Signed)
Pt here from home with c/o weakness, reports standing at refrigerator and fell backwards. Pt unable to bear weight to right leg, EMS reports leg shortening to right leg is normal for her. Pt reports hematoma to right side of head, denies LOC. Pt reports right hip pain.

## 2014-08-18 NOTE — ED Notes (Signed)
Pt needed 2+ assist to stand and pivot to [facility]. Dr Darlyn Chamber

## 2014-08-18 NOTE — ED Provider Notes (Signed)
[facility] Emergency Department Provider Note  Time seen: 3:00 PM  I have reviewed the triage vital signs and the nursing notes.   HISTORY  Chief Complaint Fall and Hip Pain    Krista Ellis is a 81 y.o. female with a past medical history of hypertension, hyperlipidemia, arthritis, CVA, right hip replacement, presents the emergency department after a fall. According to the patient she was at the [place] later, when she fell backwards hitting her head and landing on her right hip. Patient states pain upon standing in the right hip, and a knot on her head. Denies loss of consciousness. Patient is not currently taking any blood thinners. Denies headache at this time. Patient denies any pain in the right hip, but states it only hurts if she tries to stand up on it. Normal sensation per patient. No other injuries per patient. Describes her right hip pain as 0 currently, but moderate upon standing.     Past Medical History  Diagnosis Date  . Hypertension   . Hyperlipidemia   . Arthritis   . Hemihypertrophy   . Myocardial infarction   . Thyroid disease   . CVA (cerebral infarction) 2003  . Peripheral vascular disease     s/p stent right leg  . Pulmonary nodule     stable on Chest CT March 2014, Followed at [facility]  . Murmur, cardiac   . Chronic cystitis   . Hematuria   . Bleeding disorder   . Recurrent UTI   . Gross hematuria   . Urinary frequency   . Dysuria   . Sensory urge incontinence   . History of kidney stones     Patient Active Problem List   Diagnosis Date Noted  . Open wound of knee, leg (except thigh), and ankle, complicated 89/21/1939  . Hospital discharge follow-up 07/04/2014  . Facial droop   . Malnutrition of moderate degree 05/21/2014  . Seizures   . TIA (transient ischemic attack) 05/18/2014  . Systolic murmur 74/08/1446  . Gait disturbance 05/15/2014  . History of subdural hematoma   . Subdural hematoma 05/05/2014  . B12  deficiency 03/09/2014  . Recurrent UTI 01/03/2014  . Bilateral hand numbness 01/03/2014  . Nephrolithiasis 11/18/2013  . Recurrent falls 11/18/2013  . Abnormal CT scan, chest 09/01/2013  . Chest wall pain 08/31/2013  . Hematoma and contusion 08/31/2013  . Atherosclerotic peripheral vascular disease 06/01/2013  . Bladder wall hemorrhage 12/10/2012  . Postmenopausal estrogen deficiency 11/30/2012  . Medicare annual wellness visit, subsequent 10/01/2011  . Dyslipidemia 10/01/2011  . Hypertension 10/17/2010  . Hypothyroidism 10/17/2010  . Osteoarthritis 10/17/2010    Past Surgical History  Procedure Laterality Date  . Abdominal hysterectomy  1982  . Total hip arthroplasty      right  . Spine surgery      steel rod in back  . Tonsillectomy    . Left rotator cuff      repair  . Total knee arthroplasty Left   . Hip surgery  2007    replaced ball in right hip  . Cardiac surgery      2 failed stents in right leg  . Stent placement leg Right 2015  . Carpal tunnel release    . Appendectomy    . Ruptured disc      Current Outpatient Rx  Name  Route  Sig  Dispense  Refill  . amLODipine (NORVASC) 5 MG tablet   Oral   Take 5 mg by  mouth daily.         . cyanocobalamin (,VITAMIN B-12,) 1000 MCG/ML injection   Intramuscular   Inject 1,000 mcg into the muscle once.         . DiphenhydrAMINE HCl (BENADRYL ALLERGY CHILDRENS PO)   Oral   Take by mouth.         . feeding supplement, ENSURE ENLIVE, (ENSURE ENLIVE) LIQD   Oral   Take 237 mLs by mouth 2 (two) times daily after a meal.   237 mL   12   . gentamicin ointment (GARAMYCIN) 0.1 %   Topical   Apply 1 application topically 3 (three) times daily.   15 g   0   . HYDROcodone-acetaminophen (NORCO/VICODIN) 5-325 MG per tablet   Oral   Take 1 tablet by mouth 3 (three) times daily as needed for moderate pain.   30 tablet   0   . levothyroxine (SYNTHROID, LEVOTHROID) 25 MCG tablet      TAKE 1 TABLET (25 MCG  TOTAL) BY MOUTH DAILY.   90 tablet   1   . loratadine (CLARITIN) 10 MG tablet   Oral   Take 1 tablet (10 mg total) by mouth daily.         . metoprolol succinate (TOPROL-XL) 25 MG 24 hr tablet   Oral   Take 12.5 mg by mouth daily.          . pravastatin (PRAVACHOL) 40 MG tablet   Oral   Take 1 tablet (40 mg total) by mouth every evening.   90 tablet   1   . pregabalin (LYRICA) 50 MG capsule   Oral   Take 1 capsule (50 mg total) by mouth 2 (two) times daily.   60 capsule   6     Allergies Ciprofloxacin; Doxycycline; Ferrous sulfate; Levofloxacin; Penicillins; and Sulfa drugs cross reactors  Family History  Problem Relation Age of Onset  . Hypertension Mother   . Transient ischemic attack Mother   . Kidney disease Neg Hx   . Bladder Cancer Neg Hx   . Prostate cancer Neg Hx     Social History History  Substance Use Topics  . Smoking status: Never Smoker   . Smokeless tobacco: Never Used  . Alcohol Use: No    Review of Systems Constitutional: Negative for fever. Negative for loss of consciousness. Cardiovascular: Negative for chest pain. Respiratory: Negative for shortness of breath. Gastrointestinal: Negative for abdominal pain Musculoskeletal: Positive for right hip pain. Neurological: Negative for . Negative for focal weakness or numbness. 10-point ROS otherwise negative.  ____________________________________________   PHYSICAL EXAM:  VITAL SIGNS: ED Triage Vitals  Enc Vitals Group     BP 08/18/14 1451 146/42 mmHg     Pulse Rate 08/18/14 1451 68     Resp 08/18/14 1451 19     Temp 08/18/14 1451 98.6 F (37 C)     Temp Source 08/18/14 1451 Oral     SpO2 08/18/14 1451 97 %     Weight 08/18/14 1451 115 lb (52.164 kg)     Height 08/18/14 1451 5\' 2"  (1.575 m)     Head Cir --      Peak Flow --      Pain Score 08/18/14 1452 6     Pain Loc --      Pain Edu? --      Excl. in [place]? --     Constitutional: Alert and oriented. Well appearing and in  no distress. Eyes:  Normal exam ENT   Head: Patient with mild tenderness palpation over the right parietal area, small hematoma noted. Cardiovascular: Normal rate, regular rhythm. 2/6 systolic murmur noted. Respiratory: Normal respiratory effort without tachypnea nor retractions. Breath sounds are clear Gastrointestinal: Soft and nontender. No distention.  Musculoskeletal: No neck or back tenderness to palpation. Mild right lateral hip tenderness to palpation. Pelvis stable. Good range of motion in the hip without pain. Warm extremity, neurovascularly intact. Neurologic:  Normal speech and language. No gross focal neurologic deficits Skin:  Skin is warm, dry and intact.  Psychiatric: Mood and affect are normal. Speech and behavior are normal.   ____________________________________________   RADIOLOGY  CT head shows no acute abnormality. Hip x-ray consistent with right pubic rami oh (inferior) fracture  ____________________________________________    INITIAL IMPRESSION / ASSESSMENT AND PLAN / ED COURSE  Pertinent labs & imaging results that were available during my care of the patient were reviewed by me and considered in my medical decision making (see chart for details).  Patient with a mechanical fall suffering a head injury as well as right hip pain upon standing. We will x-ray the right hip, CT the head, and monitor closely in the emergency department.  X-ray consistent with pubic grandmother fracture. We will treat with pain medication. The patient takes hydrocodone for pain at home. We will replace this with oxycodone, and Tylenol as needed. Patient and daughter are agreeable to this plan. We will attempt to ambulate the patient in the emergency department with a walker to ensure she can ambulate. Patient uses a 4 point walker at home at all times.  Patient able to pivot, and ambulate somewhat with walker. Patient has a 24 7 in home health aide, as well as a wheelchair at  home. They feel confident they can appropriately manage the patient at home. The health aide states even today after the fall she was able to get the patient up, irritable wheelchair, and get her to the bathroom without much difficulty. I discussed with the patient and her family that if it becomes too difficult/painful, or safety concerns they're to return to the department, and they're agreeable to plan. ____________________________________________   FINAL CLINICAL IMPRESSION(S) / ED DIAGNOSES  Fall Right hip pain Right inferior pubic ramus fracture   Harvest Dark, MD 08/18/14 1729

## 2014-08-23 ENCOUNTER — Telehealth: Payer: Self-pay | Admitting: *Deleted

## 2014-08-23 ENCOUNTER — Other Ambulatory Visit: Payer: Self-pay | Admitting: Internal Medicine

## 2014-08-23 DIAGNOSIS — S329XXA Fracture of unspecified parts of lumbosacral spine and pelvis, initial encounter for closed fracture: Secondary | ICD-10-CM | POA: Diagnosis not present

## 2014-08-23 DIAGNOSIS — R2681 Unsteadiness on feet: Secondary | ICD-10-CM

## 2014-08-23 NOTE — Telephone Encounter (Signed)
Can you look into this?

## 2014-08-23 NOTE — Telephone Encounter (Signed)
pts daughter [name] called requesting status of home PT referral.  Please advise

## 2014-08-24 ENCOUNTER — Ambulatory Visit: Payer: Medicare Other | Admitting: Neurology

## 2014-08-30 ENCOUNTER — Telehealth: Payer: Self-pay | Admitting: Internal Medicine

## 2014-08-30 DIAGNOSIS — S329XXD Fracture of unspecified parts of lumbosacral spine and pelvis, subsequent encounter for fracture with routine healing: Secondary | ICD-10-CM | POA: Diagnosis not present

## 2014-08-30 DIAGNOSIS — M25551 Pain in right hip: Secondary | ICD-10-CM | POA: Diagnosis not present

## 2014-08-30 NOTE — Telephone Encounter (Signed)
Krista Ellis called regarding her moms FL2 form. The form was hand delivered on 08/10/2014 by [employer] to the office. Pt last appt at office was 08/11/2014. Daughter can be reached [phone] Krista Ellis (daughter). Daughter states it is Urgent because they need to get Ms Locascio in a nursing facility.

## 2014-08-30 NOTE — Telephone Encounter (Signed)
We received notes from her attorney, but I do not recall seeing an FL2 form. Typically, this would come from a facility. When I spoke with her and her daughter at the visit, they were not ready for her to move to a facility? We can ask julie when she returns. She may know about it.

## 2014-09-01 ENCOUNTER — Other Ambulatory Visit: Payer: Medicare Other

## 2014-09-01 ENCOUNTER — Telehealth: Payer: Self-pay | Admitting: Urology

## 2014-09-01 DIAGNOSIS — R300 Dysuria: Secondary | ICD-10-CM

## 2014-09-01 NOTE — Telephone Encounter (Signed)
Notified daughter that a generic FL2 form was here completed as much as possible based on current medications and patients condition, waiting for notification of the selected nursing home that pt will be admitted to.

## 2014-09-01 NOTE — Telephone Encounter (Signed)
Pt daughter dropped off a urine complaining that the urine had a strong odor and she notice just a little bit of confusion. Pt doesn't report any fever or chills or any significant urinary symptoms at this time. I informed the daughter if the symptoms worsen to give [place] a call back and also that we will notify her as soon as the urine culture results return.

## 2014-09-02 LAB — URINALYSIS, COMPLETE
Glucose, UA: NEGATIVE
KETONES UA: NEGATIVE
Nitrite, UA: POSITIVE — AB
RBC, UA: NEGATIVE
SPEC GRAV UA: 1.02 (ref 1.005–1.030)
Urobilinogen, Ur: 0.2 mg/dL (ref 0.2–1.0)
pH, UA: 7.5 (ref 5.0–7.5)

## 2014-09-02 LAB — MICROSCOPIC EXAMINATION
Epithelial Cells (non renal): NONE SEEN /hpf (ref 0–10)
RBC, UA: >30 /hpf — ABNORMAL HIGH (ref 0–?)

## 2014-09-03 LAB — CULTURE, URINE COMPREHENSIVE

## 2014-09-04 MED ORDER — NITROFURANTOIN MONOHYD MACRO 100 MG PO CAPS: 100.0000 mg | ORAL_CAPSULE | Freq: Two times a day (BID) | ORAL | Status: DC

## 2014-09-04 NOTE — Telephone Encounter (Signed)
Please let patient/ her daughter know that her urine did grow Enterococcus.    Please treat with macrobid 100 mg bid x 10 days.  Order placed.  Hollice Espy, MD

## 2014-09-04 NOTE — Telephone Encounter (Signed)
Spoke with pt in reference to infection. Made pt aware medication is at pharmacy. Advised pt if daughter has any questions she is welcome to call our office. Pt voiced understanding.

## 2014-09-05 ENCOUNTER — Telehealth: Payer: Self-pay | Admitting: Urology

## 2014-09-05 NOTE — Telephone Encounter (Signed)
The pt caregiver called stating that the pt was currently having side effects to her Marobid. She have only taking one dosage of abx, but now experiencing some symptoms of delusions talking to people who are not present and also chills. I spoke w/ Dr. Erlene Quan and she feels this is not a side effect to the medication, but most like due to her untreated infection. I informed the caregiver and also her daughter Blima Dessert. They both agreed that she should continue to taking the antibiotic, but instructed to monitor and if she develop anymore symptoms to notify our office. I also told the daughter that her mother is allergic to almost every other abx and if she needs to switch abx then we would have to send her to the ER for IV antibiotics.

## 2014-09-06 ENCOUNTER — Telehealth: Payer: Self-pay

## 2014-09-06 NOTE — Telephone Encounter (Signed)
Is patient strong enough to come to the office for a cath specimen?

## 2014-09-06 NOTE — Telephone Encounter (Signed)
Pt daughter, Ronald Pippins, called stating pt is getting weaker and urine is becoming more cloudy. Ronald Pippins stated urine is the same color just more cloudy. Please advise.

## 2014-09-07 NOTE — Telephone Encounter (Signed)
No pt has a broken pelvis.

## 2014-09-08 ENCOUNTER — Other Ambulatory Visit: Payer: Medicare Other

## 2014-09-08 DIAGNOSIS — N390 Urinary tract infection, site not specified: Secondary | ICD-10-CM | POA: Diagnosis not present

## 2014-09-08 LAB — URINALYSIS, COMPLETE
Bilirubin, UA: NEGATIVE
Glucose, UA: NEGATIVE
Ketones, UA: NEGATIVE
NITRITE UA: NEGATIVE
Specific Gravity, UA: 1.015 (ref 1.005–1.030)
Urobilinogen, Ur: 4 mg/dL — ABNORMAL HIGH (ref 0.2–1.0)
pH, UA: 7.5 (ref 5.0–7.5)

## 2014-09-08 LAB — MICROSCOPIC EXAMINATION
EPITHELIAL CELLS (NON RENAL): NONE SEEN /HPF (ref 0–10)
WBC, UA: >30 /hpf — ABNORMAL HIGH (ref 0–?)

## 2014-09-10 ENCOUNTER — Emergency Department: Payer: Medicare Other

## 2014-09-10 ENCOUNTER — Inpatient Hospital Stay
Admission: EM | Admit: 2014-09-10 | Discharge: 2014-09-14 | DRG: 872 | Disposition: A | Discharge status: Skilled Nursing Facility | Payer: Medicare Other | Attending: Internal Medicine | Admitting: Internal Medicine

## 2014-09-10 DIAGNOSIS — M79604 Pain in right leg: Secondary | ICD-10-CM | POA: Diagnosis not present

## 2014-09-10 DIAGNOSIS — Z823 Family history of stroke: Secondary | ICD-10-CM | POA: Diagnosis not present

## 2014-09-10 DIAGNOSIS — Z882 Allergy status to sulfonamides status: Secondary | ICD-10-CM

## 2014-09-10 DIAGNOSIS — N390 Urinary tract infection, site not specified: Secondary | ICD-10-CM

## 2014-09-10 DIAGNOSIS — J9811 Atelectasis: Secondary | ICD-10-CM | POA: Diagnosis present

## 2014-09-10 DIAGNOSIS — M1990 Unspecified osteoarthritis, unspecified site: Secondary | ICD-10-CM | POA: Diagnosis not present

## 2014-09-10 DIAGNOSIS — Z79891 Long term (current) use of opiate analgesic: Secondary | ICD-10-CM

## 2014-09-10 DIAGNOSIS — Z79899 Other long term (current) drug therapy: Secondary | ICD-10-CM

## 2014-09-10 DIAGNOSIS — Z8249 Family history of ischemic heart disease and other diseases of the circulatory system: Secondary | ICD-10-CM

## 2014-09-10 DIAGNOSIS — E785 Hyperlipidemia, unspecified: Secondary | ICD-10-CM | POA: Diagnosis present

## 2014-09-10 DIAGNOSIS — R011 Cardiac murmur, unspecified: Secondary | ICD-10-CM | POA: Diagnosis present

## 2014-09-10 DIAGNOSIS — N200 Calculus of kidney: Secondary | ICD-10-CM | POA: Diagnosis present

## 2014-09-10 DIAGNOSIS — I739 Peripheral vascular disease, unspecified: Secondary | ICD-10-CM | POA: Diagnosis present

## 2014-09-10 DIAGNOSIS — S32591A Other specified fracture of right pubis, initial encounter for closed fracture: Secondary | ICD-10-CM | POA: Diagnosis present

## 2014-09-10 DIAGNOSIS — Z96652 Presence of left artificial knee joint: Secondary | ICD-10-CM | POA: Diagnosis present

## 2014-09-10 DIAGNOSIS — Z880 Allergy status to penicillin: Secondary | ICD-10-CM | POA: Diagnosis not present

## 2014-09-10 DIAGNOSIS — G629 Polyneuropathy, unspecified: Secondary | ICD-10-CM | POA: Diagnosis present

## 2014-09-10 DIAGNOSIS — X58XXXA Exposure to other specified factors, initial encounter: Secondary | ICD-10-CM | POA: Diagnosis present

## 2014-09-10 DIAGNOSIS — Z96641 Presence of right artificial hip joint: Secondary | ICD-10-CM | POA: Diagnosis present

## 2014-09-10 DIAGNOSIS — Z90710 Acquired absence of both cervix and uterus: Secondary | ICD-10-CM | POA: Diagnosis not present

## 2014-09-10 DIAGNOSIS — K219 Gastro-esophageal reflux disease without esophagitis: Secondary | ICD-10-CM | POA: Diagnosis present

## 2014-09-10 DIAGNOSIS — Z87442 Personal history of urinary calculi: Secondary | ICD-10-CM

## 2014-09-10 DIAGNOSIS — Z9889 Other specified postprocedural states: Secondary | ICD-10-CM | POA: Diagnosis not present

## 2014-09-10 DIAGNOSIS — Z66 Do not resuscitate: Secondary | ICD-10-CM | POA: Diagnosis present

## 2014-09-10 DIAGNOSIS — N3000 Acute cystitis without hematuria: Secondary | ICD-10-CM | POA: Diagnosis not present

## 2014-09-10 DIAGNOSIS — I517 Cardiomegaly: Secondary | ICD-10-CM | POA: Diagnosis not present

## 2014-09-10 DIAGNOSIS — E039 Hypothyroidism, unspecified: Secondary | ICD-10-CM | POA: Diagnosis present

## 2014-09-10 DIAGNOSIS — I10 Essential (primary) hypertension: Secondary | ICD-10-CM | POA: Diagnosis present

## 2014-09-10 DIAGNOSIS — Z452 Encounter for adjustment and management of vascular access device: Secondary | ICD-10-CM | POA: Diagnosis not present

## 2014-09-10 DIAGNOSIS — B952 Enterococcus as the cause of diseases classified elsewhere: Secondary | ICD-10-CM | POA: Diagnosis not present

## 2014-09-10 DIAGNOSIS — E538 Deficiency of other specified B group vitamins: Secondary | ICD-10-CM | POA: Diagnosis not present

## 2014-09-10 DIAGNOSIS — J189 Pneumonia, unspecified organism: Secondary | ICD-10-CM | POA: Diagnosis not present

## 2014-09-10 DIAGNOSIS — Z7409 Other reduced mobility: Secondary | ICD-10-CM | POA: Diagnosis not present

## 2014-09-10 DIAGNOSIS — Z87440 Personal history of urinary (tract) infections: Secondary | ICD-10-CM | POA: Diagnosis not present

## 2014-09-10 DIAGNOSIS — Z888 Allergy status to other drugs, medicaments and biological substances status: Secondary | ICD-10-CM | POA: Diagnosis not present

## 2014-09-10 DIAGNOSIS — Z8673 Personal history of transient ischemic attack (TIA), and cerebral infarction without residual deficits: Secondary | ICD-10-CM

## 2014-09-10 DIAGNOSIS — I252 Old myocardial infarction: Secondary | ICD-10-CM

## 2014-09-10 DIAGNOSIS — A419 Sepsis, unspecified organism: Principal | ICD-10-CM | POA: Diagnosis present

## 2014-09-10 DIAGNOSIS — R531 Weakness: Secondary | ICD-10-CM

## 2014-09-10 DIAGNOSIS — R509 Fever, unspecified: Secondary | ICD-10-CM | POA: Diagnosis not present

## 2014-09-10 DIAGNOSIS — K5730 Diverticulosis of large intestine without perforation or abscess without bleeding: Secondary | ICD-10-CM | POA: Diagnosis present

## 2014-09-10 DIAGNOSIS — J309 Allergic rhinitis, unspecified: Secondary | ICD-10-CM | POA: Diagnosis not present

## 2014-09-10 DIAGNOSIS — R918 Other nonspecific abnormal finding of lung field: Secondary | ICD-10-CM | POA: Diagnosis not present

## 2014-09-10 DIAGNOSIS — M6281 Muscle weakness (generalized): Secondary | ICD-10-CM | POA: Diagnosis not present

## 2014-09-10 DIAGNOSIS — S32501A Unspecified fracture of right pubis, initial encounter for closed fracture: Secondary | ICD-10-CM | POA: Diagnosis not present

## 2014-09-10 LAB — COMPREHENSIVE METABOLIC PANEL
ALT: 44 U/L (ref 14–54)
ANION GAP: 7 (ref 5–15)
AST: 30 U/L (ref 15–41)
Albumin: 3.2 g/dL — ABNORMAL LOW (ref 3.5–5.0)
Alkaline Phosphatase: 132 U/L — ABNORMAL HIGH (ref 38–126)
BUN: 17 mg/dL (ref 6–20)
CO2: 26 mmol/L (ref 22–32)
Calcium: 9.2 mg/dL (ref 8.9–10.3)
Chloride: 104 mmol/L (ref 101–111)
Creatinine, Ser: 0.65 mg/dL (ref 0.44–1.00)
GFR calc Af Amer: >60 mL/min (ref >60)
GFR calc non Af Amer: >60 mL/min (ref >60)
GLUCOSE: 118 mg/dL — AB (ref 65–99)
POTASSIUM: 4.1 mmol/L (ref 3.5–5.1)
SODIUM: 137 mmol/L (ref 135–145)
Total Bilirubin: 0.4 mg/dL (ref 0.3–1.2)
Total Protein: 6.5 g/dL (ref 6.5–8.1)

## 2014-09-10 LAB — LIPASE, BLOOD: LIPASE: 28 U/L (ref 22–51)

## 2014-09-10 LAB — URINALYSIS COMPLETE WITH MICROSCOPIC (ARMC ONLY)
BILIRUBIN URINE: NEGATIVE
Glucose, UA: NEGATIVE mg/dL
KETONES UR: NEGATIVE mg/dL
Nitrite: NEGATIVE
PROTEIN: 30 mg/dL — AB
SPECIFIC GRAVITY, URINE: 1.013 (ref 1.005–1.030)
pH: 6 (ref 5.0–8.0)

## 2014-09-10 LAB — PROTIME-INR
INR: 0.99
Prothrombin Time: 13.3 seconds (ref 11.4–15.0)

## 2014-09-10 LAB — LACTIC ACID, PLASMA
LACTIC ACID, VENOUS: 1.1 mmol/L (ref 0.5–2.0)
Lactic Acid, Venous: 0.9 mmol/L (ref 0.5–2.0)

## 2014-09-10 LAB — CBC
HEMATOCRIT: 38.9 % (ref 35.0–47.0)
HEMOGLOBIN: 12.8 g/dL (ref 12.0–16.0)
MCH: 30.9 pg (ref 26.0–34.0)
MCHC: 33 g/dL (ref 32.0–36.0)
MCV: 93.6 fL (ref 80.0–100.0)
Platelets: 220 10*3/uL (ref 150–440)
RBC: 4.15 MIL/uL (ref 3.80–5.20)
RDW: 13.3 % (ref 11.5–14.5)
WBC: 10.7 10*3/uL (ref 3.6–11.0)

## 2014-09-10 LAB — APTT: aPTT: 29 seconds (ref 24–36)

## 2014-09-10 LAB — TROPONIN I: Troponin I: <0.03 ng/mL (ref <0.031)

## 2014-09-10 MED ORDER — VANCOMYCIN HCL IN DEXTROSE 750-5 MG/150ML-% IV SOLN
750.0000 mg | INTRAVENOUS | Status: DC
  Administered 2014-09-11 – 2014-09-13 (×3): 750 mg via INTRAVENOUS
  Filled 2014-09-10 (×6): qty 150

## 2014-09-10 MED ORDER — SODIUM CHLORIDE 0.9 % IJ SOLN
3.0000 mL | Freq: Two times a day (BID) | INTRAMUSCULAR | Status: DC
  Administered 2014-09-10 – 2014-09-14 (×6): 3 mL via INTRAVENOUS

## 2014-09-10 MED ORDER — ENSURE ENLIVE PO LIQD
237.0000 mL | Freq: Two times a day (BID) | ORAL | Status: DC
  Administered 2014-09-11 – 2014-09-12 (×4): 237 mL via ORAL

## 2014-09-10 MED ORDER — ENOXAPARIN SODIUM 40 MG/0.4ML ~~LOC~~ SOLN
40.0000 mg | SUBCUTANEOUS | Status: DC
  Administered 2014-09-11 – 2014-09-13 (×3): 40 mg via SUBCUTANEOUS
  Filled 2014-09-10 (×4): qty 0.4

## 2014-09-10 MED ORDER — DEXTROSE 5 % IV SOLN: 2.0000 g | Freq: Three times a day (TID) | INTRAVENOUS | Status: DC

## 2014-09-10 MED ORDER — PREGABALIN 25 MG PO CAPS
50.0000 mg | ORAL_CAPSULE | Freq: Two times a day (BID) | ORAL | Status: DC
  Administered 2014-09-10 – 2014-09-14 (×8): 50 mg via ORAL
  Filled 2014-09-10 (×8): qty 2

## 2014-09-10 MED ORDER — SODIUM CHLORIDE 0.9 % IV SOLN
INTRAVENOUS | Status: DC
  Administered 2014-09-10 – 2014-09-13 (×5): via INTRAVENOUS

## 2014-09-10 MED ORDER — METOPROLOL SUCCINATE ER 25 MG PO TB24
12.5000 mg | ORAL_TABLET | ORAL | Status: DC
  Administered 2014-09-11 – 2014-09-14 (×4): 12.5 mg via ORAL
  Filled 2014-09-10 (×4): qty 1

## 2014-09-10 MED ORDER — ACETAMINOPHEN 325 MG PO TABS
650.0000 mg | ORAL_TABLET | Freq: Once | ORAL | Status: AC | PRN
  Administered 2014-09-10: 650 mg via ORAL
  Filled 2014-09-10: qty 2

## 2014-09-10 MED ORDER — ACETAMINOPHEN 325 MG PO TABS
650.0000 mg | ORAL_TABLET | Freq: Four times a day (QID) | ORAL | Status: DC | PRN
  Administered 2014-09-10: 325 mg via ORAL
  Administered 2014-09-11 – 2014-09-14 (×3): 650 mg via ORAL
  Filled 2014-09-10 (×2): qty 2
  Filled 2014-09-10: qty 1
  Filled 2014-09-10: qty 2

## 2014-09-10 MED ORDER — LEVOTHYROXINE SODIUM 25 MCG PO TABS
25.0000 ug | ORAL_TABLET | Freq: Every day | ORAL | Status: DC
  Administered 2014-09-11 – 2014-09-14 (×4): 25 ug via ORAL
  Filled 2014-09-10 (×5): qty 1

## 2014-09-10 MED ORDER — VANCOMYCIN HCL IN DEXTROSE 750-5 MG/150ML-% IV SOLN
750.0000 mg | Freq: Once | INTRAVENOUS | Status: AC
  Administered 2014-09-10: 750 mg via INTRAVENOUS
  Filled 2014-09-10: qty 150

## 2014-09-10 MED ORDER — DOCUSATE SODIUM 100 MG PO CAPS: 100.0000 mg | ORAL_CAPSULE | Freq: Two times a day (BID) | ORAL | Status: DC | PRN

## 2014-09-10 MED ORDER — AMLODIPINE BESYLATE 5 MG PO TABS
5.0000 mg | ORAL_TABLET | Freq: Every day | ORAL | Status: DC
  Administered 2014-09-11 – 2014-09-14 (×4): 5 mg via ORAL
  Filled 2014-09-10 (×4): qty 1

## 2014-09-10 MED ORDER — DEXTROSE 5 % IV SOLN
2.0000 g | Freq: Once | INTRAVENOUS | Status: AC
  Administered 2014-09-10: 2 g via INTRAVENOUS
  Filled 2014-09-10: qty 2

## 2014-09-10 MED ORDER — SENNA 8.6 MG PO TABS: 1.0000 | ORAL_TABLET | Freq: Every day | ORAL | Status: DC | PRN

## 2014-09-10 MED ORDER — SODIUM CHLORIDE 0.9 % IV BOLUS (SEPSIS)
1000.0000 mL | INTRAVENOUS | Status: AC
  Administered 2014-09-10: 1000 mL via INTRAVENOUS

## 2014-09-10 MED ORDER — HYDROCODONE-ACETAMINOPHEN 5-325 MG PO TABS
1.0000 | ORAL_TABLET | ORAL | Status: DC | PRN
  Administered 2014-09-11: 1 via ORAL
  Filled 2014-09-10: qty 1

## 2014-09-10 MED ORDER — PRAVASTATIN SODIUM 20 MG PO TABS
40.0000 mg | ORAL_TABLET | Freq: Every evening | ORAL | Status: DC
  Administered 2014-09-10 – 2014-09-13 (×4): 40 mg via ORAL
  Filled 2014-09-10 (×4): qty 2

## 2014-09-10 MED ORDER — ALBUTEROL SULFATE (2.5 MG/3ML) 0.083% IN NEBU: 2.5000 mg | INHALATION_SOLUTION | RESPIRATORY_TRACT | Status: DC | PRN

## 2014-09-10 MED ORDER — ACETAMINOPHEN 650 MG RE SUPP: 650.0000 mg | Freq: Four times a day (QID) | RECTAL | Status: DC | PRN

## 2014-09-10 NOTE — ED Notes (Signed)
Pt from home via EMS c/o weakness since last Wednesday, recent dx of UTI currently on abx, also recent pelvic fracture without surgery. Reports temp 100.3 PTA> Pt has had profound weakness and SOB since yesterday.

## 2014-09-10 NOTE — Progress Notes (Signed)
ANTIBIOTIC CONSULT NOTE - INITIAL  Pharmacy Consult for Vancomycin Indication: MDR Enterococcus UTI  Allergies  Allergen Reactions  . Sulfa Antibiotics Hives and Itching  . Ciprofloxacin Hives and Itching  . Doxycycline Hives and Itching  . Ferrous Sulfate Hives and Itching  . Levofloxacin Hives and Itching  . Penicillins Hives and Itching    Patient Measurements: Height: 5\' 2"  (157.5 cm) Weight: 114 lb (51.71 kg) IBW/kg (Calculated) : 50.1 Adjusted Body Weight: n/a  Vital Signs: Temp: 98.2 F (36.8 C) (08/21 2130) Temp Source: Oral (08/21 2130) BP: 122/41 mmHg (08/21 2130) Pulse Rate: 80 (08/21 2130) Intake/Output from previous day:   Intake/Output from this shift:    Labs:  Recent Labs  09/10/14 1709  WBC 10.7  HGB 12.8  PLT 220  CREATININE 0.65   Estimated Creatinine Clearance: 42.9 mL/min (by C-G formula based on Cr of 0.65). No results for input(s): VANCOTROUGH, VANCOPEAK, VANCORANDOM, GENTTROUGH, GENTPEAK, GENTRANDOM, TOBRATROUGH, TOBRAPEAK, TOBRARND, AMIKACINPEAK, AMIKACINTROU, AMIKACIN in the last 72 hours.   Microbiology: Recent Results (from the past 720 hour(s))  Microscopic Examination     Status: Abnormal   Collection Time: 09/01/14  4:24 PM  Result Value Ref Range Status   WBC, UA >30 (H) 0 -  5 /hpf Final   RBC, UA >30 (H) 0 -  2 /hpf Final   Epithelial Cells (non renal) None seen 0 - 10 /hpf Final   Crystals Present (A) N/A Final   Crystal Type Amorphous Sediment N/A Final   Bacteria, UA Many (A) None seen/Few Final  CULTURE, URINE COMPREHENSIVE     Status: Abnormal   Collection Time: 09/01/14  4:25 PM  Result Value Ref Range Status   Urine Culture, Comprehensive Final report (A)  Final   Result 1 Enterococcus species (A)  Final    Comment: Greater than 100,000 colony forming units per mL Note: this isolate is vancomycin-susceptible. This information is provided for epidemiologic purposes only: vancomycin is not among the antibiotics  recommended for therapy of urinary tract infections caused by Enterococcus.    Result 2 Comment  Final    Comment: Mixed urogenital flora 10,000-25,000 colony forming units per mL    ANTIMICROBIAL SUSCEPTIBILITY Comment  Final    Comment:       ** S = Susceptible; I = Intermediate; R = Resistant **                    P = Positive; N = Negative             MICS are expressed in micrograms per mL    Antibiotic                 RSLT#1    RSLT#2    RSLT#3    RSLT#4 Ciprofloxacin                  R Levofloxacin                   R Nitrofurantoin                 S Penicillin                     S Tetracycline                   R Vancomycin                     S  CULTURE, URINE COMPREHENSIVE     Status: None (Preliminary result)   Collection Time: 09/08/14  8:43 AM  Result Value Ref Range Status   Result 1 Comment  Final    Comment: Microbiological testing to rule out the presence of possible pathogens is in progress.   Microscopic Examination     Status: Abnormal   Collection Time: 09/08/14  8:43 AM  Result Value Ref Range Status   WBC, UA >30 (H) 0 -  5 /hpf Final   RBC, UA >30 (H) 0 -  2 /hpf Final   Epithelial Cells (non renal) None seen 0 - 10 /hpf Final   Bacteria, UA Many (A) None seen/Few Final    Medical History: Past Medical History  Diagnosis Date  . Hypertension   . Hyperlipidemia   . Arthritis   . Hemihypertrophy   . Myocardial infarction   . Thyroid disease   . CVA (cerebral infarction) 2003  . Peripheral vascular disease     s/p stent right leg  . Pulmonary nodule     stable on Chest CT March 2014, Follow[facility] at [facility]  . Murmur, cardiac   . Chronic cystitis   . Hematuria   . Bleeding disorder   . Recurrent UTI   . Gross hematuria   . Urinary frequency   . Dysuria   . Sensory urge incontinence   . History of kidney stones     Medications:  Anti-infectives    Start     Dose/Rate Route Frequency Ordered Stop   09/11/14 1100  vancomycin (VANCOCIN) IVPB  750 mg/150 ml premix     750 mg 150 mL/hr over 60 Minutes Intravenous Every 24 hours 09/10/14 2204     09/10/14 2300  vancomycin (VANCOCIN) IVPB 750 mg/150 ml premix     750 mg 150 mL/hr over 60 Minutes Intravenous  Once 09/10/14 2204     09/10/14 1730  cefTAZidime (FORTAZ) 2 g in dextrose 5 % 50 mL IVPB     2 g 100 mL/hr over 30 Minutes Intravenous  Once 09/10/14 1730 09/10/14 1952   09/10/14 1715  cefTAZidime (FORTAZ) 2 g in dextrose 5 % 50 mL IVPB  Status:  Discontinued     2 g 100 mL/hr over 30 Minutes Intravenous 3 times per day 09/10/14 1701 09/10/14 2137     Assessment: Patient with MDR enterococcus UTI that failed outpatient therapy with nitrofurantoin. Vancomycin per pharmacist ordered.  Goal of Therapy:  Vancomycin trough level 15-20 mcg/ml  Plan:  Measure antibiotic drug levels at steady state Follow up culture results Will order Vancomycin 750mg  IV once followed in 12 hours by 750mg  IV q24h for stacked dosing. Will check a trough level prior to 5th dose.  Paulina Fusi, PharmD, BCPS 09/10/2014 10:07 PM

## 2014-09-10 NOTE — Telephone Encounter (Signed)
Patient's caregiver brought in a clean catch specimen and that is been sent for culture.

## 2014-09-10 NOTE — ED Notes (Signed)
Dr. Tressia Miners notified of pt's one liter ns bolus per [place], rn. No new bolus orders received.

## 2014-09-10 NOTE — ED Notes (Signed)
Pt eating crackers

## 2014-09-10 NOTE — ED Notes (Signed)
Pt sipping on po fluids.  

## 2014-09-10 NOTE — ED Provider Notes (Signed)
[facility] Emergency Department Provider Note  ____________________________________________  Time seen: Approximately 4:51 PM  I have reviewed the triage vital signs and the nursing notes.   HISTORY  Chief Complaint Weakness and Code Sepsis    HPI Krista Ellis is a 81 y.o. female with extensive past medical history and a history of frequent urinary tract infections who is currently taking Macrobidpresents from her facility with a fever and generalized weakness, shortness of breath, and decreased mental acuity.  She has a recent hip fracture and she is minimally mobile as a result.the symptoms are severe and gradual in onset but rapidly worse as of today.  The patient is 81 years old and ill so as not able to provide much detail, but she is pleasant and alert.   Past Medical History  Diagnosis Date  . Hypertension   . Hyperlipidemia   . Arthritis   . Hemihypertrophy   . Myocardial infarction   . Thyroid disease   . CVA (cerebral infarction) 2003  . Peripheral vascular disease     s/p stent right leg  . Pulmonary nodule     stable on Chest CT March 2014, Followed at [facility]  . Murmur, cardiac   . Chronic cystitis   . Hematuria   . Bleeding disorder   . Recurrent UTI   . Gross hematuria   . Urinary frequency   . Dysuria   . Sensory urge incontinence   . History of kidney stones     Patient Active Problem List   Diagnosis Date Noted  . Sepsis 09/10/2014  . Open wound of knee, leg (except thigh), and ankle, complicated 51/70/0174  . Hospital discharge follow-up 07/04/2014  . Facial droop   . Malnutrition of moderate degree 05/21/2014  . Seizures   . TIA (transient ischemic attack) 05/18/2014  . Systolic murmur 94/49/6759  . Gait disturbance 05/15/2014  . History of subdural hematoma   . Subdural hematoma 05/05/2014  . B12 deficiency 03/09/2014  . Recurrent UTI 01/03/2014  . Bilateral hand numbness 01/03/2014  . Nephrolithiasis  11/18/2013  . Recurrent falls 11/18/2013  . Abnormal CT scan, chest 09/01/2013  . Chest wall pain 08/31/2013  . Hematoma and contusion 08/31/2013  . Atherosclerotic peripheral vascular disease 06/01/2013  . Bladder wall hemorrhage 12/10/2012  . Postmenopausal estrogen deficiency 11/30/2012  . Medicare annual wellness visit, subsequent 10/01/2011  . Dyslipidemia 10/01/2011  . Hypertension 10/17/2010  . Hypothyroidism 10/17/2010  . Osteoarthritis 10/17/2010    Past Surgical History  Procedure Laterality Date  . Abdominal hysterectomy  1982  . Total hip arthroplasty      right  . Spine surgery      steel rod in back  . Tonsillectomy    . Left rotator cuff      repair  . Total knee arthroplasty Left   . Hip surgery  2007    replaced ball in right hip  . Cardiac surgery      2 failed stents in right leg  . Stent placement leg Right 2015  . Carpal tunnel release    . Appendectomy    . Ruptured disc      Current Outpatient Rx  Name  Route  Sig  Dispense  Refill  . acetaminophen (TYLENOL) 325 MG tablet   Oral   Take 325 mg by mouth every 6 (six) hours as needed for mild pain or headache.         Marland Kitchen amLODipine (NORVASC) 5 MG tablet  Oral   Take 5 mg by mouth daily.         . cyanocobalamin (,VITAMIN B-12,) 1000 MCG/ML injection   Intramuscular   Inject 1,000 mcg into the muscle every 30 (thirty) days.          . feeding supplement, ENSURE ENLIVE, (ENSURE ENLIVE) LIQD   Oral   Take 237 mLs by mouth 2 (two) times daily after a meal.   237 mL   12   . levothyroxine (SYNTHROID, LEVOTHROID) 25 MCG tablet      TAKE 1 TABLET (25 MCG TOTAL) BY MOUTH DAILY. Patient taking differently: Take 25 mcg by mouth daily before breakfast.    90 tablet   1   . metoprolol succinate (TOPROL-XL) 25 MG 24 hr tablet   Oral   Take 12.5 mg by mouth every morning.          . nitrofurantoin, macrocrystal-monohydrate, (MACROBID) 100 MG capsule   Oral   Take 1 capsule (100 mg  total) by mouth every 12 (twelve) hours.   20 capsule   0   . oxyCODONE-acetaminophen (ROXICET) 5-325 MG per tablet   Oral   Take 1 tablet by mouth every 6 (six) hours as needed.   20 tablet   0   . pravastatin (PRAVACHOL) 40 MG tablet   Oral   Take 1 tablet (40 mg total) by mouth every evening.   90 tablet   1   . pregabalin (LYRICA) 50 MG capsule   Oral   Take 1 capsule (50 mg total) by mouth 2 (two) times daily.   60 capsule   6   . gentamicin ointment (GARAMYCIN) 0.1 %   Topical   Apply 1 application topically 3 (three) times daily. Patient not taking: Reported on 08/18/2014   15 g   0   . HYDROcodone-acetaminophen (NORCO/VICODIN) 5-325 MG per tablet   Oral   Take 1 tablet by mouth 3 (three) times daily as needed for moderate pain. Patient taking differently: Take 1 tablet by mouth every 4 (four) hours as needed for moderate pain.    30 tablet   0   . loratadine (CLARITIN) 10 MG tablet   Oral   Take 1 tablet (10 mg total) by mouth daily. Patient not taking: Reported on 08/18/2014           Allergies Sulfa antibiotics; Ciprofloxacin; Doxycycline; Ferrous sulfate; Levofloxacin; and Penicillins  Family History  Problem Relation Age of Onset  . Hypertension Mother   . Transient ischemic attack Mother   . Kidney disease Neg Hx   . Bladder Cancer Neg Hx   . Prostate cancer Neg Hx     Social History Social History  Substance Use Topics  . Smoking status: Never Smoker   . Smokeless tobacco: Never Used  . Alcohol Use: No    Review of Systems Constitutional: fever of 100.3 at her facility Eyes: No visual changes. ENT: No sore throat. Cardiovascular: Denies chest pain. Respiratory: increasing shortness of breath for several days, worse today Gastrointestinal: No abdominal pain.  No nausea, no vomiting.  No diarrhea.  No constipation. Genitourinary: Negative for dysuria.Recent urinary tract infection Musculoskeletal: Negative for back pain. Skin:  Negative for rash. Neurological: Negative for headaches, focal weakness or numbness. generalized weakness/malaise  10-point ROS otherwise negative.  ____________________________________________   PHYSICAL EXAM:  ED Triage Vitals  Enc Vitals Group     BP 09/10/14 1650 149/54 mmHg     Pulse Rate 09/10/14 1650 112  Resp 09/10/14 1650 21     Temp 09/10/14 1650 102.6 F (39.2 C)     Temp Source 09/10/14 1650 Oral     SpO2 09/10/14 1650 90 %     Weight 09/10/14 1650 114 lb (51.71 kg)     Height 09/10/14 1650 5\' 2"  (1.575 m)     Head Cir --      Peak Flow --      Pain Score 09/10/14 1651 5     Pain Loc --      Pain Edu? --      Excl. in [place]? --     Constitutional: Alert and oriented to person and place. No acute distress and non-toxic appearing. Eyes: Conjunctivae are normal. PERRL. EOMI. Head: Atraumatic. Nose: No congestion/rhinnorhea. Mouth/Throat: Mucous membranes are moist.  Oropharynx non-erythematous. Neck: No stridor.   Cardiovascular: tachycardia, regular rhythm. Grossly normal heart sounds.  Good peripheral circulation. Respiratory: Normal respiratory effort.  No retractions. Crackles in bilateral lung bases Gastrointestinal: Soft and nontender. No distention. No abdominal bruits. No CVA tenderness. Musculoskeletal: No lower extremity tenderness nor edema.  No joint effusions. Neurologic:  Normal speech and language. No gross focal neurologic deficits are appreciated.  Skin:  Skin is warm, dry and intact. No rash noted. Psychiatric: Mood and affect are normal. Speech and behavior are normal.  ____________________________________________   LABS (all labs ordered are listed, but only abnormal results are displayed)  Labs Reviewed  COMPREHENSIVE METABOLIC PANEL - Abnormal; Notable for the following:    Glucose, Bld 118 (*)    Albumin 3.2 (*)    Alkaline Phosphatase 132 (*)    All other components within normal limits  URINALYSIS COMPLETEWITH MICROSCOPIC ([facility]  ONLY) - Abnormal; Notable for the following:    Color, Urine YELLOW (*)    APPearance HAZY (*)    Hgb urine dipstick 2+ (*)    Protein, ur 30 (*)    Leukocytes, UA 3+ (*)    Bacteria, UA MANY (*)    Squamous Epithelial / LPF 0-5 (*)    All other components within normal limits  CULTURE, BLOOD (ROUTINE X 2)  CULTURE, BLOOD (ROUTINE X 2)  URINE CULTURE  LACTIC ACID, PLASMA  CBC  LIPASE, BLOOD  TROPONIN I  APTT  PROTIME-INR  LACTIC ACID, PLASMA  urine white blood cells are too numerous to count ____________________________________________  EKG  ED ECG REPORT I, Cherell Colvin, the attending physician, personally viewed and interpreted this ECG.   Date: 09/10/2014  EKG Time: 16:34  Rate: 110  Rhythm: sinus tachycardia  Axis: normal  Intervals:normal  ST&T Change: Non-specific ST segment / T-wave changes, but no evidence of acute ischemia.   ____________________________________________  RADIOLOGY I, Rilya Longo, personally viewed and evaluated these images (plain radiographs) as part of my medical decision making.   Dg Chest Port 1 View  09/10/2014   CLINICAL DATA:  Pt from home via EMS c/o weakness since last Wednesday, recent dx of UTI currently on abx, also recent pelvic fracture without surgery. Reports temp 100.3 PTA> Pt has had profound weakness and SOB since yesterday.  EXAM: PORTABLE CHEST - 1 VIEW  COMPARISON:  05/09/2010.  Chest CT, 09/25/2013.  FINDINGS: There is reticular and hazy opacity at the lung bases, left greater than right, increased the prior exam. This most likely atelectasis. Pneumonia, particular at the left base common is possible.  Mild prominence of the interstitial markings is noted bilaterally. There is no convincing pulmonary edema. No pneumothorax.  Cardiac  silhouette is normal in size and configuration. No mediastinal or hilar masses or evidence of adenopathy.  Bony thorax is demineralized. There arthropathic changes of the left shoulder.   IMPRESSION: 1. Left greater than right lung base opacity, which may all be due to atelectasis. Pneumonia is possible. No evidence of pulmonary edema.   Electronically Signed   By: Lajean Manes M.D.   On: 09/10/2014 17:32   Ct Renal Stone Study  09/10/2014   CLINICAL DATA:  Left lower quadrant pain for 5 days. Recent diagnosis of urinary tract infection. Recent pelvic fracture.  EXAM: CT ABDOMEN AND PELVIS WITHOUT CONTRAST  TECHNIQUE: Multidetector CT imaging of the abdomen and pelvis was performed following the standard protocol without IV contrast.  COMPARISON:  CT abdomen and pelvis 11/18/2013.  FINDINGS: The patient has small bilateral pleural effusions. Basilar airspace disease is more notable on the left. Heart size is mildly enlarged. No pericardial effusion.  The gallbladder, liver, spleen, adrenal glands, pancreas and right kidney appear normal. A nonobstructing stone lower pole the left kidney measures 0.4 cm. A small left renal cyst is identified.  There is extensive aortoiliac atherosclerosis without aneurysm. The patient is status post hysterectomy. Sigmoid diverticulosis without diverticulitis is identified. The colon is otherwise unremarkable. The stomach and small bowel appear normal. No lymphadenopathy or fluid.  The patient has marked multilevel thoracic and lumbar spondylosis. Postoperative change of L4-5 fusion is again seen. An acute or subacute right inferior pubic ramus fracture is noted. There is new patchy sclerosis throughout the T11 vertebral body.  IMPRESSION: Small bilateral pleural effusions and basilar airspace disease, worse on the left. Airspace disease in the left lung base could be atelectasis or pneumonia. Airspace opacity on the right has an appearance most consistent with atelectasis.  New sclerosis in the T11 vertebral body may be degenerative but cannot be definitively characterized and sclerotic metastasis is within the differential. Recommend whole-body bone scan for  further evaluation.  Acute or subacute right inferior pubic ramus fracture.  Sigmoid diverticulosis without diverticulitis.  0.4 cm nonobstructing stone lower pole left kidney.  Atherosclerosis.   Electronically Signed   By: Inge Rise M.D.   On: 09/10/2014 19:49    ____________________________________________   PROCEDURES  Procedure(s) performed: None  Critical Care performed: Yes, see critical care note(s)   CRITICAL CARE Performed by: Hinda Kehr   Total critical care time: 40 minutes  Critical care time was exclusive of separately billable procedures and treating other patients.  Critical care was necessary to treat or prevent imminent or life-threatening deterioration.  Critical care was time spent personally by me on the following activities: development of treatment plan with patient and/or surrogate as well as nursing, discussions with consultants, evaluation of patient's response to treatment, examination of patient, obtaining history from patient or surrogate, ordering and performing treatments and interventions, ordering and review of laboratory studies, ordering and review of radiographic studies, pulse oximetry and re-evaluation of patient's condition.  ____________________________________________   INITIAL IMPRESSION / ASSESSMENT AND PLAN / ED COURSE  Pertinent labs & imaging results that were available during my care of the patient were reviewed by me and considered in my medical decision making (see chart for details).  I evaluated the patient initially and confirmed that she is a code sepsis.  All of the standard order set with the exception of the ABG were ordered; I do not believe that the ABG will add significant information at this time.  The patient is stable and protecting  her airway with no difficulty breathing and a good blood pressure.  We will continue to evaluate and start empiric antibiotics for presumed urinary tract infection but will also  strongly consider pneumonia.  ----------------------------------------- 6:51 PM on 09/10/2014 -----------------------------------------  I reevaluated the patient twice and she remains stable.  She has grossly infected urine and I treated her with appropriate antibiotics.  Additionally, her x-ray is concerning for possible bilateral pneumonia, and in the setting of her recent shortness of breath, this must be strongly considered.  Cultures have been sent.  I admitted the patient to the hospitalist for further management.  However, upon reviewing her history, I see that she has a history of kidney stones.  I am concerned that she may have an infected stone which would explain her recurrent urinary tract infections.  Before she leaves the emergency department I will obtain a CT renal protocol for evaluate for possible stones given that a missed stone would have very high morbidity/mortality.  ----------------------------------------- 8:31 PM on 09/10/2014 -----------------------------------------  No evidence of stones on scan.  Appropriate for admission, no indication for urological consultation ____________________________________________  FINAL CLINICAL IMPRESSION(S) / ED DIAGNOSES  Final diagnoses:  Sepsis, due to unspecified organism  Complicated UTI (urinary tract infection)  CAP (community acquired pneumonia)  Sepsis      NEW MEDICATIONS STARTED DURING THIS VISIT:  New Prescriptions   No medications on file     Hinda Kehr, MD 09/10/14 2031

## 2014-09-10 NOTE — H&P (Signed)
[facility] [place] NAME: Krista Ellis    MR#:  [id number]  DATE OF BIRTH:  1930/09/15  DATE OF ADMISSION:  09/10/2014  PRIMARY CARE PHYSICIAN: [name], MD   REQUESTING/REFERRING PHYSICIAN: Dr. Hinda Kehr  CHIEF COMPLAINT:   Chief Complaint  Patient presents with  . Weakness  . Code Sepsis    HISTORY OF PRESENT ILLNESS:  Krista Ellis  is a 81 y.o. female with a known history of hypertension, hyperlipidemia, history of fall and subdural hematoma in April 2016, history of recurrent urinary tract infections, peripheral vascular disease who lives at home by herself with 24x7 caregivers was brought into the hospital secondary to worsening fever and weakness. Most of the history obtained from daughters at bedside. They say that she has been having several UTIs in the last few years. She was just seen by Dr. Erlene Quan from urology as well. Urine cultures from 09/01/2014 as an outpatient were growing enterococcus based on the sensitivity she was placed on Macrobid. According to Dr. Erlene Quan, if her symptoms did not improve she would need to be on IV antibiotics. Sensitivities of the enterococcus from 09/01/2014 indicated that it's only sensitive to vancomycin and penicillin. Patient does have penicillin allergy. Patient was having fevers about 102F today. She was nauseous, complaining of difficulty breathing and worsened weakness. She is being admitted for sepsis secondary to UTI today.  PAST MEDICAL HISTORY:   Past Medical History  Diagnosis Date  . Hypertension   . Hyperlipidemia   . Arthritis   . Hemihypertrophy   . Myocardial infarction   . Thyroid disease   . CVA (cerebral infarction) 2003  . Peripheral vascular disease     s/p stent right leg  . Pulmonary nodule     stable on Chest CT March 2014, Followed at [facility]  . Murmur, cardiac   . Chronic cystitis   . Hematuria   . Bleeding disorder   . Recurrent UTI    . Gross hematuria   . Urinary frequency   . Dysuria   . Sensory urge incontinence   . History of kidney stones     PAST SURGICAL HISTORY:   Past Surgical History  Procedure Laterality Date  . Abdominal hysterectomy  1982  . Total hip arthroplasty      right  . Spine surgery      steel rod in back  . Tonsillectomy    . Left rotator cuff      repair  . Total knee arthroplasty Left   . Hip surgery  2007    replaced ball in right hip  . Cardiac surgery      2 failed stents in right leg  . Stent placement leg Right 2015  . Carpal tunnel release    . Appendectomy    . Ruptured disc      SOCIAL HISTORY:   Social History  Substance Use Topics  . Smoking status: Never Smoker   . Smokeless tobacco: Never Used  . Alcohol Use: No    FAMILY HISTORY:   Family History  Problem Relation Age of Onset  . Hypertension Mother   . Transient ischemic attack Mother   . Kidney disease Neg Hx   . Bladder Cancer Neg Hx   . Prostate cancer Neg Hx     DRUG ALLERGIES:   Allergies  Allergen Reactions  . Sulfa Antibiotics Hives and Itching  . Ciprofloxacin Hives and Itching  . Doxycycline Hives and  Itching  . Ferrous Sulfate Hives and Itching  . Levofloxacin Hives and Itching  . Penicillins Hives and Itching    REVIEW OF SYSTEMS:   Review of Systems  Constitutional: Positive for fever. Negative for chills, weight loss and malaise/fatigue.  HENT: Negative for ear discharge, ear pain, hearing loss, nosebleeds and tinnitus.   Eyes: Negative for blurred vision, double vision and photophobia.  Respiratory: Negative for cough, hemoptysis, shortness of breath and wheezing.   Cardiovascular: Negative for chest pain, palpitations, orthopnea and leg swelling.  Gastrointestinal: Negative for heartburn, nausea, vomiting, abdominal pain, diarrhea, constipation and melena.  Genitourinary: Positive for dysuria. Negative for urgency, frequency and hematuria.  Musculoskeletal: Negative for  myalgias, back pain and neck pain.  Skin: Negative for rash.  Neurological: Positive for weakness. Negative for dizziness, tingling, tremors, sensory change, speech change, focal weakness and headaches.  Endo/Heme/Allergies: Does not bruise/bleed easily.  Psychiatric/Behavioral: Negative for depression.    MEDICATIONS AT HOME:   Prior to Admission medications   Medication Sig Start Date End Date Taking? Authorizing Provider  acetaminophen (TYLENOL) 325 MG tablet Take 325 mg by mouth every 6 (six) hours as needed for mild pain or headache.   Yes Historical Provider, MD  amLODipine (NORVASC) 5 MG tablet Take 5 mg by mouth daily.   Yes Historical Provider, MD  cyanocobalamin (,VITAMIN B-12,) 1000 MCG/ML injection Inject 1,000 mcg into the muscle every 30 (thirty) days.    Yes Historical Provider, MD  feeding supplement, ENSURE ENLIVE, (ENSURE ENLIVE) LIQD Take 237 mLs by mouth 2 (two) times daily after a meal. 05/24/14  Yes Maryann Mikhail, DO  levothyroxine (SYNTHROID, LEVOTHROID) 25 MCG tablet TAKE 1 TABLET (25 MCG TOTAL) BY MOUTH DAILY. Patient taking differently: Take 25 mcg by mouth daily before breakfast.  07/04/14  Yes Jackolyn Confer, MD  metoprolol succinate (TOPROL-XL) 25 MG 24 hr tablet Take 12.5 mg by mouth every morning.    Yes Historical Provider, MD  nitrofurantoin, macrocrystal-monohydrate, (MACROBID) 100 MG capsule Take 1 capsule (100 mg total) by mouth every 12 (twelve) hours. 09/04/14  Yes Hollice Espy, MD  oxyCODONE-acetaminophen (ROXICET) 5-325 MG per tablet Take 1 tablet by mouth every 6 (six) hours as needed. 08/18/14  Yes Harvest Dark, MD  pravastatin (PRAVACHOL) 40 MG tablet Take 1 tablet (40 mg total) by mouth every evening. 07/04/14  Yes Jackolyn Confer, MD  pregabalin (LYRICA) 50 MG capsule Take 1 capsule (50 mg total) by mouth 2 (two) times daily. 07/04/14  Yes Melvenia Beam, MD  gentamicin ointment (GARAMYCIN) 0.1 % Apply 1 application topically 3 (three) times  daily. Patient not taking: Reported on 08/18/2014 08/11/14   Jackolyn Confer, MD  HYDROcodone-acetaminophen (NORCO/VICODIN) 5-325 MG per tablet Take 1 tablet by mouth 3 (three) times daily as needed for moderate pain. Patient taking differently: Take 1 tablet by mouth every 4 (four) hours as needed for moderate pain.  05/24/14   Maryann Mikhail, DO  loratadine (CLARITIN) 10 MG tablet Take 1 tablet (10 mg total) by mouth daily. Patient not taking: Reported on 08/18/2014 05/24/14   Cristal Ford, DO      VITAL SIGNS:  Blood pressure 101/51, pulse 99, temperature 99.8 F (37.7 C), temperature source Oral, resp. rate 22, height 5\' 2"  (1.575 m), weight 51.71 kg (114 lb), SpO2 95 %.  PHYSICAL EXAMINATION:   Physical Exam  GENERAL:  81 y.o.-year-old  Thin patient lying in the bed with no acute distress.  EYES: Pupils equal, round, reactive to  light and accommodation. No scleral icterus. Extraocular muscles intact. Post surgical pupils noted.  HEENT: Head atraumatic, normocephalic. Oropharynx and nasopharynx clear.  NECK:  Supple, no jugular venous distention. No thyroid enlargement, no tenderness.  LUNGS: Normal breath sounds bilaterally, no wheezing, rales,rhonchi or crepitation. No use of accessory muscles of respiration. Decreased bibasilar breath sounds  CARDIOVASCULAR: S1, S2 XLKGMW.1/0 systolic  Murmur present, no  rubs, or gallops.  ABDOMEN: Soft, nontender, nondistended. Bowel sounds present. No organomegaly or mass.  EXTREMITIES: No pedal edema, cyanosis, or clubbing.  NEUROLOGIC: Cranial nerves II through XII are intact. Muscle strength 4/5 in all extremities. Sensation intact. Gait not checked.  PSYCHIATRIC: The patient is alert and oriented x 3.  SKIN: No obvious rash, lesion, or ulcer.   LABORATORY PANEL:   CBC  Recent Labs Lab 09/10/14 1709  WBC 10.7  HGB 12.8  HCT 38.9  PLT 220    ------------------------------------------------------------------------------------------------------------------  Chemistries   Recent Labs Lab 09/10/14 1709  NA 137  K 4.1  CL 104  CO2 26  GLUCOSE 118*  BUN 17  CREATININE 0.65  CALCIUM 9.2  AST 30  ALT 44  ALKPHOS 132*  BILITOT 0.4   ------------------------------------------------------------------------------------------------------------------  Cardiac Enzymes  Recent Labs Lab 09/10/14 1709  TROPONINI <0.03   ------------------------------------------------------------------------------------------------------------------  RADIOLOGY:  Dg Chest Port 1 View  09/10/2014   CLINICAL DATA:  Pt from home via EMS c/o weakness since last Wednesday, recent dx of UTI currently on abx, also recent pelvic fracture without surgery. Reports temp 100.3 PTA> Pt has had profound weakness and SOB since yesterday.  EXAM: PORTABLE CHEST - 1 VIEW  COMPARISON:  05/09/2010.  Chest CT, 09/25/2013.  FINDINGS: There is reticular and hazy opacity at the lung bases, left greater than right, increased the prior exam. This most likely atelectasis. Pneumonia, particular at the left base common is possible.  Mild prominence of the interstitial markings is noted bilaterally. There is no convincing pulmonary edema. No pneumothorax.  Cardiac silhouette is normal in size and configuration. No mediastinal or hilar masses or evidence of adenopathy.  Bony thorax is demineralized. There arthropathic changes of the left shoulder.  IMPRESSION: 1. Left greater than right lung base opacity, which may all be due to atelectasis. Pneumonia is possible. No evidence of pulmonary edema.   Electronically Signed   By: Lajean Manes M.D.   On: 09/10/2014 17:32   Ct Renal Stone Study  09/10/2014   CLINICAL DATA:  Left lower quadrant pain for 5 days. Recent diagnosis of urinary tract infection. Recent pelvic fracture.  EXAM: CT ABDOMEN AND PELVIS WITHOUT CONTRAST  TECHNIQUE:  Multidetector CT imaging of the abdomen and pelvis was performed following the standard protocol without IV contrast.  COMPARISON:  CT abdomen and pelvis 11/18/2013.  FINDINGS: The patient has small bilateral pleural effusions. Basilar airspace disease is more notable on the left. Heart size is mildly enlarged. No pericardial effusion.  The gallbladder, liver, spleen, adrenal glands, pancreas and right kidney appear normal. A nonobstructing stone lower pole the left kidney measures 0.4 cm. A small left renal cyst is identified.  There is extensive aortoiliac atherosclerosis without aneurysm. The patient is status post hysterectomy. Sigmoid diverticulosis without diverticulitis is identified. The colon is otherwise unremarkable. The stomach and small bowel appear normal. No lymphadenopathy or fluid.  The patient has marked multilevel thoracic and lumbar spondylosis. Postoperative change of L4-5 fusion is again seen. An acute or subacute right inferior pubic ramus fracture is noted. There is new patchy  sclerosis throughout the T11 vertebral body.  IMPRESSION: Small bilateral pleural effusions and basilar airspace disease, worse on the left. Airspace disease in the left lung base could be atelectasis or pneumonia. Airspace opacity on the right has an appearance most consistent with atelectasis.  New sclerosis in the T11 vertebral body may be degenerative but cannot be definitively characterized and sclerotic metastasis is within the differential. Recommend whole-body bone scan for further evaluation.  Acute or subacute right inferior pubic ramus fracture.  Sigmoid diverticulosis without diverticulitis.  0.4 cm nonobstructing stone lower pole left kidney.  Atherosclerosis.   Electronically Signed   By: Inge Rise M.D.   On: 09/10/2014 19:49    EKG:   Orders placed or performed during the hospital encounter of 09/10/14  . EKG 12-Lead  . EKG 12-Lead    IMPRESSION AND PLAN:   Jola Critzer  is a 81 y.o.  female with a known history of hypertension, hyperlipidemia, history of fall and subdural hematoma in April 2016, history of recurrent urinary tract infections, peripheral vascular disease who lives at home by herself with 24x7 caregivers was brought into the hospital secondary to worsening fever and weakness.  #1 early sepsis-secondary to recurrent urinary tract infection. -Blood and urine cultures have been ordered. -Recent urine cultures from 09/01/2014 showing enterococcus sensitive to vancomycin. -Started on vancomycin for now. Add Rocephin as well for any other gram-negatives -ID consult for recurrent UTIs. If she grows enterococcus again she might need PICC line.  #2 hypertension-continue Toprol and Norvasc.  #3 hypothyroidism-on Synthroid.  #4 peripheral neuropathy-continue Lyrica.  #5 DVT prophylaxis-on Lovenox  Physical therapy consult placed. Social worker consult for possible placement. Daughters in agreement with skilled nursing facility placement.  All the records are reviewed and case discussed with ED provider. Management plans discussed with the patient, family and they are in agreement.  CODE STATUS: DO NOT RESUSCITATE  TOTAL TIME TAKING CARE OF THIS PATIENT: 50 minutes.    Gladstone Lighter M.D on 09/10/2014 at 8:06 PM  Between 7am to 6pm - Pager - [phone]  After 6pm go to [email or web address] - password [email or web address] [facility]  Office  [phone]  CC: Primary care physician; [name], MD

## 2014-09-11 ENCOUNTER — Encounter: Payer: Self-pay | Admitting: *Deleted

## 2014-09-11 LAB — CBC
HCT: 37 % (ref 35.0–47.0)
Hemoglobin: 12.4 g/dL (ref 12.0–16.0)
MCH: 31.6 pg (ref 26.0–34.0)
MCHC: 33.5 g/dL (ref 32.0–36.0)
MCV: 94.3 fL (ref 80.0–100.0)
PLATELETS: 197 10*3/uL (ref 150–440)
RBC: 3.92 MIL/uL (ref 3.80–5.20)
RDW: 13.1 % (ref 11.5–14.5)
WBC: 6 10*3/uL (ref 3.6–11.0)

## 2014-09-11 LAB — BASIC METABOLIC PANEL
ANION GAP: 7 (ref 5–15)
BUN: 11 mg/dL (ref 6–20)
CALCIUM: 8.8 mg/dL — AB (ref 8.9–10.3)
CO2: 25 mmol/L (ref 22–32)
CREATININE: 0.52 mg/dL (ref 0.44–1.00)
Chloride: 110 mmol/L (ref 101–111)
Glucose, Bld: 88 mg/dL (ref 65–99)
Potassium: 3.7 mmol/L (ref 3.5–5.1)
SODIUM: 142 mmol/L (ref 135–145)

## 2014-09-11 LAB — CULTURE, URINE COMPREHENSIVE

## 2014-09-11 NOTE — Evaluation (Signed)
Physical Therapy Evaluation Patient Details Name: Krista Ellis MRN: [id number] DOB: 1930/10/22 Today's Date: 09/11/2014   History of Present Illness  Pt is a pleasant female who was admitted for sepsis. Pt with subacute pubic ramus fracture 4 weeks ago, however received no surgery for repair. Pt with history of falls, subdural hematoma and has 24/7 caregivers at home.   Clinical Impression  Pt is a pleasant 81 year old female who was admitted for sepsis. Pt performs bed mobility with mod assist and transfers/ambulation with min assist using rw. Pt demonstrates deficits with strength/mobility/endurance. Would benefit from skilled PT to address above deficits and promote optimal return to PLOF. Recommend transition to STR upon discharge from acute hospitalization.       Follow Up Recommendations SNF    Equipment Recommendations       Recommendations for Other Services       Precautions / Restrictions Precautions Precautions: Fall Restrictions Weight Bearing Restrictions: No      Mobility  Bed Mobility Overal bed mobility: Needs Assistance Bed Mobility: Supine to Sit     Supine to sit: Mod assist     General bed mobility comments: supine->sit with mod assist for scooting pt hips to EOB. Once sitting at EOB, pt able to sit with supervision. Heavy cues required for sequencing  Transfers Overall transfer level: Needs assistance Equipment used: Rolling walker (2 wheeled)             General transfer comment: sit<>Stand with rw and min assist. Pt very fearful of falling and requires cues for sequencing.  Ambulation/Gait Ambulation/Gait assistance: Min assist Ambulation Distance (Feet): 3 Feet Assistive device: Rolling walker (2 wheeled) Gait Pattern/deviations: Step-to pattern     General Gait Details: ambulated with rw and short step to gait pattern. Pt very fearful of falling. Cues given for sequencing  Stairs            Wheelchair Mobility     Modified Rankin (Stroke Patients Only)       Balance Overall balance assessment: History of Falls                                           Pertinent Vitals/Pain Pain Assessment: No/denies pain    Home Living Family/patient expects to be discharged to:: Private residence Living Arrangements: Alone Available Help at Discharge: Available 24 hours/day Type of Home: House Home Access: Stairs to enter Entrance Stairs-Rails: Psychiatric nurse of Steps: 4 Home Layout: One level   Additional Comments: caregivers sit pt in [facility] and carry her up the steps to enter the house    Prior Function Level of Independence: Needs assistance   Gait / Transfers Assistance Needed: RW for gait  ADL's / Homemaking Assistance Needed: Assist to get out of bed, bath, dress.        Hand Dominance        Extremity/Trunk Assessment   Upper Extremity Assessment: Generalized weakness (grossly 4/5)           Lower Extremity Assessment: Generalized weakness (grossly 3/5; R side weaker compared to L side)         Communication   Communication: No difficulties  Cognition Arousal/Alertness: Awake/alert Behavior During Therapy: WFL for tasks assessed/performed Overall Cognitive Status: Within Functional Limits for tasks assessed  General Comments      Exercises        Assessment/Plan    PT Assessment Patient needs continued PT services  PT Diagnosis Difficulty walking;Abnormality of gait   PT Problem List Decreased strength;Decreased activity tolerance;Decreased knowledge of use of DME;Decreased mobility  PT Treatment Interventions Gait training;Functional mobility training   PT Goals (Current goals can be found in the Care Plan section) Acute Rehab PT Goals Patient Stated Goal: to get stronger PT Goal Formulation: With patient Time For Goal Achievement: 09/25/14 Potential to Achieve Goals: Good    Frequency Min  2X/week   Barriers to discharge        Co-evaluation               End of Session Equipment Utilized During Treatment: Gait belt Activity Tolerance: Patient tolerated treatment well Patient left: in chair;with chair alarm set           Time: 6433-2951 PT Time Calculation (min) (ACUTE ONLY): 24 min   Charges:   PT Evaluation $Initial PT Evaluation Tier I: 1 Procedure     PT G Codes:        Rhegan Trunnell 09/24/2014, 12:11 PM  Greggory Stallion, PT, DPT [phone]

## 2014-09-11 NOTE — Telephone Encounter (Signed)
-----   Message from Nori Riis, PA-C sent at 09/11/2014  8:43 AM EDT ----- Patient's urine culture is negative.

## 2014-09-11 NOTE — Telephone Encounter (Signed)
Spoke with Ronald Pippins, pt daughter, in reference to negative urine cx. Ronald Pippins stated that pt was admitted to [facility] last night with a 102.9 fever. Per Ronald Pippins admitting doctor dx her with a UTI and pneumonia.

## 2014-09-11 NOTE — Care Management (Signed)
Patient is being followed for Long term placement by CSW. No CM needs.

## 2014-09-11 NOTE — Consult Note (Signed)
[facility] Infectious Disease     Reason for Consult Sepsis    Referring Physician:Kalisetta, Radhika Date of Admission:  09/10/2014   Active Problems:   Sepsis  HPI: ZANNA HAWN is a 81 y.o. female with hx of recurrent UTIs admitted with fevers and weakness. Had been following with urology for UTI with enterococcus- started on macrobid (has a pcn allergy) but developed fevers to 102. Also diff breathing and nausea.   Past Medical History  Diagnosis Date  . Hypertension   . Hyperlipidemia   . Arthritis   . Hemihypertrophy   . Myocardial infarction   . Thyroid disease   . CVA (cerebral infarction) 2003  . Peripheral vascular disease     s/p stent right leg  . Pulmonary nodule     stable on Chest CT March 2014, Followed at [facility]  . Murmur, cardiac   . Chronic cystitis   . Hematuria   . Bleeding disorder   . Recurrent UTI   . Gross hematuria   . Urinary frequency   . Dysuria   . Sensory urge incontinence   . History of kidney stones    Past Surgical History  Procedure Laterality Date  . Abdominal hysterectomy  1982  . Total hip arthroplasty      right  . Spine surgery      steel rod in back  . Tonsillectomy    . Left rotator cuff      repair  . Total knee arthroplasty Left   . Hip surgery  2007    replaced ball in right hip  . Cardiac surgery      2 failed stents in right leg  . Stent placement leg Right 2015  . Carpal tunnel release    . Appendectomy    . Ruptured disc     Social History  Substance Use Topics  . Smoking status: Never Smoker   . Smokeless tobacco: Never Used  . Alcohol Use: No   Family History  Problem Relation Age of Onset  . Hypertension Mother   . Transient ischemic attack Mother   . Kidney disease Neg Hx   . Bladder Cancer Neg Hx   . Prostate cancer Neg Hx     Allergies:  Allergies  Allergen Reactions  . Sulfa Antibiotics Hives and Itching  . Ciprofloxacin Hives and Itching  . Doxycycline Hives and Itching  .  Ferrous Sulfate Hives and Itching  . Levofloxacin Hives and Itching  . Penicillins Hives and Itching    Current antibiotics: Antibiotics Given (last 72 hours)    Date/Time Action Medication Dose Rate   09/10/14 2235 Given   vancomycin (VANCOCIN) IVPB 750 mg/150 ml premix 750 mg 150 mL/hr   09/11/14 1200 Given   vancomycin (VANCOCIN) IVPB 750 mg/150 ml premix 750 mg 150 mL/hr      MEDICATIONS: . amLODipine  5 mg Oral Daily  . enoxaparin (LOVENOX) injection  40 mg Subcutaneous Q24H  . feeding supplement (ENSURE ENLIVE)  237 mL Oral BID PC  . levothyroxine  25 mcg Oral QAC breakfast  . metoprolol succinate  12.5 mg Oral BH-q7a  . pravastatin  40 mg Oral QPM  . pregabalin  50 mg Oral BID  . sodium chloride  3 mL Intravenous Q12H  . vancomycin  750 mg Intravenous Q24H    Review of Systems - 11 systems reviewed and negative per HPI  OBJECTIVE: Temp:  [98 F (36.7 C)-102.6 F (39.2 C)] 98.2 F (36.8 C) (08/22  0745) Pulse Rate:  [73-114] 82 (08/22 1000) Resp:  [17-22] 17 (08/22 0745) BP: (101-149)/(41-111) 142/64 mmHg (08/22 1000) SpO2:  [90 %-100 %] 96 % (08/22 0745) Weight:  [51.71 kg (114 lb)] 51.71 kg (114 lb) (08/21 1650) Physical Exam  Constitutional:  oriented to person, place, and time. appears well-developed and well-nourished. No distress.  HENT: [place]/AT, PERRLA, no scleral icterus Mouth/Throat: Oropharynx is clear and moist. No oropharyngeal exudate.  Cardiovascular: Normal rate, regular rhythm and normal heart sounds. Exam reveals no gallop and no friction rub.  No murmur heard.  Pulmonary/Chest: Effort normal and breath sounds normal. No respiratory distress.  has no wheezes.  Neck = supple, no nuchal rigidity Abdominal: Soft. Bowel sounds are normal.  exhibits no distension. There is no tenderness.  Lymphadenopathy: no cervical adenopathy. No axillary adenopathy Neurological: alert and oriented to person, place, and time.  Skin: Skin is warm and dry. No rash  noted. No erythema.  Psychiatric: a normal mood and affect.  behavior is normal.    LABS: Results for orders placed or performed during the hospital encounter of 09/10/14 (from the past 48 hour(s))  Culture, blood (routine x 2)     Status: None (Preliminary result)   Collection Time: 09/10/14  5:00 PM  Result Value Ref Range   Specimen Description BLOOD LEFT ARM    Special Requests      BOTTLES DRAWN AEROBIC AND ANAEROBIC  AER 5CC ANA 3CC   Culture NO GROWTH < 24 HOURS    Report Status PENDING   Comprehensive metabolic panel     Status: Abnormal   Collection Time: 09/10/14  5:09 PM  Result Value Ref Range   Sodium 137 135 - 145 mmol/L   Potassium 4.1 3.5 - 5.1 mmol/L   Chloride 104 101 - 111 mmol/L   CO2 26 22 - 32 mmol/L   Glucose, Bld 118 (H) 65 - 99 mg/dL   BUN 17 6 - 20 mg/dL   Creatinine, Ser 0.65 0.44 - 1.00 mg/dL   Calcium 9.2 8.9 - 10.3 mg/dL   Total Protein 6.5 6.5 - 8.1 g/dL   Albumin 3.2 (L) 3.5 - 5.0 g/dL   AST 30 15 - 41 U/L   ALT 44 14 - 54 U/L   Alkaline Phosphatase 132 (H) 38 - 126 U/L   Total Bilirubin 0.4 0.3 - 1.2 mg/dL   GFR calc non Af Amer >60 >60 mL/min   GFR calc Af Amer >60 >60 mL/min    Comment: (NOTE) The eGFR has been calculated using the CKD EPI equation. This calculation has not been validated in all clinical situations. eGFR's persistently <60 mL/min signify possible Chronic Kidney Disease.    Anion gap 7 5 - 15  Lactic acid, plasma     Status: None   Collection Time: 09/10/14  5:09 PM  Result Value Ref Range   Lactic Acid, Venous 1.1 0.5 - 2.0 mmol/L  CBC     Status: None   Collection Time: 09/10/14  5:09 PM  Result Value Ref Range   WBC 10.7 3.6 - 11.0 K/uL   RBC 4.15 3.80 - 5.20 MIL/uL   Hemoglobin 12.8 12.0 - 16.0 g/dL   HCT 38.9 35.0 - 47.0 %   MCV 93.6 80.0 - 100.0 fL   MCH 30.9 26.0 - 34.0 pg   MCHC 33.0 32.0 - 36.0 g/dL   RDW 13.3 11.5 - 14.5 %   Platelets 220 150 - 440 K/uL  Lipase, blood  Status: None   Collection  Time: 09/10/14  5:09 PM  Result Value Ref Range   Lipase 28 22 - 51 U/L  Troponin I     Status: None   Collection Time: 09/10/14  5:09 PM  Result Value Ref Range   Troponin I <0.03 <0.031 ng/mL    Comment:        NO INDICATION OF MYOCARDIAL INJURY.   APTT     Status: None   Collection Time: 09/10/14  5:09 PM  Result Value Ref Range   aPTT 29 24 - 36 seconds  Protime-INR     Status: None   Collection Time: 09/10/14  5:09 PM  Result Value Ref Range   Prothrombin Time 13.3 11.4 - 15.0 seconds   INR 0.99   Culture, blood (routine x 2)     Status: None (Preliminary result)   Collection Time: 09/10/14  5:14 PM  Result Value Ref Range   Specimen Description BLOOD RIGHT ARM    Special Requests      BOTTLES DRAWN AEROBIC AND ANAEROBIC  AER 5CC ANA Icard   Culture NO GROWTH < 24 HOURS    Report Status PENDING   Urine culture     Status: None (Preliminary result)   Collection Time: 09/10/14  5:31 PM  Result Value Ref Range   Specimen Description URINE, RANDOM    Special Requests NONE    Culture TOO YOUNG TO READ    Report Status PENDING   Urinalysis complete, with microscopic ([facility] only)     Status: Abnormal   Collection Time: 09/10/14  5:31 PM  Result Value Ref Range   Color, Urine YELLOW (A) YELLOW   APPearance HAZY (A) CLEAR   Glucose, UA NEGATIVE NEGATIVE mg/dL   Bilirubin Urine NEGATIVE NEGATIVE   Ketones, ur NEGATIVE NEGATIVE mg/dL   Specific Gravity, Urine 1.013 1.005 - 1.030   Hgb urine dipstick 2+ (A) NEGATIVE   pH 6.0 5.0 - 8.0   Protein, ur 30 (A) NEGATIVE mg/dL   Nitrite NEGATIVE NEGATIVE   Leukocytes, UA 3+ (A) NEGATIVE   RBC / HPF 6-30 0 - 5 RBC/hpf   WBC, UA TOO NUMEROUS TO COUNT 0 - 5 WBC/hpf   Bacteria, UA MANY (A) NONE SEEN   Squamous Epithelial / LPF 0-5 (A) NONE SEEN   WBC Clumps PRESENT    Mucous PRESENT   Lactic acid, plasma     Status: None   Collection Time: 09/10/14  7:55 PM  Result Value Ref Range   Lactic Acid, Venous 0.9 0.5 - 2.0 mmol/L   Basic metabolic panel     Status: Abnormal   Collection Time: 09/11/14  6:01 AM  Result Value Ref Range   Sodium 142 135 - 145 mmol/L   Potassium 3.7 3.5 - 5.1 mmol/L   Chloride 110 101 - 111 mmol/L   CO2 25 22 - 32 mmol/L   Glucose, Bld 88 65 - 99 mg/dL   BUN 11 6 - 20 mg/dL   Creatinine, Ser 0.52 0.44 - 1.00 mg/dL   Calcium 8.8 (L) 8.9 - 10.3 mg/dL   GFR calc non Af Amer >60 >60 mL/min   GFR calc Af Amer >60 >60 mL/min    Comment: (NOTE) The eGFR has been calculated using the CKD EPI equation. This calculation has not been validated in all clinical situations. eGFR's persistently <60 mL/min signify possible Chronic Kidney Disease.    Anion gap 7 5 - 15  CBC  Status: None   Collection Time: 09/11/14  6:01 AM  Result Value Ref Range   WBC 6.0 3.6 - 11.0 K/uL   RBC 3.92 3.80 - 5.20 MIL/uL   Hemoglobin 12.4 12.0 - 16.0 g/dL   HCT 37.0 35.0 - 47.0 %   MCV 94.3 80.0 - 100.0 fL   MCH 31.6 26.0 - 34.0 pg   MCHC 33.5 32.0 - 36.0 g/dL   RDW 13.1 11.5 - 14.5 %   Platelets 197 150 - 440 K/uL   No components found for: ESR, C REACTIVE PROTEIN MICRO: Recent Results (from the past 720 hour(s))  Microscopic Examination     Status: Abnormal   Collection Time: 09/01/14  4:24 PM  Result Value Ref Range Status   WBC, UA >30 (H) 0 -  5 /hpf Final   RBC, UA >30 (H) 0 -  2 /hpf Final   Epithelial Cells (non renal) None seen 0 - 10 /hpf Final   Crystals Present (A) N/A Final   Crystal Type Amorphous Sediment N/A Final   Bacteria, UA Many (A) None seen/Few Final  CULTURE, URINE COMPREHENSIVE     Status: Abnormal   Collection Time: 09/01/14  4:25 PM  Result Value Ref Range Status   Urine Culture, Comprehensive Final report (A)  Final   Result 1 Enterococcus species (A)  Final    Comment: Greater than 100,000 colony forming units per mL Note: this isolate is vancomycin-susceptible. This information is provided for epidemiologic purposes only: vancomycin is not among the antibiotics  recommended for therapy of urinary tract infections caused by Enterococcus.    Result 2 Comment  Final    Comment: Mixed urogenital flora 10,000-25,000 colony forming units per mL    ANTIMICROBIAL SUSCEPTIBILITY Comment  Final    Comment:       ** S = Susceptible; I = Intermediate; R = Resistant **                    P = Positive; N = Negative             MICS are expressed in micrograms per mL    Antibiotic                 RSLT#1    RSLT#2    RSLT#3    RSLT#4 Ciprofloxacin                  R Levofloxacin                   R Nitrofurantoin                 S Penicillin                     S Tetracycline                   R Vancomycin                     S   CULTURE, URINE COMPREHENSIVE     Status: None   Collection Time: 09/08/14  8:43 AM  Result Value Ref Range Status   Urine Culture, Comprehensive Final report  Final   Result 1 Comment  Final    Comment: Mixed urogenital flora Greater than 100,000 colony forming units per mL   Microscopic Examination     Status: Abnormal   Collection Time: 09/08/14  8:43 AM  Result Value Ref  Range Status   WBC, UA >30 (H) 0 -  5 /hpf Final   RBC, UA >30 (H) 0 -  2 /hpf Final   Epithelial Cells (non renal) None seen 0 - 10 /hpf Final   Bacteria, UA Many (A) None seen/Few Final  Culture, blood (routine x 2)     Status: None (Preliminary result)   Collection Time: 09/10/14  5:00 PM  Result Value Ref Range Status   Specimen Description BLOOD LEFT ARM  Final   Special Requests   Final    BOTTLES DRAWN AEROBIC AND ANAEROBIC  AER 5CC ANA 3CC   Culture NO GROWTH < 24 HOURS  Final   Report Status PENDING  Incomplete  Culture, blood (routine x 2)     Status: None (Preliminary result)   Collection Time: 09/10/14  5:14 PM  Result Value Ref Range Status   Specimen Description BLOOD RIGHT ARM  Final   Special Requests   Final    BOTTLES DRAWN AEROBIC AND ANAEROBIC  AER 5CC ANA [place]   Culture NO GROWTH < 24 HOURS  Final   Report Status PENDING   Incomplete  Urine culture     Status: None (Preliminary result)   Collection Time: 09/10/14  5:31 PM  Result Value Ref Range Status   Specimen Description URINE, RANDOM  Final   Special Requests NONE  Final   Culture TOO YOUNG TO READ  Final   Report Status PENDING  Incomplete    IMAGING: Ct Head Wo Contrast  08/18/2014   CLINICAL DATA:  Status post fall, unable to bear weight  EXAM: CT HEAD WITHOUT CONTRAST  TECHNIQUE: Contiguous axial images were obtained from the base of the skull through the vertex without intravenous contrast.  COMPARISON:  MR brain 05/18/2014  FINDINGS: There is no evidence of mass effect, midline shift, or extra-axial fluid collections. There is no evidence of a space-occupying lesion or intracranial hemorrhage. There is no evidence of a cortical-based area of acute infarction. There is an old left base ganglia lacunar infarct. There is generalized cerebral atrophy. There is periventricular white matter low attenuation likely secondary to microangiopathy.  The ventricles and sulci are appropriate for the patient's age. The basal cisterns are patent.  Visualized portions of the orbits are unremarkable. The visualized portions of the paranasal sinuses and mastoid air cells are unremarkable. Cerebrovascular atherosclerotic calcifications are noted.  The osseous structures are unremarkable.  IMPRESSION: 1. No acute intracranial pathology.   Electronically Signed   By: Kathreen Devoid   On: 08/18/2014 15:27   Dg Chest Port 1 View  09/10/2014   CLINICAL DATA:  Pt from home via EMS c/o weakness since last Wednesday, recent dx of UTI currently on abx, also recent pelvic fracture without surgery. Reports temp 100.3 PTA> Pt has had profound weakness and SOB since yesterday.  EXAM: PORTABLE CHEST - 1 VIEW  COMPARISON:  05/09/2010.  Chest CT, 09/25/2013.  FINDINGS: There is reticular and hazy opacity at the lung bases, left greater than right, increased the prior exam. This most likely  atelectasis. Pneumonia, particular at the left base common is possible.  Mild prominence of the interstitial markings is noted bilaterally. There is no convincing pulmonary edema. No pneumothorax.  Cardiac silhouette is normal in size and configuration. No mediastinal or hilar masses or evidence of adenopathy.  Bony thorax is demineralized. There arthropathic changes of the left shoulder.  IMPRESSION: 1. Left greater than right lung base opacity, which may all be due  to atelectasis. Pneumonia is possible. No evidence of pulmonary edema.   Electronically Signed   By: Lajean Manes M.D.   On: 09/10/2014 17:32   Ct Renal Stone Study  09/10/2014   CLINICAL DATA:  Left lower quadrant pain for 5 days. Recent diagnosis of urinary tract infection. Recent pelvic fracture.  EXAM: CT ABDOMEN AND PELVIS WITHOUT CONTRAST  TECHNIQUE: Multidetector CT imaging of the abdomen and pelvis was performed following the standard protocol without IV contrast.  COMPARISON:  CT abdomen and pelvis 11/18/2013.  FINDINGS: The patient has small bilateral pleural effusions. Basilar airspace disease is more notable on the left. Heart size is mildly enlarged. No pericardial effusion.  The gallbladder, liver, spleen, adrenal glands, pancreas and right kidney appear normal. A nonobstructing stone lower pole the left kidney measures 0.4 cm. A small left renal cyst is identified.  There is extensive aortoiliac atherosclerosis without aneurysm. The patient is status post hysterectomy. Sigmoid diverticulosis without diverticulitis is identified. The colon is otherwise unremarkable. The stomach and small bowel appear normal. No lymphadenopathy or fluid.  The patient has marked multilevel thoracic and lumbar spondylosis. Postoperative change of L4-5 fusion is again seen. An acute or subacute right inferior pubic ramus fracture is noted. There is new patchy sclerosis throughout the T11 vertebral body.  IMPRESSION: Small bilateral pleural effusions and  basilar airspace disease, worse on the left. Airspace disease in the left lung base could be atelectasis or pneumonia. Airspace opacity on the right has an appearance most consistent with atelectasis.  New sclerosis in the T11 vertebral body may be degenerative but cannot be definitively characterized and sclerotic metastasis is within the differential. Recommend whole-body bone scan for further evaluation.  Acute or subacute right inferior pubic ramus fracture.  Sigmoid diverticulosis without diverticulitis.  0.4 cm nonobstructing stone lower pole left kidney.  Atherosclerosis.   Electronically Signed   By: Inge Rise M.D.   On: 09/10/2014 19:49   Dg Hip Unilat With Pelvis 2-3 Views Right  08/18/2014   CLINICAL DATA:  Right hip pain, bruising  EXAM: DG HIP (WITH OR WITHOUT PELVIS) 2-3V RIGHT  COMPARISON:  None.  FINDINGS: Right total hip arthroplasty without failure or complication. Nondisplaced fracture of the right inferior pubic ramus. No other fracture or dislocation. Generalized osteopenia. Posterior lumbar fusion at L4-5.  IMPRESSION: 1. Nondisplaced fracture of the right inferior pubic ramus.   Electronically Signed   By: Kathreen Devoid   On: 08/18/2014 15:29    Assessment:   ORETHA WEISMANN is a 81 y.o. female with hx of recurrent UTIs admitted with fevers and weakness. Had been following with urology for UTI with enterococcus- started on macrobid (has a pcn allergy) but developed fevers to 102. Also diff breathing and nausea.  UA showed TNTC wbc and WBC clumps. CT shows a small stone. BCX neg, Prue pending from admit but 8/12 grew enterococcus > 100 K sensitive to macrobid, pcn and vanco.  She has seen urology and is on estrogen cream and cranberry but has continued to have recurrent infections. Has not recently been on suppressive daily abx. Given true pcn allergy with hives and rash will need IV vancomcyin  Recommendations Place Picc Vanco for 10 days Will start macrobid after that for  suppresssion  Thank you very much for allowing me to participate in the care of this patient. Please call with questions.   Cheral Marker. Ola Spurr, MD

## 2014-09-11 NOTE — Progress Notes (Signed)
[place] [facility] [place] NAME: Krista Ellis    MR#:  [id number]  DATE OF BIRTH:  06-07-1930  SUBJECTIVE: Admitted for fever, weakness. Found to have UTI. Seen today patient is alert oriented denies any complaints.   CHIEF COMPLAINT:   Chief Complaint  Patient presents with  . Weakness  . Code Sepsis    REVIEW OF SYSTEMS:   ROS CONSTITUTIONAL: No fever, fatigue or weakness.  EYES: No blurred or double vision.  EARS, NOSE, AND THROAT: No tinnitus or ear pain.  RESPIRATORY: No cough, shortness of breath, wheezing or hemoptysis.  CARDIOVASCULAR: No chest pain, orthopnea, edema.  GASTROINTESTINAL: No nausea, vomiting, diarrhea or abdominal pain.  GENITOURINARY: No dysuria, hematuria.  ENDOCRINE: No polyuria, nocturia,  HEMATOLOGY: No anemia, easy bruising or bleeding SKIN: No rash or lesion. MUSCULOSKELETAL: No joint pain or arthritis.   NEUROLOGIC: No tingling, numbness, weakness.  PSYCHIATRY: No anxiety or depression.   DRUG ALLERGIES:   Allergies  Allergen Reactions  . Sulfa Antibiotics Hives and Itching  . Ciprofloxacin Hives and Itching  . Doxycycline Hives and Itching  . Ferrous Sulfate Hives and Itching  . Levofloxacin Hives and Itching  . Penicillins Hives and Itching    VITALS:  Blood pressure 142/64, pulse 82, temperature 98.2 F (36.8 C), temperature source Oral, resp. rate 17, height 5\' 2"  (1.575 m), weight 51.71 kg (114 lb), SpO2 96 %.  PHYSICAL EXAMINATION:  GENERAL:  81 y.o.-year-old patient lying in the bed with no acute distress.  EYES: Pupils equal, round, reactive to light and accommodation. No scleral icterus. Extraocular muscles intact.  HEENT: Head atraumatic, normocephalic. Oropharynx and nasopharynx clear.  NECK:  Supple, no jugular venous distention. No thyroid enlargement, no tenderness.  LUNGS: Normal breath sounds bilaterally, no wheezing, rales,rhonchi or crepitation. No use of accessory  muscles of respiration.  CARDIOVASCULAR: S1, S2 normal. No murmurs, rubs, or gallops.  ABDOMEN: Soft, nontender, nondistended. Bowel sounds present. No organomegaly or mass.  EXTREMITIES: No pedal edema, cyanosis, or clubbing.  NEUROLOGIC: Cranial nerves II through XII are intact. Muscle strength 5/5 in all extremities. Sensation intact. Gait not checked.  PSYCHIATRIC: The patient is alert and oriented x 3.  SKIN: No obvious rash, lesion, or ulcer.    LABORATORY PANEL:   CBC  Recent Labs Lab 09/11/14 0601  WBC 6.0  HGB 12.4  HCT 37.0  PLT 197   ------------------------------------------------------------------------------------------------------------------  Chemistries   Recent Labs Lab 09/10/14 1709 09/11/14 0601  NA 137 142  K 4.1 3.7  CL 104 110  CO2 26 25  GLUCOSE 118* 88  BUN 17 11  CREATININE 0.65 0.52  CALCIUM 9.2 8.8*  AST 30  --   ALT 44  --   ALKPHOS 132*  --   BILITOT 0.4  --    ------------------------------------------------------------------------------------------------------------------  Cardiac Enzymes  Recent Labs Lab 09/10/14 1709  TROPONINI <0.03   ------------------------------------------------------------------------------------------------------------------  RADIOLOGY:  Dg Chest Port 1 View  09/10/2014   CLINICAL DATA:  Pt from home via EMS c/o weakness since last Wednesday, recent dx of UTI currently on abx, also recent pelvic fracture without surgery. Reports temp 100.3 PTA> Pt has had profound weakness and SOB since yesterday.  EXAM: PORTABLE CHEST - 1 VIEW  COMPARISON:  05/09/2010.  Chest CT, 09/25/2013.  FINDINGS: There is reticular and hazy opacity at the lung bases, left greater than right, increased the prior exam. This most likely atelectasis. Pneumonia, particular at the left base common  is possible.  Mild prominence of the interstitial markings is noted bilaterally. There is no convincing pulmonary edema. No pneumothorax.   Cardiac silhouette is normal in size and configuration. No mediastinal or hilar masses or evidence of adenopathy.  Bony thorax is demineralized. There arthropathic changes of the left shoulder.  IMPRESSION: 1. Left greater than right lung base opacity, which may all be due to atelectasis. Pneumonia is possible. No evidence of pulmonary edema.   Electronically Signed   By: Lajean Manes M.D.   On: 09/10/2014 17:32   Ct Renal Stone Study  09/10/2014   CLINICAL DATA:  Left lower quadrant pain for 5 days. Recent diagnosis of urinary tract infection. Recent pelvic fracture.  EXAM: CT ABDOMEN AND PELVIS WITHOUT CONTRAST  TECHNIQUE: Multidetector CT imaging of the abdomen and pelvis was performed following the standard protocol without IV contrast.  COMPARISON:  CT abdomen and pelvis 11/18/2013.  FINDINGS: The patient has small bilateral pleural effusions. Basilar airspace disease is more notable on the left. Heart size is mildly enlarged. No pericardial effusion.  The gallbladder, liver, spleen, adrenal glands, pancreas and right kidney appear normal. A nonobstructing stone lower pole the left kidney measures 0.4 cm. A small left renal cyst is identified.  There is extensive aortoiliac atherosclerosis without aneurysm. The patient is status post hysterectomy. Sigmoid diverticulosis without diverticulitis is identified. The colon is otherwise unremarkable. The stomach and small bowel appear normal. No lymphadenopathy or fluid.  The patient has marked multilevel thoracic and lumbar spondylosis. Postoperative change of L4-5 fusion is again seen. An acute or subacute right inferior pubic ramus fracture is noted. There is new patchy sclerosis throughout the T11 vertebral body.  IMPRESSION: Small bilateral pleural effusions and basilar airspace disease, worse on the left. Airspace disease in the left lung base could be atelectasis or pneumonia. Airspace opacity on the right has an appearance most consistent with atelectasis.   New sclerosis in the T11 vertebral body may be degenerative but cannot be definitively characterized and sclerotic metastasis is within the differential. Recommend whole-body bone scan for further evaluation.  Acute or subacute right inferior pubic ramus fracture.  Sigmoid diverticulosis without diverticulitis.  0.4 cm nonobstructing stone lower pole left kidney.  Atherosclerosis.   Electronically Signed   By: Inge Rise M.D.   On: 09/10/2014 19:49    EKG:   Orders placed or performed during the hospital encounter of 09/10/14  . EKG 12-Lead  . EKG 12-Lead  . EKG 12-Lead  . EKG 12-Lead    ASSESSMENT AND PLAN:  #1 sepsis secondary to UTI: Continue to give IV vancomycin, check urine cultures. ID consult is placed follow his recommendations. H/o recurrent UTI. #2 history of hypertension controlled continue Toprol, Norvasc. #3 hypothyroidism continue Synthroid,. #4 history of peripheral neuropathy continue Lyrica   history of subdural hematoma in April 2016 .  DNR    All the records are reviewed and case discussed with Care Management/Social Workerr. Management plans discussed with the patient, family and they are in agreement.  CODE STATUS: DNR  TOTAL TIME TAKING CARE OF THIS PATIENT:35  minutes.   POSSIBLE D/C IN 1-2DAYS, DEPENDING ON CLINICAL CONDITION.   Epifanio Lesches M.D on 09/11/2014 at 11:46 AM  Between 7am to 6pm - Pager - [phone]  After 6pm go to [email or web address] - password [email or web address] [facility]  Office  [phone]  CC: Primary care physician; [name], MD

## 2014-09-12 ENCOUNTER — Inpatient Hospital Stay: Payer: Medicare Other

## 2014-09-12 LAB — URINE CULTURE

## 2014-09-12 LAB — CK: CK TOTAL: 16 U/L — AB (ref 38–234)

## 2014-09-12 LAB — TROPONIN I

## 2014-09-12 MED ORDER — NITROGLYCERIN 0.4 MG SL SUBL
0.4000 mg | SUBLINGUAL_TABLET | SUBLINGUAL | Status: DC | PRN
  Administered 2014-09-12: 0.4 mg via SUBLINGUAL
  Filled 2014-09-12: qty 1

## 2014-09-12 MED ORDER — SODIUM CHLORIDE 0.9 % IJ SOLN
10.0000 mL | Freq: Two times a day (BID) | INTRAMUSCULAR | Status: DC
  Administered 2014-09-12 – 2014-09-14 (×3): 10 mL via INTRAVENOUS

## 2014-09-12 NOTE — Progress Notes (Signed)
Infectious Disease Long Term IV Antibiotic Orders  Diagnosis - urosepsis Pcn allergy.  Culture results Enterococcus  Anti-infectives    Start     Dose/Rate Route Frequency Ordered Stop   09/11/14 1100  vancomycin (VANCOCIN) IVPB 750 mg/150 ml premix     750 mg 150 mL/hr over 60 Minutes Intravenous Every 24 hours 09/10/14 2204     09/10/14 2300  vancomycin (VANCOCIN) IVPB 750 mg/150 ml premix     750 mg 150 mL/hr over 60 Minutes Intravenous  Once 09/10/14 2204 09/10/14 2335   09/10/14 1730  cefTAZidime (FORTAZ) 2 g in dextrose 5 % 50 mL IVPB     2 g 100 mL/hr over 30 Minutes Intravenous  Once 09/10/14 1730 09/10/14 1952   09/10/14 1715  cefTAZidime (FORTAZ) 2 g in dextrose 5 % 50 mL IVPB  Status:  Discontinued     2 g 100 mL/hr over 30 Minutes Intravenous 3 times per day 09/10/14 1701 09/10/14 2137     Allergies:  Allergies  Allergen Reactions  . Sulfa Antibiotics Hives and Itching  . Ciprofloxacin Hives and Itching  . Doxycycline Hives and Itching  . Ferrous Sulfate Hives and Itching  . Levofloxacin Hives and Itching  . Penicillins Hives and Itching    Discharge antibiotics  Vancomycin       750 mg  every   24   hours .     Goal vancomycin trough 15-20.    Pharmacy to adjust dosing based on levels PICC Care per protocol Labs weekly while on IV antibiotics (circle)      CBC w diff   Comprehensive met panel Vancomycin Trough    Stop date   09/21/14 Follow up clinic date TBD FAX weekly labs to [phone]  Leonel Ramsay, MD

## 2014-09-12 NOTE — Progress Notes (Signed)
Physical Therapy Treatment Patient Details Name: Krista Ellis MRN: [id number] DOB: June 20, 1930 Today's Date: 09/12/2014    History of Present Illness Pt is a pleasant female who was admitted for sepsis. Pt with subacute pubic ramus fracture 4 weeks ago, however received no surgery for repair. Pt with history of falls, subdural hematoma and has 24/7 caregivers at home.     PT Comments    Pt continues making progress towards goals and is motivated to participate in therapy. She has ambulated further this date with less fatigue, but she complains of weakness in legs. Pt able to perform therex for improved strength with no pain or discomfort. She will continue to benefit from skilled PT in order to return her to premorbid state.   Follow Up Recommendations  SNF     Equipment Recommendations  Rolling walker with 5" wheels    Recommendations for Other Services       Precautions / Restrictions Precautions Precautions: Fall Restrictions Weight Bearing Restrictions: No    Mobility  Bed Mobility Overal bed mobility: Needs Assistance Bed Mobility: Supine to Sit     Supine to sit: Mod assist     General bed mobility comments: Pt requires trunk and LE assist in order to get to EOB.   Transfers Overall transfer level: Needs assistance Equipment used: Rolling walker (2 wheeled) Transfers: Sit to/from Stand Sit to Stand: Min assist         General transfer comment: Pt requires trunk support assist with standing. She needs cues on hand placement prior to transfer. Pt c/o weakness during transfer  Ambulation/Gait Ambulation/Gait assistance: Min assist Ambulation Distance (Feet): 10 Feet Assistive device: Rolling walker (2 wheeled) Gait Pattern/deviations: Decreased step length - right;Decreased step length - left;Decreased stride length;Shuffle Gait velocity: decreased   General Gait Details: Pt requires assist for advancement of walker during ambulation and turns. Pt with  no c/o fatigue, just weakness   Stairs            Wheelchair Mobility    Modified Rankin (Stroke Patients Only)       Balance Overall balance assessment: History of Falls                                  Cognition Arousal/Alertness: Awake/alert Behavior During Therapy: WFL for tasks assessed/performed Overall Cognitive Status: Within Functional Limits for tasks assessed                      Exercises Other Exercises Other Exercises: Pt performed bilater LE therex x 10 reps at supervision for proper technique. Therex  performed: SLR, hip abd/add, heel slides, glute squeezes, and ankle pumps    General Comments        Pertinent Vitals/Pain Pain Assessment: No/denies pain    Home Living                      Prior Function            PT Goals (current goals can now be found in the care plan section) Acute Rehab PT Goals Patient Stated Goal: to participate in therapy  PT Goal Formulation: With patient Time For Goal Achievement: 09/25/14 Potential to Achieve Goals: Good Progress towards PT goals: Progressing toward goals    Frequency  Min 2X/week    PT Plan Current plan remains appropriate    Co-evaluation  End of Session Equipment Utilized During Treatment: Gait belt Activity Tolerance: Patient tolerated treatment well Patient left: in bed;with family/visitor present (Handed off to PICC placement team)     Time: 4827-0786 PT Time Calculation (min) (ACUTE ONLY): 26 min  Charges:                       G CodesJanyth Contes October 09, 2014, 11:42 AM Janyth Contes, SPT. [phone]

## 2014-09-12 NOTE — Progress Notes (Signed)
[facility] INFECTIOUS DISEASE PROGRESS NOTE Date of Admission:  09/10/2014     ID: SITLALY Krista Ellis is a 81 y.o. female with  UTI  Active Problems:   Sepsis   Subjective: Had some chest pain today. No fevers, no dysuria. picc placed. Up and walking with PT  ROS  Eleven systems are reviewed and negative except per hpi  Medications:  Antibiotics Given (last 72 hours)    Date/Time Action Medication Dose Rate   09/10/14 2235 Given   vancomycin (VANCOCIN) IVPB 750 mg/150 ml premix 750 mg 150 mL/hr   09/11/14 1200 Given   vancomycin (VANCOCIN) IVPB 750 mg/150 ml premix 750 mg 150 mL/hr   09/12/14 1149 Given   vancomycin (VANCOCIN) IVPB 750 mg/150 ml premix 750 mg 150 mL/hr     . amLODipine  5 mg Oral Daily  . enoxaparin (LOVENOX) injection  40 mg Subcutaneous Q24H  . feeding supplement (ENSURE ENLIVE)  237 mL Oral BID PC  . levothyroxine  25 mcg Oral QAC breakfast  . metoprolol succinate  12.5 mg Oral [id number]  . pravastatin  40 mg Oral QPM  . pregabalin  50 mg Oral BID  . sodium chloride  3 mL Intravenous Q12H  . vancomycin  750 mg Intravenous Q24H    Objective: Vital signs in last 24 hours: Temp:  [98 F (36.7 C)-98.5 F (36.9 C)] 98 F (36.7 C) (08/23 0758) Pulse Rate:  [73-87] 73 (08/23 0758) Resp:  [16-20] 18 (08/23 0758) BP: (133-161)/(51-62) 142/51 mmHg (08/23 0758) SpO2:  [93 %-96 %] 93 % (08/23 0758) Constitutional: oriented to person, place, and time. appears well-developed and well-nourished. No distress.  HENT: [place]/AT, PERRLA, no scleral icterus Mouth/Throat: Oropharynx is clear and moist. No oropharyngeal exudate.  Cardiovascular: Normal rate, regular rhythm and normal heart sounds. Exam reveals no gallop and no friction rub.  No murmur heard.  Pulmonary/Chest: Effort normal and breath sounds normal. No respiratory distress. has no wheezes.  Neck = supple, no nuchal rigidity Abdominal: Soft. Bowel sounds are normal. exhibits no distension. There  is no tenderness.  Lymphadenopathy: no cervical adenopathy. No axillary adenopathy Neurological: alert and oriented to person, place, and time.  Skin: Skin is warm and dry. No rash noted. No erythema.  Psychiatric: a normal mood and affect. behavior is normal.   Lab Results  Recent Labs  09/10/14 1709 09/11/14 0601  WBC 10.7 6.0  HGB 12.8 12.4  HCT 38.9 37.0  NA 137 142  K 4.1 3.7  CL 104 110  CO2 26 25  BUN 17 11  CREATININE 0.65 0.52    Microbiology: Results for orders placed or performed during the hospital encounter of 09/10/14  Culture, blood (routine x 2)     Status: None (Preliminary result)   Collection Time: 09/10/14  5:00 PM  Result Value Ref Range Status   Specimen Description BLOOD LEFT ARM  Final   Special Requests   Final    BOTTLES DRAWN AEROBIC AND ANAEROBIC  AER 5CC ANA 3CC   Culture NO GROWTH 2 DAYS  Final   Report Status PENDING  Incomplete  Culture, blood (routine x 2)     Status: None (Preliminary result)   Collection Time: 09/10/14  5:14 PM  Result Value Ref Range Status   Specimen Description BLOOD RIGHT ARM  Final   Special Requests   Final    BOTTLES DRAWN AEROBIC AND ANAEROBIC  AER 5CC ANA [place]   Culture NO GROWTH 2 DAYS  Final  Report Status PENDING  Incomplete  Urine culture     Status: None   Collection Time: 09/10/14  5:31 PM  Result Value Ref Range Status   Specimen Description URINE, RANDOM  Final   Special Requests NONE  Final   Culture MULTIPLE SPECIES PRESENT, SUGGEST RECOLLECTION  Final   Report Status 09/12/2014 FINAL  Final    Studies/Results: Dg Chest Port 1 View  09/12/2014   CLINICAL DATA:  PICC line placement.  EXAM: PORTABLE CHEST - 1 VIEW  COMPARISON:  09/10/2014  FINDINGS: There has been interval placement of right subclavian approach PICC line with tip overlying the expected location of the cavoatrial junction. There is no evidence of pneumothorax.  Cardiac silhouette is enlarged. There is blunting of the left  costophrenic angle, which likely represents a left pleural effusion and associated subsegmental atelectasis or airspace consolidation.  Severe osteoarthritic or posttraumatic changes of the left shoulder are noted.  IMPRESSION: Status post right PICC line placement. No evidence of a pneumothorax.  Enlarged cardiac silhouette.  Possible left pleural effusion and/or left lower lobe consolidation or subsegmental atelectasis.   Electronically Signed   By: Fidela Salisbury M.D.   On: 09/12/2014 13:08   Dg Chest Port 1 View  09/10/2014   CLINICAL DATA:  Pt from home via EMS c/o weakness since last Wednesday, recent dx of UTI currently on abx, also recent pelvic fracture without surgery. Reports temp 100.3 PTA> Pt has had profound weakness and SOB since yesterday.  EXAM: PORTABLE CHEST - 1 VIEW  COMPARISON:  05/09/2010.  Chest CT, 09/25/2013.  FINDINGS: There is reticular and hazy opacity at the lung bases, left greater than right, increased the prior exam. This most likely atelectasis. Pneumonia, particular at the left base common is possible.  Mild prominence of the interstitial markings is noted bilaterally. There is no convincing pulmonary edema. No pneumothorax.  Cardiac silhouette is normal in size and configuration. No mediastinal or hilar masses or evidence of adenopathy.  Bony thorax is demineralized. There arthropathic changes of the left shoulder.  IMPRESSION: 1. Left greater than right lung base opacity, which may all be due to atelectasis. Pneumonia is possible. No evidence of pulmonary edema.   Electronically Signed   By: Lajean Manes M.D.   On: 09/10/2014 17:32   Ct Renal Stone Study  09/10/2014   CLINICAL DATA:  Left lower quadrant pain for 5 days. Recent diagnosis of urinary tract infection. Recent pelvic fracture.  EXAM: CT ABDOMEN AND PELVIS WITHOUT CONTRAST  TECHNIQUE: Multidetector CT imaging of the abdomen and pelvis was performed following the standard protocol without IV contrast.   COMPARISON:  CT abdomen and pelvis 11/18/2013.  FINDINGS: The patient has small bilateral pleural effusions. Basilar airspace disease is more notable on the left. Heart size is mildly enlarged. No pericardial effusion.  The gallbladder, liver, spleen, adrenal glands, pancreas and right kidney appear normal. A nonobstructing stone lower pole the left kidney measures 0.4 cm. A small left renal cyst is identified.  There is extensive aortoiliac atherosclerosis without aneurysm. The patient is status post hysterectomy. Sigmoid diverticulosis without diverticulitis is identified. The colon is otherwise unremarkable. The stomach and small bowel appear normal. No lymphadenopathy or fluid.  The patient has marked multilevel thoracic and lumbar spondylosis. Postoperative change of L4-5 fusion is again seen. An acute or subacute right inferior pubic ramus fracture is noted. There is new patchy sclerosis throughout the T11 vertebral body.  IMPRESSION: Small bilateral pleural effusions and basilar airspace disease,  worse on the left. Airspace disease in the left lung base could be atelectasis or pneumonia. Airspace opacity on the right has an appearance most consistent with atelectasis.  New sclerosis in the T11 vertebral body may be degenerative but cannot be definitively characterized and sclerotic metastasis is within the differential. Recommend whole-body bone scan for further evaluation.  Acute or subacute right inferior pubic ramus fracture.  Sigmoid diverticulosis without diverticulitis.  0.4 cm nonobstructing stone lower pole left kidney.  Atherosclerosis.   Electronically Signed   By: Inge Rise M.D.   On: 09/10/2014 19:49    Assessment/Plan: MAGGI HERSHKOWITZ is a 81 y.o. female with hx of recurrent UTIs admitted with fevers and weakness. Had been following with urology for UTI with enterococcus- started on macrobid (has a pcn allergy) but developed fevers to 102. Also diff breathing and nausea. UA showed  TNTC wbc and WBC clumps. CT shows a small stone. BCX neg, [place] from admit is mixed but 8/12 grew enterococcus > 100 K sensitive to macrobid, pcn and vanco. She has seen urology and is on estrogen cream and cranberry but has continued to have recurrent infections. Has not recently been on suppressive daily abx. Given true pcn allergy with hives and rash will need IV vancomcyin  Recommendations Picc in place I have placed orders for IV abx and picc care Vanco for 10 days Will start macrobid after that for suppresssion Thank you very much for the consult. Will follow with you.  [place], [place]   09/12/2014, 2:35 PM

## 2014-09-12 NOTE — Progress Notes (Signed)
[facility] [place] NAME: Krista Ellis    MR#:  [id number]  DATE OF BIRTH:  02/12/1930  SUBJECTIVE: Admitted for fever, weakness. Found to have UTI. today, just received PICC line. This morning she complained of chest pressure. EKG unremarkable. Troponins are negative. She feels really tired.   CHIEF COMPLAINT:   Chief Complaint  Patient presents with  . Weakness  . Code Sepsis    REVIEW OF SYSTEMS:   Review of Systems  Constitutional: Negative for malaise/fatigue.  Cardiovascular: Positive for chest pain.   CONSTITUTIONAL: No fever, fatigue or weakness.  EYES: No blurred or double vision.  EARS, NOSE, AND THROAT: No tinnitus or ear pain.  RESPIRATORY: No cough, shortness of breath, wheezing or hemoptysis.  CARDIOVASCULAR: No chest pain, orthopnea, edema.  GASTROINTESTINAL: No nausea, vomiting, diarrhea or abdominal pain.  GENITOURINARY: No dysuria, hematuria.  ENDOCRINE: No polyuria, nocturia,  HEMATOLOGY: No anemia, easy bruising or bleeding SKIN: No rash or lesion. MUSCULOSKELETAL: No joint pain or arthritis.   NEUROLOGIC: No tingling, numbness, weakness.  PSYCHIATRY: No anxiety or depression.   DRUG ALLERGIES:   Allergies  Allergen Reactions  . Sulfa Antibiotics Hives and Itching  . Ciprofloxacin Hives and Itching  . Doxycycline Hives and Itching  . Ferrous Sulfate Hives and Itching  . Levofloxacin Hives and Itching  . Penicillins Hives and Itching    VITALS:  Blood pressure 142/51, pulse 73, temperature 98 F (36.7 C), temperature source Oral, resp. rate 18, height 5\' 2"  (1.575 m), weight 51.71 kg (114 lb), SpO2 93 %.  PHYSICAL EXAMINATION:  GENERAL:  81 y.o.-year-old patient lying in the bed with no acute distress.  EYES: Pupils equal, round, reactive to light and accommodation. No scleral icterus. Extraocular muscles intact.  HEENT: Head atraumatic, normocephalic. Oropharynx and nasopharynx clear.   NECK:  Supple, no jugular venous distention. No thyroid enlargement, no tenderness.  LUNGS: Normal breath sounds bilaterally, no wheezing, rales,rhonchi or crepitation. No use of accessory muscles of respiration.  CARDIOVASCULAR: S1, S2 normal. No murmurs, rubs, or gallops.  ABDOMEN: Soft, nontender, nondistended. Bowel sounds present. No organomegaly or mass.  EXTREMITIES: No pedal edema, cyanosis, or clubbing.  NEUROLOGIC: Cranial nerves II through XII are intact. Muscle strength 5/5 in all extremities. Sensation intact. Gait not checked.  PSYCHIATRIC: The patient is alert and oriented x 3.  SKIN: No obvious rash, lesion, or ulcer.    LABORATORY PANEL:   CBC  Recent Labs Lab 09/11/14 0601  WBC 6.0  HGB 12.4  HCT 37.0  PLT 197   ------------------------------------------------------------------------------------------------------------------  Chemistries   Recent Labs Lab 09/10/14 1709 09/11/14 0601  NA 137 142  K 4.1 3.7  CL 104 110  CO2 26 25  GLUCOSE 118* 88  BUN 17 11  CREATININE 0.65 0.52  CALCIUM 9.2 8.8*  AST 30  --   ALT 44  --   ALKPHOS 132*  --   BILITOT 0.4  --    ------------------------------------------------------------------------------------------------------------------  Cardiac Enzymes  Recent Labs Lab 09/12/14 0733  TROPONINI <0.03   ------------------------------------------------------------------------------------------------------------------  RADIOLOGY:  Dg Chest Port 1 View  09/10/2014   CLINICAL DATA:  Pt from home via EMS c/o weakness since last Wednesday, recent dx of UTI currently on abx, also recent pelvic fracture without surgery. Reports temp 100.3 PTA> Pt has had profound weakness and SOB since yesterday.  EXAM: PORTABLE CHEST - 1 VIEW  COMPARISON:  05/09/2010.  Chest CT, 09/25/2013.  FINDINGS: There is  reticular and hazy opacity at the lung bases, left greater than right, increased the prior exam. This most likely  atelectasis. Pneumonia, particular at the left base common is possible.  Mild prominence of the interstitial markings is noted bilaterally. There is no convincing pulmonary edema. No pneumothorax.  Cardiac silhouette is normal in size and configuration. No mediastinal or hilar masses or evidence of adenopathy.  Bony thorax is demineralized. There arthropathic changes of the left shoulder.  IMPRESSION: 1. Left greater than right lung base opacity, which may all be due to atelectasis. Pneumonia is possible. No evidence of pulmonary edema.   Electronically Signed   By: Lajean Manes M.D.   On: 09/10/2014 17:32   Ct Renal Stone Study  09/10/2014   CLINICAL DATA:  Left lower quadrant pain for 5 days. Recent diagnosis of urinary tract infection. Recent pelvic fracture.  EXAM: CT ABDOMEN AND PELVIS WITHOUT CONTRAST  TECHNIQUE: Multidetector CT imaging of the abdomen and pelvis was performed following the standard protocol without IV contrast.  COMPARISON:  CT abdomen and pelvis 11/18/2013.  FINDINGS: The patient has small bilateral pleural effusions. Basilar airspace disease is more notable on the left. Heart size is mildly enlarged. No pericardial effusion.  The gallbladder, liver, spleen, adrenal glands, pancreas and right kidney appear normal. A nonobstructing stone lower pole the left kidney measures 0.4 cm. A small left renal cyst is identified.  There is extensive aortoiliac atherosclerosis without aneurysm. The patient is status post hysterectomy. Sigmoid diverticulosis without diverticulitis is identified. The colon is otherwise unremarkable. The stomach and small bowel appear normal. No lymphadenopathy or fluid.  The patient has marked multilevel thoracic and lumbar spondylosis. Postoperative change of L4-5 fusion is again seen. An acute or subacute right inferior pubic ramus fracture is noted. There is new patchy sclerosis throughout the T11 vertebral body.  IMPRESSION: Small bilateral pleural effusions and  basilar airspace disease, worse on the left. Airspace disease in the left lung base could be atelectasis or pneumonia. Airspace opacity on the right has an appearance most consistent with atelectasis.  New sclerosis in the T11 vertebral body may be degenerative but cannot be definitively characterized and sclerotic metastasis is within the differential. Recommend whole-body bone scan for further evaluation.  Acute or subacute right inferior pubic ramus fracture.  Sigmoid diverticulosis without diverticulitis.  0.4 cm nonobstructing stone lower pole left kidney.  Atherosclerosis.   Electronically Signed   By: Inge Rise M.D.   On: 09/10/2014 19:49    EKG:   Orders placed or performed during the hospital encounter of 09/10/14  . EKG 12-Lead  . EKG 12-Lead  . EKG 12-Lead  . EKG 12-Lead  . EKG 12-Lead  . EKG 12-Lead    ASSESSMENT AND PLAN:  #1 sepsis secondary to UTI: Continue to give IV vancomycin,follow urine cultures  results, seen by ID. Received PICC line. #2 history of hypertension controlled continue Toprol, Norvasc. #3 hypothyroidism continue Synthroid,. #4 history of peripheral neuropathy continue Lyrica   history of subdural hematoma in April 2016 . 5.chest pain . This morning c;ycle troponins monitored on telemetry. First set is negative. weakness ;physical therapy recommends SNF placement. DNR    All the records are reviewed and case discussed with Care Management/Social Workerr. Management plans discussed with the patient, family and they are in agreement.  CODE STATUS: DNR  TOTAL TIME TAKING CARE OF THIS PATIENT:35  minutes.   POSSIBLE D/C IN 1-2DAYS, DEPENDING ON CLINICAL CONDITION.   Epifanio Lesches M.D on 09/12/2014 at  11:57 AM  Between 7am to 6pm - Pager - [phone]  After 6pm go to [email or web address] - password [email or web address] [facility]  Office  [phone]  CC: Primary care physician; [name], MD

## 2014-09-12 NOTE — Care Management Important Message (Signed)
Important Message  Patient Details  Name: Krista Ellis MRN: [id number] Date of Birth: 22-Oct-1930   Medicare Important Message Given:  Yes-second notification given    [name] A Allmond 09/12/2014, 9:03 AM

## 2014-09-13 MED ORDER — ENSURE ENLIVE PO LIQD
237.0000 mL | Freq: Two times a day (BID) | ORAL | Status: DC
  Administered 2014-09-13 – 2014-09-14 (×3): 237 mL via ORAL

## 2014-09-13 NOTE — Clinical Social Work Note (Signed)
Late entry 09/13/14 for 09/11/14 Clinical Social Work Assessment  Patient Details  Name: Krista Ellis MRN: [id number] Date of Birth: Apr 07, 1930  Date of referral:  09/11/14               Reason for consult:  Facility Placement                Permission sought to share information with:  Facility Sport and exercise psychologist, PCP, Family Supports Permission granted to share information::  Yes, Verbal Permission Granted  Name::        Agency::  SNFs  Relationship::  3 daughters  Contact Information:     Housing/Transportation Living arrangements for the past 2 months:  [place] of Information:  Patient, Adult Children, Medical Team Patient Interpreter Needed:  None Criminal Activity/Legal Involvement Pertinent to Current Situation/Hospitalization:  No - Comment as needed Significant Relationships:  Adult Children, [place] Lives with:  Self Do you feel safe going back to the place where you live?  Yes Need for family participation in patient care:  Yes (Comment)  Care giving concerns:  Pt has been to SNF this summer for short-term rehab. Once returning home she had around the clock care givers in addition to her daughters providing support.    Social Worker assessment / plan:  CSW met with Pt and her daughters to discuss dc plans. Pt shared with family that she believes it is time to move into long-term care at [facility]. She states that she is "tired of the back and forth to the hospital." Pt is widowed and has 3 daughters who are very supportive. CSW met with daughters to discuss options. Family had already started talking to local facilities for availability.  CSW will follow up with sending Fl2 to SNFs and presenting bed offers to Pt and family.   Employment status:  Retired Forensic scientist:  Medicare PT Recommendations:  [place] / Referral to community resources:  [place]  Patient/Family's  Response to care:  Pt and her children are engaged with medical team, have appropriate concerns and are offering Pt support.   Patient/Family's Understanding of and Emotional Response to Diagnosis, Current Treatment, and Prognosis: Pt and family understand need for antibiotics and are in agreement that Pt will go to skilled nursing at dc for long-term care.   Emotional Assessment Appearance:  Appears stated age Attitude/Demeanor/Rapport:   (engaged; cooperative) Affect (typically observed):  Accepting Orientation:  Oriented to Situation, Oriented to  Time, Oriented to Place, Oriented to Self Alcohol / Substance use:  Never Used Psych involvement (Current and /or in the community):  No (Comment)  Discharge Needs  Concerns to be addressed:  Adjustment to Illness, Discharge Planning Concerns Readmission within the last 30 days:  No Current discharge risk:  Dependent with Mobility, Chronically ill Barriers to Discharge:  Continued Medical Work up   [employer], LCSW 09/13/2014, 7:11 AM

## 2014-09-13 NOTE — Clinical Social Work Note (Signed)
CSW has spoken to patient's daughter, Blima Dessert, [phone] and she had already received the phone call from Seth Bake at [facility] and she is in agreement with [place]. [facility] has extended an offer. Will plan to discharge to [facility] for IV ABX when time. Shela Leff MSW,LCSW [phone]

## 2014-09-13 NOTE — Clinical Social Work Note (Signed)
CSW contacted Seth Bake at [facility] to confirm the plan and Seth Bake stated she was not aware of the IV ABX need and did not believe her DON would approve to take patient. Seth Bake checked with her DON and then called me to inform me that they will not be able to take patient. Seth Bake was going to call family and let them know. Seth Bake also informed CSW that patient is a family member of an employee at [employer] and that [facility] might be able to take patient. CSW is checking into this. Shela Leff MSW,LCSW [phone]

## 2014-09-13 NOTE — Progress Notes (Signed)
Pt's family accepted SNF bed at [facility].  Pt likely to dc on Wednesday or Thursday. Family hoping for Thursday because that is when the oldest daughter will be back in town to help Pt transition. CSW advised that either day is possible and that we would help the Pt on this end as needed. Family appreciative of unit support.   Toma Copier, [place]

## 2014-09-13 NOTE — Progress Notes (Signed)
[place] [facility] [place] NAME: Krista Ellis    MR#:  [id number]  DATE OF BIRTH:  09/06/1930  SUBJECTIVE: Admitted for fever, weakness. Found to have UTI. today, just received PICC line. Patient is sitting in chair, she feels better today.   CHIEF COMPLAINT:   Chief Complaint  Patient presents with  . Weakness  . Code Sepsis    REVIEW OF SYSTEMS:   Review of Systems  Constitutional: Negative for malaise/fatigue.  Cardiovascular: Positive for chest pain.   CONSTITUTIONAL: No fever, fatigue or weakness.  EYES: No blurred or double vision.  EARS, NOSE, AND THROAT: No tinnitus or ear pain.  RESPIRATORY: No cough, shortness of breath, wheezing or hemoptysis.  CARDIOVASCULAR: No chest pain, orthopnea, edema.  GASTROINTESTINAL: No nausea, vomiting, diarrhea or abdominal pain.  GENITOURINARY: No dysuria, hematuria.  ENDOCRINE: No polyuria, nocturia,  HEMATOLOGY: No anemia, easy bruising or bleeding SKIN: No rash or lesion. MUSCULOSKELETAL: No joint pain or arthritis.   NEUROLOGIC: No tingling, numbness, weakness.  PSYCHIATRY: No anxiety or depression.   DRUG ALLERGIES:   Allergies  Allergen Reactions  . Sulfa Antibiotics Hives and Itching  . Ciprofloxacin Hives and Itching  . Doxycycline Hives and Itching  . Ferrous Sulfate Hives and Itching  . Levofloxacin Hives and Itching  . Penicillins Hives and Itching    VITALS:  Blood pressure 159/57, pulse 72, temperature 98.2 F (36.8 C), temperature source Oral, resp. rate 18, height 5\' 2"  (1.575 m), weight 51.71 kg (114 lb), SpO2 94 %.  PHYSICAL EXAMINATION:  GENERAL:  81 y.o.-year-old patient lying in the bed with no acute distress.  EYES: Pupils equal, round, reactive to light and accommodation. No scleral icterus. Extraocular muscles intact.  HEENT: Head atraumatic, normocephalic. Oropharynx and nasopharynx clear.  NECK:  Supple, no jugular venous distention. No thyroid  enlargement, no tenderness.  LUNGS: Normal breath sounds bilaterally, no wheezing, rales,rhonchi or crepitation. No use of accessory muscles of respiration.  CARDIOVASCULAR: S1, S2 normal. No murmurs, rubs, or gallops.  ABDOMEN: Soft, nontender, nondistended. Bowel sounds present. No organomegaly or mass.  EXTREMITIES: No pedal edema, cyanosis, or clubbing.  NEUROLOGIC: Cranial nerves II through XII are intact. Muscle strength 5/5 in all extremities. Sensation intact. Gait not checked.  PSYCHIATRIC: The patient is alert and oriented x 3.  SKIN: No obvious rash, lesion, or ulcer.    LABORATORY PANEL:   CBC  Recent Labs Lab 09/11/14 0601  WBC 6.0  HGB 12.4  HCT 37.0  PLT 197   ------------------------------------------------------------------------------------------------------------------  Chemistries   Recent Labs Lab 09/10/14 1709 09/11/14 0601  NA 137 142  K 4.1 3.7  CL 104 110  CO2 26 25  GLUCOSE 118* 88  BUN 17 11  CREATININE 0.65 0.52  CALCIUM 9.2 8.8*  AST 30  --   ALT 44  --   ALKPHOS 132*  --   BILITOT 0.4  --    ------------------------------------------------------------------------------------------------------------------  Cardiac Enzymes  Recent Labs Lab 09/12/14 0733  TROPONINI <0.03   ------------------------------------------------------------------------------------------------------------------  RADIOLOGY:  Dg Chest Port 1 View  09/12/2014   CLINICAL DATA:  PICC line placement.  EXAM: PORTABLE CHEST - 1 VIEW  COMPARISON:  09/10/2014  FINDINGS: There has been interval placement of right subclavian approach PICC line with tip overlying the expected location of the cavoatrial junction. There is no evidence of pneumothorax.  Cardiac silhouette is enlarged. There is blunting of the left costophrenic angle, which likely represents a left pleural  effusion and associated subsegmental atelectasis or airspace consolidation.  Severe osteoarthritic or  posttraumatic changes of the left shoulder are noted.  IMPRESSION: Status post right PICC line placement. No evidence of a pneumothorax.  Enlarged cardiac silhouette.  Possible left pleural effusion and/or left lower lobe consolidation or subsegmental atelectasis.   Electronically Signed   By: Fidela Salisbury M.D.   On: 09/12/2014 13:08    EKG:   Orders placed or performed during the hospital encounter of 09/10/14  . EKG 12-Lead  . EKG 12-Lead  . EKG 12-Lead  . EKG 12-Lead  . EKG 12-Lead  . EKG 12-Lead    ASSESSMENT AND PLAN:  #1 sepsis secondary to UTI: Continue to give IV vancomycin, and cultures showing enterococci, seen by ID. Received PICC line. Plan to discharge to [facility] tomorrow. Needs IV vancomycin for 10 days. Please refer to Dr. Ola Spurr  note. #2 history of hypertension controlled continue Toprol, Norvasc. #3 hypothyroidism continue Synthroid,. #4 history of peripheral neuropathy continue Lyrica   history of subdural hematoma in April 2016 . 5.chest pain .likley due to GERD  weakness ;physical therapy recommends SNF placement. DNR    All the records are reviewed and case discussed with Care Management/Social Workerr. Management plans discussed with the patient, family and they are in agreement.  CODE STATUS: DNR  TOTAL TIME TAKING CARE OF THIS PATIENT:35  minutes.   POSSIBLE D/C IN 1-2DAYS, DEPENDING ON CLINICAL CONDITION.   Epifanio Lesches M.D on 09/13/2014 at 2:04 PM  Between 7am to 6pm - Pager - [phone]  After 6pm go to [email or web address] - password [email or web address] [place] [facility]  Office  [phone]  CC: Primary care physician; [name], MD

## 2014-09-13 NOTE — Progress Notes (Signed)
PT Cancellation Note  Patient Details Name: CHEYENNE BORDEAUX MRN: [id number] DOB: 1930/11/19   Cancelled Treatment:    Reason Eval/Treat Not Completed: Other (comment). Treatment completed and documented during [facility] down time. Full note to be scanned in at later time.   Hermina Barnard 09/13/2014, 5:19 PM Greggory Stallion, PT, DPT [phone]

## 2014-09-13 NOTE — Progress Notes (Addendum)
   09/13/14 1700  Clinical Encounter Type  Visited With Patient and family together  Visit Type Spiritual support  Spiritual Encounters  Spiritual Needs Prayer  Stress Factors  Patient Stress Factors None identified   Status: alert and oriented, visiting with visitors from the recliner, 69yrs female/Sepsis Family: 2 neighbors and caregiver at bedside Visit Assessment: The chaplain introduced pastoral care and thanked the patient and everyone for witnessing the adv dir of another patient. The patient's neighbor (the female) said that he cooks for the patient and the patient said that he cooks for her but she does not have much of an appetite. The neighbor said that he visited a [employer] with the patient in the past.  Pastoral Care: [phone] pager or by online request

## 2014-09-13 NOTE — Clinical Social Work Placement (Signed)
   CLINICAL SOCIAL WORK PLACEMENT  NOTE  Date:  09/13/2014  Patient Details  Name: Krista Ellis MRN: [id number] Date of Birth: Sep 04, 1930  Clinical Social Work is seeking post-discharge placement for this patient at the [place] level of care (*CSW will initial, date and re-position this form in  chart as items are completed):  Yes   Patient/family provided with [place] Work Department's list of facilities offering this level of care within the geographic area requested by the patient (or if unable, by the patient's family).  Yes   Patient/family informed of their freedom to choose among providers that offer the needed level of care, that participate in Medicare, Medicaid or managed care program needed by the patient, have an available bed and are willing to accept the patient.  Yes   Patient/family informed of [facility]'s ownership interest in [facility] and [facility], as well as of the fact that they are under no obligation to receive care at these facilities.  PASRR submitted to EDS on       PASRR number received on       Existing PASRR number confirmed on 09/12/14     FL2 transmitted to all facilities in geographic area requested by pt/family on 09/12/14     FL2 transmitted to all facilities within larger geographic area on       Patient informed that his/her managed care company has contracts with or will negotiate with certain facilities, including the following:        Yes   Patient/family informed of bed offers received.  Patient chooses bed at [facility]     Physician recommends and patient chooses bed at      Patient to be transferred to [facility] on  .  Patient to be transferred to facility by       Patient family notified on   of transfer.  Name of family member notified:        PHYSICIAN       Additional Comment:    _______________________________________________ Alonna Buckler, LCSW 09/13/2014,  8:41 AM

## 2014-09-14 ENCOUNTER — Encounter: Admission: RE | Admit: 2014-09-14 | Payer: Medicare Other | Source: Ambulatory Visit | Admitting: Internal Medicine

## 2014-09-14 DIAGNOSIS — Z880 Allergy status to penicillin: Secondary | ICD-10-CM | POA: Diagnosis not present

## 2014-09-14 DIAGNOSIS — Z87440 Personal history of urinary (tract) infections: Secondary | ICD-10-CM | POA: Diagnosis not present

## 2014-09-14 DIAGNOSIS — E039 Hypothyroidism, unspecified: Secondary | ICD-10-CM | POA: Diagnosis present

## 2014-09-14 DIAGNOSIS — I252 Old myocardial infarction: Secondary | ICD-10-CM | POA: Diagnosis not present

## 2014-09-14 DIAGNOSIS — Z66 Do not resuscitate: Secondary | ICD-10-CM | POA: Diagnosis present

## 2014-09-14 DIAGNOSIS — G629 Polyneuropathy, unspecified: Secondary | ICD-10-CM | POA: Diagnosis not present

## 2014-09-14 DIAGNOSIS — N3020 Other chronic cystitis without hematuria: Secondary | ICD-10-CM | POA: Diagnosis present

## 2014-09-14 DIAGNOSIS — R0902 Hypoxemia: Secondary | ICD-10-CM | POA: Diagnosis present

## 2014-09-14 DIAGNOSIS — N390 Urinary tract infection, site not specified: Secondary | ICD-10-CM

## 2014-09-14 DIAGNOSIS — R0689 Other abnormalities of breathing: Secondary | ICD-10-CM | POA: Diagnosis not present

## 2014-09-14 DIAGNOSIS — Z79899 Other long term (current) drug therapy: Secondary | ICD-10-CM | POA: Diagnosis not present

## 2014-09-14 DIAGNOSIS — R918 Other nonspecific abnormal finding of lung field: Secondary | ICD-10-CM | POA: Diagnosis not present

## 2014-09-14 DIAGNOSIS — N3000 Acute cystitis without hematuria: Secondary | ICD-10-CM | POA: Diagnosis not present

## 2014-09-14 DIAGNOSIS — Z8249 Family history of ischemic heart disease and other diseases of the circulatory system: Secondary | ICD-10-CM | POA: Diagnosis not present

## 2014-09-14 DIAGNOSIS — J309 Allergic rhinitis, unspecified: Secondary | ICD-10-CM | POA: Diagnosis not present

## 2014-09-14 DIAGNOSIS — I739 Peripheral vascular disease, unspecified: Secondary | ICD-10-CM | POA: Diagnosis present

## 2014-09-14 DIAGNOSIS — Z882 Allergy status to sulfonamides status: Secondary | ICD-10-CM | POA: Diagnosis not present

## 2014-09-14 DIAGNOSIS — Z8673 Personal history of transient ischemic attack (TIA), and cerebral infarction without residual deficits: Secondary | ICD-10-CM | POA: Diagnosis not present

## 2014-09-14 DIAGNOSIS — Z792 Long term (current) use of antibiotics: Secondary | ICD-10-CM | POA: Diagnosis not present

## 2014-09-14 DIAGNOSIS — I10 Essential (primary) hypertension: Secondary | ICD-10-CM | POA: Diagnosis present

## 2014-09-14 DIAGNOSIS — M1990 Unspecified osteoarthritis, unspecified site: Secondary | ICD-10-CM | POA: Diagnosis present

## 2014-09-14 DIAGNOSIS — Z79891 Long term (current) use of opiate analgesic: Secondary | ICD-10-CM | POA: Diagnosis not present

## 2014-09-14 DIAGNOSIS — R011 Cardiac murmur, unspecified: Secondary | ICD-10-CM | POA: Diagnosis present

## 2014-09-14 DIAGNOSIS — N3941 Urge incontinence: Secondary | ICD-10-CM | POA: Diagnosis present

## 2014-09-14 DIAGNOSIS — B952 Enterococcus as the cause of diseases classified elsewhere: Secondary | ICD-10-CM | POA: Diagnosis present

## 2014-09-14 DIAGNOSIS — D689 Coagulation defect, unspecified: Secondary | ICD-10-CM | POA: Diagnosis present

## 2014-09-14 DIAGNOSIS — M6281 Muscle weakness (generalized): Secondary | ICD-10-CM | POA: Diagnosis not present

## 2014-09-14 DIAGNOSIS — R05 Cough: Secondary | ICD-10-CM | POA: Diagnosis not present

## 2014-09-14 DIAGNOSIS — A419 Sepsis, unspecified organism: Secondary | ICD-10-CM | POA: Diagnosis not present

## 2014-09-14 DIAGNOSIS — E785 Hyperlipidemia, unspecified: Secondary | ICD-10-CM | POA: Diagnosis not present

## 2014-09-14 DIAGNOSIS — E538 Deficiency of other specified B group vitamins: Secondary | ICD-10-CM | POA: Diagnosis not present

## 2014-09-14 DIAGNOSIS — I2699 Other pulmonary embolism without acute cor pulmonale: Secondary | ICD-10-CM | POA: Diagnosis not present

## 2014-09-14 DIAGNOSIS — Z7409 Other reduced mobility: Secondary | ICD-10-CM | POA: Diagnosis not present

## 2014-09-14 DIAGNOSIS — Z888 Allergy status to other drugs, medicaments and biological substances status: Secondary | ICD-10-CM | POA: Diagnosis not present

## 2014-09-14 DIAGNOSIS — R911 Solitary pulmonary nodule: Secondary | ICD-10-CM | POA: Diagnosis present

## 2014-09-14 DIAGNOSIS — Z452 Encounter for adjustment and management of vascular access device: Secondary | ICD-10-CM | POA: Diagnosis not present

## 2014-09-14 DIAGNOSIS — A4189 Other specified sepsis: Secondary | ICD-10-CM | POA: Diagnosis not present

## 2014-09-14 DIAGNOSIS — R531 Weakness: Secondary | ICD-10-CM

## 2014-09-14 DIAGNOSIS — R4182 Altered mental status, unspecified: Secondary | ICD-10-CM | POA: Diagnosis not present

## 2014-09-14 DIAGNOSIS — Z881 Allergy status to other antibiotic agents status: Secondary | ICD-10-CM | POA: Diagnosis not present

## 2014-09-14 DIAGNOSIS — J189 Pneumonia, unspecified organism: Secondary | ICD-10-CM | POA: Diagnosis not present

## 2014-09-14 DIAGNOSIS — E780 Pure hypercholesterolemia: Secondary | ICD-10-CM | POA: Diagnosis present

## 2014-09-14 DIAGNOSIS — R0602 Shortness of breath: Secondary | ICD-10-CM | POA: Diagnosis not present

## 2014-09-14 LAB — VANCOMYCIN, TROUGH: VANCOMYCIN TR: 7 ug/mL — AB (ref 10–20)

## 2014-09-14 MED ORDER — DOCUSATE SODIUM 100 MG PO CAPS: 100.0000 mg | ORAL_CAPSULE | Freq: Two times a day (BID) | ORAL | Status: DC | PRN

## 2014-09-14 MED ORDER — VANCOMYCIN HCL 500 MG IV SOLR: 500.0000 mg | Freq: Two times a day (BID) | INTRAVENOUS | Status: DC

## 2014-09-14 MED ORDER — HYDROCODONE-ACETAMINOPHEN 5-325 MG PO TABS: 1.0000 | ORAL_TABLET | Freq: Three times a day (TID) | ORAL | Status: DC | PRN

## 2014-09-14 MED ORDER — VANCOMYCIN HCL 500 MG IV SOLR
500.0000 mg | Freq: Two times a day (BID) | INTRAVENOUS | Status: DC
  Administered 2014-09-14: 500 mg via INTRAVENOUS
  Filled 2014-09-14 (×4): qty 500

## 2014-09-14 MED ORDER — SENNA 8.6 MG PO TABS: 1.0000 | ORAL_TABLET | Freq: Every day | ORAL | Status: DC | PRN

## 2014-09-14 MED ORDER — VANCOMYCIN HCL IN DEXTROSE 750-5 MG/150ML-% IV SOLN: 750.0000 mg | INTRAVENOUS | Status: DC

## 2014-09-14 MED ORDER — OXYCODONE-ACETAMINOPHEN 5-325 MG PO TABS: 1.0000 | ORAL_TABLET | Freq: Four times a day (QID) | ORAL | Status: DC | PRN

## 2014-09-14 MED ORDER — SODIUM CHLORIDE 0.9 % IJ SOLN
10.0000 mL | Freq: Two times a day (BID) | INTRAMUSCULAR | Status: DC
Start: 2014-09-14 — End: 2014-09-24

## 2014-09-14 NOTE — Discharge Summary (Addendum)
[facility] [place] NAME: Krista Ellis    MR#:  [id number]  DATE OF BIRTH:  [birth year]  DATE OF ADMISSION:  09/10/2014 ADMITTING PHYSICIAN: Gladstone Lighter, MD  DATE OF DISCHARGE: No discharge date for patient encounter.  PRIMARY CARE PHYSICIAN: [name], MD     ADMISSION DIAGNOSIS:  CAP (community acquired pneumonia) [C58.5] Complicated UTI (urinary tract infection) [N39.0] Sepsis [A41.9] Sepsis, due to unspecified organism [A41.9]  DISCHARGE DIAGNOSIS:  Active Problems:   Sepsis   UTI (urinary tract infection)   Enterococcus UTI   Generalized weakness   SECONDARY DIAGNOSIS:   Past Medical History  Diagnosis Date  . Hypertension   . Hyperlipidemia   . Arthritis   . Hemihypertrophy   . Myocardial infarction   . Thyroid disease   . CVA (cerebral infarction) 2003  . Peripheral vascular disease     s/p stent right leg  . Pulmonary nodule     stable on Chest CT March 2014, Followed at [facility]  . Murmur, cardiac   . Chronic cystitis   . Hematuria   . Bleeding disorder   . Recurrent UTI   . Gross hematuria   . Urinary frequency   . Dysuria   . Sensory urge incontinence   . History of kidney stones     .pro HOSPITAL COURSE:  Krista Ellis is a 81 year old Caucasian female with past history significant for history of recurrent UTIs who presented to the hospital with fevers as well as weakness. Apparently patient was followed by urologist for urinary tract infection due to enterococcus and was initiated and on Macrobid however, developed high fevers to 102 , difficulty breathing and nausea . On arrival to the hospital patient's labs were unremarkable, except for urine analysis which still revealed too numerous to count white blood cells, 3+ leukocyte esterase, chest x-ray revealed atelectasis in the left lung more than the right, CT of abdomen and pelvis without contrast showed small bilateral  effusions and basilar lung disease, worse on the left, new sclerosis at T11 vertebral body, rule out metastasis and whole body bone scan was recommended. Patient was also noted to have acute or subacute right inferior pubic ramus fracture. Sigmoid diverticulosis but no diverticulitis and 0.4 cm nonobstructing stone in lower pole of left kidney. Patient was initiated on vancomycin per suggestion of Dr. Blane Ohara infectious disease specialist and did well overall. She was evaluated by physical therapist recommended rehabilitation placement. Discussion by problem #1 sepsis secondary to UTI: Continue  IV vancomycin for 8 more days to complete 10 day course, cultures showed enterococci, seen by ID. PICC line placed 23rd of August 79 2016. Discharge patient to [facility] rehabilitation facility today. Follow-up with Dr. Ola Spurr and initiate nitrofurantoin suppressive therapy after completion of vancomycin therapy. #2 history of hypertension controlled continue Toprol, Norvasc. #3 hypothyroidism continue Synthroid,. #4 history of peripheral neuropathy continue Lyrica #5 history of subdural hematoma in April 2016, supportive therapy, physical therapy . #6.chest pain, suspected was due to GERD , supportive therapy and evaluation if recurrent #7 . Generalized weakness ;physical therapy recommends SNF placement, placement today. DNR Discussed with patient's family. All questions were answered , voiced understanding  DISCHARGE CONDITIONS:   Fair  CONSULTS OBTAINED:  Treatment Team:  Adrian Prows, MD  DRUG ALLERGIES:   Allergies  Allergen Reactions  . Sulfa Antibiotics Hives and Itching  . Ciprofloxacin Hives and Itching  . Doxycycline Hives and Itching  . Ferrous Sulfate Hives and Itching  .  Levofloxacin Hives and Itching  . Penicillins Hives and Itching    DISCHARGE MEDICATIONS:   Current Discharge Medication List    START taking these medications   Details  docusate sodium (COLACE)  100 MG capsule Take 1 capsule (100 mg total) by mouth 2 (two) times daily as needed for mild constipation. Qty: 10 capsule, Refills: 0    senna (SENOKOT) 8.6 MG TABS tablet Take 1 tablet (8.6 mg total) by mouth daily as needed for mild constipation. Qty: 120 each, Refills: 0    sodium chloride 0.9 % injection Inject 10 mLs into the vein every 12 (twelve) hours. Qty: 10 mL, Refills: 20    vancomycin 500 mg in sodium chloride 0.9 % 100 mL Inject 500 mg into the vein every 12 (twelve) hours. Qty: 16 Dose, Refills: 0      CONTINUE these medications which have CHANGED   Details  HYDROcodone-acetaminophen (NORCO/VICODIN) 5-325 MG per tablet Take 1 tablet by mouth 3 (three) times daily as needed for moderate pain. Qty: 30 tablet, Refills: 0   Associated Diagnoses: Osteoarthritis, unspecified osteoarthritis type, unspecified site    oxyCODONE-acetaminophen (ROXICET) 5-325 MG per tablet Take 1 tablet by mouth every 6 (six) hours as needed. Qty: 20 tablet, Refills: 0      CONTINUE these medications which have NOT CHANGED   Details  acetaminophen (TYLENOL) 325 MG tablet Take 325 mg by mouth every 6 (six) hours as needed for mild pain or headache.    amLODipine (NORVASC) 5 MG tablet Take 5 mg by mouth daily.    cyanocobalamin (,VITAMIN B-12,) 1000 MCG/ML injection Inject 1,000 mcg into the muscle every 30 (thirty) days.     feeding supplement, ENSURE ENLIVE, (ENSURE ENLIVE) LIQD Take 237 mLs by mouth 2 (two) times daily after a meal. Qty: 237 mL, Refills: 12    levothyroxine (SYNTHROID, LEVOTHROID) 25 MCG tablet TAKE 1 TABLET (25 MCG TOTAL) BY MOUTH DAILY. Qty: 90 tablet, Refills: 1    metoprolol succinate (TOPROL-XL) 25 MG 24 hr tablet Take 12.5 mg by mouth every morning.     pravastatin (PRAVACHOL) 40 MG tablet Take 1 tablet (40 mg total) by mouth every evening. Qty: 90 tablet, Refills: 1    pregabalin (LYRICA) 50 MG capsule Take 1 capsule (50 mg total) by mouth 2 (two) times  daily. Qty: 60 capsule, Refills: 6    loratadine (CLARITIN) 10 MG tablet Take 1 tablet (10 mg total) by mouth daily.      STOP taking these medications     nitrofurantoin, macrocrystal-monohydrate, (MACROBID) 100 MG capsule      gentamicin ointment (GARAMYCIN) 0.1 %          DISCHARGE INSTRUCTIONS:    Follow-up with primary care physician, Dr. Ronette Deter in 2-3 days after discharge. Also, infectious disease specialist, Dr. Ola Spurr, initiate urinary tract infection suppressive therapy as outpatient with antibiotics after completion of vancomycin course  If you experience worsening of your admission symptoms, develop shortness of breath, life threatening emergency, suicidal or homicidal thoughts you must seek medical attention immediately by calling 911 or calling your MD immediately  if symptoms less severe.  You Must read complete instructions/literature along with all the possible adverse reactions/side effects for all the Medicines you take and that have been prescribed to you. Take any new Medicines after you have completely understood and accept all the possible adverse reactions/side effects.   Please note  You were cared for by a hospitalist during your hospital stay. If you have  any questions about your discharge medications or the care you received while you were in the hospital after you are discharged, you can call the unit and asked to speak with the hospitalist on call if the hospitalist that took care of you is not available. Once you are discharged, your primary care physician will handle any further medical issues. Please note that NO REFILLS for any discharge medications will be authorized once you are discharged, as it is imperative that you return to your primary care physician (or establish a relationship with a primary care physician if you do not have one) for your aftercare needs so that they can reassess your need for medications and monitor your lab  values.    Today   CHIEF COMPLAINT:   Chief Complaint  Patient presents with  . Weakness  . Code Sepsis    HISTORY OF PRESENT ILLNESS:  Ellason Segar  is a 81 y.o. female with a known history of recurrent UTIs who presented to the hospital with fevers as well as weakness. Apparently patient was followed by urologist for urinary tract infection due to enterococcus and was initiated and on Macrobid however, developed high fevers to 102 , difficulty breathing and nausea . On arrival to the hospital patient's labs were unremarkable, except for urine analysis which still revealed too numerous to count white blood cells, 3+ leukocyte esterase, chest x-ray revealed atelectasis in the left lung more than the right, CT of abdomen and pelvis without contrast showed small bilateral effusions and basilar lung disease, worse on the left, new sclerosis at T11 vertebral body, rule out metastasis and whole body bone scan was recommended. Patient was also noted to have acute or subacute right inferior pubic ramus fracture. Sigmoid diverticulosis but no diverticulitis and 0.4 cm nonobstructing stone in lower pole of left kidney. Patient was initiated on vancomycin per suggestion of Dr. Blane Ohara infectious disease specialist and did well overall. She was evaluated by physical therapist recommended rehabilitation placement. Discussion by problem #1 sepsis secondary to UTI: Continue  IV vancomycin for 8 more days to complete 10 day course, cultures showed enterococci, seen by ID. PICC line placed 23rd of August 81 2016. Discharge patient to [facility] rehabilitation facility today. Follow-up with Dr. Ola Spurr and initiate nitrofurantoin suppressive therapy after completion of vancomycin therapy. #2 history of hypertension controlled continue Toprol, Norvasc. #3 hypothyroidism continue Synthroid,. #4 history of peripheral neuropathy continue Lyrica #5 history of subdural hematoma in April 2016, supportive  therapy, physical therapy . #6.chest pain, suspected was due to GERD , supportive therapy and evaluation if recurrent #7 . Generalized weakness ;physical therapy recommends SNF placement, placement today. DNR Discussed with patient's family. All questions were answered , voiced understanding    VITAL SIGNS:  Blood pressure 134/44, pulse 73, temperature 97.5 F (36.4 C), temperature source Oral, resp. rate 22, height 5\' 2"  (1.575 m), weight 51.71 kg (114 lb), SpO2 95 %.  I/O:    Intake/Output Summary (Last 24 hours) at 09/14/14 1159 Last data filed at 09/14/14 0850  Gross per 24 hour  Intake    373 ml  Output    100 ml  Net    273 ml    PHYSICAL EXAMINATION:  GENERAL:  81 y.o.-year-old patient lying in the bed with no acute distress.  EYES: Pupils equal, round, reactive to light and accommodation. No scleral icterus. Extraocular muscles intact.  HEENT: Head atraumatic, normocephalic. Oropharynx and nasopharynx clear.  NECK:  Supple, no jugular venous distention. No thyroid enlargement, no  tenderness.  LUNGS: Normal breath sounds bilaterally, no wheezing, rales,rhonchi or crepitation. No use of accessory muscles of respiration.  CARDIOVASCULAR: S1, S2 normal. No murmurs, rubs, or gallops.  ABDOMEN: Soft, non-tender, non-distended. Bowel sounds present. No organomegaly or mass.  EXTREMITIES: No pedal edema, cyanosis, or clubbing.  NEUROLOGIC: Cranial nerves II through XII are intact. Muscle strength 5/5 in all extremities. Sensation intact. Gait not checked.  PSYCHIATRIC: The patient is alert and oriented x 3.  SKIN: No obvious rash, lesion, or ulcer.   DATA REVIEW:   CBC  Recent Labs Lab 09/11/14 0601  WBC 6.0  HGB 12.4  HCT 37.0  PLT 197    Chemistries   Recent Labs Lab 09/10/14 1709 09/11/14 0601  NA 137 142  K 4.1 3.7  CL 104 110  CO2 26 25  GLUCOSE 118* 88  BUN 17 11  CREATININE 0.65 0.52  CALCIUM 9.2 8.8*  AST 30  --   ALT 44  --   ALKPHOS 132*  --    BILITOT 0.4  --     Cardiac Enzymes  Recent Labs Lab 09/12/14 0733  TROPONINI <0.03    Microbiology Results  Results for orders placed or performed during the hospital encounter of 09/10/14  Culture, blood (routine x 2)     Status: None (Preliminary result)   Collection Time: 09/10/14  5:00 PM  Result Value Ref Range Status   Specimen Description BLOOD LEFT ARM  Final   Special Requests   Final    BOTTLES DRAWN AEROBIC AND ANAEROBIC  AER 5CC ANA 3CC   Culture NO GROWTH 4 DAYS  Final   Report Status PENDING  Incomplete  Culture, blood (routine x 2)     Status: None (Preliminary result)   Collection Time: 09/10/14  5:14 PM  Result Value Ref Range Status   Specimen Description BLOOD RIGHT ARM  Final   Special Requests   Final    BOTTLES DRAWN AEROBIC AND ANAEROBIC  AER 5CC ANA [place]   Culture NO GROWTH 4 DAYS  Final   Report Status PENDING  Incomplete  Urine culture     Status: None   Collection Time: 09/10/14  5:31 PM  Result Value Ref Range Status   Specimen Description URINE, RANDOM  Final   Special Requests NONE  Final   Culture MULTIPLE SPECIES PRESENT, SUGGEST RECOLLECTION  Final   Report Status 09/12/2014 FINAL  Final    RADIOLOGY:  Dg Chest Port 1 View  09/12/2014   CLINICAL DATA:  PICC line placement.  EXAM: PORTABLE CHEST - 1 VIEW  COMPARISON:  09/10/2014  FINDINGS: There has been interval placement of right subclavian approach PICC line with tip overlying the expected location of the cavoatrial junction. There is no evidence of pneumothorax.  Cardiac silhouette is enlarged. There is blunting of the left costophrenic angle, which likely represents a left pleural effusion and associated subsegmental atelectasis or airspace consolidation.  Severe osteoarthritic or posttraumatic changes of the left shoulder are noted.  IMPRESSION: Status post right PICC line placement. No evidence of a pneumothorax.  Enlarged cardiac silhouette.  Possible left pleural effusion and/or left  lower lobe consolidation or subsegmental atelectasis.   Electronically Signed   By: Fidela Salisbury M.D.   On: 09/12/2014 13:08    EKG:   Orders placed or performed during the hospital encounter of 09/10/14  . EKG 12-Lead  . EKG 12-Lead  . EKG 12-Lead  . EKG 12-Lead  . EKG 12-Lead  .  EKG 12-Lead      Management plans discussed with the patient, family and they are in agreement.  CODE STATUS:     Code Status Orders        Start     Ordered   09/10/14 2137  Do not attempt resuscitation (DNR)   Continuous    Question Answer Comment  In the event of cardiac or respiratory ARREST Do not call a "code blue"   In the event of cardiac or respiratory ARREST Do not perform Intubation, CPR, defibrillation or ACLS   In the event of cardiac or respiratory ARREST Use medication by any route, position, wound care, and other measures to relive pain and suffering. May use oxygen, suction and manual treatment of airway obstruction as needed for comfort.      09/10/14 2137    Advance Directive Documentation        Most Recent Value   Type of Advance Directive  Healthcare Power of Attorney   Pre-existing out of facility DNR order (yellow form or pink MOST form)     "MOST" Form in Place?        TOTAL TIME TAKING CARE OF THIS PATIENT: 40 minutes.    Theodoro Grist M.D on 09/14/2014 at 11:59 AM  Between 7am to 6pm - Pager - [phone]  After 6pm go to [email or web address] - password [email or web address] [facility]  Office  [phone]  CC: Primary care physician; [name], MD

## 2014-09-14 NOTE — Clinical Social Work Placement (Signed)
   CLINICAL SOCIAL WORK PLACEMENT  NOTE  Date:  09/14/2014  Patient Details  Name: Krista Ellis MRN: [id number] Date of Birth: 08/22/1930  Clinical Social Work is seeking post-discharge placement for this patient at the [place] level of care (*CSW will initial, date and re-position this form in  chart as items are completed):  Yes   Patient/family provided with [place] Work Department's list of facilities offering this level of care within the geographic area requested by the patient (or if unable, by the patient's family).  Yes   Patient/family informed of their freedom to choose among providers that offer the needed level of care, that participate in Medicare, Medicaid or managed care program needed by the patient, have an available bed and are willing to accept the patient.  Yes   Patient/family informed of [facility]'s ownership interest in [facility] and [facility], as well as of the fact that they are under no obligation to receive care at these facilities.  PASRR submitted to EDS on       PASRR number received on       Existing PASRR number confirmed on 09/12/14     FL2 transmitted to all facilities in geographic area requested by pt/family on 09/12/14     FL2 transmitted to all facilities within larger geographic area on       Patient informed that his/her managed care company has contracts with or will negotiate with certain facilities, including the following:        Yes   Patient/family informed of bed offers received.  Patient chooses bed at [facility]     Physician recommends and patient chooses bed at      Patient to be transferred to [facility] on 09/14/14.  Patient to be transferred to facility by  (EMS)     Patient family notified on 09/14/14 of transfer.  Name of family member notified:  [name]     PHYSICIAN Please sign FL2     Additional Comment:     _______________________________________________ Shela Leff, LCSW 09/14/2014, 3:39 PM

## 2014-09-14 NOTE — Clinical Social Work Note (Signed)
Patient to discharge today to [facility]. Patient's daughter, [name], is in town and has a meeting with a Air traffic controller and she stated she would call when she was on her way back from the meeting so we could call EMS. Discharge summary information sent to [facility]. Nurse has called report. [name] called and gave approval for calling EMS.  Shela Leff MSW,LCSW [phone]

## 2014-09-14 NOTE — Discharge Instructions (Signed)

## 2014-09-14 NOTE — Progress Notes (Addendum)
PT Cancellation Note  Patient Details Name: Krista Ellis MRN: [id number] DOB: September 06, 1930   Cancelled Treatment:    Reason Eval/Treat Not Completed: Other (comment). Per chart review, order cancelled by Frances Furbish, RN by accident. Per discussion this AM, no indication for cancellation. Spoke to RN and she will issue new order for therapy services. No re-evaluation required.   Hildreth Orsak 09/14/2014, 8:56 AM  Greggory Stallion, PT, DPT [phone]

## 2014-09-14 NOTE — Progress Notes (Signed)
Alert and oriented. Vss. No signs of acute distress. Report given to [place] at [facility]. Transported by EMS.

## 2014-09-14 NOTE — Consult Note (Addendum)
ANTIBIOTIC CONSULT NOTE - FOLLOW UP  Pharmacy Consult for vancomycin Indication: UTI  Allergies  Allergen Reactions  . Sulfa Antibiotics Hives and Itching  . Ciprofloxacin Hives and Itching  . Doxycycline Hives and Itching  . Ferrous Sulfate Hives and Itching  . Levofloxacin Hives and Itching  . Penicillins Hives and Itching    Patient Measurements: Height: 5\' 2"  (157.5 cm) Weight: 114 lb (51.71 kg) IBW/kg (Calculated) : 50.1 Adjusted Body Weight:   Vital Signs: Temp: 97.5 F (36.4 C) (08/25 0912) Temp Source: Oral (08/25 0912) BP: 134/44 mmHg (08/25 0912) Pulse Rate: 73 (08/25 0912) Intake/Output from previous day: 08/24 0701 - 08/25 0700 In: 775.5 [P.O.:480; I.V.:295.5] Out: 100 [Urine:100] Intake/Output from this shift: Total I/O In: 120 [P.O.:120] Out: 0   Labs: No results for input(s): WBC, HGB, PLT, LABCREA, CREATININE in the last 72 hours. Estimated Creatinine Clearance: 42.9 mL/min (by C-G formula based on Cr of 0.52).  Recent Labs  09/14/14 1038  [place] 7*     Microbiology: Recent Results (from the past 720 hour(s))  Microscopic Examination     Status: Abnormal   Collection Time: 09/01/14  4:24 PM  Result Value Ref Range Status   WBC, UA >30 (H) 0 -  5 /hpf Final   RBC, UA >30 (H) 0 -  2 /hpf Final   Epithelial Cells (non renal) None seen 0 - 10 /hpf Final   Crystals Present (A) N/A Final   Crystal Type Amorphous Sediment N/A Final   Bacteria, UA Many (A) None seen/Few Final  CULTURE, URINE COMPREHENSIVE     Status: Abnormal   Collection Time: 09/01/14  4:25 PM  Result Value Ref Range Status   Urine Culture, Comprehensive Final report (A)  Final   Result 1 Enterococcus species (A)  Final    Comment: Greater than 100,000 colony forming units per mL Note: this isolate is vancomycin-susceptible. This information is provided for epidemiologic purposes only: vancomycin is not among the antibiotics recommended for therapy of urinary tract  infections caused by Enterococcus.    Result 2 Comment  Final    Comment: Mixed urogenital flora 10,000-25,000 colony forming units per mL    ANTIMICROBIAL SUSCEPTIBILITY Comment  Final    Comment:       ** S = Susceptible; I = Intermediate; R = Resistant **                    P = Positive; N = Negative             MICS are expressed in micrograms per mL    Antibiotic                 RSLT#1    RSLT#2    RSLT#3    RSLT#4 Ciprofloxacin                  R Levofloxacin                   R Nitrofurantoin                 S Penicillin                     S Tetracycline                   R Vancomycin                     S  CULTURE, URINE COMPREHENSIVE     Status: None   Collection Time: 09/08/14  8:43 AM  Result Value Ref Range Status   Urine Culture, Comprehensive Final report  Final   Result 1 Comment  Final    Comment: Mixed urogenital flora Greater than 100,000 colony forming units per mL   Microscopic Examination     Status: Abnormal   Collection Time: 09/08/14  8:43 AM  Result Value Ref Range Status   WBC, UA >30 (H) 0 -  5 /hpf Final   RBC, UA >30 (H) 0 -  2 /hpf Final   Epithelial Cells (non renal) None seen 0 - 10 /hpf Final   Bacteria, UA Many (A) None seen/Few Final  Culture, blood (routine x 2)     Status: None (Preliminary result)   Collection Time: 09/10/14  5:00 PM  Result Value Ref Range Status   Specimen Description BLOOD LEFT ARM  Final   Special Requests   Final    BOTTLES DRAWN AEROBIC AND ANAEROBIC  AER 5CC ANA 3CC   Culture NO GROWTH 4 DAYS  Final   Report Status PENDING  Incomplete  Culture, blood (routine x 2)     Status: None (Preliminary result)   Collection Time: 09/10/14  5:14 PM  Result Value Ref Range Status   Specimen Description BLOOD RIGHT ARM  Final   Special Requests   Final    BOTTLES DRAWN AEROBIC AND ANAEROBIC  AER 5CC ANA [place]   Culture NO GROWTH 4 DAYS  Final   Report Status PENDING  Incomplete  Urine culture     Status: None    Collection Time: 09/10/14  5:31 PM  Result Value Ref Range Status   Specimen Description URINE, RANDOM  Final   Special Requests NONE  Final   Culture MULTIPLE SPECIES PRESENT, SUGGEST RECOLLECTION  Final   Report Status 09/12/2014 FINAL  Final    Anti-infectives    Start     Dose/Rate Route Frequency Ordered Stop   09/14/14 0000  Vancomycin (VANCOCIN) 750 MG/150ML SOLN     750 mg 150 mL/hr over 60 Minutes Intravenous Every 24 hours 09/14/14 1046     09/11/14 1100  vancomycin (VANCOCIN) IVPB 750 mg/150 ml premix     750 mg 150 mL/hr over 60 Minutes Intravenous Every 24 hours 09/10/14 2204     09/10/14 2300  vancomycin (VANCOCIN) IVPB 750 mg/150 ml premix     750 mg 150 mL/hr over 60 Minutes Intravenous  Once 09/10/14 2204 09/10/14 2335   09/10/14 1730  cefTAZidime (FORTAZ) 2 g in dextrose 5 % 50 mL IVPB     2 g 100 mL/hr over 30 Minutes Intravenous  Once 09/10/14 1730 09/10/14 1952   09/10/14 1715  cefTAZidime (FORTAZ) 2 g in dextrose 5 % 50 mL IVPB  Status:  Discontinued     2 g 100 mL/hr over 30 Minutes Intravenous 3 times per day 09/10/14 1701 09/10/14 2137      Assessment:  VT =7 New ke=0.059 T1/2=12 Vd=36  Goal of Therapy:  Vancomycin trough level 15-20 mcg/ml  Plan:  Change dose to 500mg  q 12 hours. Recheck at steady state prior to 5th dose.  Brooklin Rieger D Shatona Andujar 09/14/2014,11:47 AM

## 2014-09-14 NOTE — Care Management Important Message (Signed)
Important Message  Patient Details  Name: Krista Ellis MRN: [id number] Date of Birth: [birth year]   Medicare Important Message Given:  Yes-third notification given    [name] A Allmond 09/14/2014, 10:12 AM

## 2014-09-15 DIAGNOSIS — A419 Sepsis, unspecified organism: Secondary | ICD-10-CM | POA: Diagnosis not present

## 2014-09-15 DIAGNOSIS — N390 Urinary tract infection, site not specified: Secondary | ICD-10-CM | POA: Diagnosis not present

## 2014-09-15 DIAGNOSIS — R531 Weakness: Secondary | ICD-10-CM | POA: Diagnosis not present

## 2014-09-15 DIAGNOSIS — J189 Pneumonia, unspecified organism: Secondary | ICD-10-CM | POA: Diagnosis not present

## 2014-09-15 LAB — CULTURE, BLOOD (ROUTINE X 2)
CULTURE: NO GROWTH
CULTURE: NO GROWTH

## 2014-09-16 ENCOUNTER — Other Ambulatory Visit
Admission: RE | Admit: 2014-09-16 | Discharge: 2014-09-16 | Disposition: A | Discharge status: Home / Self Care | Payer: No Typology Code available for payment source | Source: Ambulatory Visit | Attending: Internal Medicine | Admitting: Internal Medicine

## 2014-09-16 DIAGNOSIS — A4189 Other specified sepsis: Secondary | ICD-10-CM | POA: Insufficient documentation

## 2014-09-16 DIAGNOSIS — N390 Urinary tract infection, site not specified: Secondary | ICD-10-CM | POA: Insufficient documentation

## 2014-09-16 LAB — CREATININE, SERUM
Creatinine, Ser: 0.74 mg/dL (ref 0.44–1.00)
GFR calc Af Amer: >60 mL/min (ref >60)
GFR calc non Af Amer: >60 mL/min (ref >60)

## 2014-09-16 LAB — VANCOMYCIN, TROUGH: Vancomycin Tr: 13 ug/mL (ref 10–20)

## 2014-09-18 ENCOUNTER — Telehealth: Payer: Self-pay

## 2014-09-18 NOTE — Telephone Encounter (Signed)
Pt currently discharged to [facility] rehab then to skilled nursing facility. Diagnosed with being septic with the UTI and pnuemonia.  Daughter [name] will call back and follow up with an appointment when she returns home.

## 2014-09-18 NOTE — Telephone Encounter (Signed)
Unable to reach patient.  First TCM attempt.  Will continue to follow.

## 2014-09-21 ENCOUNTER — Encounter
Admission: RE | Admit: 2014-09-21 | Discharge: 2014-09-21 | Disposition: A | Discharge status: Home / Self Care | Payer: Medicare Other | Source: Ambulatory Visit | Attending: Internal Medicine | Admitting: Internal Medicine

## 2014-09-21 ENCOUNTER — Encounter: Payer: Self-pay | Admitting: *Deleted

## 2014-09-21 DIAGNOSIS — Z792 Long term (current) use of antibiotics: Secondary | ICD-10-CM | POA: Diagnosis not present

## 2014-09-21 DIAGNOSIS — N390 Urinary tract infection, site not specified: Secondary | ICD-10-CM | POA: Diagnosis not present

## 2014-09-22 ENCOUNTER — Emergency Department: Payer: Medicare Other

## 2014-09-22 ENCOUNTER — Inpatient Hospital Stay
Admission: EM | Admit: 2014-09-22 | Discharge: 2014-09-24 | DRG: 176 | Disposition: A | Discharge status: Skilled Nursing Facility | Payer: Medicare Other | Attending: Internal Medicine | Admitting: Internal Medicine

## 2014-09-22 ENCOUNTER — Encounter: Payer: Self-pay | Admitting: Emergency Medicine

## 2014-09-22 DIAGNOSIS — R0602 Shortness of breath: Secondary | ICD-10-CM | POA: Diagnosis not present

## 2014-09-22 DIAGNOSIS — Z888 Allergy status to other drugs, medicaments and biological substances status: Secondary | ICD-10-CM | POA: Diagnosis not present

## 2014-09-22 DIAGNOSIS — Z66 Do not resuscitate: Secondary | ICD-10-CM | POA: Diagnosis present

## 2014-09-22 DIAGNOSIS — Z87440 Personal history of urinary (tract) infections: Secondary | ICD-10-CM

## 2014-09-22 DIAGNOSIS — R0902 Hypoxemia: Secondary | ICD-10-CM | POA: Diagnosis present

## 2014-09-22 DIAGNOSIS — R011 Cardiac murmur, unspecified: Secondary | ICD-10-CM | POA: Diagnosis present

## 2014-09-22 DIAGNOSIS — R911 Solitary pulmonary nodule: Secondary | ICD-10-CM | POA: Diagnosis present

## 2014-09-22 DIAGNOSIS — I2699 Other pulmonary embolism without acute cor pulmonale: Secondary | ICD-10-CM | POA: Diagnosis not present

## 2014-09-22 DIAGNOSIS — B952 Enterococcus as the cause of diseases classified elsewhere: Secondary | ICD-10-CM | POA: Diagnosis present

## 2014-09-22 DIAGNOSIS — I252 Old myocardial infarction: Secondary | ICD-10-CM | POA: Diagnosis not present

## 2014-09-22 DIAGNOSIS — E780 Pure hypercholesterolemia: Secondary | ICD-10-CM | POA: Diagnosis present

## 2014-09-22 DIAGNOSIS — Z8249 Family history of ischemic heart disease and other diseases of the circulatory system: Secondary | ICD-10-CM

## 2014-09-22 DIAGNOSIS — Z7901 Long term (current) use of anticoagulants: Secondary | ICD-10-CM | POA: Diagnosis not present

## 2014-09-22 DIAGNOSIS — Z79899 Other long term (current) drug therapy: Secondary | ICD-10-CM | POA: Diagnosis not present

## 2014-09-22 DIAGNOSIS — Z8673 Personal history of transient ischemic attack (TIA), and cerebral infarction without residual deficits: Secondary | ICD-10-CM

## 2014-09-22 DIAGNOSIS — Z882 Allergy status to sulfonamides status: Secondary | ICD-10-CM | POA: Diagnosis not present

## 2014-09-22 DIAGNOSIS — M1990 Unspecified osteoarthritis, unspecified site: Secondary | ICD-10-CM | POA: Diagnosis present

## 2014-09-22 DIAGNOSIS — I10 Essential (primary) hypertension: Secondary | ICD-10-CM | POA: Diagnosis present

## 2014-09-22 DIAGNOSIS — N3020 Other chronic cystitis without hematuria: Secondary | ICD-10-CM | POA: Diagnosis present

## 2014-09-22 DIAGNOSIS — I739 Peripheral vascular disease, unspecified: Secondary | ICD-10-CM | POA: Diagnosis present

## 2014-09-22 DIAGNOSIS — E039 Hypothyroidism, unspecified: Secondary | ICD-10-CM | POA: Diagnosis present

## 2014-09-22 DIAGNOSIS — Z881 Allergy status to other antibiotic agents status: Secondary | ICD-10-CM | POA: Diagnosis not present

## 2014-09-22 DIAGNOSIS — R4182 Altered mental status, unspecified: Secondary | ICD-10-CM | POA: Diagnosis not present

## 2014-09-22 DIAGNOSIS — E785 Hyperlipidemia, unspecified: Secondary | ICD-10-CM | POA: Diagnosis present

## 2014-09-22 DIAGNOSIS — R0600 Dyspnea, unspecified: Secondary | ICD-10-CM

## 2014-09-22 DIAGNOSIS — N3000 Acute cystitis without hematuria: Secondary | ICD-10-CM | POA: Diagnosis not present

## 2014-09-22 DIAGNOSIS — N390 Urinary tract infection, site not specified: Secondary | ICD-10-CM | POA: Diagnosis not present

## 2014-09-22 DIAGNOSIS — R918 Other nonspecific abnormal finding of lung field: Secondary | ICD-10-CM | POA: Diagnosis not present

## 2014-09-22 DIAGNOSIS — R0689 Other abnormalities of breathing: Secondary | ICD-10-CM | POA: Diagnosis not present

## 2014-09-22 DIAGNOSIS — Z79891 Long term (current) use of opiate analgesic: Secondary | ICD-10-CM | POA: Diagnosis not present

## 2014-09-22 DIAGNOSIS — M6281 Muscle weakness (generalized): Secondary | ICD-10-CM | POA: Diagnosis not present

## 2014-09-22 DIAGNOSIS — J309 Allergic rhinitis, unspecified: Secondary | ICD-10-CM | POA: Diagnosis not present

## 2014-09-22 DIAGNOSIS — N3941 Urge incontinence: Secondary | ICD-10-CM | POA: Diagnosis present

## 2014-09-22 DIAGNOSIS — Z880 Allergy status to penicillin: Secondary | ICD-10-CM | POA: Diagnosis not present

## 2014-09-22 DIAGNOSIS — E538 Deficiency of other specified B group vitamins: Secondary | ICD-10-CM | POA: Diagnosis not present

## 2014-09-22 DIAGNOSIS — D689 Coagulation defect, unspecified: Secondary | ICD-10-CM | POA: Diagnosis present

## 2014-09-22 DIAGNOSIS — Z792 Long term (current) use of antibiotics: Secondary | ICD-10-CM | POA: Diagnosis not present

## 2014-09-22 DIAGNOSIS — R05 Cough: Secondary | ICD-10-CM | POA: Diagnosis not present

## 2014-09-22 LAB — URINALYSIS COMPLETE WITH MICROSCOPIC (ARMC ONLY)
BILIRUBIN URINE: NEGATIVE
BILIRUBIN URINE: NEGATIVE
Bacteria, UA: NONE SEEN
GLUCOSE, UA: NEGATIVE mg/dL
GLUCOSE, UA: NEGATIVE mg/dL
KETONES UR: NEGATIVE mg/dL
Ketones, ur: NEGATIVE mg/dL
NITRITE: NEGATIVE
Nitrite: NEGATIVE
PH: 6 (ref 5.0–8.0)
Protein, ur: NEGATIVE mg/dL
Protein, ur: 30 mg/dL — AB
SPECIFIC GRAVITY, URINE: 1.006 (ref 1.005–1.030)
Specific Gravity, Urine: >1.06 — ABNORMAL HIGH (ref 1.005–1.030)
pH: 6 (ref 5.0–8.0)

## 2014-09-22 LAB — COMPREHENSIVE METABOLIC PANEL
ALK PHOS: 74 U/L (ref 38–126)
ALT: 21 U/L (ref 14–54)
ANION GAP: 8 (ref 5–15)
AST: 46 U/L — ABNORMAL HIGH (ref 15–41)
Albumin: 3.7 g/dL (ref 3.5–5.0)
BUN: 22 mg/dL — ABNORMAL HIGH (ref 6–20)
CALCIUM: 9.4 mg/dL (ref 8.9–10.3)
CO2: 25 mmol/L (ref 22–32)
Chloride: 105 mmol/L (ref 101–111)
Creatinine, Ser: 0.79 mg/dL (ref 0.44–1.00)
GFR calc non Af Amer: >60 mL/min (ref >60)
Glucose, Bld: 101 mg/dL — ABNORMAL HIGH (ref 65–99)
POTASSIUM: 4 mmol/L (ref 3.5–5.1)
SODIUM: 138 mmol/L (ref 135–145)
Total Bilirubin: 1 mg/dL (ref 0.3–1.2)
Total Protein: 7 g/dL (ref 6.5–8.1)

## 2014-09-22 LAB — CBC WITH DIFFERENTIAL/PLATELET
Basophils Absolute: 0 10*3/uL (ref 0–0.1)
Basophils Relative: 0 %
EOS ABS: 0 10*3/uL (ref 0–0.7)
EOS PCT: 0 %
HCT: 40.7 % (ref 35.0–47.0)
HEMOGLOBIN: 13.5 g/dL (ref 12.0–16.0)
LYMPHS ABS: 0.4 10*3/uL — AB (ref 1.0–3.6)
Lymphocytes Relative: 2 %
MCH: 31 pg (ref 26.0–34.0)
MCHC: 33.2 g/dL (ref 32.0–36.0)
MCV: 93.5 fL (ref 80.0–100.0)
MONO ABS: 0.9 10*3/uL (ref 0.2–0.9)
MONOS PCT: 5 %
Neutro Abs: 18.9 10*3/uL — ABNORMAL HIGH (ref 1.4–6.5)
Neutrophils Relative %: 93 %
PLATELETS: 293 10*3/uL (ref 150–440)
RBC: 4.35 MIL/uL (ref 3.80–5.20)
RDW: 13.6 % (ref 11.5–14.5)
WBC: 20.2 10*3/uL — ABNORMAL HIGH (ref 3.6–11.0)

## 2014-09-22 LAB — TROPONIN I

## 2014-09-22 LAB — LIPASE, BLOOD: Lipase: 28 U/L (ref 22–51)

## 2014-09-22 MED ORDER — APIXABAN 5 MG PO TABS
10.0000 mg | ORAL_TABLET | Freq: Two times a day (BID) | ORAL | Status: DC
  Administered 2014-09-22 – 2014-09-24 (×5): 10 mg via ORAL
  Filled 2014-09-22 (×7): qty 2

## 2014-09-22 MED ORDER — DOCUSATE SODIUM 100 MG PO CAPS
100.0000 mg | ORAL_CAPSULE | Freq: Two times a day (BID) | ORAL | Status: DC | PRN
Start: 2014-09-22 — End: 2014-09-24
  Administered 2014-09-24: 08:00:00 100 mg via ORAL
  Filled 2014-09-22: qty 1

## 2014-09-22 MED ORDER — DEXTROSE 5 % IV SOLN
500.0000 mg | Freq: Once | INTRAVENOUS | Status: AC
  Administered 2014-09-22: 500 mg via INTRAVENOUS
  Filled 2014-09-22: qty 500

## 2014-09-22 MED ORDER — VANCOMYCIN HCL IN DEXTROSE 750-5 MG/150ML-% IV SOLN
750.0000 mg | INTRAVENOUS | Status: DC
  Administered 2014-09-23: 750 mg via INTRAVENOUS
  Filled 2014-09-22 (×2): qty 150

## 2014-09-22 MED ORDER — LEVOTHYROXINE SODIUM 50 MCG PO TABS
25.0000 ug | ORAL_TABLET | Freq: Every day | ORAL | Status: DC
  Administered 2014-09-23 – 2014-09-24 (×2): 25 ug via ORAL
  Filled 2014-09-22 (×2): qty 1

## 2014-09-22 MED ORDER — ENSURE ENLIVE PO LIQD
237.0000 mL | Freq: Two times a day (BID) | ORAL | Status: DC
  Administered 2014-09-22 – 2014-09-24 (×4): 237 mL via ORAL

## 2014-09-22 MED ORDER — ACETAMINOPHEN 325 MG PO TABS
325.0000 mg | ORAL_TABLET | Freq: Four times a day (QID) | ORAL | Status: DC | PRN
  Administered 2014-09-23 – 2014-09-24 (×2): 325 mg via ORAL
  Filled 2014-09-22 (×2): qty 1

## 2014-09-22 MED ORDER — CYANOCOBALAMIN 1000 MCG/ML IJ SOLN
1000.0000 ug | INTRAMUSCULAR | Status: DC
  Filled 2014-09-22: qty 1

## 2014-09-22 MED ORDER — VANCOMYCIN HCL IN DEXTROSE 1-5 GM/200ML-% IV SOLN
1000.0000 mg | INTRAVENOUS | Status: AC
  Administered 2014-09-22: 1000 mg via INTRAVENOUS
  Filled 2014-09-22: qty 200

## 2014-09-22 MED ORDER — ENOXAPARIN SODIUM 60 MG/0.6ML ~~LOC~~ SOLN
50.0000 mg | Freq: Once | SUBCUTANEOUS | Status: AC
  Administered 2014-09-22: 50 mg via SUBCUTANEOUS
  Filled 2014-09-22: qty 0.6

## 2014-09-22 MED ORDER — HYDROCODONE-ACETAMINOPHEN 5-325 MG PO TABS
1.0000 | ORAL_TABLET | Freq: Three times a day (TID) | ORAL | Status: DC | PRN
  Administered 2014-09-22: 18:00:00 1 via ORAL
  Filled 2014-09-22: qty 1

## 2014-09-22 MED ORDER — AMLODIPINE BESYLATE 5 MG PO TABS
5.0000 mg | ORAL_TABLET | Freq: Every day | ORAL | Status: DC
  Administered 2014-09-23 – 2014-09-24 (×2): 5 mg via ORAL
  Filled 2014-09-22 (×2): qty 1

## 2014-09-22 MED ORDER — METOPROLOL SUCCINATE ER 25 MG PO TB24
12.5000 mg | ORAL_TABLET | ORAL | Status: DC
  Administered 2014-09-23 – 2014-09-24 (×2): 12.5 mg via ORAL
  Filled 2014-09-22 (×2): qty 1

## 2014-09-22 MED ORDER — PREGABALIN 50 MG PO CAPS
50.0000 mg | ORAL_CAPSULE | Freq: Two times a day (BID) | ORAL | Status: DC
  Administered 2014-09-22 – 2014-09-24 (×4): 50 mg via ORAL
  Filled 2014-09-22 (×4): qty 1

## 2014-09-22 MED ORDER — LORATADINE 10 MG PO TABS
10.0000 mg | ORAL_TABLET | Freq: Every day | ORAL | Status: DC
  Administered 2014-09-22 – 2014-09-24 (×3): 10 mg via ORAL
  Filled 2014-09-22 (×3): qty 1

## 2014-09-22 MED ORDER — IOHEXOL 350 MG/ML SOLN
75.0000 mL | Freq: Once | INTRAVENOUS | Status: AC | PRN
  Administered 2014-09-22: 75 mL via INTRAVENOUS

## 2014-09-22 MED ORDER — APIXABAN 5 MG PO TABS: 5.0000 mg | ORAL_TABLET | Freq: Two times a day (BID) | ORAL | Status: DC

## 2014-09-22 MED ORDER — SENNA 8.6 MG PO TABS
1.0000 | ORAL_TABLET | Freq: Every day | ORAL | Status: DC | PRN
  Administered 2014-09-22 – 2014-09-24 (×2): 8.6 mg via ORAL
  Filled 2014-09-22 (×2): qty 1

## 2014-09-22 MED ORDER — PRAVASTATIN SODIUM 20 MG PO TABS
40.0000 mg | ORAL_TABLET | Freq: Every evening | ORAL | Status: DC
  Administered 2014-09-22 – 2014-09-23 (×2): 40 mg via ORAL
  Filled 2014-09-22 (×2): qty 2

## 2014-09-22 MED ORDER — NITROFURANTOIN MONOHYD MACRO 100 MG PO CAPS: 100.0000 mg | ORAL_CAPSULE | Freq: Two times a day (BID) | ORAL | Status: DC

## 2014-09-22 NOTE — Plan of Care (Signed)
Problem: Discharge Progression Outcomes Goal: Other Discharge Outcomes/Goals Outcome: Progressing Patient admitted from [facility] with AMS and O2 sat in the 80's Patient weaned off o2 and sats now at 95% on room air  Pain c/o hip and shoulder pain x1 relieved well with prn Norco Laxative given for c/o constipation continue to monitor  Patient is tolerating diet well family at bedside

## 2014-09-22 NOTE — ED Notes (Signed)
Pt to ED via EMS from [facility], staff reported to EMS that pt had some AMS when she woke up this am and o2 sat was in the 80's, EMS reports that got a  RA sat of 94% on RA, upon arrival to ED pt's o2 sat was 88% on RA, placed on 2 liters and increased to 93%, pt A&O x 4 with no complaints at this time, pt had PICC line removed yesterday, pt has IV SL in left arm

## 2014-09-22 NOTE — H&P (Signed)
[place] [facility] [place] NAME: Krista Ellis    MR#:  [id number]  DATE OF BIRTH:  21-Jun-1930  DATE OF ADMISSION:  09/22/2014  PRIMARY CARE PHYSICIAN: [name], MD   REQUESTING/REFERRING PHYSICIAN: Quale  CHIEF COMPLAINT:   Chief Complaint  Patient presents with  . Altered Mental Status    HISTORY OF PRESENT ILLNESS: Krista Ellis  is a 81 y.o. female with a known history of hypertension, hyperlipidemia, myocardial infarction, hypothyroidism, cerebrovascular accident, recurrent UTIs, recent fall a few weeks ago causing pubic fracture and has functional decline since then he has been in and out of hospital because of UTI after that and was recently sent to rehabilitation Center after admission last week in the hospital for UTI. She was discharged to [facility] last week with PICC line and IV vancomycin therapy to treat her for UTI with enterococci, she finished her vancomycin course today and PICC line was taken out the nursing home. She was more short of breath and so she was sent to hospital by [facility] as she was found hypoxic. In hospital CT scan of the chest with angiogram showed there is a pulmonary embolism and also lung nodules which patient was already known about are slightly bigger in appearance than before so she is given his admission to hospitalist team. On further questioning she denies any cough or chest pain and she denies any sputum production.  PAST MEDICAL HISTORY:   Past Medical History  Diagnosis Date  . Hypertension   . Hyperlipidemia   . Arthritis   . Hemihypertrophy   . Myocardial infarction   . Thyroid disease   . CVA (cerebral infarction) 2003  . Peripheral vascular disease     s/p stent right leg  . Pulmonary nodule     stable on Chest CT March 2014, Followed at [facility]  . Murmur, cardiac   . Chronic cystitis   . Hematuria   . Bleeding disorder   . Recurrent UTI   . Gross hematuria    . Urinary frequency   . Dysuria   . Sensory urge incontinence   . History of kidney stones     PAST SURGICAL HISTORY:  Past Surgical History  Procedure Laterality Date  . Abdominal hysterectomy  1982  . Total hip arthroplasty      right  . Spine surgery      steel rod in back  . Tonsillectomy    . Left rotator cuff      repair  . Total knee arthroplasty Left   . Hip surgery  2007    replaced ball in right hip  . Cardiac surgery      2 failed stents in right leg  . Stent placement leg Right 2015  . Carpal tunnel release    . Appendectomy    . Ruptured disc      SOCIAL HISTORY:  Social History  Substance Use Topics  . Smoking status: Never Smoker   . Smokeless tobacco: Never Used  . Alcohol Use: No    FAMILY HISTORY:  Family History  Problem Relation Age of Onset  . Hypertension Mother   . Transient ischemic attack Mother   . Kidney disease Neg Hx   . Bladder Cancer Neg Hx   . Prostate cancer Neg Hx     DRUG ALLERGIES:  Allergies  Allergen Reactions  . Sulfa Antibiotics Hives and Itching  . Ciprofloxacin Hives and Itching  . Doxycycline Hives  and Itching  . Ferrous Sulfate Hives and Itching  . Levofloxacin Hives and Itching  . Penicillins Hives and Itching    REVIEW OF SYSTEMS:   CONSTITUTIONAL: No fever, positive for fatigue or weakness.  EYES: No blurred or double vision.  EARS, NOSE, AND THROAT: No tinnitus or ear pain.  RESPIRATORY: No cough,positive for shortness of breath,no wheezing or hemoptysis.  CARDIOVASCULAR: No chest pain, orthopnea, edema.  GASTROINTESTINAL: No nausea, vomiting, diarrhea or abdominal pain.  GENITOURINARY: No dysuria, hematuria.  ENDOCRINE: No polyuria, nocturia,  HEMATOLOGY: No anemia, easy bruising or bleeding SKIN: No rash or lesion. MUSCULOSKELETAL: No joint pain or arthritis.   NEUROLOGIC: No tingling, numbness, weakness.  PSYCHIATRY: No anxiety or depression.   MEDICATIONS AT HOME:  Prior to Admission  medications   Medication Sig Start Date End Date Taking? Authorizing Provider  acetaminophen (TYLENOL) 325 MG tablet Take 325 mg by mouth every 6 (six) hours as needed for mild pain or headache.   Yes Historical Provider, MD  amLODipine (NORVASC) 5 MG tablet Take 5 mg by mouth daily.   Yes Historical Provider, MD  cyanocobalamin (,VITAMIN B-12,) 1000 MCG/ML injection Inject 1,000 mcg into the muscle every 30 (thirty) days.    Yes Historical Provider, MD  docusate sodium (COLACE) 100 MG capsule Take 1 capsule (100 mg total) by mouth 2 (two) times daily as needed for mild constipation. 09/14/14  Yes Theodoro Grist, MD  feeding supplement, ENSURE ENLIVE, (ENSURE ENLIVE) LIQD Take 237 mLs by mouth 2 (two) times daily after a meal. 05/24/14  Yes Maryann Mikhail, DO  levothyroxine (SYNTHROID, LEVOTHROID) 25 MCG tablet TAKE 1 TABLET (25 MCG TOTAL) BY MOUTH DAILY. Patient taking differently: Take 25 mcg by mouth daily before breakfast.  07/04/14  Yes Jackolyn Confer, MD  loratadine (CLARITIN) 10 MG tablet Take 1 tablet (10 mg total) by mouth daily. 05/24/14  Yes Maryann Mikhail, DO  metoprolol succinate (TOPROL-XL) 25 MG 24 hr tablet Take 12.5 mg by mouth every morning.    Yes Historical Provider, MD  nitrofurantoin, macrocrystal-monohydrate, (MACROBID) 100 MG capsule Take 100 mg by mouth 2 (two) times daily.   Yes Historical Provider, MD  oxyCODONE-acetaminophen (ROXICET) 5-325 MG per tablet Take 1 tablet by mouth every 6 (six) hours as needed. 09/14/14  Yes Theodoro Grist, MD  pravastatin (PRAVACHOL) 40 MG tablet Take 1 tablet (40 mg total) by mouth every evening. 07/04/14  Yes Jackolyn Confer, MD  pregabalin (LYRICA) 50 MG capsule Take 1 capsule (50 mg total) by mouth 2 (two) times daily. 07/04/14  Yes Melvenia Beam, MD  sodium chloride 0.9 % injection Inject 10 mLs into the vein every 12 (twelve) hours. 09/14/14  Yes Theodoro Grist, MD  vancomycin 500 mg in sodium chloride 0.9 % 100 mL Inject 500 mg into the  vein every 12 (twelve) hours. 09/14/14  Yes Theodoro Grist, MD  HYDROcodone-acetaminophen (NORCO/VICODIN) 5-325 MG per tablet Take 1 tablet by mouth 3 (three) times daily as needed for moderate pain. 09/14/14   Theodoro Grist, MD  senna (SENOKOT) 8.6 MG TABS tablet Take 1 tablet (8.6 mg total) by mouth daily as needed for mild constipation. 09/14/14   Theodoro Grist, MD      PHYSICAL EXAMINATION:   VITAL SIGNS: Blood pressure 123/55, pulse 93, temperature 100.1 F (37.8 C), temperature source Oral, resp. rate 18, height 5\' 2"  (1.575 m), weight 51.71 kg (114 lb), SpO2 88 %.  GENERAL:  81 y.o.-year-old patient lying in the bed with no  acute distress.  EYES: Pupils equal, round, reactive to light and accommodation. No scleral icterus. Extraocular muscles intact.  HEENT: Head atraumatic, normocephalic. Oropharynx and nasopharynx clear.  NECK:  Supple, no jugular venous distention. No thyroid enlargement, no tenderness.  LUNGS: Normal breath sounds bilaterally, no wheezing, rales,rhonchi or crepitation. No use of accessory muscles of respiration.  CARDIOVASCULAR: S1, S2 normal. Systolic ejection murmurs present.  ABDOMEN: Soft, nontender, nondistended. Bowel sounds present. No organomegaly or mass.  EXTREMITIES: No pedal edema, cyanosis, or clubbing. Left side extrimity looks significantly more bigger than right- she said- she was born like that. NEUROLOGIC: Cranial nerves II through XII are intact. Muscle strength 4/5 in all extremities. Sensation intact. Gait not checked.  PSYCHIATRIC: The patient is alert and oriented x 3.  SKIN: No obvious rash, lesion, or ulcer.   LABORATORY PANEL:   CBC  Recent Labs Lab 09/22/14 1042  WBC 20.2*  HGB 13.5  HCT 40.7  PLT 293  MCV 93.5  MCH 31.0  MCHC 33.2  RDW 13.6  LYMPHSABS 0.4*  MONOABS 0.9  EOSABS 0.0  BASOSABS 0.0    ------------------------------------------------------------------------------------------------------------------  Chemistries   Recent Labs Lab 09/16/14 1355 09/22/14 1042  NA  --  138  K  --  4.0  CL  --  105  CO2  --  25  GLUCOSE  --  101*  BUN  --  22*  CREATININE 0.74 0.79  CALCIUM  --  9.4  AST  --  46*  ALT  --  21  ALKPHOS  --  74  BILITOT  --  1.0   ------------------------------------------------------------------------------------------------------------------ estimated creatinine clearance is 42.9 mL/min (by C-G formula based on Cr of 0.79). ------------------------------------------------------------------------------------------------------------------ No results for input(s): TSH, T4TOTAL, T3FREE, THYROIDAB in the last 72 hours.  Invalid input(s): FREET3   Coagulation profile No results for input(s): INR, PROTIME in the last 168 hours. ------------------------------------------------------------------------------------------------------------------- No results for input(s): DDIMER in the last 72 hours. -------------------------------------------------------------------------------------------------------------------  Cardiac Enzymes  Recent Labs Lab 09/22/14 1042  TROPONINI <0.03   ------------------------------------------------------------------------------------------------------------------ Invalid input(s): POCBNP  ---------------------------------------------------------------------------------------------------------------  Urinalysis    Component Value Date/Time   COLORURINE YELLOW* 09/22/2014 1410   COLORURINE Yellow 04/18/2014 1455   APPEARANCEUR CLOUDY* 09/22/2014 1410   APPEARANCEUR Cloudy 04/18/2014 1455   LABSPEC >1.060* 09/22/2014 1410   LABSPEC 1.019 04/18/2014 1455   PHURINE 6.0 09/22/2014 1410   PHURINE 5.0 04/18/2014 1455   GLUCOSEU NEGATIVE 09/22/2014 1410   GLUCOSEU Negative 04/18/2014 1455   HGBUR 1+* 09/22/2014  1410   HGBUR 3+ 04/18/2014 1455   BILIRUBINUR NEGATIVE 09/22/2014 1410   BILIRUBINUR Negative 09/08/2014 0843   BILIRUBINUR Negative 04/18/2014 1455   KETONESUR NEGATIVE 09/22/2014 1410   KETONESUR Negative 04/18/2014 1455   PROTEINUR 30* 09/22/2014 1410   PROTEINUR 30 mg/dL 04/18/2014 1455   UROBILINOGEN 1.0 05/20/2014 0225   NITRITE NEGATIVE 09/22/2014 1410   NITRITE Negative 09/08/2014 0843   NITRITE Negative 04/18/2014 1455   LEUKOCYTESUR 3+* 09/22/2014 1410   LEUKOCYTESUR 3+* 09/08/2014 0843   LEUKOCYTESUR 2+ 04/18/2014 1455     RADIOLOGY: Dg Chest 2 View  09/22/2014   CLINICAL DATA:  Decreased oxygen saturation.  Cough and dyspnea  EXAM: CHEST  2 VIEW  COMPARISON:  September 12, 2014  FINDINGS: There is mild atelectasis in the right base. Lungs elsewhere clear. Heart size and pulmonary vascularity are normal. No adenopathy. There is extensive arthropathy in the left shoulder region. There is degenerative change in the thoracic spine.  IMPRESSION: No edema  or consolidation.  Mild atelectasis right base.   Electronically Signed   By: Lowella Grip III M.D.   On: 09/22/2014 10:45   Ct Angio Chest Pe W/cm &/or Wo Cm  09/22/2014   CLINICAL DATA:  Dyspnea.  EXAM: CT ANGIOGRAPHY CHEST WITH CONTRAST  TECHNIQUE: Multidetector CT imaging of the chest was performed using the standard protocol during bolus administration of intravenous contrast. Multiplanar CT image reconstructions and MIPs were obtained to evaluate the vascular anatomy.  CONTRAST:  20mL OMNIPAQUE IOHEXOL 350 MG/ML SOLN  COMPARISON:  Chest radiograph dated 09/22/2014, chest CT dated 09/25/2013.  FINDINGS: There is nearly occlusive pulmonary embolus within the takeoff of the pulmonary artery supplying the superior segment of right lower lobe. There is no definite evidence of right heart strain, however there is mild straightening of the interventricular septum.  The heart is mildly enlarged. Coronary artery disease and calcifications  of the aortic valve annulus are noted. There are mildly enlarged mediastinal and hilar lymph nodes most notably left aortopulmonary window node measuring 9 mm in short axis. There is no evidence of significant focal lung parenchymal consolidation, pleural effusion or pneumothorax. Mosaic attenuation of the lung parenchyma is noted.  There are several less than 7 mm bilateral pulmonary nodules. Mild enlargement of the central right upper lobe nodule is seen which now measures 7 mm. There is also a more prominent 4 mm nodule in the inferior portion of the right upper lobe. Stable 3-4 mm nodules are seen within the right middle lobe, right upper lobe, right lower lobe. Subpleural nodular thickening in the left lower lobe is also stable. Ground-glass paraseptal 6 mm nodule in the lingula, and more centrally located soft tissue 6 mm nodule in the lingula are also stable. There is new subpleural nodular thickening in the left lung apex, and superior-most portion of the superior segment of the left lower lobe.  No axillary lymphadenopathy is seen. The visualized portions of the thyroid gland demonstrate 1.2 cm nodule within the inferior portion of the right thyroid gland.  The visualized portions of the liver and spleen are unremarkable.  The visualized portions of the pancreas, stomach, adrenal glands are within normal limits. There are bilateral hypoattenuated renal nodules, too small to be accurately characterize but likely representing cysts. The gallbladder is distended measuring up to 4 cm and cross-section.  There is sclerotic appearance of T12 vertebral body, with mild height loss, new from 09/25/2013.  Review of the MIP images confirms the above findings.  IMPRESSION: Nearly occlusive right lower lobe upper segment pulmonary embolus. No definite evidence of right heart strain.  Numerous bilateral pulmonary nodules, with notable mild increase in the size of right upper lobe nodules. Metastatic disease cannot be  excluded.  Indeterminate mediastinal lymph nodes.  Mosaic attenuation of the lung parenchyma, which may be due to areal hypoperfusion or hypoventilation  1.2 cm right thyroid lobe nodule.  Probable bilateral renal cysts.  Prominent but subpathologic distention of the gallbladder.  Diffusely sclerotic appearance of T12 vertebral body with mild height loss. This may be due to degenerative changes, however its asymmetry from the remainder of the visualized thoracic vertebral bodies make metastatic disease a consideration.  Key findings were called by telephone at the time of interpretation on 09/22/2014 at 12:44 pm to Dr. Delman Kitten , who verbally acknowledged these results.   Electronically Signed   By: Fidela Salisbury M.D.   On: 09/22/2014 13:21    IMPRESSION AND PLAN:  * Pulmonary embolism  Most likely the cause for this is her decreased functional status because of recent Pelvic fracture.  She has multiple lung nodules which is chronic and she had follow-up in oncology clinic for that in the past.  Some of the nodules looks slightly bigger as per the CT scan report from malignancy cannot be ruled out as underlying cause for her pulmonary embolism.  She denies any family history of blood clots, she denies any major bleeding GI or CNS bleeding in the past.  ER give Lovenox 1 dose, I will start on Eliquis.   * Lung nodules  I will call oncology consult for further management of this issue.  * UTI  She is recently treated for enterococcal UTI which were resistant to most of the routine antibiotics and she was finished with a course of vancomycin IV.  As she again have numerous WBCs in her urine which was much better yesterday, and she has elevated white cell count with low-grade fever, I will resume vancomycin for now and will wait for urine culture.  * Hypothyroidism continue levothyroxine  * Hypertension continue metoprolol and amlodipine  * Hypercholesterolemia continue pravastatin  *  Generalized weakness and status post pelvic fracture  Physical therapy evaluation and possible rehabilitation placement again as she came from rehabilitation.  All the records are reviewed and case discussed with ED provider. Management plans discussed with the patient, family and they are in agreement.  CODE STATUS: DO NOT RESUSCITATE   TOTAL TIME TAKING CARE OF THIS PATIENT: 50 minutes.    Vaughan Basta M.D on 09/22/2014   Between 7am to 6pm - Pager - [phone]  After 6pm go to [email or web address] - password [email or web address] [facility]  Office  [phone]  CC: Primary care physician; [name], MD

## 2014-09-22 NOTE — Progress Notes (Signed)
ANTIBIOTIC CONSULT NOTE - INITIAL  Pharmacy Consult for Vancomycin Indication: UTI  Allergies  Allergen Reactions  . Sulfa Antibiotics Hives and Itching  . Ciprofloxacin Hives and Itching  . Doxycycline Hives and Itching  . Ferrous Sulfate Hives and Itching  . Levofloxacin Hives and Itching  . Penicillins Hives and Itching    Patient Measurements: Height: 5\' 2"  (157.5 cm) Weight: 114 lb (51.71 kg) IBW/kg (Calculated) : 50.1 Adjusted Body Weight: 50.7 kg   Vital Signs: Temp: 100.1 F (37.8 C) (09/02 0954) Temp Source: Oral (09/02 0954) BP: 121/52 mmHg (09/02 1525) Pulse Rate: 92 (09/02 1525) Intake/Output from previous day:   Intake/Output from this shift:    Labs:  Recent Labs  09/22/14 1042  WBC 20.2*  HGB 13.5  PLT 293  CREATININE 0.79   Estimated Creatinine Clearance: 42.9 mL/min (by C-G formula based on Cr of 0.79). No results for input(s): VANCOTROUGH, VANCOPEAK, VANCORANDOM, GENTTROUGH, GENTPEAK, GENTRANDOM, TOBRATROUGH, TOBRAPEAK, TOBRARND, AMIKACINPEAK, AMIKACINTROU, AMIKACIN in the last 72 hours.   Microbiology: Recent Results (from the past 720 hour(s))  Microscopic Examination     Status: Abnormal   Collection Time: 09/01/14  4:24 PM  Result Value Ref Range Status   WBC, UA >30 (H) 0 -  5 /hpf Final   RBC, UA >30 (H) 0 -  2 /hpf Final   Epithelial Cells (non renal) None seen 0 - 10 /hpf Final   Crystals Present (A) N/A Final   Crystal Type Amorphous Sediment N/A Final   Bacteria, UA Many (A) None seen/Few Final  CULTURE, URINE COMPREHENSIVE     Status: Abnormal   Collection Time: 09/01/14  4:25 PM  Result Value Ref Range Status   Urine Culture, Comprehensive Final report (A)  Final   Result 1 Enterococcus species (A)  Final    Comment: Greater than 100,000 colony forming units per mL Note: this isolate is vancomycin-susceptible. This information is provided for epidemiologic purposes only: vancomycin is not among the antibiotics recommended  for therapy of urinary tract infections caused by Enterococcus.    Result 2 Comment  Final    Comment: Mixed urogenital flora 10,000-25,000 colony forming units per mL    ANTIMICROBIAL SUSCEPTIBILITY Comment  Final    Comment:       ** S = Susceptible; I = Intermediate; R = Resistant **                    P = Positive; N = Negative             MICS are expressed in micrograms per mL    Antibiotic                 RSLT#1    RSLT#2    RSLT#3    RSLT#4 Ciprofloxacin                  R Levofloxacin                   R Nitrofurantoin                 S Penicillin                     S Tetracycline                   R Vancomycin                     S  CULTURE, URINE COMPREHENSIVE     Status: None   Collection Time: 09/08/14  8:43 AM  Result Value Ref Range Status   Urine Culture, Comprehensive Final report  Final   Result 1 Comment  Final    Comment: Mixed urogenital flora Greater than 100,000 colony forming units per mL   Microscopic Examination     Status: Abnormal   Collection Time: 09/08/14  8:43 AM  Result Value Ref Range Status   WBC, UA >30 (H) 0 -  5 /hpf Final   RBC, UA >30 (H) 0 -  2 /hpf Final   Epithelial Cells (non renal) None seen 0 - 10 /hpf Final   Bacteria, UA Many (A) None seen/Few Final  Culture, blood (routine x 2)     Status: None   Collection Time: 09/10/14  5:00 PM  Result Value Ref Range Status   Specimen Description BLOOD LEFT ARM  Final   Special Requests   Final    BOTTLES DRAWN AEROBIC AND ANAEROBIC  AER 5CC ANA 3CC   Culture NO GROWTH 5 DAYS  Final   Report Status 09/15/2014 FINAL  Final  Culture, blood (routine x 2)     Status: None   Collection Time: 09/10/14  5:14 PM  Result Value Ref Range Status   Specimen Description BLOOD RIGHT ARM  Final   Special Requests   Final    BOTTLES DRAWN AEROBIC AND ANAEROBIC  AER 5CC ANA [place]   Culture NO GROWTH 5 DAYS  Final   Report Status 09/15/2014 FINAL  Final  Urine culture     Status: None   Collection  Time: 09/10/14  5:31 PM  Result Value Ref Range Status   Specimen Description URINE, RANDOM  Final   Special Requests NONE  Final   Culture MULTIPLE SPECIES PRESENT, SUGGEST RECOLLECTION  Final   Report Status 09/12/2014 FINAL  Final    Medical History: Past Medical History  Diagnosis Date  . Hypertension   . Hyperlipidemia   . Arthritis   . Hemihypertrophy   . Myocardial infarction   . Thyroid disease   . CVA (cerebral infarction) 2003  . Peripheral vascular disease     s/p stent right leg  . Pulmonary nodule     stable on Chest CT March 2014, Followed at [facility]  . Murmur, cardiac   . Chronic cystitis   . Hematuria   . Bleeding disorder   . Recurrent UTI   . Gross hematuria   . Urinary frequency   . Dysuria   . Sensory urge incontinence   . History of kidney stones     Medications:   (Not in a hospital admission) Assessment: CrCl = 41.9 ml/min Ke = 0.04 hr-1 T1/2 = 17.3 hrs Vd = 36.2 L   Goal of Therapy:  Vancomycin trough level 10-15 mcg/ml  Plan:  Expected duration 7 days with resolution of temperature and/or normalization of WBC  Vancomycin 1 gm IV X 1 given in ED on 9/2 @ 15:00. Vancomycin 750 mg IV Q24H ordered to start 9/3 @ 17:00, ~ 26 hrs after 1st dose (stacked dosing). This pt will reach Css by 9/6 @ 3:00. Will draw 1st trough on 9/5 @ 16:30, which will be close to Css.   Breslyn Abdo D 09/22/2014,3:37 PM

## 2014-09-22 NOTE — ED Provider Notes (Signed)
[facility] Emergency Department Provider Note  ____________________________________________  Time seen: Approximately 10:12 AM  I have reviewed the triage vital signs and the nursing notes.   HISTORY  Chief Complaint Altered Mental Status    HPI Krista Ellis is a 81 y.o. female recent history of resistant urinary tract infection, pneumonia, pelvic fracture is been at nursing facility referred here for appearing "gray" and low oxygen. She is notably been on vancomycin and Macrobid. She down roughly a week plus of vancomycin, started Macrobid yesterday at the request Dr. Ola Spurr.  She reports that she feels some mild shortness of breath and ongoing cough. She's not any pain. She does feel hot, and does that she's had a fever. She reports feeling weak and tired for the last several days but no acute changes. Her daughter is with her, both are very amicable and report that they're concerned about ongoing infection.     Past Medical History  Diagnosis Date  . Hypertension   . Hyperlipidemia   . Arthritis   . Hemihypertrophy   . Myocardial infarction   . Thyroid disease   . CVA (cerebral infarction) 2003  . Peripheral vascular disease     s/p stent right leg  . Pulmonary nodule     stable on Chest CT March 2014, Followed at [facility]  . Murmur, cardiac   . Chronic cystitis   . Hematuria   . Bleeding disorder   . Recurrent UTI   . Gross hematuria   . Urinary frequency   . Dysuria   . Sensory urge incontinence   . History of kidney stones     Patient Active Problem List   Diagnosis Date Noted  . UTI (urinary tract infection) 09/14/2014  . Enterococcus UTI 09/14/2014  . Generalized weakness 09/14/2014  . Sepsis 09/10/2014  . Open wound of knee, leg (except thigh), and ankle, complicated 17/61/6073  . Hospital discharge follow-up 07/04/2014  . Facial droop   . Malnutrition of moderate degree 05/21/2014  . Seizures   . TIA (transient  ischemic attack) 05/18/2014  . Systolic murmur 71/06/2692  . Gait disturbance 05/15/2014  . History of subdural hematoma   . Subdural hematoma 05/05/2014  . B12 deficiency 03/09/2014  . Recurrent UTI 01/03/2014  . Bilateral hand numbness 01/03/2014  . Nephrolithiasis 11/18/2013  . Recurrent falls 11/18/2013  . Abnormal CT scan, chest 09/01/2013  . Chest wall pain 08/31/2013  . Hematoma and contusion 08/31/2013  . Atherosclerotic peripheral vascular disease 06/01/2013  . Bladder wall hemorrhage 12/10/2012  . Postmenopausal estrogen deficiency 11/30/2012  . Medicare annual wellness visit, subsequent 10/01/2011  . Dyslipidemia 10/01/2011  . Hypertension 10/17/2010  . Hypothyroidism 10/17/2010  . Osteoarthritis 10/17/2010    Past Surgical History  Procedure Laterality Date  . Abdominal hysterectomy  1982  . Total hip arthroplasty      right  . Spine surgery      steel rod in back  . Tonsillectomy    . Left rotator cuff      repair  . Total knee arthroplasty Left   . Hip surgery  2007    replaced ball in right hip  . Cardiac surgery      2 failed stents in right leg  . Stent placement leg Right 2015  . Carpal tunnel release    . Appendectomy    . Ruptured disc      Current Outpatient Rx  Name  Route  Sig  Dispense  Refill  .  acetaminophen (TYLENOL) 325 MG tablet   Oral   Take 325 mg by mouth every 6 (six) hours as needed for mild pain or headache.         Marland Kitchen amLODipine (NORVASC) 5 MG tablet   Oral   Take 5 mg by mouth daily.         . cyanocobalamin (,VITAMIN B-12,) 1000 MCG/ML injection   Intramuscular   Inject 1,000 mcg into the muscle every 30 (thirty) days.          Marland Kitchen docusate sodium (COLACE) 100 MG capsule   Oral   Take 1 capsule (100 mg total) by mouth 2 (two) times daily as needed for mild constipation.   10 capsule   0   . feeding supplement, ENSURE ENLIVE, (ENSURE ENLIVE) LIQD   Oral   Take 237 mLs by mouth 2 (two) times daily after a meal.    237 mL   12   . levothyroxine (SYNTHROID, LEVOTHROID) 25 MCG tablet      TAKE 1 TABLET (25 MCG TOTAL) BY MOUTH DAILY. Patient taking differently: Take 25 mcg by mouth daily before breakfast.    90 tablet   1   . loratadine (CLARITIN) 10 MG tablet   Oral   Take 1 tablet (10 mg total) by mouth daily.         . metoprolol succinate (TOPROL-XL) 25 MG 24 hr tablet   Oral   Take 12.5 mg by mouth every morning.          . nitrofurantoin, macrocrystal-monohydrate, (MACROBID) 100 MG capsule   Oral   Take 100 mg by mouth 2 (two) times daily.         Marland Kitchen oxyCODONE-acetaminophen (ROXICET) 5-325 MG per tablet   Oral   Take 1 tablet by mouth every 6 (six) hours as needed.   20 tablet   0   . pravastatin (PRAVACHOL) 40 MG tablet   Oral   Take 1 tablet (40 mg total) by mouth every evening.   90 tablet   1   . pregabalin (LYRICA) 50 MG capsule   Oral   Take 1 capsule (50 mg total) by mouth 2 (two) times daily.   60 capsule   6   . sodium chloride 0.9 % injection   Intravenous   Inject 10 mLs into the vein every 12 (twelve) hours.   10 mL   20   . vancomycin 500 mg in sodium chloride 0.9 % 100 mL   Intravenous   Inject 500 mg into the vein every 12 (twelve) hours.   16 Dose   0   . HYDROcodone-acetaminophen (NORCO/VICODIN) 5-325 MG per tablet   Oral   Take 1 tablet by mouth 3 (three) times daily as needed for moderate pain.   30 tablet   0   . senna (SENOKOT) 8.6 MG TABS tablet   Oral   Take 1 tablet (8.6 mg total) by mouth daily as needed for mild constipation.   120 each   0     Allergies Sulfa antibiotics; Ciprofloxacin; Doxycycline; Ferrous sulfate; Levofloxacin; and Penicillins  Family History  Problem Relation Age of Onset  . Hypertension Mother   . Transient ischemic attack Mother   . Kidney disease Neg Hx   . Bladder Cancer Neg Hx   . Prostate cancer Neg Hx     Social History Social History  Substance Use Topics  . Smoking status: Never  Smoker   . Smokeless tobacco: Never Used  .  Alcohol Use: No    Review of Systems Constitutional: Fever Eyes: No visual changes. ENT: No sore throat. Cardiovascular: Denies chest pain. Respiratory: Mild shortness of breath, no wheezing. Gastrointestinal: No abdominal pain.  No nausea, no vomiting.  No diarrhea.  No constipation. Genitourinary: Negative for dysuria. Currently on treatment for a urinary tract infection. Musculoskeletal: Negative for back pain. Skin: Negative for rash. Neurological: Negative for headaches, focal weakness or numbness.  10-point ROS otherwise negative.  ____________________________________________   PHYSICAL EXAM:  VITAL SIGNS: ED Triage Vitals  Enc Vitals Group     BP 09/22/14 0954 123/55 mmHg     Pulse Rate 09/22/14 0954 93     Resp 09/22/14 0954 18     Temp 09/22/14 0954 100.1 F (37.8 C)     Temp Source 09/22/14 0954 Oral     SpO2 09/22/14 0944 95 %     Weight 09/22/14 0954 114 lb (51.71 kg)     Height 09/22/14 0954 5\' 2"  (1.575 m)     Head Cir --      Peak Flow --      Pain Score 09/22/14 0955 0     Pain Loc --      Pain Edu? --      Excl. in [place]? --     Constitutional: Alert and oriented. Chronically ill appearing, but in no acute distress. Eyes: Conjunctivae are normal. PERRL. EOMI. Head: Atraumatic. Nose: No congestion/rhinnorhea. Mouth/Throat: Mucous membranes are moist.  Oropharynx non-erythematous. Neck: No stridor.   Cardiovascular: Normal rate, regular rhythm. Grossly normal heart sounds except for a systolic ejection murmur which patient reports is an old murmur.  Good peripheral circulation. Respiratory: Normal respiratory effort.  No retractions. Lungs CTAB except for some mild rales in the left upper lobe. Gastrointestinal: Soft and nontender. No distention. No abdominal bruits. No CVA tenderness. Musculoskeletal: No acute lower extremity edema. No focal neurologic deficits. Neurologic:  Normal speech and language. No  gross focal neurologic deficits are appreciated. No gait instability. Skin:  Skin is warm, dry and intact. No rash noted. Psychiatric: Mood and affect are normal. Speech and behavior are normal.  ____________________________________________   LABS (all labs ordered are listed, but only abnormal results are displayed)  Labs Reviewed  CBC WITH DIFFERENTIAL/PLATELET - Abnormal; Notable for the following:    WBC 20.2 (*)    Neutro Abs 18.9 (*)    Lymphs Abs 0.4 (*)    All other components within normal limits  COMPREHENSIVE METABOLIC PANEL - Abnormal; Notable for the following:    Glucose, Bld 101 (*)    BUN 22 (*)    AST 46 (*)    All other components within normal limits  CULTURE, BLOOD (ROUTINE X 2)  CULTURE, BLOOD (ROUTINE X 2)  URINE CULTURE  LIPASE, BLOOD  TROPONIN I  URINALYSIS COMPLETEWITH MICROSCOPIC ([facility] ONLY)   ____________________________________________  EKG  ED ECG REPORT I, Haydon Dorris, the attending physician, personally viewed and interpreted this ECG.  Date: 09/22/2014 EKG Time: 1 PM Rate: 90 Rhythm: normal sinus rhythm QRS Axis: normal Intervals: normal ST/T Wave abnormalities: normal Conduction Disutrbances: none Narrative Interpretation: unremarkable  ____________________________________________  RADIOLOGY  Nearly occlusive right lower lobe upper segment pulmonary embolus. No definite evidence of right heart strain.  Numerous bilateral pulmonary nodules, with notable mild increase in the size of right upper lobe nodules. Metastatic disease cannot be excluded.  Indeterminate mediastinal lymph nodes.  Mosaic attenuation of the lung parenchyma, which may be due to areal  hypoperfusion or hypoventilation  1.2 cm right thyroid lobe nodule.  Probable bilateral renal cysts.  Prominent but subpathologic distention of the gallbladder.  Diffusely sclerotic appearance of T12 vertebral body with mild height loss. This may be due to degenerative  changes, however its asymmetry from the remainder of the visualized thoracic vertebral bodies make metastatic disease a consideration.  Key findings were called by telephone at the time of interpretation on 09/22/2014 at 12:44 pm to Dr. Delman Kitten , who verbally acknowledged these results. ____________________________________________   PROCEDURES  Procedure(s) performed: None  Critical Care performed: No  ____________________________________________   INITIAL IMPRESSION / ASSESSMENT AND PLAN / ED COURSE  Pertinent labs & imaging results that were available during my care of the patient were reviewed by me and considered in my medical decision making (see chart for details).   Hypoxia, also low-grade fever. Currently on treatment with vancomycin as well as return for any tract infection. Chest x-ray does not demonstrate acute infiltrate, and she would be at risk for pulmonary most him so CT scan was obtained which shows pulmonary embolism along with some associated lymphadenopathy and nodules which are concerning for possible metastatic process.  We'll initiate the patient on Lovenox, I have added azithromycin and will defer to the hospitalist for ongoing management of pulmonary embolus, leukocytosis, and further workup. ____________________________________________   FINAL CLINICAL IMPRESSION(S) / ED DIAGNOSES  Final diagnoses:  Dyspnea  Pulmonary emboli  Hypoxia  Pulmonary nodule      Delman Kitten, MD 09/22/14 1338

## 2014-09-22 NOTE — Progress Notes (Signed)
ANTICOAGULATION CONSULT NOTE - Initial Consult  Pharmacy Consult for Apixaban Indication: pulmonary embolus  Allergies  Allergen Reactions  . Sulfa Antibiotics Hives and Itching  . Ciprofloxacin Hives and Itching  . Doxycycline Hives and Itching  . Ferrous Sulfate Hives and Itching  . Levofloxacin Hives and Itching  . Penicillins Hives and Itching    Patient Measurements: Height: 5\' 2"  (157.5 cm) Weight: 114 lb (51.71 kg) IBW/kg (Calculated) : 50.1 Heparin Dosing Weight:   Vital Signs: Temp: 100.1 F (37.8 C) (09/02 0954) Temp Source: Oral (09/02 0954) BP: 123/55 mmHg (09/02 0954) Pulse Rate: 93 (09/02 0954)  Labs:  Recent Labs  09/22/14 1042  HGB 13.5  HCT 40.7  PLT 293  CREATININE 0.79  TROPONINI <0.03    Estimated Creatinine Clearance: 42.9 mL/min (by C-G formula based on Cr of 0.79).   Medical History: Past Medical History  Diagnosis Date  . Hypertension   . Hyperlipidemia   . Arthritis   . Hemihypertrophy   . Myocardial infarction   . Thyroid disease   . CVA (cerebral infarction) 2003  . Peripheral vascular disease     s/p stent right leg  . Pulmonary nodule     stable on Chest CT March 2014, Followed at [facility]  . Murmur, cardiac   . Chronic cystitis   . Hematuria   . Bleeding disorder   . Recurrent UTI   . Gross hematuria   . Urinary frequency   . Dysuria   . Sensory urge incontinence   . History of kidney stones     Medications:   (Not in a hospital admission)  Assessment: Pt admitted with PE.  No prior anticoagulation noted.  CrCl = 42.9 ml/min  Goal of Therapy:  resolution of PE   Plan:  Apixaban 10 mg PO BID X 7 days ordered to start 9/2 @ ~ 15:00. Apixaban 5 mg PO BID ordered to start 9/9 @ 10:00.  Will monitor H&H daily.   Sahvannah Rieser D 09/22/2014,2:47 PM

## 2014-09-23 DIAGNOSIS — R531 Weakness: Secondary | ICD-10-CM

## 2014-09-23 DIAGNOSIS — R0602 Shortness of breath: Secondary | ICD-10-CM

## 2014-09-23 DIAGNOSIS — Z8781 Personal history of (healed) traumatic fracture: Secondary | ICD-10-CM

## 2014-09-23 DIAGNOSIS — R350 Frequency of micturition: Secondary | ICD-10-CM

## 2014-09-23 DIAGNOSIS — R319 Hematuria, unspecified: Secondary | ICD-10-CM

## 2014-09-23 DIAGNOSIS — I10 Essential (primary) hypertension: Secondary | ICD-10-CM

## 2014-09-23 DIAGNOSIS — N3020 Other chronic cystitis without hematuria: Secondary | ICD-10-CM

## 2014-09-23 DIAGNOSIS — R011 Cardiac murmur, unspecified: Secondary | ICD-10-CM

## 2014-09-23 DIAGNOSIS — E079 Disorder of thyroid, unspecified: Secondary | ICD-10-CM

## 2014-09-23 DIAGNOSIS — I2699 Other pulmonary embolism without acute cor pulmonale: Principal | ICD-10-CM

## 2014-09-23 DIAGNOSIS — R918 Other nonspecific abnormal finding of lung field: Secondary | ICD-10-CM

## 2014-09-23 DIAGNOSIS — I252 Old myocardial infarction: Secondary | ICD-10-CM

## 2014-09-23 DIAGNOSIS — R4182 Altered mental status, unspecified: Secondary | ICD-10-CM

## 2014-09-23 DIAGNOSIS — Z8673 Personal history of transient ischemic attack (TIA), and cerebral infarction without residual deficits: Secondary | ICD-10-CM

## 2014-09-23 DIAGNOSIS — E785 Hyperlipidemia, unspecified: Secondary | ICD-10-CM

## 2014-09-23 DIAGNOSIS — Z87442 Personal history of urinary calculi: Secondary | ICD-10-CM

## 2014-09-23 DIAGNOSIS — R5383 Other fatigue: Secondary | ICD-10-CM

## 2014-09-23 DIAGNOSIS — Z90710 Acquired absence of both cervix and uterus: Secondary | ICD-10-CM

## 2014-09-23 DIAGNOSIS — Z87440 Personal history of urinary (tract) infections: Secondary | ICD-10-CM

## 2014-09-23 DIAGNOSIS — M129 Arthropathy, unspecified: Secondary | ICD-10-CM

## 2014-09-23 DIAGNOSIS — I739 Peripheral vascular disease, unspecified: Secondary | ICD-10-CM

## 2014-09-23 LAB — CBC
HCT: 36.7 % (ref 35.0–47.0)
Hemoglobin: 12.3 g/dL (ref 12.0–16.0)
MCH: 31.7 pg (ref 26.0–34.0)
MCHC: 33.6 g/dL (ref 32.0–36.0)
MCV: 94.5 fL (ref 80.0–100.0)
PLATELETS: 255 10*3/uL (ref 150–440)
RBC: 3.88 MIL/uL (ref 3.80–5.20)
RDW: 14 % (ref 11.5–14.5)
WBC: 11.4 10*3/uL — ABNORMAL HIGH (ref 3.6–11.0)

## 2014-09-23 LAB — URINE CULTURE
CULTURE: NO GROWTH
SPECIAL REQUESTS: NORMAL

## 2014-09-23 LAB — BASIC METABOLIC PANEL
Anion gap: 5 (ref 5–15)
BUN: 23 mg/dL — ABNORMAL HIGH (ref 6–20)
CALCIUM: 8.8 mg/dL — AB (ref 8.9–10.3)
CO2: 27 mmol/L (ref 22–32)
CREATININE: 0.74 mg/dL (ref 0.44–1.00)
Chloride: 104 mmol/L (ref 101–111)
Glucose, Bld: 88 mg/dL (ref 65–99)
Potassium: 3.8 mmol/L (ref 3.5–5.1)
SODIUM: 136 mmol/L (ref 135–145)

## 2014-09-23 NOTE — Consult Note (Signed)
[facility]  [phone] [phone]  ID: Krista Ellis OB: [birth year]  MR#: [id number]  MLJ#:[id number]  Patient Care Team: Jackolyn Confer, MD as PCP - General (Internal Medicine)  CHIEF COMPLAINT:  Chief Complaint  Patient presents with  . Altered Mental Status    INTERVAL HISTORY: Patient is an 81 year old female who was recently admitted to hospital with increasing shortness of breath and altered mental status. She is found to have pulmonary embolism as well as mildly enlarging pulmonary nodules. She had a pelvic fracture approximately 4 weeks ago which has led to a functional decline. Currently, she feels well and nearly back to her baseline. She has no neurologic complaints. She denies any chest pain or further shortness of breath. She denies any fevers. She denies any nausea, vomiting, constipation, or diarrhea. She has no urinary complaints. Patient otherwise feels well and offers no further specific complaints.  REVIEW OF SYSTEMS:   Review of Systems  Constitutional: Positive for malaise/fatigue. Negative for fever.  Respiratory: Positive for shortness of breath.   Cardiovascular: Negative for chest pain.  Gastrointestinal: Negative.   Neurological: Negative.     As per HPI. Otherwise, a complete review of systems is negatve.  PAST MEDICAL HISTORY: Past Medical History  Diagnosis Date  . Hypertension   . Hyperlipidemia   . Arthritis   . Hemihypertrophy   . Myocardial infarction   . Thyroid disease   . CVA (cerebral infarction) 2003  . Peripheral vascular disease     s/p stent right leg  . Pulmonary nodule     stable on Chest CT March 2014, Followed at [facility]  . Murmur, cardiac   . Chronic cystitis   . Hematuria   . Bleeding disorder   . Recurrent UTI   . Gross hematuria   . Urinary frequency   . Dysuria   . Sensory urge incontinence   . History of kidney stones     PAST SURGICAL HISTORY: Past Surgical History    Procedure Laterality Date  . Abdominal hysterectomy  1982  . Total hip arthroplasty      right  . Spine surgery      steel rod in back  . Tonsillectomy    . Left rotator cuff      repair  . Total knee arthroplasty Left   . Hip surgery  2007    replaced ball in right hip  . Cardiac surgery      2 failed stents in right leg  . Stent placement leg Right 2015  . Carpal tunnel release    . Appendectomy    . Ruptured disc      FAMILY HISTORY Family History  Problem Relation Age of Onset  . Hypertension Mother   . Transient ischemic attack Mother   . Kidney disease Neg Hx   . Bladder Cancer Neg Hx   . Prostate cancer Neg Hx        ADVANCED DIRECTIVES:    HEALTH MAINTENANCE: Social History  Substance Use Topics  . Smoking status: Never Smoker   . Smokeless tobacco: Never Used  . Alcohol Use: No     Colonoscopy:  PAP:  Bone density:  Lipid panel:  Allergies  Allergen Reactions  . Sulfa Antibiotics Hives and Itching  . Ciprofloxacin Hives and Itching  . Doxycycline Hives and Itching  . Ferrous Sulfate Hives and Itching  . Levofloxacin Hives and Itching  . Penicillins Hives and Itching    Current Facility-Administered Medications  Medication Dose Route Frequency Provider Last Rate Last Dose  . acetaminophen (TYLENOL) tablet 325 mg  325 mg Oral Q6H PRN Vaughan Basta, MD      . amLODipine (NORVASC) tablet 5 mg  5 mg Oral Daily Vaughan Basta, MD   5 mg at 09/23/14 0813  . apixaban (ELIQUIS) tablet 10 mg  10 mg Oral BID Vaughan Basta, MD   10 mg at 09/23/14 1884  . [START ON 09/29/2014] apixaban (ELIQUIS) tablet 5 mg  5 mg Oral BID Vaughan Basta, MD      . cyanocobalamin ((VITAMIN B-12)) injection 1,000 mcg  1,000 mcg Intramuscular Q30 days Vaughan Basta, MD   1,000 mcg at 09/22/14 1732  . docusate sodium (COLACE) capsule 100 mg  100 mg Oral BID PRN Vaughan Basta, MD      . feeding supplement (ENSURE ENLIVE) (ENSURE  ENLIVE) liquid 237 mL  237 mL Oral BID PC Vaughan Basta, MD   237 mL at 09/23/14 0815  . HYDROcodone-acetaminophen (NORCO/VICODIN) 5-325 MG per tablet 1 tablet  1 tablet Oral TID PRN Vaughan Basta, MD   1 tablet at 09/22/14 1756  . levothyroxine (SYNTHROID, LEVOTHROID) tablet 25 mcg  25 mcg Oral QAC breakfast Vaughan Basta, MD   25 mcg at 09/23/14 0813  . loratadine (CLARITIN) tablet 10 mg  10 mg Oral Daily Vaughan Basta, MD   10 mg at 09/23/14 0813  . metoprolol succinate (TOPROL-XL) 24 hr tablet 12.5 mg  12.5 mg Oral Martin Majestic, MD   12.5 mg at 09/23/14 0813  . pravastatin (PRAVACHOL) tablet 40 mg  40 mg Oral QPM Vaughan Basta, MD   40 mg at 09/22/14 1730  . pregabalin (LYRICA) capsule 50 mg  50 mg Oral BID Vaughan Basta, MD   50 mg at 09/23/14 0813  . senna (SENOKOT) tablet 8.6 mg  1 tablet Oral Daily PRN Vaughan Basta, MD   8.6 mg at 09/22/14 1756  . vancomycin (VANCOCIN) IVPB 750 mg/150 ml premix  750 mg Intravenous Q24H Vaughan Basta, MD        OBJECTIVE: Filed Vitals:   09/23/14 1325  BP: 143/114  Pulse: 104  Temp: 99.1 F (37.3 C)  Resp: 20     Body mass index is 20.85 kg/(m^2).    ECOG FS:2 - Symptomatic, <50% confined to bed  General: Well-developed, well-nourished, no acute distress. Eyes: Pink conjunctiva, anicteric sclera. HEENT: Normocephalic, moist mucous membranes, clear oropharnyx. Lungs: Clear to auscultation bilaterally. Heart: Regular rate and rhythm. No rubs, murmurs, or gallops. Abdomen: Soft, nontender, nondistended. No organomegaly noted, normoactive bowel sounds. Musculoskeletal: No edema, cyanosis, or clubbing. Neuro: Alert, answering all questions appropriately. Cranial nerves grossly intact. Skin: No rashes or petechiae noted. Psych: Normal affect. Lymphatics: No cervical, calvicular, axillary or inguinal LAD.   LAB RESULTS:  Lab Results  Component Value Date   NA 136  09/23/2014   K 3.8 09/23/2014   CL 104 09/23/2014   CO2 27 09/23/2014   GLUCOSE 88 09/23/2014   BUN 23* 09/23/2014   CREATININE 0.74 09/23/2014   CALCIUM 8.8* 09/23/2014   PROT 7.0 09/22/2014   ALBUMIN 3.7 09/22/2014   AST 46* 09/22/2014   ALT 21 09/22/2014   ALKPHOS 74 09/22/2014   BILITOT 1.0 09/22/2014   GFRNONAA >60 09/23/2014   GFRAA >60 09/23/2014    Lab Results  Component Value Date   WBC 11.4* 09/23/2014   NEUTROABS 18.9* 09/22/2014   HGB 12.3 09/23/2014   HCT 36.7 09/23/2014   MCV  94.5 09/23/2014   PLT 255 09/23/2014     STUDIES: Dg Chest 2 View  09/22/2014   CLINICAL DATA:  Decreased oxygen saturation.  Cough and dyspnea  EXAM: CHEST  2 VIEW  COMPARISON:  September 12, 2014  FINDINGS: There is mild atelectasis in the right base. Lungs elsewhere clear. Heart size and pulmonary vascularity are normal. No adenopathy. There is extensive arthropathy in the left shoulder region. There is degenerative change in the thoracic spine.  IMPRESSION: No edema or consolidation.  Mild atelectasis right base.   Electronically Signed   By: Lowella Grip III M.D.   On: 09/22/2014 10:45   Ct Angio Chest Pe W/cm &/or Wo Cm  09/22/2014   CLINICAL DATA:  Dyspnea.  EXAM: CT ANGIOGRAPHY CHEST WITH CONTRAST  TECHNIQUE: Multidetector CT imaging of the chest was performed using the standard protocol during bolus administration of intravenous contrast. Multiplanar CT image reconstructions and MIPs were obtained to evaluate the vascular anatomy.  CONTRAST:  29mL OMNIPAQUE IOHEXOL 350 MG/ML SOLN  COMPARISON:  Chest radiograph dated 09/22/2014, chest CT dated 09/25/2013.  FINDINGS: There is nearly occlusive pulmonary embolus within the takeoff of the pulmonary artery supplying the superior segment of right lower lobe. There is no definite evidence of right heart strain, however there is mild straightening of the interventricular septum.  The heart is mildly enlarged. Coronary artery disease and  calcifications of the aortic valve annulus are noted. There are mildly enlarged mediastinal and hilar lymph nodes most notably left aortopulmonary window node measuring 9 mm in short axis. There is no evidence of significant focal lung parenchymal consolidation, pleural effusion or pneumothorax. Mosaic attenuation of the lung parenchyma is noted.  There are several less than 7 mm bilateral pulmonary nodules. Mild enlargement of the central right upper lobe nodule is seen which now measures 7 mm. There is also a more prominent 4 mm nodule in the inferior portion of the right upper lobe. Stable 3-4 mm nodules are seen within the right middle lobe, right upper lobe, right lower lobe. Subpleural nodular thickening in the left lower lobe is also stable. Ground-glass paraseptal 6 mm nodule in the lingula, and more centrally located soft tissue 6 mm nodule in the lingula are also stable. There is new subpleural nodular thickening in the left lung apex, and superior-most portion of the superior segment of the left lower lobe.  No axillary lymphadenopathy is seen. The visualized portions of the thyroid gland demonstrate 1.2 cm nodule within the inferior portion of the right thyroid gland.  The visualized portions of the liver and spleen are unremarkable.  The visualized portions of the pancreas, stomach, adrenal glands are within normal limits. There are bilateral hypoattenuated renal nodules, too small to be accurately characterize but likely representing cysts. The gallbladder is distended measuring up to 4 cm and cross-section.  There is sclerotic appearance of T12 vertebral body, with mild height loss, new from 09/25/2013.  Review of the MIP images confirms the above findings.  IMPRESSION: Nearly occlusive right lower lobe upper segment pulmonary embolus. No definite evidence of right heart strain.  Numerous bilateral pulmonary nodules, with notable mild increase in the size of right upper lobe nodules. Metastatic  disease cannot be excluded.  Indeterminate mediastinal lymph nodes.  Mosaic attenuation of the lung parenchyma, which may be due to areal hypoperfusion or hypoventilation  1.2 cm right thyroid lobe nodule.  Probable bilateral renal cysts.  Prominent but subpathologic distention of the gallbladder.  Diffusely sclerotic appearance of  T12 vertebral body with mild height loss. This may be due to degenerative changes, however its asymmetry from the remainder of the visualized thoracic vertebral bodies make metastatic disease a consideration.  Key findings were called by telephone at the time of interpretation on 09/22/2014 at 12:44 pm to Dr. Delman Kitten , who verbally acknowledged these results.   Electronically Signed   By: Fidela Salisbury M.D.   On: 09/22/2014 13:21   Dg Chest Port 1 View  09/12/2014   CLINICAL DATA:  PICC line placement.  EXAM: PORTABLE CHEST - 1 VIEW  COMPARISON:  09/10/2014  FINDINGS: There has been interval placement of right subclavian approach PICC line with tip overlying the expected location of the cavoatrial junction. There is no evidence of pneumothorax.  Cardiac silhouette is enlarged. There is blunting of the left costophrenic angle, which likely represents a left pleural effusion and associated subsegmental atelectasis or airspace consolidation.  Severe osteoarthritic or posttraumatic changes of the left shoulder are noted.  IMPRESSION: Status post right PICC line placement. No evidence of a pneumothorax.  Enlarged cardiac silhouette.  Possible left pleural effusion and/or left lower lobe consolidation or subsegmental atelectasis.   Electronically Signed   By: Fidela Salisbury M.D.   On: 09/12/2014 13:08   Dg Chest Port 1 View  09/10/2014   CLINICAL DATA:  Pt from home via EMS c/o weakness since last Wednesday, recent dx of UTI currently on abx, also recent pelvic fracture without surgery. Reports temp 100.3 PTA> Pt has had profound weakness and SOB since yesterday.  EXAM:  PORTABLE CHEST - 1 VIEW  COMPARISON:  05/09/2010.  Chest CT, 09/25/2013.  FINDINGS: There is reticular and hazy opacity at the lung bases, left greater than right, increased the prior exam. This most likely atelectasis. Pneumonia, particular at the left base common is possible.  Mild prominence of the interstitial markings is noted bilaterally. There is no convincing pulmonary edema. No pneumothorax.  Cardiac silhouette is normal in size and configuration. No mediastinal or hilar masses or evidence of adenopathy.  Bony thorax is demineralized. There arthropathic changes of the left shoulder.  IMPRESSION: 1. Left greater than right lung base opacity, which may all be due to atelectasis. Pneumonia is possible. No evidence of pulmonary edema.   Electronically Signed   By: Lajean Manes M.D.   On: 09/10/2014 17:32   Ct Renal Stone Study  09/10/2014   CLINICAL DATA:  Left lower quadrant pain for 5 days. Recent diagnosis of urinary tract infection. Recent pelvic fracture.  EXAM: CT ABDOMEN AND PELVIS WITHOUT CONTRAST  TECHNIQUE: Multidetector CT imaging of the abdomen and pelvis was performed following the standard protocol without IV contrast.  COMPARISON:  CT abdomen and pelvis 11/18/2013.  FINDINGS: The patient has small bilateral pleural effusions. Basilar airspace disease is more notable on the left. Heart size is mildly enlarged. No pericardial effusion.  The gallbladder, liver, spleen, adrenal glands, pancreas and right kidney appear normal. A nonobstructing stone lower pole the left kidney measures 0.4 cm. A small left renal cyst is identified.  There is extensive aortoiliac atherosclerosis without aneurysm. The patient is status post hysterectomy. Sigmoid diverticulosis without diverticulitis is identified. The colon is otherwise unremarkable. The stomach and small bowel appear normal. No lymphadenopathy or fluid.  The patient has marked multilevel thoracic and lumbar spondylosis. Postoperative change of L4-5  fusion is again seen. An acute or subacute right inferior pubic ramus fracture is noted. There is new patchy sclerosis throughout the T11 vertebral  body.  IMPRESSION: Small bilateral pleural effusions and basilar airspace disease, worse on the left. Airspace disease in the left lung base could be atelectasis or pneumonia. Airspace opacity on the right has an appearance most consistent with atelectasis.  New sclerosis in the T11 vertebral body may be degenerative but cannot be definitively characterized and sclerotic metastasis is within the differential. Recommend whole-body bone scan for further evaluation.  Acute or subacute right inferior pubic ramus fracture.  Sigmoid diverticulosis without diverticulitis.  0.4 cm nonobstructing stone lower pole left kidney.  Atherosclerosis.   Electronically Signed   By: Inge Rise M.D.   On: 09/10/2014 19:49    ASSESSMENT: Pulmonary Krista Ellis, enlarging pulmonary nodules.  PLAN:    1. Pulmonary nodules: Patient was last evaluated in the [facility] in March 2015. Her known pulmonary nodules are only mildly larger and continue to be less then 1 cm. The possibility of malignancy is still remains, but lesions are still under the threshold of PET scan. Given patient's multiple comorbidities, would not recommend pursuing biopsy. If desired, patient can follow-up in the [facility] in 4-6 months with repeat imaging and further evaluation. 2. Pulmonary embolus: Likely secondary to decreased functional status and mobility from broken pelvis approximately 4 weeks ago. Continue anticoagulation as ordered. Patient will need to be monitored closely since she appears to be a fall risk and at risk for increased bleeding.  Appreciate consult, call with questions.   Lloyd Huger, MD   09/23/2014 2:30 PM

## 2014-09-23 NOTE — Progress Notes (Signed)
[facility] [place] NAME: Krista Ellis    MR#:  [id number]  DATE OF BIRTH:  1930-09-21  SUBJECTIVE:  CHIEF COMPLAINT:   Chief Complaint  Patient presents with  . Altered Mental Status    Shortness of breath is better today, no complains.  REVIEW OF SYSTEMS:  CONSTITUTIONAL: No fever, fatigue or weakness.  EYES: No blurred or double vision.  EARS, NOSE, AND THROAT: No tinnitus or ear pain.  RESPIRATORY: No cough, shortness of breath, wheezing or hemoptysis.  CARDIOVASCULAR: No chest pain, orthopnea, edema.  GASTROINTESTINAL: No nausea, vomiting, diarrhea or abdominal pain.  GENITOURINARY: No dysuria, hematuria.  ENDOCRINE: No polyuria, nocturia,  HEMATOLOGY: No anemia, easy bruising or bleeding SKIN: No rash or lesion. MUSCULOSKELETAL: No joint pain or arthritis.   NEUROLOGIC: No tingling, numbness, weakness.  PSYCHIATRY: No anxiety or depression.   ROS  DRUG ALLERGIES:   Allergies  Allergen Reactions  . Sulfa Antibiotics Hives and Itching  . Ciprofloxacin Hives and Itching  . Doxycycline Hives and Itching  . Ferrous Sulfate Hives and Itching  . Levofloxacin Hives and Itching  . Penicillins Hives and Itching    VITALS:  Blood pressure 126/33, pulse 80, temperature 98.2 F (36.8 C), temperature source Oral, resp. rate 18, height 5\' 2"  (1.575 m), weight 51.71 kg (114 lb), SpO2 91 %.  PHYSICAL EXAMINATION:   GENERAL: 81 y.o.-year-old patient lying in the bed with no acute distress.  EYES: Pupils equal, round, reactive to light and accommodation. No scleral icterus. Extraocular muscles intact.  HEENT: Head atraumatic, normocephalic. Oropharynx and nasopharynx clear.  NECK: Supple, no jugular venous distention. No thyroid enlargement, no tenderness.  LUNGS: Normal breath sounds bilaterally, no wheezing, rales,rhonchi or crepitation. No use of accessory muscles of respiration.  CARDIOVASCULAR: S1, S2 normal.  Systolic ejection murmurs present.  ABDOMEN: Soft, nontender, nondistended. Bowel sounds present. No organomegaly or mass.  EXTREMITIES: No pedal edema, cyanosis, or clubbing. Left side extrimity looks significantly more bigger than right- she said- she was born like that. NEUROLOGIC: Cranial nerves II through XII are intact. Muscle strength 4/5 in all extremities. Sensation intact. Gait not checked.  PSYCHIATRIC: The patient is alert and oriented x 3.  SKIN: No obvious rash, lesion, or ulcer.   Physical Exam LABORATORY PANEL:   CBC  Recent Labs Lab 09/23/14 0442  WBC 11.4*  HGB 12.3  HCT 36.7  PLT 255   ------------------------------------------------------------------------------------------------------------------  Chemistries   Recent Labs Lab 09/22/14 1042 09/23/14 0442  NA 138 136  K 4.0 3.8  CL 105 104  CO2 25 27  GLUCOSE 101* 88  BUN 22* 23*  CREATININE 0.79 0.74  CALCIUM 9.4 8.8*  AST 46*  --   ALT 21  --   ALKPHOS 74  --   BILITOT 1.0  --    ------------------------------------------------------------------------------------------------------------------  Cardiac Enzymes  Recent Labs Lab 09/22/14 1042  TROPONINI <0.03   ------------------------------------------------------------------------------------------------------------------  RADIOLOGY:  Dg Chest 2 View  09/22/2014   CLINICAL DATA:  Decreased oxygen saturation.  Cough and dyspnea  EXAM: CHEST  2 VIEW  COMPARISON:  September 12, 2014  FINDINGS: There is mild atelectasis in the right base. Lungs elsewhere clear. Heart size and pulmonary vascularity are normal. No adenopathy. There is extensive arthropathy in the left shoulder region. There is degenerative change in the thoracic spine.  IMPRESSION: No edema or consolidation.  Mild atelectasis right base.   Electronically Signed   By: Lowella Grip III  M.D.   On: 09/22/2014 10:45   Ct Angio Chest Pe W/cm &/or Wo Cm  09/22/2014   CLINICAL DATA:   Dyspnea.  EXAM: CT ANGIOGRAPHY CHEST WITH CONTRAST  TECHNIQUE: Multidetector CT imaging of the chest was performed using the standard protocol during bolus administration of intravenous contrast. Multiplanar CT image reconstructions and MIPs were obtained to evaluate the vascular anatomy.  CONTRAST:  45mL OMNIPAQUE IOHEXOL 350 MG/ML SOLN  COMPARISON:  Chest radiograph dated 09/22/2014, chest CT dated 09/25/2013.  FINDINGS: There is nearly occlusive pulmonary embolus within the takeoff of the pulmonary artery supplying the superior segment of right lower lobe. There is no definite evidence of right heart strain, however there is mild straightening of the interventricular septum.  The heart is mildly enlarged. Coronary artery disease and calcifications of the aortic valve annulus are noted. There are mildly enlarged mediastinal and hilar lymph nodes most notably left aortopulmonary window node measuring 9 mm in short axis. There is no evidence of significant focal lung parenchymal consolidation, pleural effusion or pneumothorax. Mosaic attenuation of the lung parenchyma is noted.  There are several less than 7 mm bilateral pulmonary nodules. Mild enlargement of the central right upper lobe nodule is seen which now measures 7 mm. There is also a more prominent 4 mm nodule in the inferior portion of the right upper lobe. Stable 3-4 mm nodules are seen within the right middle lobe, right upper lobe, right lower lobe. Subpleural nodular thickening in the left lower lobe is also stable. Ground-glass paraseptal 6 mm nodule in the lingula, and more centrally located soft tissue 6 mm nodule in the lingula are also stable. There is new subpleural nodular thickening in the left lung apex, and superior-most portion of the superior segment of the left lower lobe.  No axillary lymphadenopathy is seen. The visualized portions of the thyroid gland demonstrate 1.2 cm nodule within the inferior portion of the right thyroid gland.   The visualized portions of the liver and spleen are unremarkable.  The visualized portions of the pancreas, stomach, adrenal glands are within normal limits. There are bilateral hypoattenuated renal nodules, too small to be accurately characterize but likely representing cysts. The gallbladder is distended measuring up to 4 cm and cross-section.  There is sclerotic appearance of T12 vertebral body, with mild height loss, new from 09/25/2013.  Review of the MIP images confirms the above findings.  IMPRESSION: Nearly occlusive right lower lobe upper segment pulmonary embolus. No definite evidence of right heart strain.  Numerous bilateral pulmonary nodules, with notable mild increase in the size of right upper lobe nodules. Metastatic disease cannot be excluded.  Indeterminate mediastinal lymph nodes.  Mosaic attenuation of the lung parenchyma, which may be due to areal hypoperfusion or hypoventilation  1.2 cm right thyroid lobe nodule.  Probable bilateral renal cysts.  Prominent but subpathologic distention of the gallbladder.  Diffusely sclerotic appearance of T12 vertebral body with mild height loss. This may be due to degenerative changes, however its asymmetry from the remainder of the visualized thoracic vertebral bodies make metastatic disease a consideration.  Key findings were called by telephone at the time of interpretation on 09/22/2014 at 12:44 pm to Dr. Delman Kitten , who verbally acknowledged these results.   Electronically Signed   By: Fidela Salisbury M.D.   On: 09/22/2014 13:21    ASSESSMENT AND PLAN:   * Pulmonary embolism Most likely the cause for this is her decreased functional status because of recent Pelvic fracture. She has  multiple lung nodules which is chronic and she had follow-up in oncology clinic for that in the past. Some of the nodules looks slightly bigger as per the CT scan report from malignancy cannot be ruled out as underlying cause for her pulmonary embolism. She  denies any family history of blood clots, she denies any major bleeding GI or CNS bleeding in the past. ER give Lovenox 1 dose, started now on Eliquis.  * Lung nodules  oncology consult for further management of this issue.  * UTI She is recently treated for enterococcal UTI which were resistant to most of the routine antibiotics and she was finished with a course of vancomycin IV. As she again have numerous WBCs in her urine which was much better yesterday, and she has elevated white cell count with low-grade fever, resume vancomycin for now and will wait for urine culture.  * Hypothyroidism continue levothyroxine  * Hypertension continue metoprolol and amlodipine  * Hypercholesterolemia continue pravastatin  * Generalized weakness and status post pelvic fracture Physical therapy evaluation and possible rehabilitation placement again as she came from rehabilitation.   All the records are reviewed and case discussed with Care Management/Social Workerr. Management plans discussed with the patient, family and they are in agreement.  CODE STATUS: DNR  TOTAL TIME TAKING CARE OF THIS PATIENT: 35 minutes.     POSSIBLE D/C IN 1-2 DAYS, DEPENDING ON CLINICAL CONDITION. Waiting for final urine culture report to decide Abx therapy for d/c.   Vaughan Basta M.D on 09/23/2014   Between 7am to 6pm - Pager - [phone]  After 6pm go to [email or web address] - password [email or web address] [facility]  Office  [phone]  CC: Primary care physician; [name], MD

## 2014-09-23 NOTE — Plan of Care (Signed)
Problem: Discharge Progression Outcomes Goal: Other Discharge Outcomes/Goals Outcome: Progressing Plan of care progress to goal for: PE: Pt on eliquis. No c/o pain. Sat up in chair this afternoon. Tolerating diet well. Plan for discharge to SNF once stable.

## 2014-09-23 NOTE — Plan of Care (Signed)
Problem: Discharge Progression Outcomes Goal: Other Discharge Outcomes/Goals Outcome: Progressing Pt alert and oriented. No c/o pain or discomfort. Eliquis continues for PE. 1 person assist to [facility]. Large BM this shift.

## 2014-09-23 NOTE — Progress Notes (Signed)
PT Cancellation Note  Patient Details Name: Krista Ellis MRN: [id number] DOB: 01-02-1931   Cancelled Treatment:    Reason Eval/Treat Not Completed: Patient not medically ready  Pt found to have PE, will need 2 day anticogulation regime before appropriate for PT.   Kreg Shropshire 09/23/2014, 3:32 PM

## 2014-09-24 DIAGNOSIS — N3000 Acute cystitis without hematuria: Secondary | ICD-10-CM | POA: Diagnosis not present

## 2014-09-24 DIAGNOSIS — E785 Hyperlipidemia, unspecified: Secondary | ICD-10-CM | POA: Diagnosis not present

## 2014-09-24 DIAGNOSIS — M1990 Unspecified osteoarthritis, unspecified site: Secondary | ICD-10-CM | POA: Diagnosis not present

## 2014-09-24 DIAGNOSIS — I10 Essential (primary) hypertension: Secondary | ICD-10-CM | POA: Diagnosis not present

## 2014-09-24 DIAGNOSIS — E538 Deficiency of other specified B group vitamins: Secondary | ICD-10-CM | POA: Diagnosis not present

## 2014-09-24 DIAGNOSIS — S329XXD Fracture of unspecified parts of lumbosacral spine and pelvis, subsequent encounter for fracture with routine healing: Secondary | ICD-10-CM | POA: Diagnosis not present

## 2014-09-24 DIAGNOSIS — R079 Chest pain, unspecified: Secondary | ICD-10-CM | POA: Diagnosis not present

## 2014-09-24 DIAGNOSIS — N390 Urinary tract infection, site not specified: Secondary | ICD-10-CM | POA: Diagnosis not present

## 2014-09-24 DIAGNOSIS — E039 Hypothyroidism, unspecified: Secondary | ICD-10-CM | POA: Diagnosis not present

## 2014-09-24 DIAGNOSIS — R911 Solitary pulmonary nodule: Secondary | ICD-10-CM | POA: Diagnosis not present

## 2014-09-24 DIAGNOSIS — R102 Pelvic and perineal pain: Secondary | ICD-10-CM | POA: Diagnosis not present

## 2014-09-24 DIAGNOSIS — R531 Weakness: Secondary | ICD-10-CM | POA: Diagnosis not present

## 2014-09-24 DIAGNOSIS — M6281 Muscle weakness (generalized): Secondary | ICD-10-CM | POA: Diagnosis not present

## 2014-09-24 DIAGNOSIS — J309 Allergic rhinitis, unspecified: Secondary | ICD-10-CM | POA: Diagnosis not present

## 2014-09-24 DIAGNOSIS — I2699 Other pulmonary embolism without acute cor pulmonale: Secondary | ICD-10-CM | POA: Diagnosis not present

## 2014-09-24 DIAGNOSIS — Z792 Long term (current) use of antibiotics: Secondary | ICD-10-CM | POA: Diagnosis not present

## 2014-09-24 DIAGNOSIS — M810 Age-related osteoporosis without current pathological fracture: Secondary | ICD-10-CM | POA: Diagnosis not present

## 2014-09-24 DIAGNOSIS — Z7901 Long term (current) use of anticoagulants: Secondary | ICD-10-CM | POA: Diagnosis not present

## 2014-09-24 MED ORDER — OXYCODONE-ACETAMINOPHEN 5-325 MG PO TABS: 1.0000 | ORAL_TABLET | Freq: Four times a day (QID) | ORAL | Status: DC | PRN

## 2014-09-24 MED ORDER — APIXABAN 5 MG PO TABS: 5.0000 mg | ORAL_TABLET | Freq: Two times a day (BID) | ORAL | Status: DC

## 2014-09-24 MED ORDER — APIXABAN 5 MG PO TABS: 10.0000 mg | ORAL_TABLET | Freq: Two times a day (BID) | ORAL | Status: DC

## 2014-09-24 NOTE — Clinical Social Work Note (Signed)
Clinical Social Work Assessment  Patient Details  Name: Krista Ellis MRN: [id number] Date of Birth: 03/22/1930  Date of referral:  09/24/14               Reason for consult:  Facility Placement, Other (Comment Required) (From [facility] short term rehab. )                Permission sought to share information with:  Chartered certified accountant granted to share information::  Yes, Verbal Permission Granted  Name::      IT sales professional::   Omar   Relationship::     Contact Information:     Housing/Transportation Living arrangements for the past 2 months:  [place], [place] of Information:  Patient Patient Interpreter Needed:  None Criminal Activity/Legal Involvement Pertinent to Current Situation/Hospitalization:  No - Comment as needed Significant Relationships:  Adult Children Lives with:  Self, Facility Resident Do you feel safe going back to the place where you live?  Yes Need for family participation in patient care:  Yes (Comment)  Care giving concerns: Patient is a short term rehab resident at [facility].    Social Worker assessment / plan: Holiday representative (CSW) received verbal consult from MD that patient is from [facility] and ready to return back there today. CSW met with patient to address consult. Patient reported that she has not been at [facility] for a full week yet. Patient is agreeable to going back to [place] to finish rehab. Patient reported that before she was at [facility] she lives alone in [place]. Per patient she had an aide that stayed with her most of the time at home. Patient reported that she has 3 daughters that provide support.   FL2 complete and on chart.   Employment status:  Disabled (Comment on whether or not currently receiving Disability), Retired Forensic scientist:  Medicare PT Recommendations:  Not assessed at this time Information / Referral to  community resources:  [place]  Patient/Family's Response to care: Patient is agreeable to going back to [place] today.   Patient/Family's Understanding of and Emotional Response to Diagnosis, Current Treatment, and Prognosis: Patient was pleasant throughout assessment.   Emotional Assessment Appearance:  Appears stated age Attitude/Demeanor/Rapport:    Affect (typically observed):  Accepting, Adaptable, Pleasant Orientation:  Oriented to Self, Oriented to Place, Oriented to  Time, Oriented to Situation Alcohol / Substance use:  Not Applicable Psych involvement (Current and /or in the community):  No (Comment)  Discharge Needs  Concerns to be addressed:  Discharge Planning Concerns Readmission within the last 30 days:  No Current discharge risk:  None Barriers to Discharge:  No Barriers Identified   Loralyn Freshwater, LCSW 09/24/2014, 12:08 PM

## 2014-09-24 NOTE — Evaluation (Signed)
Physical Therapy Evaluation Patient Details Name: Krista Ellis MRN: [id number] DOB: 03-22-1930 Today's Date: 09/24/2014   History of Present Illness  Pt was at STR, found to have PE  Clinical Impression  Pt pleasant t/o session but is concerned about what she will do after STR.  She reports her daughters have been looking into a rest home, and though she does not want this she realizes it is probably for the best.  She shows good effort with session and though she is unable to get to sitting from supine (and back to supine from EOB) without assist she does do relatively well with ambulation w/o increased pain or significant safety concerns.     Follow Up Recommendations SNF    Equipment Recommendations  Rolling walker with 5" wheels    Recommendations for Other Services       Precautions / Restrictions Precautions Precautions: Fall Restrictions Weight Bearing Restrictions: No      Mobility  Bed Mobility Overal bed mobility: Needs Assistance Bed Mobility: Supine to Sit     Supine to sit: Mod assist     General bed mobility comments: Pt shows good effort trying to get up w/o assist but is not able to get to sitting and in sitting initally needs consderable assist to remain upright  Transfers Overall transfer level: Needs assistance Equipment used: Rolling walker (2 wheeled) Transfers: Sit to/from Stand Sit to Stand: Min assist         General transfer comment: Pt is able to rise with only CGA, but is unsteady and does not show great confidence.  Ambulation/Gait Ambulation/Gait assistance: Min guard;Min assist Ambulation Distance (Feet): 30 Feet Assistive device: Rolling walker (2 wheeled)       General Gait Details: Pt is able to ambulate with lean and limp, but does not have any LOBs.  Stairs            Wheelchair Mobility    Modified Rankin (Stroke Patients Only)       Balance                                              Pertinent Vitals/Pain      Home Living Family/patient expects to be discharged to:: Skilled nursing facility                      Prior Function Level of Independence: Independent with assistive device(s)   Gait / Transfers Assistance Needed: RW for gait  ADL's / Homemaking Assistance Needed: Assist to get out of bed, bath, dress.  Comments: Pt reports that she has been having a harder time at hoem and that her and her 3 daughters have been looking at other options     Hand Dominance        Extremity/Trunk Assessment   Upper Extremity Assessment: Generalized weakness           Lower Extremity Assessment: Generalized weakness         Communication   Communication: No difficulties  Cognition Arousal/Alertness: Awake/alert Behavior During Therapy: WFL for tasks assessed/performed Overall Cognitive Status: Within Functional Limits for tasks assessed                      General Comments      Exercises        Assessment/Plan  PT Assessment Patient needs continued PT services  PT Diagnosis Difficulty walking;Abnormality of gait   PT Problem List Decreased strength;Decreased activity tolerance;Decreased knowledge of use of DME;Decreased mobility  PT Treatment Interventions Gait training;Functional mobility training   PT Goals (Current goals can be found in the Care Plan section) Acute Rehab PT Goals Patient Stated Goal: to participate in therapy  PT Goal Formulation: With patient Time For Goal Achievement: 10/08/14 Potential to Achieve Goals: Fair    Frequency Min 2X/week   Barriers to discharge        Co-evaluation               End of Session Equipment Utilized During Treatment: Gait belt Activity Tolerance: Patient tolerated treatment well Patient left: with bed alarm set Nurse Communication: Mobility status         Time: 1150-1210 PT Time Calculation (min) (ACUTE ONLY): 20 min   Charges:   PT  Evaluation $Initial PT Evaluation Tier I: 1 Procedure     PT G Codes:       Wayne Both, PT, DPT [phone]  Kreg Shropshire 09/24/2014, 2:55 PM

## 2014-09-24 NOTE — Discharge Instructions (Signed)
Follow with PMD in 2-3 weeks.

## 2014-09-24 NOTE — Progress Notes (Signed)
[facility] NAME: Krista Ellis    MR#:  [id number]  DATE OF BIRTH:  1930-07-08  SUBJECTIVE:  CHIEF COMPLAINT:   Chief Complaint  Patient presents with  . Altered Mental Status    Shortness of breath is better today, no complains.  REVIEW OF SYSTEMS:  CONSTITUTIONAL: No fever, fatigue or weakness.  EYES: No blurred or double vision.  EARS, NOSE, AND THROAT: No tinnitus or ear pain.  RESPIRATORY: No cough, shortness of breath, wheezing or hemoptysis.  CARDIOVASCULAR: No chest pain, orthopnea, edema.  GASTROINTESTINAL: No nausea, vomiting, diarrhea or abdominal pain.  GENITOURINARY: No dysuria, hematuria.  ENDOCRINE: No polyuria, nocturia,  HEMATOLOGY: No anemia, easy bruising or bleeding SKIN: No rash or lesion. MUSCULOSKELETAL: No joint pain or arthritis.   NEUROLOGIC: No tingling, numbness, weakness.  PSYCHIATRY: No anxiety or depression.   ROS  DRUG ALLERGIES:   Allergies  Allergen Reactions  . Sulfa Antibiotics Hives and Itching  . Ciprofloxacin Hives and Itching  . Doxycycline Hives and Itching  . Ferrous Sulfate Hives and Itching  . Levofloxacin Hives and Itching  . Penicillins Hives and Itching    VITALS:  Blood pressure 134/57, pulse 87, temperature 98.3 F (36.8 C), temperature source Oral, resp. rate 20, height 5\' 2"  (1.575 m), weight 51.71 kg (114 lb), SpO2 97 %.  PHYSICAL EXAMINATION:   GENERAL: 81 y.o.-year-old patient lying in the bed with no acute distress.  EYES: Pupils equal, round, reactive to light and accommodation. No scleral icterus. Extraocular muscles intact.  HEENT: Head atraumatic, normocephalic. Oropharynx and nasopharynx clear.  NECK: Supple, no jugular venous distention. No thyroid enlargement, no tenderness.  LUNGS: Normal breath sounds bilaterally, no wheezing, rales,rhonchi or crepitation. No use of accessory muscles of respiration.  CARDIOVASCULAR: S1, S2 normal.  Systolic ejection murmurs present.  ABDOMEN: Soft, nontender, nondistended. Bowel sounds present. No organomegaly or mass.  EXTREMITIES: No pedal edema, cyanosis, or clubbing. Left side extrimity looks significantly more bigger than right- she said- she was born like that. NEUROLOGIC: Cranial nerves II through XII are intact. Muscle strength 4/5 in all extremities. Sensation intact. Gait not checked.  PSYCHIATRIC: The patient is alert and oriented x 3.  SKIN: No obvious rash, lesion, or ulcer.   Physical Exam LABORATORY PANEL:   CBC  Recent Labs Lab 09/23/14 0442  WBC 11.4*  HGB 12.3  HCT 36.7  PLT 255   ------------------------------------------------------------------------------------------------------------------  Chemistries   Recent Labs Lab 09/22/14 1042 09/23/14 0442  NA 138 136  K 4.0 3.8  CL 105 104  CO2 25 27  GLUCOSE 101* 88  BUN 22* 23*  CREATININE 0.79 0.74  CALCIUM 9.4 8.8*  AST 46*  --   ALT 21  --   ALKPHOS 74  --   BILITOT 1.0  --    ------------------------------------------------------------------------------------------------------------------  Cardiac Enzymes  Recent Labs Lab 09/22/14 1042  TROPONINI <0.03   ------------------------------------------------------------------------------------------------------------------  RADIOLOGY:  Dg Chest 2 View  09/22/2014   CLINICAL DATA:  Decreased oxygen saturation.  Cough and dyspnea  EXAM: CHEST  2 VIEW  COMPARISON:  September 12, 2014  FINDINGS: There is mild atelectasis in the right base. Lungs elsewhere clear. Heart size and pulmonary vascularity are normal. No adenopathy. There is extensive arthropathy in the left shoulder region. There is degenerative change in the thoracic spine.  IMPRESSION: No edema or consolidation.  Mild atelectasis right base.   Electronically Signed   By: Lowella Grip III  M.D.   On: 09/22/2014 10:45   Ct Angio Chest Pe W/cm &/or Wo Cm  09/22/2014   CLINICAL DATA:   Dyspnea.  EXAM: CT ANGIOGRAPHY CHEST WITH CONTRAST  TECHNIQUE: Multidetector CT imaging of the chest was performed using the standard protocol during bolus administration of intravenous contrast. Multiplanar CT image reconstructions and MIPs were obtained to evaluate the vascular anatomy.  CONTRAST:  63mL OMNIPAQUE IOHEXOL 350 MG/ML SOLN  COMPARISON:  Chest radiograph dated 09/22/2014, chest CT dated 09/25/2013.  FINDINGS: There is nearly occlusive pulmonary embolus within the takeoff of the pulmonary artery supplying the superior segment of right lower lobe. There is no definite evidence of right heart strain, however there is mild straightening of the interventricular septum.  The heart is mildly enlarged. Coronary artery disease and calcifications of the aortic valve annulus are noted. There are mildly enlarged mediastinal and hilar lymph nodes most notably left aortopulmonary window node measuring 9 mm in short axis. There is no evidence of significant focal lung parenchymal consolidation, pleural effusion or pneumothorax. Mosaic attenuation of the lung parenchyma is noted.  There are several less than 7 mm bilateral pulmonary nodules. Mild enlargement of the central right upper lobe nodule is seen which now measures 7 mm. There is also a more prominent 4 mm nodule in the inferior portion of the right upper lobe. Stable 3-4 mm nodules are seen within the right middle lobe, right upper lobe, right lower lobe. Subpleural nodular thickening in the left lower lobe is also stable. Ground-glass paraseptal 6 mm nodule in the lingula, and more centrally located soft tissue 6 mm nodule in the lingula are also stable. There is new subpleural nodular thickening in the left lung apex, and superior-most portion of the superior segment of the left lower lobe.  No axillary lymphadenopathy is seen. The visualized portions of the thyroid gland demonstrate 1.2 cm nodule within the inferior portion of the right thyroid gland.   The visualized portions of the liver and spleen are unremarkable.  The visualized portions of the pancreas, stomach, adrenal glands are within normal limits. There are bilateral hypoattenuated renal nodules, too small to be accurately characterize but likely representing cysts. The gallbladder is distended measuring up to 4 cm and cross-section.  There is sclerotic appearance of T12 vertebral body, with mild height loss, new from 09/25/2013.  Review of the MIP images confirms the above findings.  IMPRESSION: Nearly occlusive right lower lobe upper segment pulmonary embolus. No definite evidence of right heart strain.  Numerous bilateral pulmonary nodules, with notable mild increase in the size of right upper lobe nodules. Metastatic disease cannot be excluded.  Indeterminate mediastinal lymph nodes.  Mosaic attenuation of the lung parenchyma, which may be due to areal hypoperfusion or hypoventilation  1.2 cm right thyroid lobe nodule.  Probable bilateral renal cysts.  Prominent but subpathologic distention of the gallbladder.  Diffusely sclerotic appearance of T12 vertebral body with mild height loss. This may be due to degenerative changes, however its asymmetry from the remainder of the visualized thoracic vertebral bodies make metastatic disease a consideration.  Key findings were called by telephone at the time of interpretation on 09/22/2014 at 12:44 pm to Dr. Delman Kitten , who verbally acknowledged these results.   Electronically Signed   By: Fidela Salisbury M.D.   On: 09/22/2014 13:21    ASSESSMENT AND PLAN:   * Pulmonary embolism Most likely the cause for this is her decreased functional status because of recent Pelvic fracture. She denies  any family history of blood clots, she denies any major bleeding GI or CNS bleeding in the past. ER give Lovenox 1 dose, started now on Eliquis.  * Lung nodules  oncology consult for further management of this issue.  As the nodule is still < 1 cm and  minimal enlargement compared to last year- Dr. Grayland Ormond suggested to continue survilence imaging every 55mth to a year.  * UTI She is recently treated for enterococcal UTI which were resistant to most of the routine antibiotics and she was finished with a course of vancomycin IV. As she again have numerous WBCs in her urine which was much better yesterday, and she has elevated white cell count with low-grade fever, resumed vancomycin. Urine culture reported to be contamination with Stool. She did not have any more fever, and blood cx is negative. Her WBC count is improved.  So she does not need IV Abx- I will resume Nitrofurantoin oral.   * Hypothyroidism continue levothyroxine  * Hypertension continue metoprolol and amlodipine  * Hypercholesterolemia continue pravastatin  * Generalized weakness and status post pelvic fracture Physical therapy evaluation and possible rehabilitation placement again as she came from rehabilitation.   All the records are reviewed and case discussed with Care Management/Social Workerr. Management plans discussed with the patient, family and they are in agreement.  CODE STATUS: DNR  TOTAL TIME TAKING CARE OF THIS PATIENT: 35 minutes.    POSSIBLE D/C today, if NH is ready to accept her. I spoke to Her daughter and Social worker about her possible discharge today.   Vaughan Basta M.D on 09/24/2014   Between 7am to 6pm - Pager - [phone]  After 6pm go to [email or web address] - password [email or web address]  Office  [phone]  CC: Primary care physician; [name], MD

## 2014-09-24 NOTE — Progress Notes (Signed)
Patient is medically stable for D/C to [facility] today. Per Kim admissions coordinator at [facility] patient is going to room 215. RN will call report to Anderson Malta at [phone] and arrange EMS for transport. Clinical Education officer, museum (CSW) prepared D/C packet and faxed D/C Summary to nurses station. Patient is aware of above. CSW contacted patient's daughter [name] and made her aware of above. RN has agreed to call [name] when EMS arrives to pick patient up. Please reconsult if future social work needs arise. CSW signing off.   Blima Rich, [place] [phone]

## 2014-09-24 NOTE — Progress Notes (Addendum)
Pt discharged to SNF. Report given to Anderson Malta, Therapist, sports at [facility]. EMS to transport. Pt left via EMS. Family friend to take belongings over to [facility] with patient.

## 2014-09-24 NOTE — Plan of Care (Signed)
Problem: Discharge Progression Outcomes Goal: Other Discharge Outcomes/Goals Outcome: Progressing Pt alert with periods of confusion throughout the night. Eliquis continues for PE. VSS. No complaints of pain or discomfort. Up with 1 assist to [facility]. Incontinent at times.

## 2014-09-24 NOTE — Discharge Summary (Signed)
[facility] [place] NAME: Krista Ellis    MR#:  [id number]  DATE OF BIRTH:  [birth year]  DATE OF ADMISSION:  09/22/2014 ADMITTING PHYSICIAN: Vaughan Basta, MD  DATE OF DISCHARGE: 09/24/2014  PRIMARY CARE PHYSICIAN: [name], MD    ADMISSION DIAGNOSIS:  Dyspnea [R06.00] Hypoxia [R09.02] Pulmonary nodule [R91.1] Pulmonary emboli [I26.99]  DISCHARGE DIAGNOSIS:  Principal Problem:   Pulmonary embolism   SECONDARY DIAGNOSIS:   Past Medical History  Diagnosis Date  . Hypertension   . Hyperlipidemia   . Arthritis   . Hemihypertrophy   . Myocardial infarction   . Thyroid disease   . CVA (cerebral infarction) 2003  . Peripheral vascular disease     s/p stent right leg  . Pulmonary nodule     stable on Chest CT March 2014, Followed at [facility]  . Murmur, cardiac   . Chronic cystitis   . Hematuria   . Bleeding disorder   . Recurrent UTI   . Gross hematuria   . Urinary frequency   . Dysuria   . Sensory urge incontinence   . History of kidney stones     HOSPITAL COURSE:   * Pulmonary embolism Most likely the cause for this is her decreased functional status because of recent Pelvic fracture. She denies any family history of blood clots, she denies any major bleeding GI or CNS bleeding in the past. ER give Lovenox 1 dose, started now on Eliquis.  * Lung nodules oncology consult for further management of this issue. As the nodule is still < 1 cm and minimal enlargement compared to last year- Dr. Grayland Ormond suggested to continue survilence imaging every 60mth to a year.  * UTI She is recently treated for enterococcal UTI which were resistant to most of the routine antibiotics and she was finished with a course of vancomycin IV. As she again have numerous WBCs in her urine which was much better yesterday, and she has elevated white cell count with low-grade fever, resumed vancomycin. Urine  culture reported to be contamination with Stool. She did not have any more fever, and blood cx is negative. Her WBC count is improved. So she does not need IV Abx- I will resume Nitrofurantoin oral.  * Hypothyroidism continue levothyroxine  * Hypertension continue metoprolol and amlodipine  * Hypercholesterolemia continue pravastatin  DISCHARGE CONDITIONS:   stable  CONSULTS OBTAINED:  Treatment Team:  Lloyd Huger, MD  DRUG ALLERGIES:   Allergies  Allergen Reactions  . Sulfa Antibiotics Hives and Itching  . Ciprofloxacin Hives and Itching  . Doxycycline Hives and Itching  . Ferrous Sulfate Hives and Itching  . Levofloxacin Hives and Itching  . Penicillins Hives and Itching    DISCHARGE MEDICATIONS:   Current Discharge Medication List    START taking these medications   Details  !! apixaban (ELIQUIS) 5 MG TABS tablet Take 2 tablets (10 mg total) by mouth 2 (two) times daily. Qty: 10 tablet, Refills: 0    !! apixaban (ELIQUIS) 5 MG TABS tablet Take 1 tablet (5 mg total) by mouth 2 (two) times daily. Qty: 60 tablet, Refills: 0     !! - Potential duplicate medications found. Please discuss with provider.    CONTINUE these medications which have CHANGED   Details  oxyCODONE-acetaminophen (ROXICET) 5-325 MG per tablet Take 1 tablet by mouth every 6 (six) hours as needed. Qty: 20 tablet, Refills: 0      CONTINUE these medications which  have NOT CHANGED   Details  acetaminophen (TYLENOL) 325 MG tablet Take 325 mg by mouth every 6 (six) hours as needed for mild pain or headache.    amLODipine (NORVASC) 5 MG tablet Take 5 mg by mouth daily.    cyanocobalamin (,VITAMIN B-12,) 1000 MCG/ML injection Inject 1,000 mcg into the muscle every 30 (thirty) days.     docusate sodium (COLACE) 100 MG capsule Take 1 capsule (100 mg total) by mouth 2 (two) times daily as needed for mild constipation. Qty: 10 capsule, Refills: 0    feeding supplement, ENSURE ENLIVE,  (ENSURE ENLIVE) LIQD Take 237 mLs by mouth 2 (two) times daily after a meal. Qty: 237 mL, Refills: 12    levothyroxine (SYNTHROID, LEVOTHROID) 25 MCG tablet TAKE 1 TABLET (25 MCG TOTAL) BY MOUTH DAILY. Qty: 90 tablet, Refills: 1    loratadine (CLARITIN) 10 MG tablet Take 1 tablet (10 mg total) by mouth daily.    metoprolol succinate (TOPROL-XL) 25 MG 24 hr tablet Take 12.5 mg by mouth every morning.     nitrofurantoin, macrocrystal-monohydrate, (MACROBID) 100 MG capsule Take 100 mg by mouth 2 (two) times daily.    pravastatin (PRAVACHOL) 40 MG tablet Take 1 tablet (40 mg total) by mouth every evening. Qty: 90 tablet, Refills: 1    pregabalin (LYRICA) 50 MG capsule Take 1 capsule (50 mg total) by mouth 2 (two) times daily. Qty: 60 capsule, Refills: 6    senna (SENOKOT) 8.6 MG TABS tablet Take 1 tablet (8.6 mg total) by mouth daily as needed for mild constipation. Qty: 120 each, Refills: 0      STOP taking these medications     sodium chloride 0.9 % injection      vancomycin 500 mg in sodium chloride 0.9 % 100 mL      HYDROcodone-acetaminophen (NORCO/VICODIN) 5-325 MG per tablet          DISCHARGE INSTRUCTIONS:    Follow with PMD in 1-2 week. Need to have CT chest for follow up in 1 year for lung nodule.  If you experience worsening of your admission symptoms, develop shortness of breath, life threatening emergency, suicidal or homicidal thoughts you must seek medical attention immediately by calling 911 or calling your MD immediately  if symptoms less severe.  You Must read complete instructions/literature along with all the possible adverse reactions/side effects for all the Medicines you take and that have been prescribed to you. Take any new Medicines after you have completely understood and accept all the possible adverse reactions/side effects.   Please note  You were cared for by a hospitalist during your hospital stay. If you have any questions about your discharge  medications or the care you received while you were in the hospital after you are discharged, you can call the unit and asked to speak with the hospitalist on call if the hospitalist that took care of you is not available. Once you are discharged, your primary care physician will handle any further medical issues. Please note that NO REFILLS for any discharge medications will be authorized once you are discharged, as it is imperative that you return to your primary care physician (or establish a relationship with a primary care physician if you do not have one) for your aftercare needs so that they can reassess your need for medications and monitor your lab values.    Today   CHIEF COMPLAINT:   Chief Complaint  Patient presents with  . Altered Mental Status  HISTORY OF PRESENT ILLNESS:  Nili Honda  is a 81 y.o. female with a known history of hypertension, hyperlipidemia, myocardial infarction, hypothyroidism, cerebrovascular accident, recurrent UTIs, recent fall a few weeks ago causing pubic fracture and has functional decline since then he has been in and out of hospital because of UTI after that and was recently sent to rehabilitation Center after admission last week in the hospital for UTI. She was discharged to [facility] last week with PICC line and IV vancomycin therapy to treat her for UTI with enterococci, she finished her vancomycin course today and PICC line was taken out the nursing home. She was more short of breath and so she was sent to hospital by [facility] as she was found hypoxic. In hospital CT scan of the chest with angiogram showed there is a pulmonary embolism and also lung nodules which patient was already known about are slightly bigger in appearance than before so she is given his admission to hospitalist team. On further questioning she denies any cough or chest pain and she denies any sputum production.   VITAL SIGNS:  Blood pressure 134/57, pulse 87,  temperature 98.3 F (36.8 C), temperature source Oral, resp. rate 20, height 5\' 2"  (1.575 m), weight 51.71 kg (114 lb), SpO2 97 %.  I/O:   Intake/Output Summary (Last 24 hours) at 09/24/14 1133 Last data filed at 09/23/14 1800  Gross per 24 hour  Intake    120 ml  Output      0 ml  Net    120 ml    PHYSICAL EXAMINATION:   GENERAL: 81 y.o.-year-old patient lying in the bed with no acute distress.  EYES: Pupils equal, round, reactive to light and accommodation. No scleral icterus. Extraocular muscles intact.  HEENT: Head atraumatic, normocephalic. Oropharynx and nasopharynx clear.  NECK: Supple, no jugular venous distention. No thyroid enlargement, no tenderness.  LUNGS: Normal breath sounds bilaterally, no wheezing, rales,rhonchi or crepitation. No use of accessory muscles of respiration.  CARDIOVASCULAR: S1, S2 normal. Systolic ejection murmurs present.  ABDOMEN: Soft, nontender, nondistended. Bowel sounds present. No organomegaly or mass.  EXTREMITIES: No pedal edema, cyanosis, or clubbing. Left side extrimity looks significantly more bigger than right- she said- she was born like that. NEUROLOGIC: Cranial nerves II through XII are intact. Muscle strength 4/5 in all extremities. Sensation intact. Gait not checked.  PSYCHIATRIC: The patient is alert and oriented x 3.  SKIN: No obvious rash, lesion, or ulcer.   DATA REVIEW:   CBC  Recent Labs Lab 09/23/14 0442  WBC 11.4*  HGB 12.3  HCT 36.7  PLT 255    Chemistries   Recent Labs Lab 09/22/14 1042 09/23/14 0442  NA 138 136  K 4.0 3.8  CL 105 104  CO2 25 27  GLUCOSE 101* 88  BUN 22* 23*  CREATININE 0.79 0.74  CALCIUM 9.4 8.8*  AST 46*  --   ALT 21  --   ALKPHOS 74  --   BILITOT 1.0  --     Cardiac Enzymes  Recent Labs Lab 09/22/14 1042  TROPONINI <0.03    Microbiology Results  Results for orders placed or performed during the hospital encounter of 09/22/14  Culture, blood (routine x 2)      Status: None (Preliminary result)   Collection Time: 09/22/14 10:42 AM  Result Value Ref Range Status   Specimen Description BLOOD RIGHT ARM  Final   Special Requests   Final    BOTTLES DRAWN AEROBIC AND  ANAEROBIC  AER 10CC ANA [place]   Culture NO GROWTH 2 DAYS  Final   Report Status PENDING  Incomplete  Culture, blood (routine x 2)     Status: None (Preliminary result)   Collection Time: 09/22/14 10:43 AM  Result Value Ref Range Status   Specimen Description BLOOD LEFT ARM  Final   Special Requests   Final    BOTTLES DRAWN AEROBIC AND ANAEROBIC  AER 5CC ANA 3CC   Culture NO GROWTH 2 DAYS  Final   Report Status PENDING  Incomplete  Urine culture     Status: None   Collection Time: 09/22/14  2:10 PM  Result Value Ref Range Status   Specimen Description URINE, RANDOM  Final   Special Requests Normal  Final   Culture   Final    MULTIPLE SPECIES PRESENT, SUGGEST RECOLLECTION Probably contamination with fecal flora.    Report Status 09/23/2014 FINAL  Final    RADIOLOGY:  Ct Angio Chest Pe W/cm &/or Wo Cm  09/22/2014   CLINICAL DATA:  Dyspnea.  EXAM: CT ANGIOGRAPHY CHEST WITH CONTRAST  TECHNIQUE: Multidetector CT imaging of the chest was performed using the standard protocol during bolus administration of intravenous contrast. Multiplanar CT image reconstructions and MIPs were obtained to evaluate the vascular anatomy.  CONTRAST:  71mL OMNIPAQUE IOHEXOL 350 MG/ML SOLN  COMPARISON:  Chest radiograph dated 09/22/2014, chest CT dated 09/25/2013.  FINDINGS: There is nearly occlusive pulmonary embolus within the takeoff of the pulmonary artery supplying the superior segment of right lower lobe. There is no definite evidence of right heart strain, however there is mild straightening of the interventricular septum.  The heart is mildly enlarged. Coronary artery disease and calcifications of the aortic valve annulus are noted. There are mildly enlarged mediastinal and hilar lymph nodes most notably left  aortopulmonary window node measuring 9 mm in short axis. There is no evidence of significant focal lung parenchymal consolidation, pleural effusion or pneumothorax. Mosaic attenuation of the lung parenchyma is noted.  There are several less than 7 mm bilateral pulmonary nodules. Mild enlargement of the central right upper lobe nodule is seen which now measures 7 mm. There is also a more prominent 4 mm nodule in the inferior portion of the right upper lobe. Stable 3-4 mm nodules are seen within the right middle lobe, right upper lobe, right lower lobe. Subpleural nodular thickening in the left lower lobe is also stable. Ground-glass paraseptal 6 mm nodule in the lingula, and more centrally located soft tissue 6 mm nodule in the lingula are also stable. There is new subpleural nodular thickening in the left lung apex, and superior-most portion of the superior segment of the left lower lobe.  No axillary lymphadenopathy is seen. The visualized portions of the thyroid gland demonstrate 1.2 cm nodule within the inferior portion of the right thyroid gland.  The visualized portions of the liver and spleen are unremarkable.  The visualized portions of the pancreas, stomach, adrenal glands are within normal limits. There are bilateral hypoattenuated renal nodules, too small to be accurately characterize but likely representing cysts. The gallbladder is distended measuring up to 4 cm and cross-section.  There is sclerotic appearance of T12 vertebral body, with mild height loss, new from 09/25/2013.  Review of the MIP images confirms the above findings.  IMPRESSION: Nearly occlusive right lower lobe upper segment pulmonary embolus. No definite evidence of right heart strain.  Numerous bilateral pulmonary nodules, with notable mild increase in the size of right upper  lobe nodules. Metastatic disease cannot be excluded.  Indeterminate mediastinal lymph nodes.  Mosaic attenuation of the lung parenchyma, which may be due to areal  hypoperfusion or hypoventilation  1.2 cm right thyroid lobe nodule.  Probable bilateral renal cysts.  Prominent but subpathologic distention of the gallbladder.  Diffusely sclerotic appearance of T12 vertebral body with mild height loss. This may be due to degenerative changes, however its asymmetry from the remainder of the visualized thoracic vertebral bodies make metastatic disease a consideration.  Key findings were called by telephone at the time of interpretation on 09/22/2014 at 12:44 pm to Dr. Delman Kitten , who verbally acknowledged these results.   Electronically Signed   By: Fidela Salisbury M.D.   On: 09/22/2014 13:21    EKG:   Orders placed or performed during the hospital encounter of 09/22/14  . ED EKG  . ED EKG  . EKG 12-Lead  . EKG 12-Lead  . EKG 12-Lead  . EKG 12-Lead      Management plans discussed with the patient, family and they are in agreement.  CODE STATUS:     Code Status Orders        Start     Ordered   09/22/14 1622  Do not attempt resuscitation (DNR)   Continuous    Question Answer Comment  In the event of cardiac or respiratory ARREST Do not call a "code blue"   In the event of cardiac or respiratory ARREST Do not perform Intubation, CPR, defibrillation or ACLS   In the event of cardiac or respiratory ARREST Use medication by any route, position, wound care, and other measures to relive pain and suffering. May use oxygen, suction and manual treatment of airway obstruction as needed for comfort.   Comments poprtable DNR paper in place,      09/22/14 1621    Advance Directive Documentation        Most Recent Value   Type of Advance Directive  Healthcare Power of Attorney, Living will   Pre-existing out of facility DNR order (yellow form or pink MOST form)     "MOST" Form in Place?        TOTAL TIME TAKING CARE OF THIS PATIENT: 40  minutes.    Vaughan Basta M.D on 09/24/2014 at 11:33 AM  Between 7am to 6pm - Pager -  [phone]  After 6pm go to [email or web address] - password [email or web address] [place]  Office  [phone]  CC: Primary care physician; [name], MD

## 2014-09-26 ENCOUNTER — Telehealth: Payer: Self-pay

## 2014-09-26 DIAGNOSIS — I2699 Other pulmonary embolism without acute cor pulmonale: Secondary | ICD-10-CM | POA: Diagnosis not present

## 2014-09-26 DIAGNOSIS — R531 Weakness: Secondary | ICD-10-CM | POA: Diagnosis not present

## 2014-09-26 DIAGNOSIS — M810 Age-related osteoporosis without current pathological fracture: Secondary | ICD-10-CM | POA: Diagnosis not present

## 2014-09-26 NOTE — Telephone Encounter (Signed)
Unable to reach patient. Second TCM attempt.  Will continue follow up and try and schedule an appointment with PCP.

## 2014-09-26 NOTE — Telephone Encounter (Signed)
Unable to reach patient.  First TCM attempt.  Availability on 09/29/14 for [place] appointment.  Will continue to follow up.

## 2014-09-27 LAB — CULTURE, BLOOD (ROUTINE X 2)
CULTURE: NO GROWTH
CULTURE: NO GROWTH

## 2014-10-05 DIAGNOSIS — S329XXD Fracture of unspecified parts of lumbosacral spine and pelvis, subsequent encounter for fracture with routine healing: Secondary | ICD-10-CM | POA: Diagnosis not present

## 2014-10-05 DIAGNOSIS — R102 Pelvic and perineal pain: Secondary | ICD-10-CM | POA: Diagnosis not present

## 2014-10-09 ENCOUNTER — Ambulatory Visit: Payer: Self-pay | Admitting: Urology

## 2014-10-14 LAB — URINALYSIS COMPLETE WITH MICROSCOPIC (ARMC ONLY)
BILIRUBIN URINE: NEGATIVE
Bacteria, UA: NONE SEEN
Glucose, UA: NEGATIVE mg/dL
KETONES UR: NEGATIVE mg/dL
NITRITE: NEGATIVE
Protein, ur: 30 mg/dL — AB
Specific Gravity, Urine: 1.017 (ref 1.005–1.030)
pH: 5 (ref 5.0–8.0)

## 2014-10-18 DIAGNOSIS — E785 Hyperlipidemia, unspecified: Secondary | ICD-10-CM | POA: Diagnosis not present

## 2014-10-18 DIAGNOSIS — N3090 Cystitis, unspecified without hematuria: Secondary | ICD-10-CM | POA: Diagnosis not present

## 2014-10-18 DIAGNOSIS — R0781 Pleurodynia: Secondary | ICD-10-CM | POA: Diagnosis not present

## 2014-10-18 DIAGNOSIS — I10 Essential (primary) hypertension: Secondary | ICD-10-CM | POA: Diagnosis not present

## 2014-10-18 DIAGNOSIS — G580 Intercostal neuropathy: Secondary | ICD-10-CM | POA: Diagnosis not present

## 2014-10-18 DIAGNOSIS — E441 Mild protein-calorie malnutrition: Secondary | ICD-10-CM | POA: Diagnosis not present

## 2014-10-18 DIAGNOSIS — R079 Chest pain, unspecified: Secondary | ICD-10-CM | POA: Diagnosis not present

## 2014-10-18 DIAGNOSIS — Y792 Prosthetic and other implants, materials and accessory orthopedic devices associated with adverse incidents: Secondary | ICD-10-CM | POA: Diagnosis not present

## 2014-10-18 DIAGNOSIS — Z23 Encounter for immunization: Secondary | ICD-10-CM | POA: Diagnosis not present

## 2014-10-18 DIAGNOSIS — M1990 Unspecified osteoarthritis, unspecified site: Secondary | ICD-10-CM | POA: Diagnosis not present

## 2014-10-18 DIAGNOSIS — Z86711 Personal history of pulmonary embolism: Secondary | ICD-10-CM | POA: Diagnosis not present

## 2014-10-18 DIAGNOSIS — N390 Urinary tract infection, site not specified: Secondary | ICD-10-CM | POA: Diagnosis not present

## 2014-10-18 DIAGNOSIS — I2609 Other pulmonary embolism with acute cor pulmonale: Secondary | ICD-10-CM | POA: Diagnosis not present

## 2014-10-18 DIAGNOSIS — I739 Peripheral vascular disease, unspecified: Secondary | ICD-10-CM | POA: Diagnosis not present

## 2014-10-18 DIAGNOSIS — E039 Hypothyroidism, unspecified: Secondary | ICD-10-CM | POA: Diagnosis not present

## 2014-10-18 DIAGNOSIS — M6281 Muscle weakness (generalized): Secondary | ICD-10-CM | POA: Diagnosis not present

## 2014-10-18 LAB — URINE CULTURE

## 2014-10-19 ENCOUNTER — Telehealth: Payer: Self-pay | Admitting: Internal Medicine

## 2014-10-19 DIAGNOSIS — M159 Polyosteoarthritis, unspecified: Secondary | ICD-10-CM

## 2014-10-19 DIAGNOSIS — E785 Hyperlipidemia, unspecified: Secondary | ICD-10-CM | POA: Diagnosis not present

## 2014-10-19 DIAGNOSIS — I6529 Occlusion and stenosis of unspecified carotid artery: Secondary | ICD-10-CM

## 2014-10-19 DIAGNOSIS — I739 Peripheral vascular disease, unspecified: Secondary | ICD-10-CM | POA: Diagnosis not present

## 2014-10-19 DIAGNOSIS — E441 Mild protein-calorie malnutrition: Secondary | ICD-10-CM | POA: Diagnosis not present

## 2014-10-19 DIAGNOSIS — I2699 Other pulmonary embolism without acute cor pulmonale: Secondary | ICD-10-CM

## 2014-10-19 DIAGNOSIS — I10 Essential (primary) hypertension: Secondary | ICD-10-CM | POA: Diagnosis not present

## 2014-10-19 NOTE — Telephone Encounter (Signed)
Faxed Immunization record to them at provided number.

## 2014-10-19 NOTE — Telephone Encounter (Signed)
Malachy Mood calling from [facility] stating that Dr if she received pneumonia va and prevnar vac if so the dates. It can be fax to [phone]. Thank You!

## 2014-10-26 ENCOUNTER — Telehealth: Payer: Self-pay | Admitting: *Deleted

## 2014-10-26 NOTE — Telephone Encounter (Signed)
[place] has requested a visit notes dating closest to May 27,2016. The visit notes must be signed by MD. Fax Number [phone] attn: Caryl Pina

## 2014-10-26 NOTE — Telephone Encounter (Signed)
Printed and prepared for Dr. Gilford Rile to sign tomorrow and Almyra Free will fax to [place].

## 2014-11-02 ENCOUNTER — Ambulatory Visit: Payer: Self-pay | Admitting: Neurology

## 2014-11-03 DIAGNOSIS — Y792 Prosthetic and other implants, materials and accessory orthopedic devices associated with adverse incidents: Secondary | ICD-10-CM | POA: Diagnosis not present

## 2014-11-03 DIAGNOSIS — R0781 Pleurodynia: Secondary | ICD-10-CM | POA: Diagnosis not present

## 2014-11-06 ENCOUNTER — Encounter: Payer: Self-pay | Admitting: Neurology

## 2014-11-09 DIAGNOSIS — I739 Peripheral vascular disease, unspecified: Secondary | ICD-10-CM | POA: Diagnosis not present

## 2014-11-09 DIAGNOSIS — E441 Mild protein-calorie malnutrition: Secondary | ICD-10-CM | POA: Diagnosis not present

## 2014-11-09 DIAGNOSIS — I2699 Other pulmonary embolism without acute cor pulmonale: Secondary | ICD-10-CM | POA: Diagnosis not present

## 2014-11-09 DIAGNOSIS — I10 Essential (primary) hypertension: Secondary | ICD-10-CM | POA: Diagnosis not present

## 2014-11-14 ENCOUNTER — Telehealth: Payer: Self-pay | Admitting: Neurology

## 2014-11-14 NOTE — Telephone Encounter (Signed)
Rn call Krista Ellis back patients daughter who is on her DPR form to be contacted. Krista Ellis stated her mom was a no show on her appt with Krista Ellis for a follow up. Pt did see Krista Ellis on 07-04-14 for subdural hematoma. Pt is currently at [facility]. Pt has had a blood clot, urinary trach infection, and broke her pelvis in June 2016. Pts daughter wanted to know if Krista Ellis or Krista Ellis needs to see the patient. She is currently on elquis which is a blood thinner. Rn stated a message will be sent to Krista Ellis. Pt was a no show on 11-02-14.She is receiving mobility therapy at the nursing home.

## 2014-11-14 NOTE — Telephone Encounter (Signed)
She was seen in hospital in 04/2014 for SDH and seizure. She broken her hip in 06/2014 and had recent PE was put on eliquis. She is in SNF now. If she is neurologically stable, she does not need to come in. She should follow up with PCP or doctors in SNF, if they have concerns and want her to be seen by neurology, please let daughter gave [place] a call for appointment. Thanks.  Rosalin Hawking, MD PhD Stroke Neurology 11/14/2014 6:09 PM

## 2014-11-14 NOTE — Telephone Encounter (Signed)
Lft vm for patients daughter Ronald Pippins on advice from Dr. Erlinda Hong. Rn explain that Dr. Erlinda Hong stated pt seems stable from a neurology stand point. Rn also left on daughters vm that if pt has any new neurological issues she can schedule an appointment with [facility].

## 2014-11-14 NOTE — Telephone Encounter (Signed)
Pt's daughter called and does not know if she should bring her mother in for her f/u appt. Pt is now at [facility]. Pt was a no show for last appt due to complications. May call daughter at [phone]

## 2014-12-13 DIAGNOSIS — M1990 Unspecified osteoarthritis, unspecified site: Secondary | ICD-10-CM | POA: Diagnosis not present

## 2014-12-13 DIAGNOSIS — G9009 Other idiopathic peripheral autonomic neuropathy: Secondary | ICD-10-CM

## 2014-12-13 DIAGNOSIS — E43 Unspecified severe protein-calorie malnutrition: Secondary | ICD-10-CM

## 2014-12-13 DIAGNOSIS — E785 Hyperlipidemia, unspecified: Secondary | ICD-10-CM | POA: Diagnosis not present

## 2014-12-13 DIAGNOSIS — I10 Essential (primary) hypertension: Secondary | ICD-10-CM | POA: Diagnosis not present

## 2014-12-13 DIAGNOSIS — E039 Hypothyroidism, unspecified: Secondary | ICD-10-CM | POA: Diagnosis not present

## 2014-12-13 DIAGNOSIS — I739 Peripheral vascular disease, unspecified: Secondary | ICD-10-CM

## 2014-12-13 DIAGNOSIS — I2690 Septic pulmonary embolism without acute cor pulmonale: Secondary | ICD-10-CM

## 2014-12-19 ENCOUNTER — Telehealth: Payer: Self-pay | Admitting: *Deleted

## 2014-12-19 NOTE — Telephone Encounter (Signed)
Robyn Haber from [facility] has requested verification of a pneumonia vaccine for patient.

## 2014-12-19 NOTE — Telephone Encounter (Signed)
Fax over pneumonia dates 12/01/2007

## 2014-12-27 DIAGNOSIS — S5011XA Contusion of right forearm, initial encounter: Secondary | ICD-10-CM | POA: Diagnosis not present

## 2014-12-28 ENCOUNTER — Encounter: Payer: Self-pay | Admitting: Emergency Medicine

## 2014-12-28 ENCOUNTER — Telehealth: Payer: Self-pay | Admitting: Internal Medicine

## 2014-12-28 ENCOUNTER — Emergency Department: Payer: Medicare Other

## 2014-12-28 ENCOUNTER — Emergency Department
Admission: EM | Admit: 2014-12-28 | Discharge: 2014-12-28 | Disposition: A | Discharge status: Home / Self Care | Payer: Medicare Other | Attending: Emergency Medicine | Admitting: Emergency Medicine

## 2014-12-28 DIAGNOSIS — N3001 Acute cystitis with hematuria: Secondary | ICD-10-CM | POA: Diagnosis not present

## 2014-12-28 DIAGNOSIS — R51 Headache: Secondary | ICD-10-CM | POA: Insufficient documentation

## 2014-12-28 DIAGNOSIS — N390 Urinary tract infection, site not specified: Secondary | ICD-10-CM

## 2014-12-28 DIAGNOSIS — R6889 Other general symptoms and signs: Secondary | ICD-10-CM | POA: Diagnosis not present

## 2014-12-28 DIAGNOSIS — Z87442 Personal history of urinary calculi: Secondary | ICD-10-CM | POA: Diagnosis not present

## 2014-12-28 DIAGNOSIS — I10 Essential (primary) hypertension: Secondary | ICD-10-CM | POA: Insufficient documentation

## 2014-12-28 DIAGNOSIS — R109 Unspecified abdominal pain: Secondary | ICD-10-CM

## 2014-12-28 DIAGNOSIS — Z79899 Other long term (current) drug therapy: Secondary | ICD-10-CM | POA: Diagnosis not present

## 2014-12-28 DIAGNOSIS — Z743 Need for continuous supervision: Secondary | ICD-10-CM | POA: Diagnosis not present

## 2014-12-28 DIAGNOSIS — R300 Dysuria: Secondary | ICD-10-CM | POA: Diagnosis not present

## 2014-12-28 DIAGNOSIS — Z792 Long term (current) use of antibiotics: Secondary | ICD-10-CM | POA: Insufficient documentation

## 2014-12-28 DIAGNOSIS — Z791 Long term (current) use of non-steroidal anti-inflammatories (NSAID): Secondary | ICD-10-CM | POA: Diagnosis not present

## 2014-12-28 DIAGNOSIS — K8020 Calculus of gallbladder without cholecystitis without obstruction: Secondary | ICD-10-CM | POA: Diagnosis not present

## 2014-12-28 LAB — COMPREHENSIVE METABOLIC PANEL
ALT: 22 U/L (ref 14–54)
AST: 39 U/L (ref 15–41)
Albumin: 3.9 g/dL (ref 3.5–5.0)
Alkaline Phosphatase: 46 U/L (ref 38–126)
Anion gap: 8 (ref 5–15)
BILIRUBIN TOTAL: 0.8 mg/dL (ref 0.3–1.2)
BUN: 29 mg/dL — AB (ref 6–20)
CALCIUM: 9.6 mg/dL (ref 8.9–10.3)
CO2: 25 mmol/L (ref 22–32)
Chloride: 105 mmol/L (ref 101–111)
Creatinine, Ser: 0.65 mg/dL (ref 0.44–1.00)
GFR calc Af Amer: >60 mL/min (ref >60)
Glucose, Bld: 104 mg/dL — ABNORMAL HIGH (ref 65–99)
POTASSIUM: 4.2 mmol/L (ref 3.5–5.1)
Sodium: 138 mmol/L (ref 135–145)
TOTAL PROTEIN: 6.9 g/dL (ref 6.5–8.1)

## 2014-12-28 LAB — URINALYSIS COMPLETE WITH MICROSCOPIC (ARMC ONLY)
Bilirubin Urine: NEGATIVE
Glucose, UA: NEGATIVE mg/dL
Ketones, ur: NEGATIVE mg/dL
Nitrite: NEGATIVE
Protein, ur: 30 mg/dL — AB
Specific Gravity, Urine: 1.006 (ref 1.005–1.030)
pH: 5 (ref 5.0–8.0)

## 2014-12-28 LAB — CBC WITH DIFFERENTIAL/PLATELET
Basophils Absolute: 0 10*3/uL (ref 0–0.1)
Basophils Relative: 0 %
Eosinophils Absolute: 0 10*3/uL (ref 0–0.7)
Eosinophils Relative: 0 %
HEMATOCRIT: 41.2 % (ref 35.0–47.0)
Hemoglobin: 13.9 g/dL (ref 12.0–16.0)
LYMPHS PCT: 3 %
Lymphs Abs: 0.3 10*3/uL — ABNORMAL LOW (ref 1.0–3.6)
MCH: 31.4 pg (ref 26.0–34.0)
MCHC: 33.7 g/dL (ref 32.0–36.0)
MCV: 93.4 fL (ref 80.0–100.0)
MONO ABS: 0.8 10*3/uL (ref 0.2–0.9)
MONOS PCT: 7 %
NEUTROS ABS: 10.4 10*3/uL — AB (ref 1.4–6.5)
Neutrophils Relative %: 90 %
Platelets: 178 10*3/uL (ref 150–440)
RBC: 4.42 MIL/uL (ref 3.80–5.20)
RDW: 14.3 % (ref 11.5–14.5)
WBC: 11.4 10*3/uL — ABNORMAL HIGH (ref 3.6–11.0)

## 2014-12-28 LAB — LIPASE, BLOOD: LIPASE: 28 U/L (ref 11–51)

## 2014-12-28 MED ORDER — DEXTROSE 5 % IV SOLN
1.0000 g | INTRAVENOUS | Status: AC
  Administered 2014-12-28: 1 g via INTRAVENOUS
  Filled 2014-12-28 (×2): qty 10

## 2014-12-28 MED ORDER — CEPHALEXIN 500 MG PO CAPS: 500.0000 mg | ORAL_CAPSULE | Freq: Two times a day (BID) | ORAL | Status: DC

## 2014-12-28 MED ORDER — HYDROCODONE-ACETAMINOPHEN 5-325 MG PO TABS
1.0000 | ORAL_TABLET | Freq: Once | ORAL | Status: AC
  Administered 2014-12-28: 1 via ORAL
  Filled 2014-12-28: qty 1

## 2014-12-28 NOTE — Discharge Instructions (Signed)
You have been seen in the Emergency Department (ED) for symptoms that we believe are caused by a urinary tract infection (UTI).  Please take your antibiotic as prescribed and over-the-counter pain medication (Tylenol or Motrin) as needed, but no more than recommended on the label instructions.  Drink PLENTY of fluids.  Call your regular doctor to schedule the next available appointment to follow up on todays ED visit, or return immediately to the ED if your pain worsens, you have decreased urine production, develop fever, persistent vomiting, or other symptoms that concern you.   Urinary Tract Infection Urinary tract infections (UTIs) can develop anywhere along your urinary tract. Your urinary tract is your body's drainage system for removing wastes and extra water. Your urinary tract includes two kidneys, two ureters, a bladder, and a urethra. Your kidneys are a pair of bean-shaped organs. Each kidney is about the size of your fist. They are located below your ribs, one on each side of your spine. CAUSES Infections are caused by microbes, which are microscopic organisms, including fungi, viruses, and bacteria. These organisms are so small that they can only be seen through a microscope. Bacteria are the microbes that most commonly cause UTIs. SYMPTOMS  Symptoms of UTIs may vary by age and gender of the patient and by the location of the infection. Symptoms in young women typically include a frequent and intense urge to urinate and a painful, burning feeling in the bladder or urethra during urination. Older women and men are more likely to be tired, shaky, and weak and have muscle aches and abdominal pain. A fever may mean the infection is in your kidneys. Other symptoms of a kidney infection include pain in your back or sides below the ribs, nausea, and vomiting. DIAGNOSIS To diagnose a UTI, your caregiver will ask you about your symptoms. Your caregiver will also ask you to provide a urine sample.  The urine sample will be tested for bacteria and white blood cells. White blood cells are made by your body to help fight infection. TREATMENT  Typically, UTIs can be treated with medication. Because most UTIs are caused by a bacterial infection, they usually can be treated with the use of antibiotics. The choice of antibiotic and length of treatment depend on your symptoms and the type of bacteria causing your infection. HOME CARE INSTRUCTIONS  If you were prescribed antibiotics, take them exactly as your caregiver instructs you. Finish the medication even if you feel better after you have only taken some of the medication.  Drink enough water and fluids to keep your urine clear or pale yellow.  Avoid caffeine, tea, and carbonated beverages. They tend to irritate your bladder.  Empty your bladder often. Avoid holding urine for long periods of time.  Empty your bladder before and after sexual intercourse.  After a bowel movement, women should cleanse from front to back. Use each tissue only once. SEEK MEDICAL CARE IF:   You have back pain.  You develop a fever.  Your symptoms do not begin to resolve within 3 days. SEEK IMMEDIATE MEDICAL CARE IF:   You have severe back pain or lower abdominal pain.  You develop chills.  You have nausea or vomiting.  You have continued burning or discomfort with urination. MAKE SURE YOU:   Understand these instructions.  Will watch your condition.  Will get help right away if you are not doing well or get worse.   This information is not intended to replace advice given to you  by your health care provider. Make sure you discuss any questions you have with your health care provider.   Document Released: 10/16/2004 Document Revised: 09/27/2014 Document Reviewed: 02/14/2011 Elsevier Interactive Patient Education [employer].

## 2014-12-28 NOTE — Telephone Encounter (Signed)
[place]    --------------------------------------------------------------------------------   Patient Name: Krista Ellis  Gender: Female  DOB: [birth year]   Age: 81 Y 76 M 23 D  Return Phone Number:    Address:     City/State/Zip:  [place]     Client [facility] - Client  Client Site [facility]  Physician Viviana Simpler   Contact Type Call  Call Type Triage / Clinical  Caller Name Caryl Pina [phone]  Relationship To Patient Care Giver  Appointment Disposition EMR Appointment Not Necessary  Return Phone Number Please choose phone number  Chief Complaint Paging or Request for Consult  Initial Comment Caller states she is RN with [facility], thinks they need to send pt out for treatment.  PreDisposition Go to ED       Nurse Assessment  Nurse: Amalia Hailey, RN, Melissa Date/Time (Eastern Time): 12/28/2014 5:47:18 PM  Confirm and document reason for call. If symptomatic, describe symptoms. ---Caller states she is RN with [facility], thinks they need to send pt out for treatment.    Has the patient traveled out of the country within the last 30 days? ---Not Applicable    Does the patient have any new or worsening symptoms? ---Yes    Will a triage be completed? ---Yes    Related visit to physician within the last 2 weeks? ---Yes    Does the PT have any chronic conditions? (i.e. diabetes, asthma, etc.) ---Unknown    Is this a behavioral health or substance abuse call? ---No           Guidelines          Guideline Title Affirmed Question Affirmed Notes Nurse Date/Time (Eastern Time)  Urinary Tract Infection on Antibiotic Follow-up Call - Female Patient sounds very sick or weak to the triager    Amalia Hailey, RN, Lenna Sciara 12/28/2014 5:48:05 PM    Disp. Time Eilene Ghazi Time) Disposition Final User    12/28/2014 5:44:11 PM Called On-Call Provider    Amalia Hailey, RN, Melissa      12/28/2014 5:48:36 PM Go to ED Now (or PCP triage) Lanelle Bal, RN, Lenna Sciara            Caller Understands: Yes  Disagree/Comply: Comply       Care Advice Given Per Guideline        GO TO ED NOW (OR PCP TRIAGE):    After Care Instructions Given        Call Event Type User Date / Time Description        --------------------------------------------------------------------------------            Referrals  [facility] - ED    Paging            DoctorName Phone DateTime Result/Outcome Message Type Notes  Cathlean Cower  [id number] 12/28/2014 5:44:11 PM Called On Call Provider - Reached Doctor Paged    Cathlean Cower    12/28/2014 5:46:46 PM Spoke with On Call - General Message Result informed of the caller's (RN @ nursing home) concern , reports the pt was started on Macrobid today for UTI but since then she is shakey, confused, Resp. 30, O2Sats are good, Temp 97.5. Concerned she may be getting septic and needs an order to send her out to be evaluated. Dr.John give the order to send her out and the caller was informed of this order.

## 2014-12-28 NOTE — ED Provider Notes (Signed)
[facility] Emergency Department Provider Note  ____________________________________________  Time seen: Approximately 8:35 PM  I have reviewed the triage vital signs and the nursing notes.   HISTORY  Chief Complaint Facial Pain; Hip Pain; Headache; and Urinary Tract Infection    HPI LACRICIA TOLLISON is a 81 y.o. female with a past medical history of sepsis due to UTI and chronic kidney stones who presents by EMS with a variety of complaints today from [place] facility.  She has had dark-colored urine and was started today on Macrobid.  She also complained of both a headache and left-sided facial pain (at different times) today, but both have resolved.  She denies any numbness/weakness/dizziness.  She denies fever/chills, CP, SOB, dysuria.  She has had some left lateral abd pain that feels like her prior kidney stone.  Overall she describes the symptoms as mild, nothing makes them better nor worse.   Past Medical History  Diagnosis Date  . Hypertension   . Hyperlipidemia   . Arthritis   . Hemihypertrophy   . Myocardial infarction ([place])   . Thyroid disease   . CVA (cerebral infarction) 2003  . Peripheral vascular disease ([place])     s/p stent right leg  . Pulmonary nodule     stable on Chest CT March 2014, Follow[facility] at [facility]  . Murmur, cardiac   . Chronic cystitis   . Hematuria   . Bleeding disorder ([place])   . Recurrent UTI   . Gross hematuria   . Urinary frequency   . Dysuria   . Sensory urge incontinence   . History of kidney stones     Patient Active Problem List   Diagnosis Date Noted  . Pulmonary embolism ([place]) 09/22/2014  . UTI (urinary tract infection) 09/14/2014  . Enterococcus UTI 09/14/2014  . Generalized weakness 09/14/2014  . Sepsis ([place]) 09/10/2014  . Open wound of knee, leg (except thigh), and ankle, complicated [id number]  . Hospital discharge follow-up 07/04/2014  . Facial droop   . Malnutrition of moderate degree  ([place]) 05/21/2014  . Seizures ([place])   . TIA (transient ischemic attack) 05/18/2014  . Systolic murmur [id number]  . Gait disturbance 05/15/2014  . History of subdural hematoma   . Subdural hematoma ([place]) 05/05/2014  . B12 deficiency 03/09/2014  . Recurrent UTI 01/03/2014  . Bilateral hand numbness 01/03/2014  . Nephrolithiasis 11/18/2013  . Recurrent falls 11/18/2013  . Abnormal CT scan, chest 09/01/2013  . Chest wall pain 08/31/2013  . Hematoma and contusion 08/31/2013  . Atherosclerotic peripheral vascular disease ([place]) 06/01/2013  . Bladder wall hemorrhage 12/10/2012  . Postmenopausal estrogen deficiency 11/30/2012  . Medicare annual wellness visit, subsequent 10/01/2011  . Dyslipidemia 10/01/2011  . Hypertension 10/17/2010  . Hypothyroidism 10/17/2010  . Osteoarthritis 10/17/2010    Past Surgical History  Procedure Laterality Date  . Abdominal hysterectomy  1982  . Total hip arthroplasty      right  . Spine surgery      steel rod in back  . Tonsillectomy    . Left rotator cuff      repair  . Total knee arthroplasty Left   . Hip surgery  2007    replaced ball in right hip  . Cardiac surgery      2 failed stents in right leg  . Stent placement leg Right 2015  . Carpal tunnel release    . Appendectomy    . Ruptured disc      Current Outpatient  Rx  Name  Route  Sig  Dispense  Refill  . acetaminophen (TYLENOL) 325 MG tablet   Oral   Take 325 mg by mouth every 6 (six) hours as needed for mild pain, moderate pain or headache.          Marland Kitchen acetaminophen (TYLENOL) 650 MG CR tablet   Oral   Take 650 mg by mouth 3 (three) times daily.         Marland Kitchen amLODipine (NORVASC) 5 MG tablet   Oral   Take 5 mg by mouth daily.         Marland Kitchen apixaban (ELIQUIS) 5 MG TABS tablet   Oral   Take 1 tablet (5 mg total) by mouth 2 (two) times daily.   60 tablet   0   . cyanocobalamin (,VITAMIN B-12,) 1000 MCG/ML injection   Intramuscular   Inject 1,000 mcg into the muscle every 30  (thirty) days. On the 2nd of each month         . guaiFENesin-dextromethorphan (ROBITUSSIN DM) 100-10 MG/5ML syrup   Oral   Take 10 mLs by mouth every 4 (four) hours as needed for cough.         . levothyroxine (SYNTHROID, LEVOTHROID) 25 MCG tablet      TAKE 1 TABLET (25 MCG TOTAL) BY MOUTH DAILY. Patient taking differently: Take 25 mcg by mouth daily before breakfast.    90 tablet   1   . loratadine (CLARITIN) 10 MG tablet   Oral   Take 1 tablet (10 mg total) by mouth daily. Patient taking differently: Take 10 mg by mouth daily as needed for allergies.          . magnesium hydroxide (MILK OF MAGNESIA) 400 MG/5ML suspension   Oral   Take 30 mLs by mouth daily as needed for mild constipation or moderate constipation.         . metoprolol succinate (TOPROL-XL) 25 MG 24 hr tablet   Oral   Take 12.5 mg by mouth daily.          . polyethylene glycol (MIRALAX / GLYCOLAX) packet   Oral   Take 17 g by mouth daily as needed for mild constipation, moderate constipation or severe constipation.         . pravastatin (PRAVACHOL) 40 MG tablet   Oral   Take 1 tablet (40 mg total) by mouth every evening. Patient taking differently: Take 40 mg by mouth at bedtime.    90 tablet   1   . senna-docusate (SENOKOT-S) 8.6-50 MG tablet   Oral   Take 1 tablet by mouth 2 (two) times daily.         . sodium chloride (OCEAN) 0.65 % SOLN nasal spray   Each Nare   Place 1 spray into both nostrils every 2 (two) hours as needed for congestion.         . traMADol (ULTRAM) 50 MG tablet   Oral   Take 25 mg by mouth every 8 (eight) hours as needed for moderate pain or severe pain.         . cephALEXin (KEFLEX) 500 MG capsule   Oral   Take 1 capsule (500 mg total) by mouth 2 (two) times daily.   14 capsule   0   . docusate sodium (COLACE) 100 MG capsule   Oral   Take 1 capsule (100 mg total) by mouth 2 (two) times daily as needed for mild constipation. Patient not taking:  Reported  on 12/28/2014   10 capsule   0   . feeding supplement, ENSURE ENLIVE, (ENSURE ENLIVE) LIQD   Oral   Take 237 mLs by mouth 2 (two) times daily after a meal. Patient not taking: Reported on 12/28/2014   237 mL   12   . oxyCODONE-acetaminophen (ROXICET) 5-325 MG per tablet   Oral   Take 1 tablet by mouth every 6 (six) hours as needed. Patient not taking: Reported on 12/28/2014   20 tablet   0   . pregabalin (LYRICA) 50 MG capsule   Oral   Take 1 capsule (50 mg total) by mouth 2 (two) times daily. Patient not taking: Reported on 12/28/2014   60 capsule   6   . senna (SENOKOT) 8.6 MG TABS tablet   Oral   Take 1 tablet (8.6 mg total) by mouth daily as needed for mild constipation. Patient not taking: Reported on 12/28/2014   120 each   0     Allergies Sulfa antibiotics; Ciprofloxacin; and Levofloxacin  Family History  Problem Relation Age of Onset  . Hypertension Mother   . Transient ischemic attack Mother   . Kidney disease Neg Hx   . Bladder Cancer Neg Hx   . Prostate cancer Neg Hx     Social History Social History  Substance Use Topics  . Smoking status: Never Smoker   . Smokeless tobacco: Never Used  . Alcohol Use: No    Review of Systems Constitutional: No fever/chills Eyes: No visual changes. ENT: No sore throat.  Left sided facial pain, now resolved Cardiovascular: Denies chest pain. Respiratory: Denies shortness of breath. Gastrointestinal: Left lateral abd pain.  No nausea, no vomiting.  No diarrhea.  No constipation. Genitourinary: Negative for dysuria. Musculoskeletal: Negative for back pain. Skin: Negative for rash. Neurological: Negative for headaches, focal weakness or numbness.  10-point ROS otherwise negative.  ____________________________________________   PHYSICAL EXAM:  VITAL SIGNS: ED Triage Vitals  Enc Vitals Group     BP 12/28/14 1840 149/53 mmHg     Pulse Rate 12/28/14 1840 96     Resp 12/28/14 1840 18     Temp  12/28/14 1840 96 F (35.6 C)     Temp src --      SpO2 12/28/14 1840 94 %     Weight 12/28/14 1840 110 lb (49.896 kg)     Height 12/28/14 1840 5' (1.524 m)     Head Cir --      Peak Flow --      Pain Score 12/28/14 1841 10     Pain Loc --      Pain Edu? --      Excl. in [place]? --     Constitutional: Alert and oriented, at baseline per daughter. Well appearing elderly woman in no acute distress. Eyes: Conjunctivae are normal. PERRL. EOMI. Head: Atraumatic. Nose: No congestion/rhinnorhea. Mouth/Throat: Mucous membranes are moist.  Oropharynx non-erythematous. Neck: No stridor.   Cardiovascular: Normal rate, regular rhythm. Grossly normal heart sounds.  Good peripheral circulation. Respiratory: Normal respiratory effort.  No retractions. Lungs CTAB. Gastrointestinal: Soft, mild non-focal TTP of left lateral abd. No distention. No abdominal bruits. No CVA tenderness. Musculoskeletal: No lower extremity tenderness nor edema.  No joint effusions. Neurologic:  Normal speech and language. No gross focal neurologic deficits are appreciated.  Skin:  Skin is warm, dry and intact. No rash noted. Psychiatric: Mood and affect are normal. Speech and behavior are normal.  ____________________________________________   LABS (all labs  ordered are listed, but only abnormal results are displayed)  Labs Reviewed  URINALYSIS COMPLETEWITH MICROSCOPIC ([facility] ONLY) - Abnormal; Notable for the following:    Color, Urine YELLOW (*)    APPearance CLOUDY (*)    Hgb urine dipstick 3+ (*)    Protein, ur 30 (*)    Leukocytes, UA 3+ (*)    Bacteria, UA RARE (*)    Squamous Epithelial / LPF 0-5 (*)    All other components within normal limits  CBC WITH DIFFERENTIAL/PLATELET - Abnormal; Notable for the following:    WBC 11.4 (*)    Neutro Abs 10.4 (*)    Lymphs Abs 0.3 (*)    All other components within normal limits  COMPREHENSIVE METABOLIC PANEL - Abnormal; Notable for the following:    Glucose, Bld 104  (*)    BUN 29 (*)    All other components within normal limits  URINE CULTURE  LIPASE, BLOOD  Urine RBCs and WBCs = TNTC ____________________________________________  EKG  Not obtained ____________________________________________  RADIOLOGY   Ct Renal Stone Study  12/28/2014  CLINICAL DATA:  Left lateral abdominal pain. Urinary tract infection. Nephrolithiasis. EXAM: CT ABDOMEN AND PELVIS WITHOUT CONTRAST TECHNIQUE: Multidetector CT imaging of the abdomen and pelvis was performed following the standard protocol without IV contrast. COMPARISON:  09/10/2014 FINDINGS: Lower chest:  No acute findings. Hepatobiliary: Probable tiny calcified gallstone noted, however there is no evidence of cholecystitis or biliary ductal dilatation. No liver mass visualized on this unenhanced exam. Pancreas: No mass or inflammatory process identified on this un-enhanced exam. Spleen: Within normal limits in size. Adrenals/Urinary Tract: Unremarkable adrenal glands. 4 mm calculus again seen in lower pole of the left kidney. No evidence hydronephrosis. No evidence of ureteral calculi or dilatation. Gas is noted within urinary bladder, likely due to recent catheterization. Stomach/Bowel: No evidence of obstruction, inflammatory process, or abnormal fluid collections. Sigmoid diverticulosis is again demonstrated, without evidence of diverticulitis. Moderate colonic stool noted. Vascular/Lymphatic: No pathologically enlarged lymph nodes. No evidence of abdominal aortic aneurysm. Reproductive: No mass or other significant abnormality. Other: Artifact from right hip prosthesis obscures inferior portion of pelvis. Musculoskeletal: No suspicious bone lesions identified. Severe lumbar spondylosis seen with fusion hardware at L4-5. IMPRESSION: 4 mm nonobstructive left renal calculus. No evidence of ureteral calculi, hydronephrosis, or other acute findings. Gas within urinary bladder, likely due to recent instrumentation ; suggest  clinical correlation. Colonic diverticulosis. No radiographic evidence of diverticulitis. Cholelithiasis.  No radiographic evidence of cholecystitis. Electronically Signed   By: Earle Gell M.D.   On: 12/28/2014 21:15    ____________________________________________   PROCEDURES  Procedure(s) performed: None  Critical Care performed: No ____________________________________________   INITIAL IMPRESSION / ASSESSMENT AND PLAN / ED COURSE  Pertinent labs & imaging results that were available during my care of the patient were reviewed by me and considered in my medical decision making (see chart for details).  Patient has TNTC RBCs and WBCs with a history of kidney stones.  I will treat with ceftriaxone, obtaining a urine culture, and check a CT renal study protocol to rule out an infected/impacted ureteral stone given the associated morbidity and mortality of this misdiagnosis.  ----------------------------------------- 10:53 PM on 12/28/2014 -----------------------------------------  No sign of ureteral stone.  The patient is well-appearing with mild tachycardia but a known infection.  She and family agree with the plan for outpatient antibiotics and follow up.    I gave my usual and customary return precautions.     ____________________________________________  FINAL CLINICAL IMPRESSION(S) / ED DIAGNOSES  Final diagnoses:  UTI (lower urinary tract infection)  Personal history of kidney stones  Left lateral abdominal pain      NEW MEDICATIONS STARTED DURING THIS VISIT:  New Prescriptions   CEPHALEXIN (KEFLEX) 500 MG CAPSULE    Take 1 capsule (500 mg total) by mouth 2 (two) times daily.     Hinda Kehr, MD 12/28/14 2257

## 2014-12-28 NOTE — ED Notes (Signed)
ED secretary to call EMS for transport.  ?

## 2014-12-28 NOTE — ED Notes (Signed)
Pt to ER via EMS from [facility].  Reports that pt has dark colored urine for last 2 days, started on Macrobid today.  Pt awoke with HA this AM, but that is resolved.  Reports of left hip pain, but that is chronic.  Reports of left facial pain.  Hx of TIA, no neuro deficits noted at this time.  Pt also reports left arm pain on examination, this may also be chronic.

## 2014-12-29 MED ORDER — PROMETHAZINE HCL 25 MG/ML IJ SOLN
INTRAMUSCULAR | Status: AC
  Filled 2014-12-29: qty 1

## 2014-12-29 NOTE — Telephone Encounter (Signed)
Had been well on my exam earlier in the day Will follow up at [facility]

## 2014-12-31 LAB — URINE CULTURE: SPECIAL REQUESTS: NORMAL

## 2015-01-11 DIAGNOSIS — I10 Essential (primary) hypertension: Secondary | ICD-10-CM | POA: Diagnosis not present

## 2015-01-11 DIAGNOSIS — D649 Anemia, unspecified: Secondary | ICD-10-CM | POA: Diagnosis not present

## 2015-02-01 DIAGNOSIS — N3000 Acute cystitis without hematuria: Secondary | ICD-10-CM | POA: Diagnosis not present

## 2015-02-06 DIAGNOSIS — N390 Urinary tract infection, site not specified: Secondary | ICD-10-CM | POA: Diagnosis not present

## 2015-02-26 ENCOUNTER — Emergency Department
Admission: EM | Admit: 2015-02-26 | Discharge: 2015-02-26 | Disposition: A | Discharge status: Home / Self Care | Payer: Medicare Other | Attending: Emergency Medicine | Admitting: Emergency Medicine

## 2015-02-26 ENCOUNTER — Emergency Department: Payer: Medicare Other

## 2015-02-26 ENCOUNTER — Telehealth: Payer: Self-pay

## 2015-02-26 ENCOUNTER — Encounter: Payer: Self-pay | Admitting: Emergency Medicine

## 2015-02-26 DIAGNOSIS — R6889 Other general symptoms and signs: Secondary | ICD-10-CM | POA: Diagnosis not present

## 2015-02-26 DIAGNOSIS — R109 Unspecified abdominal pain: Secondary | ICD-10-CM | POA: Diagnosis present

## 2015-02-26 DIAGNOSIS — R1084 Generalized abdominal pain: Secondary | ICD-10-CM | POA: Diagnosis not present

## 2015-02-26 DIAGNOSIS — I10 Essential (primary) hypertension: Secondary | ICD-10-CM | POA: Diagnosis not present

## 2015-02-26 DIAGNOSIS — R1030 Lower abdominal pain, unspecified: Secondary | ICD-10-CM

## 2015-02-26 DIAGNOSIS — R300 Dysuria: Secondary | ICD-10-CM | POA: Diagnosis not present

## 2015-02-26 DIAGNOSIS — Z880 Allergy status to penicillin: Secondary | ICD-10-CM | POA: Diagnosis not present

## 2015-02-26 DIAGNOSIS — Z79899 Other long term (current) drug therapy: Secondary | ICD-10-CM | POA: Insufficient documentation

## 2015-02-26 DIAGNOSIS — K5730 Diverticulosis of large intestine without perforation or abscess without bleeding: Secondary | ICD-10-CM | POA: Diagnosis not present

## 2015-02-26 LAB — COMPREHENSIVE METABOLIC PANEL
ALK PHOS: 52 U/L (ref 38–126)
ALT: 16 U/L (ref 14–54)
AST: 24 U/L (ref 15–41)
Albumin: 3.7 g/dL (ref 3.5–5.0)
Anion gap: 7 (ref 5–15)
BUN: 26 mg/dL — AB (ref 6–20)
CALCIUM: 9.6 mg/dL (ref 8.9–10.3)
CO2: 27 mmol/L (ref 22–32)
CREATININE: 0.68 mg/dL (ref 0.44–1.00)
Chloride: 106 mmol/L (ref 101–111)
Glucose, Bld: 108 mg/dL — ABNORMAL HIGH (ref 65–99)
Potassium: 3.9 mmol/L (ref 3.5–5.1)
Sodium: 140 mmol/L (ref 135–145)
Total Bilirubin: 0.5 mg/dL (ref 0.3–1.2)
Total Protein: 7.3 g/dL (ref 6.5–8.1)

## 2015-02-26 LAB — URINALYSIS COMPLETE WITH MICROSCOPIC (ARMC ONLY)
BILIRUBIN URINE: NEGATIVE
Bacteria, UA: NONE SEEN
GLUCOSE, UA: NEGATIVE mg/dL
KETONES UR: NEGATIVE mg/dL
Nitrite: NEGATIVE
PROTEIN: 100 mg/dL — AB
SPECIFIC GRAVITY, URINE: 1.01 (ref 1.005–1.030)
pH: 7 (ref 5.0–8.0)

## 2015-02-26 LAB — CBC
HCT: 42.2 % (ref 35.0–47.0)
Hemoglobin: 14 g/dL (ref 12.0–16.0)
MCH: 31.4 pg (ref 26.0–34.0)
MCHC: 33 g/dL (ref 32.0–36.0)
MCV: 94.9 fL (ref 80.0–100.0)
PLATELETS: 203 10*3/uL (ref 150–440)
RBC: 4.45 MIL/uL (ref 3.80–5.20)
RDW: 12.6 % (ref 11.5–14.5)
WBC: 6.3 10*3/uL (ref 3.6–11.0)

## 2015-02-26 MED ORDER — OXYBUTYNIN CHLORIDE 5 MG PO TABS
5.0000 mg | ORAL_TABLET | Freq: Three times a day (TID) | ORAL | Status: DC
  Filled 2015-02-26 (×3): qty 1

## 2015-02-26 MED ORDER — PHENAZOPYRIDINE HCL 100 MG PO TABS: 100.0000 mg | ORAL_TABLET | Freq: Three times a day (TID) | ORAL | Status: DC | PRN

## 2015-02-26 MED ORDER — IOHEXOL 300 MG/ML  SOLN
100.0000 mL | Freq: Once | INTRAMUSCULAR | Status: AC | PRN
  Administered 2015-02-26: 100 mL via INTRAVENOUS

## 2015-02-26 MED ORDER — OXYBUTYNIN CHLORIDE ER 10 MG PO TB24: 10.0000 mg | ORAL_TABLET | Freq: Every day | ORAL | Status: DC

## 2015-02-26 MED ORDER — PHENAZOPYRIDINE HCL 100 MG PO TABS
95.0000 mg | ORAL_TABLET | Freq: Once | ORAL | Status: AC
  Administered 2015-02-26: 100 mg via ORAL
  Filled 2015-02-26: qty 1

## 2015-02-26 MED ORDER — IOHEXOL 240 MG/ML SOLN
25.0000 mL | Freq: Once | INTRAMUSCULAR | Status: AC | PRN
  Administered 2015-02-26: 25 mL via ORAL

## 2015-02-26 MED ORDER — OXYBUTYNIN CHLORIDE 5 MG PO TABS
5.0000 mg | ORAL_TABLET | Freq: Once | ORAL | Status: AC
  Administered 2015-02-26: 5 mg via ORAL
  Filled 2015-02-26: qty 1

## 2015-02-26 MED ORDER — PHENAZOPYRIDINE HCL 200 MG PO TABS
ORAL_TABLET | ORAL | Status: AC
  Filled 2015-02-26: qty 1

## 2015-02-26 MED ORDER — OXYBUTYNIN CHLORIDE 5 MG PO TABS
2.5000 mg | ORAL_TABLET | Freq: Two times a day (BID) | ORAL | Status: DC
Start: 2015-02-26 — End: 2016-05-15

## 2015-02-26 NOTE — Telephone Encounter (Signed)
Per chart review tab pt seen [facility] ED on 02/26/15.

## 2015-02-26 NOTE — Discharge Instructions (Signed)
Abdominal Pain, Adult °Many things can cause abdominal pain. Usually, abdominal pain is not caused by a disease and will improve without treatment. It can often be observed and treated at home. Your health care provider will do a physical exam and possibly order blood tests and X-rays to help determine the seriousness of your pain. However, in many cases, more time must pass before a clear cause of the pain can be found. Before that point, your health care provider may not know if you need more testing or further treatment. °HOME CARE INSTRUCTIONS °Monitor your abdominal pain for any changes. The following actions may help to alleviate any discomfort you are experiencing: °· Only take over-the-counter or prescription medicines as directed by your health care provider. °· Do not take laxatives unless directed to do so by your health care provider. °· Try a clear liquid diet (broth, tea, or water) as directed by your health care provider. Slowly move to a bland diet as tolerated. °SEEK MEDICAL CARE IF: °· You have unexplained abdominal pain. °· You have abdominal pain associated with nausea or diarrhea. °· You have pain when you urinate or have a bowel movement. °· You experience abdominal pain that wakes you in the night. °· You have abdominal pain that is worsened or improved by eating food. °· You have abdominal pain that is worsened with eating fatty foods. °· You have a fever. °SEEK IMMEDIATE MEDICAL CARE IF: °· Your pain does not go away within 2 hours. °· You keep throwing up (vomiting). °· Your pain is felt only in portions of the abdomen, such as the right side or the left lower portion of the abdomen. °· You pass bloody or black tarry stools. °MAKE SURE YOU: °· Understand these instructions. °· Will watch your condition. °· Will get help right away if you are not doing well or get worse. °  °This information is not intended to replace advice given to you by your health care provider. Make sure you discuss  any questions you have with your health care provider. °  °Document Released: 10/16/2004 Document Revised: 09/27/2014 Document Reviewed: 09/15/2012 °Elsevier Interactive Patient Education ©2016 Else[employer] Inc[employer] ° °Dysuria °Dysuria is pain or discomfort while urinating. The pain or discomfort may be felt in the tube that carries urine out of the bladder (urethra) or in the surrounding tissue of the genitals. The pain may also be felt in the groin area, lower abdomen, and lower back. You may have to urinate frequently or have the sudden feeling that you have to urinate (urgency). Dysuria can affect both men and women, but is more common in women. °Dysuria can be caused by many different things, including: °· Urinary tract infection in women. °· Infection of the kidney or bladder. °· Kidney stones or bladder stones. °· Certain sexually transmitted infections (STIs), such as chlamydia. °· Dehydration. °· Inflammation of the vagina. °· Use of certain medicines. °· Use of certain soaps or scented products that cause irritation. °HOME CARE INSTRUCTIONS °Watch your dysuria for any changes. The following actions may help to reduce any discomfort you are feeling: °· Drink enough fluid to keep your urine clear or pale yellow. °· Empty your bladder often. Avoid holding urine for long periods of time. °· After a bowel movement or urination, women should cleanse from front to back, using each tissue only once. °· Empty your bladder after sexual intercourse. °· Take medicines only as directed by your health care provider. °· If you were prescribed an   antibiotic medicine, finish it all even if you start to feel better. °· Avoid caffeine, tea, and alcohol. They can irritate the bladder and make dysuria worse. In men, alcohol may irritate the prostate. °· Keep all follow-up visits as directed by your health care provider. This is important. °· If you had any tests done to find the cause of dysuria, it is your responsibility to obtain  your test results. Ask the lab or department performing the test when and how you will get your results. Talk with your health care provider if you have any questions about your results. °SEEK MEDICAL CARE IF: °· You develop pain in your back or sides. °· You have a fever. °· You have nausea or vomiting. °· You have blood in your urine. °· You are not urinating as often as you usually do. °SEEK IMMEDIATE MEDICAL CARE IF: °· You pain is severe and not relieved with medicines. °· You are unable to hold down any fluids. °· You or someone else notices a change in your mental function. °· You have a rapid heartbeat at rest. °· You have shaking or chills. °· You feel extremely weak. °  °This information is not intended to replace advice given to you by your health care provider. Make sure you discuss any questions you have with your health care provider. °  °Document Released: 10/05/2003 Document Revised: 01/27/2014 Document Reviewed: 09/01/2013 °Elsevier Interactive Patient Education ©2016 Elsevier Inc. ° °

## 2015-02-26 NOTE — Telephone Encounter (Signed)
Came back Being set up with urology for persistent dysuria

## 2015-02-26 NOTE — ED Notes (Signed)
Warm blankets provided. Family at bedside.

## 2015-02-26 NOTE — ED Notes (Signed)
Pt from [facility] with several days of lower abd pain and dysuria. Pt denies known fever and denies n/v.

## 2015-02-26 NOTE — ED Notes (Signed)
Report to wendy foster, rn at [facility].

## 2015-02-26 NOTE — ED Notes (Signed)
Pt left with [place] ems back to [facility].

## 2015-02-26 NOTE — ED Provider Notes (Signed)
[facility] Emergency Department Provider Note  ____________________________________________  Time seen: Approximately 2:21 AM  I have reviewed the triage vital signs and the nursing notes.   HISTORY  Chief Complaint Abdominal Pain    HPI Krista Ellis is a 82 y.o. female who comes into the hospital today complaining of UTI. The patient reports that she's been having burning and hurting when she urinates. She reports that she's had frequent UTIs over seemed to go away. She also reports that she is urinating all the time. She reports that also the last 2-3 days she's been having diarrhea whenever she eats and pain about. Area. The patient also has a history of diverticulitis which is concerning for her daughter. The patient was here in December and treated with antibiotics for UTI. She's had no vomiting or fever no chest pain or shortness of breath. She rates her pain a 6 out of 10 in intensity. The patient's daughter was concerned she decided to bring the patient in to get evaluated.   Past Medical History  Diagnosis Date  . Hypertension   . Hyperlipidemia   . Arthritis   . Hemihypertrophy   . Myocardial infarction ([place])   . Thyroid disease   . CVA (cerebral infarction) 2003  . Peripheral vascular disease ([place])     s/p stent right leg  . Pulmonary nodule     stable on Chest CT March 2014, Followed at [facility]  . Murmur, cardiac   . Chronic cystitis   . Hematuria   . Bleeding disorder ([place])   . Recurrent UTI   . Gross hematuria   . Urinary frequency   . Dysuria   . Sensory urge incontinence   . History of kidney stones     Patient Active Problem List   Diagnosis Date Noted  . Pulmonary embolism ([place]) 09/22/2014  . UTI (urinary tract infection) 09/14/2014  . Enterococcus UTI 09/14/2014  . Generalized weakness 09/14/2014  . Sepsis ([place]) 09/10/2014  . Open wound of knee, leg (except thigh), and ankle, complicated [id number]  . Hospital discharge  follow-up 07/04/2014  . Facial droop   . Malnutrition of moderate degree ([place]) 05/21/2014  . Seizures ([place])   . TIA (transient ischemic attack) 05/18/2014  . Systolic murmur [id number]  . Gait disturbance 05/15/2014  . History of subdural hematoma   . Subdural hematoma ([place]) 05/05/2014  . B12 deficiency 03/09/2014  . Recurrent UTI 01/03/2014  . Bilateral hand numbness 01/03/2014  . Nephrolithiasis 11/18/2013  . Recurrent falls 11/18/2013  . Abnormal CT scan, chest 09/01/2013  . Chest wall pain 08/31/2013  . Hematoma and contusion 08/31/2013  . Atherosclerotic peripheral vascular disease ([place]) 06/01/2013  . Bladder wall hemorrhage 12/10/2012  . Postmenopausal estrogen deficiency 11/30/2012  . Medicare annual wellness visit, subsequent 10/01/2011  . Dyslipidemia 10/01/2011  . Hypertension 10/17/2010  . Hypothyroidism 10/17/2010  . Osteoarthritis 10/17/2010    Past Surgical History  Procedure Laterality Date  . Abdominal hysterectomy  1982  . Total hip arthroplasty      right  . Spine surgery      steel rod in back  . Tonsillectomy    . Left rotator cuff      repair  . Total knee arthroplasty Left   . Hip surgery  2007    replaced ball in right hip  . Cardiac surgery      2 failed stents in right leg  . Stent placement leg Right 2015  . Carpal tunnel release    .  Appendectomy    . Ruptured disc      Current Outpatient Rx  Name  Route  Sig  Dispense  Refill  . acetaminophen (TYLENOL) 325 MG tablet   Oral   Take 325 mg by mouth every 6 (six) hours as needed for mild pain, moderate pain or headache.          Marland Kitchen acetaminophen (TYLENOL) 650 MG CR tablet   Oral   Take 650 mg by mouth 3 (three) times daily.         Marland Kitchen amLODipine (NORVASC) 5 MG tablet   Oral   Take 5 mg by mouth daily.         Marland Kitchen apixaban (ELIQUIS) 5 MG TABS tablet   Oral   Take 1 tablet (5 mg total) by mouth 2 (two) times daily.   60 tablet   0   . bifidobacterium infantis (ALIGN)  capsule   Oral   Take 1 capsule by mouth daily.         . Cranberry 425 MG CAPS   Oral   Take 425 mg by mouth daily.         . cyanocobalamin (,VITAMIN B-12,) 1000 MCG/ML injection   Intramuscular   Inject 1,000 mcg into the muscle every 30 (thirty) days. On the 2nd of each month         . ergocalciferol (VITAMIN D2) 50000 units capsule   Oral   Take 50,000 Units by mouth once a week.         Marland Kitchen guaiFENesin-dextromethorphan (ROBITUSSIN DM) 100-10 MG/5ML syrup   Oral   Take 10 mLs by mouth every 4 (four) hours as needed for cough.         . levothyroxine (SYNTHROID, LEVOTHROID) 25 MCG tablet      TAKE 1 TABLET (25 MCG TOTAL) BY MOUTH DAILY. Patient taking differently: Take 25 mcg by mouth daily before breakfast.    90 tablet   1   . loratadine (CLARITIN) 10 MG tablet   Oral   Take 1 tablet (10 mg total) by mouth daily. Patient taking differently: Take 10 mg by mouth daily as needed for allergies.          . magnesium hydroxide (MILK OF MAGNESIA) 400 MG/5ML suspension   Oral   Take 30 mLs by mouth daily as needed for mild constipation or moderate constipation.         . metoprolol succinate (TOPROL-XL) 25 MG 24 hr tablet   Oral   Take 12.5 mg by mouth daily.          . polyethylene glycol (MIRALAX / GLYCOLAX) packet   Oral   Take 17 g by mouth daily as needed for mild constipation, moderate constipation or severe constipation.         . pravastatin (PRAVACHOL) 40 MG tablet   Oral   Take 1 tablet (40 mg total) by mouth every evening. Patient taking differently: Take 40 mg by mouth at bedtime.    90 tablet   1   . senna-docusate (SENOKOT-S) 8.6-50 MG tablet   Oral   Take 1 tablet by mouth 2 (two) times daily.         . sodium chloride (OCEAN) 0.65 % SOLN nasal spray   Each Nare   Place 1 spray into both nostrils every 2 (two) hours as needed for congestion.         . traMADol (ULTRAM) 50 MG tablet   Oral   Take  25 mg by mouth every 8  (eight) hours as needed for moderate pain or severe pain.         Marland Kitchen oxybutynin (DITROPAN XL) 10 MG 24 hr tablet   Oral   Take 1 tablet (10 mg total) by mouth daily.   10 tablet   0   . phenazopyridine (PYRIDIUM) 100 MG tablet   Oral   Take 1 tablet (100 mg total) by mouth 3 (three) times daily as needed for pain.   10 tablet   0     Allergies Sulfa antibiotics; Ciprofloxacin; Levofloxacin; and Penicillins  Family History  Problem Relation Age of Onset  . Hypertension Mother   . Transient ischemic attack Mother   . Kidney disease Neg Hx   . Bladder Cancer Neg Hx   . Prostate cancer Neg Hx     Social History Social History  Substance Use Topics  . Smoking status: Never Smoker   . Smokeless tobacco: Never Used  . Alcohol Use: No    Review of Systems Constitutional: No fever/chills Eyes: No visual changes. ENT: No sore throat. Cardiovascular: Denies chest pain. Respiratory: Denies shortness of breath. Gastrointestinal:  abdominal pain.  No nausea, no vomiting.  No diarrhea.  No constipation. Genitourinary:  dysuria. Musculoskeletal: Negative for back pain. Skin: Negative for rash. Neurological: Negative for headaches, focal weakness or numbness.  10-point ROS otherwise negative.  ____________________________________________   PHYSICAL EXAM:  VITAL SIGNS: ED Triage Vitals  Enc Vitals Group     BP 02/26/15 0130 151/68 mmHg     Pulse Rate 02/26/15 0130 84     Resp 02/26/15 0130 14     Temp 02/26/15 0130 98.1 F (36.7 C)     Temp Source 02/26/15 0130 Oral     SpO2 02/26/15 0130 96 %     Weight 02/26/15 0130 127 lb (57.607 kg)     Height 02/26/15 0130 5\' 2"  (1.575 m)     Head Cir --      Peak Flow --      Pain Score 02/26/15 0133 8     Pain Loc --      Pain Edu? --      Excl. in [place]? --     Constitutional: Alert and oriented. Well appearing and in mild distress. Eyes: Conjunctivae are normal. PERRL. EOMI. Head: Atraumatic. Nose: No  congestion/rhinnorhea. Mouth/Throat: Mucous membranes are moist.  Oropharynx non-erythematous. Cardiovascular: Normal rate, regular rhythm. Systolic murmur  Good peripheral circulation. Respiratory: Normal respiratory effort.  No retractions. Lungs CTAB. Gastrointestinal: Soft with tenderness to palpation in the lower abdomen. No distention. Positive bowel sounds Musculoskeletal: No lower extremity tenderness nor edema.   Neurologic:  Normal speech and language.  Skin:  Skin is warm, dry and intact.  Psychiatric: Mood and affect are normal.   ____________________________________________   LABS (all labs ordered are listed, but only abnormal results are displayed)  Labs Reviewed  COMPREHENSIVE METABOLIC PANEL - Abnormal; Notable for the following:    Glucose, Bld 108 (*)    BUN 26 (*)    All other components within normal limits  URINALYSIS COMPLETEWITH MICROSCOPIC ([facility] ONLY) - Abnormal; Notable for the following:    Color, Urine YELLOW (*)    APPearance HAZY (*)    Hgb urine dipstick 3+ (*)    Protein, ur 100 (*)    Leukocytes, UA 2+ (*)    Squamous Epithelial / LPF 0-5 (*)    All other components within normal limits  URINE  CULTURE  CBC   ____________________________________________  EKG  none ____________________________________________  RADIOLOGY  CT abd and pelvis: No acute abnormality seen to explain the patient's symptoms, scattered bilateral renal cysts seen, 5 mm nonobstructing stone near the upper pole of the left kidney. Mild diverticulosis along the sigmoid colon ____________________________________________   PROCEDURES  Procedure(s) performed: None  Critical Care performed: No  ____________________________________________   INITIAL IMPRESSION / ASSESSMENT AND PLAN / ED COURSE  Pertinent labs & imaging results that were available during my care of the patient were reviewed by me and considered in my medical decision making (see chart for  details).  This is an 82 year old female who comes into the hospital today with a concern for urinary tract infection and lower abdominal pain. The patient's blood work as well as urinalysis is unremarkable. I will send the patient for a CT of her abdomen to evaluate for possible diverticulitis or colitis causing her pain. I will reassess the patient once I received the results.  Patient CT scan is unremarkable and she does not appear to have any infection in her urine. She does have some red blood cells but she has had it previously. I did give the patient dose of Pyridium for her dysuria as well as some ditropan and for possible bladder spasm. I feel the patient should follow back up with urology. The patient's daughter reports that she has followed up with neurology multiple times but given this is a persistent problem she should follow back up with another urologist. ____________________________________________   FINAL CLINICAL IMPRESSION(S) / ED DIAGNOSES  Final diagnoses:  Lower abdominal pain  Dysuria      Loney Hering, MD 02/26/15 0501

## 2015-02-26 NOTE — ED Notes (Signed)
Dr Webster in to see pt.  

## 2015-02-26 NOTE — Telephone Encounter (Signed)
PLEASE NOTE: All timestamps contained within this report are represented as [place] Standard Time. CONFIDENTIALTY NOTICE: This fax transmission is intended only for the addressee. It contains information that is legally privileged, confidential or otherwise protected from use or disclosure. If you are not the intended recipient, you are strictly prohibited from reviewing, disclosing, copying using or disseminating any of this information or taking any action in reliance on or regarding this information. If you have received this fax in error, please notify us immediately by telephone so that we can arrange for its return to [place]. Phone: [phone], Toll-Free: [phone], Fax: [phone] Page: 1 of 1 Call Id: [id number] [place] Patient Name: Krista Ellis Gender: Female DOB: 05-10-1930 Age: 82 Y 29 M 22 D Return Phone Number: Address: City/State/Zip: [place] Client [place] - Client Client Site [place] Physician Viviana Simpler Contact Type Call Call Type Page Only Caller Name Abigail Butts Relationship To Patient Provider Is this call to report lab results? No Return Phone Number Please choose phone number Initial Comment Abigail Butts @ [place] states the pt has a UTI really bad. Nurse Assessment Guidelines Guideline Title Affirmed Question Affirmed Notes Nurse Date/Time (Eastern Time) Disp. Time Eilene Ghazi Time) Disposition Final User 02/26/2015 12:20:13 AM Called On-Call Provider Vicente Males 02/26/2015 12:20:29 AM Page Completed Yes Vicente Males After Care Instructions Given Call Event Type User Date / Time Description Paging DoctorName Phone DateTime Result/Outcome Message Type Notes Renford Dills [id number] 02/26/2015 12:20:13 AM Called On Call Provider - Reached Doctor Paged Renford Dills 02/26/2015 12:20:19 AM Spoke with On Call - General Message Result

## 2015-02-27 ENCOUNTER — Ambulatory Visit (INDEPENDENT_AMBULATORY_CARE_PROVIDER_SITE_OTHER): Payer: Medicare Other | Admitting: Urology

## 2015-02-27 ENCOUNTER — Encounter: Payer: Self-pay | Admitting: Urology

## 2015-02-27 VITALS — BP 147/61 | HR 80 | Ht 63.0 in | Wt 114.0 lb

## 2015-02-27 DIAGNOSIS — N3289 Other specified disorders of bladder: Secondary | ICD-10-CM

## 2015-02-27 DIAGNOSIS — N952 Postmenopausal atrophic vaginitis: Secondary | ICD-10-CM

## 2015-02-27 DIAGNOSIS — R300 Dysuria: Secondary | ICD-10-CM

## 2015-02-27 DIAGNOSIS — R3129 Other microscopic hematuria: Secondary | ICD-10-CM

## 2015-02-27 LAB — BLADDER SCAN AMB NON-IMAGING: Scan Result: 63

## 2015-02-27 LAB — URINE CULTURE: CULTURE: NO GROWTH

## 2015-02-27 MED ORDER — ESTRADIOL 0.1 MG/GM VA CREA: TOPICAL_CREAM | VAGINAL | Status: DC

## 2015-02-27 NOTE — Progress Notes (Signed)
02/27/2015 11:18 AM   Krista Ellis 1930-02-12 [id number]  Referring provider: Venia Carbon, MD [place], [place] [address]  Chief Complaint  Patient presents with  . Dysuria    ER referral    HPI: Patient is an 82 -year-old Caucasian female who was found to have microscopic hematuria associated with dysuria and suprapubic pain with a negative urine culture by [facility] emergency room.  Patient presented to the emergency room on 02/26/2015 complaining of dysuria and pain during urination.  He was not having gross hematuria at that time. She also denied fevers, chills, nausea or vomiting. Her urinalysis demonstrated too numerous to count RBC's per high-power field. The urine culture was negative.    Patient had a CT renal stone study on 12/28/2014 which noted a 4 mm nonobstructing left renal calculus. No evidence of ureteral calculi, hydronephrosis or other acute findings.  She then had a CT scan of the abdomen and pelvis with contrast on 02/26/2015 which again identified the nonobstructing stone in the left kidney and bilateral renal cysts.    She does not endorse gross hematuria. She does complain of frequent urination, dysuria, urgency, nocturia, incontinence, intermittency and urinary tract infections. She also complains of bladder spasms. She is not experiencing fevers, chills, nausea or vomiting.   PMH: Past Medical History  Diagnosis Date  . Hypertension   . Hyperlipidemia   . Arthritis   . Hemihypertrophy   . Myocardial infarction ([place])   . Thyroid disease   . CVA (cerebral infarction) 2003  . Peripheral vascular disease ([place])     s/p stent right leg  . Pulmonary nodule     stable on Chest CT March 2014, Followed at [facility]  . Murmur, cardiac   . Chronic cystitis   . Hematuria   . Bleeding disorder ([place])   . Recurrent UTI   . Gross hematuria   . Urinary frequency   . Dysuria   . Sensory urge incontinence   .  History of kidney stones     Surgical History: Past Surgical History  Procedure Laterality Date  . Abdominal hysterectomy  1982  . Total hip arthroplasty      right  . Spine surgery      steel rod in back  . Tonsillectomy    . Left rotator cuff      repair  . Total knee arthroplasty Left   . Hip surgery  2007    replaced ball in right hip  . Cardiac surgery      2 failed stents in right leg  . Stent placement leg Right 2015  . Carpal tunnel release    . Appendectomy    . Ruptured disc      Home Medications:    Medication List       This list is accurate as of: 02/27/15 11:18 AM.  Always use your most recent med list.               acetaminophen 325 MG tablet  Commonly known as:  TYLENOL  Take 325 mg by mouth every 6 (six) hours as needed for mild pain, moderate pain or headache. Reported on 02/27/2015     acetaminophen 650 MG CR tablet  Commonly known as:  TYLENOL  Take 650 mg by mouth 3 (three) times daily.     amLODipine 5 MG tablet  Commonly known as:  NORVASC  Take 5 mg by mouth daily.  apixaban 5 MG Tabs tablet  Commonly known as:  ELIQUIS  Take 1 tablet (5 mg total) by mouth 2 (two) times daily.     bifidobacterium infantis capsule  Take 1 capsule by mouth daily.     cefUROXime 250 MG tablet  Commonly known as:  CEFTIN  Reported on 02/27/2015     cephALEXin 250 MG capsule  Commonly known as:  KEFLEX  Reported on 02/27/2015     Cranberry 425 MG Caps  Take 425 mg by mouth daily.     cyanocobalamin 1000 MCG/ML injection  Commonly known as:  (VITAMIN B-12)  Inject 1,000 mcg into the muscle every 30 (thirty) days. On the 2nd of each month     ergocalciferol 50000 units capsule  Commonly known as:  VITAMIN D2  Take 50,000 Units by mouth once a week.     estradiol 0.1 MG/GM vaginal cream  Commonly known as:  ESTRACE VAGINAL  Apply 0.5mg  (pea-sized amount)  just inside the vaginal introitus with a finger-tip every night for two weeks and then  Monday, Wednesday and Friday nights.     guaiFENesin-dextromethorphan 100-10 MG/5ML syrup  Commonly known as:  ROBITUSSIN DM  Take 10 mLs by mouth every 4 (four) hours as needed for cough. Reported on 02/27/2015     levothyroxine 25 MCG tablet  Commonly known as:  SYNTHROID, LEVOTHROID  TAKE 1 TABLET (25 MCG TOTAL) BY MOUTH DAILY.     loratadine 10 MG tablet  Commonly known as:  CLARITIN  Take 1 tablet (10 mg total) by mouth daily.     magnesium hydroxide 400 MG/5ML suspension  Commonly known as:  MILK OF MAGNESIA  Take 30 mLs by mouth daily as needed for mild constipation or moderate constipation.     metoprolol succinate 25 MG 24 hr tablet  Commonly known as:  TOPROL-XL  Take 12.5 mg by mouth daily.     oxybutynin 5 MG tablet  Commonly known as:  DITROPAN  Take 0.5 tablets (2.5 mg total) by mouth 2 (two) times daily.     phenazopyridine 100 MG tablet  Commonly known as:  PYRIDIUM  Take 1 tablet (100 mg total) by mouth 3 (three) times daily as needed for pain.     polyethylene glycol packet  Commonly known as:  MIRALAX / GLYCOLAX  Take 17 g by mouth daily as needed for mild constipation, moderate constipation or severe constipation. Reported on 02/27/2015     pravastatin 40 MG tablet  Commonly known as:  PRAVACHOL  Take 1 tablet (40 mg total) by mouth every evening.     senna-docusate 8.6-50 MG tablet  Commonly known as:  Senokot-S  Take 1 tablet by mouth 2 (two) times daily.     sodium chloride 0.65 % Soln nasal spray  Commonly known as:  OCEAN  Place 1 spray into both nostrils every 2 (two) hours as needed for congestion.     traMADol 50 MG tablet  Commonly known as:  ULTRAM  Take 25 mg by mouth every 8 (eight) hours as needed for moderate pain or severe pain.        Allergies:  Allergies  Allergen Reactions  . Sulfa Antibiotics Hives and Itching  . Ciprofloxacin Hives and Itching  . Levofloxacin Hives and Itching  . Penicillins Hives and Itching    Family  History: Family History  Problem Relation Age of Onset  . Hypertension Mother   . Transient ischemic attack Mother   . Kidney disease Neg  Hx   . Bladder Cancer Neg Hx   . Prostate cancer Neg Hx     Social History:  reports that she has never smoked. She has never used smokeless tobacco. She reports that she does not drink alcohol or use illicit drugs.  ROS: UROLOGY Frequent Urination?: Yes Hard to postpone urination?: Yes Burning/pain with urination?: Yes Get up at night to urinate?: Yes Leakage of urine?: Yes Urine stream starts and stops?: Yes Trouble starting stream?: No Do you have to strain to urinate?: Yes Blood in urine?: No Urinary tract infection?: Yes Sexually transmitted disease?: No Injury to kidneys or bladder?: No Painful intercourse?: No Weak stream?: No Currently pregnant?: No Vaginal bleeding?: No Last menstrual period?: n  Gastrointestinal Nausea?: No Vomiting?: No Indigestion/heartburn?: No Diarrhea?: No Constipation?: No  Constitutional Fever: No Night sweats?: No Weight loss?: Yes Fatigue?: No  Skin Skin rash/lesions?: Yes Itching?: Yes  Eyes Blurred vision?: No Double vision?: No  Ears/Nose/Throat Sore throat?: No Sinus problems?: No  Hematologic/Lymphatic Swollen glands?: No Easy bruising?: Yes  Cardiovascular Leg swelling?: No Chest pain?: No  Respiratory Cough?: No Shortness of breath?: No  Endocrine Excessive thirst?: No  Musculoskeletal Back pain?: No Joint pain?: Yes  Neurological Headaches?: No Dizziness?: No  Psychologic Depression?: No Anxiety?: No  Physical Exam: BP 147/61 mmHg  Pulse 80  Ht 5\' 3"  (1.6 m)  Wt 114 lb (51.71 kg)  BMI 20.20 kg/m2  Constitutional: Well nourished. Alert and oriented, No acute distress. HEENT: [place] AT, moist mucus membranes. Trachea midline, no masses. Cardiovascular: No clubbing, cyanosis, or edema. Respiratory: Normal respiratory effort, no increased work of  breathing. GI: Abdomen is soft, non tender, non distended, no abdominal masses. Liver and spleen not palpable.  No hernias appreciated.  Stool sample for occult testing is not indicated.   GU: No CVA tenderness.  No bladder fullness or masses.  Atrophic external genitalia, normal pubic hair distribution, no lesions.  Normal urethral meatus, no lesions, no prolapse, no discharge.   No urethral masses, tenderness and/or tenderness. No bladder fullness, tenderness or masses. Atrophic vagina mucosa, poor estrogen effect, no discharge, no lesions, good pelvic support, no cystocele or rectocele noted.  No cervical motion tenderness.  Cervix and uterus are surgically absent.  No pelvic masses are palpated.  Anus and perineum are without rashes or lesions.    Skin: No rashes, bruises or suspicious lesions. Lymph: No cervical or inguinal adenopathy.  Neurologic: Grossly intact, no focal deficits, moving all 4 extremities. Psychiatric: Normal mood and affect.  Laboratory Data: Lab Results  Component Value Date   WBC 6.3 02/26/2015   HGB 14.0 02/26/2015   HCT 42.2 02/26/2015   MCV 94.9 02/26/2015   PLT 203 02/26/2015   Lab Results  Component Value Date   CREATININE 0.68 02/26/2015   Lab Results  Component Value Date   HGBA1C 5.5 05/19/2014   Lab Results  Component Value Date   TSH 3.99 08/11/2014   Lab Results  Component Value Date   AST 24 02/26/2015   Lab Results  Component Value Date   ALT 16 02/26/2015   Pertinent Imaging: CLINICAL DATA: Left lateral abdominal pain. Urinary tract infection. Nephrolithiasis.  EXAM: CT ABDOMEN AND PELVIS WITHOUT CONTRAST  TECHNIQUE: Multidetector CT imaging of the abdomen and pelvis was performed following the standard protocol without IV contrast.  COMPARISON: 09/10/2014  FINDINGS: Lower chest: No acute findings.  Hepatobiliary: Probable tiny calcified gallstone noted, however there is no evidence of cholecystitis or biliary  ductal dilatation. No liver mass visualized on this unenhanced exam.  Pancreas: No mass or inflammatory process identified on this un-enhanced exam.  Spleen: Within normal limits in size.  Adrenals/Urinary Tract: Unremarkable adrenal glands. 4 mm calculus again seen in lower pole of the left kidney. No evidence hydronephrosis. No evidence of ureteral calculi or dilatation. Gas is noted within urinary bladder, likely due to recent catheterization.  Stomach/Bowel: No evidence of obstruction, inflammatory process, or abnormal fluid collections. Sigmoid diverticulosis is again demonstrated, without evidence of diverticulitis. Moderate colonic stool noted.  Vascular/Lymphatic: No pathologically enlarged lymph nodes. No evidence of abdominal aortic aneurysm.  Reproductive: No mass or other significant abnormality.  Other: Artifact from right hip prosthesis obscures inferior portion of pelvis.  Musculoskeletal: No suspicious bone lesions identified. Severe lumbar spondylosis seen with fusion hardware at L4-5.  IMPRESSION: 4 mm nonobstructive left renal calculus. No evidence of ureteral calculi, hydronephrosis, or other acute findings.  Gas within urinary bladder, likely due to recent instrumentation ; suggest clinical correlation.  Colonic diverticulosis. No radiographic evidence of diverticulitis.  Cholelithiasis. No radiographic evidence of cholecystitis.   Electronically Signed  By: Earle Gell M.D.  On: 12/28/2014 21:15  CLINICAL DATA: Acute onset of lower abdominal pain, dysuria and diarrhea. Initial encounter.  EXAM: CT ABDOMEN AND PELVIS WITH CONTRAST  TECHNIQUE: Multidetector CT imaging of the abdomen and pelvis was performed using the standard protocol following bolus administration of intravenous contrast.  CONTRAST: 175mL OMNIPAQUE IOHEXOL 300 MG/ML SOLN  COMPARISON: CT of the abdomen and pelvis from 12/28/2014  FINDINGS: The  visualized lung bases are clear. Scattered coronary artery calcification is noted.  The liver and spleen are unremarkable in appearance. The gallbladder is within normal limits. The common bile duct measures 8 mm, within normal limits for the patient's age. The pancreas and adrenal glands are unremarkable.  Scattered bilateral renal cysts are noted. A 5 mm stone is noted near the upper pole of the left kidney. The kidneys are otherwise unremarkable. There is no evidence of hydronephrosis. No obstructing ureteral stones are seen. No perinephric stranding is appreciated.  No free fluid is identified. The small bowel is unremarkable in appearance. The stomach is within normal limits. No acute vascular abnormalities are seen. Scattered calcification is noted along the abdominal aorta and its branches, including along the superior mesenteric artery.  The patient is status post appendectomy. Mild diverticulosis is noted along the sigmoid colon. The colon is otherwise unremarkable.  The bladder is mildly distended and grossly unremarkable. The patient is status post hysterectomy. No suspicious adnexal masses are seen. No inguinal lymphadenopathy is seen.  No acute osseous abnormalities are identified. The patient's right hip hemiarthroplasty is grossly unremarkable in appearance. The patient is status post lumbar spinal fusion at L4-L5. There is multilevel vacuum phenomenon along the lumbar spine, with grade 1 anterolisthesis of L3 on L4 and L4 on L5, and grade 1 retrolisthesis of T12 on L1, L1 on L2, and L2 on L3.  IMPRESSION: 1. No acute abnormality seen to explain the patient's symptoms. 2. Scattered bilateral renal cysts seen. 5 mm nonobstructing stone near the upper pole of the left kidney. 3. Scattered coronary artery calcifications seen. 4. Scattered calcification along the abdominal aorta and its branches, including along the superior mesenteric artery. 5. Mild  diverticulosis along the sigmoid colon. 6. Mild diffuse degenerative change along the lumbar spine.   Electronically Signed  By: Garald Balding M.D.  On: 02/26/2015 04:30 Results for MALAI, MURRAH (MRN [id number]) as of  03/01/2015 15:25  Ref. Range 02/27/2015 10:55  Scan Result Unknown 63    Assessment & Plan:    1. Microscopic hematuria:   While hematuria workup usually includes a CT urogram, patient has had a CT scan of the abdomen and pelvis without contrast and one CT scan of the abdomen and pelvis with contrast within the last year.   These scans have demonstrated renal cysts and nonobstructing nephrolithiasis.   She has not, however, undergone a cystoscopic examination.   I will schedule this exam for her.   I have explained to the patient that they will  be scheduled for a cystoscopy in our office to evaluate their bladder.  The cystoscopy consists of passing a tube with a lens up through their urethra and into their urinary bladder.   We will inject the urethra with a lidocaine gel prior to introducing the cystoscope to help with any discomfort during the procedure.   After the procedure, they might experience blood in the urine and discomfort with urination.  This will abate after the first few voids.  I have  encouraged the patient to increase water intake  during this time.  Patient denies any allergies to lidocaine.   - Urinalysis, Complete  2. Dysuria:   Patient's urine culture was negative. She will be undergoing a cystoscopic examination for the microscopic hematuria. Hopefully, this would also rule out a more ominous cause for her dysuria such as carcinoma in situ of the bladder.  3. Bladder spasms:   Patient is experiencing bladder spasms with a negative urine culture. Her PVR was 63 mL, so her bladder spasms are not caused by urinary retention.  She will be undergoing cystoscopic examination in the future.    - BLADDER SCAN AMB NON-IMAGING  4. Atrophic vaginitis:    Patient was given a sample of vaginal estrogen cream and instructed to apply 0.5mg  (pea-sized amount)  just inside the vaginal introitus with a finger-tip every night for two weeks and then Monday, Wednesday and Friday nights.  I explained to the patient that vaginally administered estrogen, which causes only a slight increase in the blood estrogen levels, have fewer contraindications and adverse systemic effects that oral HT.   Return for schedule cystoscopy for microscopic hematuria.  These notes generated with voice recognition software. I apologize for typographical errors.  Zara Council, [facility] [address] [place], [place] [address] [phone]

## 2015-03-01 DIAGNOSIS — N3289 Other specified disorders of bladder: Secondary | ICD-10-CM | POA: Insufficient documentation

## 2015-03-01 DIAGNOSIS — R3129 Other microscopic hematuria: Secondary | ICD-10-CM | POA: Insufficient documentation

## 2015-03-01 DIAGNOSIS — R300 Dysuria: Secondary | ICD-10-CM | POA: Insufficient documentation

## 2015-03-01 DIAGNOSIS — N952 Postmenopausal atrophic vaginitis: Secondary | ICD-10-CM | POA: Insufficient documentation

## 2015-03-15 DIAGNOSIS — I2601 Septic pulmonary embolism with acute cor pulmonale: Secondary | ICD-10-CM | POA: Diagnosis not present

## 2015-03-15 DIAGNOSIS — I10 Essential (primary) hypertension: Secondary | ICD-10-CM | POA: Diagnosis not present

## 2015-03-15 DIAGNOSIS — I6523 Occlusion and stenosis of bilateral carotid arteries: Secondary | ICD-10-CM | POA: Diagnosis not present

## 2015-03-15 DIAGNOSIS — B079 Viral wart, unspecified: Secondary | ICD-10-CM | POA: Diagnosis not present

## 2015-03-15 DIAGNOSIS — I739 Peripheral vascular disease, unspecified: Secondary | ICD-10-CM | POA: Diagnosis not present

## 2015-03-15 DIAGNOSIS — G9009 Other idiopathic peripheral autonomic neuropathy: Secondary | ICD-10-CM

## 2015-03-16 ENCOUNTER — Ambulatory Visit (INDEPENDENT_AMBULATORY_CARE_PROVIDER_SITE_OTHER): Payer: Medicare Other | Admitting: Urology

## 2015-03-16 ENCOUNTER — Encounter: Payer: Self-pay | Admitting: Urology

## 2015-03-16 VITALS — BP 110/74 | HR 64 | Ht 62.0 in | Wt 115.3 lb

## 2015-03-16 DIAGNOSIS — R3129 Other microscopic hematuria: Secondary | ICD-10-CM

## 2015-03-16 LAB — URINALYSIS, COMPLETE
BILIRUBIN UA: NEGATIVE
KETONES UA: NEGATIVE
Nitrite, UA: POSITIVE — AB
SPEC GRAV UA: 1.02 (ref 1.005–1.030)
UUROB: 1 mg/dL (ref 0.2–1.0)
pH, UA: 5 (ref 5.0–7.5)

## 2015-03-16 LAB — MICROSCOPIC EXAMINATION
RBC, UA: >30 /hpf — AB (ref 0–?)
WBC, UA: >30 /hpf — AB (ref 0–?)

## 2015-03-18 LAB — CULTURE, URINE COMPREHENSIVE

## 2015-03-18 NOTE — Progress Notes (Signed)
Patient presented today to the office for cystoscopy for further evaluation of microscopic hematuria, recurrent urinary tract infection. Unfortunately, today, her urine is highly suspicious for colonization/infection. She is relatively asymptomatic.  Discussed that in the setting of cystitis, high risk for nonspecific/suspicious findings in the bladder. Prefer to treat infection and return at a later date. Patient is agreeable with this plan. We'll prescribe antibiotics based on urine culture data.  Cystoscopy to be rescheduled.  Results for orders placed or performed in visit on 03/16/15  CULTURE, URINE COMPREHENSIVE  Result Value Ref Range   Urine Culture, Comprehensive Final report (A)    Result 1 Escherichia coli (A)    ANTIMICROBIAL SUSCEPTIBILITY Comment   Microscopic Examination  Result Value Ref Range   WBC, UA >30 (A) 0 -  5 /hpf   RBC, UA >30 (A) 0 -  2 /hpf   Epithelial Cells (non renal) 0-10 0 - 10 /hpf   Bacteria, UA Moderate (A) None seen/Few  Urinalysis, Complete  Result Value Ref Range   Specific Gravity, UA 1.020 1.005 - 1.030   pH, UA 5.0 5.0 - 7.5   Color, UA Orange Yellow   Appearance Ur Cloudy (A) Clear   Leukocytes, UA 3+ (A) Negative   Protein, UA 2+ (A) Negative/Trace   Glucose, UA Trace (A) Negative   Ketones, UA Negative Negative   RBC, UA 3+ (A) Negative   Bilirubin, UA Negative Negative   Urobilinogen, Ur 1.0 0.2 - 1.0 mg/dL   Nitrite, UA Positive (A) Negative   Microscopic Examination See below:

## 2015-03-20 ENCOUNTER — Telehealth: Payer: Self-pay

## 2015-03-20 NOTE — Telephone Encounter (Signed)
-----   Message from Hollice Espy, MD sent at 03/18/2015  3:10 PM EST ----- Please call and treat with Augmentin bid x 3 days than make sure cysto is rescheduled.   It may be a good idea for her to come in and get her urine rechecked in a week before her next scheduled cystoscopy to prevent [place] from canceling again.  Hollice Espy, MD

## 2015-03-20 NOTE — Telephone Encounter (Signed)
Spoke with Ronald Pippins, pt daughter, in reference to abx. Ronald Pippins stated that she needs to go to facility and see what their process is prior to [facility] sending abx over. Nurse requested Ronald Pippins call back today. Ronald Pippins voiced understanding.

## 2015-03-21 ENCOUNTER — Telehealth: Payer: Self-pay

## 2015-03-21 ENCOUNTER — Telehealth: Payer: Self-pay | Admitting: Urology

## 2015-03-21 NOTE — Telephone Encounter (Signed)
OK to use this medications.  She has tolerated Keflex and other PCN derivatives before.    Hollice Espy, MD

## 2015-03-21 NOTE — Telephone Encounter (Signed)
Krista Ellis from [facility] called in reference to previous message. She states that they will preform a UA at the facility Friday 1 week prior to cysto, they will fax results to our office for review and we will let them know if UA should be sent for culture. Krista Ellis was given verbal orders to treat with Augmentin bid x 3 days per Dr. Cherrie Gauze note. Orders were read back for confirmation. Fax number was given for UA results to be sent.   Status: Signed       Expand All Collapse All   Spoke with Ronald Pippins, pt daughter, in reference to abx. Ronald Pippins stated that she needs to go to facility and see what their process is prior to [facility] sending abx over. Nurse requested Ronald Pippins call back today. Ronald Pippins voiced understanding.             Lestine Box, LPN at [phone] [id number] PM     Status: Signed       Expand All Collapse All   ----- Message from Hollice Espy, MD sent at 03/18/2015 3:10 PM EST ----- Please call and treat with Augmentin bid x 3 days than make sure cysto is rescheduled. It may be a good idea for her to come in and get her urine rechecked in a week before her next scheduled cystoscopy to prevent [place] from canceling again.  Hollice Espy, MD

## 2015-03-21 NOTE — Telephone Encounter (Signed)
Nurse Malachy Mood [phone]) from [facility] called today and spoke to Fonnie Jarvis.  Sarah provided instructions for this patient with a prescription for antibiotic.    Malachy Mood called back to advise that the patient has an allergy to penicillin.  She just wanted to make sure that we are aware of the allergy and needs permission to proceed.

## 2015-03-21 NOTE — Telephone Encounter (Signed)
Spoke with Malachy Mood at [facility] and gave her the message per Dr. Erlene Quan ok to take the Augmentin

## 2015-03-29 DIAGNOSIS — R300 Dysuria: Secondary | ICD-10-CM | POA: Diagnosis not present

## 2015-04-02 ENCOUNTER — Other Ambulatory Visit: Payer: Self-pay

## 2015-04-06 ENCOUNTER — Ambulatory Visit (INDEPENDENT_AMBULATORY_CARE_PROVIDER_SITE_OTHER): Payer: Medicare Other | Admitting: Urology

## 2015-04-06 VITALS — BP 157/67 | HR 64 | Temp 97.6°F | Resp 16

## 2015-04-06 DIAGNOSIS — R3129 Other microscopic hematuria: Secondary | ICD-10-CM | POA: Diagnosis not present

## 2015-04-06 DIAGNOSIS — N390 Urinary tract infection, site not specified: Secondary | ICD-10-CM | POA: Diagnosis not present

## 2015-04-06 NOTE — Progress Notes (Signed)
04/06/2015 2:50 PM   Krista Ellis [birth year] [id number]  Referring provider: Venia Carbon, MD [name], [name] [address]  Chief Complaint  Patient presents with  . Cysto    HPI: The patient is receiving treated again with a urinary tract infection is finishing a course of antibiotics. I discussed with the family that I was concerned that if I were to look inside her bladder this time, there is a good chance of false positive with erythema from her current UTI. Am afraid that may lead to numbness sutured to the operating room for bladder biopsy. Discussed the patient will finish her course of antibiotics and wait until next week and we'll do a cystoscopy at that point. I did give her samples of your bowel instructed her take up to 4 pills daily for dysuria. She was also informed to drink a glass of water pill. She was also warned that her urine will turn blue.   PMH: Past Medical History  Diagnosis Date  . Hypertension   . Hyperlipidemia   . Arthritis   . Hemihypertrophy   . Myocardial infarction ([place])   . Thyroid disease   . CVA (cerebral infarction) 2003  . Peripheral vascular disease ([place])     s/p stent right leg  . Pulmonary nodule     stable on Chest CT March 2014, Followed at [facility]  . Murmur, cardiac   . Chronic cystitis   . Hematuria   . Bleeding disorder ([place])   . Recurrent UTI   . Gross hematuria   . Urinary frequency   . Dysuria   . Sensory urge incontinence   . History of kidney stones     Surgical History: Past Surgical History  Procedure Laterality Date  . Abdominal hysterectomy  1982  . Total hip arthroplasty      right  . Spine surgery      steel rod in back  . Tonsillectomy    . Left rotator cuff      repair  . Total knee arthroplasty Left   . Hip surgery  2007    replaced ball in right hip  . Cardiac surgery      2 failed stents in right leg  . Stent placement leg Right 2015  . Carpal tunnel release    .  Appendectomy    . Ruptured disc      Home Medications:    Medication List       This list is accurate as of: 04/06/15  2:50 PM.  Always use your most recent med list.               acetaminophen 325 MG tablet  Commonly known as:  TYLENOL  Take 325 mg by mouth every 6 (six) hours as needed for mild pain, moderate pain or headache. Reported on 02/27/2015     acetaminophen 650 MG CR tablet  Commonly known as:  TYLENOL  Take 650 mg by mouth 3 (three) times daily. Reported on 04/06/2015     amLODipine 5 MG tablet  Commonly known as:  NORVASC  Take 5 mg by mouth daily.     apixaban 5 MG Tabs tablet  Commonly known as:  ELIQUIS  Take 1 tablet (5 mg total) by mouth 2 (two) times daily.     bifidobacterium infantis capsule  Take 1 capsule by mouth daily.     cefUROXime 250 MG tablet  Commonly known as:  CEFTIN  clopidogrel 75 MG tablet  Commonly known as:  PLAVIX     Cranberry 425 MG Caps  Take 425 mg by mouth daily.     cyanocobalamin 1000 MCG/ML injection  Commonly known as:  (VITAMIN B-12)  Inject 1,000 mcg into the muscle every 30 (thirty) days. On the 2nd of each month     ergocalciferol 50000 units capsule  Commonly known as:  VITAMIN D2  Take 50,000 Units by mouth once a week.     estradiol 0.1 MG/GM vaginal cream  Commonly known as:  ESTRACE VAGINAL  Apply 0.5mg  (pea-sized amount)  just inside the vaginal introitus with a finger-tip every night for two weeks and then Monday, Wednesday and Friday nights.     levothyroxine 25 MCG tablet  Commonly known as:  SYNTHROID, LEVOTHROID  TAKE 1 TABLET (25 MCG TOTAL) BY MOUTH DAILY.     loratadine 10 MG tablet  Commonly known as:  CLARITIN  Take 1 tablet (10 mg total) by mouth daily.     magnesium hydroxide 400 MG/5ML suspension  Commonly known as:  MILK OF MAGNESIA  Take 30 mLs by mouth daily as needed for mild constipation or moderate constipation.     metoprolol succinate 25 MG 24 hr tablet  Commonly known  as:  TOPROL-XL  Take 12.5 mg by mouth daily.     oxybutynin 5 MG tablet  Commonly known as:  DITROPAN  Take 0.5 tablets (2.5 mg total) by mouth 2 (two) times daily.     phenazopyridine 100 MG tablet  Commonly known as:  PYRIDIUM  Take 1 tablet (100 mg total) by mouth 3 (three) times daily as needed for pain.     polyethylene glycol packet  Commonly known as:  MIRALAX / GLYCOLAX  Take 17 g by mouth daily as needed for mild constipation, moderate constipation or severe constipation. Reported on 02/27/2015     pravastatin 40 MG tablet  Commonly known as:  PRAVACHOL  Take 1 tablet (40 mg total) by mouth every evening.     senna-docusate 8.6-50 MG tablet  Commonly known as:  Senokot-S  Take 1 tablet by mouth 2 (two) times daily.     sodium chloride 0.65 % Soln nasal spray  Commonly known as:  OCEAN  Place 1 spray into both nostrils every 2 (two) hours as needed for congestion.     traMADol 50 MG tablet  Commonly known as:  ULTRAM  Take 25 mg by mouth every 8 (eight) hours as needed for moderate pain or severe pain.        Allergies:  Allergies  Allergen Reactions  . Sulfa Antibiotics Hives and Itching  . Ciprofloxacin Hives and Itching  . Levofloxacin Hives and Itching  . Penicillins Hives and Itching    Family History: Family History  Problem Relation Age of Onset  . Hypertension Mother   . Transient ischemic attack Mother   . Kidney disease Neg Hx   . Bladder Cancer Neg Hx   . Prostate cancer Neg Hx     Social History:  reports that she has never smoked. She has never used smokeless tobacco. She reports that she does not drink alcohol or use illicit drugs.  ROS:                                        Physical Exam: BP 157/67 mmHg  Pulse 64  Temp(Src)  97.6 F (36.4 C) (Oral)  Resp 16  SpO2 96%  Constitutional:  Alert and oriented, No acute distress. HEENT: [place] AT, moist mucus membranes.  Trachea midline, no masses. Cardiovascular: No  clubbing, cyanosis, or edema. Respiratory: Normal respiratory effort, no increased work of breathing. GI: Abdomen is soft, nontender, nondistended, no abdominal masses GU: No CVA tenderness. Skin: No rashes, bruises or suspicious lesions. Lymph: No cervical or inguinal adenopathy. Neurologic: Grossly intact, no focal deficits, moving all 4 extremities. Psychiatric: Normal mood and affect.  Laboratory Data: Lab Results  Component Value Date   WBC 6.3 02/26/2015   HGB 14.0 02/26/2015   HCT 42.2 02/26/2015   MCV 94.9 02/26/2015   PLT 203 02/26/2015    Lab Results  Component Value Date   CREATININE 0.68 02/26/2015    No results found for: PSA  No results found for: TESTOSTERONE  Lab Results  Component Value Date   HGBA1C 5.5 05/19/2014    Urinalysis    Component Value Date/Time   COLORURINE YELLOW* 02/26/2015 0137   COLORURINE Yellow 04/18/2014 1455   APPEARANCEUR Cloudy* 03/16/2015 1352   APPEARANCEUR HAZY* 02/26/2015 0137   APPEARANCEUR Cloudy 04/18/2014 1455   LABSPEC 1.010 02/26/2015 0137   LABSPEC 1.019 04/18/2014 1455   PHURINE 7.0 02/26/2015 0137   PHURINE 5.0 04/18/2014 1455   GLUCOSEU Trace* 03/16/2015 1352   GLUCOSEU Negative 04/18/2014 1455   HGBUR 3+* 02/26/2015 0137   HGBUR 3+ 04/18/2014 1455   BILIRUBINUR Negative 03/16/2015 1352   BILIRUBINUR NEGATIVE 02/26/2015 0137   BILIRUBINUR Negative 04/18/2014 1455   KETONESUR NEGATIVE 02/26/2015 0137   KETONESUR Negative 04/18/2014 1455   PROTEINUR 2+* 03/16/2015 1352   PROTEINUR 100* 02/26/2015 0137   PROTEINUR 30 mg/dL 04/18/2014 1455   UROBILINOGEN 1.0 05/20/2014 0225   NITRITE Positive* 03/16/2015 1352   NITRITE NEGATIVE 02/26/2015 0137   NITRITE Negative 04/18/2014 1455   LEUKOCYTESUR 3+* 03/16/2015 1352   LEUKOCYTESUR 2+* 02/26/2015 0137   LEUKOCYTESUR 2+ 04/18/2014 1455   Assessment & Plan:    1. Microscopic hematuria/Recurrent UTI Follow-up next week for cystoscopy   No Follow-up on  file.  Nickie Retort, MD  [facility] [address], [place], [place] [address] [phone]

## 2015-04-09 DIAGNOSIS — R300 Dysuria: Secondary | ICD-10-CM | POA: Diagnosis not present

## 2015-04-10 ENCOUNTER — Other Ambulatory Visit: Payer: Self-pay | Admitting: Urology

## 2015-04-10 ENCOUNTER — Telehealth: Payer: Self-pay

## 2015-04-10 NOTE — Telephone Encounter (Signed)
Malachy Mood, nurse from [facility], called in reference to u/a faxed over for pt. Per Dr. Pilar Jarvis pt should start macrobid 100mg  po bid x7 days. Verbal orders were given to [facility]. Malachy Mood voiced understanding.  Per Dr. Pilar Jarvis a u/a does NOT need to be checked at cysto appt on Friday.

## 2015-04-11 ENCOUNTER — Other Ambulatory Visit: Payer: Self-pay

## 2015-04-11 ENCOUNTER — Other Ambulatory Visit: Payer: Self-pay | Admitting: Urology

## 2015-04-12 DIAGNOSIS — R509 Fever, unspecified: Secondary | ICD-10-CM | POA: Diagnosis not present

## 2015-04-12 DIAGNOSIS — R4182 Altered mental status, unspecified: Secondary | ICD-10-CM | POA: Diagnosis not present

## 2015-04-13 ENCOUNTER — Telehealth: Payer: Self-pay

## 2015-04-13 ENCOUNTER — Ambulatory Visit (INDEPENDENT_AMBULATORY_CARE_PROVIDER_SITE_OTHER): Payer: Medicare Other | Admitting: Urology

## 2015-04-13 ENCOUNTER — Encounter: Payer: Self-pay | Admitting: Urology

## 2015-04-13 VITALS — BP 119/63 | HR 64 | Ht 63.0 in | Wt 121.0 lb

## 2015-04-13 DIAGNOSIS — N200 Calculus of kidney: Secondary | ICD-10-CM | POA: Diagnosis not present

## 2015-04-13 DIAGNOSIS — R300 Dysuria: Secondary | ICD-10-CM

## 2015-04-13 DIAGNOSIS — N390 Urinary tract infection, site not specified: Secondary | ICD-10-CM | POA: Diagnosis not present

## 2015-04-13 DIAGNOSIS — E782 Mixed hyperlipidemia: Secondary | ICD-10-CM | POA: Insufficient documentation

## 2015-04-13 DIAGNOSIS — R3129 Other microscopic hematuria: Secondary | ICD-10-CM | POA: Diagnosis not present

## 2015-04-13 DIAGNOSIS — K219 Gastro-esophageal reflux disease without esophagitis: Secondary | ICD-10-CM | POA: Insufficient documentation

## 2015-04-13 NOTE — Telephone Encounter (Signed)
I received a phone call from Lillia Mountain, RN from [facility] requesting some clarity on instructing for [facility]. Pt states Dr. Pilar Jarvis informed her to take it twice daily instead of 4 times daily so samples could last longer. She also wanted to know if it was okay for them to use the applicator to insert the estrace cream, because the last time they rubbed it on the inside of the labia the pt complained of severe burning. Dr. Pilar Jarvis verbally stated it was okay to use a pea size 1/2 gram in the vagina and to take it twice daily.

## 2015-04-13 NOTE — Progress Notes (Signed)
04/13/2015 3:04 PM   Krista Ellis 1930/05/03 [id number]  Referring provider: Venia Carbon, MD [place], [place] [address]  Chief Complaint  Patient presents with  . Cysto    HPI: Patient is an 82 -year-old Caucasian female who was found to have microscopic hematuria associated with dysuria and suprapubic pain with a negative urine culture by [facility] emergency room.  Patient presented to the emergency room on 02/26/2015 complaining of dysuria and pain during urination. He was not having gross hematuria at that time. She also denied fevers, chills, nausea or vomiting. Her urinalysis demonstrated too numerous to count RBC's per high-power field. The urine culture was negative.   Patient had a CT renal stone study on 12/28/2014 which noted a 4 mm nonobstructing left renal calculus. No evidence of ureteral calculi, hydronephrosis or other acute findings. She then had a CT scan of the abdomen and pelvis with contrast on 02/26/2015 which again identified the nonobstructing stone in the left kidney and bilateral renal cysts.   She does not endorse gross hematuria. She does complain of frequent urination, dysuria, urgency, nocturia, incontinence, intermittency and urinary tract infections. She also complains of bladder spasms. She is not experiencing fevers, chills, nausea or vomiting.    She had a urine culture and forward 24th 2017 that showed Escherichia coli was pansensitive except for intermittent resistance to cephalothin.  The patient underwent cystoscopy today which was normal to vision was somewhat limited by concurrent use of uribel.    She she has been on uribel which is significantly improved her dysuria. She is also treated recently for urinary tract infection is currently taking Macrobid.  PMH: Past Medical History  Diagnosis Date  . Hypertension   . Hyperlipidemia   . Arthritis   . Hemihypertrophy   . Myocardial  infarction ([place])   . Thyroid disease   . CVA (cerebral infarction) 2003  . Peripheral vascular disease ([place])     s/p stent right leg  . Pulmonary nodule     stable on Chest CT March 2014, Followed at [facility]  . Murmur, cardiac   . Chronic cystitis   . Hematuria   . Bleeding disorder ([place])   . Recurrent UTI   . Gross hematuria   . Urinary frequency   . Dysuria   . Sensory urge incontinence   . History of kidney stones     Surgical History: Past Surgical History  Procedure Laterality Date  . Abdominal hysterectomy  1982  . Total hip arthroplasty      right  . Spine surgery      steel rod in back  . Tonsillectomy    . Left rotator cuff      repair  . Total knee arthroplasty Left   . Hip surgery  2007    replaced ball in right hip  . Cardiac surgery      2 failed stents in right leg  . Stent placement leg Right 2015  . Carpal tunnel release    . Appendectomy    . Ruptured disc      Home Medications:    Medication List       This list is accurate as of: 04/13/15  3:04 PM.  Always use your most recent med list.               acetaminophen 325 MG tablet  Commonly known as:  TYLENOL  Take 325 mg by mouth every 6 (six)  hours as needed for mild pain, moderate pain or headache. Reported on 02/27/2015     acetaminophen 650 MG CR tablet  Commonly known as:  TYLENOL  Take 650 mg by mouth 3 (three) times daily. Reported on 04/13/2015     amLODipine 5 MG tablet  Commonly known as:  NORVASC  Take 5 mg by mouth daily.     apixaban 5 MG Tabs tablet  Commonly known as:  ELIQUIS  Take 1 tablet (5 mg total) by mouth 2 (two) times daily.     bifidobacterium infantis capsule  Take 1 capsule by mouth daily.     cefUROXime 250 MG tablet  Commonly known as:  CEFTIN  Reported on 04/13/2015     clopidogrel 75 MG tablet  Commonly known as:  PLAVIX     Cranberry 425 MG Caps  Take 425 mg by mouth daily.     cyanocobalamin 1000 MCG/ML injection  Commonly known as:  (VITAMIN  B-12)  Inject 1,000 mcg into the muscle every 30 (thirty) days. On the 2nd of each month     ergocalciferol 50000 units capsule  Commonly known as:  VITAMIN D2  Take 50,000 Units by mouth once a week.     estradiol 0.1 MG/GM vaginal cream  Commonly known as:  ESTRACE VAGINAL  Apply 0.5mg  (pea-sized amount)  just inside the vaginal introitus with a finger-tip every night for two weeks and then Monday, Wednesday and Friday nights.     levothyroxine 25 MCG tablet  Commonly known as:  SYNTHROID, LEVOTHROID  TAKE 1 TABLET (25 MCG TOTAL) BY MOUTH DAILY.     loratadine 10 MG tablet  Commonly known as:  CLARITIN  Take 1 tablet (10 mg total) by mouth daily.     magnesium hydroxide 400 MG/5ML suspension  Commonly known as:  MILK OF MAGNESIA  Take 30 mLs by mouth daily as needed for mild constipation or moderate constipation.     metoprolol succinate 25 MG 24 hr tablet  Commonly known as:  TOPROL-XL  Take 12.5 mg by mouth daily.     nitrofurantoin (macrocrystal-monohydrate) 100 MG capsule  Commonly known as:  MACROBID     oxybutynin 5 MG tablet  Commonly known as:  DITROPAN  Take 0.5 tablets (2.5 mg total) by mouth 2 (two) times daily.     phenazopyridine 100 MG tablet  Commonly known as:  PYRIDIUM  Take 1 tablet (100 mg total) by mouth 3 (three) times daily as needed for pain.     polyethylene glycol packet  Commonly known as:  MIRALAX / GLYCOLAX  Take 17 g by mouth daily as needed for mild constipation, moderate constipation or severe constipation. Reported on 02/27/2015     pravastatin 40 MG tablet  Commonly known as:  PRAVACHOL  Take 1 tablet (40 mg total) by mouth every evening.     senna-docusate 8.6-50 MG tablet  Commonly known as:  Senokot-S  Take 1 tablet by mouth 2 (two) times daily.     sodium chloride 0.65 % Soln nasal spray  Commonly known as:  OCEAN  Place 1 spray into both nostrils every 2 (two) hours as needed for congestion.     traMADol 50 MG tablet    Commonly known as:  ULTRAM  Take 25 mg by mouth every 8 (eight) hours as needed for moderate pain or severe pain.        Allergies:  Allergies  Allergen Reactions  . Sulfa Antibiotics Hives and Itching  .  Ciprofloxacin Hives and Itching  . Levofloxacin Hives and Itching  . Penicillins Hives and Itching    Family History: Family History  Problem Relation Age of Onset  . Hypertension Mother   . Transient ischemic attack Mother   . Kidney disease Neg Hx   . Bladder Cancer Neg Hx   . Prostate cancer Neg Hx     Social History:  reports that she has never smoked. She has never used smokeless tobacco. She reports that she does not drink alcohol or use illicit drugs.  ROS:                                        Physical Exam: BP 119/63 mmHg  Pulse 64  Ht 5\' 3"  (1.6 m)  Wt 121 lb (54.885 kg)  BMI 21.44 kg/m2  Constitutional:  Alert and oriented, No acute distress. HEENT: [place] AT, moist mucus membranes.  Trachea midline, no masses. Cardiovascular: No clubbing, cyanosis, or edema. Respiratory: Normal respiratory effort, no increased work of breathing. GI: Abdomen is soft, nontender, nondistended, no abdominal masses GU: No CVA tenderness.  Skin: No rashes, bruises or suspicious lesions. Lymph: No cervical or inguinal adenopathy. Neurologic: Grossly intact, no focal deficits, moving all 4 extremities. Psychiatric: Normal mood and affect.  Laboratory Data: Lab Results  Component Value Date   WBC 6.3 02/26/2015   HGB 14.0 02/26/2015   HCT 42.2 02/26/2015   MCV 94.9 02/26/2015   PLT 203 02/26/2015    Lab Results  Component Value Date   CREATININE 0.68 02/26/2015    No results found for: PSA  No results found for: TESTOSTERONE  Lab Results  Component Value Date   HGBA1C 5.5 05/19/2014    Urinalysis    Component Value Date/Time   COLORURINE YELLOW* 02/26/2015 0137   COLORURINE Yellow 04/18/2014 1455   APPEARANCEUR Cloudy* 03/16/2015  1352   APPEARANCEUR HAZY* 02/26/2015 0137   APPEARANCEUR Cloudy 04/18/2014 1455   LABSPEC 1.010 02/26/2015 0137   LABSPEC 1.019 04/18/2014 1455   PHURINE 7.0 02/26/2015 0137   PHURINE 5.0 04/18/2014 1455   GLUCOSEU Trace* 03/16/2015 1352   GLUCOSEU Negative 04/18/2014 1455   HGBUR 3+* 02/26/2015 0137   HGBUR 3+ 04/18/2014 1455   BILIRUBINUR Negative 03/16/2015 1352   BILIRUBINUR NEGATIVE 02/26/2015 0137   BILIRUBINUR Negative 04/18/2014 1455   KETONESUR NEGATIVE 02/26/2015 0137   KETONESUR Negative 04/18/2014 1455   PROTEINUR 2+* 03/16/2015 1352   PROTEINUR 100* 02/26/2015 0137   PROTEINUR 30 mg/dL 04/18/2014 1455   UROBILINOGEN 1.0 05/20/2014 0225   NITRITE Positive* 03/16/2015 1352   NITRITE NEGATIVE 02/26/2015 0137   NITRITE Negative 04/18/2014 1455   LEUKOCYTESUR 3+* 03/16/2015 1352   LEUKOCYTESUR 2+* 02/26/2015 0137   LEUKOCYTESUR 2+ 04/18/2014 1455    Pertinent Imaging: CLINICAL DATA: Acute onset of lower abdominal pain, dysuria and diarrhea. Initial encounter.  EXAM: CT ABDOMEN AND PELVIS WITH CONTRAST  TECHNIQUE: Multidetector CT imaging of the abdomen and pelvis was performed using the standard protocol following bolus administration of intravenous contrast.  CONTRAST: 158mL OMNIPAQUE IOHEXOL 300 MG/ML SOLN  COMPARISON: CT of the abdomen and pelvis from 12/28/2014  FINDINGS: The visualized lung bases are clear. Scattered coronary artery calcification is noted.  The liver and spleen are unremarkable in appearance. The gallbladder is within normal limits. The common bile duct measures 8 mm, within normal limits for the patient's age. The pancreas and  adrenal glands are unremarkable.  Scattered bilateral renal cysts are noted. A 5 mm stone is noted near the upper pole of the left kidney. The kidneys are otherwise unremarkable. There is no evidence of hydronephrosis. No obstructing ureteral stones are seen. No perinephric stranding is  appreciated.  No free fluid is identified. The small bowel is unremarkable in appearance. The stomach is within normal limits. No acute vascular abnormalities are seen. Scattered calcification is noted along the abdominal aorta and its branches, including along the superior mesenteric artery.  The patient is status post appendectomy. Mild diverticulosis is noted along the sigmoid colon. The colon is otherwise unremarkable.  The bladder is mildly distended and grossly unremarkable. The patient is status post hysterectomy. No suspicious adnexal masses are seen. No inguinal lymphadenopathy is seen.  No acute osseous abnormalities are identified. The patient's right hip hemiarthroplasty is grossly unremarkable in appearance. The patient is status post lumbar spinal fusion at L4-L5. There is multilevel vacuum phenomenon along the lumbar spine, with grade 1 anterolisthesis of L3 on L4 and L4 on L5, and grade 1 retrolisthesis of T12 on L1, L1 on L2, and L2 on L3.  IMPRESSION: 1. No acute abnormality seen to explain the patient's symptoms. 2. Scattered bilateral renal cysts seen. 5 mm nonobstructing stone near the upper pole of the left kidney. 3. Scattered coronary artery calcifications seen. 4. Scattered calcification along the abdominal aorta and its branches, including along the superior mesenteric artery. 5. Mild diverticulosis along the sigmoid colon. 6. Mild diffuse degenerative change along the lumbar spine.   Cystoscopy Procedure Note  Patient identification was confirmed, informed consent was obtained, and patient was prepped using Betadine solution.  Lidocaine jelly was administered per urethral meatus.    Preoperative abx where received prior to procedure.    Procedure: - Flexible cystoscope introduced, without any difficulty.   - Thorough search of the bladder revealed:    normal urethral meatus    normal urothelium    no stones    no ulcers     no tumors     no urethral polyps    no trabeculation  Vision somewhat limited due to concurrent use of urogesic blue.  - Ureteral orifices were normal in position and appearance.  Post-Procedure: - Patient tolerated the procedure well   Assessment & Plan:    1. Microscopic hematuria -negative workup except for nonobstructing 4 mm left renal stone.  2. Dysuria:The patient was given more samples of Uribel this helped her symptoms.  3. Atrophic vaginitis:Continue vaginal estrogen cream Monday, Wednesday, and Friday. 0.5mg    4. Recurrent UTI: will treat atrophic vaginitis and see if this resolves her recurrent urinary tract infections. Recommend only treating symptomatic urinary tract infections. Recommend against treating asymptomatic bacteriuria. Patient instructed to bring a urine sample to her our office if she feels like she is getting a urinary tract infection. He is not even clear this point if her recurrent urinary tract diagnosis is accurate due to lack of positive cultures available to me at this time.  5. Nephrolithiasis 4 mm left nonobstructing renal calculus. No further workup needed.  Return in about 3 months (around 07/14/2015) for with [facility].  Nickie Retort, MD  [facility] [address] [place], [place] [address] [phone]

## 2015-04-13 NOTE — Telephone Encounter (Signed)
She refused transfer to ER last night Doing some better On bactrim for UTI--- CXR shows more bronchial changes than pneumonia ---so will continue that (multiple antibiotic allergies) Rollene Fare saw her today

## 2015-04-13 NOTE — Telephone Encounter (Signed)
PLEASE NOTE: All timestamps contained within this report are represented as [place] Standard Time. CONFIDENTIALTY NOTICE: This fax transmission is intended only for the addressee. It contains information that is legally privileged, confidential or otherwise protected from use or disclosure. If you are not the intended recipient, you are strictly prohibited from reviewing, disclosing, copying using or disseminating any of this information or taking any action in reliance on or regarding this information. If you have received this fax in error, please notify us immediately by telephone so that we can arrange for its return to [place]. Phone: [phone], Toll-Free: [phone], Fax: [phone] Page: 1 of 1 Call Id: [id number] [place] Patient Name: Krista Ellis Gender: Female DOB: 11-Jan-1931 Age: 82 Y 65 M 8 D Return Phone Number: Address: City/State/Zip: [place] Client [place] [facility] - Client Client Site [place] - Day Physician Viviana Simpler Contact Type Call Who Is Calling Lab Lab Name [facility] Phone Number [phone] Lab Tech Name [facility] Lab Reference Number n/a Chief Complaint Lab Result Call Type Lab Send to RN Reason for Call Report lab results Initial Comment Caryl Pina with [facility] at [phone]. Needs to report results for chest x-ray. Additional Comment Translation No Nurse Assessment Guidelines Guideline Title Affirmed Question Affirmed Notes Nurse Date/Time (Eastern Time) Disp. Time Eilene Ghazi Time) Disposition Final User 04/12/2015 6:59:05 PM Called On-Call Provider Laurena Bering, RN, Helene Kelp 04/12/2015 7:01:32 PM Clinical Call Yes Laurena Bering, RN, Helene Kelp Comments User: Dorothyann Peng, RN Date/Time Eilene Ghazi Time): 04/12/2015 7:01:23 PM Notified caller to send patient to ER per MD order. Verbalized understanding Paging DoctorName Phone  DateTime Result/Outcome Message Type Notes Cathlean Cower [id number] 04/12/2015 6:59:05 PM Called On Call Provider - Reached Doctor Paged Cathlean Cower 04/12/2015 6:59:50 PM Spoke with On Call - General Message Result Gave report to MD. Instructed to send to ER

## 2015-04-15 ENCOUNTER — Telehealth: Payer: Self-pay | Admitting: Family Medicine

## 2015-04-15 ENCOUNTER — Encounter: Payer: Self-pay | Admitting: Emergency Medicine

## 2015-04-15 ENCOUNTER — Emergency Department: Payer: Medicare Other

## 2015-04-15 ENCOUNTER — Emergency Department
Admission: EM | Admit: 2015-04-15 | Discharge: 2015-04-15 | Disposition: A | Discharge status: Home / Self Care | Payer: Medicare Other | Attending: Emergency Medicine | Admitting: Emergency Medicine

## 2015-04-15 DIAGNOSIS — R0789 Other chest pain: Secondary | ICD-10-CM | POA: Insufficient documentation

## 2015-04-15 DIAGNOSIS — I10 Essential (primary) hypertension: Secondary | ICD-10-CM | POA: Insufficient documentation

## 2015-04-15 DIAGNOSIS — Z79899 Other long term (current) drug therapy: Secondary | ICD-10-CM | POA: Insufficient documentation

## 2015-04-15 DIAGNOSIS — Z880 Allergy status to penicillin: Secondary | ICD-10-CM | POA: Diagnosis not present

## 2015-04-15 DIAGNOSIS — E119 Type 2 diabetes mellitus without complications: Secondary | ICD-10-CM | POA: Insufficient documentation

## 2015-04-15 DIAGNOSIS — R079 Chest pain, unspecified: Secondary | ICD-10-CM | POA: Diagnosis present

## 2015-04-15 DIAGNOSIS — I252 Old myocardial infarction: Secondary | ICD-10-CM | POA: Diagnosis not present

## 2015-04-15 DIAGNOSIS — Z79890 Hormone replacement therapy (postmenopausal): Secondary | ICD-10-CM | POA: Diagnosis not present

## 2015-04-15 DIAGNOSIS — I509 Heart failure, unspecified: Secondary | ICD-10-CM | POA: Diagnosis not present

## 2015-04-15 DIAGNOSIS — R0602 Shortness of breath: Secondary | ICD-10-CM | POA: Diagnosis not present

## 2015-04-15 DIAGNOSIS — Z743 Need for continuous supervision: Secondary | ICD-10-CM | POA: Diagnosis not present

## 2015-04-15 DIAGNOSIS — R918 Other nonspecific abnormal finding of lung field: Secondary | ICD-10-CM | POA: Diagnosis not present

## 2015-04-15 LAB — BASIC METABOLIC PANEL
ANION GAP: 5 (ref 5–15)
BUN: 17 mg/dL (ref 6–20)
CHLORIDE: 101 mmol/L (ref 101–111)
CO2: 26 mmol/L (ref 22–32)
Calcium: 9.5 mg/dL (ref 8.9–10.3)
Creatinine, Ser: 0.64 mg/dL (ref 0.44–1.00)
GFR calc Af Amer: >60 mL/min (ref >60)
GLUCOSE: 102 mg/dL — AB (ref 65–99)
POTASSIUM: 4.1 mmol/L (ref 3.5–5.1)
Sodium: 132 mmol/L — ABNORMAL LOW (ref 135–145)

## 2015-04-15 LAB — TROPONIN I

## 2015-04-15 LAB — CBC
HCT: 38.9 % (ref 35.0–47.0)
HEMOGLOBIN: 13 g/dL (ref 12.0–16.0)
MCH: 31.6 pg (ref 26.0–34.0)
MCHC: 33.4 g/dL (ref 32.0–36.0)
MCV: 94.6 fL (ref 80.0–100.0)
Platelets: 225 10*3/uL (ref 150–440)
RBC: 4.11 MIL/uL (ref 3.80–5.20)
RDW: 13.2 % (ref 11.5–14.5)
WBC: 6 10*3/uL (ref 3.6–11.0)

## 2015-04-15 NOTE — Telephone Encounter (Signed)
On-call note: Pt with temp 100.2. C/o chest heaviness but she is not diaphoretic, SOB, or having pain in chest.  Nurse requesting abx for bronchitis b/c this is what a CXR earlier this week (done for fever) showed.  However, pt not having much cough at all per nurse. Pt's daughter did not want pt taken to hosp for CP b/c she suspects bronchitis but she is currently not at the facility but is going up there later. I requested a repeat CXR and rx'd no meds at this time.  Nurse to call back when CXR done and daughter has come to take a look at her mom.

## 2015-04-15 NOTE — ED Provider Notes (Signed)
[facility] Emergency Department Provider Note  ____________________________________________  Time seen: Approximately 4:54 PM  I have reviewed the triage vital signs and the nursing notes.   HISTORY  Chief Complaint Chest Pain    HPI Krista Ellis is a 82 y.o. female with past medical historyas listed below now lives at an assisted living facility presents for chest pressure since yesterday.  The onset was gradual and the symptoms of a persistent but mild in severity.  She denies any shortness of breath, sharp chest pain, nausea, vomiting, diarrhea, dysuria, headache.  Nothing makes it better and nothing makes it worse.  Reportedly the facility obtained a chest x-ray which suggested bronchitis and possibly some mild CHF.  She is in no acute distress with no increased work of breathing and has normal vital signs.   Past Medical History  Diagnosis Date  . Hypertension   . Hyperlipidemia   . Arthritis   . Hemihypertrophy   . Myocardial infarction ([place])   . Thyroid disease   . CVA (cerebral infarction) 2003  . Peripheral vascular disease ([place])     s/p stent right leg  . Pulmonary nodule     stable on Chest CT March 2014, [facility] at [facility]  . Murmur, cardiac   . Chronic cystitis   . Hematuria   . Bleeding disorder ([place])   . Recurrent UTI   . Gross hematuria   . Urinary frequency   . Dysuria   . Sensory urge incontinence   . History of kidney stones     Patient Active Problem List   Diagnosis Date Noted  . Acid reflux 04/13/2015  . Combined fat and carbohydrate induced hyperlipemia 04/13/2015  . Dysuria 03/01/2015  . Atrophic vaginitis 03/01/2015  . Microscopic hematuria 03/01/2015  . Bladder spasms 03/01/2015  . Pulmonary embolism ([place]) 09/22/2014  . UTI (urinary tract infection) 09/14/2014  . Enterococcus UTI 09/14/2014  . Generalized weakness 09/14/2014  . Sepsis ([place]) 09/10/2014  . Open wound of knee, leg (except thigh), and ankle,  complicated [id number]  . Buttock pain 07/19/2014  . Hospital discharge follow-up 07/04/2014  . Facial droop   . Malnutrition of moderate degree ([place]) 05/21/2014  . Seizures ([place])   . TIA (transient ischemic attack) 05/18/2014  . Systolic murmur [id number]  . Gait disturbance 05/15/2014  . Benign essential HTN 05/08/2014  . History of subdural hematoma   . Subdural hematoma ([place]) 05/05/2014  . B12 deficiency 03/09/2014  . Bradycardia 02/24/2014  . Arteriosclerosis of coronary artery 02/23/2014  . Recurrent UTI 01/03/2014  . Bilateral hand numbness 01/03/2014  . Nephrolithiasis 11/18/2013  . Recurrent falls 11/18/2013  . Closed rib fracture 09/25/2013  . Abnormal CT scan, chest 09/01/2013  . Chest wall pain 08/31/2013  . Hematoma and contusion 08/31/2013  . Atypical chest pain 06/17/2013  . Peripheral vascular disease ([place]) 06/17/2013  . Heart valve disease 06/17/2013  . Atherosclerotic peripheral vascular disease ([place]) 06/01/2013  . Bladder wall hemorrhage 12/10/2012  . Dysphagia, pharyngoesophageal phase 12/10/2012  . Malaise and fatigue 12/10/2012  . Frank hematuria 12/02/2012  . Bladder retention 12/02/2012  . Postmenopausal estrogen deficiency 11/30/2012  . Glucose found in urine on examination 11/30/2012  . Post menopausal syndrome 11/30/2012  . Type 2 diabetes mellitus with hyperglycemia ([place]) 11/24/2012  . Lung nodule, solitary 03/12/2012  . Diverticular disease of large intestine 12/25/2011  . 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 Symptoms involving urinary system 12/04/2011  . Medicare annual wellness visit,  subsequent 10/01/2011  . Dyslipidemia 10/01/2011  . HLD (hyperlipidemia) 10/01/2011  . Encounter for general adult medical examination without abnormal findings 10/01/2011  . Bladder infection, chronic 09/29/2011  . Incomplete bladder emptying 09/29/2011  . Urge incontinence of urine 09/29/2011  . Delayed onset of urination  09/29/2011  . Hypertension 10/17/2010  . Hypothyroidism 10/17/2010  . Osteoarthritis 10/17/2010  . Essential (primary) hypertension 10/17/2010  . Arthritis, degenerative 10/17/2010  . CN (constipation) 05/02/2010    Past Surgical History  Procedure Laterality Date  . Abdominal hysterectomy  1982  . Total hip arthroplasty      right  . Spine surgery      steel rod in back  . Tonsillectomy    . Left rotator cuff      repair  . Total knee arthroplasty Left   . Hip surgery  2007    replaced ball in right hip  . Cardiac surgery      2 failed stents in right leg  . Stent placement leg Right 2015  . Carpal tunnel release    . Appendectomy    . Ruptured disc      Current Outpatient Rx  Name  Route  Sig  Dispense  Refill  . acetaminophen (TYLENOL) 325 MG tablet   Oral   Take 325 mg by mouth every 6 (six) hours as needed for mild pain, moderate pain or headache. Reported on 02/27/2015         . acetaminophen (TYLENOL) 650 MG CR tablet   Oral   Take 650 mg by mouth 3 (three) times daily. Reported on 04/13/2015         . amLODipine (NORVASC) 5 MG tablet   Oral   Take 5 mg by mouth daily.         [place] apixaban (ELIQUIS) 5 MG TABS tablet   Oral   Take 1 tablet (5 mg total) by mouth 2 (two) times daily. Patient not taking: Reported on 04/13/2015   60 tablet   0   . bifidobacterium infantis (ALIGN) capsule   Oral   Take 1 capsule by mouth daily.         . cefUROXime (CEFTIN) 250 MG tablet      Reported on 04/13/2015         . clopidogrel (PLAVIX) 75 MG tablet               . Cranberry 425 MG CAPS   Oral   Take 425 mg by mouth daily.         . cyanocobalamin (,VITAMIN B-12,) 1000 MCG/ML injection   Intramuscular   Inject 1,000 mcg into the muscle every 30 (thirty) days. On the 2nd of each month         . ergocalciferol (VITAMIN D2) 50000 units capsule   Oral   Take 50,000 Units by mouth once a week.         . estradiol (ESTRACE VAGINAL) 0.1 MG/GM  vaginal cream      Apply 0.5mg  (pea-sized amount)  just inside the vaginal introitus with a finger-tip every night for two weeks and then Monday, Wednesday and Friday nights.   30 g   12   . levothyroxine (SYNTHROID, LEVOTHROID) 25 MCG tablet      TAKE 1 TABLET (25 MCG TOTAL) BY MOUTH DAILY. Patient taking differently: Take 25 mcg by mouth daily before breakfast.    90 tablet   1   . loratadine (CLARITIN) 10 MG tablet  Oral   Take 1 tablet (10 mg total) by mouth daily. Patient taking differently: Take 10 mg by mouth daily as needed for allergies.          . magnesium hydroxide (MILK OF MAGNESIA) 400 MG/5ML suspension   Oral   Take 30 mLs by mouth daily as needed for mild constipation or moderate constipation.         . metoprolol succinate (TOPROL-XL) 25 MG 24 hr tablet   Oral   Take 12.5 mg by mouth daily.          . nitrofurantoin, macrocrystal-monohydrate, (MACROBID) 100 MG capsule               . oxybutynin (DITROPAN) 5 MG tablet   Oral   Take 0.5 tablets (2.5 mg total) by mouth 2 (two) times daily.   10 tablet   0   . phenazopyridine (PYRIDIUM) 100 MG tablet   Oral   Take 1 tablet (100 mg total) by mouth 3 (three) times daily as needed for pain. Patient not taking: Reported on 04/13/2015   12 tablet   0   . polyethylene glycol (MIRALAX / GLYCOLAX) packet   Oral   Take 17 g by mouth daily as needed for mild constipation, moderate constipation or severe constipation. Reported on 02/27/2015         . pravastatin (PRAVACHOL) 40 MG tablet   Oral   Take 1 tablet (40 mg total) by mouth every evening. Patient taking differently: Take 40 mg by mouth at bedtime.    90 tablet   1   . senna-docusate (SENOKOT-S) 8.6-50 MG tablet   Oral   Take 1 tablet by mouth 2 (two) times daily.         . sodium chloride (OCEAN) 0.65 % SOLN nasal spray   Each Nare   Place 1 spray into both nostrils every 2 (two) hours as needed for congestion.         . traMADol  (ULTRAM) 50 MG tablet   Oral   Take 25 mg by mouth every 8 (eight) hours as needed for moderate pain or severe pain.           Allergies Sulfa antibiotics; Ciprofloxacin; Levofloxacin; and Penicillins  Family History  Problem Relation Age of Onset  . Hypertension Mother   . Transient ischemic attack Mother   . Kidney disease Neg Hx   . Bladder Cancer Neg Hx   . Prostate cancer Neg Hx     Social History Social History  Substance Use Topics  . Smoking status: Never Smoker   . Smokeless tobacco: Never Used  . Alcohol Use: No    Review of Systems Constitutional: No fever/chills Eyes: No visual changes. ENT: No sore throat. Cardiovascular: Mild chest pressure, no pain Respiratory: Denies shortness of breath. Gastrointestinal: No abdominal pain.  No nausea, no vomiting.  No diarrhea.  No constipation. Genitourinary: Negative for dysuria. Musculoskeletal: Negative for back pain. Skin: Negative for rash. Neurological: Negative for headaches, focal weakness or numbness.  10-point ROS otherwise negative.  ____________________________________________   PHYSICAL EXAM:  VITAL SIGNS: ED Triage Vitals  Enc Vitals Group     BP 04/15/15 1523 147/88 mmHg     Pulse Rate 04/15/15 1523 77     Resp 04/15/15 1523 22     Temp 04/15/15 1523 98.4 F (36.9 C)     Temp Source 04/15/15 1523 Oral     SpO2 04/15/15 1615 95 %  Weight 04/15/15 1523 128 lb 15.5 oz (58.5 kg)     Height --      Head Cir --      Peak Flow --      Pain Score 04/15/15 1526 4     Pain Loc --      Pain Edu? --      Excl. in [place]? --     Constitutional: Alert and oriented. Well appearing and in no acute distress. Eyes: Conjunctivae are normal. PERRL. EOMI. Head: Atraumatic. Nose: No congestion/rhinnorhea. Mouth/Throat: Mucous membranes are moist.  Oropharynx non-erythematous. Neck: No stridor.  No meningeal signs.   Cardiovascular: Normal rate, regular rhythm. Good peripheral circulation. Grossly  normal heart sounds.   Respiratory: Normal respiratory effort.  No retractions. Lungs CTAB.  No reproducible TTP.   Gastrointestinal: Soft and nontender. No distention.  Musculoskeletal: No lower extremity tenderness nor edema. No gross deformities of extremities. Neurologic:  Normal speech and language. No gross focal neurologic deficits are appreciated.  Skin:  Skin is warm, dry and intact. No rash noted. Psychiatric: Mood and affect are normal. Speech and behavior are normal.  ____________________________________________   LABS (all labs ordered are listed, but only abnormal results are displayed)  Labs Reviewed  BASIC METABOLIC PANEL - Abnormal; Notable for the following:    Sodium 132 (*)    Glucose, Bld 102 (*)    All other components within normal limits  CBC  TROPONIN I   ____________________________________________  EKG  ED ECG REPORT I, Deja Pisarski, the attending physician, personally viewed and interpreted this ECG.  Date: 04/15/2015 EKG Time: 15:25 Rate: 76 Rhythm: normal sinus rhythm QRS Axis: normal Intervals: normal ST/T Wave abnormalities: normal Conduction Disturbances: none Narrative Interpretation: unremarkable  ____________________________________________  RADIOLOGY  Bennie Hind, Skylah Delauter, personally discussed these images and results by phone with the on-call radiologist and used this discussion as part of my medical decision making.    Dg Chest 2 View  04/15/2015  CLINICAL DATA:  Bronchitis. CHF. Chest pressure and shortness of breath. EXAM: CHEST  2 VIEW COMPARISON:  CT chest 09/22/2014.  Chest radiograph 09/22/2014. FINDINGS: Heart size is within normal limits. Thoracic atherosclerosis is present. There are no areas of lobar consolidation or edema, but there are areas of tiny nodularity throughout both lung fields which are increasingly prominent compared with priors. Miliary infection or tumor is not excluded. CT chest could provide additional  information. Osteopenia. No significant effusion or pneumothorax. IMPRESSION: Tiny foci of nodularity throughout both lung fields increasingly prominent from priors is nonspecific. No overt pneumonia or pulmonary edema. Electronically Signed   By: Staci Righter M.D.   On: 04/15/2015 15:57   Ct Chest Wo Contrast  04/15/2015  CLINICAL DATA:  Pt with chest pressure that worsened today. Pt states she has not been feeling well this week. Chest xray from earlier today recommended follow up CT. EXAM: CT CHEST WITHOUT CONTRAST TECHNIQUE: Multidetector CT imaging of the chest was performed following the standard protocol without IV contrast. COMPARISON:  Current chest radiograph.  Chest CT, 09/22/2014 FINDINGS: Neck base and axilla: No mass or adenopathy. 2 mm low-density right thyroid nodule. Mediastinum and hila: Heart normal in size. Mild coronary artery calcifications. Great vessels are normal in caliber. Mildly enlarged subcarinal lymph node measuring 14 mm short axis. No mediastinal or hilar masses. No hilar adenopathy. Lungs and pleura: There are scattered alert pulmonary nodules. Largest lies in the left upper lobe, image 30, series 3, measuring 5 mm. There is mild  heterogeneous interstitial thickening. Additional opacity consistent with atelectasis is noted in the lower lobes, right middle lobe and left upper lobe lingula associated with bronchial wall thickening. No focal consolidation is seen to suggest pneumonia. No pulmonary edema. No pleural effusion or pneumothorax. Limited upper abdomen:  No acute finding. Musculoskeletal: Bony thorax is diffusely demineralized. There are degenerative changes along the visualized spine. No osteoblastic or osteolytic lesions. Arthropathic changes are noted of the left shoulder. IMPRESSION: 1. Lung base bronchial wall thickening with associated atelectasis. Bronchial wall thickening may all be chronic. Consider acute bronchitis in the proper clinical setting. 2. No evidence  of pneumonia or pulmonary edema. 3. Scattered small pulmonary nodules. No follow-up needed if patient is low-risk (and has no known or suspected primary neoplasm). Non-contrast chest CT can be considered in 12 months if patient is high-risk. This recommendation follows the consensus statement: Guidelines for Management of Incidental Pulmonary Nodules Detected on CT Images:From the Fleischner Society 2017; published online before print (10.1148/radiol.IJ:2314499). Electronically Signed   By: Lajean Manes M.D.   On: 04/15/2015 18:25    ____________________________________________   PROCEDURES  Procedure(s) performed: None  Critical Care performed: No ____________________________________________   INITIAL IMPRESSION / ASSESSMENT AND PLAN / ED COURSE  Pertinent labs & imaging results that were available during my care of the patient were reviewed by me and considered in my medical decision making (see chart for details).  I discussed the chest x-ray with the radiologist (a different radiologist and Dr. Jola Baptist).  As recommended on the chest x-ray report, I will proceed with a CT of the chest to make sure she does not have a more insidious pathology that is not showing up on the chest x-ray, but the radiologist with whom I spoke felt that a noncontrasted study would be fine.  I informed the patient and family of the plan.  ----------------------------------------- 7:15 PM on 04/15/2015 -----------------------------------------  Chest CT non-acute.  Patient very stable and eager to go home.  NAD.  I gave my usual and customary return precautions.  ____________________________________________  FINAL CLINICAL IMPRESSION(S) / ED DIAGNOSES  Final diagnoses:  Pressure in chest      NEW MEDICATIONS STARTED DURING THIS VISIT:  Discharge Medication List as of 04/15/2015  7:18 PM        Note:  This document was prepared using Dragon voice recognition software and may include  unintentional dictation errors.   Hinda Kehr, MD 04/15/15 [phone]

## 2015-04-15 NOTE — ED Notes (Signed)
Patient transported to X-ray 

## 2015-04-15 NOTE — ED Notes (Signed)
Pt back from x-ray, cont to monitor

## 2015-04-15 NOTE — Discharge Instructions (Signed)
As we discussed, your workup today was reassuring.  Though we do not know exactly what is causing your symptoms, it appears that you have no emergent medical condition at this time are safe to go home and follow up as recommended in this paperwork. ° °Please return immediately to the Emergency Department if you develop any new or worsening symptoms that concern you. ° ° °Nonspecific Chest Pain  °Chest pain can be caused by many different conditions. There is always a chance that your pain could be related to something serious, such as a heart attack or a blood clot in your lungs. Chest pain can also be caused by conditions that are not life-threatening. If you have chest pain, it is very important to follow up with your health care provider. °CAUSES  °Chest pain can be caused by: °· Heartburn. °· Pneumonia or bronchitis. °· Anxiety or stress. °· Inflammation around your heart (pericarditis) or lung (pleuritis or pleurisy). °· A blood clot in your lung. °· A collapsed lung (pneumothorax). It can develop suddenly on its own (spontaneous pneumothorax) or from trauma to the chest. °· Shingles infection (varicella-zoster virus). °· Heart attack. °· Damage to the bones, muscles, and cartilage that make up your chest wall. This can include: °¨ Bruised bones due to injury. °¨ Strained muscles or cartilage due to frequent or repeated coughing or overwork. °¨ Fracture to one or more ribs. °¨ Sore cartilage due to inflammation (costochondritis). °RISK FACTORS  °Risk factors for chest pain may include: °· Activities that increase your risk for trauma or injury to your chest. °· Respiratory infections or conditions that cause frequent coughing. °· Medical conditions or overeating that can cause heartburn. °· Heart disease or family history of heart disease. °· Conditions or health behaviors that increase your risk of developing a blood clot. °· Having had chicken pox (varicella zoster). °SIGNS AND SYMPTOMS °Chest pain can feel  like: °· Burning or tingling on the surface of your chest or deep in your chest. °· Crushing, pressure, aching, or squeezing pain. °· Dull or sharp pain that is worse when you move, cough, or take a deep breath. °· Pain that is also felt in your back, neck, shoulder, or arm, or pain that spreads to any of these areas. °Your chest pain may come and go, or it may stay constant. °DIAGNOSIS °Lab tests or other studies may be needed to find the cause of your pain. Your health care provider may have you take a test called an ambulatory ECG (electrocardiogram). An ECG records your heartbeat patterns at the time the test is performed. You may also have other tests, such as: °· Transthoracic echocardiogram (TTE). During echocardiography, sound waves are used to create a picture of all of the heart structures and to look at how blood flows through your heart. °· Transesophageal echocardiogram (TEE). This is a more advanced imaging test that obtains images from inside your body. It allows your health care provider to see your heart in finer detail. °· Cardiac monitoring. This allows your health care provider to monitor your heart rate and rhythm in real time. °· Holter monitor. This is a portable device that records your heartbeat and can help to diagnose abnormal heartbeats. It allows your health care provider to track your heart activity for several days, if needed. °· Stress tests. These can be done through exercise or by taking medicine that makes your heart beat more quickly. °· Blood tests. °· Imaging tests. °TREATMENT  °Your treatment depends on what   is causing your chest pain. Treatment may include: °· Medicines. These may include: °¨ Acid blockers for heartburn. °¨ Anti-inflammatory medicine. °¨ Pain medicine for inflammatory conditions. °¨ Antibiotic medicine, if an infection is present. °¨ Medicines to dissolve blood clots. °¨ Medicines to treat coronary artery disease. °· Supportive care for conditions that do not  require medicines. This may include: °¨ Resting. °¨ Applying heat or cold packs to injured areas. °¨ Limiting activities until pain decreases. °HOME CARE INSTRUCTIONS °· If you were prescribed an antibiotic medicine, finish it all even if you start to feel better. °· Avoid any activities that bring on chest pain. °· Do not use any tobacco products, including cigarettes, chewing tobacco, or electronic cigarettes. If you need help quitting, ask your health care provider. °· Do not drink alcohol. °· Take medicines only as directed by your health care provider. °· Keep all follow-up visits as directed by your health care provider. This is important. This includes any further testing if your chest pain does not go away. °· If heartburn is the cause for your chest pain, you may be told to keep your head raised (elevated) while sleeping. This reduces the chance that acid will go from your stomach into your esophagus. °· Make lifestyle changes as directed by your health care provider. These may include: °¨ Getting regular exercise. Ask your health care provider to suggest some activities that are safe for you. °¨ Eating a heart-healthy diet. A registered dietitian can help you to learn healthy eating options. °¨ Maintaining a healthy weight. °¨ Managing diabetes, if necessary. °¨ Reducing stress. °SEEK MEDICAL CARE IF: °· Your chest pain does not go away after treatment. °· You have a rash with blisters on your chest. °· You have a fever. °SEEK IMMEDIATE MEDICAL CARE IF:  °· Your chest pain is worse. °· You have an increasing cough, or you cough up blood. °· You have severe abdominal pain. °· You have severe weakness. °· You faint. °· You have chills. °· You have sudden, unexplained chest discomfort. °· You have sudden, unexplained discomfort in your arms, back, neck, or jaw. °· You have shortness of breath at any time. °· You suddenly start to sweat, or your skin gets clammy. °· You feel nauseous or you vomit. °· You  suddenly feel light-headed or dizzy. °· Your heart begins to beat quickly, or it feels like it is skipping beats. °These symptoms may represent a serious problem that is an emergency. Do not wait to see if the symptoms will go away. Get medical help right away. Call your local emergency services (911 in the [place] Do not drive yourself to the hospital. °  °This information is not intended to replace advice given to you by your health care provider. Make sure you discuss any questions you have with your health care provider. °  °Document Released: 10/16/2004 Document Revised: 01/27/2014 Document Reviewed: 08/12/2013 °Elsevier Interactive Patient Education ©2016 [employer] Inc. ° °

## 2015-04-15 NOTE — ED Notes (Signed)
Patient from [facility] with c/o chest pressure since yesterday. Staff performed a CXR which resulted as "mild chf".

## 2015-04-16 ENCOUNTER — Telehealth: Payer: Self-pay

## 2015-04-16 NOTE — Telephone Encounter (Signed)
Per chart review tab pt seen [facility] ED on 04/15/15.

## 2015-04-16 NOTE — Telephone Encounter (Signed)
PLEASE NOTE: All timestamps contained within this report are represented as [place] Standard Time. CONFIDENTIALTY NOTICE: This fax transmission is intended only for the addressee. It contains information that is legally privileged, confidential or otherwise protected from use or disclosure. If you are not the intended recipient, you are strictly prohibited from reviewing, disclosing, copying using or disseminating any of this information or taking any action in reliance on or regarding this information. If you have received this fax in error, please notify us immediately by telephone so that we can arrange for its return to [place]. Phone: [phone], Toll-Free: [phone], Fax: [phone] Page: 1 of 1 Call Id: [id number] [place] Night - Client Nonclinical Telephone Record [place] Night - Client Client Site [place] - Night Contact Type Call Who Is Calling Physician / Provider / Hospital Call Type Provider Call [facility] Page Now Reason for Call Request to speak to Physician Initial Comment Caller states, [place] [facility] [phone] wants to speak w/ the Dr to see if patient needs to go to the hospital due to Xray changes , Dr Anitra Lauth Additional Comment Patient Name Krista Ellis Patient DOB March 04, 1930 Requesting Provider Teressa Senter w/ [facility] Physician Number [phone] Facility Name [facility] Phone DateTime Result/Outcome Message Type Notes Crissie Sickles [id number] 04/15/2015 2:10:03 PM Paged On Call Back to Call Center Doctor Paged Please call Ashleigh with [facility] at [phone]. Crissie Sickles 04/15/2015 2:17:03 PM Spoke with On Call - General Message Result Call Closed By: Dalia Heading Transaction Date/Time: 04/15/2015 1:59:26 PM (ET)

## 2015-04-16 NOTE — Telephone Encounter (Signed)
ER eval negative Looks okay and feels some better this morning

## 2015-04-16 NOTE — Telephone Encounter (Signed)
PLEASE NOTE: All timestamps contained within this report are represented as [place] Standard Time. CONFIDENTIALTY NOTICE: This fax transmission is intended only for the addressee. It contains information that is legally privileged, confidential or otherwise protected from use or disclosure. If you are not the intended recipient, you are strictly prohibited from reviewing, disclosing, copying using or disseminating any of this information or taking any action in reliance on or regarding this information. If you have received this fax in error, please notify us immediately by telephone so that we can arrange for its return to [place]. Phone: [phone], Toll-Free: [phone], Fax: [phone] Page: 1 of 1 Call Id: [id number] [place] Night - Client Nonclinical Telephone Record [place] Night - Client Client Site [place] Physician Viviana Simpler Contact Type Call Call [place] Page Now Who Is [place] Name [facility] Name [place] Number [phone] Patient Name Krista Ellis Patient DOB 07-14-1930 Reason for Call Symptomatic Patient Initial Comment Caller is Teressa Senter from [place]- [phone] states pt is having chest pain. Nurse transferred over stating she had a chest xray but she cannot find, cannot see any distress, just bronchitis. Additional Comment Paging DoctorName Phone DateTime Result/Outcome Message Type Notes Anitra Lauth [id number] 04/15/2015 10:07:12 AM Paged On Call to Other Provider Doctor Paged Caller is Teressa Senter from [phone] states pt is having chest pain. Nurse transferred over stating she had a chest xray but she cannot find, cannot see any distress, just bronchitis. [place] 04/15/2015 10:07:18 AM Paged On Call to Another Provider Message Result Call Closed  By: Alphonsa Overall Transaction Date/Time: 04/15/2015 9:56:25 AM (ET)

## 2015-04-17 ENCOUNTER — Other Ambulatory Visit: Payer: Self-pay

## 2015-05-18 DIAGNOSIS — I2510 Atherosclerotic heart disease of native coronary artery without angina pectoris: Secondary | ICD-10-CM

## 2015-05-18 DIAGNOSIS — E785 Hyperlipidemia, unspecified: Secondary | ICD-10-CM | POA: Diagnosis not present

## 2015-05-18 DIAGNOSIS — E039 Hypothyroidism, unspecified: Secondary | ICD-10-CM | POA: Diagnosis not present

## 2015-05-18 DIAGNOSIS — M1990 Unspecified osteoarthritis, unspecified site: Secondary | ICD-10-CM | POA: Diagnosis not present

## 2015-05-18 DIAGNOSIS — E43 Unspecified severe protein-calorie malnutrition: Secondary | ICD-10-CM

## 2015-05-18 DIAGNOSIS — I10 Essential (primary) hypertension: Secondary | ICD-10-CM | POA: Diagnosis not present

## 2015-05-18 DIAGNOSIS — G629 Polyneuropathy, unspecified: Secondary | ICD-10-CM

## 2015-05-18 DIAGNOSIS — I739 Peripheral vascular disease, unspecified: Secondary | ICD-10-CM

## 2015-05-28 ENCOUNTER — Telehealth: Payer: Self-pay | Admitting: Urology

## 2015-05-28 ENCOUNTER — Other Ambulatory Visit: Payer: Medicare Other

## 2015-05-28 DIAGNOSIS — N390 Urinary tract infection, site not specified: Secondary | ICD-10-CM

## 2015-05-28 LAB — MICROSCOPIC EXAMINATION: RBC MICROSCOPIC, UA: NONE SEEN /HPF (ref 0–?)

## 2015-05-28 LAB — URINALYSIS, COMPLETE
BILIRUBIN UA: POSITIVE — AB
GLUCOSE, UA: NEGATIVE
NITRITE UA: NEGATIVE
SPEC GRAV UA: 1.015 (ref 1.005–1.030)
UUROB: 0.2 mg/dL (ref 0.2–1.0)
pH, UA: 6 (ref 5.0–7.5)

## 2015-05-28 NOTE — Telephone Encounter (Signed)
Pt's daughter dropped off urine that has been refrigerated and wants to know if pt can get more samples of [place].  Please advise.

## 2015-05-29 NOTE — Telephone Encounter (Signed)
That would be fine, but we are running low on samples.

## 2015-05-30 LAB — CULTURE, URINE COMPREHENSIVE

## 2015-05-30 MED ORDER — NITROFURANTOIN MONOHYD MACRO 100 MG PO CAPS: 100.0000 mg | ORAL_CAPSULE | Freq: Two times a day (BID) | ORAL | Status: DC

## 2015-05-30 NOTE — Telephone Encounter (Signed)
Per Dr. Pilar Jarvis pt should start macrobid. Medication sent to pharmacy.

## 2015-05-30 NOTE — Telephone Encounter (Signed)
LMOM

## 2015-05-31 ENCOUNTER — Telehealth: Payer: Self-pay

## 2015-05-31 NOTE — Telephone Encounter (Signed)
-----   Message from Nickie Retort, MD sent at 05/31/2015 10:08 AM EDT ----- Please let family know her infection is sensitive to macrobid and to finish that [employer]

## 2015-05-31 NOTE — Telephone Encounter (Signed)
Malachy Mood at [facility] called in reference to pt and abx. Judeth Porch a verbal order for pt to start macrobid bid x7days.

## 2015-05-31 NOTE — Telephone Encounter (Signed)
Spoke with pt in reference to needing an abx. Pt stated that medication has to go through [facility], but pt did not know all of the needed information. Therefore pt is having nurse call [facility] to exchange information.

## 2015-05-31 NOTE — Telephone Encounter (Signed)
Pt facility has been made aware.

## 2015-06-01 ENCOUNTER — Encounter: Payer: Self-pay | Admitting: Emergency Medicine

## 2015-06-01 ENCOUNTER — Emergency Department
Admission: EM | Admit: 2015-06-01 | Discharge: 2015-06-01 | Disposition: A | Discharge status: Home / Self Care | Payer: Medicare Other | Attending: Emergency Medicine | Admitting: Emergency Medicine

## 2015-06-01 ENCOUNTER — Emergency Department: Payer: Medicare Other

## 2015-06-01 DIAGNOSIS — Z8673 Personal history of transient ischemic attack (TIA), and cerebral infarction without residual deficits: Secondary | ICD-10-CM | POA: Insufficient documentation

## 2015-06-01 DIAGNOSIS — R079 Chest pain, unspecified: Secondary | ICD-10-CM | POA: Insufficient documentation

## 2015-06-01 DIAGNOSIS — I252 Old myocardial infarction: Secondary | ICD-10-CM | POA: Insufficient documentation

## 2015-06-01 DIAGNOSIS — I10 Essential (primary) hypertension: Secondary | ICD-10-CM | POA: Insufficient documentation

## 2015-06-01 DIAGNOSIS — R0789 Other chest pain: Secondary | ICD-10-CM | POA: Diagnosis not present

## 2015-06-01 DIAGNOSIS — E785 Hyperlipidemia, unspecified: Secondary | ICD-10-CM | POA: Insufficient documentation

## 2015-06-01 DIAGNOSIS — Z79899 Other long term (current) drug therapy: Secondary | ICD-10-CM | POA: Diagnosis not present

## 2015-06-01 LAB — COMPREHENSIVE METABOLIC PANEL
ALT: 17 U/L (ref 14–54)
AST: 35 U/L (ref 15–41)
Albumin: 3.7 g/dL (ref 3.5–5.0)
Alkaline Phosphatase: 49 U/L (ref 38–126)
Anion gap: 8 (ref 5–15)
BUN: 23 mg/dL — ABNORMAL HIGH (ref 6–20)
CALCIUM: 9.7 mg/dL (ref 8.9–10.3)
CHLORIDE: 106 mmol/L (ref 101–111)
CO2: 24 mmol/L (ref 22–32)
CREATININE: 0.8 mg/dL (ref 0.44–1.00)
GFR calc non Af Amer: >60 mL/min (ref >60)
Glucose, Bld: 121 mg/dL — ABNORMAL HIGH (ref 65–99)
POTASSIUM: 4.1 mmol/L (ref 3.5–5.1)
SODIUM: 138 mmol/L (ref 135–145)
TOTAL PROTEIN: 6.9 g/dL (ref 6.5–8.1)
Total Bilirubin: 0.5 mg/dL (ref 0.3–1.2)

## 2015-06-01 LAB — CBC
HCT: 39.6 % (ref 35.0–47.0)
HEMOGLOBIN: 13.1 g/dL (ref 12.0–16.0)
MCH: 31.2 pg (ref 26.0–34.0)
MCHC: 33.1 g/dL (ref 32.0–36.0)
MCV: 94.3 fL (ref 80.0–100.0)
Platelets: 205 10*3/uL (ref 150–440)
RBC: 4.2 MIL/uL (ref 3.80–5.20)
RDW: 13.1 % (ref 11.5–14.5)
WBC: 17.5 10*3/uL — ABNORMAL HIGH (ref 3.6–11.0)

## 2015-06-01 LAB — TROPONIN I: Troponin I: <0.03 ng/mL (ref <0.031)

## 2015-06-01 MED ORDER — FAMOTIDINE 20 MG PO TABS: 20.0000 mg | ORAL_TABLET | Freq: Two times a day (BID) | ORAL | Status: DC

## 2015-06-01 MED ORDER — IOPAMIDOL (ISOVUE-370) INJECTION 76%
75.0000 mL | Freq: Once | INTRAVENOUS | Status: AC | PRN
  Administered 2015-06-01: 75 mL via INTRAVENOUS

## 2015-06-01 MED ORDER — ACETAMINOPHEN 500 MG PO TABS
ORAL_TABLET | ORAL | Status: AC
  Filled 2015-06-01: qty 2

## 2015-06-01 MED ORDER — NITROGLYCERIN 0.4 MG SL SUBL
0.4000 mg | SUBLINGUAL_TABLET | SUBLINGUAL | Status: DC | PRN
  Administered 2015-06-01 (×2): 0.4 mg via SUBLINGUAL
  Filled 2015-06-01: qty 1

## 2015-06-01 MED ORDER — FAMOTIDINE 20 MG PO TABS
20.0000 mg | ORAL_TABLET | Freq: Once | ORAL | Status: AC
  Administered 2015-06-01: 20 mg via ORAL
  Filled 2015-06-01: qty 1

## 2015-06-01 MED ORDER — ASPIRIN 81 MG PO CHEW
324.0000 mg | CHEWABLE_TABLET | Freq: Once | ORAL | Status: AC
  Administered 2015-06-01: 324 mg via ORAL
  Filled 2015-06-01: qty 4

## 2015-06-01 MED ORDER — SODIUM CHLORIDE 0.9 % IV BOLUS (SEPSIS)
500.0000 mL | Freq: Once | INTRAVENOUS | Status: AC
  Administered 2015-06-01: 500 mL via INTRAVENOUS

## 2015-06-01 MED ORDER — ACETAMINOPHEN 500 MG PO TABS
1000.0000 mg | ORAL_TABLET | Freq: Once | ORAL | Status: AC
  Administered 2015-06-01: 1000 mg via ORAL

## 2015-06-01 NOTE — ED Notes (Signed)
Pt waiting on ems.  Report off to [place]

## 2015-06-01 NOTE — ED Notes (Signed)
MD Jimmye Norman notified of pt's pain

## 2015-06-01 NOTE — ED Notes (Signed)
Family came to pick pt up. Paper work given to family - family will give to facility

## 2015-06-01 NOTE — ED Notes (Signed)
Pt resting   meds given. family with pt.

## 2015-06-01 NOTE — ED Provider Notes (Signed)
[facility] Emergency Department Provider Note        Time seen: ----------------------------------------- 2:11 PM on 06/01/2015 -----------------------------------------    I have reviewed the triage vital signs and the nursing notes.   HISTORY  Chief Complaint Chest Pain    HPI Krista Ellis is a 82 y.o. female who presents ER being brought in from 68 [facility] with chest heaviness that started last night. She had some shortness of breath associated with it, describes it like a truck was sitting on her chest. Patient reports the pain is unrelenting but does not appear to be in any acute distress. She has not taken anything for the pain yet. Denies any recent illness, family states she had a fever recently from chronic UTIs.   Past Medical History  Diagnosis Date  . Hypertension   . Hyperlipidemia   . Arthritis   . Hemihypertrophy   . Myocardial infarction ([place])   . Thyroid disease   . CVA (cerebral infarction) 2003  . Peripheral vascular disease ([place])     s/p stent right leg  . Pulmonary nodule     stable on Chest CT March 2014, Followed at [facility]  . Murmur, cardiac   . Chronic cystitis   . Hematuria   . Bleeding disorder ([place])   . Recurrent UTI   . Gross hematuria   . Urinary frequency   . Dysuria   . Sensory urge incontinence   . History of kidney stones     Patient Active Problem List   Diagnosis Date Noted  . Acid reflux 04/13/2015  . Combined fat and carbohydrate induced hyperlipemia 04/13/2015  . Dysuria 03/01/2015  . Atrophic vaginitis 03/01/2015  . Microscopic hematuria 03/01/2015  . Bladder spasms 03/01/2015  . Pulmonary embolism ([place]) 09/22/2014  . UTI (urinary tract infection) 09/14/2014  . Enterococcus UTI 09/14/2014  . Generalized weakness 09/14/2014  . Sepsis ([place]) 09/10/2014  . Open wound of knee, leg (except thigh), and ankle, complicated [id number]  . Buttock pain 07/19/2014  . Hospital discharge follow-up  07/04/2014  . Facial droop   . Malnutrition of moderate degree ([place]) 05/21/2014  . Seizures ([place])   . TIA (transient ischemic attack) 05/18/2014  . Systolic murmur [id number]  . Gait disturbance 05/15/2014  . Benign essential HTN 05/08/2014  . History of subdural hematoma   . Subdural hematoma ([place]) 05/05/2014  . B12 deficiency 03/09/2014  . Bradycardia 02/24/2014  . Arteriosclerosis of coronary artery 02/23/2014  . Recurrent UTI 01/03/2014  . Bilateral hand numbness 01/03/2014  . Nephrolithiasis 11/18/2013  . Recurrent falls 11/18/2013  . Closed rib fracture 09/25/2013  . Abnormal CT scan, chest 09/01/2013  . Chest wall pain 08/31/2013  . Hematoma and contusion 08/31/2013  . Atypical chest pain 06/17/2013  . Peripheral vascular disease ([place]) 06/17/2013  . Heart valve disease 06/17/2013  . Atherosclerotic peripheral vascular disease ([place]) 06/01/2013  . Bladder wall hemorrhage 12/10/2012  . Dysphagia, pharyngoesophageal phase 12/10/2012  . Malaise and fatigue 12/10/2012  . Frank hematuria 12/02/2012  . Bladder retention 12/02/2012  . Postmenopausal estrogen deficiency 11/30/2012  . Glucose found in urine on examination 11/30/2012  . Post menopausal syndrome 11/30/2012  . Type 2 diabetes mellitus with hyperglycemia ([place]) 11/24/2012  . Lung nodule, solitary 03/12/2012  . Diverticular disease of large intestine 12/25/2011  . 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 Symptoms involving urinary system 12/04/2011  . Medicare annual wellness visit, subsequent 10/01/2011  . Dyslipidemia 10/01/2011  . HLD (hyperlipidemia)  10/01/2011  . Encounter for general adult medical examination without abnormal findings 10/01/2011  . Bladder infection, chronic 09/29/2011  . Incomplete bladder emptying 09/29/2011  . Urge incontinence of urine 09/29/2011  . Delayed onset of urination 09/29/2011  . Hypertension 10/17/2010  . Hypothyroidism 10/17/2010  . Osteoarthritis  10/17/2010  . Essential (primary) hypertension 10/17/2010  . Arthritis, degenerative 10/17/2010  . CN (constipation) 05/02/2010    Past Surgical History  Procedure Laterality Date  . Abdominal hysterectomy  1982  . Total hip arthroplasty      right  . Spine surgery      steel rod in back  . Tonsillectomy    . Left rotator cuff      repair  . Total knee arthroplasty Left   . Hip surgery  2007    replaced ball in right hip  . Cardiac surgery      2 failed stents in right leg  . Stent placement leg Right 2015  . Carpal tunnel release    . Appendectomy    . Ruptured disc      Allergies Sulfa antibiotics; Ciprofloxacin; Levofloxacin; and Penicillins  Social History Social History  Substance Use Topics  . Smoking status: Never Smoker   . Smokeless tobacco: Never Used  . Alcohol Use: No    Review of Systems Constitutional: Negative for fever. Eyes: Negative for visual changes. ENT: Negative for sore throat. Cardiovascular: Positive for chest pain Respiratory: Negative for shortness of breath. Gastrointestinal: Negative for abdominal pain, vomiting and diarrhea. Genitourinary: Negative for dysuria. Musculoskeletal: Negative for back pain. Skin: Negative for rash. Neurological: Negative for headaches, focal weakness or numbness.  10-point ROS otherwise negative.  ____________________________________________   PHYSICAL EXAM:  VITAL SIGNS: ED Triage Vitals  Enc Vitals Group     BP 06/01/15 1345 120/52 mmHg     Pulse Rate 06/01/15 1345 75     Resp 06/01/15 1345 20     Temp 06/01/15 1345 97 F (36.1 C)     Temp Source 06/01/15 1345 Oral     SpO2 06/01/15 1345 94 %     Weight 06/01/15 1345 128 lb (58.06 kg)     Height 06/01/15 1345 5\' 1"  (1.549 m)     Head Cir --      Peak Flow --      Pain Score 06/01/15 1345 5     Pain Loc --      Pain Edu? --      Excl. in [place]? --    Constitutional: Alert and oriented. Well appearing and in no distress. Eyes:  Conjunctivae are normal. PERRL. Normal extraocular movements. ENT   Head: Normocephalic and atraumatic.   Nose: No congestion/rhinnorhea.   Mouth/Throat: Mucous membranes are moist.   Neck: No stridor. Cardiovascular: Normal rate, regular rhythm. No murmurs, rubs, or gallops. Respiratory: Normal respiratory effort without tachypnea nor retractions. Breath sounds are clear and equal bilaterally. No wheezes/rales/rhonchi. Gastrointestinal: Soft and nontender. Normal bowel sounds Musculoskeletal: Nontender with normal range of motion in all extremities. No lower extremity tenderness nor edema. Neurologic:  Normal speech and language. No gross focal neurologic deficits are appreciated.  Skin:  Skin is warm, dry and intact. No rash noted. Psychiatric: Mood and affect are normal. Speech and behavior are normal.  ____________________________________________  EKG: Interpreted by me. Sinus rhythm rate of 70 bpm, normal PR interval, normal QRS, normal QT interval. Normal axis.  ____________________________________________  ED COURSE:  Pertinent labs & imaging results that were available during my  care of the patient were reviewed by me and considered in my medical decision making (see chart for details). Patient resents ER with chest pain, will check cardiac labs, give aspirin and nitroglycerin. ____________________________________________    LABS (pertinent positives/negatives)  Labs Reviewed  CBC - Abnormal; Notable for the following:    WBC 17.5 (*)    All other components within normal limits  COMPREHENSIVE METABOLIC PANEL - Abnormal; Notable for the following:    Glucose, Bld 121 (*)    BUN 23 (*)    All other components within normal limits  TROPONIN I  TROPONIN I    RADIOLOGY Images were viewed by me  Chest x-ray IMPRESSION: 1. No evidence of pulmonary embolism. 2. Multiple small pulmonary nodules scattered throughout the lungs bilaterally, similar in size  number and distribution to remote prior examinations dating back to 2015, considered benign. 3. Multiple borderline enlarged and minimally enlarged mediastinal and hilar lymph nodes, also similar prior studies, presumably benign. 4. Atherosclerosis, including right coronary artery disease. Please note that although the presence of coronary artery calcium documents the presence of coronary artery disease, the severity of this disease and any potential stenosis cannot be assessed on this non-gated CT examination. Assessment for potential risk factor modification, dietary therapy or pharmacologic therapy may be warranted, if clinically indicated. 5. There are calcifications of the aortic valve. Echocardiographic correlation for evaluation of potential valvular dysfunction may be warranted if clinically indicated. ____________________________________________  FINAL ASSESSMENT AND PLAN  Chest pain  Plan: Patient with labs and imaging as dictated above. Patient is in no acute distress, serial troponins have been negative, CT angiogram as dictated above. I did offer admission but she would prefer to go home. I will advise close outpatient follow-up with cardiology.   Earleen Newport, MD   Note: This dictation was prepared with Dragon dictation. Any transcriptional errors that result from this process are unintentional   Earleen Newport, MD 06/01/15 1723

## 2015-06-01 NOTE — Discharge Instructions (Signed)
Nonspecific Chest Pain  °Chest pain can be caused by many different conditions. There is always a chance that your pain could be related to something serious, such as a heart attack or a blood clot in your lungs. Chest pain can also be caused by conditions that are not life-threatening. If you have chest pain, it is very important to follow up with your health care provider. °CAUSES  °Chest pain can be caused by: °· Heartburn. °· Pneumonia or bronchitis. °· Anxiety or stress. °· Inflammation around your heart (pericarditis) or lung (pleuritis or pleurisy). °· A blood clot in your lung. °· A collapsed lung (pneumothorax). It can develop suddenly on its own (spontaneous pneumothorax) or from trauma to the chest. °· Shingles infection (varicella-zoster virus). °· Heart attack. °· Damage to the bones, muscles, and cartilage that make up your chest wall. This can include: °¨ Bruised bones due to injury. °¨ Strained muscles or cartilage due to frequent or repeated coughing or overwork. °¨ Fracture to one or more ribs. °¨ Sore cartilage due to inflammation (costochondritis). °RISK FACTORS  °Risk factors for chest pain may include: °· Activities that increase your risk for trauma or injury to your chest. °· Respiratory infections or conditions that cause frequent coughing. °· Medical conditions or overeating that can cause heartburn. °· Heart disease or family history of heart disease. °· Conditions or health behaviors that increase your risk of developing a blood clot. °· Having had chicken pox (varicella zoster). °SIGNS AND SYMPTOMS °Chest pain can feel like: °· Burning or tingling on the surface of your chest or deep in your chest. °· Crushing, pressure, aching, or squeezing pain. °· Dull or sharp pain that is worse when you move, cough, or take a deep breath. °· Pain that is also felt in your back, neck, shoulder, or arm, or pain that spreads to any of these areas. °Your chest pain may come and go, or it may stay  constant. °DIAGNOSIS °Lab tests or other studies may be needed to find the cause of your pain. Your health care provider may have you take a test called an ambulatory ECG (electrocardiogram). An ECG records your heartbeat patterns at the time the test is performed. You may also have other tests, such as: °· Transthoracic echocardiogram (TTE). During echocardiography, sound waves are used to create a picture of all of the heart structures and to look at how blood flows through your heart. °· Transesophageal echocardiogram (TEE). This is a more advanced imaging test that obtains images from inside your body. It allows your health care provider to see your heart in finer detail. °· Cardiac monitoring. This allows your health care provider to monitor your heart rate and rhythm in real time. °· Holter monitor. This is a portable device that records your heartbeat and can help to diagnose abnormal heartbeats. It allows your health care provider to track your heart activity for several days, if needed. °· Stress tests. These can be done through exercise or by taking medicine that makes your heart beat more quickly. °· Blood tests. °· Imaging tests. °TREATMENT  °Your treatment depends on what is causing your chest pain. Treatment may include: °· Medicines. These may include: °¨ Acid blockers for heartburn. °¨ Anti-inflammatory medicine. °¨ Pain medicine for inflammatory conditions. °¨ Antibiotic medicine, if an infection is present. °¨ Medicines to dissolve blood clots. °¨ Medicines to treat coronary artery disease. °· Supportive care for conditions that do not require medicines. This may include: °¨ Resting. °¨ Applying heat   or cold packs to injured areas. °¨ Limiting activities until pain decreases. °HOME CARE INSTRUCTIONS °· If you were prescribed an antibiotic medicine, finish it all even if you start to feel better. °· Avoid any activities that bring on chest pain. °· Do not use any tobacco products, including  cigarettes, chewing tobacco, or electronic cigarettes. If you need help quitting, ask your health care provider. °· Do not drink alcohol. °· Take medicines only as directed by your health care provider. °· Keep all follow-up visits as directed by your health care provider. This is important. This includes any further testing if your chest pain does not go away. °· If heartburn is the cause for your chest pain, you may be told to keep your head raised (elevated) while sleeping. This reduces the chance that acid will go from your stomach into your esophagus. °· Make lifestyle changes as directed by your health care provider. These may include: °¨ Getting regular exercise. Ask your health care provider to suggest some activities that are safe for you. °¨ Eating a heart-healthy diet. A registered dietitian can help you to learn healthy eating options. °¨ Maintaining a healthy weight. °¨ Managing diabetes, if necessary. °¨ Reducing stress. °SEEK MEDICAL CARE IF: °· Your chest pain does not go away after treatment. °· You have a rash with blisters on your chest. °· You have a fever. °SEEK IMMEDIATE MEDICAL CARE IF:  °· Your chest pain is worse. °· You have an increasing cough, or you cough up blood. °· You have severe abdominal pain. °· You have severe weakness. °· You faint. °· You have chills. °· You have sudden, unexplained chest discomfort. °· You have sudden, unexplained discomfort in your arms, back, neck, or jaw. °· You have shortness of breath at any time. °· You suddenly start to sweat, or your skin gets clammy. °· You feel nauseous or you vomit. °· You suddenly feel light-headed or dizzy. °· Your heart begins to beat quickly, or it feels like it is skipping beats. °These symptoms may represent a serious problem that is an emergency. Do not wait to see if the symptoms will go away. Get medical help right away. Call your local emergency services (911 in the [place] Do not drive yourself to the hospital. °  °This  information is not intended to replace advice given to you by your health care provider. Make sure you discuss any questions you have with your health care provider. °  °Document Released: 10/16/2004 Document Revised: 01/27/2014 Document Reviewed: 08/12/2013 °Elsevier Interactive Patient Education ©2016 [employer]. ° °

## 2015-06-01 NOTE — ED Notes (Signed)
Resumed care from [place] rn.   Pt alert.  Family with pt.  Dr Jimmye Norman in w ith pt now.  nsr on monitor.

## 2015-06-01 NOTE — ED Notes (Signed)
PT UNABLE TO SIGN E SIGNATURE.  D/C INST GIVEN TO EMS

## 2015-06-01 NOTE — ED Notes (Signed)
Pt to ed via ems from [facility] with c/o chest pain/heaviness that started last night constant since last night.  Pt states "it feels like a mack truck is sitting on my chest".  Pt alert and oriented, reports pain is unrelenting.  Upon arrival to er pt placed on cm and ekg done.  Pt vss at this time. Iv started and labs drawn and sent.

## 2015-06-04 ENCOUNTER — Telehealth: Payer: Self-pay

## 2015-06-04 DIAGNOSIS — R05 Cough: Secondary | ICD-10-CM | POA: Diagnosis not present

## 2015-06-04 DIAGNOSIS — R509 Fever, unspecified: Secondary | ICD-10-CM | POA: Diagnosis not present

## 2015-06-04 DIAGNOSIS — J181 Lobar pneumonia, unspecified organism: Secondary | ICD-10-CM | POA: Diagnosis not present

## 2015-06-04 NOTE — Telephone Encounter (Signed)
Pt daughter called stating Friday night pt develop a 101 fever, vomiting, and complete body aches. Daughter stated that Saturday pt felt very weak but did not vomit or fever but Sunday all of the symptoms returned. Per daughter pt was given IM injection of abx and is being treated for possible pneumonia. Daughter requested [place] be made aware.

## 2015-06-07 DIAGNOSIS — R2689 Other abnormalities of gait and mobility: Secondary | ICD-10-CM | POA: Diagnosis not present

## 2015-06-07 DIAGNOSIS — M6281 Muscle weakness (generalized): Secondary | ICD-10-CM | POA: Diagnosis not present

## 2015-06-08 DIAGNOSIS — M6281 Muscle weakness (generalized): Secondary | ICD-10-CM | POA: Diagnosis not present

## 2015-06-08 DIAGNOSIS — R2689 Other abnormalities of gait and mobility: Secondary | ICD-10-CM | POA: Diagnosis not present

## 2015-06-11 DIAGNOSIS — R2689 Other abnormalities of gait and mobility: Secondary | ICD-10-CM | POA: Diagnosis not present

## 2015-06-11 DIAGNOSIS — M6281 Muscle weakness (generalized): Secondary | ICD-10-CM | POA: Diagnosis not present

## 2015-06-12 DIAGNOSIS — R2689 Other abnormalities of gait and mobility: Secondary | ICD-10-CM | POA: Diagnosis not present

## 2015-06-12 DIAGNOSIS — M6281 Muscle weakness (generalized): Secondary | ICD-10-CM | POA: Diagnosis not present

## 2015-06-14 DIAGNOSIS — R2689 Other abnormalities of gait and mobility: Secondary | ICD-10-CM | POA: Diagnosis not present

## 2015-06-14 DIAGNOSIS — M6281 Muscle weakness (generalized): Secondary | ICD-10-CM | POA: Diagnosis not present

## 2015-06-18 DIAGNOSIS — R2689 Other abnormalities of gait and mobility: Secondary | ICD-10-CM | POA: Diagnosis not present

## 2015-06-18 DIAGNOSIS — M6281 Muscle weakness (generalized): Secondary | ICD-10-CM | POA: Diagnosis not present

## 2015-06-19 DIAGNOSIS — M6281 Muscle weakness (generalized): Secondary | ICD-10-CM | POA: Diagnosis not present

## 2015-06-19 DIAGNOSIS — R2689 Other abnormalities of gait and mobility: Secondary | ICD-10-CM | POA: Diagnosis not present

## 2015-06-20 DIAGNOSIS — M6281 Muscle weakness (generalized): Secondary | ICD-10-CM | POA: Diagnosis not present

## 2015-06-20 DIAGNOSIS — R2689 Other abnormalities of gait and mobility: Secondary | ICD-10-CM | POA: Diagnosis not present

## 2015-06-21 DIAGNOSIS — M6281 Muscle weakness (generalized): Secondary | ICD-10-CM | POA: Diagnosis not present

## 2015-06-21 DIAGNOSIS — R2689 Other abnormalities of gait and mobility: Secondary | ICD-10-CM | POA: Diagnosis not present

## 2015-06-21 DIAGNOSIS — M25512 Pain in left shoulder: Secondary | ICD-10-CM | POA: Diagnosis not present

## 2015-06-22 DIAGNOSIS — M6281 Muscle weakness (generalized): Secondary | ICD-10-CM | POA: Diagnosis not present

## 2015-06-22 DIAGNOSIS — M25512 Pain in left shoulder: Secondary | ICD-10-CM | POA: Diagnosis not present

## 2015-06-22 DIAGNOSIS — R2689 Other abnormalities of gait and mobility: Secondary | ICD-10-CM | POA: Diagnosis not present

## 2015-06-25 DIAGNOSIS — R2689 Other abnormalities of gait and mobility: Secondary | ICD-10-CM | POA: Diagnosis not present

## 2015-06-25 DIAGNOSIS — M6281 Muscle weakness (generalized): Secondary | ICD-10-CM | POA: Diagnosis not present

## 2015-06-25 DIAGNOSIS — M25512 Pain in left shoulder: Secondary | ICD-10-CM | POA: Diagnosis not present

## 2015-06-26 DIAGNOSIS — R2689 Other abnormalities of gait and mobility: Secondary | ICD-10-CM | POA: Diagnosis not present

## 2015-06-26 DIAGNOSIS — M6281 Muscle weakness (generalized): Secondary | ICD-10-CM | POA: Diagnosis not present

## 2015-06-26 DIAGNOSIS — M25512 Pain in left shoulder: Secondary | ICD-10-CM | POA: Diagnosis not present

## 2015-06-27 DIAGNOSIS — M25512 Pain in left shoulder: Secondary | ICD-10-CM | POA: Diagnosis not present

## 2015-06-27 DIAGNOSIS — M6281 Muscle weakness (generalized): Secondary | ICD-10-CM | POA: Diagnosis not present

## 2015-06-27 DIAGNOSIS — R2689 Other abnormalities of gait and mobility: Secondary | ICD-10-CM | POA: Diagnosis not present

## 2015-06-28 DIAGNOSIS — M6281 Muscle weakness (generalized): Secondary | ICD-10-CM | POA: Diagnosis not present

## 2015-06-28 DIAGNOSIS — M25512 Pain in left shoulder: Secondary | ICD-10-CM | POA: Diagnosis not present

## 2015-06-28 DIAGNOSIS — R2689 Other abnormalities of gait and mobility: Secondary | ICD-10-CM | POA: Diagnosis not present

## 2015-06-29 DIAGNOSIS — M6281 Muscle weakness (generalized): Secondary | ICD-10-CM | POA: Diagnosis not present

## 2015-06-29 DIAGNOSIS — M25512 Pain in left shoulder: Secondary | ICD-10-CM | POA: Diagnosis not present

## 2015-06-29 DIAGNOSIS — R2689 Other abnormalities of gait and mobility: Secondary | ICD-10-CM | POA: Diagnosis not present

## 2015-07-01 DIAGNOSIS — R2689 Other abnormalities of gait and mobility: Secondary | ICD-10-CM | POA: Diagnosis not present

## 2015-07-01 DIAGNOSIS — M6281 Muscle weakness (generalized): Secondary | ICD-10-CM | POA: Diagnosis not present

## 2015-07-01 DIAGNOSIS — M25512 Pain in left shoulder: Secondary | ICD-10-CM | POA: Diagnosis not present

## 2015-07-02 DIAGNOSIS — R2689 Other abnormalities of gait and mobility: Secondary | ICD-10-CM | POA: Diagnosis not present

## 2015-07-02 DIAGNOSIS — M6281 Muscle weakness (generalized): Secondary | ICD-10-CM | POA: Diagnosis not present

## 2015-07-02 DIAGNOSIS — M25512 Pain in left shoulder: Secondary | ICD-10-CM | POA: Diagnosis not present

## 2015-07-03 DIAGNOSIS — M25512 Pain in left shoulder: Secondary | ICD-10-CM | POA: Diagnosis not present

## 2015-07-03 DIAGNOSIS — M6281 Muscle weakness (generalized): Secondary | ICD-10-CM | POA: Diagnosis not present

## 2015-07-03 DIAGNOSIS — R2689 Other abnormalities of gait and mobility: Secondary | ICD-10-CM | POA: Diagnosis not present

## 2015-07-04 DIAGNOSIS — R2689 Other abnormalities of gait and mobility: Secondary | ICD-10-CM | POA: Diagnosis not present

## 2015-07-04 DIAGNOSIS — M6281 Muscle weakness (generalized): Secondary | ICD-10-CM | POA: Diagnosis not present

## 2015-07-04 DIAGNOSIS — M25512 Pain in left shoulder: Secondary | ICD-10-CM | POA: Diagnosis not present

## 2015-07-05 DIAGNOSIS — M25512 Pain in left shoulder: Secondary | ICD-10-CM | POA: Diagnosis not present

## 2015-07-05 DIAGNOSIS — M6281 Muscle weakness (generalized): Secondary | ICD-10-CM | POA: Diagnosis not present

## 2015-07-05 DIAGNOSIS — R2689 Other abnormalities of gait and mobility: Secondary | ICD-10-CM | POA: Diagnosis not present

## 2015-07-09 DIAGNOSIS — M6281 Muscle weakness (generalized): Secondary | ICD-10-CM | POA: Diagnosis not present

## 2015-07-09 DIAGNOSIS — M25512 Pain in left shoulder: Secondary | ICD-10-CM | POA: Diagnosis not present

## 2015-07-09 DIAGNOSIS — R2689 Other abnormalities of gait and mobility: Secondary | ICD-10-CM | POA: Diagnosis not present

## 2015-07-10 DIAGNOSIS — M6281 Muscle weakness (generalized): Secondary | ICD-10-CM | POA: Diagnosis not present

## 2015-07-10 DIAGNOSIS — M25512 Pain in left shoulder: Secondary | ICD-10-CM | POA: Diagnosis not present

## 2015-07-10 DIAGNOSIS — R2689 Other abnormalities of gait and mobility: Secondary | ICD-10-CM | POA: Diagnosis not present

## 2015-07-11 DIAGNOSIS — R2689 Other abnormalities of gait and mobility: Secondary | ICD-10-CM | POA: Diagnosis not present

## 2015-07-11 DIAGNOSIS — M25512 Pain in left shoulder: Secondary | ICD-10-CM | POA: Diagnosis not present

## 2015-07-11 DIAGNOSIS — M6281 Muscle weakness (generalized): Secondary | ICD-10-CM | POA: Diagnosis not present

## 2015-07-12 DIAGNOSIS — I700 Atherosclerosis of aorta: Secondary | ICD-10-CM | POA: Diagnosis not present

## 2015-07-12 DIAGNOSIS — I6523 Occlusion and stenosis of bilateral carotid arteries: Secondary | ICD-10-CM | POA: Diagnosis not present

## 2015-07-12 DIAGNOSIS — I739 Peripheral vascular disease, unspecified: Secondary | ICD-10-CM | POA: Diagnosis not present

## 2015-07-12 DIAGNOSIS — R2689 Other abnormalities of gait and mobility: Secondary | ICD-10-CM | POA: Diagnosis not present

## 2015-07-12 DIAGNOSIS — G909 Disorder of the autonomic nervous system, unspecified: Secondary | ICD-10-CM

## 2015-07-12 DIAGNOSIS — M6281 Muscle weakness (generalized): Secondary | ICD-10-CM | POA: Diagnosis not present

## 2015-07-12 DIAGNOSIS — M25512 Pain in left shoulder: Secondary | ICD-10-CM | POA: Diagnosis not present

## 2015-07-12 DIAGNOSIS — E441 Mild protein-calorie malnutrition: Secondary | ICD-10-CM | POA: Diagnosis not present

## 2015-07-13 ENCOUNTER — Telehealth: Payer: Self-pay | Admitting: Urology

## 2015-07-13 DIAGNOSIS — R2689 Other abnormalities of gait and mobility: Secondary | ICD-10-CM | POA: Diagnosis not present

## 2015-07-13 DIAGNOSIS — M25512 Pain in left shoulder: Secondary | ICD-10-CM | POA: Diagnosis not present

## 2015-07-13 DIAGNOSIS — M6281 Muscle weakness (generalized): Secondary | ICD-10-CM | POA: Diagnosis not present

## 2015-07-13 NOTE — Telephone Encounter (Signed)
Dr. Alla German nurse called and he wants to put patient on Macobid 100mg  daily for preventative for recurrent UTI.  Is this okay with you?

## 2015-07-15 NOTE — Telephone Encounter (Signed)
She is already on estrogen cream for atrophic vaginitis/recurrent uti prevention. I only see two positive urine cultures in last six months with the last one in early May. If she has had multiple symptomatic culture proven UTIs since May with cultures that are not in epic, then daily macrobid 100 mg is reasonable. If she is only having dysuria in the absence of culture proven UTIs, she should continue uribel. In this case macrobid should not be given as it will not help the dysuria but will promote unnecessary bacterial resistance.

## 2015-07-16 ENCOUNTER — Ambulatory Visit: Payer: Self-pay | Admitting: Urology

## 2015-07-16 DIAGNOSIS — M25512 Pain in left shoulder: Secondary | ICD-10-CM | POA: Diagnosis not present

## 2015-07-16 DIAGNOSIS — R2689 Other abnormalities of gait and mobility: Secondary | ICD-10-CM | POA: Diagnosis not present

## 2015-07-16 DIAGNOSIS — M6281 Muscle weakness (generalized): Secondary | ICD-10-CM | POA: Diagnosis not present

## 2015-07-23 DIAGNOSIS — M25551 Pain in right hip: Secondary | ICD-10-CM | POA: Diagnosis not present

## 2015-07-23 DIAGNOSIS — M79671 Pain in right foot: Secondary | ICD-10-CM | POA: Diagnosis not present

## 2015-07-23 DIAGNOSIS — M546 Pain in thoracic spine: Secondary | ICD-10-CM | POA: Diagnosis not present

## 2015-07-23 DIAGNOSIS — M79604 Pain in right leg: Secondary | ICD-10-CM | POA: Diagnosis not present

## 2015-07-23 DIAGNOSIS — M542 Cervicalgia: Secondary | ICD-10-CM | POA: Diagnosis not present

## 2015-07-23 DIAGNOSIS — R296 Repeated falls: Secondary | ICD-10-CM | POA: Diagnosis not present

## 2015-08-20 DIAGNOSIS — R011 Cardiac murmur, unspecified: Secondary | ICD-10-CM | POA: Diagnosis not present

## 2015-08-20 DIAGNOSIS — E079 Disorder of thyroid, unspecified: Secondary | ICD-10-CM | POA: Diagnosis not present

## 2015-08-31 DIAGNOSIS — Z86711 Personal history of pulmonary embolism: Secondary | ICD-10-CM

## 2015-08-31 DIAGNOSIS — I10 Essential (primary) hypertension: Secondary | ICD-10-CM | POA: Diagnosis not present

## 2015-08-31 DIAGNOSIS — E785 Hyperlipidemia, unspecified: Secondary | ICD-10-CM | POA: Diagnosis not present

## 2015-08-31 DIAGNOSIS — E039 Hypothyroidism, unspecified: Secondary | ICD-10-CM | POA: Diagnosis not present

## 2015-08-31 DIAGNOSIS — I6523 Occlusion and stenosis of bilateral carotid arteries: Secondary | ICD-10-CM

## 2015-08-31 DIAGNOSIS — M1990 Unspecified osteoarthritis, unspecified site: Secondary | ICD-10-CM | POA: Diagnosis not present

## 2015-08-31 DIAGNOSIS — G909 Disorder of the autonomic nervous system, unspecified: Secondary | ICD-10-CM

## 2015-08-31 DIAGNOSIS — I739 Peripheral vascular disease, unspecified: Secondary | ICD-10-CM

## 2015-08-31 DIAGNOSIS — Z87440 Personal history of urinary (tract) infections: Secondary | ICD-10-CM

## 2015-09-10 DIAGNOSIS — L989 Disorder of the skin and subcutaneous tissue, unspecified: Secondary | ICD-10-CM | POA: Diagnosis not present

## 2015-09-10 DIAGNOSIS — I872 Venous insufficiency (chronic) (peripheral): Secondary | ICD-10-CM | POA: Diagnosis not present

## 2015-09-28 DIAGNOSIS — M7981 Nontraumatic hematoma of soft tissue: Secondary | ICD-10-CM | POA: Diagnosis not present

## 2015-10-15 ENCOUNTER — Telehealth: Payer: Self-pay | Admitting: Internal Medicine

## 2015-10-15 NOTE — Telephone Encounter (Signed)
LVM for pt to call back. She has appt with [facility] on 12/20/2015 at 3:15pm with Dr. Nehemiah Massed.  I need to know if she is aware of this appt. [place] faxed [place] information about appt -arc

## 2015-10-22 DIAGNOSIS — Z23 Encounter for immunization: Secondary | ICD-10-CM | POA: Diagnosis not present

## 2015-10-22 NOTE — Telephone Encounter (Signed)
Spoke with pt. She doesn't remember seeing Webb Silversmith at [facility] and her ordering dermatology referral for skin lesion on Left side of face. Pt did say that the spot on her face is gone and doesn't think she needs the appt with derm. I asked pt if it was okay for me to contact pt's daughter. Pt okay'd this request.  LVM for daughter Blima Dessert to call back.

## 2015-10-30 NOTE — Telephone Encounter (Signed)
10/10-Spoke to pt daughter Ronald Pippins. She confirmed that the spot on side of pt's face has disappeared and it was ok to cancel the appt on 11/30 with Dr. Nehemiah Massed at [facility].  Cancelled appt with Claiborne Billings at [facility]. -arc

## 2015-11-08 DIAGNOSIS — I6529 Occlusion and stenosis of unspecified carotid artery: Secondary | ICD-10-CM | POA: Diagnosis not present

## 2015-11-08 DIAGNOSIS — I739 Peripheral vascular disease, unspecified: Secondary | ICD-10-CM | POA: Diagnosis not present

## 2015-11-08 DIAGNOSIS — G9009 Other idiopathic peripheral autonomic neuropathy: Secondary | ICD-10-CM

## 2015-11-08 DIAGNOSIS — E441 Mild protein-calorie malnutrition: Secondary | ICD-10-CM | POA: Diagnosis not present

## 2015-11-08 DIAGNOSIS — N390 Urinary tract infection, site not specified: Secondary | ICD-10-CM | POA: Diagnosis not present

## 2015-11-19 DIAGNOSIS — N390 Urinary tract infection, site not specified: Secondary | ICD-10-CM | POA: Diagnosis not present

## 2015-11-20 DIAGNOSIS — R309 Painful micturition, unspecified: Secondary | ICD-10-CM | POA: Diagnosis not present

## 2015-12-04 ENCOUNTER — Telehealth: Payer: Self-pay | Admitting: Urology

## 2015-12-04 NOTE — Telephone Encounter (Signed)
Patient is coming in on 12-05-15 to see the nurse for possible UTI. She has burning with urination and bladder pain.   michelle

## 2015-12-05 ENCOUNTER — Ambulatory Visit (INDEPENDENT_AMBULATORY_CARE_PROVIDER_SITE_OTHER): Payer: Medicare Other

## 2015-12-05 VITALS — BP 149/72 | HR 67 | Wt 123.0 lb

## 2015-12-05 DIAGNOSIS — R32 Unspecified urinary incontinence: Secondary | ICD-10-CM | POA: Diagnosis not present

## 2015-12-05 DIAGNOSIS — R300 Dysuria: Secondary | ICD-10-CM

## 2015-12-05 LAB — URINALYSIS, COMPLETE

## 2015-12-05 LAB — MICROSCOPIC EXAMINATION
Epithelial Cells (non renal): NONE SEEN /hpf (ref 0–10)
WBC, UA: >30 /hpf — AB (ref 0–?)

## 2015-12-05 LAB — BLADDER SCAN AMB NON-IMAGING

## 2015-12-05 NOTE — Progress Notes (Signed)
Patient complaining of dysuria, bladder pressure and urinary incontinence. Patient was able to to void and a PVR showed 35ml in her bladder. The urine sample provided was dark orange in color due to use of pyridium. Patient was told to increase water intake and continue pyridium and we would send urine for culture and call with results.

## 2015-12-10 ENCOUNTER — Telehealth: Payer: Self-pay

## 2015-12-10 LAB — CULTURE, URINE COMPREHENSIVE

## 2015-12-10 NOTE — Telephone Encounter (Signed)
Spoke with Malachy Mood, pt nurse at [facility], in reference to +ucx. Gave Cheryl verbal orders. Malachy Mood voiced understanding.

## 2015-12-10 NOTE — Telephone Encounter (Signed)
-----   Message from Nori Riis, PA-C sent at 12/10/2015  8:31 AM EST ----- Patient has a +UCx.  They need to start Macrobid 100 mg,  one capsule twice daily for seven days.  They also need to take a probiotic with the antibiotic course.  The dosage is listed below:  L. acidophilus and L. casei (25 x 109 CFU/day for 2 days, then 50 x 109 CFU/day for duration of the antibiotic course)

## 2015-12-16 DIAGNOSIS — J029 Acute pharyngitis, unspecified: Secondary | ICD-10-CM | POA: Diagnosis not present

## 2015-12-18 DIAGNOSIS — R05 Cough: Secondary | ICD-10-CM | POA: Diagnosis not present

## 2015-12-18 DIAGNOSIS — R0989 Other specified symptoms and signs involving the circulatory and respiratory systems: Secondary | ICD-10-CM | POA: Diagnosis not present

## 2016-01-11 DIAGNOSIS — I6523 Occlusion and stenosis of bilateral carotid arteries: Secondary | ICD-10-CM

## 2016-01-11 DIAGNOSIS — I739 Peripheral vascular disease, unspecified: Secondary | ICD-10-CM | POA: Diagnosis not present

## 2016-01-11 DIAGNOSIS — N390 Urinary tract infection, site not specified: Secondary | ICD-10-CM

## 2016-01-11 DIAGNOSIS — I10 Essential (primary) hypertension: Secondary | ICD-10-CM | POA: Diagnosis not present

## 2016-01-11 DIAGNOSIS — M1990 Unspecified osteoarthritis, unspecified site: Secondary | ICD-10-CM | POA: Diagnosis not present

## 2016-01-11 DIAGNOSIS — G9009 Other idiopathic peripheral autonomic neuropathy: Secondary | ICD-10-CM

## 2016-01-11 DIAGNOSIS — E785 Hyperlipidemia, unspecified: Secondary | ICD-10-CM | POA: Diagnosis not present

## 2016-01-11 DIAGNOSIS — E43 Unspecified severe protein-calorie malnutrition: Secondary | ICD-10-CM

## 2016-01-11 DIAGNOSIS — I350 Nonrheumatic aortic (valve) stenosis: Secondary | ICD-10-CM

## 2016-01-24 ENCOUNTER — Telehealth: Payer: Self-pay | Admitting: Urology

## 2016-01-24 NOTE — Telephone Encounter (Signed)
Malachy Mood, RN from [facility] states she is complaining of intense burning in the vaginal area.  She using the vaginal estrogen cream, on cranberry tablets and Pyridium without relief.  We will start Macrobid 100 mg daily and patient will come in for an office visit for exam to evaluate for other possible causes for her vaginal burning.

## 2016-01-24 NOTE — Telephone Encounter (Signed)
Krista Ellis states pt is having multiple symptoms, the Dr on site has recommendation and wants to run it by urology. Wants to give Profolactic Macrobid. Please call and advise.

## 2016-01-25 ENCOUNTER — Emergency Department: Payer: Medicare Other

## 2016-01-25 ENCOUNTER — Inpatient Hospital Stay
Admission: EM | Admit: 2016-01-25 | Discharge: 2016-01-27 | DRG: 175 | Disposition: A | Discharge status: Skilled Nursing Facility | Payer: Medicare Other | Attending: Internal Medicine | Admitting: Internal Medicine

## 2016-01-25 DIAGNOSIS — Z96649 Presence of unspecified artificial hip joint: Secondary | ICD-10-CM | POA: Diagnosis present

## 2016-01-25 DIAGNOSIS — I252 Old myocardial infarction: Secondary | ICD-10-CM

## 2016-01-25 DIAGNOSIS — I70213 Atherosclerosis of native arteries of extremities with intermittent claudication, bilateral legs: Secondary | ICD-10-CM | POA: Diagnosis not present

## 2016-01-25 DIAGNOSIS — R0789 Other chest pain: Secondary | ICD-10-CM | POA: Diagnosis not present

## 2016-01-25 DIAGNOSIS — Z882 Allergy status to sulfonamides status: Secondary | ICD-10-CM

## 2016-01-25 DIAGNOSIS — Z66 Do not resuscitate: Secondary | ICD-10-CM | POA: Diagnosis present

## 2016-01-25 DIAGNOSIS — Z87442 Personal history of urinary calculi: Secondary | ICD-10-CM

## 2016-01-25 DIAGNOSIS — Z86711 Personal history of pulmonary embolism: Secondary | ICD-10-CM | POA: Diagnosis not present

## 2016-01-25 DIAGNOSIS — Z79899 Other long term (current) drug therapy: Secondary | ICD-10-CM

## 2016-01-25 DIAGNOSIS — R079 Chest pain, unspecified: Secondary | ICD-10-CM

## 2016-01-25 DIAGNOSIS — I2699 Other pulmonary embolism without acute cor pulmonale: Secondary | ICD-10-CM | POA: Diagnosis not present

## 2016-01-25 DIAGNOSIS — Z8249 Family history of ischemic heart disease and other diseases of the circulatory system: Secondary | ICD-10-CM | POA: Diagnosis not present

## 2016-01-25 DIAGNOSIS — Z90710 Acquired absence of both cervix and uterus: Secondary | ICD-10-CM

## 2016-01-25 DIAGNOSIS — N3001 Acute cystitis with hematuria: Secondary | ICD-10-CM | POA: Diagnosis present

## 2016-01-25 DIAGNOSIS — R0602 Shortness of breath: Secondary | ICD-10-CM | POA: Diagnosis not present

## 2016-01-25 DIAGNOSIS — Z880 Allergy status to penicillin: Secondary | ICD-10-CM | POA: Diagnosis not present

## 2016-01-25 DIAGNOSIS — Z0389 Encounter for observation for other suspected diseases and conditions ruled out: Secondary | ICD-10-CM | POA: Diagnosis not present

## 2016-01-25 DIAGNOSIS — Z9049 Acquired absence of other specified parts of digestive tract: Secondary | ICD-10-CM

## 2016-01-25 DIAGNOSIS — Z87440 Personal history of urinary (tract) infections: Secondary | ICD-10-CM | POA: Diagnosis not present

## 2016-01-25 DIAGNOSIS — Z8673 Personal history of transient ischemic attack (TIA), and cerebral infarction without residual deficits: Secondary | ICD-10-CM | POA: Diagnosis not present

## 2016-01-25 DIAGNOSIS — G9340 Encephalopathy, unspecified: Secondary | ICD-10-CM | POA: Diagnosis present

## 2016-01-25 DIAGNOSIS — R0902 Hypoxemia: Secondary | ICD-10-CM | POA: Diagnosis present

## 2016-01-25 DIAGNOSIS — I739 Peripheral vascular disease, unspecified: Secondary | ICD-10-CM | POA: Diagnosis present

## 2016-01-25 DIAGNOSIS — Z888 Allergy status to other drugs, medicaments and biological substances status: Secondary | ICD-10-CM

## 2016-01-25 DIAGNOSIS — Z96652 Presence of left artificial knee joint: Secondary | ICD-10-CM | POA: Diagnosis present

## 2016-01-25 DIAGNOSIS — R350 Frequency of micturition: Secondary | ICD-10-CM | POA: Diagnosis present

## 2016-01-25 DIAGNOSIS — I10 Essential (primary) hypertension: Secondary | ICD-10-CM | POA: Diagnosis not present

## 2016-01-25 DIAGNOSIS — Z743 Need for continuous supervision: Secondary | ICD-10-CM | POA: Diagnosis not present

## 2016-01-25 DIAGNOSIS — E039 Hypothyroidism, unspecified: Secondary | ICD-10-CM | POA: Diagnosis present

## 2016-01-25 DIAGNOSIS — E785 Hyperlipidemia, unspecified: Secondary | ICD-10-CM | POA: Diagnosis present

## 2016-01-25 DIAGNOSIS — N390 Urinary tract infection, site not specified: Secondary | ICD-10-CM | POA: Diagnosis not present

## 2016-01-25 DIAGNOSIS — I2510 Atherosclerotic heart disease of native coronary artery without angina pectoris: Secondary | ICD-10-CM | POA: Diagnosis not present

## 2016-01-25 HISTORY — DX: Systemic involvement of connective tissue, unspecified: M35.9

## 2016-01-25 LAB — URINALYSIS, ROUTINE W REFLEX MICROSCOPIC: Specific Gravity, Urine: 1.027 (ref 1.005–1.030)

## 2016-01-25 LAB — CBC
HCT: 42.9 % (ref 35.0–47.0)
Hemoglobin: 13.9 g/dL (ref 12.0–16.0)
MCH: 31.1 pg (ref 26.0–34.0)
MCHC: 32.3 g/dL (ref 32.0–36.0)
MCV: 96.4 fL (ref 80.0–100.0)
PLATELETS: 222 10*3/uL (ref 150–440)
RBC: 4.45 MIL/uL (ref 3.80–5.20)
RDW: 13.1 % (ref 11.5–14.5)
WBC: 8.9 10*3/uL (ref 3.6–11.0)

## 2016-01-25 LAB — TROPONIN I: Troponin I: <0.03 ng/mL (ref <0.03)

## 2016-01-25 LAB — PROTIME-INR
INR: 0.98
Prothrombin Time: 13 seconds (ref 11.4–15.2)

## 2016-01-25 LAB — COMPREHENSIVE METABOLIC PANEL
ALT: 22 U/L (ref 14–54)
AST: 61 U/L — AB (ref 15–41)
Albumin: 3.9 g/dL (ref 3.5–5.0)
Alkaline Phosphatase: 49 U/L (ref 38–126)
Anion gap: 4 — ABNORMAL LOW (ref 5–15)
BUN: 21 mg/dL — AB (ref 6–20)
CHLORIDE: 107 mmol/L (ref 101–111)
CO2: 26 mmol/L (ref 22–32)
CREATININE: 0.81 mg/dL (ref 0.44–1.00)
Calcium: 9.9 mg/dL (ref 8.9–10.3)
GFR calc Af Amer: >60 mL/min (ref >60)
GFR calc non Af Amer: >60 mL/min (ref >60)
Glucose, Bld: 105 mg/dL — ABNORMAL HIGH (ref 65–99)
Potassium: 3.7 mmol/L (ref 3.5–5.1)
SODIUM: 137 mmol/L (ref 135–145)
Total Bilirubin: 0.6 mg/dL (ref 0.3–1.2)
Total Protein: 7.3 g/dL (ref 6.5–8.1)

## 2016-01-25 LAB — BRAIN NATRIURETIC PEPTIDE: B Natriuretic Peptide: 78 pg/mL (ref 0.0–100.0)

## 2016-01-25 LAB — RAPID INFLUENZA A&B ANTIGENS (ARMC ONLY): INFLUENZA B (ARMC): NEGATIVE

## 2016-01-25 LAB — RAPID INFLUENZA A&B ANTIGENS: Influenza A (ARMC): NEGATIVE

## 2016-01-25 LAB — FIBRIN DERIVATIVES D-DIMER (ARMC ONLY): FIBRIN DERIVATIVES D-DIMER (ARMC): 1691 — AB (ref 0–499)

## 2016-01-25 MED ORDER — IOPAMIDOL (ISOVUE-370) INJECTION 76%
75.0000 mL | Freq: Once | INTRAVENOUS | Status: AC | PRN
Start: 2016-01-25 — End: 2016-01-25
  Administered 2016-01-25: 75 mL via INTRAVENOUS

## 2016-01-25 MED ORDER — ACETAMINOPHEN 500 MG PO TABS
ORAL_TABLET | ORAL | Status: AC
  Administered 2016-01-25: 1000 mg via ORAL
  Filled 2016-01-25: qty 2

## 2016-01-25 MED ORDER — ACETAMINOPHEN 500 MG PO TABS
1000.0000 mg | ORAL_TABLET | Freq: Once | ORAL | Status: AC
  Administered 2016-01-25: 1000 mg via ORAL

## 2016-01-25 MED ORDER — DEXTROSE 5 % IV SOLN: 1.0000 g | Freq: Once | INTRAVENOUS | Status: DC

## 2016-01-25 MED ORDER — CEFTRIAXONE SODIUM-DEXTROSE 1-3.74 GM-% IV SOLR
1.0000 g | Freq: Once | INTRAVENOUS | Status: AC
Start: 2016-01-25 — End: 2016-01-25
  Administered 2016-01-25: 1 g via INTRAVENOUS
  Filled 2016-01-25: qty 50

## 2016-01-25 MED ORDER — ENOXAPARIN SODIUM 60 MG/0.6ML ~~LOC~~ SOLN
55.0000 mg | Freq: Two times a day (BID) | SUBCUTANEOUS | Status: DC
  Administered 2016-01-25 – 2016-01-26 (×2): 55 mg via SUBCUTANEOUS
  Filled 2016-01-25 (×2): qty 0.6

## 2016-01-25 NOTE — ED Notes (Signed)
Patient transported to CT 

## 2016-01-25 NOTE — ED Notes (Signed)
Pt states she has had "athritis and takes tylenol at night." Pt requests tylenol, MD notified.

## 2016-01-25 NOTE — ED Triage Notes (Signed)
Per EMS, pt is a resident at [facility] and today started to have chest pressure, jaw pain as well as SOB. Pt was placed on 2L of oxygen in EMS due to oxygen reading 89%, currently pt is at 91% ON 2L. EMS also reported temp of 101, upon arrival pt's temp is 99.3 oral. Pt A&O and in NAD at this time.

## 2016-01-25 NOTE — H&P (Addendum)
History and Physical   [facility] [place] @ [facility] Admission History and Physical [employer], D.O.    Patient Name: Krista Ellis MR#: [id number] Date of Birth: [birth year] Date of Admission: 01/25/2016  Referring MD/NP/PA: Meade Maw, MD Primary Care Physician: Viviana Simpler, MD Outpatient Specialists: none  Patient coming from: [facility]  Chief Complaint: Chest pain, Shortness of breath  HPI: Krista Ellis is a 83 y.o. female with a known history of HTN, HLD, MI, CVA and h/o PE who presents to the emergency department for evaluation of chest pain and shortness of breath. Patient was in a usual state of health until 9 hours prior to admission when she felt left sided chest tightness and pain with shortness of breath. The pain was sharp and radiated to her left jaw. She was given sublingual nitro which did not resolve the tightness or pain. Patient was sitting at the time of onset. She reports that this was associated with a bout of nausea with dry heaves but no vomiting. Admits fever, chills, and weakness. Denies night sweats, vomiting, abdominal pain, or diarrhea. Denies recent travel, hospitalizations, surgery.    Patient reports recent bouts of hypoxia with exertion over the last week that were not associated with CP. She states she was given oxygen at the SNF for these which resolved the incidents. She does not use oxygen regularly. Additionally patient was suspected to have a UTI by the caregivers at the SNF and started on Clive today. She has chronic UTI and has been experiencing urgency and burning with urination. She is not continent of urine. Patient has been taking medication as prescribed and there has been no recent change in diet. Patient ambulates with a walker under supervision but uses a wheelchair for mobility around the facility.    ED Course: Given Macrobid, Zofran, IV Rocephin, Lovenox, and Acetaminophen.  Review of Systems:   CONSTITUTIONAL: Positive for fever/chills, weakness. No weight gain/loss, headache. EYES: No blurry or double vision. ENT: No tinnitus, postnasal drip, redness or soreness of the oropharynx. RESPIRATORY: Positive for dyspnea. No cough, wheeze, hemoptysis.  CARDIOVASCULAR: Positive for chest pain. No palpitations, syncope, orthopnea,  GASTROINTESTINAL: Positive for nausea. No vomiting, abdominal pain, constipation, diarrhea.  No hematemesis, melena or hematochezia. GENITOURINARY: Positive for dysuria, frequency. No hematuria. ENDOCRINE: No polyuria or nocturia. No heat or cold intolerance. HEMATOLOGY: No anemia, bruising, bleeding. INTEGUMENTARY: No rashes, ulcers, lesions. MUSCULOSKELETAL: No gout, dyspnea.  NEUROLOGIC: No numbness, tingling, ataxia, seizure-type activity, weakness. PSYCHIATRIC: No anxiety, depression, insomnia.   Past Medical History:  Diagnosis Date  . Arthritis   . Bleeding disorder ([place])   . Chronic cystitis   . Collagen vascular disease ([place])   . CVA (cerebral infarction) 2003  . Dysuria   . Gross hematuria   . Hematuria   . Hemihypertrophy   . History of kidney stones   . Hyperlipidemia   . Hypertension   . Murmur, cardiac   . Myocardial infarction   . Peripheral vascular disease ([place])    s/p stent right leg  . Pulmonary nodule    stable on Chest CT March 2014, Followed at [facility]  . Recurrent UTI   . Sensory urge incontinence   . Thyroid disease   . Urinary frequency     Past Surgical History:  Procedure Laterality Date  . ABDOMINAL HYSTERECTOMY  1982  . APPENDECTOMY    . CARDIAC SURGERY     2 failed stents in right leg  . CARPAL TUNNEL  RELEASE    . HIP SURGERY  2007   replaced ball in right hip  . left rotator cuff     repair  . ruptured disc    . SPINE SURGERY     steel rod in back  . stent placement leg Right 2015  . TONSILLECTOMY    . TOTAL HIP ARTHROPLASTY     right  . TOTAL KNEE ARTHROPLASTY Left      reports that she has  never smoked. She has never used smokeless tobacco. She reports that she does not drink alcohol or use drugs.  Allergies  Allergen Reactions  . Sulfa Antibiotics Hives and Itching  . Ciprofloxacin Hives and Itching  . Levofloxacin Hives and Itching  . Penicillins Hives and Itching    Family History  Problem Relation Age of Onset  . Hypertension Mother   . Transient ischemic attack Mother   . Kidney disease Neg Hx   . Bladder Cancer Neg Hx   . Prostate cancer Neg Hx    Family history has been reviewed and confirmed with patient.   Prior to Admission medications   Medication Sig Start Date End Date Taking? Authorizing Provider  acetaminophen (TYLENOL) 325 MG tablet Take 325 mg by mouth every 4 (four) hours as needed for mild pain, moderate pain, fever or headache. Reported on 02/27/2015   Yes Historical Provider, MD  acetaminophen (TYLENOL) 650 MG CR tablet Take 650 mg by mouth 3 (three) times daily. Reported on 04/13/2015   Yes Historical Provider, MD  amLODipine (NORVASC) 5 MG tablet Take 5 mg by mouth daily.   Yes Historical Provider, MD  bifidobacterium infantis (ALIGN) capsule Take 1 capsule by mouth daily.   Yes Historical Provider, MD  bisacodyl (DULCOLAX) 10 MG suppository Place 10 mg rectally daily as needed for moderate constipation.   Yes Historical Provider, MD  clopidogrel (PLAVIX) 75 MG tablet Take 75 mg by mouth daily.  03/15/15  Yes Historical Provider, MD  Cranberry 425 MG CAPS Take 425 mg by mouth daily.   Yes Historical Provider, MD  cyanocobalamin (,VITAMIN B-12,) 1000 MCG/ML injection Inject 1,000 mcg into the muscle every 30 (thirty) days. On the 2nd of each month   Yes Historical Provider, MD  dextromethorphan-guaiFENesin (ROBITUSSIN-DM) 10-100 MG/5ML liquid Take 10 mLs by mouth every 4 (four) hours as needed for cough.   Yes Historical Provider, MD  ergocalciferol (VITAMIN D2) 50000 units capsule Take 50,000 Units by mouth every 30 (thirty) days. Take on the 13th of  every month   Yes Historical Provider, MD  levothyroxine (SYNTHROID, LEVOTHROID) 25 MCG tablet TAKE 1 TABLET (25 MCG TOTAL) BY MOUTH DAILY. Patient taking differently: Take 25 mcg by mouth daily.  07/04/14  Yes Jackolyn Confer, MD  loratadine (CLARITIN) 10 MG tablet Take 1 tablet (10 mg total) by mouth daily. 05/24/14  Yes Maryann Mikhail, DO  magnesium hydroxide (MILK OF MAGNESIA) 400 MG/5ML suspension Take 30 mLs by mouth daily as needed for mild constipation or moderate constipation.   Yes Historical Provider, MD  menthol-cetylpyridinium (CEPACOL) 3 MG lozenge Take 1 lozenge by mouth as needed for sore throat.   Yes Historical Provider, MD  metoprolol succinate (TOPROL-XL) 25 MG 24 hr tablet Take 12.5 mg by mouth daily.    Yes Historical Provider, MD  nitrofurantoin, macrocrystal-monohydrate, (MACROBID) 100 MG capsule Take 100 mg by mouth daily.   Yes Historical Provider, MD  ondansetron (ZOFRAN) 4 MG tablet Take 4 mg by mouth every 8 (eight) hours  as needed for nausea or vomiting.   Yes Historical Provider, MD  oxybutynin (DITROPAN) 5 MG tablet Take 0.5 tablets (2.5 mg total) by mouth 2 (two) times daily. 02/26/15  Yes Loney Hering, MD  phenazopyridine (PYRIDIUM) 100 MG tablet Take 1 tablet (100 mg total) by mouth 3 (three) times daily as needed for pain. 02/26/15  Yes Loney Hering, MD  polyethylene glycol [facility] Floria Raveling) packet Take 17 g by mouth daily as needed for mild constipation, moderate constipation or severe constipation. Reported on 02/27/2015   Yes Historical Provider, MD  pravastatin (PRAVACHOL) 40 MG tablet Take 1 tablet (40 mg total) by mouth every evening. 07/04/14  Yes Jackolyn Confer, MD  senna-docusate (SENOKOT-S) 8.6-50 MG tablet Take 1 tablet by mouth 2 (two) times daily.   Yes Historical Provider, MD  sodium chloride (OCEAN) 0.65 % SOLN nasal spray Place 1 spray into both nostrils every 2 (two) hours as needed for congestion.   Yes Historical Provider, MD  traMADol  (ULTRAM) 50 MG tablet Take 25 mg by mouth every 8 (eight) hours as needed for moderate pain.    Yes Historical Provider, MD  famotidine (PEPCID) 20 MG tablet Take 1 tablet (20 mg total) by mouth 2 (two) times daily. Patient not taking: Reported on 01/25/2016 06/01/15   Earleen Newport, MD    Physical Exam: Vitals:   01/25/16 1559 01/25/16 1807 01/25/16 2000 01/25/16 2200  BP:  (!) 106/49 (!) 110/51 129/60  Pulse:  87 88 96  Resp:  18 (!) 21 (!) 21  Temp:      TempSrc:      SpO2:  95% 92% 94%  Weight: 54.9 kg (121 lb)     Height: 5\' 3"  (1.6 m)       GENERAL: 83 y.o.-year-old caucasian patient, well-developed, well-nourished lying in the bed in no acute distress.  Pleasant and cooperative.   HEENT: Head atraumatic, normocephalic. Pupils equal, round, reactive to light and accommodation. No scleral icterus. Extraocular muscles intact. Nares are patent. Oropharynx is clear. Mucus membranes moist. NECK: Supple, full range of motion. No JVD, no bruit heard. No thyroid enlargement, no tenderness, no cervical lymphadenopathy. CHEST: Bilateral expiratory wheeze. No use of accessory muscles of respiration.  No reproducible chest wall tenderness.  CARDIOVASCULAR: S1, S2 normal. Systolic murmur 2/6. Cap refill <2 seconds. Pulses intact distally.  ABDOMEN: Soft, nondistended, nontender, . No rebound, guarding, rigidity. Normoactive bowel sounds present in all four quadrants. No organomegaly or mass. EXTREMITIES: Left lower extremity larger than right to hip. No pedal edema, cyanosis, or clubbing. Pulses intact, symmetrical. NEUROLOGIC: Cranial nerves II through XII are grossly intact with no focal sensorimotor deficit. Muscle strength 5/5 in all extremities. Sensation intact. Gait not checked. PSYCHIATRIC: The patient is alert and oriented x 3. Normal affect, mood, thought content. SKIN: Warm, dry, and intact without obvious rash, lesion, or ulcer.   Labs on Admission: I have personally reviewed  following labs and imaging studies  CBC:  Recent Labs Lab 01/25/16 1610  WBC 8.9  HGB 13.9  HCT 42.9  MCV 96.4  PLT [id number]   Basic Metabolic Panel:  Recent Labs Lab 01/25/16 1610  NA 137  K 3.7  CL 107  CO2 26  GLUCOSE 105*  BUN 21*  CREATININE 0.81  CALCIUM 9.9   GFR: Estimated Creatinine Clearance: 43.5 mL/min (by C-G formula based on SCr of 0.81 mg/dL). Liver Function Tests:  Recent Labs Lab 01/25/16 1610  AST 61*  ALT  22  ALKPHOS 49  BILITOT 0.6  PROT 7.3  ALBUMIN 3.9   No results for input(s): LIPASE, AMYLASE in the last 168 hours. No results for input(s): AMMONIA in the last 168 hours. Coagulation Profile:  Recent Labs Lab 01/25/16 1610  INR 0.98   Cardiac Enzymes:  Recent Labs Lab 01/25/16 1610 01/25/16 1901  TROPONINI <0.03 <0.03   BNP (last 3 results) No results for input(s): PROBNP in the last 8760 hours. HbA1C: No results for input(s): HGBA1C in the last 72 hours. CBG: No results for input(s): GLUCAP in the last 168 hours. Lipid Profile: No results for input(s): CHOL, HDL, LDLCALC, TRIG, CHOLHDL, LDLDIRECT in the last 72 hours. Thyroid Function Tests: No results for input(s): TSH, T4TOTAL, FREET4, T3FREE, THYROIDAB in the last 72 hours. Anemia Panel: No results for input(s): VITAMINB12, FOLATE, FERRITIN, TIBC, IRON, RETICCTPCT in the last 72 hours. Urine analysis:    Component Value Date/Time   COLORURINE ORANGE (A) 01/25/2016 1611   APPEARANCEUR HAZY (A) 01/25/2016 1611   APPEARANCEUR Turbid (A) 12/05/2015 1206   LABSPEC 1.027 01/25/2016 1611   LABSPEC 1.019 04/18/2014 1455   PHURINE  01/25/2016 1611    TEST NOT REPORTED DUE TO COLOR INTERFERENCE OF URINE PIGMENT   GLUCOSEU (A) 01/25/2016 1611    TEST NOT REPORTED DUE TO COLOR INTERFERENCE OF URINE PIGMENT   GLUCOSEU Negative 04/18/2014 1455   HGBUR (A) 01/25/2016 1611    TEST NOT REPORTED DUE TO COLOR INTERFERENCE OF URINE PIGMENT   BILIRUBINUR (A) 01/25/2016 1611    TEST  NOT REPORTED DUE TO COLOR INTERFERENCE OF URINE PIGMENT   BILIRUBINUR Positive (A) 05/28/2015 1557   BILIRUBINUR Negative 04/18/2014 1455   KETONESUR (A) 01/25/2016 1611    TEST NOT REPORTED DUE TO COLOR INTERFERENCE OF URINE PIGMENT   PROTEINUR (A) 01/25/2016 1611    TEST NOT REPORTED DUE TO COLOR INTERFERENCE OF URINE PIGMENT   UROBILINOGEN 1.0 05/20/2014 0225   NITRITE (A) 01/25/2016 1611    TEST NOT REPORTED DUE TO COLOR INTERFERENCE OF URINE PIGMENT   LEUKOCYTESUR (A) 01/25/2016 1611    TEST NOT REPORTED DUE TO COLOR INTERFERENCE OF URINE PIGMENT   LEUKOCYTESUR 3+ (A) 05/28/2015 1557   LEUKOCYTESUR 2+ 04/18/2014 1455   Sepsis Labs: @LABRCNTIP (procalcitonin:4,lacticidven:4) ) Recent Results (from the past 240 hour(s))  Rapid Influenza A&B Antigens ([facility] only)     Status: None   Collection Time: 01/25/16  5:55 PM  Result Value Ref Range Status   Influenza A ([facility]) NEGATIVE NEGATIVE Final   Influenza B ([facility]) NEGATIVE NEGATIVE Final     Radiological Exams on Admission: Dg Chest 2 View  Result Date: 01/25/2016 CLINICAL DATA:  84 year old female with acute onset chest pressure jaw pain and shortness of breath. Hypoxic on room arrow. Initial encounter. EXAM: CHEST  2 VIEW COMPARISON:  CTA chest 06/01/2015 and earlier. FINDINGS: Seated AP and lateral views of the chest. Stable lung volumes. Stable cardiac size at the upper limits of normal. Other mediastinal contours are within normal limits. Visualized tracheal air column is within normal limits. Stable mild bilateral increased interstitial markings with no pneumothorax, pulmonary edema, pleural effusion or acute pulmonary opacity. Osteopenia. Stable visualized osseous structures. Calcified aortic atherosclerosis. Negative visible bowel gas pattern. IMPRESSION: No acute cardiopulmonary abnormality. Calcified aortic atherosclerosis. Electronically Signed   By: Genevie Ann M.D.   On: 01/25/2016 17:03   Ct Angio Chest Pe W Or Wo  Contrast  Result Date: 01/25/2016 CLINICAL DATA:  Chest pain and shortness  of breath EXAM: CT ANGIOGRAPHY CHEST WITH CONTRAST TECHNIQUE: Multidetector CT imaging of the chest was performed using the standard protocol during bolus administration of intravenous contrast. Multiplanar CT image reconstructions and MIPs were obtained to evaluate the vascular anatomy. CONTRAST:  75 mL Isovue 370 nonionic COMPARISON:  Chest CT Jun 01, 2015 chest radiograph January 25, 2016 FINDINGS: Cardiovascular: There is a small pulmonary embolus in a branch of the anterior segment right upper lobe pulmonary artery. No more central pulmonary embolus identified. The right ventricle to left ventricle diameter does not show evidence of right heart strain. There is no demonstrable thoracic aortic aneurysm or dissection. There is mild calcification in the proximal left subclavian artery. There is also mild calcification in the proximal right common carotid artery. Visualized great vessels otherwise appear normal. There are scattered foci of atherosclerotic calcification in the aorta. There are scattered foci of coronary artery calcification. There is no appreciable pericardial effusion. There is calcification in the aortic valve, stable. Mediastinum/Nodes: There are nodular opacities in the thyroid, largest measuring approximately 1 cm. There is a lymph node in the aortopulmonary window region measuring 1.1 x 1.1 cm, stable. There is a lymph node in the sub- carinal region measuring 1.5 x 1.5 cm, stable. Several smaller mediastinal lymph nodes are stable. No new lymph node prominence evident. Lungs/Pleura: On axial slice 41 series 6, there is a 4 mm nodular opacity in the anterior segment of the right upper lobe. On axial slice 47 series 6, there is a stable 6 x 5 mm nodular opacity in the posterior segment of the left upper lobe. On axial slice 51 series 6, there is a stable 6 x 5 mm nodular opacity with a nearby 5 x 4 mm nodular opacity in  this area. On axial slice 49 series 6, there is a 4 mm nodular opacity in the lingula anteriorly. There is a nodular opacity more posteriorly in the lingula on axial slice 49 series 5 measuring 4 mm. There is a nearby 3 mm nodular opacity in this area as well, stable. If there is a stable 3 mm nodular opacity in the inferior lingula anteriorly near the left heart border. There is patchy bibasilar atelectasis. There are areas of probable air trapping from small airways disease, primarily in the right lower lobe anteriorly and medially. There is no frank edema or consolidation. No pleural effusion or pleural thickening evident. Upper Abdomen: In the visualized upper abdomen, the left adrenal appears somewhat hypertrophied. There is atherosclerotic calcification in the aorta and major branch vessels. Musculoskeletal: There is degenerative change in the thoracic spine. There is no blastic or lytic bone lesion. Review of the MIP images confirms the above findings. IMPRESSION: Small pulmonary embolus in a peripheral branch of the anterior segment right upper lobe pulmonary artery. No larger pulmonary emboli evident. No appreciable right heart strain. Multiples areas of atherosclerotic calcification including multiple foci of coronary artery calcification. Several mildly prominent lymph nodes, stable. Several pulmonary nodular opacities as noted above. Patchy bibasilar atelectasis. Probable small airways disease, primarily on the right. No frank edema or consolidation. Multinodular goiter without dominant thyroid mass evident. Evidence a degree of left adrenal hypertrophy. Critical Value/emergent results were called by telephone at the time of interpretation on 01/25/2016 at 9:06 pm to Dr. Meade Maw , who verbally acknowledged these results. Electronically Signed   By: Lowella Grip III M.D.   On: 01/25/2016 21:08    EKG: Sinus tachycardia at 106, normal axis, no ST wave changes.  Assessment/Plan Active  Problems:   * No active hospital problems. *    This is a 83 y.o. female with a history of HTN, HLD, MI, CVA and h/o PE now being admitted with:  Pulmonary Emoblism - Admit to Inpatient Telemetry - Lovenox - Doppler Studies BLE for DVT  - Patient has L hemihypertrophy, however clinical suspicion  high due to new PE, relative immobility - Consult Vascular surgery   - h/o UE DVT 2/2 PICC line with Discontinuation of AC 2/2 bleeding, Consideration of IVC filter  UTI - Macrobid - F/U Urine culture - Gentle hydration  H/O HTN - Continue home medications Norvasc, Toprol XL  H/O HLD - Continue home medication Pravachol   Admission status: Inpatient Telemetry IV Fluids: NS @ 75 mL/hr Diet/Nutrition: Heart healthy Consults called: Vascular Surgery, Pharmacy, PT  DVT Px: Lovenox, SCDs and early ambulation Code Status: DNR Disposition Plan: To SNF in 2 days   All the records are reviewed and case discussed with ED provider. Management plans discussed with the patient and/or family who express understanding and agree with plan of care.  Ilayda Toda D.O. on 01/25/2016 at 10:30 PM Between 7am to 6pm - Pager - [phone] After 6pm go to [email or web address] - Proofreader [facility] Office [phone] CC: Primary care physician; Viviana Simpler, MD  Harvie Bridge MD [facility] Pager [phone]   If 7PM-7AM, please contact night-coverage [email or web address] Password [email or web address]  01/25/2016, 10:30 PM

## 2016-01-25 NOTE — Progress Notes (Signed)
ANTICOAGULATION CONSULT NOTE - Initial Consult  Pharmacy Consult for Lovenox  Indication: pulmonary embolus  Allergies  Allergen Reactions  . Sulfa Antibiotics Hives and Itching  . Ciprofloxacin Hives and Itching  . Levofloxacin Hives and Itching  . Penicillins Hives and Itching    Patient Measurements: Height: 5\' 3"  (160 cm) Weight: 121 lb (54.9 kg) IBW/kg (Calculated) : 52.4 Heparin Dosing Weight: 54.9 kg   Vital Signs: Temp: 99.3 F (37.4 C) (01/05 1557) Temp Source: Oral (01/05 1557) BP: 110/51 (01/05 2000) Pulse Rate: 88 (01/05 2000)  Labs:  Recent Labs  01/25/16 1610 01/25/16 1901  HGB 13.9  --   HCT 42.9  --   PLT 222  --   LABPROT 13.0  --   INR 0.98  --   CREATININE 0.81  --   TROPONINI <0.03 <0.03    Estimated Creatinine Clearance: 43.5 mL/min (by C-G formula based on SCr of 0.81 mg/dL).   Medical History: Past Medical History:  Diagnosis Date  . Arthritis   . Bleeding disorder ([place])   . Chronic cystitis   . Collagen vascular disease ([place])   . CVA (cerebral infarction) 2003  . Dysuria   . Gross hematuria   . Hematuria   . Hemihypertrophy   . History of kidney stones   . Hyperlipidemia   . Hypertension   . Murmur, cardiac   . Myocardial infarction   . Peripheral vascular disease ([place])    s/p stent right leg  . Pulmonary nodule    stable on Chest CT March 2014, Followed at [facility]  . Recurrent UTI   . Sensory urge incontinence   . Thyroid disease   . Urinary frequency     Medications:   (Not in a hospital admission)  Assessment: Pharmacy consulted to dose lovenox for PE in this 83 year old female.  No prior anticoag noted. CrCl = 43.5 ml/min   Goal of Therapy:  Heparin level 0.3-0.7 units/ml Monitor platelets by anticoagulation protocol: Yes   Plan:  Lovenox 55 mg SQ Q12H ordered to start on 1/5 @ 22:00.  Will check H&H and platelets daily.   Jawann Urbani D 01/25/2016,9:38 PM

## 2016-01-25 NOTE — ED Provider Notes (Signed)
Time Seen: Approximately 1622  I have reviewed the triage notes  Chief Complaint: Shortness of Breath and Chest Pain   History of Present Illness: Krista Ellis is a 83 y.o. female *who is a resident at a local nursing facility complained of some chest discomfort that just started this afternoon. She denies any jaw pain to this historian. She was found to have a low-grade fever per EMS and was placed on some supplemental oxygen for a pulse ox of 89%. Patient describes some chest pressure without nausea, vomiting. She's had some nasal congestion and a dry nonproductive cough over the last couple of days.   Past Medical History:  Diagnosis Date  . Arthritis   . Bleeding disorder ([place])   . Chronic cystitis   . CVA (cerebral infarction) 2003  . Dysuria   . Gross hematuria   . Hematuria   . Hemihypertrophy   . History of kidney stones   . Hyperlipidemia   . Hypertension   . Murmur, cardiac   . Myocardial infarction   . Peripheral vascular disease ([place])    s/p stent right leg  . Pulmonary nodule    stable on Chest CT March 2014, Followed at [facility]  . Recurrent UTI   . Sensory urge incontinence   . Thyroid disease   . Urinary frequency     Patient Active Problem List   Diagnosis Date Noted  . Acid reflux 04/13/2015  . Combined fat and carbohydrate induced hyperlipemia 04/13/2015  . Dysuria 03/01/2015  . Atrophic vaginitis 03/01/2015  . Microscopic hematuria 03/01/2015  . Bladder spasms 03/01/2015  . Pulmonary embolism ([place]) 09/22/2014  . UTI (urinary tract infection) 09/14/2014  . Enterococcus UTI 09/14/2014  . Generalized weakness 09/14/2014  . Sepsis ([place]) 09/10/2014  . Open wound of knee, leg (except thigh), and ankle, complicated [id number]  . Buttock pain 07/19/2014  . Hospital discharge follow-up 07/04/2014  . Facial droop   . Malnutrition of moderate degree ([place]) 05/21/2014  . Seizures ([place])   . TIA (transient ischemic attack) 05/18/2014  . Systolic murmur  [id number]  . Gait disturbance 05/15/2014  . Benign essential HTN 05/08/2014  . History of subdural hematoma   . Subdural hematoma ([place]) 05/05/2014  . B12 deficiency 03/09/2014  . Bradycardia 02/24/2014  . Arteriosclerosis of coronary artery 02/23/2014  . Recurrent UTI 01/03/2014  . Bilateral hand numbness 01/03/2014  . Nephrolithiasis 11/18/2013  . Recurrent falls 11/18/2013  . Closed rib fracture 09/25/2013  . Abnormal CT scan, chest 09/01/2013  . Chest wall pain 08/31/2013  . Hematoma and contusion 08/31/2013  . Atypical chest pain 06/17/2013  . Peripheral vascular disease ([place]) 06/17/2013  . Heart valve disease 06/17/2013  . Atherosclerotic peripheral vascular disease ([place]) 06/01/2013  . Bladder wall hemorrhage 12/10/2012  . Dysphagia, pharyngoesophageal phase 12/10/2012  . Malaise and fatigue 12/10/2012  . Frank hematuria 12/02/2012  . Bladder retention 12/02/2012  . Postmenopausal estrogen deficiency 11/30/2012  . Glucose found in urine on examination 11/30/2012  . Post menopausal syndrome 11/30/2012  . Type 2 diabetes mellitus with hyperglycemia ([place]) 11/24/2012  . Lung nodule, solitary 03/12/2012  . Diverticular disease of large intestine 12/25/2011  . 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 Symptoms involving urinary system 12/04/2011  . Medicare annual wellness visit, subsequent 10/01/2011  . Dyslipidemia 10/01/2011  . HLD (hyperlipidemia) 10/01/2011  . Encounter for general adult medical examination without abnormal findings 10/01/2011  . Bladder infection, chronic 09/29/2011  . Incomplete bladder emptying 09/29/2011  .  Urge incontinence of urine 09/29/2011  . Delayed onset of urination 09/29/2011  . Hypertension 10/17/2010  . Hypothyroidism 10/17/2010  . Osteoarthritis 10/17/2010  . Essential (primary) hypertension 10/17/2010  . Arthritis, degenerative 10/17/2010  . CN (constipation) 05/02/2010    Past Surgical History:  Procedure  Laterality Date  . ABDOMINAL HYSTERECTOMY  1982  . APPENDECTOMY    . CARDIAC SURGERY     2 failed stents in right leg  . CARPAL TUNNEL RELEASE    . HIP SURGERY  2007   replaced ball in right hip  . left rotator cuff     repair  . ruptured disc    . SPINE SURGERY     steel rod in back  . stent placement leg Right 2015  . TONSILLECTOMY    . TOTAL HIP ARTHROPLASTY     right  . TOTAL KNEE ARTHROPLASTY Left     Past Surgical History:  Procedure Laterality Date  . ABDOMINAL HYSTERECTOMY  1982  . APPENDECTOMY    . CARDIAC SURGERY     2 failed stents in right leg  . CARPAL TUNNEL RELEASE    . HIP SURGERY  2007   replaced ball in right hip  . left rotator cuff     repair  . ruptured disc    . SPINE SURGERY     steel rod in back  . stent placement leg Right 2015  . TONSILLECTOMY    . TOTAL HIP ARTHROPLASTY     right  . TOTAL KNEE ARTHROPLASTY Left     Current Outpatient Rx  . Order #: [id number] Class: Historical Med  . Order #: [id number] Class: Historical Med  . Order #: [id number] Class: Historical Med  . Order #: [id number] Class: Historical Med  . Order #: [id number] Class: Historical Med  . Order #: [id number] Class: Historical Med  . Order #: [id number] Class: Historical Med  . Order #: [id number] Class: Historical Med  . Order #: [id number] Class: Historical Med  . Order #: [id number] Class: Historical Med  . Order #: [id number] Class: Normal  . Order #: [id number] Class: No Print  . Order #: [id number] Class: Historical Med  . Order #: [id number] Class: Historical Med  . Order #: [id number] Class: Historical Med  . Order #: [id number] Class: Historical Med  . Order #: [id number] Class: Historical Med  . Order #: [id number] Class: Print  . Order #: [id number] Class: Print  . Order #: [id number] Class: Historical Med  . Order #: [id number] Class: Normal  . Order #: [id number] Class: Historical Med  . Order #: [id number] Class: Historical Med  . Order #: [id number] Class: Historical Med  . Order #:  [id number] Class: Print    Allergies:  Sulfa antibiotics; Ciprofloxacin; Levofloxacin; and Penicillins  Family History: Family History  Problem Relation Age of Onset  . Hypertension Mother   . Transient ischemic attack Mother   . Kidney disease Neg Hx   . Bladder Cancer Neg Hx   . Prostate cancer Neg Hx     Social History: Social History  Substance Use Topics  . Smoking status: Never Smoker  . Smokeless tobacco: Never Used  . Alcohol use No     Review of Systems:   10 point review of systems was performed and was otherwise negative:  Constitutional: No feverUntil EMS arrival Eyes: No visual disturbances ENT: No sore throat, ear pain Cardiac: Left-sided chest pressure. No obvious pleuritic or reproducible component. Respiratory: Shortness of breath without wheezing or stridor Abdomen: No abdominal pain, no vomiting, No diarrhea Endocrine: No weight loss, No night sweats Extremities: No peripheral edema, cyanosis Skin:  No rashes, easy bruising Neurologic: No focal weakness, trouble with speech or swollowing Urologic: No dysuria, Hematuria, or urinary frequency   Physical Exam:  ED Triage Vitals  Enc Vitals Group     BP 01/25/16 1557 (!) 126/49     Pulse Rate 01/25/16 1557 (!) 108     Resp 01/25/16 1557 (!) 26     Temp 01/25/16 1557 99.3 F (37.4 C)     Temp Source 01/25/16 1557 Oral     SpO2 01/25/16 1557 94 %     Weight 01/25/16 1559 121 lb (54.9 kg)     Height 01/25/16 1559 5\' 3"  (1.6 m)     Head Circumference --      Peak Flow --      Pain Score --      Pain Loc --      Pain Edu? --      Excl. in [place]? --     General: Awake , Alert , and Oriented times 3; GCS 15 No signs of respiratory distress. Able to speak in full complete sentences. Head: Normal cephalic , atraumatic Eyes: Pupils equal , round, reactive to light Nose/Throat: No nasal drainage, patent upper airway without erythema or exudate.  Neck: Supple, Full range of motion, No anterior  adenopathy or palpable thyroid masses Lungs: Clear to ascultation with diminished breath sounds bilaterally to bases without rales or rhonchi noted Heart: Regular rate, regular rhythm with coarse left-sided murmur systolic left sternal border, no obvious gallops or rubs Abdomen: Soft, non tender without rebound, guarding , or rigidity; bowel sounds positive and symmetric in all 4 quadrants. No organomegaly .        Extremities: 2 plus symmetric pulses. No edema, clubbing or cyanosis Neurologic: normal ambulation, Motor symmetric without deficits, sensory intact Skin: warm, dry, no rashes   Labs:   All laboratory work was reviewed including any pertinent negatives or positives listed below:  Labs Reviewed  RAPID INFLUENZA A&B ANTIGENS ([place])  CBC  COMPREHENSIVE METABOLIC PANEL  TROPONIN I  PROTIME-INR  URINALYSIS, ROUTINE W REFLEX MICROSCOPIC  BRAIN NATRIURETIC PEPTIDE    EKG: ED ECG REPORT I, Daymon Larsen, the attending physician, personally viewed and interpreted this ECG.  Date: 01/25/2016 EKG Time:1556 Rate: 106 Rhythm: Sinus tachycardia QRS Axis: normal Intervals: normal ST/T Wave abnormalities: normal Conduction Disturbances: none Narrative Interpretation: unremarkable No acute ischemic changes   Radiology: *  "Dg Chest 2 View  Result Date: 01/25/2016 CLINICAL DATA:  83 year old female with acute onset chest pressure jaw pain and shortness of breath. Hypoxic on room arrow. Initial encounter. EXAM: CHEST  2 VIEW COMPARISON:  CTA chest 06/01/2015 and earlier. FINDINGS: Seated AP and lateral views of the chest. Stable lung volumes. Stable cardiac size at the upper limits of normal. Other mediastinal contours are within normal limits. Visualized tracheal air column is within normal limits. Stable mild bilateral increased interstitial markings with no pneumothorax, pulmonary edema, pleural effusion or acute pulmonary opacity. Osteopenia. Stable visualized osseous  structures. Calcified aortic atherosclerosis. Negative visible bowel gas pattern. IMPRESSION: No acute cardiopulmonary abnormality. Calcified aortic atherosclerosis. Electronically Signed   By: Genevie Ann M.D.   On: 01/25/2016 17:03   Ct Angio Chest Pe W Or Wo Contrast  Result Date: 01/25/2016 CLINICAL DATA:  Chest pain and shortness of breath EXAM: CT ANGIOGRAPHY CHEST WITH CONTRAST TECHNIQUE: Multidetector CT imaging of the chest was performed using the standard protocol during bolus administration of intravenous contrast. Multiplanar CT image reconstructions and MIPs  were obtained to evaluate the vascular anatomy. CONTRAST:  75 mL Isovue 370 nonionic COMPARISON:  Chest CT Jun 01, 2015 chest radiograph January 25, 2016 FINDINGS: Cardiovascular: There is a small pulmonary embolus in a branch of the anterior segment right upper lobe pulmonary artery. No more central pulmonary embolus identified. The right ventricle to left ventricle diameter does not show evidence of right heart strain. There is no demonstrable thoracic aortic aneurysm or dissection. There is mild calcification in the proximal left subclavian artery. There is also mild calcification in the proximal right common carotid artery. Visualized great vessels otherwise appear normal. There are scattered foci of atherosclerotic calcification in the aorta. There are scattered foci of coronary artery calcification. There is no appreciable pericardial effusion. There is calcification in the aortic valve, stable. Mediastinum/Nodes: There are nodular opacities in the thyroid, largest measuring approximately 1 cm. There is a lymph node in the aortopulmonary window region measuring 1.1 x 1.1 cm, stable. There is a lymph node in the sub- carinal region measuring 1.5 x 1.5 cm, stable. Several smaller mediastinal lymph nodes are stable. No new lymph node prominence evident. Lungs/Pleura: On axial slice 41 series 6, there is a 4 mm nodular opacity in the anterior  segment of the right upper lobe. On axial slice 47 series 6, there is a stable 6 x 5 mm nodular opacity in the posterior segment of the left upper lobe. On axial slice 51 series 6, there is a stable 6 x 5 mm nodular opacity with a nearby 5 x 4 mm nodular opacity in this area. On axial slice 49 series 6, there is a 4 mm nodular opacity in the lingula anteriorly. There is a nodular opacity more posteriorly in the lingula on axial slice 49 series 5 measuring 4 mm. There is a nearby 3 mm nodular opacity in this area as well, stable. If there is a stable 3 mm nodular opacity in the inferior lingula anteriorly near the left heart border. There is patchy bibasilar atelectasis. There are areas of probable air trapping from small airways disease, primarily in the right lower lobe anteriorly and medially. There is no frank edema or consolidation. No pleural effusion or pleural thickening evident. Upper Abdomen: In the visualized upper abdomen, the left adrenal appears somewhat hypertrophied. There is atherosclerotic calcification in the aorta and major branch vessels. Musculoskeletal: There is degenerative change in the thoracic spine. There is no blastic or lytic bone lesion. Review of the MIP images confirms the above findings. IMPRESSION: Small pulmonary embolus in a peripheral branch of the anterior segment right upper lobe pulmonary artery. No larger pulmonary emboli evident. No appreciable right heart strain. Multiples areas of atherosclerotic calcification including multiple foci of coronary artery calcification. Several mildly prominent lymph nodes, stable. Several pulmonary nodular opacities as noted above. Patchy bibasilar atelectasis. Probable small airways disease, primarily on the right. No frank edema or consolidation. Multinodular goiter without dominant thyroid mass evident. Evidence a degree of left adrenal hypertrophy. Critical Value/emergent results were called by telephone at the time of interpretation on  01/25/2016 at 9:06 pm to Dr. Meade Maw , who verbally acknowledged these results. Electronically Signed   By: Lowella Grip III M.D.   On: 01/25/2016 21:08  "   I personally reviewed the radiologic studies    ED Course:  given the patient's mild hypoxia with no chest x-ray findings to indicate pneumonia, etc. I felt the patient required a D-dimer test which was positive for screening for pulmonary embolism  and then underwent a chest CT evaluation which shows a very small left-sided clot. The patient's currently on Plavix and she is otherwise hemodynamically stable and I felt we could wait pharmaceutical assessment for whether or not to initiate Lovenox or other medications. I did speak to the hospitalist due to her age and her mild hypoxia that she would require at least inpatient observation as we initiate her anticoagulation therapy. Clinical Course      Assessment: Acute pulmonary embolism      Plan: * Inpatient            Daymon Larsen, MD 01/25/16 2159

## 2016-01-25 NOTE — ED Notes (Signed)
Patient transported to X-ray. Family remains in room with pt belongings.

## 2016-01-26 ENCOUNTER — Inpatient Hospital Stay: Payer: Medicare Other

## 2016-01-26 DIAGNOSIS — R079 Chest pain, unspecified: Secondary | ICD-10-CM

## 2016-01-26 DIAGNOSIS — R0602 Shortness of breath: Secondary | ICD-10-CM

## 2016-01-26 DIAGNOSIS — I2699 Other pulmonary embolism without acute cor pulmonale: Secondary | ICD-10-CM

## 2016-01-26 DIAGNOSIS — I70213 Atherosclerosis of native arteries of extremities with intermittent claudication, bilateral legs: Secondary | ICD-10-CM

## 2016-01-26 LAB — BASIC METABOLIC PANEL
Anion gap: 6 (ref 5–15)
BUN: 26 mg/dL — AB (ref 6–20)
CO2: 24 mmol/L (ref 22–32)
Calcium: 9.1 mg/dL (ref 8.9–10.3)
Chloride: 107 mmol/L (ref 101–111)
Creatinine, Ser: 0.91 mg/dL (ref 0.44–1.00)
GFR calc Af Amer: >60 mL/min (ref >60)
GFR, EST NON AFRICAN AMERICAN: 57 mL/min — AB (ref >60)
GLUCOSE: 94 mg/dL (ref 65–99)
POTASSIUM: 4.2 mmol/L (ref 3.5–5.1)
Sodium: 137 mmol/L (ref 135–145)

## 2016-01-26 LAB — CBC
HEMATOCRIT: 35.1 % (ref 35.0–47.0)
Hemoglobin: 11.8 g/dL — ABNORMAL LOW (ref 12.0–16.0)
MCH: 31.9 pg (ref 26.0–34.0)
MCHC: 33.7 g/dL (ref 32.0–36.0)
MCV: 94.7 fL (ref 80.0–100.0)
Platelets: 189 10*3/uL (ref 150–440)
RBC: 3.7 MIL/uL — ABNORMAL LOW (ref 3.80–5.20)
RDW: 13.5 % (ref 11.5–14.5)
WBC: 17 10*3/uL — ABNORMAL HIGH (ref 3.6–11.0)

## 2016-01-26 LAB — MRSA PCR SCREENING: MRSA by PCR: POSITIVE — AB

## 2016-01-26 MED ORDER — NITROFURANTOIN MONOHYD MACRO 100 MG PO CAPS
100.0000 mg | ORAL_CAPSULE | Freq: Every day | ORAL | Status: DC
  Administered 2016-01-26: 100 mg via ORAL
  Filled 2016-01-26: qty 1

## 2016-01-26 MED ORDER — SODIUM CHLORIDE 0.9% FLUSH
3.0000 mL | Freq: Two times a day (BID) | INTRAVENOUS | Status: DC
  Administered 2016-01-26 – 2016-01-27 (×4): 3 mL via INTRAVENOUS

## 2016-01-26 MED ORDER — OXYCODONE HCL 5 MG PO TABS
5.0000 mg | ORAL_TABLET | ORAL | Status: DC | PRN
  Administered 2016-01-26 – 2016-01-27 (×3): 5 mg via ORAL
  Filled 2016-01-26 (×3): qty 1

## 2016-01-26 MED ORDER — BISACODYL 10 MG RE SUPP: 10.0000 mg | Freq: Every day | RECTAL | Status: DC | PRN

## 2016-01-26 MED ORDER — AMLODIPINE BESYLATE 5 MG PO TABS
5.0000 mg | ORAL_TABLET | Freq: Every day | ORAL | Status: DC
  Administered 2016-01-27: 5 mg via ORAL
  Filled 2016-01-26 (×3): qty 1

## 2016-01-26 MED ORDER — APIXABAN 5 MG PO TABS
10.0000 mg | ORAL_TABLET | Freq: Two times a day (BID) | ORAL | Status: DC
  Administered 2016-01-26 – 2016-01-27 (×2): 10 mg via ORAL
  Filled 2016-01-26 (×3): qty 2

## 2016-01-26 MED ORDER — OXYBUTYNIN CHLORIDE 5 MG PO TABS
2.5000 mg | ORAL_TABLET | Freq: Two times a day (BID) | ORAL | Status: DC
  Administered 2016-01-26 – 2016-01-27 (×3): 2.5 mg via ORAL
  Filled 2016-01-26 (×2): qty 1
  Filled 2016-01-26 (×2): qty 0.5

## 2016-01-26 MED ORDER — PRAVASTATIN SODIUM 20 MG PO TABS
40.0000 mg | ORAL_TABLET | Freq: Every evening | ORAL | Status: DC
  Administered 2016-01-26: 40 mg via ORAL
  Filled 2016-01-26: qty 2

## 2016-01-26 MED ORDER — MENTHOL 3 MG MT LOZG
1.0000 | LOZENGE | OROMUCOSAL | Status: DC | PRN
  Administered 2016-01-27: 3 mg via ORAL
  Filled 2016-01-26: qty 9

## 2016-01-26 MED ORDER — LEVOTHYROXINE SODIUM 25 MCG PO TABS
25.0000 ug | ORAL_TABLET | Freq: Every day | ORAL | Status: DC
  Administered 2016-01-26 – 2016-01-27 (×2): 25 ug via ORAL
  Filled 2016-01-26 (×2): qty 1

## 2016-01-26 MED ORDER — SENNOSIDES-DOCUSATE SODIUM 8.6-50 MG PO TABS
1.0000 | ORAL_TABLET | Freq: Two times a day (BID) | ORAL | Status: DC
  Administered 2016-01-26 – 2016-01-27 (×4): 1 via ORAL
  Filled 2016-01-26 (×5): qty 1

## 2016-01-26 MED ORDER — CHLORHEXIDINE GLUCONATE CLOTH 2 % EX PADS
6.0000 | MEDICATED_PAD | Freq: Every day | CUTANEOUS | Status: DC
  Administered 2016-01-27: 6 via TOPICAL

## 2016-01-26 MED ORDER — SALINE SPRAY 0.65 % NA SOLN: 1.0000 | NASAL | Status: DC | PRN

## 2016-01-26 MED ORDER — RISAQUAD PO CAPS
1.0000 | ORAL_CAPSULE | Freq: Every day | ORAL | Status: DC
  Administered 2016-01-26 – 2016-01-27 (×2): 1 via ORAL
  Filled 2016-01-26 (×3): qty 1

## 2016-01-26 MED ORDER — SODIUM CHLORIDE 0.9 % IV SOLN
INTRAVENOUS | Status: DC
  Administered 2016-01-26: 02:00:00 via INTRAVENOUS

## 2016-01-26 MED ORDER — MAGNESIUM HYDROXIDE 400 MG/5ML PO SUSP: 30.0000 mL | Freq: Every day | ORAL | Status: DC | PRN

## 2016-01-26 MED ORDER — POLYETHYLENE GLYCOL 3350 17 G PO PACK: 17.0000 g | PACK | Freq: Every day | ORAL | Status: DC | PRN

## 2016-01-26 MED ORDER — CEFTRIAXONE SODIUM-DEXTROSE 1-3.74 GM-% IV SOLR
1.0000 g | INTRAVENOUS | Status: DC
  Administered 2016-01-26: 1 g via INTRAVENOUS
  Filled 2016-01-26 (×2): qty 50

## 2016-01-26 MED ORDER — CLOPIDOGREL BISULFATE 75 MG PO TABS
75.0000 mg | ORAL_TABLET | Freq: Every day | ORAL | Status: DC
  Administered 2016-01-26: 75 mg via ORAL
  Filled 2016-01-26: qty 1

## 2016-01-26 MED ORDER — METOPROLOL SUCCINATE ER 25 MG PO TB24
12.5000 mg | ORAL_TABLET | Freq: Every day | ORAL | Status: DC
  Administered 2016-01-26 – 2016-01-27 (×2): 12.5 mg via ORAL
  Filled 2016-01-26 (×3): qty 1

## 2016-01-26 MED ORDER — TRAMADOL HCL 50 MG PO TABS: 25.0000 mg | ORAL_TABLET | Freq: Three times a day (TID) | ORAL | Status: DC | PRN

## 2016-01-26 MED ORDER — LORATADINE 10 MG PO TABS
10.0000 mg | ORAL_TABLET | Freq: Every day | ORAL | Status: DC
Start: 2016-01-26 — End: 2016-01-27
  Administered 2016-01-26 – 2016-01-27 (×2): 10 mg via ORAL
  Filled 2016-01-26 (×3): qty 1

## 2016-01-26 MED ORDER — DEXTROMETHORPHAN-GUAIFENESIN 10-100 MG/5ML PO LIQD
10.0000 mL | ORAL | Status: DC | PRN
  Filled 2016-01-26: qty 10

## 2016-01-26 MED ORDER — MUPIROCIN 2 % EX OINT
1.0000 "application " | TOPICAL_OINTMENT | Freq: Two times a day (BID) | CUTANEOUS | Status: DC
  Administered 2016-01-26 – 2016-01-27 (×3): 1 via NASAL
  Filled 2016-01-26: qty 22

## 2016-01-26 MED ORDER — CRANBERRY 425 MG PO CAPS: 425.0000 mg | ORAL_CAPSULE | Freq: Every day | ORAL | Status: DC

## 2016-01-26 MED ORDER — APIXABAN 5 MG PO TABS: 5.0000 mg | ORAL_TABLET | Freq: Two times a day (BID) | ORAL | Status: DC

## 2016-01-26 MED ORDER — ORAL CARE MOUTH RINSE
15.0000 mL | Freq: Two times a day (BID) | OROMUCOSAL | Status: DC
  Administered 2016-01-26 – 2016-01-27 (×3): 15 mL via OROMUCOSAL

## 2016-01-26 MED ORDER — ONDANSETRON HCL 4 MG PO TABS: 4.0000 mg | ORAL_TABLET | Freq: Three times a day (TID) | ORAL | Status: DC | PRN

## 2016-01-26 MED ORDER — ACETAMINOPHEN 325 MG PO TABS: 325.0000 mg | ORAL_TABLET | ORAL | Status: DC | PRN

## 2016-01-26 NOTE — Progress Notes (Signed)
Patient ID: Krista Ellis, female   DOB: [birth year], 83 y.o.   MRN: [id number]  [facility] PROGRESS NOTE  Krista Ellis [id number] DOB: [birth year] DOA: 01/25/2016 PCP: Viviana Simpler, MD  HPI/Subjective: Patient feels okay now. Had confusion upon coming in. Admitting physician documented chest pain and shortness of breath but the patient does not complain of that at this point.  Objective: Vitals:   01/26/16 0943 01/26/16 1143  BP: (!) 118/36 (!) 122/41  Pulse: 77   Resp: 16   Temp: 98.1 F (36.7 C)     Filed Weights   01/25/16 1559 01/26/16 0123  Weight: 54.9 kg (121 lb) 55.8 kg (123 lb)    ROS: Review of Systems  Constitutional: Negative for chills and fever.  Eyes: Negative for blurred vision.  Respiratory: Negative for cough and shortness of breath.   Cardiovascular: Negative for chest pain.  Gastrointestinal: Negative for abdominal pain, constipation, diarrhea, nausea and vomiting.  Genitourinary: Negative for dysuria.  Musculoskeletal: Negative for joint pain.  Neurological: Negative for dizziness and headaches.   Exam: Physical Exam  Constitutional: She is oriented to person, place, and time.  HENT:  Nose: No mucosal edema.  Mouth/Throat: No oropharyngeal exudate or posterior oropharyngeal edema.  Eyes: Conjunctivae, EOM and lids are normal. Pupils are equal, round, and reactive to light.  Neck: No JVD present. Carotid bruit is not present. No edema present. No thyroid mass and no thyromegaly present.  Cardiovascular: S1 normal and S2 normal.  Exam reveals no gallop.   Murmur heard.  Systolic murmur is present with a grade of 2/6  Pulses:      Dorsalis pedis pulses are 2+ on the right side, and 2+ on the left side.  Respiratory: No respiratory distress. She has no wheezes. She has no rhonchi. She has no rales.  GI: Soft. Bowel sounds are normal. There is no tenderness.  Musculoskeletal:       Right ankle: She exhibits no swelling.       Left  ankle: She exhibits no swelling.  Lymphadenopathy:    She has no cervical adenopathy.  Neurological: She is alert and oriented to person, place, and time. No cranial nerve deficit.  Skin: Skin is warm. No rash noted. Nails show no clubbing.  Psychiatric: She has a normal mood and affect.      Data Reviewed: Basic Metabolic Panel:  Recent Labs Lab 01/25/16 1610 01/26/16 0606  NA 137 137  K 3.7 4.2  CL 107 107  CO2 26 24  GLUCOSE 105* 94  BUN 21* 26*  CREATININE 0.81 0.91  CALCIUM 9.9 9.1   Liver Function Tests:  Recent Labs Lab 01/25/16 1610  AST 61*  ALT 22  ALKPHOS 49  BILITOT 0.6  PROT 7.3  ALBUMIN 3.9   CBC:  Recent Labs Lab 01/25/16 1610 01/26/16 0606  WBC 8.9 17.0*  HGB 13.9 11.8*  HCT 42.9 35.1  MCV 96.4 94.7  PLT 222 189   Cardiac Enzymes:  Recent Labs Lab 01/25/16 1610 01/25/16 1901  TROPONINI <0.03 <0.03   BNP (last 3 results)  Recent Labs  01/25/16 1610  BNP 78.0      Recent Results (from the past 240 hour(s))  Rapid Influenza A&B Antigens ([facility] only)     Status: None   Collection Time: 01/25/16  5:55 PM  Result Value Ref Range Status   Influenza A ([facility]) NEGATIVE NEGATIVE Final   Influenza B ([facility]) NEGATIVE NEGATIVE Final  MRSA PCR Screening  Status: Abnormal   Collection Time: 01/26/16  1:29 AM  Result Value Ref Range Status   MRSA by PCR POSITIVE (A) NEGATIVE Final    Comment:        The GeneXpert MRSA Assay (FDA approved for NASAL specimens only), is one component of a comprehensive MRSA colonization surveillance program. It is not intended to diagnose MRSA infection nor to guide or monitor treatment for MRSA infections. RESULT CALLED TO, READ BACK BY AND VERIFIED WITH: MICHELE RODGERS AT [id number] 01/26/16.PMH      Studies: Dg Chest 2 View  Result Date: 01/25/2016 CLINICAL DATA:  83 year old female with acute onset chest pressure jaw pain and shortness of breath. Hypoxic on room arrow. Initial encounter. EXAM:  CHEST  2 VIEW COMPARISON:  CTA chest 06/01/2015 and earlier. FINDINGS: Seated AP and lateral views of the chest. Stable lung volumes. Stable cardiac size at the upper limits of normal. Other mediastinal contours are within normal limits. Visualized tracheal air column is within normal limits. Stable mild bilateral increased interstitial markings with no pneumothorax, pulmonary edema, pleural effusion or acute pulmonary opacity. Osteopenia. Stable visualized osseous structures. Calcified aortic atherosclerosis. Negative visible bowel gas pattern. IMPRESSION: No acute cardiopulmonary abnormality. Calcified aortic atherosclerosis. Electronically Signed   By: Genevie Ann M.D.   On: 01/25/2016 17:03   Ct Angio Chest Pe W Or Wo Contrast  Result Date: 01/25/2016 CLINICAL DATA:  Chest pain and shortness of breath EXAM: CT ANGIOGRAPHY CHEST WITH CONTRAST TECHNIQUE: Multidetector CT imaging of the chest was performed using the standard protocol during bolus administration of intravenous contrast. Multiplanar CT image reconstructions and MIPs were obtained to evaluate the vascular anatomy. CONTRAST:  75 mL Isovue 370 nonionic COMPARISON:  Chest CT Jun 01, 2015 chest radiograph January 25, 2016 FINDINGS: Cardiovascular: There is a small pulmonary embolus in a branch of the anterior segment right upper lobe pulmonary artery. No more central pulmonary embolus identified. The right ventricle to left ventricle diameter does not show evidence of right heart strain. There is no demonstrable thoracic aortic aneurysm or dissection. There is mild calcification in the proximal left subclavian artery. There is also mild calcification in the proximal right common carotid artery. Visualized great vessels otherwise appear normal. There are scattered foci of atherosclerotic calcification in the aorta. There are scattered foci of coronary artery calcification. There is no appreciable pericardial effusion. There is calcification in the aortic  valve, stable. Mediastinum/Nodes: There are nodular opacities in the thyroid, largest measuring approximately 1 cm. There is a lymph node in the aortopulmonary window region measuring 1.1 x 1.1 cm, stable. There is a lymph node in the sub- carinal region measuring 1.5 x 1.5 cm, stable. Several smaller mediastinal lymph nodes are stable. No new lymph node prominence evident. Lungs/Pleura: On axial slice 41 series 6, there is a 4 mm nodular opacity in the anterior segment of the right upper lobe. On axial slice 47 series 6, there is a stable 6 x 5 mm nodular opacity in the posterior segment of the left upper lobe. On axial slice 51 series 6, there is a stable 6 x 5 mm nodular opacity with a nearby 5 x 4 mm nodular opacity in this area. On axial slice 49 series 6, there is a 4 mm nodular opacity in the lingula anteriorly. There is a nodular opacity more posteriorly in the lingula on axial slice 49 series 5 measuring 4 mm. There is a nearby 3 mm nodular opacity in this area as well, stable. If  there is a stable 3 mm nodular opacity in the inferior lingula anteriorly near the left heart border. There is patchy bibasilar atelectasis. There are areas of probable air trapping from small airways disease, primarily in the right lower lobe anteriorly and medially. There is no frank edema or consolidation. No pleural effusion or pleural thickening evident. Upper Abdomen: In the visualized upper abdomen, the left adrenal appears somewhat hypertrophied. There is atherosclerotic calcification in the aorta and major branch vessels. Musculoskeletal: There is degenerative change in the thoracic spine. There is no blastic or lytic bone lesion. Review of the MIP images confirms the above findings. IMPRESSION: Small pulmonary embolus in a peripheral branch of the anterior segment right upper lobe pulmonary artery. No larger pulmonary emboli evident. No appreciable right heart strain. Multiples areas of atherosclerotic calcification  including multiple foci of coronary artery calcification. Several mildly prominent lymph nodes, stable. Several pulmonary nodular opacities as noted above. Patchy bibasilar atelectasis. Probable small airways disease, primarily on the right. No frank edema or consolidation. Multinodular goiter without dominant thyroid mass evident. Evidence a degree of left adrenal hypertrophy. Critical Value/emergent results were called by telephone at the time of interpretation on 01/25/2016 at 9:06 pm to Dr. Meade Maw , who verbally acknowledged these results. Electronically Signed   By: Lowella Grip III M.D.   On: 01/25/2016 21:08   US Venous Img Lower Bilateral  Result Date: 01/26/2016 CLINICAL DATA:  Question small right upper lobe pulmonary embolus. EXAM: BILATERAL LOWER EXTREMITY VENOUS DOPPLER ULTRASOUND TECHNIQUE: Gray-scale sonography with graded compression, as well as color Doppler and duplex ultrasound were performed to evaluate the lower extremity deep venous systems from the level of the common femoral vein and including the common femoral, femoral, profunda femoral, popliteal and calf veins including the posterior tibial, peroneal and gastrocnemius veins when visible. The superficial great saphenous vein was also interrogated. Spectral Doppler was utilized to evaluate flow at rest and with distal augmentation maneuvers in the common femoral, femoral and popliteal veins. COMPARISON:  01/25/2016 FINDINGS: RIGHT LOWER EXTREMITY Common Femoral Vein: No evidence of thrombus. Normal compressibility, respiratory phasicity and response to augmentation. Saphenofemoral Junction: No evidence of thrombus. Normal compressibility and flow on color Doppler imaging. Profunda Femoral Vein: No evidence of thrombus. Normal compressibility and flow on color Doppler imaging. Femoral Vein: No evidence of thrombus. Normal compressibility, respiratory phasicity and response to augmentation. Popliteal Vein: No evidence of  thrombus. Normal compressibility, respiratory phasicity and response to augmentation. Calf Veins: Limited assessment of the calf veins. No significant occlusive thrombus visualized. Superficial Great Saphenous Vein: No evidence of thrombus. Normal compressibility and flow on color Doppler imaging. Venous Reflux:  None. Other Findings:  None. LEFT LOWER EXTREMITY Common Femoral Vein: No evidence of thrombus. Normal compressibility, respiratory phasicity and response to augmentation. Saphenofemoral Junction: No evidence of thrombus. Normal compressibility and flow on color Doppler imaging. Profunda Femoral Vein: No evidence of thrombus. Normal compressibility and flow on color Doppler imaging. Femoral Vein: No evidence of thrombus. Normal compressibility, respiratory phasicity and response to augmentation. Popliteal Vein: No evidence of thrombus. Normal compressibility, respiratory phasicity and response to augmentation. Calf Veins: No evidence of thrombus. Normal compressibility and flow on color Doppler imaging. Superficial Great Saphenous Vein: No evidence of thrombus. Normal compressibility and flow on color Doppler imaging. Venous Reflux:  None. Other Findings:  None. IMPRESSION: No significant DVT in either lower extremity. Limited assessment of the calf veins on the right. Electronically Signed   By: Jerilynn Mages.  Shick M.D.  On: 01/26/2016 11:20    Scheduled Meds: . acidophilus  1 capsule Oral Daily  . amLODipine  5 mg Oral Daily  . apixaban  10 mg Oral BID   Followed by  . [START ON 02/02/2016] apixaban  5 mg Oral BID  . cefTRIAXone  1 g Intravenous Q24H  . Chlorhexidine Gluconate Cloth  6 each Topical Q0600  . levothyroxine  25 mcg Oral QAC breakfast  . loratadine  10 mg Oral Daily  . mouth rinse  15 mL Mouth Rinse BID  . metoprolol succinate  12.5 mg Oral Daily  . mupirocin ointment  1 application Nasal BID  . oxybutynin  2.5 mg Oral BID  . pravastatin  40 mg Oral QPM  . senna-docusate  1 tablet  Oral BID  . sodium chloride flush  3 mL Intravenous Q12H    Assessment/Plan:  1. Acute pulmonary embolism with no DVT seen on ultrasound the lower extremity. Patient was started on high-dose Lovenox. I will switch over to Eliquis. Potential out to her facility tomorrow 2. Acute encephalopathy with acute cystitis with hematuria. Patient received IV Rocephin last night and I will continue to give Rocephin. Await urine culture results. 3. Hypothyroidism unspecified on levothyroxine 4. Essential hypertension on metoprolol and Norvasc 5. Hyperlipidemia unspecified on pravastatin 6. Urinary frequency on oxybutynin  Code Status:     Code Status Orders        Start     Ordered   01/26/16 0159  Do not attempt resuscitation (DNR)  Continuous    Question Answer Comment  In the event of cardiac or respiratory ARREST Do not call a "code blue"   In the event of cardiac or respiratory ARREST Do not perform Intubation, CPR, defibrillation or ACLS   In the event of cardiac or respiratory ARREST Use medication by any route, position, wound care, and other measures to relive pain and suffering. May use oxygen, suction and manual treatment of airway obstruction as needed for comfort.      01/26/16 0158    Code Status History    Date Active Date Inactive Code Status Order ID Comments User Context   01/26/2016  1:27 AM 01/26/2016  1:58 AM Full Code [id number]  Harvie Bridge, DO Inpatient   09/22/2014  4:21 PM 09/24/2014  5:08 PM DNR [id number]  Vaughan Basta, MD Inpatient   09/10/2014  9:37 PM 09/14/2014  7:24 PM DNR [id number]  Gladstone Lighter, MD Inpatient   05/18/2014  7:03 PM 05/24/2014  4:31 PM DNR [id number]  Samella Parr, NP Inpatient   05/05/2014 11:15 PM 05/06/2014  5:26 PM Partial Code [id number]  Mariea Clonts, MD Inpatient    Advance Directive Documentation   Flowsheet Row Most Recent Value  Type of Advance Directive  Healthcare Power of Attorney  Pre-existing out of facility DNR order  (yellow form or pink MOST form)  No data  "MOST" Form in Place?  No data     Family Communication: Family at bedside Disposition Plan: Back to facility tomorrow  Consultants:  Vascular surgery  Antibiotics:  Rocephin  Time spent: 28 minutes  [place], [place]

## 2016-01-26 NOTE — Progress Notes (Signed)
Pt positive for MRSA.  Orders placed.Initiated precautions. Will continue to monitor and assess.

## 2016-01-26 NOTE — Progress Notes (Signed)
PT Cancellation Note  Patient Details Name: Krista Ellis MRN: [id number] DOB: 1930/03/13   Cancelled Treatment:    Reason Eval/Treat Not Completed: Medical issues which prohibited therapy.  Awaiting treatment for PE and will see when ready medically.   Ramond Dial 01/26/2016, 1:17 PM

## 2016-01-26 NOTE — Progress Notes (Signed)
Pt's BP low at 118/36 (55) and HR is 77. Dr. Leslye Peer notified. Per Dr. Leslye Peer, Will hold her AM dose of amlodipine, but administer AM dose of metoprolol.

## 2016-01-26 NOTE — Progress Notes (Signed)
PHARMACIST - PHYSICIAN ORDER COMMUNICATION  CONCERNING: P&T Medication Policy on Herbal Medications  DESCRIPTION:  This patient's order for:  Cranberry  has been noted.  This product(s) is classified as an "herbal" or natural product. Due to a lack of definitive safety studies or FDA approval, nonstandard manufacturing practices, plus the potential risk of unknown drug-drug interactions while on inpatient medications, the Pharmacy and Therapeutics Committee does not permit the use of "herbal" or natural products of this type within [place].   ACTION TAKEN: The pharmacy department is unable to verify this order at this time. Please reevaluate patient's clinical condition at discharge and address if the herbal or natural product(s) should be resumed at that time.    

## 2016-01-26 NOTE — Progress Notes (Signed)
Pt arrived via stretcher for ED. Telemetry monitor applied and called to CCMD. Pt A&O with no complaints of pain or sob. Skin intact. Given room orientation, safety sheet signed. Instructed on how to use the call bell.

## 2016-01-26 NOTE — Progress Notes (Signed)
ANTICOAGULATION CONSULT NOTE - Initial Consult  Pharmacy Consult for Lovenox  Indication: pulmonary embolus  Allergies  Allergen Reactions  . Sulfa Antibiotics Hives and Itching  . Ciprofloxacin Hives and Itching  . Levofloxacin Hives and Itching  . Penicillins Hives and Itching    Patient Measurements: Height: 5\' 2"  (157.5 cm) Weight: 123 lb (55.8 kg) IBW/kg (Calculated) : 50.1 Heparin Dosing Weight: 54.9 kg   Vital Signs: Temp: 98.1 F (36.7 C) (01/06 0943) Temp Source: Oral (01/06 0943) BP: 122/41 (01/06 1143) Pulse Rate: 77 (01/06 0943)  Labs:  Recent Labs  01/25/16 1610 01/25/16 1901 01/26/16 0606  HGB 13.9  --  11.8*  HCT 42.9  --  35.1  PLT 222  --  189  LABPROT 13.0  --   --   INR 0.98  --   --   CREATININE 0.81  --  0.91  TROPONINI <0.03 <0.03  --     Estimated Creatinine Clearance: 37 mL/min (by C-G formula based on SCr of 0.91 mg/dL).   Medical History: Past Medical History:  Diagnosis Date  . Arthritis   . Bleeding disorder ([place])   . Chronic cystitis   . Collagen vascular disease ([place])   . CVA (cerebral infarction) 2003  . Dysuria   . Gross hematuria   . Hematuria   . Hemihypertrophy   . History of kidney stones   . Hyperlipidemia   . Hypertension   . Murmur, cardiac   . Myocardial infarction   . Peripheral vascular disease ([place])    s/p stent right leg  . Pulmonary nodule    stable on Chest CT March 2014, Followed at [facility]  . Recurrent UTI   . Sensory urge incontinence   . Thyroid disease   . Urinary frequency     Medications:  Prescriptions Prior to Admission  Medication Sig Dispense Refill Last Dose  . acetaminophen (TYLENOL) 325 MG tablet Take 325 mg by mouth every 4 (four) hours as needed for mild pain, moderate pain, fever or headache. Reported on 02/27/2015   PRN at PRN  . acetaminophen (TYLENOL) 650 MG CR tablet Take 650 mg by mouth 3 (three) times daily. Reported on 04/13/2015   01/25/2016 at 1400  . amLODipine (NORVASC) 5 MG  tablet Take 5 mg by mouth daily.   01/25/2016 at 0800  . bifidobacterium infantis (ALIGN) capsule Take 1 capsule by mouth daily.   01/25/2016 at 0800  . bisacodyl (DULCOLAX) 10 MG suppository Place 10 mg rectally daily as needed for moderate constipation.   PRN at PRN  . clopidogrel (PLAVIX) 75 MG tablet Take 75 mg by mouth daily.    01/25/2016 at 0800  . Cranberry 425 MG CAPS Take 425 mg by mouth daily.   01/25/2016 at 0800  . cyanocobalamin (,VITAMIN B-12,) 1000 MCG/ML injection Inject 1,000 mcg into the muscle every 30 (thirty) days. On the 2nd of each month   01/22/2016 at 0800  . dextromethorphan-guaiFENesin (ROBITUSSIN-DM) 10-100 MG/5ML liquid Take 10 mLs by mouth every 4 (four) hours as needed for cough.   PRN at PRN  . ergocalciferol (VITAMIN D2) 50000 units capsule Take 50,000 Units by mouth every 30 (thirty) days. Take on the 13th of every month   01/02/2016 at 0800  . levothyroxine (SYNTHROID, LEVOTHROID) 25 MCG tablet TAKE 1 TABLET (25 MCG TOTAL) BY MOUTH DAILY. (Patient taking differently: Take 25 mcg by mouth daily. ) 90 tablet 1 01/24/2016 at 2000  . loratadine (CLARITIN) 10 MG tablet Take 1  tablet (10 mg total) by mouth daily.   PRN at PRN  . magnesium hydroxide (MILK OF MAGNESIA) 400 MG/5ML suspension Take 30 mLs by mouth daily as needed for mild constipation or moderate constipation.   PRN at PRN  . menthol-cetylpyridinium (CEPACOL) 3 MG lozenge Take 1 lozenge by mouth as needed for sore throat.   PRN at PRN  . metoprolol succinate (TOPROL-XL) 25 MG 24 hr tablet Take 12.5 mg by mouth daily.    01/25/2016 at 0800  . nitrofurantoin, macrocrystal-monohydrate, (MACROBID) 100 MG capsule Take 100 mg by mouth daily.   01/25/2016 at 0800  . ondansetron (ZOFRAN) 4 MG tablet Take 4 mg by mouth every 8 (eight) hours as needed for nausea or vomiting.   01/25/2016 at 0800  . oxybutynin (DITROPAN) 5 MG tablet Take 0.5 tablets (2.5 mg total) by mouth 2 (two) times daily. 10 tablet 0 01/25/2016 at 0800  .  phenazopyridine (PYRIDIUM) 100 MG tablet Take 1 tablet (100 mg total) by mouth 3 (three) times daily as needed for pain. 12 tablet 0 01/24/2016 at 2000  . polyethylene glycol (MIRALAX / GLYCOLAX) packet Take 17 g by mouth daily as needed for mild constipation, moderate constipation or severe constipation. Reported on 02/27/2015   PRN at PRN  . pravastatin (PRAVACHOL) 40 MG tablet Take 1 tablet (40 mg total) by mouth every evening. 90 tablet 1 01/24/2016 at 2000  . senna-docusate (SENOKOT-S) 8.6-50 MG tablet Take 1 tablet by mouth 2 (two) times daily.   01/25/2016 at 0800  . sodium chloride (OCEAN) 0.65 % SOLN nasal spray Place 1 spray into both nostrils every 2 (two) hours as needed for congestion.   PRN at PRN  . traMADol (ULTRAM) 50 MG tablet Take 25 mg by mouth every 8 (eight) hours as needed for moderate pain.    01/24/2016 at 0800  . famotidine (PEPCID) 20 MG tablet Take 1 tablet (20 mg total) by mouth 2 (two) times daily. (Patient not taking: Reported on 01/25/2016) 60 tablet 1 Not Taking at Unknown time    Assessment: Pharmacy consulted to dose lovenox for PE in this 83 year old female.  No prior anticoag noted. Patient to transition to apixaban.      Plan:  Last dose of  Lovenox this AM. Will begin apixaban 10 mg bid x 7 days starting this PM followed by 5 mg bid.   Ulice Dash D 01/26/2016,12:54 PM

## 2016-01-26 NOTE — Consult Note (Signed)
[facility] Vascular Consult Note  MRN : [id number]  Krista Ellis is a 83 y.o. (Dec 03, 1930) female who presents with chief complaint of  Chief Complaint  Patient presents with  . Shortness of Breath  . Chest Pain  .  History of Present Illness: I am asked by Dr. Ara Kussmaul to see the patient regarding possible placement of an IVC filter.  The patient presents with several days of chest pain and shortness of breath. There was apparently also some question of hypoxia. She has an extensive vascular history and has undergone multiple previous lower extremity revascularizations. A last ICU was several years ago by Dr. Delana Meyer and she has also been seen at [facility]. Although she says her right leg is still weak and sometimes gives out on her, she does not have any ulcerations or symptoms that sound like ischemic rest pain. She has not had any recent swelling or new pain in her legs. She is reasonably immobile at baseline, but has not had any recent surgery, trauma, or injury. She had a previous history of pulmonary embolus secondary to a long-standing PICC line and resultant upper extremity DVT. She was on anticoagulation until this was stopped for a GI bleed almost a year ago. She has not had any recent fever or chills. The chest pain was left-sided and sharp, it was associated with some shortness of breath particularly with exertion. As part of a workup a CT scan of her chest as well as lower extremity duplex was performed which I have independently. Her chest CT shows a very small subsegmental pulmonary embolus in the right lung. Was not seen previously on a study last May. She has adenopathy in other findings which are reasonably stable. Her lower extremity venous duplex shows no evidence of DVT in either lower extremity.  Current Facility-Administered Medications  Medication Dose Route Frequency Provider Last Rate Last Dose  . acetaminophen (TYLENOL) tablet 325 mg   325 mg Oral Q4H PRN Alexis Hugelmeyer, DO      . acidophilus (RISAQUAD) capsule 1 capsule  1 capsule Oral Daily Alexis Hugelmeyer, DO   1 capsule at 01/26/16 0948  . amLODipine (NORVASC) tablet 5 mg  5 mg Oral Daily Alexis Hugelmeyer, DO   Stopped at 01/26/16 0947  . apixaban (ELIQUIS) tablet 10 mg  10 mg Oral BID Napoleon Form, RPH       Followed by  . [START ON 02/02/2016] apixaban (ELIQUIS) tablet 5 mg  5 mg Oral BID Napoleon Form, RPH      . bisacodyl (DULCOLAX) suppository 10 mg  10 mg Rectal Daily PRN Alexis Hugelmeyer, DO      . cefTRIAXone (ROCEPHIN) IVPB 1 g  1 g Intravenous Q24H Loletha Grayer, MD      . Chlorhexidine Gluconate Cloth 2 % PADS 6 each  6 each Topical Q0600 Alexis Hugelmeyer, DO      . dextromethorphan-guaiFENesin (ROBITUSSIN-DM) 10-100 MG/5ML liquid 10 mL  10 mL Oral Q4H PRN Alexis Hugelmeyer, DO      . levothyroxine (SYNTHROID, LEVOTHROID) tablet 25 mcg  25 mcg Oral QAC breakfast Alexis Hugelmeyer, DO   25 mcg at 01/26/16 0946  . loratadine (CLARITIN) tablet 10 mg  10 mg Oral Daily Alexis Hugelmeyer, DO   10 mg at 01/26/16 0946  . magnesium hydroxide (MILK OF MAGNESIA) suspension 30 mL  30 mL Oral Daily PRN Alexis Hugelmeyer, DO      . MEDLINE mouth rinse  15 mL Mouth Rinse  BID Loletha Grayer, MD   15 mL at 01/26/16 1145  . menthol-cetylpyridinium (CEPACOL) lozenge 3 mg  1 lozenge Oral PRN Alexis Hugelmeyer, DO      . metoprolol succinate (TOPROL-XL) 24 hr tablet 12.5 mg  12.5 mg Oral Daily Alexis Hugelmeyer, DO   12.5 mg at 01/26/16 0947  . mupirocin ointment (BACTROBAN) 2 % 1 application  1 application Nasal BID Alexis Hugelmeyer, DO   1 application at [id number]  . ondansetron (ZOFRAN) tablet 4 mg  4 mg Oral Q8H PRN Alexis Hugelmeyer, DO      . oxybutynin (DITROPAN) tablet 2.5 mg  2.5 mg Oral BID Alexis Hugelmeyer, DO   2.5 mg at 01/26/16 0949  . oxyCODONE (Oxy IR/ROXICODONE) immediate release tablet 5 mg  5 mg Oral Q4H PRN Alexis Hugelmeyer, DO   5 mg at  01/26/16 1153  . polyethylene glycol (MIRALAX / GLYCOLAX) packet 17 g  17 g Oral Daily PRN Alexis Hugelmeyer, DO      . pravastatin (PRAVACHOL) tablet 40 mg  40 mg Oral QPM Alexis Hugelmeyer, DO      . senna-docusate (Senokot-S) tablet 1 tablet  1 tablet Oral BID Alexis Hugelmeyer, DO   1 tablet at 01/26/16 0946  . sodium chloride (OCEAN) 0.65 % nasal spray 1 spray  1 spray Each Nare Q2H PRN Alexis Hugelmeyer, DO      . sodium chloride flush (NS) 0.9 % injection 3 mL  3 mL Intravenous Q12H Alexis Hugelmeyer, DO   3 mL at 01/26/16 0950  . traMADol (ULTRAM) tablet 25 mg  25 mg Oral Q8H PRN Harvie Bridge, DO        Past Medical History:  Diagnosis Date  . Arthritis   . Bleeding disorder ([place])   . Chronic cystitis   . Collagen vascular disease ([place])   . CVA (cerebral infarction) 2003  . Dysuria   . Gross hematuria   . Hematuria   . Hemihypertrophy   . History of kidney stones   . Hyperlipidemia   . Hypertension   . Murmur, cardiac   . Myocardial infarction   . Peripheral vascular disease ([place])    s/p stent right leg  . Pulmonary nodule    stable on Chest CT March 2014, Followed at [facility]  . Recurrent UTI   . Sensory urge incontinence   . Thyroid disease   . Urinary frequency     Past Surgical History:  Procedure Laterality Date  . ABDOMINAL HYSTERECTOMY  1982  . APPENDECTOMY    . CARDIAC SURGERY     2 failed stents in right leg  . CARPAL TUNNEL RELEASE    . HIP SURGERY  2007   replaced ball in right hip  . left rotator cuff     repair  . ruptured disc    . SPINE SURGERY     steel rod in back  . stent placement leg Right 2015  . TONSILLECTOMY    . TOTAL HIP ARTHROPLASTY     right  . TOTAL KNEE ARTHROPLASTY Left     Social History Social History  Substance Use Topics  . Smoking status: Never Smoker  . Smokeless tobacco: Never Used  . Alcohol use No  No IV drug use  Family History Family History  Problem Relation Age of Onset  . Hypertension Mother   .  Transient ischemic attack Mother   . Kidney disease Neg Hx   . Bladder Cancer Neg Hx   . Prostate cancer Neg  Hx   No bleeding or clotting disorders  Allergies  Allergen Reactions  . Sulfa Antibiotics Hives and Itching  . Ciprofloxacin Hives and Itching  . Levofloxacin Hives and Itching  . Penicillins Hives and Itching     REVIEW OF SYSTEMS (Negative unless checked)  Constitutional: [] Weight loss  [] Fever  [] Chills Cardiac: [x] Chest pain   [] Chest pressure   [x] Palpitations   [] Shortness of breath when laying flat   [] Shortness of breath at rest   [x] Shortness of breath with exertion. Vascular:  [] Pain in legs with walking   [] Pain in legs at rest   [] Pain in legs when laying flat   [x] Claudication   [x] Pain in feet when walking  [] Pain in feet at rest  [] Pain in feet when laying flat   [] History of DVT   [] Phlebitis   [] Swelling in legs   [] Varicose veins   [] Non-healing ulcers Pulmonary:   [] Uses home oxygen   [] Productive cough   [] Hemoptysis   [] Wheeze  [] COPD   [] Asthma Neurologic:  [] Dizziness  [] Blackouts   [] Seizures   [] History of stroke   [] History of TIA  [] Aphasia   [] Temporary blindness   [] Dysphagia   [] Weakness or numbness in arms   [] Weakness or numbness in legs Musculoskeletal:  [x] Arthritis   [] Joint swelling   [x] Joint pain   [] Low back pain Hematologic:  [] Easy bruising  [x] Easy bleeding   [] Hypercoagulable state   [] Anemic  [] Hepatitis Gastrointestinal:  [] Blood in stool   [] Vomiting blood  [] Gastroesophageal reflux/heartburn   [] Difficulty swallowing. Genitourinary:  [] Chronic kidney disease   [] Difficult urination  [] Frequent urination  [] Burning with urination   [] Blood in urine Skin:  [] Rashes   [] Ulcers   [] Wounds Psychological:  [] History of anxiety   []  History of major depression.  Physical Examination  Vitals:   01/26/16 0123 01/26/16 0554 01/26/16 0943 01/26/16 1143  BP: (!) 101/36 (!) 127/41 (!) 118/36 (!) 122/41  Pulse: 82 80 77   Resp: 18 18 16     Temp: 98.7 F (37.1 C) 98.1 F (36.7 C) 98.1 F (36.7 C)   TempSrc: Oral Oral Oral   SpO2: 94% 95% 97%   Weight: 55.8 kg (123 lb)     Height: 5\' 2"  (1.575 m)      Body mass index is 22.5 kg/m. Gen:  Frail, elderly WF, NAD Head: [place]/AT, No temporalis wasting. Prominent temp pulse not noted. Ear/Nose/Throat: Hearing grossly intact, nares w/o erythema or drainage, oropharynx w/o Erythema/Exudate Eyes: Sclera non-icteric, conjunctiva clear Neck: Trachea midline.  No JVD.  Pulmonary:  Good air movement, respirations not labored, equal bilaterally.  Cardiac: RRR, normal S1, S2. Vascular:  Vessel Right Left  Radial Palpable Palpable  Ulnar Palpable Palpable  Brachial Palpable Palpable  Carotid Palpable, without bruit Palpable, without bruit  Aorta Not palpable N/A  Femoral Palpable Palpable  Popliteal Not Palpable Not Palpable  PT Trace Palpable 1+ Palpable  DP Not Palpable Trace Palpable   Gastrointestinal: soft, non-tender/non-distended. No guarding/reflex.  Musculoskeletal: M/S 5/5 throughout.  Arthritic changes present. No edema. Neurologic: Sensation grossly intact in extremities.  Symmetrical.  Speech is fluent. Motor exam as listed above. Psychiatric: Judgment intact, Mood & affect appropriate for pt's clinical situation. Dermatologic: No rashes or ulcers noted.  No cellulitis or open wounds. Lymph : No Cervical, Axillary, or Inguinal lymphadenopathy.      CBC Lab Results  Component Value Date   WBC 17.0 (H) 01/26/2016   HGB 11.8 (L) 01/26/2016   HCT 35.1 01/26/2016  MCV 94.7 01/26/2016   PLT 189 01/26/2016    BMET    Component Value Date/Time   NA 137 01/26/2016 0606   NA 138 05/05/2014 1802   K 4.2 01/26/2016 0606   K 4.7 05/05/2014 1802   CL 107 01/26/2016 0606   CL 106 05/05/2014 1802   CO2 24 01/26/2016 0606   CO2 25 05/05/2014 1802   GLUCOSE 94 01/26/2016 0606   GLUCOSE 104 (H) 05/05/2014 1802   BUN 26 (H) 01/26/2016 0606   BUN 15 05/05/2014  1802   CREATININE 0.91 01/26/2016 0606   CREATININE 0.58 05/05/2014 1802   CALCIUM 9.1 01/26/2016 0606   CALCIUM 9.7 05/05/2014 1802   GFRNONAA 57 (L) 01/26/2016 0606   GFRNONAA >60 05/05/2014 1802   GFRAA >60 01/26/2016 0606   GFRAA >60 05/05/2014 1802   Estimated Creatinine Clearance: 37 mL/min (by C-G formula based on SCr of 0.91 mg/dL).  COAG Lab Results  Component Value Date   INR 0.98 01/25/2016   INR 0.99 09/10/2014   INR 0.98 05/18/2014    Radiology Dg Chest 2 View  Result Date: 01/25/2016 CLINICAL DATA:  83 year old female with acute onset chest pressure jaw pain and shortness of breath. Hypoxic on room arrow. Initial encounter. EXAM: CHEST  2 VIEW COMPARISON:  CTA chest 06/01/2015 and earlier. FINDINGS: Seated AP and lateral views of the chest. Stable lung volumes. Stable cardiac size at the upper limits of normal. Other mediastinal contours are within normal limits. Visualized tracheal air column is within normal limits. Stable mild bilateral increased interstitial markings with no pneumothorax, pulmonary edema, pleural effusion or acute pulmonary opacity. Osteopenia. Stable visualized osseous structures. Calcified aortic atherosclerosis. Negative visible bowel gas pattern. IMPRESSION: No acute cardiopulmonary abnormality. Calcified aortic atherosclerosis. Electronically Signed   By: Genevie Ann M.D.   On: 01/25/2016 17:03   Ct Angio Chest Pe W Or Wo Contrast  Result Date: 01/25/2016 CLINICAL DATA:  Chest pain and shortness of breath EXAM: CT ANGIOGRAPHY CHEST WITH CONTRAST TECHNIQUE: Multidetector CT imaging of the chest was performed using the standard protocol during bolus administration of intravenous contrast. Multiplanar CT image reconstructions and MIPs were obtained to evaluate the vascular anatomy. CONTRAST:  75 mL Isovue 370 nonionic COMPARISON:  Chest CT Jun 01, 2015 chest radiograph January 25, 2016 FINDINGS: Cardiovascular: There is a small pulmonary embolus in a branch of  the anterior segment right upper lobe pulmonary artery. No more central pulmonary embolus identified. The right ventricle to left ventricle diameter does not show evidence of right heart strain. There is no demonstrable thoracic aortic aneurysm or dissection. There is mild calcification in the proximal left subclavian artery. There is also mild calcification in the proximal right common carotid artery. Visualized great vessels otherwise appear normal. There are scattered foci of atherosclerotic calcification in the aorta. There are scattered foci of coronary artery calcification. There is no appreciable pericardial effusion. There is calcification in the aortic valve, stable. Mediastinum/Nodes: There are nodular opacities in the thyroid, largest measuring approximately 1 cm. There is a lymph node in the aortopulmonary window region measuring 1.1 x 1.1 cm, stable. There is a lymph node in the sub- carinal region measuring 1.5 x 1.5 cm, stable. Several smaller mediastinal lymph nodes are stable. No new lymph node prominence evident. Lungs/Pleura: On axial slice 41 series 6, there is a 4 mm nodular opacity in the anterior segment of the right upper lobe. On axial slice 47 series 6, there is a stable 6 x 5  mm nodular opacity in the posterior segment of the left upper lobe. On axial slice 51 series 6, there is a stable 6 x 5 mm nodular opacity with a nearby 5 x 4 mm nodular opacity in this area. On axial slice 49 series 6, there is a 4 mm nodular opacity in the lingula anteriorly. There is a nodular opacity more posteriorly in the lingula on axial slice 49 series 5 measuring 4 mm. There is a nearby 3 mm nodular opacity in this area as well, stable. If there is a stable 3 mm nodular opacity in the inferior lingula anteriorly near the left heart border. There is patchy bibasilar atelectasis. There are areas of probable air trapping from small airways disease, primarily in the right lower lobe anteriorly and medially. There  is no frank edema or consolidation. No pleural effusion or pleural thickening evident. Upper Abdomen: In the visualized upper abdomen, the left adrenal appears somewhat hypertrophied. There is atherosclerotic calcification in the aorta and major branch vessels. Musculoskeletal: There is degenerative change in the thoracic spine. There is no blastic or lytic bone lesion. Review of the MIP images confirms the above findings. IMPRESSION: Small pulmonary embolus in a peripheral branch of the anterior segment right upper lobe pulmonary artery. No larger pulmonary emboli evident. No appreciable right heart strain. Multiples areas of atherosclerotic calcification including multiple foci of coronary artery calcification. Several mildly prominent lymph nodes, stable. Several pulmonary nodular opacities as noted above. Patchy bibasilar atelectasis. Probable small airways disease, primarily on the right. No frank edema or consolidation. Multinodular goiter without dominant thyroid mass evident. Evidence a degree of left adrenal hypertrophy. Critical Value/emergent results were called by telephone at the time of interpretation on 01/25/2016 at 9:06 pm to Dr. Meade Maw , who verbally acknowledged these results. Electronically Signed   By: Lowella Grip III M.D.   On: 01/25/2016 21:08   US Venous Img Lower Bilateral  Result Date: 01/26/2016 CLINICAL DATA:  Question small right upper lobe pulmonary embolus. EXAM: BILATERAL LOWER EXTREMITY VENOUS DOPPLER ULTRASOUND TECHNIQUE: Gray-scale sonography with graded compression, as well as color Doppler and duplex ultrasound were performed to evaluate the lower extremity deep venous systems from the level of the common femoral vein and including the common femoral, femoral, profunda femoral, popliteal and calf veins including the posterior tibial, peroneal and gastrocnemius veins when visible. The superficial great saphenous vein was also interrogated. Spectral Doppler was  utilized to evaluate flow at rest and with distal augmentation maneuvers in the common femoral, femoral and popliteal veins. COMPARISON:  01/25/2016 FINDINGS: RIGHT LOWER EXTREMITY Common Femoral Vein: No evidence of thrombus. Normal compressibility, respiratory phasicity and response to augmentation. Saphenofemoral Junction: No evidence of thrombus. Normal compressibility and flow on color Doppler imaging. Profunda Femoral Vein: No evidence of thrombus. Normal compressibility and flow on color Doppler imaging. Femoral Vein: No evidence of thrombus. Normal compressibility, respiratory phasicity and response to augmentation. Popliteal Vein: No evidence of thrombus. Normal compressibility, respiratory phasicity and response to augmentation. Calf Veins: Limited assessment of the calf veins. No significant occlusive thrombus visualized. Superficial Great Saphenous Vein: No evidence of thrombus. Normal compressibility and flow on color Doppler imaging. Venous Reflux:  None. Other Findings:  None. LEFT LOWER EXTREMITY Common Femoral Vein: No evidence of thrombus. Normal compressibility, respiratory phasicity and response to augmentation. Saphenofemoral Junction: No evidence of thrombus. Normal compressibility and flow on color Doppler imaging. Profunda Femoral Vein: No evidence of thrombus. Normal compressibility and flow on color Doppler imaging. Femoral  Vein: No evidence of thrombus. Normal compressibility, respiratory phasicity and response to augmentation. Popliteal Vein: No evidence of thrombus. Normal compressibility, respiratory phasicity and response to augmentation. Calf Veins: No evidence of thrombus. Normal compressibility and flow on color Doppler imaging. Superficial Great Saphenous Vein: No evidence of thrombus. Normal compressibility and flow on color Doppler imaging. Venous Reflux:  None. Other Findings:  None. IMPRESSION: No significant DVT in either lower extremity. Limited assessment of the calf veins  on the right. Electronically Signed   By: Jerilynn Mages.  Shick M.D.   On: 01/26/2016 11:20      Assessment/Plan 1. Pulmonary embolus with previous history of GI bleed about a year ago. This may represent a recurrent pulmonary embolus although her first one was likely secondary to PICC line's that had remained in. CT scan of her chest as well as lower extremity duplex was performed which I have independently. Her chest CT shows a very small subsegmental pulmonary embolus in the right lung. Was not seen previously on a study last May. She has adenopathy in other findings which are reasonably stable. Her lower extremity venous duplex shows no evidence of DVT in either lower extremity. This pulmonary embolus is extremely small and likely not of much clinical significance. She does not have any lower extremity DVT residual, and with an extremely small pulmonary embolus and no DVT there is not much role for IVC filter in her case. It is debatable whether or not anticoagulation would be appropriate for her. I believe adding an 81 mg aspirin to her current Plavix therapy would be a reasonable option. Also reasonable be using a half dose of 2.5 mg of Eliquis twice daily.  Either approach would seem reasonable.   2. Peripheral arterial disease with claudication. Status post multiple previous interventions with no recent follow-up. Consider outpatient follow-up with Dr. Delana Meyer after discharge for further evaluation. No indication for revascularization currently without clear lifestyle limiting or limb threatening symptoms. 3. Hypertension. Stable on outpatient medications. 4. Hyperlipidemia. Continue statin therapy.   Leotis Pain, MD  01/26/2016 3:35 PM    This note was created with Dragon medical transcription system.  Any error is purely unintentional

## 2016-01-27 LAB — BASIC METABOLIC PANEL
Anion gap: 6 (ref 5–15)
BUN: 16 mg/dL (ref 6–20)
CO2: 26 mmol/L (ref 22–32)
Calcium: 9.1 mg/dL (ref 8.9–10.3)
Chloride: 107 mmol/L (ref 101–111)
Creatinine, Ser: 0.63 mg/dL (ref 0.44–1.00)
GFR calc Af Amer: >60 mL/min (ref >60)
Glucose, Bld: 84 mg/dL (ref 65–99)
POTASSIUM: 4.2 mmol/L (ref 3.5–5.1)
SODIUM: 139 mmol/L (ref 135–145)

## 2016-01-27 LAB — CBC
HCT: 34.9 % — ABNORMAL LOW (ref 35.0–47.0)
Hemoglobin: 11.8 g/dL — ABNORMAL LOW (ref 12.0–16.0)
MCH: 32 pg (ref 26.0–34.0)
MCHC: 33.7 g/dL (ref 32.0–36.0)
MCV: 95.1 fL (ref 80.0–100.0)
Platelets: 165 10*3/uL (ref 150–440)
RBC: 3.67 MIL/uL — AB (ref 3.80–5.20)
RDW: 13.2 % (ref 11.5–14.5)
WBC: 7.9 10*3/uL (ref 3.6–11.0)

## 2016-01-27 LAB — URINE CULTURE

## 2016-01-27 MED ORDER — CEPHALEXIN 500 MG PO CAPS: 500.0000 mg | ORAL_CAPSULE | Freq: Three times a day (TID) | ORAL | 0 refills | Status: DC

## 2016-01-27 MED ORDER — TRAMADOL HCL 50 MG PO TABS: 25.0000 mg | ORAL_TABLET | Freq: Three times a day (TID) | ORAL | 0 refills | Status: DC | PRN

## 2016-01-27 MED ORDER — APIXABAN 5 MG PO TABS
ORAL_TABLET | ORAL | 0 refills | Status: DC
Start: 2016-01-27 — End: 2021-11-19

## 2016-01-27 NOTE — Clinical Social Work Note (Signed)
Patient to discharge to [facility] via non-emergent EMS. CSW met patient and her daughters at bedside, and they are in agreement with the dc plan. The facility is aware and able to accept. CSW will con't to follow pending additional dc needs.  Santiago Bumpers, MSW, LCSW-A [phone]

## 2016-01-27 NOTE — NC FL2 (Signed)
[place] LEVEL OF CARE SCREENING TOOL     IDENTIFICATION  Patient Name: Krista Ellis Birthdate: [birth year] Sex: female Admission Date (Current Location): 01/25/2016  [facility] and [place] Number:  Engineering geologist and Address:  [facility], [address] [place], [place] [address]      Provider Number: [id number]  Attending Physician Name and Address:  Loletha Grayer, MD  Relative Name and Phone Number:       Current Level of Care: Hospital Recommended Level of Care:   Prior Approval Number:    Date Approved/Denied:   PASRR Number:    Discharge Plan: SNF    Current Diagnoses: Patient Active Problem List   Diagnosis Date Noted  . Acid reflux 04/13/2015  . Combined fat and carbohydrate induced hyperlipemia 04/13/2015  . Dysuria 03/01/2015  . Atrophic vaginitis 03/01/2015  . Microscopic hematuria 03/01/2015  . Bladder spasms 03/01/2015  . Pulmonary embolism ([place]) 09/22/2014  . UTI (urinary tract infection) 09/14/2014  . Enterococcus UTI 09/14/2014  . Generalized weakness 09/14/2014  . Sepsis ([place]) 09/10/2014  . Open wound of knee, leg (except thigh), and ankle, complicated [id number]  . Buttock pain 07/19/2014  . Hospital discharge follow-up 07/04/2014  . Facial droop   . Malnutrition of moderate degree ([place]) 05/21/2014  . Seizures ([place])   . TIA (transient ischemic attack) 05/18/2014  . Systolic murmur [id number]  . Gait disturbance 05/15/2014  . Benign essential HTN 05/08/2014  . History of subdural hematoma   . Subdural hematoma ([place]) 05/05/2014  . B12 deficiency 03/09/2014  . Bradycardia 02/24/2014  . Arteriosclerosis of coronary artery 02/23/2014  . Recurrent UTI 01/03/2014  . Bilateral hand numbness 01/03/2014  . Nephrolithiasis 11/18/2013  . Recurrent falls 11/18/2013  . Closed rib fracture 09/25/2013  . Abnormal CT scan, chest 09/01/2013  . Chest wall pain 08/31/2013  . Hematoma and contusion  08/31/2013  . Atypical chest pain 06/17/2013  . Peripheral vascular disease ([place]) 06/17/2013  . Heart valve disease 06/17/2013  . Atherosclerotic peripheral vascular disease ([place]) 06/01/2013  . Bladder wall hemorrhage 12/10/2012  . Dysphagia, pharyngoesophageal phase 12/10/2012  . Malaise and fatigue 12/10/2012  . Frank hematuria 12/02/2012  . Bladder retention 12/02/2012  . Postmenopausal estrogen deficiency 11/30/2012  . Glucose found in urine on examination 11/30/2012  . Post menopausal syndrome 11/30/2012  . Type 2 diabetes mellitus with hyperglycemia ([place]) 11/24/2012  . Lung nodule, solitary 03/12/2012  . Diverticular disease of large intestine 12/25/2011  . 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 Symptoms involving urinary system 12/04/2011  . Medicare annual wellness visit, subsequent 10/01/2011  . Dyslipidemia 10/01/2011  . HLD (hyperlipidemia) 10/01/2011  . Encounter for general adult medical examination without abnormal findings 10/01/2011  . Bladder infection, chronic 09/29/2011  . Incomplete bladder emptying 09/29/2011  . Urge incontinence of urine 09/29/2011  . Delayed onset of urination 09/29/2011  . Hypertension 10/17/2010  . Hypothyroidism 10/17/2010  . Osteoarthritis 10/17/2010  . Essential (primary) hypertension 10/17/2010  . Arthritis, degenerative 10/17/2010  . CN (constipation) 05/02/2010    Orientation RESPIRATION BLADDER Height & Weight     Self, Time, Situation, Place  Normal Incontinent Weight: 123 lb (55.8 kg) Height:  5\' 2"  (157.5 cm)  BEHAVIORAL SYMPTOMS/MOOD NEUROLOGICAL BOWEL NUTRITION STATUS      Continent Diet (Cardiac diet)  AMBULATORY STATUS COMMUNICATION OF NEEDS Skin   Extensive Assist Verbally Normal  Personal Care Assistance Level of Assistance  Bathing, Dressing Bathing Assistance: Maximum assistance   Dressing Assistance: Maximum assistance     Functional Limitations Info              SPECIAL CARE FACTORS FREQUENCY                       Contractures Contractures Info: Present    Additional Factors Info  Code Status, Allergies Code Status Info: DNR Allergies Info: Sulfa Antibiotics, Ciprofloxacin, Levofloxacin, Penicillins           Current Medications (01/27/2016):  This is the current hospital active medication list Current Facility-Administered Medications  Medication Dose Route Frequency Provider Last Rate Last Dose  . acetaminophen (TYLENOL) tablet 325 mg  325 mg Oral Q4H PRN Alexis Hugelmeyer, DO      . acidophilus (RISAQUAD) capsule 1 capsule  1 capsule Oral Daily Alexis Hugelmeyer, DO   1 capsule at 01/27/16 0836  . amLODipine (NORVASC) tablet 5 mg  5 mg Oral Daily Alexis Hugelmeyer, DO   5 mg at 01/27/16 0835  . apixaban (ELIQUIS) tablet 10 mg  10 mg Oral BID Napoleon Form, RPH   10 mg at 01/27/16 [id number]   Followed by  . [START ON 02/02/2016] apixaban (ELIQUIS) tablet 5 mg  5 mg Oral BID Napoleon Form, RPH      . bisacodyl (DULCOLAX) suppository 10 mg  10 mg Rectal Daily PRN Alexis Hugelmeyer, DO      . cefTRIAXone (ROCEPHIN) IVPB 1 g  1 g Intravenous Q24H Loletha Grayer, MD   1 g at 01/26/16 1802  . Chlorhexidine Gluconate Cloth 2 % PADS 6 each  6 each Topical Q0600 Alexis Hugelmeyer, DO   6 each at 01/27/16 0549  . dextromethorphan-guaiFENesin (ROBITUSSIN-DM) 10-100 MG/5ML liquid 10 mL  10 mL Oral Q4H PRN Alexis Hugelmeyer, DO      . levothyroxine (SYNTHROID, LEVOTHROID) tablet 25 mcg  25 mcg Oral QAC breakfast Alexis Hugelmeyer, DO   25 mcg at 01/27/16 0835  . loratadine (CLARITIN) tablet 10 mg  10 mg Oral Daily Alexis Hugelmeyer, DO   10 mg at 01/27/16 0835  . magnesium hydroxide (MILK OF MAGNESIA) suspension 30 mL  30 mL Oral Daily PRN Alexis Hugelmeyer, DO      . MEDLINE mouth rinse  15 mL Mouth Rinse BID Loletha Grayer, MD   15 mL at 01/27/16 1000  . menthol-cetylpyridinium (CEPACOL) lozenge 3 mg  1 lozenge Oral PRN Alexis  Hugelmeyer, DO   3 mg at 01/27/16 0708  . metoprolol succinate (TOPROL-XL) 24 hr tablet 12.5 mg  12.5 mg Oral Daily Alexis Hugelmeyer, DO   12.5 mg at 01/27/16 0835  . mupirocin ointment (BACTROBAN) 2 % 1 application  1 application Nasal BID Alexis Hugelmeyer, DO   1 application at [id number]  . ondansetron (ZOFRAN) tablet 4 mg  4 mg Oral Q8H PRN Alexis Hugelmeyer, DO      . oxybutynin (DITROPAN) tablet 2.5 mg  2.5 mg Oral BID Alexis Hugelmeyer, DO   2.5 mg at 01/27/16 0836  . oxyCODONE (Oxy IR/ROXICODONE) immediate release tablet 5 mg  5 mg Oral Q4H PRN Alexis Hugelmeyer, DO   5 mg at 01/27/16 0708  . polyethylene glycol (MIRALAX / GLYCOLAX) packet 17 g  17 g Oral Daily PRN Alexis Hugelmeyer, DO      . pravastatin (PRAVACHOL) tablet 40 mg  40 mg Oral QPM Alexis Hugelmeyer, DO   40 mg  at 01/26/16 1803  . senna-docusate (Senokot-S) tablet 1 tablet  1 tablet Oral BID Alexis Hugelmeyer, DO   1 tablet at 01/27/16 0836  . sodium chloride (OCEAN) 0.65 % nasal spray 1 spray  1 spray Each Nare Q2H PRN Alexis Hugelmeyer, DO      . sodium chloride flush (NS) 0.9 % injection 3 mL  3 mL Intravenous Q12H Alexis Hugelmeyer, DO   3 mL at 01/27/16 0837  . traMADol (ULTRAM) tablet 25 mg  25 mg Oral Q8H PRN Alexis Hugelmeyer, DO         Discharge Medications: Please see discharge summary for a list of discharge medications.  Relevant Imaging Results:  Relevant Lab Results:   Additional Information SS# [id number]  Zettie Pho, LCSW

## 2016-01-27 NOTE — Discharge Instructions (Signed)
Pulmonary Embolism °A pulmonary embolism (PE) is a sudden blockage or decrease of blood flow in one lung or both lungs. Most blockages come from a blood clot that travels from the legs or the pelvis to the lungs. PE is a dangerous and potentially life-threatening condition if it is not treated right away. °What are the causes? °A pulmonary embolism occurs most commonly when a blood clot travels from one of your veins to your lungs. Rarely, PE is caused by air, fat, amniotic fluid, or part of a tumor traveling through your veins to your lungs. °What increases the risk? °A PE is more likely to develop in: °· People who smoke. °· People who are older, especially over 60 years of age. °· People who are overweight (obese). °· People who sit or lie still for a long time, such as during long-distance travel (over 4 hours), bed rest, hospitalization, or during recovery from certain medical conditions like a stroke. °· People who do not engage in much physical activity (sedentary lifestyle). °· People who have chronic breathing disorders. °· People who have a personal or family history of blood clots or blood clotting disease. °· People who have peripheral vascular disease (PVD), diabetes, or some types of cancer. °· People who have heart disease, especially if the person had a recent heart attack or has congestive heart failure. °· People who have neurological diseases that affect the legs (leg paresis). °· People who have had a traumatic injury, such as breaking a hip or leg. °· People who have recently had major or lengthy surgery, especially on the hip, knee, or abdomen. °· People who have had a central line placed inside a large vein. °· People who take medicines that contain the hormone estrogen. These include birth control pills and hormone replacement therapy. °· Pregnancy or during childbirth or the postpartum period. ° °What are the signs or symptoms? °The symptoms of a PE usually start suddenly and  include: °· Shortness of breath while active or at rest. °· Coughing or coughing up blood or blood-tinged mucus. °· Chest pain that is often worse with deep breaths. °· Rapid or irregular heartbeat. °· Feeling light-headed or dizzy. °· Fainting. °· Feeling anxious. °· Sweating. ° °There may also be pain and swelling in a leg if that is where the blood clot started. °These symptoms may represent a serious problem that is an emergency. Do not wait to see if the symptoms will go away. Get medical help right away. Call your local emergency services (911 in the [place] Do not drive yourself to the hospital. °How is this diagnosed? °Your health care provider will take a medical history and perform a physical exam. You may also have other tests, including: °· Blood tests to assess the clotting properties of your blood, assess oxygen levels in your blood, and find blood clots. °· Imaging tests, such as CT, ultrasound, MRI, X-ray, and other tests to see if you have clots anywhere in your body. °· An electrocardiogram (ECG) to look for heart strain from blood clots in the lungs. ° °How is this treated? °The main goals of PE treatment are: °· To stop a blood clot from growing larger. °· To stop new blood clots from forming. ° °The type of treatment that you receive depends on many factors, such as the cause of your PE, your risk for bleeding or developing more clots, and other medical conditions that you have. Sometimes, a combination of treatments is necessary. °This condition may be treated with: °· Medicines, including newer oral blood thinners (  anticoagulants), warfarin, low molecular weight heparins, thrombolytics, or heparins. °· Wearing compression stockings or using different types of devices. °· Surgery (rare) to remove the blood clot or to place a filter in your abdomen to stop the blood clot from traveling to your lungs. ° °Treatments for a PE are often divided into immediate treatment, long-term treatment (up to 3  months after PE), and extended treatment (more than 3 months after PE). Your treatment may continue for several months. This is called maintenance therapy, and it is used to prevent the forming of new blood clots. You can work with your health care provider to choose the treatment program that is best for you. °What are anticoagulants? °Anticoagulants are medicines that treat PEs. They can stop current blood clots from growing and stop new clots from forming. They cannot dissolve existing clots. Your body dissolves clots by itself over time. Anticoagulants are given by mouth, by injection, or through an IV tube. °What are thrombolytics? °Thrombolytics are clot-dissolving medicines that are used to dissolve a PE. They carry a high risk of bleeding, so they tend to be used only in severe cases or if you have very low blood pressure. °Follow these instructions at home: °If you are taking a newer oral anticoagulant: °· Take the medicine every single day at the same time each day. °· Understand what foods and drugs interact with this medicine. °· Understand that there are no regular blood tests required when using this medicine. °· Understand the side effects of this medicine, including excessive bruising or bleeding. Ask your health care provider or pharmacist about other possible side effects. °If you are taking warfarin: °· Understand how to take warfarin and know which foods can affect how warfarin works in your body. °· Understand that it is dangerous to take too much or too little warfarin. Too much warfarin increases the risk of bleeding. Too little warfarin continues to allow the risk for blood clots. °· Follow your PT and INR blood testing schedule. The PT and INR results allow your health care provider to adjust your dose of warfarin. It is very important that you have your PT and INR tested as often as told by your health care provider. °· Avoid major changes in your diet, or tell your health care provider  before you change your diet. Arrange a visit with a registered dietitian to answer your questions. Many foods, especially foods that are high in vitamin K, can interfere with warfarin and affect the PT and INR results. Eat a consistent amount of foods that are high in vitamin K, such as: °? Spinach, kale, broccoli, cabbage, collard greens, turnip greens, Brussels sprouts, peas, cauliflower, seaweed, and parsley. °? Beef liver and pork liver. °? Green tea. °? Soybean oil. °· Tell your health care provider about any and all medicines, vitamins, and supplements that you take, including aspirin and other over-the-counter anti-inflammatory medicines. Be especially cautious with aspirin and anti-inflammatory medicines. Do not take those before you ask your health care provider if it is safe to do so. This is important because many medicines can interfere with warfarin and affect the PT and INR results. °· Do not start or stop taking any over-the-counter or prescription medicine unless your health care provider or pharmacist tells you to do so. °If you take warfarin, you will also need to do these things: °· Hold pressure over cuts for longer than usual. °· Tell your dentist and other health care providers that you are taking warfarin before you have   any procedures in which bleeding may occur. °· Avoid alcohol or drink very small amounts. Tell your health care provider if you change your alcohol intake. °· Do not use tobacco products, including cigarettes, chewing tobacco, and e-cigarettes. If you need help quitting, ask your health care provider. °· Avoid contact sports. ° °General instructions °· Take over-the-counter and prescription medicines only as told by your health care provider. Anticoagulant medicines can have side effects, including easy bruising and difficulty stopping bleeding. If you are prescribed an anticoagulant, you will also need to do these things: °? Hold pressure over cuts for longer than  usual. °? Tell your dentist and other health care providers that you are taking anticoagulants before you have any procedures in which bleeding may occur. °? Avoid contact sports. °· Wear a medical alert bracelet or carry a medical alert card that says you have had a PE. °· Ask your health care provider how soon you can go back to your normal activities. Stay active to prevent new blood clots from forming. °· Make sure to exercise while traveling or when you have been sitting or standing for a long period of time. It is very important to exercise. Exercise your legs by walking or by tightening and relaxing your leg muscles often. Take frequent walks. °· Wear compression stockings as told by your health care provider to help prevent more blood clots from forming. °· Do not use tobacco products, including cigarettes, chewing tobacco, and e-cigarettes. If you need help quitting, ask your health care provider. °· Keep all follow-up appointments with your health care provider. This is important. °How is this prevented? °Take these actions to decrease your risk of developing another PE: °· Exercise regularly. For at least 30 minutes every day, engage in: °? Activity that involves moving your arms and legs. °? Activity that encourages good blood flow through your body by increasing your heart rate. °· Exercise your arms and legs every hour during long-distance travel (over 4 hours). Drink plenty of water and avoid drinking alcohol while traveling. °· Avoid sitting or lying in bed for long periods of time without moving your legs. °· Maintain a weight that is appropriate for your height. Ask your health care provider what weight is healthy for you. °· If you are a woman who is over 35 years of age, avoid unnecessary use of medicines that contain estrogen. These include birth control pills. °· Do not smoke, especially if you take estrogen medicines. If you need help quitting, ask your health care provider. °· If you are at  very high risk for PE, wear compression stockings. °· If you recently had a PE, have regularly scheduled ultrasound testing on your legs to check for new blood clots. ° °If you are hospitalized, prevention measures may include: °· Early walking after surgery, as soon as your health care provider says that it is safe. °· Receiving anticoagulants to prevent blood clots. If you cannot take anticoagulants, other options may be available, such as wearing compression stockings or using different types of devices. ° °Get help right away if: °· You have new or increased pain, swelling, or redness in an arm or leg. °· You have numbness or tingling in an arm or leg. °· You have shortness of breath while active or at rest. °· You have chest pain. °· You have a rapid or irregular heartbeat. °· You feel light-headed or dizzy. °· You cough up blood. °· You notice blood in your vomit, bowel movement, or   urine. °· You have a fever. °These symptoms may represent a serious problem that is an emergency. Do not wait to see if the symptoms will go away. Get medical help right away. Call your local emergency services (911 in the [place] Do not drive yourself to the hospital. °This information is not intended to replace advice given to you by your health care provider. Make sure you discuss any questions you have with your health care provider. °Document Released: 01/04/2000 Document Revised: 06/14/2015 Document Reviewed: 05/03/2014 °Elsevier Interactive Patient Education © 2017 [employer] ° °

## 2016-01-27 NOTE — Progress Notes (Signed)
IV and tele removed from patient. EMS arrived to transport patient to [facility], patient packet given to EMS. Patient in no acute distress at this time.

## 2016-01-27 NOTE — Discharge Summary (Signed)
[facility] [place] NAME: Krista Ellis    MR#:  [id number]  DATE OF BIRTH:  [birth year]  DATE OF ADMISSION:  01/25/2016 ADMITTING PHYSICIAN: Harvie Bridge, DO  DATE OF DISCHARGE: 81/07/2015  PRIMARY CARE PHYSICIAN: Viviana Simpler, MD    ADMISSION DIAGNOSIS:  Shortness of breath [R06.02] Chest pain [R07.9]  DISCHARGE DIAGNOSIS:  Active Problems:   Pulmonary embolism (HCC)   SECONDARY DIAGNOSIS:   Past Medical History:  Diagnosis Date  . Arthritis   . Bleeding disorder ([place])   . Chronic cystitis   . Collagen vascular disease ([place])   . CVA (cerebral infarction) 2003  . Dysuria   . Gross hematuria   . Hematuria   . Hemihypertrophy   . History of kidney stones   . Hyperlipidemia   . Hypertension   . Murmur, cardiac   . Myocardial infarction   . Peripheral vascular disease ([place])    s/p stent right leg  . Pulmonary nodule    stable on Chest CT March 2014, Followed at [facility]  . Recurrent UTI   . Sensory urge incontinence   . Thyroid disease   . Urinary frequency     HOSPITAL COURSE:   1. Acute pulmonary embolism. Ultrasound the lower extremity was negative for DVT. Patient initially was started on high-dose Lovenox. I switched over to Eliquis. Patient is stable for discharge back to [facility] today. Risks of blood thinner explained to patient and family and this includes risk of bleeding. Recommend following up hemoglobin every couple weeks. Family states that she has a history of a DVT in the past which would make dictation a candidate for lifelong anticoagulation as long as she can tolerate it. 2. Acute encephalopathy with acute cystitis with hematuria. Patient received IV Rocephin for 2 doses. Urine culture showing mixed contamination. I will give Keflex for a few more days and then can go back on the patient's usual nitrofurantoin after that. 3. Hypothyroidism unspecified on levothyroxine 4. Essential hypertension on  metoprolol and Norvasc. 5. Hyperlipidemia unspecified on pravastatin 6. Urinary frequency on oxybutynin  DISCHARGE CONDITIONS:   Satisfactory  CONSULTS OBTAINED:  Treatment Team:  Algernon Huxley, MD  DRUG ALLERGIES:   Allergies  Allergen Reactions  . Sulfa Antibiotics Hives and Itching  . Ciprofloxacin Hives and Itching  . Levofloxacin Hives and Itching  . Penicillins Hives and Itching    DISCHARGE MEDICATIONS:   Current Discharge Medication List    START taking these medications   Details  apixaban (ELIQUIS) 5 MG TABS tablet Two tablets twice a day for six days then one tablet twice a day afterwards Qty: 72 tablet, Refills: 0    cephALEXin (KEFLEX) 500 MG capsule Take 1 capsule (500 mg total) by mouth 3 (three) times daily. Qty: 9 capsule, Refills: 0      CONTINUE these medications which have CHANGED   Details  traMADol (ULTRAM) 50 MG tablet Take 0.5 tablets (25 mg total) by mouth every 8 (eight) hours as needed for moderate pain. Qty: 30 tablet, Refills: 0      CONTINUE these medications which have NOT CHANGED   Details  acetaminophen (TYLENOL) 325 MG tablet Take 325 mg by mouth every 4 (four) hours as needed for mild pain, moderate pain, fever or headache. Reported on 02/27/2015    acetaminophen (TYLENOL) 650 MG CR tablet Take 650 mg by mouth 3 (three) times daily. Reported on 04/13/2015    amLODipine (NORVASC) 5 MG tablet Take  5 mg by mouth daily.    bifidobacterium infantis (ALIGN) capsule Take 1 capsule by mouth daily.    bisacodyl (DULCOLAX) 10 MG suppository Place 10 mg rectally daily as needed for moderate constipation.    Cranberry 425 MG CAPS Take 425 mg by mouth daily.    cyanocobalamin (,VITAMIN B-12,) 1000 MCG/ML injection Inject 1,000 mcg into the muscle every 30 (thirty) days. On the 2nd of each month    dextromethorphan-guaiFENesin (ROBITUSSIN-DM) 10-100 MG/5ML liquid Take 10 mLs by mouth every 4 (four) hours as needed for cough.    ergocalciferol  (VITAMIN D2) 50000 units capsule Take 50,000 Units by mouth every 30 (thirty) days. Take on the 13th of every month    levothyroxine (SYNTHROID, LEVOTHROID) 25 MCG tablet TAKE 1 TABLET (25 MCG TOTAL) BY MOUTH DAILY. Qty: 90 tablet, Refills: 1    loratadine (CLARITIN) 10 MG tablet Take 1 tablet (10 mg total) by mouth daily.    magnesium hydroxide (MILK OF MAGNESIA) 400 MG/5ML suspension Take 30 mLs by mouth daily as needed for mild constipation or moderate constipation.    menthol-cetylpyridinium (CEPACOL) 3 MG lozenge Take 1 lozenge by mouth as needed for sore throat.    metoprolol succinate (TOPROL-XL) 25 MG 24 hr tablet Take 12.5 mg by mouth daily.     ondansetron (ZOFRAN) 4 MG tablet Take 4 mg by mouth every 8 (eight) hours as needed for nausea or vomiting.    oxybutynin (DITROPAN) 5 MG tablet Take 0.5 tablets (2.5 mg total) by mouth 2 (two) times daily. Qty: 10 tablet, Refills: 0    polyethylene glycol (MIRALAX / GLYCOLAX) packet Take 17 g by mouth daily as needed for mild constipation, moderate constipation or severe constipation. Reported on 02/27/2015    pravastatin (PRAVACHOL) 40 MG tablet Take 1 tablet (40 mg total) by mouth every evening. Qty: 90 tablet, Refills: 1    senna-docusate (SENOKOT-S) 8.6-50 MG tablet Take 1 tablet by mouth 2 (two) times daily.    sodium chloride (OCEAN) 0.65 % SOLN nasal spray Place 1 spray into both nostrils every 2 (two) hours as needed for congestion.      STOP taking these medications     clopidogrel (PLAVIX) 75 MG tablet      nitrofurantoin, macrocrystal-monohydrate, (MACROBID) 100 MG capsule      phenazopyridine (PYRIDIUM) 100 MG tablet      famotidine (PEPCID) 20 MG tablet          DISCHARGE INSTRUCTIONS:   Follow-up with Dr. Carlena Sax fact PMD 2 days  If you experience worsening of your admission symptoms, develop shortness of breath, life threatening emergency, suicidal or homicidal thoughts you must seek medical attention  immediately by calling 911 or calling your MD immediately  if symptoms less severe.  You Must read complete instructions/literature along with all the possible adverse reactions/side effects for all the Medicines you take and that have been prescribed to you. Take any new Medicines after you have completely understood and accept all the possible adverse reactions/side effects.   Please note  You were cared for by a hospitalist during your hospital stay. If you have any questions about your discharge medications or the care you received while you were in the hospital after you are discharged, you can call the unit and asked to speak with the hospitalist on call if the hospitalist that took care of you is not available. Once you are discharged, your primary care physician will handle any further medical issues. Please note that NO  REFILLS for any discharge medications will be authorized once you are discharged, as it is imperative that you return to your primary care physician (or establish a relationship with a primary care physician if you do not have one) for your aftercare needs so that they can reassess your need for medications and monitor your lab values.    Today   CHIEF COMPLAINT:   Chief Complaint  Patient presents with  . Shortness of Breath  . Chest Pain    HISTORY OF PRESENT ILLNESS:  Delaynie Cossio  is a 83 y.o. female presented with shortness of breath and chest pain and found to have pulmonary embolism   VITAL SIGNS:  Blood pressure (!) 133/47, pulse 80, temperature 98.7 F (37.1 C), temperature source Oral, resp. rate 16, height 5\' 2"  (1.575 m), weight 55.8 kg (123 lb), SpO2 95 %.   PHYSICAL EXAMINATION:  GENERAL:  83 y.o.-year-old patient lying in the bed with no acute distress.  EYES: Pupils equal, round, reactive to light and accommodation. No scleral icterus. Extraocular muscles intact.  HEENT: Head atraumatic, normocephalic. Oropharynx and nasopharynx clear.   NECK:  Supple, no jugular venous distention. No thyroid enlargement, no tenderness.  LUNGS: Normal breath sounds bilaterally, no wheezing, rales,rhonchi or crepitation. No use of accessory muscles of respiration.  CARDIOVASCULAR: S1, S2 normal. No murmurs, rubs, or gallops.  ABDOMEN: Soft, non-tender, non-distended. Bowel sounds present. No organomegaly or mass.  EXTREMITIES: 2+ lower extremity edema. No cyanosis, or clubbing.  NEUROLOGIC: Cranial nerves II through XII are intact. Muscle strength 5/5 in all extremities. Sensation intact. Gait not checked.  PSYCHIATRIC: The patient is alert and oriented x 3.  SKIN: Lower extremity chronic skin discoloration and some bruising  DATA REVIEW:   CBC  Recent Labs Lab 01/27/16 0519  WBC 7.9  HGB 11.8*  HCT 34.9*  PLT 165    Chemistries   Recent Labs Lab 01/25/16 1610  01/27/16 0519  NA 137  < > 139  K 3.7  < > 4.2  CL 107  < > 107  CO2 26  < > 26  GLUCOSE 105*  < > 84  BUN 21*  < > 16  CREATININE 0.81  < > 0.63  CALCIUM 9.9  < > 9.1  AST 61*  --   --   ALT 22  --   --   ALKPHOS 49  --   --   BILITOT 0.6  --   --   < > = values in this interval not displayed.  Cardiac Enzymes  Recent Labs Lab 01/25/16 1901  TROPONINI <0.03    Microbiology Results  Results for orders placed or performed during the hospital encounter of 01/25/16  Urine culture     Status: Abnormal   Collection Time: 01/25/16  4:11 PM  Result Value Ref Range Status   Specimen Description URINE, CLEAN CATCH  Final   Special Requests NONE  Final   Culture MULTIPLE SPECIES PRESENT, SUGGEST RECOLLECTION (A)  Final   Report Status 01/27/2016 FINAL  Final  Rapid Influenza A&B Antigens ([place] only)     Status: None   Collection Time: 01/25/16  5:55 PM  Result Value Ref Range Status   Influenza A ([place]) NEGATIVE NEGATIVE Final   Influenza B ([facility]) NEGATIVE NEGATIVE Final  MRSA PCR Screening     Status: Abnormal   Collection Time: 01/26/16  1:29 AM   Result Value Ref Range Status   MRSA by PCR POSITIVE (A) NEGATIVE Final  Comment:        The GeneXpert MRSA Assay (FDA approved for NASAL specimens only), is one component of a comprehensive MRSA colonization surveillance program. It is not intended to diagnose MRSA infection nor to guide or monitor treatment for MRSA infections. RESULT CALLED TO, READ BACK BY AND VERIFIED WITH: MICHELE RODGERS AT [id number] 01/26/16.PMH     RADIOLOGY:  Dg Chest 2 View  Result Date: 01/25/2016 CLINICAL DATA:  83 year old female with acute onset chest pressure jaw pain and shortness of breath. Hypoxic on room arrow. Initial encounter. EXAM: CHEST  2 VIEW COMPARISON:  CTA chest 06/01/2015 and earlier. FINDINGS: Seated AP and lateral views of the chest. Stable lung volumes. Stable cardiac size at the upper limits of normal. Other mediastinal contours are within normal limits. Visualized tracheal air column is within normal limits. Stable mild bilateral increased interstitial markings with no pneumothorax, pulmonary edema, pleural effusion or acute pulmonary opacity. Osteopenia. Stable visualized osseous structures. Calcified aortic atherosclerosis. Negative visible bowel gas pattern. IMPRESSION: No acute cardiopulmonary abnormality. Calcified aortic atherosclerosis. Electronically Signed   By: Genevie Ann M.D.   On: 01/25/2016 17:03   Ct Angio Chest Pe W Or Wo Contrast  Result Date: 01/25/2016 CLINICAL DATA:  Chest pain and shortness of breath EXAM: CT ANGIOGRAPHY CHEST WITH CONTRAST TECHNIQUE: Multidetector CT imaging of the chest was performed using the standard protocol during bolus administration of intravenous contrast. Multiplanar CT image reconstructions and MIPs were obtained to evaluate the vascular anatomy. CONTRAST:  75 mL Isovue 370 nonionic COMPARISON:  Chest CT Jun 01, 2015 chest radiograph January 25, 2016 FINDINGS: Cardiovascular: There is a small pulmonary embolus in a branch of the anterior segment right  upper lobe pulmonary artery. No more central pulmonary embolus identified. The right ventricle to left ventricle diameter does not show evidence of right heart strain. There is no demonstrable thoracic aortic aneurysm or dissection. There is mild calcification in the proximal left subclavian artery. There is also mild calcification in the proximal right common carotid artery. Visualized great vessels otherwise appear normal. There are scattered foci of atherosclerotic calcification in the aorta. There are scattered foci of coronary artery calcification. There is no appreciable pericardial effusion. There is calcification in the aortic valve, stable. Mediastinum/Nodes: There are nodular opacities in the thyroid, largest measuring approximately 1 cm. There is a lymph node in the aortopulmonary window region measuring 1.1 x 1.1 cm, stable. There is a lymph node in the sub- carinal region measuring 1.5 x 1.5 cm, stable. Several smaller mediastinal lymph nodes are stable. No new lymph node prominence evident. Lungs/Pleura: On axial slice 41 series 6, there is a 4 mm nodular opacity in the anterior segment of the right upper lobe. On axial slice 47 series 6, there is a stable 6 x 5 mm nodular opacity in the posterior segment of the left upper lobe. On axial slice 51 series 6, there is a stable 6 x 5 mm nodular opacity with a nearby 5 x 4 mm nodular opacity in this area. On axial slice 49 series 6, there is a 4 mm nodular opacity in the lingula anteriorly. There is a nodular opacity more posteriorly in the lingula on axial slice 49 series 5 measuring 4 mm. There is a nearby 3 mm nodular opacity in this area as well, stable. If there is a stable 3 mm nodular opacity in the inferior lingula anteriorly near the left heart border. There is patchy bibasilar atelectasis. There are areas of probable air  trapping from small airways disease, primarily in the right lower lobe anteriorly and medially. There is no frank edema or  consolidation. No pleural effusion or pleural thickening evident. Upper Abdomen: In the visualized upper abdomen, the left adrenal appears somewhat hypertrophied. There is atherosclerotic calcification in the aorta and major branch vessels. Musculoskeletal: There is degenerative change in the thoracic spine. There is no blastic or lytic bone lesion. Review of the MIP images confirms the above findings. IMPRESSION: Small pulmonary embolus in a peripheral branch of the anterior segment right upper lobe pulmonary artery. No larger pulmonary emboli evident. No appreciable right heart strain. Multiples areas of atherosclerotic calcification including multiple foci of coronary artery calcification. Several mildly prominent lymph nodes, stable. Several pulmonary nodular opacities as noted above. Patchy bibasilar atelectasis. Probable small airways disease, primarily on the right. No frank edema or consolidation. Multinodular goiter without dominant thyroid mass evident. Evidence a degree of left adrenal hypertrophy. Critical Value/emergent results were called by telephone at the time of interpretation on 01/25/2016 at 9:06 pm to Dr. Meade Maw , who verbally acknowledged these results. Electronically Signed   By: Lowella Grip III M.D.   On: 01/25/2016 21:08   US Venous Img Lower Bilateral  Result Date: 01/26/2016 CLINICAL DATA:  Question small right upper lobe pulmonary embolus. EXAM: BILATERAL LOWER EXTREMITY VENOUS DOPPLER ULTRASOUND TECHNIQUE: Gray-scale sonography with graded compression, as well as color Doppler and duplex ultrasound were performed to evaluate the lower extremity deep venous systems from the level of the common femoral vein and including the common femoral, femoral, profunda femoral, popliteal and calf veins including the posterior tibial, peroneal and gastrocnemius veins when visible. The superficial great saphenous vein was also interrogated. Spectral Doppler was utilized to evaluate flow  at rest and with distal augmentation maneuvers in the common femoral, femoral and popliteal veins. COMPARISON:  01/25/2016 FINDINGS: RIGHT LOWER EXTREMITY Common Femoral Vein: No evidence of thrombus. Normal compressibility, respiratory phasicity and response to augmentation. Saphenofemoral Junction: No evidence of thrombus. Normal compressibility and flow on color Doppler imaging. Profunda Femoral Vein: No evidence of thrombus. Normal compressibility and flow on color Doppler imaging. Femoral Vein: No evidence of thrombus. Normal compressibility, respiratory phasicity and response to augmentation. Popliteal Vein: No evidence of thrombus. Normal compressibility, respiratory phasicity and response to augmentation. Calf Veins: Limited assessment of the calf veins. No significant occlusive thrombus visualized. Superficial Great Saphenous Vein: No evidence of thrombus. Normal compressibility and flow on color Doppler imaging. Venous Reflux:  None. Other Findings:  None. LEFT LOWER EXTREMITY Common Femoral Vein: No evidence of thrombus. Normal compressibility, respiratory phasicity and response to augmentation. Saphenofemoral Junction: No evidence of thrombus. Normal compressibility and flow on color Doppler imaging. Profunda Femoral Vein: No evidence of thrombus. Normal compressibility and flow on color Doppler imaging. Femoral Vein: No evidence of thrombus. Normal compressibility, respiratory phasicity and response to augmentation. Popliteal Vein: No evidence of thrombus. Normal compressibility, respiratory phasicity and response to augmentation. Calf Veins: No evidence of thrombus. Normal compressibility and flow on color Doppler imaging. Superficial Great Saphenous Vein: No evidence of thrombus. Normal compressibility and flow on color Doppler imaging. Venous Reflux:  None. Other Findings:  None. IMPRESSION: No significant DVT in either lower extremity. Limited assessment of the calf veins on the right.  Electronically Signed   By: Jerilynn Mages.  Shick M.D.   On: 01/26/2016 11:20     Management plans discussed with the patient, family and they are in agreement.  CODE STATUS:  Code Status Orders        Start     Ordered   01/26/16 0159  Do not attempt resuscitation (DNR)  Continuous    Question Answer Comment  In the event of cardiac or respiratory ARREST Do not call a "code blue"   In the event of cardiac or respiratory ARREST Do not perform Intubation, CPR, defibrillation or ACLS   In the event of cardiac or respiratory ARREST Use medication by any route, position, wound care, and other measures to relive pain and suffering. May use oxygen, suction and manual treatment of airway obstruction as needed for comfort.      01/26/16 0158    Code Status History    Date Active Date Inactive Code Status Order ID Comments User Context   01/26/2016  1:27 AM 01/26/2016  1:58 AM Full Code [id number]  Harvie Bridge, DO Inpatient   09/22/2014  4:21 PM 09/24/2014  5:08 PM DNR [id number]  Vaughan Basta, MD Inpatient   09/10/2014  9:37 PM 09/14/2014  7:24 PM DNR [id number]  Gladstone Lighter, MD Inpatient   05/18/2014  7:03 PM 05/24/2014  4:31 PM DNR [id number]  Samella Parr, NP Inpatient   05/05/2014 11:15 PM 05/06/2014  5:26 PM Partial Code [id number]  Mariea Clonts, MD Inpatient    Advance Directive Documentation   Flowsheet Row Most Recent Value  Type of Advance Directive  Healthcare Power of Attorney, Living will  Pre-existing out of facility DNR order (yellow form or pink MOST form)  No data  "MOST" Form in Place?  No data      TOTAL TIME TAKING CARE OF THIS PATIENT: 35 minutes.    Loletha Grayer M.D on 01/27/2016 at 8:39 AM  Between 7am to 6pm - Pager - [phone]  After 6pm go to [email or web address] - password [email or web address]  [facility] Office  [phone]  CC: Primary care physician; Viviana Simpler, MD

## 2016-01-27 NOTE — Progress Notes (Signed)
Patients o2 saturation 91% on room air. MD notified. Ok to d/c to [facility]. Report called to [facility] and EMS called for transportation.

## 2016-01-28 DIAGNOSIS — I2690 Septic pulmonary embolism without acute cor pulmonale: Secondary | ICD-10-CM

## 2016-01-30 ENCOUNTER — Ambulatory Visit: Payer: Self-pay | Admitting: Urology

## 2016-02-01 ENCOUNTER — Other Ambulatory Visit: Payer: Self-pay

## 2016-02-01 NOTE — Patient Outreach (Signed)
[place]) Care Management  02/01/2016  MURIELLE MARSICANO [birth year] [id number]  REFERRAL DATE;  Apr 16, 2032 REFERRAL SOURCE: EMMI General discharge for follow up [place]; EMMI general discharge RED alert for: Know who to call about changes in condition? NO Transportation to follow up? NO New prescriptions? I don't know  Telephone call to patient regarding EMMI general discharge RED alert. Unable to reach patient. Contact answering phone states patient is not in her room.  Contact request return call to patient.   PLAN; RNCM will attempt 2nd telephone call to patient within  1 week   Quinn Plowman RN,BSN,CCM [facility] Telephonic  [phone]

## 2016-02-04 ENCOUNTER — Other Ambulatory Visit: Payer: Self-pay

## 2016-02-04 NOTE — Patient Outreach (Signed)
[facility]) Care Management  02/04/2016  Krista Ellis 1930-11-04 [id number]  REFERRAL DATE;  2032-10-25 REFERRAL SOURCE: EMMI General discharge for follow up [place]; EMMI general discharge RED alert for: Know who to call about changes in condition? NO Transportation to follow up? NO New prescriptions? I don't know  HIPAA verified with patient by name and DOB. Patient able to state facility name of community in which she lives.  PROVIDER: Dr. Silvio Pate  SOCIAL: Patient resides at [facility] in [place], [place].    SUBJECTIVE: Telephone call to patient regarding EMMI general discharge follow up. Discussed and offered [facility] care management services to patient. Patient confirms she was in the hospital recently. States she can't remember specifically why she was in the hospital. Patient states she stayed only a short time.  Patient states she lives at [facility] and has 3 daughters that look after her. Patient states she has not had any new symptoms or problems. Patient states she has seen her primary MD since being discharged from the hospital. Patient reports her primary MD comes to the [facility] weekly. Patient states she does not know all the medications she takes. Patient states someone at the facility gives them to her. Patient states she has access to transportation and dines at the facility.  Patient states she has someone that checks on her daily. Patient states she knows to notify someone at the facility if she is having any symptoms. Patient states the facility knows how to contact the doctor is she needs him. Patient reports she does not need anything and she is doing fine.  Patient states she has 3 daughters but request she be the primary contact regarding her health information because her daughters work.   ASSESSMENT: Patient reports residing at [facility]. Patient reports having care provided to her. Unable to review medications  with patient. Patient not sure of her medications. Reports her medications are given to her at the facility.   PLAN: RNCM will refer patient to case manager assistant to close due to patient having no further needs.  RNCM will notify patients primary MD of closure.   Quinn Plowman RN,BSN,CCM [facility] Telephonic  [phone]

## 2016-02-11 DIAGNOSIS — I10 Essential (primary) hypertension: Secondary | ICD-10-CM | POA: Diagnosis not present

## 2016-02-11 DIAGNOSIS — E039 Hypothyroidism, unspecified: Secondary | ICD-10-CM | POA: Diagnosis not present

## 2016-02-12 NOTE — Progress Notes (Signed)
02/13/2016 10:04 AM   Krista Ellis [birth year] [id number]  Referring provider: Venia Carbon, MD [facility], [place] [address]  Chief Complaint  Patient presents with  . Follow-up    vaginal burning    HPI: Patient is an 83 -year-old Caucasian female with a history of microscopic hematuria, dysuria, atrophic vaginitis, recurrent UTI's and nephrolithiasis who presents with the complaint of vaginal burning with her daughter, Krista Ellis.    Background history Patient with microscopic hematuria and suprapubic pain with a negative urine culture by [facility] emergency room.  Patient presented to the emergency room on 02/26/2015 complaining of dysuria and pain during urination. He was not having gross hematuria at that time. She also denied fevers, chills, nausea or vomiting. Her urinalysis demonstrated too numerous to count RBC's per high-power field. The urine culture was negative. Patient had a CT renal stone study on 12/28/2014 which noted a 4 mm nonobstructing left renal calculus. No evidence of ureteral calculi, hydronephrosis or other acute findings. She then had a CT scan of the abdomen and pelvis with contrast on 02/26/2015 which again identified the nonobstructing stone in the left kidney and bilateral renal cysts. She does not endorse gross hematuria. She does complain of frequent urination, dysuria, urgency, nocturia, incontinence, intermittency and urinary tract infections. She also complains of bladder spasms. She is not experiencing fevers, chills, nausea or vomiting.  She had a urine culture and forward 24th 2017 that showed Escherichia coli was pansensitive except for intermittent resistance to cephalothin.  The patient underwent cystoscopy today which was normal to vision was somewhat limited by concurrent use of uribel.  She she has been on uribel which is significantly improved her dysuria. She is also treated recently for urinary tract  infection is currently taking Macrobid.  Today, she is complaining of frequency x 7, incontinence x 7 and nocturia x 3.  She is currently on oxybutynin 2.5 mg daily.  She is not complaining of vaginal burning at this time.  She was recently seen in the ED for a pulmonary emboism and the vaginal estrogen cream had to be discontinued.  She denies gross hematuria, suprapubic pain and dysuria.  She is not having fevers, chills, nausea or vomiting.   Her PVR is 278 mL.     PMH: Past Medical History:  Diagnosis Date  . Arthritis   . Bleeding disorder ([place])   . Chronic cystitis   . Collagen vascular disease ([place])   . CVA (cerebral infarction) 2003  . Dysuria   . Gross hematuria   . Hematuria   . Hemihypertrophy   . History of kidney stones   . Hyperlipidemia   . Hypertension   . Murmur, cardiac   . Myocardial infarction   . Peripheral vascular disease ([place])    s/p stent right leg  . Pulmonary nodule    stable on Chest CT March 2014, [facility]  . Recurrent UTI   . Sensory urge incontinence   . Thyroid disease   . Urinary frequency     Surgical History: Past Surgical History:  Procedure Laterality Date  . ABDOMINAL HYSTERECTOMY  1982  . APPENDECTOMY    . CARDIAC SURGERY     2 failed stents in right leg  . CARPAL TUNNEL RELEASE    . HIP SURGERY  2007   replaced ball in right hip  . left rotator cuff     repair  . ruptured disc    .  SPINE SURGERY     steel rod in back  . stent placement leg Right 2015  . TONSILLECTOMY    . TOTAL HIP ARTHROPLASTY     right  . TOTAL KNEE ARTHROPLASTY Left     Home Medications:  Allergies as of 02/13/2016      Reactions   Sulfa Antibiotics Hives, Itching   Ciprofloxacin Hives, Itching   Levofloxacin Hives, Itching   Penicillins Hives, Itching      Medication List       Accurate as of 02/13/16 10:04 AM. Always use your most recent med list.          acetaminophen 325 MG tablet Commonly known as:  TYLENOL Take 325 mg by  mouth every 4 (four) hours as needed for mild pain, moderate pain, fever or headache. Reported on 02/27/2015   acetaminophen 650 MG CR tablet Commonly known as:  TYLENOL Take 650 mg by mouth 3 (three) times daily. Reported on 04/13/2015   amLODipine 5 MG tablet Commonly known as:  NORVASC Take 5 mg by mouth daily.   apixaban 5 MG Tabs tablet Commonly known as:  ELIQUIS Two tablets twice a day for six days then one tablet twice a day afterwards   bifidobacterium infantis capsule Take 1 capsule by mouth daily.   bisacodyl 10 MG suppository Commonly known as:  DULCOLAX Place 10 mg rectally daily as needed for moderate constipation.   cephALEXin 500 MG capsule Commonly known as:  KEFLEX Take 1 capsule (500 mg total) by mouth 3 (three) times daily.   Cranberry 425 MG Caps Take 425 mg by mouth daily.   cyanocobalamin 1000 MCG/ML injection Commonly known as:  (VITAMIN B-12) Inject 1,000 mcg into the muscle every 30 (thirty) days. On the 2nd of each month   dextromethorphan-guaiFENesin 10-100 MG/5ML liquid Commonly known as:  ROBITUSSIN-DM Take 10 mLs by mouth every 4 (four) hours as needed for cough.   ergocalciferol 50000 units capsule Commonly known as:  VITAMIN D2 Take 50,000 Units by mouth every 30 (thirty) days. Take on the 13th of every month   estradiol 0.1 MG/GM vaginal cream Commonly known as:  ESTRACE Place 1 Applicatorful vaginally 3 (three) times a week.   levothyroxine 25 MCG tablet Commonly known as:  SYNTHROID, LEVOTHROID TAKE 1 TABLET (25 MCG TOTAL) BY MOUTH DAILY.   loratadine 10 MG tablet Commonly known as:  CLARITIN Take 1 tablet (10 mg total) by mouth daily.   magnesium hydroxide 400 MG/5ML suspension Commonly known as:  MILK OF MAGNESIA Take 30 mLs by mouth daily as needed for mild constipation or moderate constipation.   menthol-cetylpyridinium 3 MG lozenge Commonly known as:  CEPACOL Take 1 lozenge by mouth as needed for sore throat.     metoprolol succinate 25 MG 24 hr tablet Commonly known as:  TOPROL-XL Take 12.5 mg by mouth daily.   ondansetron 4 MG tablet Commonly known as:  ZOFRAN Take 4 mg by mouth every 8 (eight) hours as needed for nausea or vomiting.   oxybutynin 5 MG tablet Commonly known as:  DITROPAN Take 0.5 tablets (2.5 mg total) by mouth 2 (two) times daily.   polyethylene glycol packet Commonly known as:  MIRALAX / GLYCOLAX Take 17 g by mouth daily as needed for mild constipation, moderate constipation or severe constipation. Reported on 02/27/2015   pravastatin 40 MG tablet Commonly known as:  PRAVACHOL Take 1 tablet (40 mg total) by mouth every evening.   PROBIATA Tabs Take 1 tablet by  mouth 2 (two) times daily.   senna-docusate 8.6-50 MG tablet Commonly known as:  Senokot-S Take 1 tablet by mouth 2 (two) times daily.   sodium chloride 0.65 % Soln nasal spray Commonly known as:  OCEAN Place 1 spray into both nostrils every 2 (two) hours as needed for congestion.   traMADol 50 MG tablet Commonly known as:  ULTRAM Take 0.5 tablets (25 mg total) by mouth every 8 (eight) hours as needed for moderate pain.   vitamin C 1000 MG tablet Take 1 tablet (1,000 mg total) by mouth daily.       Allergies:  Allergies  Allergen Reactions  . Sulfa Antibiotics Hives and Itching  . Ciprofloxacin Hives and Itching  . Levofloxacin Hives and Itching  . Penicillins Hives and Itching    Family History: Family History  Problem Relation Age of Onset  . Hypertension Mother   . Transient ischemic attack Mother   . Kidney disease Neg Hx   . Bladder Cancer Neg Hx   . Prostate cancer Neg Hx     Social History:  reports that she has never smoked. She has never used smokeless tobacco. She reports that she does not drink alcohol or use drugs.  ROS: UROLOGY Frequent Urination?: No Hard to postpone urination?: No Burning/pain with urination?: Yes Get up at night to urinate?: No Leakage of urine?:  No Urine stream starts and stops?: No Trouble starting stream?: No Do you have to strain to urinate?: No Blood in urine?: No Urinary tract infection?: No Sexually transmitted disease?: No Injury to kidneys or bladder?: No Painful intercourse?: No Weak stream?: No Currently pregnant?: No Vaginal bleeding?: No Last menstrual period?: n  Gastrointestinal Nausea?: No Vomiting?: No Indigestion/heartburn?: No Diarrhea?: No Constipation?: No  Constitutional Fever: No Night sweats?: No Weight loss?: No Fatigue?: No  Skin Skin rash/lesions?: No Itching?: No  Eyes Blurred vision?: No Double vision?: No  Ears/Nose/Throat Sore throat?: No Sinus problems?: No  Hematologic/Lymphatic Swollen glands?: No Easy bruising?: No  Cardiovascular Leg swelling?: No Chest pain?: No  Respiratory Cough?: No Shortness of breath?: No  Endocrine Excessive thirst?: No  Musculoskeletal Back pain?: No Joint pain?: No  Neurological Headaches?: No Dizziness?: No  Psychologic Depression?: No Anxiety?: No  Physical Exam: BP (!) 162/66   Pulse 62   Ht 5\' 1"  (1.549 m)   Wt 120 lb 9.6 oz (54.7 kg)   BMI 22.79 kg/m   Constitutional:  Alert and oriented, No acute distress. HEENT: [place] AT, moist mucus membranes.  Trachea midline, no masses. Cardiovascular: No clubbing, cyanosis, or edema. Respiratory: Normal respiratory effort, no increased work of breathing. GI: Abdomen is soft, nontender, nondistended, no abdominal masses GU: No CVA tenderness.  Skin: No rashes, bruises or suspicious lesions. Lymph: No cervical or inguinal adenopathy. Neurologic: Grossly intact, no focal deficits, moving all 4 extremities. Psychiatric: Normal mood and affect.  Laboratory Data: Lab Results  Component Value Date   WBC 7.9 01/27/2016   HGB 11.8 (L) 01/27/2016   HCT 34.9 (L) 01/27/2016   MCV 95.1 01/27/2016   PLT 165 01/27/2016    Lab Results  Component Value Date   CREATININE 0.63  01/27/2016    Lab Results  Component Value Date   HGBA1C 5.5 05/19/2014     Pertinent Imaging: Results for SHELY, WINTHROP (MRN [id number]) as of 02/13/2016 10:02  Ref. Range 02/13/2016 09:38  Scan Result Unknown [id number]    Assessment & Plan:    1. Microscopic hematuria  -negative workup  except for nonobstructing 4 mm left renal stone.  2. Dysuria  - not occurring at this time  3. Atrophic vaginitis  - no longer a candidate for vaginal estrogen cream due to PE  4. Recurrent UTI   - reviewed UTI prevention strategies  -  recommend only treating symptomatic urinary tract infections and recommend against treating asymptomatic bacteriuria  -  patient instructed to present to the office for a CATH urine sample if she feels like she is getting a urinary tract infection  - discontinue the oxybutynin  5. Nephrolithiasis  - 4 mm left nonobstructing renal calculus. No further workup needed.  Return in about 1 year (around 02/12/2017) for PVR and OAB questionnaire.  Zara Council, [facility] [address] [place], [place] [address] [phone]

## 2016-02-13 ENCOUNTER — Encounter: Payer: Self-pay | Admitting: Urology

## 2016-02-13 ENCOUNTER — Ambulatory Visit (INDEPENDENT_AMBULATORY_CARE_PROVIDER_SITE_OTHER): Payer: Medicare Other | Admitting: Urology

## 2016-02-13 VITALS — BP 162/66 | HR 62 | Ht 61.0 in | Wt 120.6 lb

## 2016-02-13 DIAGNOSIS — R3129 Other microscopic hematuria: Secondary | ICD-10-CM | POA: Diagnosis not present

## 2016-02-13 DIAGNOSIS — N200 Calculus of kidney: Secondary | ICD-10-CM | POA: Diagnosis not present

## 2016-02-13 DIAGNOSIS — N390 Urinary tract infection, site not specified: Secondary | ICD-10-CM

## 2016-02-13 DIAGNOSIS — N949 Unspecified condition associated with female genital organs and menstrual cycle: Secondary | ICD-10-CM | POA: Diagnosis not present

## 2016-02-13 DIAGNOSIS — N952 Postmenopausal atrophic vaginitis: Secondary | ICD-10-CM | POA: Diagnosis not present

## 2016-02-13 LAB — BLADDER SCAN AMB NON-IMAGING: SCAN RESULT: 278

## 2016-02-13 MED ORDER — VITAMIN C 1000 MG PO TABS
1000.0000 mg | ORAL_TABLET | Freq: Every day | ORAL | 3 refills | Status: DC
Start: 2016-02-13 — End: 2021-11-19

## 2016-02-13 MED ORDER — PROBIATA PO TABS: 1.0000 | ORAL_TABLET | Freq: Two times a day (BID) | ORAL | 3 refills | Status: DC

## 2016-02-13 NOTE — Patient Instructions (Signed)

## 2016-02-14 NOTE — Telephone Encounter (Signed)
Did you address this? It was still in my box.

## 2016-02-14 NOTE — Telephone Encounter (Signed)
Pt was seen yesterday.

## 2016-03-18 DIAGNOSIS — I6529 Occlusion and stenosis of unspecified carotid artery: Secondary | ICD-10-CM | POA: Diagnosis not present

## 2016-03-18 DIAGNOSIS — G9009 Other idiopathic peripheral autonomic neuropathy: Secondary | ICD-10-CM | POA: Diagnosis not present

## 2016-03-18 DIAGNOSIS — I2699 Other pulmonary embolism without acute cor pulmonale: Secondary | ICD-10-CM | POA: Diagnosis not present

## 2016-03-18 DIAGNOSIS — I350 Nonrheumatic aortic (valve) stenosis: Secondary | ICD-10-CM | POA: Diagnosis not present

## 2016-03-18 DIAGNOSIS — G3184 Mild cognitive impairment, so stated: Secondary | ICD-10-CM | POA: Diagnosis not present

## 2016-04-02 DIAGNOSIS — L239 Allergic contact dermatitis, unspecified cause: Secondary | ICD-10-CM | POA: Diagnosis not present

## 2016-04-04 DIAGNOSIS — L239 Allergic contact dermatitis, unspecified cause: Secondary | ICD-10-CM | POA: Diagnosis not present

## 2016-04-14 DIAGNOSIS — J0100 Acute maxillary sinusitis, unspecified: Secondary | ICD-10-CM | POA: Diagnosis not present

## 2016-05-09 DIAGNOSIS — R300 Dysuria: Secondary | ICD-10-CM | POA: Diagnosis not present

## 2016-05-13 ENCOUNTER — Encounter: Payer: Self-pay | Admitting: Emergency Medicine

## 2016-05-13 ENCOUNTER — Inpatient Hospital Stay
Admission: EM | Admit: 2016-05-13 | Discharge: 2016-05-15 | DRG: 179 | Disposition: A | Discharge status: Skilled Nursing Facility | Payer: Medicare Other | Attending: Internal Medicine | Admitting: Internal Medicine

## 2016-05-13 ENCOUNTER — Emergency Department: Payer: Medicare Other

## 2016-05-13 ENCOUNTER — Telehealth: Payer: Self-pay

## 2016-05-13 ENCOUNTER — Inpatient Hospital Stay
Admit: 2016-05-13 | Discharge: 2016-05-13 | Disposition: A | Discharge status: Home / Self Care | Payer: Medicare Other | Attending: Internal Medicine | Admitting: Internal Medicine

## 2016-05-13 DIAGNOSIS — I739 Peripheral vascular disease, unspecified: Secondary | ICD-10-CM | POA: Diagnosis present

## 2016-05-13 DIAGNOSIS — R0789 Other chest pain: Secondary | ICD-10-CM | POA: Diagnosis not present

## 2016-05-13 DIAGNOSIS — Z8673 Personal history of transient ischemic attack (TIA), and cerebral infarction without residual deficits: Secondary | ICD-10-CM

## 2016-05-13 DIAGNOSIS — Z7982 Long term (current) use of aspirin: Secondary | ICD-10-CM

## 2016-05-13 DIAGNOSIS — Z79899 Other long term (current) drug therapy: Secondary | ICD-10-CM | POA: Diagnosis not present

## 2016-05-13 DIAGNOSIS — I252 Old myocardial infarction: Secondary | ICD-10-CM

## 2016-05-13 DIAGNOSIS — J690 Pneumonitis due to inhalation of food and vomit: Secondary | ICD-10-CM | POA: Diagnosis not present

## 2016-05-13 DIAGNOSIS — Z96649 Presence of unspecified artificial hip joint: Secondary | ICD-10-CM | POA: Diagnosis present

## 2016-05-13 DIAGNOSIS — R0902 Hypoxemia: Secondary | ICD-10-CM | POA: Diagnosis present

## 2016-05-13 DIAGNOSIS — R079 Chest pain, unspecified: Secondary | ICD-10-CM

## 2016-05-13 DIAGNOSIS — E039 Hypothyroidism, unspecified: Secondary | ICD-10-CM | POA: Diagnosis present

## 2016-05-13 DIAGNOSIS — R0602 Shortness of breath: Secondary | ICD-10-CM | POA: Diagnosis not present

## 2016-05-13 DIAGNOSIS — I10 Essential (primary) hypertension: Secondary | ICD-10-CM | POA: Diagnosis present

## 2016-05-13 DIAGNOSIS — K219 Gastro-esophageal reflux disease without esophagitis: Secondary | ICD-10-CM | POA: Diagnosis present

## 2016-05-13 DIAGNOSIS — Z86711 Personal history of pulmonary embolism: Secondary | ICD-10-CM | POA: Diagnosis not present

## 2016-05-13 DIAGNOSIS — Z880 Allergy status to penicillin: Secondary | ICD-10-CM | POA: Diagnosis not present

## 2016-05-13 DIAGNOSIS — Z66 Do not resuscitate: Secondary | ICD-10-CM | POA: Diagnosis present

## 2016-05-13 DIAGNOSIS — Z882 Allergy status to sulfonamides status: Secondary | ICD-10-CM | POA: Diagnosis not present

## 2016-05-13 DIAGNOSIS — R05 Cough: Secondary | ICD-10-CM | POA: Diagnosis not present

## 2016-05-13 DIAGNOSIS — N3020 Other chronic cystitis without hematuria: Secondary | ICD-10-CM | POA: Diagnosis present

## 2016-05-13 DIAGNOSIS — Z9109 Other allergy status, other than to drugs and biological substances: Secondary | ICD-10-CM

## 2016-05-13 DIAGNOSIS — Z96652 Presence of left artificial knee joint: Secondary | ICD-10-CM | POA: Diagnosis present

## 2016-05-13 DIAGNOSIS — E785 Hyperlipidemia, unspecified: Secondary | ICD-10-CM | POA: Diagnosis present

## 2016-05-13 DIAGNOSIS — R059 Cough, unspecified: Secondary | ICD-10-CM

## 2016-05-13 DIAGNOSIS — R011 Cardiac murmur, unspecified: Secondary | ICD-10-CM | POA: Diagnosis present

## 2016-05-13 DIAGNOSIS — N390 Urinary tract infection, site not specified: Secondary | ICD-10-CM

## 2016-05-13 DIAGNOSIS — Z7901 Long term (current) use of anticoagulants: Secondary | ICD-10-CM | POA: Diagnosis not present

## 2016-05-13 DIAGNOSIS — R0600 Dyspnea, unspecified: Secondary | ICD-10-CM | POA: Diagnosis not present

## 2016-05-13 LAB — BASIC METABOLIC PANEL
ANION GAP: 7 (ref 5–15)
BUN: 25 mg/dL — AB (ref 6–20)
CALCIUM: 9.4 mg/dL (ref 8.9–10.3)
CO2: 27 mmol/L (ref 22–32)
CREATININE: 0.64 mg/dL (ref 0.44–1.00)
Chloride: 107 mmol/L (ref 101–111)
GFR calc Af Amer: >60 mL/min (ref >60)
GFR calc non Af Amer: >60 mL/min (ref >60)
GLUCOSE: 131 mg/dL — AB (ref 65–99)
Potassium: 4.1 mmol/L (ref 3.5–5.1)
Sodium: 141 mmol/L (ref 135–145)

## 2016-05-13 LAB — URINALYSIS, COMPLETE (UACMP) WITH MICROSCOPIC
BILIRUBIN URINE: NEGATIVE
Glucose, UA: NEGATIVE mg/dL
KETONES UR: NEGATIVE mg/dL
Nitrite: NEGATIVE
Protein, ur: NEGATIVE mg/dL
SPECIFIC GRAVITY, URINE: 1.013 (ref 1.005–1.030)
pH: 5 (ref 5.0–8.0)

## 2016-05-13 LAB — TROPONIN I
Troponin I: <0.03 ng/mL (ref <0.03)
Troponin I: <0.03 ng/mL (ref <0.03)
Troponin I: <0.03 ng/mL (ref <0.03)
Troponin I: <0.03 ng/mL (ref <0.03)

## 2016-05-13 LAB — CBC
HCT: 41 % (ref 35.0–47.0)
HEMOGLOBIN: 13.6 g/dL (ref 12.0–16.0)
MCH: 31.2 pg (ref 26.0–34.0)
MCHC: 33.3 g/dL (ref 32.0–36.0)
MCV: 93.6 fL (ref 80.0–100.0)
Platelets: 239 10*3/uL (ref 150–440)
RBC: 4.38 MIL/uL (ref 3.80–5.20)
RDW: 12.7 % (ref 11.5–14.5)
WBC: 13.2 10*3/uL — ABNORMAL HIGH (ref 3.6–11.0)

## 2016-05-13 LAB — MRSA PCR SCREENING: MRSA by PCR: POSITIVE — AB

## 2016-05-13 LAB — TSH: TSH: 2.748 u[IU]/mL (ref 0.350–4.500)

## 2016-05-13 MED ORDER — MAGNESIUM HYDROXIDE 400 MG/5ML PO SUSP: 30.0000 mL | Freq: Every day | ORAL | Status: DC | PRN

## 2016-05-13 MED ORDER — PRAVASTATIN SODIUM 40 MG PO TABS
40.0000 mg | ORAL_TABLET | Freq: Every evening | ORAL | Status: DC
  Administered 2016-05-13 – 2016-05-14 (×2): 40 mg via ORAL
  Filled 2016-05-13 (×2): qty 1

## 2016-05-13 MED ORDER — PANTOPRAZOLE SODIUM 40 MG PO TBEC
40.0000 mg | DELAYED_RELEASE_TABLET | Freq: Every day | ORAL | Status: DC
  Administered 2016-05-13: 40 mg via ORAL
  Filled 2016-05-13: qty 1

## 2016-05-13 MED ORDER — VITAMIN C 500 MG PO TABS
1000.0000 mg | ORAL_TABLET | Freq: Every day | ORAL | Status: DC
  Administered 2016-05-13 – 2016-05-15 (×3): 1000 mg via ORAL
  Filled 2016-05-13 (×3): qty 2

## 2016-05-13 MED ORDER — NITROFURANTOIN MONOHYD MACRO 100 MG PO CAPS
100.0000 mg | ORAL_CAPSULE | Freq: Two times a day (BID) | ORAL | Status: DC
  Administered 2016-05-13 – 2016-05-15 (×5): 100 mg via ORAL
  Filled 2016-05-13 (×6): qty 1

## 2016-05-13 MED ORDER — METOPROLOL SUCCINATE ER 25 MG PO TB24
12.5000 mg | ORAL_TABLET | Freq: Every day | ORAL | Status: DC
  Administered 2016-05-13 – 2016-05-15 (×3): 12.5 mg via ORAL
  Filled 2016-05-13 (×3): qty 1

## 2016-05-13 MED ORDER — SODIUM CHLORIDE 0.9% FLUSH
3.0000 mL | Freq: Two times a day (BID) | INTRAVENOUS | Status: DC
  Administered 2016-05-13 – 2016-05-15 (×3): 3 mL via INTRAVENOUS

## 2016-05-13 MED ORDER — SENNA 8.6 MG PO TABS
2.0000 | ORAL_TABLET | Freq: Every day | ORAL | Status: DC
  Administered 2016-05-13 – 2016-05-15 (×3): 17.2 mg via ORAL
  Filled 2016-05-13 (×3): qty 2

## 2016-05-13 MED ORDER — VITAMIN D (ERGOCALCIFEROL) 1.25 MG (50000 UNIT) PO CAPS
50000.0000 [IU] | ORAL_CAPSULE | ORAL | Status: DC
Start: 2016-06-01 — End: 2016-05-15

## 2016-05-13 MED ORDER — DEXTROMETHORPHAN-GUAIFENESIN 10-100 MG/5ML PO LIQD
10.0000 mL | ORAL | Status: DC | PRN
  Filled 2016-05-13: qty 10

## 2016-05-13 MED ORDER — APIXABAN 5 MG PO TABS
5.0000 mg | ORAL_TABLET | Freq: Two times a day (BID) | ORAL | Status: DC
  Administered 2016-05-13 – 2016-05-15 (×5): 5 mg via ORAL
  Filled 2016-05-13 (×5): qty 1

## 2016-05-13 MED ORDER — BISACODYL 10 MG RE SUPP: 10.0000 mg | Freq: Every day | RECTAL | Status: DC | PRN

## 2016-05-13 MED ORDER — CHLORHEXIDINE GLUCONATE CLOTH 2 % EX PADS
6.0000 | MEDICATED_PAD | Freq: Every day | CUTANEOUS | Status: DC
  Administered 2016-05-13: 6 via TOPICAL

## 2016-05-13 MED ORDER — SALINE SPRAY 0.65 % NA SOLN
1.0000 | NASAL | Status: DC | PRN
  Filled 2016-05-13: qty 44

## 2016-05-13 MED ORDER — MENTHOL 3 MG MT LOZG: 1.0000 | LOZENGE | OROMUCOSAL | Status: DC | PRN

## 2016-05-13 MED ORDER — MUPIROCIN 2 % EX OINT
1.0000 "application " | TOPICAL_OINTMENT | Freq: Two times a day (BID) | CUTANEOUS | Status: DC
  Administered 2016-05-13 – 2016-05-15 (×5): 1 via NASAL
  Filled 2016-05-13: qty 22

## 2016-05-13 MED ORDER — CRANBERRY 425 MG PO CAPS: 425.0000 mg | ORAL_CAPSULE | Freq: Every day | ORAL | Status: DC

## 2016-05-13 MED ORDER — PANTOPRAZOLE SODIUM 40 MG PO TBEC
40.0000 mg | DELAYED_RELEASE_TABLET | Freq: Two times a day (BID) | ORAL | Status: DC
  Administered 2016-05-13 – 2016-05-14 (×3): 40 mg via ORAL
  Filled 2016-05-13 (×3): qty 1

## 2016-05-13 MED ORDER — ACETAMINOPHEN 650 MG RE SUPP: 650.0000 mg | Freq: Four times a day (QID) | RECTAL | Status: DC | PRN

## 2016-05-13 MED ORDER — CYANOCOBALAMIN 1000 MCG/ML IJ SOLN: 1000.0000 ug | INTRAMUSCULAR | Status: DC

## 2016-05-13 MED ORDER — RISAQUAD PO CAPS
1.0000 | ORAL_CAPSULE | Freq: Every day | ORAL | Status: DC
  Administered 2016-05-13 – 2016-05-15 (×3): 1 via ORAL
  Filled 2016-05-13 (×3): qty 1

## 2016-05-13 MED ORDER — LORATADINE 10 MG PO TABS
10.0000 mg | ORAL_TABLET | Freq: Every day | ORAL | Status: DC
  Administered 2016-05-13 – 2016-05-15 (×3): 10 mg via ORAL
  Filled 2016-05-13 (×3): qty 1

## 2016-05-13 MED ORDER — POLYETHYLENE GLYCOL 3350 17 G PO PACK: 17.0000 g | PACK | Freq: Every day | ORAL | Status: DC | PRN

## 2016-05-13 MED ORDER — AMLODIPINE BESYLATE 5 MG PO TABS
5.0000 mg | ORAL_TABLET | Freq: Every day | ORAL | Status: DC
  Administered 2016-05-13 – 2016-05-15 (×3): 5 mg via ORAL
  Filled 2016-05-13 (×3): qty 1

## 2016-05-13 MED ORDER — TRAMADOL HCL 50 MG PO TABS
25.0000 mg | ORAL_TABLET | Freq: Three times a day (TID) | ORAL | Status: DC | PRN
  Administered 2016-05-13 – 2016-05-15 (×4): 25 mg via ORAL
  Filled 2016-05-13 (×4): qty 1

## 2016-05-13 MED ORDER — ALIGN 4 MG PO CAPS: 1.0000 | ORAL_CAPSULE | Freq: Every day | ORAL | Status: DC

## 2016-05-13 MED ORDER — SODIUM CHLORIDE 0.9 % IV SOLN
INTRAVENOUS | Status: DC
  Administered 2016-05-13 – 2016-05-14 (×3): via INTRAVENOUS

## 2016-05-13 MED ORDER — PHENAZOPYRIDINE HCL 100 MG PO TABS: 100.0000 mg | ORAL_TABLET | Freq: Two times a day (BID) | ORAL | Status: DC | PRN

## 2016-05-13 MED ORDER — DOCUSATE SODIUM 100 MG PO CAPS
100.0000 mg | ORAL_CAPSULE | Freq: Two times a day (BID) | ORAL | Status: DC
  Administered 2016-05-13 – 2016-05-15 (×5): 100 mg via ORAL
  Filled 2016-05-13 (×5): qty 1

## 2016-05-13 MED ORDER — LEVOTHYROXINE SODIUM 25 MCG PO TABS
25.0000 ug | ORAL_TABLET | Freq: Every day | ORAL | Status: DC
  Administered 2016-05-13 – 2016-05-15 (×3): 25 ug via ORAL
  Filled 2016-05-13 (×3): qty 1

## 2016-05-13 MED ORDER — ONDANSETRON HCL 4 MG/2ML IJ SOLN: 4.0000 mg | Freq: Four times a day (QID) | INTRAMUSCULAR | Status: DC | PRN

## 2016-05-13 MED ORDER — OXYBUTYNIN CHLORIDE 5 MG PO TABS
2.5000 mg | ORAL_TABLET | Freq: Two times a day (BID) | ORAL | Status: DC
  Administered 2016-05-13 – 2016-05-14 (×3): 2.5 mg via ORAL
  Filled 2016-05-13: qty 1
  Filled 2016-05-13 (×2): qty 0.5
  Filled 2016-05-13: qty 1

## 2016-05-13 MED ORDER — ACETAMINOPHEN 325 MG PO TABS
650.0000 mg | ORAL_TABLET | Freq: Four times a day (QID) | ORAL | Status: DC | PRN
  Administered 2016-05-13 – 2016-05-14 (×4): 650 mg via ORAL
  Filled 2016-05-13 (×4): qty 2

## 2016-05-13 MED ORDER — ONDANSETRON HCL 4 MG PO TABS: 4.0000 mg | ORAL_TABLET | Freq: Four times a day (QID) | ORAL | Status: DC | PRN

## 2016-05-13 MED ORDER — HYDROXYZINE HCL 25 MG PO TABS: 12.5000 mg | ORAL_TABLET | Freq: Three times a day (TID) | ORAL | Status: DC | PRN

## 2016-05-13 NOTE — Telephone Encounter (Signed)
Per chart review tab pt is presently admitted to [facility]. 

## 2016-05-13 NOTE — Clinical Social Work Note (Signed)
CSW was informed that patient is a long term care resident at [facility], and the plan is to return back to SNF once she is medically ready for discharge and orders have been received.  CSW to continue to follow patient's progress throughout discharge planning.  Jones Broom. Norval Morton, MSW, [place]  05/13/2016 4:23 PM

## 2016-05-13 NOTE — ED Triage Notes (Signed)
Pt arrived via ems from [facility]. EMS was called out for respiratory distress and chest pain. Upon initial arrival ems reported an 02 reading of 88% and placed pt on 2L. EMS reports pt was given 1 spray of nitroglycerin which dropped pt's heart rate from 70 to 20. EMS established IV and administered a bolus of NS. PT received 200cc. PT was also given 324mg  of aspirin in route.Marland Kitchen Upon arrival to ED pt 93% on room air.PT alert and oriented and complaining of center chest pain that feels like pressure.

## 2016-05-13 NOTE — ED Notes (Signed)
Pt will be going to room 248 as soon as report is called.  Pt with nad noted. Resting quietly. Family at bedside.

## 2016-05-13 NOTE — H&P (Signed)
Krista Ellis is an 83 y.o. female.   Chief Complaint: Chest pain  HPI: The patient with past medical history of recurrent cystitis as well as hypertension and pulmonary embolism presents to the emergency department complaining of chest pain. The patient states that she awoke this morning and realized that she had a dull aching pain in the center of her chest. She denies any shortness of breath, nausea, vomiting or diaphoresis. She is unable to cannulate and thus hit her alert button for help. EMS arrived and gave the patient aspirin 325 mg as well as nitroglycerin. She reportedly had a drop in heart rate immediately after the nitroglycerin was administered. Here in the emergency department the patient's heart rate has been normal but oxygen saturations were initially 88% on room air. She states that her chest discomfort has eased slightly but is still present. It does not radiate. Laboratory evaluation is reassuring but due to persistent chest pain emergency department staff called the hospitalist service for admission.  Past Medical History:  Diagnosis Date  . Arthritis   . Bleeding disorder ([place])   . Chronic cystitis   . Collagen vascular disease ([place])   . CVA (cerebral infarction) 2003  . Dysuria   . Gross hematuria   . Hematuria   . Hemihypertrophy   . History of kidney stones   . Hyperlipidemia   . Hypertension   . Murmur, cardiac   . Myocardial infarction ([place])   . Peripheral vascular disease ([place])    s/p stent right leg  . Pulmonary nodule    stable on Chest CT March 2014, Follow[facility] at [facility]  . Recurrent UTI   . Sensory urge incontinence   . Thyroid disease   . Urinary frequency     Past Surgical History:  Procedure Laterality Date  . ABDOMINAL HYSTERECTOMY  1982  . APPENDECTOMY    . CARDIAC SURGERY     2 failed stents in right leg  . CARPAL TUNNEL RELEASE    . HIP SURGERY  2007   replaced ball in right hip  . left rotator cuff     repair  . ruptured disc    .  SPINE SURGERY     steel rod in back  . stent placement leg Right 2015  . TONSILLECTOMY    . TOTAL HIP ARTHROPLASTY     right  . TOTAL KNEE ARTHROPLASTY Left     Family History  Problem Relation Age of Onset  . Hypertension Mother   . Transient ischemic attack Mother   . Kidney disease Neg Hx   . Bladder Cancer Neg Hx   . Prostate cancer Neg Hx    Social History:  reports that she has never smoked. She has never used smokeless tobacco. She reports that she does not drink alcohol or use drugs.  Allergies:  Allergies  Allergen Reactions  . Sulfa Antibiotics Hives and Itching  . Ciprofloxacin Hives and Itching  . Levofloxacin Hives and Itching  . Penicillins Hives and Itching    Prior to Admission medications   Medication Sig Start Date End Date Taking? Authorizing Provider  acetaminophen (TYLENOL) 325 MG tablet Take 325 mg by mouth every 4 (four) hours as needed for mild pain, moderate pain, fever or headache. Reported on 02/27/2015   Yes Historical Provider, MD  acetaminophen (TYLENOL) 650 MG CR tablet Take 650 mg by mouth every 8 (eight) hours.   Yes Historical Provider, MD  amLODipine (NORVASC) 5 MG tablet Take 5 mg by  mouth daily.   Yes Historical Provider, MD  apixaban (ELIQUIS) 5 MG TABS tablet Two tablets twice a day for six days then one tablet twice a day afterwards Patient taking differently: Take 5 mg by mouth 2 (two) times daily.  01/27/16  Yes Loletha Grayer, MD  Ascorbic Acid (VITAMIN C) 1000 MG tablet Take 1 tablet (1,000 mg total) by mouth daily. 02/13/16  Yes Shannon A McGowan, PA-C  bisacodyl (DULCOLAX) 10 MG suppository Place 10 mg rectally daily as needed for moderate constipation.   Yes Historical Provider, MD  cetirizine (ZYRTEC) 10 MG tablet Take 10 mg by mouth daily.   Yes Historical Provider, MD  Cholecalciferol (VITAMIN D3) 50000 units CAPS Take 50,000 Units by mouth every 30 (thirty) days. Patient takes on the 13th   Yes Historical Provider, MD  Cranberry  425 MG CAPS Take 425 mg by mouth daily.   Yes Historical Provider, MD  cyanocobalamin (,VITAMIN B-12,) 1000 MCG/ML injection Inject 1,000 mcg into the muscle every 30 (thirty) days. On the 2nd of each month   Yes Historical Provider, MD  dextromethorphan-guaiFENesin (ROBITUSSIN-DM) 10-100 MG/5ML liquid Take 10 mLs by mouth every 4 (four) hours as needed for cough.   Yes Historical Provider, MD  hydrOXYzine (ATARAX/VISTARIL) 25 MG tablet Take 12.5 mg by mouth 3 (three) times daily as needed for itching.   Yes Historical Provider, MD  levothyroxine (SYNTHROID, LEVOTHROID) 25 MCG tablet TAKE 1 TABLET (25 MCG TOTAL) BY MOUTH DAILY. Patient taking differently: Take 25 mcg by mouth daily.  07/04/14  Yes Jackolyn Confer, MD  magnesium hydroxide (MILK OF MAGNESIA) 400 MG/5ML suspension Take 30 mLs by mouth daily as needed for mild constipation or moderate constipation.   Yes Historical Provider, MD  menthol-cetylpyridinium (CEPACOL) 3 MG lozenge Take 1 lozenge by mouth as needed for sore throat.   Yes Historical Provider, MD  metoprolol succinate (TOPROL-XL) 25 MG 24 hr tablet Take 12.5 mg by mouth daily.    Yes Historical Provider, MD  nitrofurantoin, macrocrystal-monohydrate, (MACROBID) 100 MG capsule Take 100 mg by mouth 2 (two) times daily. 05/12/16 05/15/16 Yes Historical Provider, MD  omeprazole (PRILOSEC) 20 MG capsule Take 20 mg by mouth daily.   Yes Historical Provider, MD  ondansetron (ZOFRAN) 4 MG tablet Take 4 mg by mouth every 8 (eight) hours as needed for nausea or vomiting.   Yes Historical Provider, MD  phenazopyridine (PYRIDIUM) 100 MG tablet Take 100 mg by mouth 2 (two) times daily as needed for pain.   Yes Historical Provider, MD  polyethylene glycol (MIRALAX / GLYCOLAX) packet Take 17 g by mouth daily as needed for mild constipation, moderate constipation or severe constipation. Reported on 02/27/2015   Yes Historical Provider, MD  pravastatin (PRAVACHOL) 40 MG tablet Take 1 tablet (40 mg  total) by mouth every evening. 07/04/14  Yes Jackolyn Confer, MD  Probiotic Product (ALIGN) 4 MG CAPS Take 1 capsule by mouth daily.   Yes Historical Provider, MD  senna (SENOKOT) 8.6 MG TABS tablet Take 2 tablets by mouth daily.   Yes Historical Provider, MD  sodium chloride (OCEAN) 0.65 % SOLN nasal spray Place 1 spray into both nostrils every 2 (two) hours as needed for congestion.   Yes Historical Provider, MD  traMADol (ULTRAM) 50 MG tablet Take 0.5 tablets (25 mg total) by mouth every 8 (eight) hours as needed for moderate pain. 01/27/16  Yes Loletha Grayer, MD  oxybutynin (DITROPAN) 5 MG tablet Take 0.5 tablets (2.5 mg total) by  mouth 2 (two) times daily. 02/26/15   Loney Hering, MD     Results for orders placed or performed during the hospital encounter of 05/13/16 (from the past 48 hour(s))  Basic metabolic panel     Status: Abnormal   Collection Time: 05/13/16  5:03 AM  Result Value Ref Range   Sodium 141 135 - 145 mmol/L   Potassium 4.1 3.5 - 5.1 mmol/L   Chloride 107 101 - 111 mmol/L   CO2 27 22 - 32 mmol/L   Glucose, Bld 131 (H) 65 - 99 mg/dL   BUN 25 (H) 6 - 20 mg/dL   Creatinine, Ser 0.64 0.44 - 1.00 mg/dL   Calcium 9.4 8.9 - 10.3 mg/dL   GFR calc non Af Amer >60 >60 mL/min   GFR calc Af Amer >60 >60 mL/min    Comment: (NOTE) The eGFR has been calculated using the CKD EPI equation. This calculation has not been validated in all clinical situations. eGFR's persistently <60 mL/min signify possible Chronic Kidney Disease.    Anion gap 7 5 - 15  CBC     Status: Abnormal   Collection Time: 05/13/16  5:03 AM  Result Value Ref Range   WBC 13.2 (H) 3.6 - 11.0 K/uL   RBC 4.38 3.80 - 5.20 MIL/uL   Hemoglobin 13.6 12.0 - 16.0 g/dL   HCT 41.0 35.0 - 47.0 %   MCV 93.6 80.0 - 100.0 fL   MCH 31.2 26.0 - 34.0 pg   MCHC 33.3 32.0 - 36.0 g/dL   RDW 12.7 11.5 - 14.5 %   Platelets 239 150 - 440 K/uL  Troponin I     Status: None   Collection Time: 05/13/16  5:03 AM  Result  Value Ref Range   Troponin I <0.03 <0.03 ng/mL  Urinalysis, Complete w Microscopic     Status: Abnormal   Collection Time: 05/13/16  5:41 AM  Result Value Ref Range   Color, Urine YELLOW (A) YELLOW   APPearance CLOUDY (A) CLEAR   Specific Gravity, Urine 1.013 1.005 - 1.030   pH 5.0 5.0 - 8.0   Glucose, UA NEGATIVE NEGATIVE mg/dL   Hgb urine dipstick SMALL (A) NEGATIVE   Bilirubin Urine NEGATIVE NEGATIVE   Ketones, ur NEGATIVE NEGATIVE mg/dL   Protein, ur NEGATIVE NEGATIVE mg/dL   Nitrite NEGATIVE NEGATIVE   Leukocytes, UA LARGE (A) NEGATIVE   RBC / HPF 6-30 0 - 5 RBC/hpf   WBC, UA TOO NUMEROUS TO COUNT 0 - 5 WBC/hpf   Bacteria, UA RARE (A) NONE SEEN   Squamous Epithelial / LPF 0-5 (A) NONE SEEN   WBC Clumps PRESENT    Mucous PRESENT    Dg Chest Portable 1 View  Result Date: 05/13/2016 CLINICAL DATA:  83 year old female with chest pain. EXAM: PORTABLE CHEST 1 VIEW COMPARISON:  Chest CT dated 01/25/2016 FINDINGS: Mild diffuse interstitial coarsening. Faint streaky density involving the lingula may represent atelectatic changes. Developing infiltrate is not excluded. Clinical correlation is recommended. PA and lateral views of the chest may provide better evaluation. There is no pleural effusion or pneumothorax. The cardiac silhouette is within normal limits. There is osteopenia with degenerative changes of the left shoulder. No acute osseous pathology. IMPRESSION: Faint streaky lingular density is, likely atelectatic changes. Developing infiltrate is less likely. Clinical correlation is recommended. Electronically Signed   By: Anner Crete M.D.   On: 05/13/2016 05:38    Review of Systems  Constitutional: Negative for chills and fever.  HENT: Negative for sore throat and tinnitus.   Eyes: Negative for blurred vision and redness.  Respiratory: Negative for cough and shortness of breath.   Cardiovascular: Positive for chest pain. Negative for palpitations, orthopnea and PND.   Gastrointestinal: Negative for abdominal pain, diarrhea, nausea and vomiting.  Genitourinary: Negative for dysuria, frequency and urgency.  Musculoskeletal: Negative for joint pain and myalgias.  Skin: Negative for rash.       No lesions  Neurological: Negative for speech change, focal weakness and weakness.  Endo/Heme/Allergies: Does not bruise/bleed easily.       No temperature intolerance  Psychiatric/Behavioral: Negative for depression and suicidal ideas.    Blood pressure (!) 123/110, pulse 100, temperature 98.6 F (37 C), temperature source Oral, resp. rate (!) 24, height 5' 5"  (1.651 m), weight 51.7 kg (114 lb), SpO2 94 %. Physical Exam  Vitals reviewed. Constitutional: She is oriented to person, place, and time. She appears well-developed and well-nourished. No distress.  HENT:  Head: Normocephalic and atraumatic.  Mouth/Throat: Oropharynx is clear and moist.  Eyes: Conjunctivae and EOM are normal. Pupils are equal, round, and reactive to light. No scleral icterus.  Neck: Normal range of motion. Neck supple. No JVD present. No tracheal deviation present. No thyromegaly present.  Cardiovascular: Normal rate, regular rhythm and normal heart sounds.  Exam reveals no gallop and no friction rub.   No murmur heard. Respiratory: Effort normal and breath sounds normal.  GI: Soft. Bowel sounds are normal. She exhibits no distension. There is no tenderness.  Genitourinary:  Genitourinary Comments: Deferred  Musculoskeletal: Normal range of motion. She exhibits deformity (hemihypertrophy (right ypper and lower extremity >left)). She exhibits no edema.  Lymphadenopathy:    She has no cervical adenopathy.  Neurological: She is alert and oriented to person, place, and time. No cranial nerve deficit. She exhibits normal muscle tone.  Skin: Skin is warm and dry. No rash noted. No erythema.  Psychiatric: She has a normal mood and affect. Her behavior is normal. Judgment and thought content  normal.     Assessment/Plan This is an 83 year old female admitted for chest pain and hypoxia. 1. Chest pain: Troponin negative and EKG shows no signs of ischemia. The patient has not had heart problems in the past aside from a murmur recognized years ago. Monitor telemetry area and follow cardiac biomarkers. Consult cardiology. 2. Hypoxia: Unclear etiology. Pulmonary embolism found 3 months ago was small and subsegmental. The patient has been on Eliquis since that time. Unlikely that PE has grown in size or become hemodynamically significant but I will order an echocardiogram nonetheless. Supplemental oxygen as needed. Continuous pulse oximetry.  3. Hypertension: The patient carries this diagnosis and is on metoprolol and amlodipine. Pressure is uncontrolled here but I believe that is an artifact of the patient's cough being too small for her right arm which is smaller than the left. Recheck with appropriate size cuff. Continue antihypertensives per home regimen. Labetalol as needed. 4. UTI: Rare bacteria and nitrite negative; may be sterile pyuria. The patient was diagnosed with UTI a few days ago and started on Macrobid which I will continue at this time. No symptoms of sepsis. 5. Hypothyroidism: Continue Synthroid; check TSH 6. Hyperlipidemia: Continue statin therapy 7. DVT prophylaxis: Full dose anticoagulation 8. GI prophylaxis: PPI per home regimen The patient is a DO NOT RESUSCITATE. Time spent on admission orders and patient care approximately 45 minutes  Harrie Foreman, MD 05/13/2016, 7:21 AM

## 2016-05-13 NOTE — Progress Notes (Signed)
Patient admitted to unit. Oriented to room, call bell, and staff. Bed in lowest position. Fall safety plan reviewed. Full assessment to Epic. Skin assessment verified with Particia Nearing RN. Telemetry box verification with tele clerk and Orpah Melter NT. Will continue to monitor.  Patient/family requesting to wear briefs - educated.

## 2016-05-13 NOTE — ED Provider Notes (Signed)
[facility] Emergency Department Provider Note   ____________________________________________   First MD Initiated Contact with Patient 05/13/16 [phone]     (approximate)  I have reviewed the triage vital signs and the nursing notes.   HISTORY  Chief Complaint Chest Pain  History obtained via patient and her daughters  HPI Krista Ellis is a 83 y.o. female brought to the ED from skilled nursing facility by EMS with a chief complaint of chest pain and shortness of breath. Patient with a history ofhypertension, bradycardia, pulmonary embolism currently on Eloquis who awoke with central chest pressure and shortness of breath. EMS administered aspirin and nitroglycerin spray en route to the ED. EMS reports initial room air saturation 88%. Reports patient's heart rate dropped from 70-20 after nitroglycerin spray. Without intervention, patient's heart rate rebounded and is currently in the 90s. Daughters deny recent fever, abdominal pain, nausea, vomiting, diaphoresis. They suspect patient has a UTI. Nursing facility ran a urine specimen before the weekend but daughters do not yet know the results.   Past Medical History:  Diagnosis Date  . Arthritis   . Bleeding disorder ([place])   . Chronic cystitis   . Collagen vascular disease ([place])   . CVA (cerebral infarction) 2003  . Dysuria   . Gross hematuria   . Hematuria   . Hemihypertrophy   . History of kidney stones   . Hyperlipidemia   . Hypertension   . Murmur, cardiac   . Myocardial infarction ([place])   . Peripheral vascular disease ([place])    s/p stent right leg  . Pulmonary nodule    stable on Chest CT March 2014, [facility]  . Recurrent UTI   . Sensory urge incontinence   . Thyroid disease   . Urinary frequency     Patient Active Problem List   Diagnosis Date Noted  . Chest pain 05/13/2016  . Acid reflux 04/13/2015  . Combined fat and carbohydrate induced hyperlipemia 04/13/2015  . Dysuria  03/01/2015  . Atrophic vaginitis 03/01/2015  . Microscopic hematuria 03/01/2015  . Bladder spasms 03/01/2015  . Pulmonary embolism ([place]) 09/22/2014  . UTI (urinary tract infection) 09/14/2014  . Enterococcus UTI 09/14/2014  . Generalized weakness 09/14/2014  . Sepsis ([place]) 09/10/2014  . Open wound of knee, leg (except thigh), and ankle, complicated 59/56/3875  . Buttock pain 07/19/2014  . Hospital discharge follow-up 07/04/2014  . Facial droop   . Malnutrition of moderate degree ([place]) 05/21/2014  . Seizures ([place])   . TIA (transient ischemic attack) 05/18/2014  . Systolic murmur 64/33/2951  . Gait disturbance 05/15/2014  . Benign essential HTN 05/08/2014  . History of subdural hematoma   . Subdural hematoma ([place]) 05/05/2014  . B12 deficiency 03/09/2014  . Bradycardia 02/24/2014  . Arteriosclerosis of coronary artery 02/23/2014  . Recurrent UTI 01/03/2014  . Bilateral hand numbness 01/03/2014  . Nephrolithiasis 11/18/2013  . Recurrent falls 11/18/2013  . Closed rib fracture 09/25/2013  . Abnormal CT scan, chest 09/01/2013  . Chest wall pain 08/31/2013  . Hematoma and contusion 08/31/2013  . Atypical chest pain 06/17/2013  . Peripheral vascular disease ([place]) 06/17/2013  . Heart valve disease 06/17/2013  . Atherosclerotic peripheral vascular disease ([place]) 06/01/2013  . Bladder wall hemorrhage 12/10/2012  . Dysphagia, pharyngoesophageal phase 12/10/2012  . Malaise and fatigue 12/10/2012  . Frank hematuria 12/02/2012  . Bladder retention 12/02/2012  . Postmenopausal estrogen deficiency 11/30/2012  . Glucose found in urine on examination 11/30/2012  . Post menopausal  syndrome 11/30/2012  . Type 2 diabetes mellitus with hyperglycemia ([place]) 11/24/2012  . Lung nodule, solitary 03/12/2012  . Diverticular disease of large intestine 12/25/2011  . 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 Symptoms involving urinary system 12/04/2011  . Medicare annual wellness  visit, subsequent 10/01/2011  . Dyslipidemia 10/01/2011  . HLD (hyperlipidemia) 10/01/2011  . Encounter for general adult medical examination without abnormal findings 10/01/2011  . Bladder infection, chronic 09/29/2011  . Incomplete bladder emptying 09/29/2011  . Urge incontinence of urine 09/29/2011  . Delayed onset of urination 09/29/2011  . Hypertension 10/17/2010  . Hypothyroidism 10/17/2010  . Osteoarthritis 10/17/2010  . Essential (primary) hypertension 10/17/2010  . Arthritis, degenerative 10/17/2010  . CN (constipation) 05/02/2010    Past Surgical History:  Procedure Laterality Date  . ABDOMINAL HYSTERECTOMY  1982  . APPENDECTOMY    . CARDIAC SURGERY     2 failed stents in right leg  . CARPAL TUNNEL RELEASE    . HIP SURGERY  2007   replaced ball in right hip  . left rotator cuff     repair  . ruptured disc    . SPINE SURGERY     steel rod in back  . stent placement leg Right 2015  . TONSILLECTOMY    . TOTAL HIP ARTHROPLASTY     right  . TOTAL KNEE ARTHROPLASTY Left     Prior to Admission medications   Medication Sig Start Date End Date Taking? Authorizing Provider  acetaminophen (TYLENOL) 325 MG tablet Take 325 mg by mouth every 4 (four) hours as needed for mild pain, moderate pain, fever or headache. Reported on 02/27/2015   Yes Historical Provider, MD  acetaminophen (TYLENOL) 650 MG CR tablet Take 650 mg by mouth every 8 (eight) hours.   Yes Historical Provider, MD  amLODipine (NORVASC) 5 MG tablet Take 5 mg by mouth daily.   Yes Historical Provider, MD  apixaban (ELIQUIS) 5 MG TABS tablet Two tablets twice a day for six days then one tablet twice a day afterwards Patient taking differently: Take 5 mg by mouth 2 (two) times daily.  01/27/16  Yes Loletha Grayer, MD  Ascorbic Acid (VITAMIN C) 1000 MG tablet Take 1 tablet (1,000 mg total) by mouth daily. 02/13/16  Yes Shannon A McGowan, PA-C  bisacodyl (DULCOLAX) 10 MG suppository Place 10 mg rectally daily as needed  for moderate constipation.   Yes Historical Provider, MD  cetirizine (ZYRTEC) 10 MG tablet Take 10 mg by mouth daily.   Yes Historical Provider, MD  Cholecalciferol (VITAMIN D3) 50000 units CAPS Take 50,000 Units by mouth every 30 (thirty) days. Patient takes on the 13th   Yes Historical Provider, MD  Cranberry 425 MG CAPS Take 425 mg by mouth daily.   Yes Historical Provider, MD  cyanocobalamin (,VITAMIN B-12,) 1000 MCG/ML injection Inject 1,000 mcg into the muscle every 30 (thirty) days. On the 2nd of each month   Yes Historical Provider, MD  dextromethorphan-guaiFENesin (ROBITUSSIN-DM) 10-100 MG/5ML liquid Take 10 mLs by mouth every 4 (four) hours as needed for cough.   Yes Historical Provider, MD  hydrOXYzine (ATARAX/VISTARIL) 25 MG tablet Take 12.5 mg by mouth 3 (three) times daily as needed for itching.   Yes Historical Provider, MD  levothyroxine (SYNTHROID, LEVOTHROID) 25 MCG tablet TAKE 1 TABLET (25 MCG TOTAL) BY MOUTH DAILY. Patient taking differently: Take 25 mcg by mouth daily.  07/04/14  Yes Jackolyn Confer, MD  magnesium hydroxide (MILK OF MAGNESIA) 400 MG/5ML  suspension Take 30 mLs by mouth daily as needed for mild constipation or moderate constipation.   Yes Historical Provider, MD  menthol-cetylpyridinium (CEPACOL) 3 MG lozenge Take 1 lozenge by mouth as needed for sore throat.   Yes Historical Provider, MD  metoprolol succinate (TOPROL-XL) 25 MG 24 hr tablet Take 12.5 mg by mouth daily.    Yes Historical Provider, MD  nitrofurantoin, macrocrystal-monohydrate, (MACROBID) 100 MG capsule Take 100 mg by mouth 2 (two) times daily. 05/12/16 05/15/16 Yes Historical Provider, MD  omeprazole (PRILOSEC) 20 MG capsule Take 20 mg by mouth daily.   Yes Historical Provider, MD  ondansetron (ZOFRAN) 4 MG tablet Take 4 mg by mouth every 8 (eight) hours as needed for nausea or vomiting.   Yes Historical Provider, MD  phenazopyridine (PYRIDIUM) 100 MG tablet Take 100 mg by mouth 2 (two) times daily as  needed for pain.   Yes Historical Provider, MD  polyethylene glycol (MIRALAX / GLYCOLAX) packet Take 17 g by mouth daily as needed for mild constipation, moderate constipation or severe constipation. Reported on 02/27/2015   Yes Historical Provider, MD  pravastatin (PRAVACHOL) 40 MG tablet Take 1 tablet (40 mg total) by mouth every evening. 07/04/14  Yes Jackolyn Confer, MD  Probiotic Product (ALIGN) 4 MG CAPS Take 1 capsule by mouth daily.   Yes Historical Provider, MD  senna (SENOKOT) 8.6 MG TABS tablet Take 2 tablets by mouth daily.   Yes Historical Provider, MD  sodium chloride (OCEAN) 0.65 % SOLN nasal spray Place 1 spray into both nostrils every 2 (two) hours as needed for congestion.   Yes Historical Provider, MD  traMADol (ULTRAM) 50 MG tablet Take 0.5 tablets (25 mg total) by mouth every 8 (eight) hours as needed for moderate pain. 01/27/16  Yes Loletha Grayer, MD  oxybutynin (DITROPAN) 5 MG tablet Take 0.5 tablets (2.5 mg total) by mouth 2 (two) times daily. 02/26/15   Loney Hering, MD    Allergies Sulfa antibiotics; Ciprofloxacin; Levofloxacin; and Penicillins  Family History  Problem Relation Age of Onset  . Hypertension Mother   . Transient ischemic attack Mother   . Kidney disease Neg Hx   . Bladder Cancer Neg Hx   . Prostate cancer Neg Hx     Social History Social History  Substance Use Topics  . Smoking status: Never Smoker  . Smokeless tobacco: Never Used  . Alcohol use No    Review of Systems  Constitutional: No fever/chills. Eyes: No visual changes. ENT: No sore throat. Cardiovascular: Positive for chest pain. Respiratory: Positive for shortness of breath. Gastrointestinal: No abdominal pain.  No nausea, no vomiting.  No diarrhea.  No constipation. Genitourinary: Negative for dysuria. Musculoskeletal: Negative for back pain. Skin: Negative for rash. Neurological: Negative for headaches, focal weakness or  numbness.   ____________________________________________   PHYSICAL EXAM:  VITAL SIGNS: ED Triage Vitals  Enc Vitals Group     BP 05/13/16 0505 (!) 153/61     Pulse Rate 05/13/16 0505 (!) 109     Resp 05/13/16 0505 (!) 29     Temp 05/13/16 0505 98.6 F (37 C)     Temp Source 05/13/16 0505 Oral     SpO2 05/13/16 0505 93 %     Weight 05/13/16 0501 114 lb (51.7 kg)     Height 05/13/16 0501 5\' 5"  (1.651 m)     Head Circumference --      Peak Flow --      Pain Score 05/13/16 0500  4     Pain Loc --      Pain Edu? --      Excl. in [place]? --     Constitutional: Alert and oriented. Frail appearing and in no acute distress. Eyes: Conjunctivae are normal. PERRL. EOMI. Head: Atraumatic. Nose: No congestion/rhinnorhea. Mouth/Throat: Mucous membranes are moist.  Oropharynx non-erythematous. Neck: No stridor.   Cardiovascular: Normal rate, regular rhythm. II/VI SEM.  Good peripheral circulation. Respiratory: Increased respiratory effort.  No retractions. Lungs CTAB. Gastrointestinal: Soft and nontender. No distention. No abdominal bruits. No CVA tenderness. Musculoskeletal: No lower extremity tenderness nor edema.  No joint effusions. Neurologic:  Normal speech and language. No gross focal neurologic deficits are appreciated.  Skin:  Skin is warm, dry and intact. No rash noted. Psychiatric: Mood and affect are normal. Speech and behavior are normal.  ____________________________________________   LABS (all labs ordered are listed, but only abnormal results are displayed)  Labs Reviewed  BASIC METABOLIC PANEL - Abnormal; Notable for the following:       Result Value   Glucose, Bld 131 (*)    BUN 25 (*)    All other components within normal limits  CBC - Abnormal; Notable for the following:    WBC 13.2 (*)    All other components within normal limits  URINALYSIS, COMPLETE (UACMP) WITH MICROSCOPIC - Abnormal; Notable for the following:    Color, Urine YELLOW (*)    APPearance  CLOUDY (*)    Hgb urine dipstick SMALL (*)    Leukocytes, UA LARGE (*)    Bacteria, UA RARE (*)    Squamous Epithelial / LPF 0-5 (*)    All other components within normal limits  URINE CULTURE  TROPONIN I   ____________________________________________  EKG  ED ECG REPORT I, SUNG,JADE J, the attending physician, personally viewed and interpreted this ECG.   Date: 05/13/2016  EKG Time: 0502  Rate: 115  Rhythm: sinus tachycardia  Axis: Normal  Intervals:none  ST&T Change: Nonspecific ____________________________________________  RADIOLOGY  Portable chest x-ray (viewed by me, interpreted per Dr. Quintella Reichert): Faint streaky lingular density is, likely atelectatic changes.  Developing infiltrate is less likely. Clinical correlation is  recommended.   ____________________________________________   PROCEDURES  Procedure(s) performed: None  Procedures  Critical Care performed: Yes, see critical care note(s)   CRITICAL CARE Performed by: Paulette Blanch   Total critical care time: 30 minutes  Critical care time was exclusive of separately billable procedures and treating other patients.  Critical care was necessary to treat or prevent imminent or life-threatening deterioration.  Critical care was time spent personally by me on the following activities: development of treatment plan with patient and/or surrogate as well as nursing, discussions with consultants, evaluation of patient's response to treatment, examination of patient, obtaining history from patient or surrogate, ordering and performing treatments and interventions, ordering and review of laboratory studies, ordering and review of radiographic studies, pulse oximetry and re-evaluation of patient's condition.  ____________________________________________   INITIAL IMPRESSION / ASSESSMENT AND PLAN / ED COURSE  Pertinent labs & imaging results that were available during my care of the patient were reviewed by me and  considered in my medical decision making (see chart for details).  83 year old female sent from skilled nursing facility with a chief complaint of chest pain and shortness of breath. Room-air saturations 92%; 2 L nasal cannula oxygen applied. Hold nitroglycerin per EMS reports of profound bradycardia after administration. Will check screening lab work, chest x-ray and reassess. Anticipate hospitalization for  hypoxia and transient reported profound bradycardia. Patient is on Eloquis for small pulmonary embolism diagnosed 3 months ago. She should be sufficiently anticoagulated.  Clinical Course as of May 13 657  Tue May 13, 2016  0658 Urine culture added. Discussed with hospitalist who recommends holding antibiotics for now.  [JS]    Clinical Course User Index [JS] Paulette Blanch, MD     ____________________________________________   FINAL CLINICAL IMPRESSION(S) / ED DIAGNOSES  Final diagnoses:  Chest pain, unspecified type  SOB (shortness of breath)  Urinary tract infection without hematuria, site unspecified      NEW MEDICATIONS STARTED DURING THIS VISIT:  New Prescriptions   No medications on file     Note:  This document was prepared using Dragon voice recognition software and may include unintentional dictation errors.    Paulette Blanch, MD 05/13/16 [phone]

## 2016-05-13 NOTE — ED Notes (Signed)
Report called to floor, recvg RN is [employer].

## 2016-05-13 NOTE — Care Management (Signed)
Patient admitted with chest pain from [facility].  Patient says she is from assisted living and her daughter says patient is on the third floor of the health care building.  Confirmed patient is long term care resident in the [facility] skilled nursing facility. Sande Rives will be covering for Seth Bake after today.  Contact information- [phone].   UPdated CSW.

## 2016-05-13 NOTE — Telephone Encounter (Signed)
Had chest pain ER evaluation benign but admitted to be sure nothing going on

## 2016-05-13 NOTE — Telephone Encounter (Signed)
PLEASE NOTE: All timestamps contained within this report are represented as [place] Standard Time. CONFIDENTIALTY NOTICE: This fax transmission is intended only for the addressee. It contains information that is legally privileged, confidential or otherwise protected from use or disclosure. If you are not the intended recipient, you are strictly prohibited from reviewing, disclosing, copying using or disseminating any of this information or taking any action in reliance on or regarding this information. If you have received this fax in error, please notify us immediately by telephone so that we can arrange for its return to [place]. Phone: [phone], Toll-Free: [phone], Fax: [phone] Page: 1 of 1 Call Id: [id number] [place] Patient Name: Krista Ellis Gender: Female DOB: 05-14-1930 Age: 34 Y 11 M 9 D Return Phone Number: [phone] (Primary) City/State/Zip: [place] Client [place] [facility] - Client Client Site [place] - Night Physician Viviana Simpler - MD Who Is Calling Patient / Member / Family / Caregiver Call Type Triage / Clinical Caller Name Vickie Relationship To Patient Care Giver Return Phone Number [phone] (Primary) Chief Complaint Paging or Request for Consult Reason for Call Symptomatic / Request for Health Information Initial Comment Caller is calling from [facility]. Olegario Shearer stated there is a resident that is being sent to the hospital and they need an order.Please call her @ [phone] Nurse Assessment Guidelines Guideline Title Affirmed Question Disp. Time Eilene Ghazi Time) Disposition Final User 05/13/2016 4:38:13 AM Clinical Call Yes Yolanda Bonine. RN, Marisa Sprinkles DoctorName Phone DateTime Action Result/Outcome Notes Scarlette Calico - MD [phone] 05/13/2016 4:21:21 AM Doctor Paged Paged On Call Back to  Call Center [phone] [facility] RN Ross Scarlette Calico - MD [phone] 05/13/2016 4:31:57 AM Doctor Paged Paged On Call Back to Call Center Please call [facility] @ [place] Scarlette Calico - MD [phone] 05/13/2016 4:37:10 AM Doctor Paged Called On Call Provider - Rae Mar - MD 05/13/2016 4:37:53 AM Message Result Spoke with On Call - General Warm transferred Doctor Ronnald Ramp to [facility] L[facility] from [facility].

## 2016-05-14 LAB — URINE CULTURE

## 2016-05-14 LAB — HEMOGLOBIN A1C
Hgb A1c MFr Bld: 5.3 % (ref 4.8–5.6)
Mean Plasma Glucose: 105 mg/dL

## 2016-05-14 LAB — ECHOCARDIOGRAM COMPLETE
Height: 65 in
Weight: 1824 oz

## 2016-05-14 NOTE — Consult Note (Signed)
Reason for Consult: Chest pain possible angina known coronary disease Referring Physician: Dr. Darvin Neighbours hospitalist  Krista Ellis is an 83 y.o. female.  HPI: Patient's 83 year old female known history of peripheral vascular disease hypertension hyperlipidemia previous CVA complains of recurrent symptoms of cystitis this time with chest pain symptoms. Patient awoke morning with dull aching chest discomfort minimal chest shortness of breath denied any nausea vomiting or diaphoresis. Patient was brought in by EMS given aspirin and nitroglycerin heart rate reportedly dropped mention department she was hypoxic in the 80s she had chest discomfort which subsequently improved bolus was then admitted for further assessment and evaluation  Past Medical History:  Diagnosis Date  . Arthritis   . Bleeding disorder ([place])   . Chronic cystitis   . Collagen vascular disease ([place])   . CVA (cerebral infarction) 2003  . Dysuria   . Gross hematuria   . Hematuria   . Hemihypertrophy   . History of kidney stones   . Hyperlipidemia   . Hypertension   . Murmur, cardiac   . Myocardial infarction ([place])   . Peripheral vascular disease ([place])    s/p stent right leg  . Pulmonary nodule    stable on Chest CT March 2014, Followed at [facility]  . Recurrent UTI   . Sensory urge incontinence   . Thyroid disease   . Urinary frequency     Past Surgical History:  Procedure Laterality Date  . ABDOMINAL HYSTERECTOMY  1982  . APPENDECTOMY    . CARDIAC SURGERY     2 failed stents in right leg  . CARPAL TUNNEL RELEASE    . HIP SURGERY  2007   replaced ball in right hip  . left rotator cuff     repair  . ruptured disc    . SPINE SURGERY     steel rod in back  . stent placement leg Right 2015  . TONSILLECTOMY    . TOTAL HIP ARTHROPLASTY     right  . TOTAL KNEE ARTHROPLASTY Left     Family History  Problem Relation Age of Onset  . Hypertension Mother   . Transient ischemic attack Mother   . Kidney disease  Neg Hx   . Bladder Cancer Neg Hx   . Prostate cancer Neg Hx     Social History:  reports that she has never smoked. She has never used smokeless tobacco. She reports that she does not drink alcohol or use drugs.  Allergies:  Allergies  Allergen Reactions  . Sulfa Antibiotics Hives and Itching  . Ciprofloxacin Hives and Itching  . Levofloxacin Hives and Itching  . Penicillins Hives and Itching    Medications: I have reviewed the patient's current medications.  Results for orders placed or performed during the hospital encounter of 05/13/16 (from the past 48 hour(s))  Basic metabolic panel     Status: Abnormal   Collection Time: 05/13/16  5:03 AM  Result Value Ref Range   Sodium 141 135 - 145 mmol/L   Potassium 4.1 3.5 - 5.1 mmol/L   Chloride 107 101 - 111 mmol/L   CO2 27 22 - 32 mmol/L   Glucose, Bld 131 (H) 65 - 99 mg/dL   BUN 25 (H) 6 - 20 mg/dL   Creatinine, Ser 0.64 0.44 - 1.00 mg/dL   Calcium 9.4 8.9 - 10.3 mg/dL   GFR calc non Af Amer >60 >60 mL/min   GFR calc Af Amer >60 >60 mL/min    Comment: (NOTE) The eGFR  has been calculated using the CKD EPI equation. This calculation has not been validated in all clinical situations. eGFR's persistently <60 mL/min signify possible Chronic Kidney Disease.    Anion gap 7 5 - 15  CBC     Status: Abnormal   Collection Time: 05/13/16  5:03 AM  Result Value Ref Range   WBC 13.2 (H) 3.6 - 11.0 K/uL   RBC 4.38 3.80 - 5.20 MIL/uL   Hemoglobin 13.6 12.0 - 16.0 g/dL   HCT 41.0 35.0 - 47.0 %   MCV 93.6 80.0 - 100.0 fL   MCH 31.2 26.0 - 34.0 pg   MCHC 33.3 32.0 - 36.0 g/dL   RDW 12.7 11.5 - 14.5 %   Platelets 239 150 - 440 K/uL  Troponin I     Status: None   Collection Time: 05/13/16  5:03 AM  Result Value Ref Range   Troponin I <0.03 <0.03 ng/mL  Urinalysis, Complete w Microscopic     Status: Abnormal   Collection Time: 05/13/16  5:41 AM  Result Value Ref Range   Color, Urine YELLOW (A) YELLOW   APPearance CLOUDY (A) CLEAR    Specific Gravity, Urine 1.013 1.005 - 1.030   pH 5.0 5.0 - 8.0   Glucose, UA NEGATIVE NEGATIVE mg/dL   Hgb urine dipstick SMALL (A) NEGATIVE   Bilirubin Urine NEGATIVE NEGATIVE   Ketones, ur NEGATIVE NEGATIVE mg/dL   Protein, ur NEGATIVE NEGATIVE mg/dL   Nitrite NEGATIVE NEGATIVE   Leukocytes, UA LARGE (A) NEGATIVE   RBC / HPF 6-30 0 - 5 RBC/hpf   WBC, UA TOO NUMEROUS TO COUNT 0 - 5 WBC/hpf   Bacteria, UA RARE (A) NONE SEEN   Squamous Epithelial / LPF 0-5 (A) NONE SEEN   WBC Clumps PRESENT    Mucous PRESENT   TSH     Status: None   Collection Time: 05/13/16  8:23 AM  Result Value Ref Range   TSH 2.748 0.350 - 4.500 uIU/mL    Comment: Performed by a 3rd Generation assay with a functional sensitivity of <=0.01 uIU/mL.  Hemoglobin A1c     Status: None   Collection Time: 05/13/16  8:23 AM  Result Value Ref Range   Hgb A1c MFr Bld 5.3 4.8 - 5.6 %    Comment: (NOTE)         Pre-diabetes: 5.7 - 6.4         Diabetes: >6.4         Glycemic control for adults with diabetes: <7.0    Mean Plasma Glucose 105 mg/dL    Comment: (NOTE) Performed At: [facility] [address] [place], [place] [id number] Lindon Romp MD RF:[id number]   Troponin I     Status: None   Collection Time: 05/13/16  8:23 AM  Result Value Ref Range   Troponin I <0.03 <0.03 ng/mL  MRSA PCR Screening     Status: Abnormal   Collection Time: 05/13/16  8:43 AM  Result Value Ref Range   MRSA by PCR POSITIVE (A) NEGATIVE    Comment:        The GeneXpert MRSA Assay (FDA approved for NASAL specimens only), is one component of a comprehensive MRSA colonization surveillance program. It is not intended to diagnose MRSA infection nor to guide or monitor treatment for MRSA infections. CRITICAL RESULT CALLED TO, READ BACK BY AND VERIFIED WITH: TAYLOR [email or web address]  ON 05/13/16 BY HKP   Troponin I     Status: None   Collection Time:  05/13/16  2:19 PM  Result Value Ref Range   Troponin I <0.03 <0.03 ng/mL   Troponin I     Status: None   Collection Time: 05/13/16  8:31 PM  Result Value Ref Range   Troponin I <0.03 <0.03 ng/mL    Dg Chest Portable 1 View  Result Date: 05/13/2016 CLINICAL DATA:  83 year old female with chest pain. EXAM: PORTABLE CHEST 1 VIEW COMPARISON:  Chest CT dated 01/25/2016 FINDINGS: Mild diffuse interstitial coarsening. Faint streaky density involving the lingula may represent atelectatic changes. Developing infiltrate is not excluded. Clinical correlation is recommended. PA and lateral views of the chest may provide better evaluation. There is no pleural effusion or pneumothorax. The cardiac silhouette is within normal limits. There is osteopenia with degenerative changes of the left shoulder. No acute osseous pathology. IMPRESSION: Faint streaky lingular density is, likely atelectatic changes. Developing infiltrate is less likely. Clinical correlation is recommended. Electronically Signed   By: Anner Crete M.Ellis.   On: 05/13/2016 05:38    Review of Systems  Constitutional: Positive for malaise/fatigue.  HENT: Positive for congestion.   Eyes: Negative.   Respiratory: Positive for shortness of breath.   Cardiovascular: Positive for chest pain.  Gastrointestinal: Negative.   Genitourinary: Positive for dysuria, frequency, hematuria and urgency.  Musculoskeletal: Positive for myalgias.  Skin: Negative.   Neurological: Positive for weakness.  Endo/Heme/Allergies: Negative.   Psychiatric/Behavioral: Negative.    Blood pressure (!) 146/59, pulse 97, temperature 97.8 F (36.6 C), temperature source Oral, resp. rate 16, height _0  (1.651 m), weight 52 kg (114 lb 10.4 oz), SpO2 92 %. Physical Exam  Nursing note and vitals reviewed. Constitutional: She is oriented to person, place, and time. She appears well-developed and well-nourished.  HENT:  Head: Normocephalic and atraumatic.  Eyes: EOM are normal. Pupils are equal, round, and reactive to light.  Neck: Normal  range of motion. Neck supple.  Cardiovascular: Normal rate, regular rhythm and normal heart sounds.   Respiratory: Effort normal and breath sounds normal.  GI: Soft.  Musculoskeletal: Normal range of motion.  Neurological: She is alert and oriented to person, place, and time. She has normal reflexes.  Skin: Skin is warm and dry.  Psychiatric: She has a normal mood and affect.    Assessment/Plan: Chest pain History of pulmonary embolus History of CVA Dysuria Hyperlipidemia Hypertension Murmur Peripheral vascular disease . PLAN Rule out for myocardial infarction including EKGs and troponins Supplemental oxygen as necessary for hypoxemia Continue hypertension control metoprolol amlodipine Continue treatment for chronic UTI cystitis Levothyroxine for hypothyroidism continue Pravachol for hyperlipidemia GI prophylaxis With omeprazole O Protonix Consider functional study possibly Coumadin as an outpatient do not recommend cardiac catheter    Krista Ellis Reznor Ferrando 05/14/2016, 7:27 AM

## 2016-05-14 NOTE — Progress Notes (Signed)
[facility] NAME: Suhey Radford    MR#:  [id number]  DATE OF BIRTH:  August 10, 1930  SUBJECTIVE:  CHIEF COMPLAINT:   Chief Complaint  Patient presents with  . Chest Pain   Vague epigastric /chest pain  REVIEW OF SYSTEMS:    Review of Systems  Constitutional: Positive for malaise/fatigue. Negative for chills and fever.  HENT: Negative for sore throat.   Eyes: Negative for blurred vision, double vision and pain.  Respiratory: Negative for cough, hemoptysis, shortness of breath and wheezing.   Cardiovascular: Positive for chest pain. Negative for palpitations, orthopnea and leg swelling.  Gastrointestinal: Negative for abdominal pain, constipation, diarrhea, heartburn, nausea and vomiting.  Genitourinary: Positive for dysuria. Negative for hematuria.  Musculoskeletal: Negative for back pain and joint pain.  Skin: Negative for rash.  Neurological: Positive for weakness. Negative for sensory change, speech change, focal weakness and headaches.  Endo/Heme/Allergies: Does not bruise/bleed easily.  Psychiatric/Behavioral: Negative for depression. The patient is not nervous/anxious.     DRUG ALLERGIES:   Allergies  Allergen Reactions  . Sulfa Antibiotics Hives and Itching  . Ciprofloxacin Hives and Itching  . Levofloxacin Hives and Itching  . Penicillins Hives and Itching    VITALS:  Blood pressure (!) 114/40, pulse 69, temperature 98.3 F (36.8 C), temperature source Oral, resp. rate 19, height 5\' 5"  (1.651 m), weight 52 kg (114 lb 10.4 oz), SpO2 92 %.  PHYSICAL EXAMINATION:   Physical Exam  GENERAL:  83 y.o.-year-old patient lying in the bed with no acute distress.  EYES: Pupils equal, round, reactive to light and accommodation. No scleral icterus. Extraocular muscles intact.  HEENT: Head atraumatic, normocephalic. Oropharynx and nasopharynx clear.  NECK:  Supple, no jugular venous distention. No thyroid enlargement, no  tenderness.  LUNGS: Normal breath sounds bilaterally, no wheezing, rales, rhonchi. No use of accessory muscles of respiration.  CARDIOVASCULAR: S1, S2 normal. No murmurs, rubs, or gallops.  ABDOMEN: Soft, epigastric tenderness, nondistended. Bowel sounds present. No organomegaly or mass.  EXTREMITIES: No cyanosis, clubbing or edema b/l.    NEUROLOGIC: Cranial nerves II through XII are intact. No focal Motor or sensory deficits b/l.   PSYCHIATRIC: The patient is alert and oriented x 3.  SKIN: No obvious rash, lesion, or ulcer.   LABORATORY PANEL:   CBC  Recent Labs Lab 05/13/16 0503  WBC 13.2*  HGB 13.6  HCT 41.0  PLT 239   ------------------------------------------------------------------------------------------------------------------ Chemistries   Recent Labs Lab 05/13/16 0503  NA 141  K 4.1  CL 107  CO2 27  GLUCOSE 131*  BUN 25*  CREATININE 0.64  CALCIUM 9.4   ------------------------------------------------------------------------------------------------------------------  Cardiac Enzymes  Recent Labs Lab 05/13/16 2031  TROPONINI <0.03   ------------------------------------------------------------------------------------------------------------------  RADIOLOGY:  Dg Chest Portable 1 View  Result Date: 05/13/2016 CLINICAL DATA:  83 year old female with chest pain. EXAM: PORTABLE CHEST 1 VIEW COMPARISON:  Chest CT dated 01/25/2016 FINDINGS: Mild diffuse interstitial coarsening. Faint streaky density involving the lingula may represent atelectatic changes. Developing infiltrate is not excluded. Clinical correlation is recommended. PA and lateral views of the chest may provide better evaluation. There is no pleural effusion or pneumothorax. The cardiac silhouette is within normal limits. There is osteopenia with degenerative changes of the left shoulder. No acute osseous pathology. IMPRESSION: Faint streaky lingular density is, likely atelectatic changes. Developing  infiltrate is less likely. Clinical correlation is recommended. Electronically Signed   By: Anner Crete M.D.   On: 05/13/2016 05:38  ASSESSMENT AND PLAN:   * Chest pain, atypical but has significant risk factors Will get stress test tomorrow. Troponin and KEG negative Could be GERD Started on Protonix  * Recent PE On Eliquis  * Recurrent uti On macrobid  All the records are reviewed and case discussed with Care Management/Social Worker Management plans discussed with the patient, family and they are in agreement.  CODE STATUS: DNR  DVT Prophylaxis: SCDs  TOTAL TIME TAKING CARE OF THIS PATIENT: 35 minutes.   POSSIBLE D/C IN 1-2 DAYS, DEPENDING ON CLINICAL CONDITION.  Hillary Bow R M.D on 05/14/2016 at 8:04 PM  Between 7am to 6pm - Pager - [phone]  After 6pm go to [email or web address] - password [email or web address] [facility]  Office  [phone]  CC: Primary care physician; Viviana Simpler, MD  Note: This dictation was prepared with Dragon dictation along with smaller phrase technology. Any transcriptional errors that result from this process are unintentional.

## 2016-05-15 ENCOUNTER — Encounter: Payer: Self-pay | Admitting: Radiology

## 2016-05-15 ENCOUNTER — Inpatient Hospital Stay: Payer: Medicare Other

## 2016-05-15 DIAGNOSIS — J690 Pneumonitis due to inhalation of food and vomit: Secondary | ICD-10-CM | POA: Diagnosis present

## 2016-05-15 LAB — NM MYOCAR MULTI W/SPECT W/WALL MOTION / EF
CHL CUP NUCLEAR SSS: 0
CHL CUP RESTING HR STRESS: 88 {beats}/min
CSEPEDS: 0 s
CSEPHR: 80 %
CSEPPHR: 110 {beats}/min
Estimated workload: 1 METS
Exercise duration (min): 1 min
LV dias vol: 60 mL (ref 46–106)
LV sys vol: 19 mL
MPHR: 137 {beats}/min
NUC STRESS TID: 1.08
SDS: 0
SRS: 0

## 2016-05-15 MED ORDER — LEVOFLOXACIN 500 MG PO TABS: 500.0000 mg | ORAL_TABLET | Freq: Every day | ORAL | 0 refills | Status: DC

## 2016-05-15 MED ORDER — TECHNETIUM TC 99M TETROFOSMIN IV KIT
13.0000 | PACK | Freq: Once | INTRAVENOUS | Status: AC | PRN
  Administered 2016-05-15: 12.777 via INTRAVENOUS

## 2016-05-15 MED ORDER — LEVOFLOXACIN 500 MG PO TABS
500.0000 mg | ORAL_TABLET | Freq: Once | ORAL | Status: AC
  Administered 2016-05-15: 500 mg via ORAL
  Filled 2016-05-15: qty 1

## 2016-05-15 MED ORDER — OMEPRAZOLE 20 MG PO CPDR: 20.0000 mg | DELAYED_RELEASE_CAPSULE | Freq: Two times a day (BID) | ORAL | 0 refills | Status: DC

## 2016-05-15 MED ORDER — TECHNETIUM TC 99M TETROFOSMIN IV KIT
30.4880 | PACK | Freq: Once | INTRAVENOUS | Status: AC | PRN
  Administered 2016-05-15: 30.488 via INTRAVENOUS

## 2016-05-15 MED ORDER — REGADENOSON 0.4 MG/5ML IV SOLN
0.4000 mg | Freq: Once | INTRAVENOUS | Status: AC
  Administered 2016-05-15: 0.4 mg via INTRAVENOUS

## 2016-05-15 NOTE — Discharge Summary (Signed)
[facility] NAME: Krista Ellis    MR#:  [id number]  DATE OF BIRTH:  06/15/1930  DATE OF ADMISSION:  05/13/2016 ADMITTING PHYSICIAN: Harrie Foreman, MD  DATE OF DISCHARGE: 05/15/2016  PRIMARY CARE PHYSICIAN: Viviana Simpler, MD   ADMISSION DIAGNOSIS:  SOB (shortness of breath) [R06.02] Urinary tract infection without hematuria, site unspecified [N39.0] Chest pain, unspecified type [R07.9]  DISCHARGE DIAGNOSIS:  Active Problems:   Chest pain   Aspiration pneumonia of both lower lobes due to gastric secretions ([place])   SECONDARY DIAGNOSIS:   Past Medical History:  Diagnosis Date  . Arthritis   . Bleeding disorder ([place])   . Chronic cystitis   . Collagen vascular disease ([place])   . CVA (cerebral infarction) 2003  . Dysuria   . Gross hematuria   . Hematuria   . Hemihypertrophy   . History of kidney stones   . Hyperlipidemia   . Hypertension   . Murmur, cardiac   . Myocardial infarction ([place])   . Peripheral vascular disease ([place])    s/p stent right leg  . Pulmonary nodule    stable on Chest CT March 2014, [facility] at [facility]  . Recurrent UTI   . Sensory urge incontinence   . Thyroid disease   . Urinary frequency      ADMITTING HISTORY  Chief Complaint: Chest pain  HPI: The patient with past medical history of recurrent cystitis as well as hypertension and pulmonary embolism presents to the emergency department complaining of chest pain. The patient states that she awoke this morning and realized that she had a dull aching pain in the center of her chest. She denies any shortness of breath, nausea, vomiting or diaphoresis. She is unable to cannulate and thus hit her alert button for help. EMS arrived and gave the patient aspirin 325 mg as well as nitroglycerin. She reportedly had a drop in heart rate immediately after the nitroglycerin was administered. Here in the emergency department the patient's heart rate has been  normal but oxygen saturations were initially 88% on room air. She states that her chest discomfort has eased slightly but is still present. It does not radiate. Laboratory evaluation is reassuring but due to persistent chest pain emergency department staff called the hospitalist service for admission.  HOSPITAL COURSE:   * Chest pain, atypical likely due to GERD Troponin normal. EKG showed no acute changes. No arrhythmias on telemetry. Stress test was a low risk scan without any reversible ischemic abnormalities. Her pain is likely due to GERD. Will increase Protonix to twice a day. If there is no improvement and EGD would be warranted but patient is non-ideal candidate for the procedure.  * Basilar atelectasis versus pneumonia. We will start Levaquin. This should also cover for her UTI. Stop Macrobid.  * Recent pulmonary embolism. An echocardiogram was checked and right ventricular pressures are normal. Her saturations are more than 90% on room air on day of discharge.  Patient's other comorbidities remained stable in medications remain unchanged.   CONSULTS OBTAINED:  Treatment Team:  Yolonda Kida, MD  DRUG ALLERGIES:   Allergies  Allergen Reactions  . Sulfa Antibiotics Hives and Itching  . Ciprofloxacin Hives and Itching  . Penicillins Hives and Itching    DISCHARGE MEDICATIONS:   Current Discharge Medication List    START taking these medications   Details  levofloxacin (LEVAQUIN) 500 MG tablet Take 1 tablet (500 mg total) by mouth daily. Qty:  5 tablet, Refills: 0      CONTINUE these medications which have CHANGED   Details  omeprazole (PRILOSEC) 20 MG capsule Take 1 capsule (20 mg total) by mouth 2 (two) times daily before a meal. Qty: 60 capsule, Refills: 0      CONTINUE these medications which have NOT CHANGED   Details  acetaminophen (TYLENOL) 325 MG tablet Take 325 mg by mouth every 4 (four) hours as needed for mild pain, moderate pain, fever or headache.  Reported on 02/27/2015    acetaminophen (TYLENOL) 650 MG CR tablet Take 650 mg by mouth every 8 (eight) hours.    amLODipine (NORVASC) 5 MG tablet Take 5 mg by mouth daily.    apixaban (ELIQUIS) 5 MG TABS tablet Two tablets twice a day for six days then one tablet twice a day afterwards Qty: 72 tablet, Refills: 0    Ascorbic Acid (VITAMIN C) 1000 MG tablet Take 1 tablet (1,000 mg total) by mouth daily. Qty: 90 tablet, Refills: 3    bisacodyl (DULCOLAX) 10 MG suppository Place 10 mg rectally daily as needed for moderate constipation.    cetirizine (ZYRTEC) 10 MG tablet Take 10 mg by mouth daily.    Cholecalciferol (VITAMIN D3) 50000 units CAPS Take 50,000 Units by mouth every 30 (thirty) days. Patient takes on the 13th    Cranberry 425 MG CAPS Take 425 mg by mouth daily.    cyanocobalamin (,VITAMIN B-12,) 1000 MCG/ML injection Inject 1,000 mcg into the muscle every 30 (thirty) days. On the 2nd of each month    dextromethorphan-guaiFENesin (ROBITUSSIN-DM) 10-100 MG/5ML liquid Take 10 mLs by mouth every 4 (four) hours as needed for cough.    hydrOXYzine (ATARAX/VISTARIL) 25 MG tablet Take 12.5 mg by mouth 3 (three) times daily as needed for itching.    levothyroxine (SYNTHROID, LEVOTHROID) 25 MCG tablet TAKE 1 TABLET (25 MCG TOTAL) BY MOUTH DAILY. Qty: 90 tablet, Refills: 1    magnesium hydroxide (MILK OF MAGNESIA) 400 MG/5ML suspension Take 30 mLs by mouth daily as needed for mild constipation or moderate constipation.    menthol-cetylpyridinium (CEPACOL) 3 MG lozenge Take 1 lozenge by mouth as needed for sore throat.    metoprolol succinate (TOPROL-XL) 25 MG 24 hr tablet Take 12.5 mg by mouth daily.     ondansetron (ZOFRAN) 4 MG tablet Take 4 mg by mouth every 8 (eight) hours as needed for nausea or vomiting.    phenazopyridine (PYRIDIUM) 100 MG tablet Take 100 mg by mouth 2 (two) times daily as needed for pain.    polyethylene glycol (MIRALAX / GLYCOLAX) packet Take 17 g by mouth  daily as needed for mild constipation, moderate constipation or severe constipation. Reported on 02/27/2015    pravastatin (PRAVACHOL) 40 MG tablet Take 1 tablet (40 mg total) by mouth every evening. Qty: 90 tablet, Refills: 1    Probiotic Product (ALIGN) 4 MG CAPS Take 1 capsule by mouth daily.    senna (SENOKOT) 8.6 MG TABS tablet Take 2 tablets by mouth daily.    sodium chloride (OCEAN) 0.65 % SOLN nasal spray Place 1 spray into both nostrils every 2 (two) hours as needed for congestion.    traMADol (ULTRAM) 50 MG tablet Take 0.5 tablets (25 mg total) by mouth every 8 (eight) hours as needed for moderate pain. Qty: 30 tablet, Refills: 0      STOP taking these medications     nitrofurantoin, macrocrystal-monohydrate, (MACROBID) 100 MG capsule      oxybutynin (DITROPAN) 5  MG tablet         Today   VITAL SIGNS:  Blood pressure (!) 137/44, pulse 84, temperature 98.4 F (36.9 C), temperature source Oral, resp. rate 16, height 5\' 5"  (1.651 m), weight 60.7 kg (133 lb 12.8 oz), SpO2 94 %.  I/O:   Intake/Output Summary (Last 24 hours) at 05/15/16 1427 Last data filed at 05/15/16 0041  Gross per 24 hour  Intake                0 ml  Output                0 ml  Net                0 ml    PHYSICAL EXAMINATION:  Physical Exam  GENERAL:  83 y.o.-year-old patient lying in the bed with no acute distress.  LUNGS: Basilar crackles left CARDIOVASCULAR: S1, S2 normal. No murmurs, rubs, or gallops.  ABDOMEN: Soft, non-tender, non-distended. Bowel sounds present. No organomegaly or mass.  NEUROLOGIC: Moves all 4 extremities. PSYCHIATRIC: The patient is alert and oriented x 3.  SKIN: No obvious rash, lesion, or ulcer.   DATA REVIEW:   CBC  Recent Labs Lab 05/13/16 0503  WBC 13.2*  HGB 13.6  HCT 41.0  PLT 239    Chemistries   Recent Labs Lab 05/13/16 0503  NA 141  K 4.1  CL 107  CO2 27  GLUCOSE 131*  BUN 25*  CREATININE 0.64  CALCIUM 9.4    Cardiac  Enzymes  Recent Labs Lab 05/13/16 2031  TROPONINI <0.03    Microbiology Results  Results for orders placed or performed during the hospital encounter of 05/13/16  Urine culture     Status: Abnormal   Collection Time: 05/13/16  5:41 AM  Result Value Ref Range Status   Specimen Description URINE, RANDOM  Final   Special Requests NONE  Final   Culture (A)  Final    <10,000 COLONIES/mL INSIGNIFICANT GROWTH Performed at [facility], [place] [address] [place], [place] [address]    Report Status 05/14/2016 FINAL  Final  MRSA PCR Screening     Status: Abnormal   Collection Time: 05/13/16  8:43 AM  Result Value Ref Range Status   MRSA by PCR POSITIVE (A) NEGATIVE Final    Comment:        The GeneXpert MRSA Assay (FDA approved for NASAL specimens only), is one component of a comprehensive MRSA colonization surveillance program. It is not intended to diagnose MRSA infection nor to guide or monitor treatment for MRSA infections. CRITICAL RESULT CALLED TO, READ BACK BY AND VERIFIED WITH: TAYLOR BECK@[email or web address]  ON 05/13/16 BY HKP     RADIOLOGY:  Dg Chest 2 View  Result Date: 05/15/2016 CLINICAL DATA:  Cough. EXAM: CHEST  2 VIEW COMPARISON:  05/13/2016 and prior radiographs FINDINGS: Upper limits normal heart size again noted. Mild right basilar opacity may represent atelectasis or airspace disease. Interstitial/peribronchial prominence again noted. There is no evidence of pulmonary edema, suspicious pulmonary nodule/mass, pleural effusion, or pneumothorax. No acute bony abnormalities are identified. IMPRESSION: Mild right basilar opacity -question atelectasis versus airspace disease/ pneumonia. Electronically Signed   By: Margarette Canada M.D.   On: 05/15/2016 13:50   Nm Myocar Multi W/spect W/wall Motion / Ef  Result Date: 05/15/2016  Blood pressure demonstrated a normal response to exercise.  There was no ST segment deviation noted during stress.  This is a low risk study.  The  left  ventricular ejection fraction is hyperdynamic (>65%).     Follow up with PCP in 1 week.  Management plans discussed with the patient, family and they are in agreement.  CODE STATUS:     Code Status Orders        Start     Ordered   05/13/16 0817  Do not attempt resuscitation (DNR)  Continuous    Question Answer Comment  In the event of cardiac or respiratory ARREST Do not call a "code blue"   In the event of cardiac or respiratory ARREST Do not perform Intubation, CPR, defibrillation or ACLS   In the event of cardiac or respiratory ARREST Use medication by any route, position, wound care, and other measures to relive pain and suffering. May use oxygen, suction and manual treatment of airway obstruction as needed for comfort.      05/13/16 0817    Code Status History    Date Active Date Inactive Code Status Order ID Comments User Context   01/26/2016  1:58 AM 01/27/2016  3:01 PM DNR [id number]  Harvie Bridge, DO Inpatient   01/26/2016  1:27 AM 01/26/2016  1:58 AM Full Code [id number]  Harvie Bridge, DO Inpatient   09/22/2014  4:21 PM 09/24/2014  5:08 PM DNR [id number]  Vaughan Basta, MD Inpatient   09/10/2014  9:37 PM 09/14/2014  7:24 PM DNR [id number]  Gladstone Lighter, MD Inpatient   05/18/2014  7:03 PM 05/24/2014  4:31 PM DNR [id number]  Samella Parr, NP Inpatient   05/05/2014 11:15 PM 05/06/2014  5:26 PM Partial Code [id number]  Mariea Clonts, MD Inpatient    Advance Directive Documentation     Most Recent Value  Type of Advance Directive  Healthcare Power of Attorney, Living will  Pre-existing out of facility DNR order (yellow form or pink MOST form)  -  "MOST" Form in Place?  -      TOTAL TIME TAKING CARE OF THIS PATIENT ON DAY OF DISCHARGE: more than 30 minutes.   Hillary Bow R M.D on 05/15/2016 at 2:27 PM  Between 7am to 6pm - Pager - [phone]  After 6pm go to [email or web address] - password [email or web address] [facility]  Office   [phone]  CC: Primary care physician; Viviana Simpler, MD  Note: This dictation was prepared with Dragon dictation along with smaller phrase technology. Any transcriptional errors that result from this process are unintentional.

## 2016-05-15 NOTE — Plan of Care (Signed)
Problem: Safety: Goal: Ability to remain free from injury will improve Outcome: Progressing Pts exit alarm is activated. Pt is encouraged to call out for assistance with activity.

## 2016-05-15 NOTE — Care Management Important Message (Signed)
Important Message  Patient Details  Name: CHOLE DRIVER MRN: [id number] Date of Birth: 03/01/1930   Medicare Important Message Given:  Yes Signed IM notice given    Katrina Stack, RN 05/15/2016, 9:09 AM

## 2016-05-15 NOTE — Clinical Social Work Note (Signed)
Patient to be d/c'ed today to [facility]  Patient and family agreeable to plans will transport via daughter's car RN to call report to [phone] room 307A.Evette Cristal, MSW, [place]

## 2016-05-15 NOTE — Progress Notes (Signed)
Patient has no acute event overnight. NSR/ST with stable VS. NPO for scheduled myoview in AM

## 2016-05-15 NOTE — NC FL2 (Signed)
[place] LEVEL OF CARE SCREENING TOOL     IDENTIFICATION  Patient Name: Krista Ellis Birthdate: [birth year] Sex: female Admission Date (Current Location): 05/13/2016  [place] and [place] Number:  Krista Ellis [id number] [place] and Address:  [facility], [address] [place], [place] [address]      Provider Number: [id number]  Attending Physician Name and Address:  Hillary Bow, MD  Relative Name and Phone Number:  [facility] Daughter [phone] or Tawni Levy Daughter [phone]     Current Level of Care: Hospital Recommended Level of Care: [place] Prior Approval Number:    Date Approved/Denied:   PASRR Number: [id number]  Discharge Plan: SNF    Current Diagnoses: Patient Active Problem List   Diagnosis Date Noted  . Chest pain 05/13/2016  . Acid reflux 04/13/2015  . Combined fat and carbohydrate induced hyperlipemia 04/13/2015  . Dysuria 03/01/2015  . Atrophic vaginitis 03/01/2015  . Microscopic hematuria 03/01/2015  . Bladder spasms 03/01/2015  . Pulmonary embolism ([place]) 09/22/2014  . UTI (urinary tract infection) 09/14/2014  . Enterococcus UTI 09/14/2014  . Generalized weakness 09/14/2014  . Sepsis ([place]) 09/10/2014  . Open wound of knee, leg (except thigh), and ankle, complicated 01/74/9449  . Buttock pain 07/19/2014  . Hospital discharge follow-up 07/04/2014  . Facial droop   . Malnutrition of moderate degree ([place]) 05/21/2014  . Seizures ([place])   . TIA (transient ischemic attack) 05/18/2014  . Systolic murmur 67/59/1638  . Gait disturbance 05/15/2014  . Benign essential HTN 05/08/2014  . History of subdural hematoma   . Subdural hematoma ([place]) 05/05/2014  . B12 deficiency 03/09/2014  . Bradycardia 02/24/2014  . Arteriosclerosis of coronary artery 02/23/2014  . Recurrent UTI 01/03/2014  . Bilateral hand numbness 01/03/2014  . Nephrolithiasis 11/18/2013  . Recurrent falls 11/18/2013   . Closed rib fracture 09/25/2013  . Abnormal CT scan, chest 09/01/2013  . Chest wall pain 08/31/2013  . Hematoma and contusion 08/31/2013  . Atypical chest pain 06/17/2013  . Peripheral vascular disease ([place]) 06/17/2013  . Heart valve disease 06/17/2013  . Atherosclerotic peripheral vascular disease ([place]) 06/01/2013  . Bladder wall hemorrhage 12/10/2012  . Dysphagia, pharyngoesophageal phase 12/10/2012  . Malaise and fatigue 12/10/2012  . Frank hematuria 12/02/2012  . Bladder retention 12/02/2012  . Postmenopausal estrogen deficiency 11/30/2012  . Glucose found in urine on examination 11/30/2012  . Post menopausal syndrome 11/30/2012  . Type 2 diabetes mellitus with hyperglycemia ([facility]) 11/24/2012  . Lung nodule, solitary 03/12/2012  . Diverticular disease of large intestine 12/25/2011  . 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 Symptoms involving urinary system 12/04/2011  . Medicare annual wellness visit, subsequent 10/01/2011  . Dyslipidemia 10/01/2011  . HLD (hyperlipidemia) 10/01/2011  . Encounter for general adult medical examination without abnormal findings 10/01/2011  . Bladder infection, chronic 09/29/2011  . Incomplete bladder emptying 09/29/2011  . Urge incontinence of urine 09/29/2011  . Delayed onset of urination 09/29/2011  . Hypertension 10/17/2010  . Hypothyroidism 10/17/2010  . Osteoarthritis 10/17/2010  . Essential (primary) hypertension 10/17/2010  . Arthritis, degenerative 10/17/2010  . CN (constipation) 05/02/2010    Orientation RESPIRATION BLADDER Height & Weight     Situation, Place, Self  Normal Incontinent Weight: 133 lb 12.8 oz (60.7 kg) Height:  5\' 5"  (165.1 cm)  BEHAVIORAL SYMPTOMS/MOOD NEUROLOGICAL BOWEL NUTRITION STATUS      Continent Diet  AMBULATORY STATUS COMMUNICATION OF NEEDS Skin   Limited Assist Verbally Normal  Personal Care Assistance Level of Assistance  Bathing, Feeding,  Dressing Bathing Assistance: Limited assistance Feeding assistance: Independent Dressing Assistance: Limited assistance     Functional Limitations Info  Sight, Hearing, Speech Sight Info: Adequate Hearing Info: Adequate Speech Info: Adequate    SPECIAL CARE FACTORS FREQUENCY                       Contractures      Additional Factors Info  Code Status, Allergies Code Status Info: DNR Allergies Info: SULFA ANTIBIOTICS, CIPROFLOXACIN, LEVOFLOXACIN, PENICILLINS           Current Medications (05/15/2016):  This is the current hospital active medication list Current Facility-Administered Medications  Medication Dose Route Frequency Provider Last Rate Last Dose  . acetaminophen (TYLENOL) tablet 650 mg  650 mg Oral Q6H PRN Harrie Foreman, MD   650 mg at 05/14/16 1642   Or  . acetaminophen (TYLENOL) suppository 650 mg  650 mg Rectal Q6H PRN Harrie Foreman, MD      . acidophilus (RISAQUAD) capsule 1 capsule  1 capsule Oral Daily Harrie Foreman, MD   1 capsule at 05/15/16 1043  . amLODipine (NORVASC) tablet 5 mg  5 mg Oral Daily Harrie Foreman, MD   5 mg at 05/15/16 1043  . apixaban (ELIQUIS) tablet 5 mg  5 mg Oral BID Harrie Foreman, MD   5 mg at 05/15/16 1043  . bisacodyl (DULCOLAX) suppository 10 mg  10 mg Rectal Daily PRN Harrie Foreman, MD      . Chlorhexidine Gluconate Cloth 2 % PADS 6 each  6 each Topical Q0600 Hillary Bow, MD   6 each at 05/13/16 1147  . [START ON 05/21/2016] cyanocobalamin ((VITAMIN B-12)) injection 1,000 mcg  1,000 mcg Intramuscular Q30 days Harrie Foreman, MD      . dextromethorphan-guaiFENesin (ROBITUSSIN-DM) 10-100 MG/5ML liquid 10 mL  10 mL Oral Q4H PRN Harrie Foreman, MD      . docusate sodium (COLACE) capsule 100 mg  100 mg Oral BID Harrie Foreman, MD   100 mg at 05/15/16 1043  . hydrOXYzine (ATARAX/VISTARIL) tablet 12.5 mg  12.5 mg Oral TID PRN Harrie Foreman, MD      . levothyroxine (SYNTHROID, LEVOTHROID) tablet 25  mcg  25 mcg Oral Daily Harrie Foreman, MD   25 mcg at 05/15/16 0600  . loratadine (CLARITIN) tablet 10 mg  10 mg Oral Daily Harrie Foreman, MD   10 mg at 05/15/16 1044  . magnesium hydroxide (MILK OF MAGNESIA) suspension 30 mL  30 mL Oral Daily PRN Harrie Foreman, MD      . menthol-cetylpyridinium (CEPACOL) lozenge 3 mg  1 lozenge Oral PRN Harrie Foreman, MD      . metoprolol succinate (TOPROL-XL) 24 hr tablet 12.5 mg  12.5 mg Oral Daily Harrie Foreman, MD   12.5 mg at 05/15/16 1043  . mupirocin ointment (BACTROBAN) 2 % 1 application  1 application Nasal BID Hillary Bow, MD   1 application at 15/40/08 2146  . nitrofurantoin (macrocrystal-monohydrate) (MACROBID) capsule 100 mg  100 mg Oral BID Harrie Foreman, MD   100 mg at 05/15/16 1043  . ondansetron (ZOFRAN) tablet 4 mg  4 mg Oral Q6H PRN Harrie Foreman, MD       Or  . ondansetron [facility]) injection 4 mg  4 mg Intravenous Q6H PRN Harrie Foreman, MD      . pantoprazole (  PROTONIX) EC tablet 40 mg  40 mg Oral BID AC Srikar Sudini, MD   40 mg at 05/14/16 1811  . phenazopyridine (PYRIDIUM) tablet 100 mg  100 mg Oral BID PRN Harrie Foreman, MD      . polyethylene glycol [facility] / GLYCOLAX) packet 17 g  17 g Oral Daily PRN Harrie Foreman, MD      . pravastatin (PRAVACHOL) tablet 40 mg  40 mg Oral QPM Harrie Foreman, MD   40 mg at 05/14/16 1811  . senna (SENOKOT) tablet 17.2 mg  2 tablet Oral Daily Harrie Foreman, MD   17.2 mg at 05/15/16 1043  . sodium chloride (OCEAN) 0.65 % nasal spray 1 spray  1 spray Each Nare Q2H PRN Harrie Foreman, MD      . sodium chloride flush (NS) 0.9 % injection 3 mL  3 mL Intravenous Q12H Harrie Foreman, MD   3 mL at 05/14/16 2138  . traMADol (ULTRAM) tablet 25 mg  25 mg Oral Q8H PRN Harrie Foreman, MD   25 mg at 05/14/16 2130  . vitamin C (ASCORBIC ACID) tablet 1,000 mg  1,000 mg Oral Daily Harrie Foreman, MD   1,000 mg at 05/15/16 1043  . [START ON 06/01/2016] Vitamin D  (Ergocalciferol) (DRISDOL) capsule 50,000 Units  50,000 Units Oral Q30 days Harrie Foreman, MD         Discharge Medications: Please see discharge summary for a list of discharge medications.  Relevant Imaging Results:  Relevant Lab Results:   Additional Information SS# [id number]  Anell Barr

## 2016-05-15 NOTE — Clinical Social Work Note (Signed)
Clinical Social Work Assessment  Patient Details  Name: Krista Ellis MRN: [id number] Date of Birth: 12-28-1930  Date of referral:  05/15/16               Reason for consult:  Facility Placement                Permission sought to share information with:  Facility Sport and exercise psychologist, Family Supports Permission granted to share information::  Yes, Verbal Permission Granted  Name::     [facility] Daughter [phone] or Tawni Levy Daughter [phone]   Agency::  SNF admissions  Relationship::     Contact Information:     Housing/Transportation Living arrangements for the past 2 months:  [place] of Information:  Adult Children Patient Interpreter Needed:  None Criminal Activity/Legal Involvement Pertinent to Current Situation/Hospitalization:  No - Comment as needed Significant Relationships:  Adult Children Lives with:  Facility Resident Do you feel safe going back to the place where you live?  Yes Need for family participation in patient care:  No (Coment)  Care giving concerns:  Patient and family do not have any concerns about returning back to [facility].   Social Worker assessment / plan:  Patient is a 83 year old female who is alert and oriented x4 and able to express her feelings.  Patient has been living at [facility] and plans to return back to SNF.  Patient expressed that she is ready to return back to [facility], patient's family expressed they are ready for her to return as well.  Patient and her family did not express any other questions or concerns, permission was given to send information to [facility].  Employment status:  Retired Forensic scientist:  Medicare PT Recommendations:  Not assessed at this time [place] / Referral to community resources:  [place]  Patient/Family's Response to care:  Patient and family agreeable to returning back to SNF.  Patient/Family's Understanding of and Emotional Response  to Diagnosis, Current Treatment, and Prognosis:  Patient and family are aware of current treatment plan and prognosis.  Emotional Assessment Appearance:  Appears stated age Attitude/Demeanor/Rapport:    Affect (typically observed):  Appropriate, Calm, Pleasant Orientation:  Oriented to Place, Oriented to Self, Oriented to  Time, Oriented to Situation Alcohol / Substance use:  Not Applicable Psych involvement (Current and /or in the community):  No (Comment)  Discharge Needs  Concerns to be addressed:  No discharge needs identified Readmission within the last 30 days:  No Current discharge risk:  None Barriers to Discharge:  No Barriers Identified   Ross Ludwig, LCSWA 05/15/2016, 3:27 PM

## 2016-05-15 NOTE — Progress Notes (Signed)
Pt discharged to [facility]. Report called to facility. Discharge packet given to family member providing transportation. Discharge instructions reviewed with family. No questions verbalized.

## 2016-05-16 ENCOUNTER — Telehealth: Payer: Self-pay | Admitting: *Deleted

## 2016-05-16 DIAGNOSIS — J189 Pneumonia, unspecified organism: Secondary | ICD-10-CM | POA: Diagnosis not present

## 2016-05-16 DIAGNOSIS — E039 Hypothyroidism, unspecified: Secondary | ICD-10-CM | POA: Diagnosis not present

## 2016-05-16 DIAGNOSIS — E785 Hyperlipidemia, unspecified: Secondary | ICD-10-CM | POA: Diagnosis not present

## 2016-05-16 DIAGNOSIS — K219 Gastro-esophageal reflux disease without esophagitis: Secondary | ICD-10-CM | POA: Diagnosis not present

## 2016-05-16 DIAGNOSIS — L309 Dermatitis, unspecified: Secondary | ICD-10-CM | POA: Diagnosis not present

## 2016-05-16 DIAGNOSIS — I10 Essential (primary) hypertension: Secondary | ICD-10-CM | POA: Diagnosis not present

## 2016-05-16 DIAGNOSIS — I6523 Occlusion and stenosis of bilateral carotid arteries: Secondary | ICD-10-CM | POA: Diagnosis not present

## 2016-05-16 DIAGNOSIS — M1990 Unspecified osteoarthritis, unspecified site: Secondary | ICD-10-CM | POA: Diagnosis not present

## 2016-05-16 DIAGNOSIS — G909 Disorder of the autonomic nervous system, unspecified: Secondary | ICD-10-CM | POA: Diagnosis not present

## 2016-05-16 NOTE — Telephone Encounter (Signed)
Transition Care Management Follow-up Telephone Call   Date discharged? 05/15/2016   How have you been since you were released from the hospital? "weak"   Do you understand why you were in the hospital? yes   Do you understand the discharge instructions? yes   Where were you discharged to? [facility]   Items Reviewed:  Medications reviewed: no, pt states "im not sure but [facility] knows what I take and all my allergies"   Functional Questionnaire:   Activities of Daily Living (ADLs):   She states they are independent in the following: all areas   Any transportation issues/concerns?: no, daughters ensure pt attends appts    Any patient concerns? no   Confirmed importance and date/time of follow-up visits scheduled yes  Provider Appointment booked with Dr Silvio Pate at [facility]   Confirmed with patient if condition begins to worsen call PCP or go to the ER.  Patient was given the office number and encouraged to call back with question or concerns.  : yes

## 2016-05-21 DIAGNOSIS — N390 Urinary tract infection, site not specified: Secondary | ICD-10-CM | POA: Diagnosis not present

## 2016-05-21 DIAGNOSIS — R410 Disorientation, unspecified: Secondary | ICD-10-CM | POA: Diagnosis not present

## 2016-05-27 DIAGNOSIS — R5383 Other fatigue: Secondary | ICD-10-CM | POA: Diagnosis not present

## 2016-05-28 DIAGNOSIS — M6281 Muscle weakness (generalized): Secondary | ICD-10-CM | POA: Diagnosis not present

## 2016-05-29 DIAGNOSIS — M6281 Muscle weakness (generalized): Secondary | ICD-10-CM | POA: Diagnosis not present

## 2016-05-30 DIAGNOSIS — M6281 Muscle weakness (generalized): Secondary | ICD-10-CM | POA: Diagnosis not present

## 2016-06-02 DIAGNOSIS — M6281 Muscle weakness (generalized): Secondary | ICD-10-CM | POA: Diagnosis not present

## 2016-06-03 DIAGNOSIS — M6281 Muscle weakness (generalized): Secondary | ICD-10-CM | POA: Diagnosis not present

## 2016-06-04 DIAGNOSIS — M6281 Muscle weakness (generalized): Secondary | ICD-10-CM | POA: Diagnosis not present

## 2016-06-05 DIAGNOSIS — M6281 Muscle weakness (generalized): Secondary | ICD-10-CM | POA: Diagnosis not present

## 2016-06-06 DIAGNOSIS — M6281 Muscle weakness (generalized): Secondary | ICD-10-CM | POA: Diagnosis not present

## 2016-06-08 DIAGNOSIS — M6281 Muscle weakness (generalized): Secondary | ICD-10-CM | POA: Diagnosis not present

## 2016-06-09 DIAGNOSIS — M6281 Muscle weakness (generalized): Secondary | ICD-10-CM | POA: Diagnosis not present

## 2016-06-10 DIAGNOSIS — M6281 Muscle weakness (generalized): Secondary | ICD-10-CM | POA: Diagnosis not present

## 2016-06-13 DIAGNOSIS — M6281 Muscle weakness (generalized): Secondary | ICD-10-CM | POA: Diagnosis not present

## 2016-07-10 DIAGNOSIS — I2699 Other pulmonary embolism without acute cor pulmonale: Secondary | ICD-10-CM | POA: Diagnosis not present

## 2016-07-10 DIAGNOSIS — G9009 Other idiopathic peripheral autonomic neuropathy: Secondary | ICD-10-CM | POA: Diagnosis not present

## 2016-07-10 DIAGNOSIS — G3184 Mild cognitive impairment, so stated: Secondary | ICD-10-CM | POA: Diagnosis not present

## 2016-07-10 DIAGNOSIS — I739 Peripheral vascular disease, unspecified: Secondary | ICD-10-CM | POA: Diagnosis not present

## 2016-07-10 DIAGNOSIS — I6523 Occlusion and stenosis of bilateral carotid arteries: Secondary | ICD-10-CM | POA: Diagnosis not present

## 2016-08-11 DIAGNOSIS — N390 Urinary tract infection, site not specified: Secondary | ICD-10-CM | POA: Diagnosis not present

## 2016-08-14 ENCOUNTER — Encounter: Payer: Self-pay | Admitting: Emergency Medicine

## 2016-08-14 ENCOUNTER — Emergency Department: Payer: Medicare Other

## 2016-08-14 ENCOUNTER — Inpatient Hospital Stay
Admission: EM | Admit: 2016-08-14 | Discharge: 2016-08-16 | DRG: 871 | Disposition: A | Discharge status: Skilled Nursing Facility | Payer: Medicare Other | Attending: Internal Medicine | Admitting: Internal Medicine

## 2016-08-14 DIAGNOSIS — Z96641 Presence of right artificial hip joint: Secondary | ICD-10-CM | POA: Diagnosis present

## 2016-08-14 DIAGNOSIS — Z87442 Personal history of urinary calculi: Secondary | ICD-10-CM

## 2016-08-14 DIAGNOSIS — Z8249 Family history of ischemic heart disease and other diseases of the circulatory system: Secondary | ICD-10-CM | POA: Diagnosis not present

## 2016-08-14 DIAGNOSIS — G9349 Other encephalopathy: Secondary | ICD-10-CM | POA: Diagnosis present

## 2016-08-14 DIAGNOSIS — I252 Old myocardial infarction: Secondary | ICD-10-CM

## 2016-08-14 DIAGNOSIS — I739 Peripheral vascular disease, unspecified: Secondary | ICD-10-CM | POA: Diagnosis present

## 2016-08-14 DIAGNOSIS — J9601 Acute respiratory failure with hypoxia: Secondary | ICD-10-CM | POA: Diagnosis present

## 2016-08-14 DIAGNOSIS — A419 Sepsis, unspecified organism: Secondary | ICD-10-CM | POA: Diagnosis not present

## 2016-08-14 DIAGNOSIS — R05 Cough: Secondary | ICD-10-CM | POA: Diagnosis not present

## 2016-08-14 DIAGNOSIS — M7989 Other specified soft tissue disorders: Secondary | ICD-10-CM | POA: Diagnosis present

## 2016-08-14 DIAGNOSIS — I10 Essential (primary) hypertension: Secondary | ICD-10-CM | POA: Diagnosis present

## 2016-08-14 DIAGNOSIS — J690 Pneumonitis due to inhalation of food and vomit: Secondary | ICD-10-CM | POA: Diagnosis present

## 2016-08-14 DIAGNOSIS — Z8673 Personal history of transient ischemic attack (TIA), and cerebral infarction without residual deficits: Secondary | ICD-10-CM | POA: Diagnosis not present

## 2016-08-14 DIAGNOSIS — E785 Hyperlipidemia, unspecified: Secondary | ICD-10-CM | POA: Diagnosis present

## 2016-08-14 DIAGNOSIS — Z86711 Personal history of pulmonary embolism: Secondary | ICD-10-CM | POA: Diagnosis not present

## 2016-08-14 DIAGNOSIS — Z7901 Long term (current) use of anticoagulants: Secondary | ICD-10-CM

## 2016-08-14 DIAGNOSIS — Z66 Do not resuscitate: Secondary | ICD-10-CM | POA: Diagnosis present

## 2016-08-14 DIAGNOSIS — R079 Chest pain, unspecified: Secondary | ICD-10-CM | POA: Diagnosis not present

## 2016-08-14 DIAGNOSIS — Z881 Allergy status to other antibiotic agents status: Secondary | ICD-10-CM | POA: Diagnosis not present

## 2016-08-14 DIAGNOSIS — R0602 Shortness of breath: Secondary | ICD-10-CM | POA: Diagnosis not present

## 2016-08-14 DIAGNOSIS — J181 Lobar pneumonia, unspecified organism: Secondary | ICD-10-CM

## 2016-08-14 DIAGNOSIS — Z87440 Personal history of urinary (tract) infections: Secondary | ICD-10-CM | POA: Diagnosis not present

## 2016-08-14 DIAGNOSIS — Z882 Allergy status to sulfonamides status: Secondary | ICD-10-CM | POA: Diagnosis not present

## 2016-08-14 DIAGNOSIS — Z96652 Presence of left artificial knee joint: Secondary | ICD-10-CM | POA: Diagnosis present

## 2016-08-14 DIAGNOSIS — N3020 Other chronic cystitis without hematuria: Secondary | ICD-10-CM | POA: Diagnosis present

## 2016-08-14 DIAGNOSIS — Z880 Allergy status to penicillin: Secondary | ICD-10-CM | POA: Diagnosis not present

## 2016-08-14 DIAGNOSIS — I2699 Other pulmonary embolism without acute cor pulmonale: Secondary | ICD-10-CM | POA: Diagnosis not present

## 2016-08-14 DIAGNOSIS — J189 Pneumonia, unspecified organism: Secondary | ICD-10-CM | POA: Diagnosis present

## 2016-08-14 DIAGNOSIS — K219 Gastro-esophageal reflux disease without esophagitis: Secondary | ICD-10-CM | POA: Diagnosis present

## 2016-08-14 LAB — CBC
HCT: 40.4 % (ref 35.0–47.0)
HEMOGLOBIN: 13.5 g/dL (ref 12.0–16.0)
MCH: 31.3 pg (ref 26.0–34.0)
MCHC: 33.3 g/dL (ref 32.0–36.0)
MCV: 94 fL (ref 80.0–100.0)
PLATELETS: 304 10*3/uL (ref 150–440)
RBC: 4.3 MIL/uL (ref 3.80–5.20)
RDW: 13.2 % (ref 11.5–14.5)
WBC: 11.2 10*3/uL — ABNORMAL HIGH (ref 3.6–11.0)

## 2016-08-14 LAB — MRSA PCR SCREENING: MRSA by PCR: POSITIVE — AB

## 2016-08-14 LAB — BASIC METABOLIC PANEL
Anion gap: 8 (ref 5–15)
BUN: 22 mg/dL — ABNORMAL HIGH (ref 6–20)
CALCIUM: 9.6 mg/dL (ref 8.9–10.3)
CHLORIDE: 104 mmol/L (ref 101–111)
CO2: 26 mmol/L (ref 22–32)
CREATININE: 0.79 mg/dL (ref 0.44–1.00)
GFR calc non Af Amer: >60 mL/min (ref >60)
GLUCOSE: 115 mg/dL — AB (ref 65–99)
Potassium: 4.2 mmol/L (ref 3.5–5.1)
Sodium: 138 mmol/L (ref 135–145)

## 2016-08-14 LAB — LACTIC ACID, PLASMA
Lactic Acid, Venous: 2.9 mmol/L (ref 0.5–1.9)
Lactic Acid, Venous: 2.1 mmol/L (ref 0.5–1.9)

## 2016-08-14 LAB — PROTIME-INR
INR: 1.25
PROTHROMBIN TIME: 15.8 s — AB (ref 11.4–15.2)

## 2016-08-14 LAB — TROPONIN I

## 2016-08-14 MED ORDER — APIXABAN 5 MG PO TABS
5.0000 mg | ORAL_TABLET | Freq: Two times a day (BID) | ORAL | Status: DC
  Administered 2016-08-14 – 2016-08-16 (×4): 5 mg via ORAL
  Filled 2016-08-14 (×4): qty 1

## 2016-08-14 MED ORDER — AMLODIPINE BESYLATE 5 MG PO TABS
5.0000 mg | ORAL_TABLET | Freq: Every day | ORAL | Status: DC
  Administered 2016-08-15 – 2016-08-16 (×2): 5 mg via ORAL
  Filled 2016-08-14 (×2): qty 1

## 2016-08-14 MED ORDER — VITAMIN C 500 MG PO TABS
1000.0000 mg | ORAL_TABLET | Freq: Every day | ORAL | Status: DC
Start: 2016-08-15 — End: 2016-08-16
  Administered 2016-08-15 – 2016-08-16 (×2): 1000 mg via ORAL
  Filled 2016-08-14 (×2): qty 2

## 2016-08-14 MED ORDER — SODIUM CHLORIDE 0.9 % IV SOLN
INTRAVENOUS | Status: DC
  Administered 2016-08-14: 21:00:00 via INTRAVENOUS

## 2016-08-14 MED ORDER — SODIUM CHLORIDE 0.9 % IV BOLUS (SEPSIS)
1000.0000 mL | Freq: Once | INTRAVENOUS | Status: AC
  Administered 2016-08-14: 1000 mL via INTRAVENOUS

## 2016-08-14 MED ORDER — MENTHOL 3 MG MT LOZG: 1.0000 | LOZENGE | OROMUCOSAL | Status: DC | PRN

## 2016-08-14 MED ORDER — ALIGN 4 MG PO CAPS: 1.0000 | ORAL_CAPSULE | Freq: Every day | ORAL | Status: DC

## 2016-08-14 MED ORDER — ACETAMINOPHEN 650 MG RE SUPP: 650.0000 mg | Freq: Four times a day (QID) | RECTAL | Status: DC | PRN

## 2016-08-14 MED ORDER — LEVOTHYROXINE SODIUM 25 MCG PO TABS
25.0000 ug | ORAL_TABLET | Freq: Every day | ORAL | Status: DC
  Administered 2016-08-15 – 2016-08-16 (×2): 25 ug via ORAL
  Filled 2016-08-14 (×2): qty 1

## 2016-08-14 MED ORDER — PANTOPRAZOLE SODIUM 40 MG PO TBEC
40.0000 mg | DELAYED_RELEASE_TABLET | Freq: Every day | ORAL | Status: DC
  Administered 2016-08-15 – 2016-08-16 (×2): 40 mg via ORAL
  Filled 2016-08-14 (×2): qty 1

## 2016-08-14 MED ORDER — RISAQUAD PO CAPS
1.0000 | ORAL_CAPSULE | Freq: Every day | ORAL | Status: DC
  Administered 2016-08-15 – 2016-08-16 (×2): 1 via ORAL
  Filled 2016-08-14 (×2): qty 1

## 2016-08-14 MED ORDER — BISACODYL 10 MG RE SUPP: 10.0000 mg | Freq: Every day | RECTAL | Status: DC | PRN

## 2016-08-14 MED ORDER — LEVOFLOXACIN IN D5W 750 MG/150ML IV SOLN
750.0000 mg | Freq: Once | INTRAVENOUS | Status: AC
  Administered 2016-08-14: 750 mg via INTRAVENOUS
  Filled 2016-08-14: qty 150

## 2016-08-14 MED ORDER — SENNA 8.6 MG PO TABS
2.0000 | ORAL_TABLET | Freq: Every morning | ORAL | Status: DC
  Administered 2016-08-15 – 2016-08-16 (×2): 17.2 mg via ORAL
  Filled 2016-08-14 (×2): qty 2

## 2016-08-14 MED ORDER — ONDANSETRON HCL 4 MG/2ML IJ SOLN: 4.0000 mg | Freq: Four times a day (QID) | INTRAMUSCULAR | Status: DC | PRN

## 2016-08-14 MED ORDER — ACETAMINOPHEN 325 MG PO TABS
650.0000 mg | ORAL_TABLET | Freq: Four times a day (QID) | ORAL | Status: DC | PRN
  Administered 2016-08-14: 650 mg via ORAL

## 2016-08-14 MED ORDER — TRAMADOL HCL 50 MG PO TABS
25.0000 mg | ORAL_TABLET | Freq: Three times a day (TID) | ORAL | Status: DC | PRN
  Administered 2016-08-14: 25 mg via ORAL
  Filled 2016-08-14: qty 1

## 2016-08-14 MED ORDER — ONDANSETRON HCL 4 MG PO TABS
4.0000 mg | ORAL_TABLET | Freq: Four times a day (QID) | ORAL | Status: DC | PRN
  Administered 2016-08-14: 4 mg via ORAL
  Filled 2016-08-14: qty 1

## 2016-08-14 MED ORDER — SALINE SPRAY 0.65 % NA SOLN: 1.0000 | NASAL | Status: DC | PRN

## 2016-08-14 MED ORDER — ACETAMINOPHEN 325 MG PO TABS
650.0000 mg | ORAL_TABLET | Freq: Three times a day (TID) | ORAL | Status: DC
  Administered 2016-08-15 – 2016-08-16 (×5): 650 mg via ORAL
  Filled 2016-08-14 (×6): qty 2

## 2016-08-14 MED ORDER — METOPROLOL SUCCINATE ER 25 MG PO TB24
12.5000 mg | ORAL_TABLET | Freq: Every day | ORAL | Status: DC
  Administered 2016-08-15 – 2016-08-16 (×2): 12.5 mg via ORAL
  Filled 2016-08-14 (×2): qty 1

## 2016-08-14 MED ORDER — VITAMIN D3 1.25 MG (50000 UT) PO CAPS
50000.0000 [IU] | ORAL_CAPSULE | ORAL | Status: DC
  Filled 2016-08-14: qty 1

## 2016-08-14 MED ORDER — POLYETHYLENE GLYCOL 3350 17 G PO PACK: 17.0000 g | PACK | Freq: Every day | ORAL | Status: DC | PRN

## 2016-08-14 MED ORDER — PRAVASTATIN SODIUM 20 MG PO TABS
40.0000 mg | ORAL_TABLET | Freq: Every evening | ORAL | Status: DC
  Administered 2016-08-14 – 2016-08-15 (×2): 40 mg via ORAL
  Filled 2016-08-14: qty 2

## 2016-08-14 MED ORDER — DEXTROMETHORPHAN-GUAIFENESIN 10-100 MG/5ML PO LIQD: 10.0000 mL | ORAL | Status: DC | PRN

## 2016-08-14 MED ORDER — LEVOFLOXACIN IN D5W 750 MG/150ML IV SOLN
750.0000 mg | INTRAVENOUS | Status: DC
  Filled 2016-08-14: qty 150

## 2016-08-14 NOTE — H&P (Signed)
Krista Ellis is an 83 y.o. female.   Chief Complaint: Cough HPI: This is a 83 year old female who is her resident of [facility]. She's had several days of cough. She's felt weak today. She was seen by her primary care's PA and started on medication for a suspected UTI  Past Medical History:  Diagnosis Date  . Arthritis   . Bleeding disorder ([place])   . Chronic cystitis   . Collagen vascular disease ([place])   . CVA (cerebral infarction) 2003  . Dysuria   . Gross hematuria   . Hematuria   . Hemihypertrophy   . History of kidney stones   . Hyperlipidemia   . Hypertension   . Murmur, cardiac   . Myocardial infarction ([place])   . Peripheral vascular disease ([place])    s/p stent right leg  . Pulmonary nodule    stable on Chest CT March 2014, Followed [facility]  . Recurrent UTI   . Sensory urge incontinence   . Thyroid disease   . Urinary frequency     Past Surgical History:  Procedure Laterality Date  . ABDOMINAL HYSTERECTOMY  1982  . APPENDECTOMY    . CARDIAC SURGERY     2 failed stents in right leg  . CARPAL TUNNEL RELEASE    . HIP SURGERY  2007   replaced ball in right hip  . left rotator cuff     repair  . ruptured disc    . SPINE SURGERY     steel rod in back  . stent placement leg Right 2015  . TONSILLECTOMY    . TOTAL HIP ARTHROPLASTY     right  . TOTAL KNEE ARTHROPLASTY Left     Family History  Problem Relation Age of Onset  . Hypertension Mother   . Transient ischemic attack Mother   . Kidney disease Neg Hx   . Bladder Cancer Neg Hx   . Prostate cancer Neg Hx    Social History:  reports that she has never smoked. She has never used smokeless tobacco. She reports that she does not drink alcohol or use drugs.  Allergies:  Allergies  Allergen Reactions  . Sulfa Antibiotics Hives and Itching  . Ciprofloxacin Hives and Itching  . Penicillins Hives and Itching    .Has patient had a PCN reaction causing immediate rash, facial/tongue/throat swelling, SOB or  lightheadedness with hypotension: Unknown Has patient had a PCN reaction causing severe rash involving mucus membranes or skin necrosis: Unknown Has patient had a PCN reaction that required hospitalization: Unknown Has patient had a PCN reaction occurring within the last 10 years: Unknown If all of the above answers are "NO", then may proceed with Cephalosporin use.      (Not in a hospital admission)  Results for orders placed or performed during the hospital encounter of 08/14/16 (from the past 48 hour(s))  Basic metabolic panel     Status: Abnormal   Collection Time: 08/14/16  3:22 PM  Result Value Ref Range   Sodium 138 135 - 145 mmol/L   Potassium 4.2 3.5 - 5.1 mmol/L   Chloride 104 101 - 111 mmol/L   CO2 26 22 - 32 mmol/L   Glucose, Bld 115 (H) 65 - 99 mg/dL   BUN 22 (H) 6 - 20 mg/dL   Creatinine, Ser 0.79 0.44 - 1.00 mg/dL   Calcium 9.6 8.9 - 10.3 mg/dL   GFR calc non Af Amer >60 >60 mL/min   GFR calc Af Amer >60 >  60 mL/min    Comment: (NOTE) The eGFR has been calculated using the CKD EPI equation. This calculation has not been validated in all clinical situations. eGFR's persistently <60 mL/min signify possible Chronic Kidney Disease.    Anion gap 8 5 - 15  CBC     Status: Abnormal   Collection Time: 08/14/16  3:22 PM  Result Value Ref Range   WBC 11.2 (H) 3.6 - 11.0 K/uL   RBC 4.30 3.80 - 5.20 MIL/uL   Hemoglobin 13.5 12.0 - 16.0 g/dL   HCT 40.4 35.0 - 47.0 %   MCV 94.0 80.0 - 100.0 fL   MCH 31.3 26.0 - 34.0 pg   MCHC 33.3 32.0 - 36.0 g/dL   RDW 13.2 11.5 - 14.5 %   Platelets 304 150 - 440 K/uL  Troponin I     Status: None   Collection Time: 08/14/16  3:22 PM  Result Value Ref Range   Troponin I <0.03 <0.03 ng/mL  Protime-INR     Status: Abnormal   Collection Time: 08/14/16  3:22 PM  Result Value Ref Range   Prothrombin Time 15.8 (H) 11.4 - 15.2 seconds   INR 1.25   Lactic acid, plasma     Status: Abnormal   Collection Time: 08/14/16  4:03 PM  Result  Value Ref Range   Lactic Acid, Venous 2.9 (HH) 0.5 - 1.9 mmol/L    Comment: CRITICAL RESULT CALLED TO, READ BACK BY AND VERIFIED WITH EMMA HUNTER ON 08/14/16 AT 1651 [facility]    Dg Chest 2 View  Result Date: 08/14/2016 CLINICAL DATA:  Chest pain and shortness of breath associated with hypoxia. Chronic nonproductive cough. Nonsmoker. History of previous MI and pulmonary embolism EXAM: CHEST  2 VIEW COMPARISON:  PA and lateral chest x-ray of May 15, 2016 FINDINGS: The lungs are adequately inflated. There is confluent alveolar opacity in the right upper lobe. The interstitial markings elsewhere are coarse but not greatly changed from the previous study. There is no pleural effusion. The heart is top-normal in size. The pulmonary vascularity is not engorged. There are severe degenerative changes of both shoulders greatest on the left. The thoracic vertebral bodies are preserved in height where visualized. IMPRESSION: Probable acute pneumonia in the right upper lobe. Underlying chronic interstitial changes. Followup PA and lateral chest X-ray is recommended in 3-4 weeks following trial of antibiotic therapy to ensure resolution and exclude underlying malignancy. Electronically Signed   By: David  Martinique M.D.   On: 08/14/2016 16:39    Review of Systems  Constitutional: Negative for chills and fever.  HENT: Negative for hearing loss.   Eyes: Negative for blurred vision.  Respiratory: Positive for cough.   Cardiovascular: Negative for chest pain.  Gastrointestinal: Negative for nausea and vomiting.  Genitourinary: Negative for dysuria.  Musculoskeletal: Negative for joint pain.  Skin: Negative for rash.  Neurological: Negative for dizziness.    Blood pressure (!) 136/51, pulse 99, resp. rate (!) 23, height 5' 3"  (1.6 m), weight 60.3 kg (133 lb), SpO2 93 %. Physical Exam  Constitutional: She is oriented to person, place, and time. She appears well-developed and well-nourished. No distress.  HENT:   Head: Normocephalic and atraumatic.  Mouth/Throat: Oropharynx is clear and moist. No oropharyngeal exudate.  Eyes: Pupils are equal, round, and reactive to light. No scleral icterus.  Neck: Neck supple. No JVD present. No tracheal deviation present. No thyromegaly present.  Cardiovascular: Normal rate and regular rhythm.   No murmur heard. Respiratory: Effort normal  and breath sounds normal. No respiratory distress.  Mild scattered rhonchi. No dullness to percussion  GI: Soft. Bowel sounds are normal. She exhibits no distension and no mass. There is no tenderness. There is no rebound.  Musculoskeletal: Normal range of motion. She exhibits no edema.  Lymphadenopathy:    She has no cervical adenopathy.  Neurological: She is alert and oriented to person, place, and time.  Skin: Skin is warm and dry.     Assessment/Plan 1. Sepsis. Likely pulmonary source. We'll treat underlying calls and give supportive care. 2. Pneumonia. She's been started on IV Levaquin. Will continue this. Cultures have been drawn. She is borderline hypoxic with O2 sat around 90. Going give her supplemental oxygen and wean as tolerated. 3. History of PE. This was diagnosed back in January. She is on Eliquis. We'll continue this. 4. Hypertension. Continue current medications next line 5. CODE STATUS. She wishes to be a DO NOT RESUSCITATE and has an out of facility DO NOT RESUSCITATE form on the chart. Next line Total time spent 45 minutes  Baxter Hire, MD 08/14/2016, 5:45 PM

## 2016-08-14 NOTE — Progress Notes (Signed)
Pharmacy Antibiotic Note  Krista Ellis is a 83 y.o. female admitted on 08/14/2016 with pneumonia.  Pharmacy has been consulted for levofloxacin dosing.  Patient has reported ciprofloxacin allergy - confirmed levofloxacin dosing with MD. Patient received a dose this evening - no signs/symptoms of a reaction per RN.  Plan: Levofloxacin 750 mg IV q48h  Height: 5\' 3"  (160 cm) Weight: 133 lb (60.3 kg) IBW/kg (Calculated) : 52.4  Temp (24hrs), Avg:99.4 F (37.4 C), Min:99.4 F (37.4 C), Max:99.4 F (37.4 C)   Recent Labs Lab 08/14/16 1522 08/14/16 1603 08/14/16 1909  WBC 11.2*  --   --   CREATININE 0.79  --   --   LATICACIDVEN  --  2.9* 2.1*    Estimated Creatinine Clearance: 43.3 mL/min (by C-G formula based on SCr of 0.79 mg/dL).    Allergies  Allergen Reactions  . Sulfa Antibiotics Hives and Itching  . Ciprofloxacin Hives and Itching  . Penicillins Hives and Itching    .Has patient had a PCN reaction causing immediate rash, facial/tongue/throat swelling, SOB or lightheadedness with hypotension: Unknown Has patient had a PCN reaction causing severe rash involving mucus membranes or skin necrosis: Unknown Has patient had a PCN reaction that required hospitalization: Unknown Has patient had a PCN reaction occurring within the last 10 years: Unknown If all of the above answers are "NO", then may proceed with Cephalosporin use.     Antimicrobials this admission: levofloxacin 7/26 >>   Dose adjustments this admission:  Microbiology results: 7/26 BCx: Sent 7/26 MRSA PCR: Sent  Thank you for allowing pharmacy to be a part of this patient's care.  Lenis Noon, PharmD Clinical Pharmacist 08/14/2016 8:13 PM

## 2016-08-14 NOTE — ED Triage Notes (Signed)
Patient presents to ED via [facility] from [facility] c/o CP and SOB. Hx of PE. RA SpO2 80% on RA per EMS. Here RA SpO2 94%.

## 2016-08-14 NOTE — ED Provider Notes (Signed)
[facility] Emergency Department Provider Note   ____________________________________________   First MD Initiated Contact with Patient 08/14/16 [phone]     (approximate)  I have reviewed the triage vital signs and the nursing notes.   HISTORY  Chief Complaint Shortness of Breath    HPI Krista Ellis is a 83 y.o. female history of multiple medical conditions including previous pneumonia, pulmonary wasn't  Patient complained of feeling short of breath today prompting evaluation and noted to have lower oxygen saturations placed on nasal cannula at her care home  Currently reports no pain. Does report mild feeling of shortness of breath. No nausea or vomiting. No fevers or chills. Patient reports she was suspicious she may have "pneumonia" does report dry cough last few days  Leg swelling left lower leg. Chronic. Is a history of home embolism in the past, but is now on daily oral anticoagulant  Past Medical History:  Diagnosis Date  . Arthritis   . Bleeding disorder ([place])   . Chronic cystitis   . Collagen vascular disease ([place])   . CVA (cerebral infarction) 2003  . Dysuria   . Gross hematuria   . Hematuria   . Hemihypertrophy   . History of kidney stones   . Hyperlipidemia   . Hypertension   . Murmur, cardiac   . Myocardial infarction ([place])   . Peripheral vascular disease ([place])    s/p stent right leg  . Pulmonary nodule    stable on Chest CT March 2014, Followed at [facility]  . Recurrent UTI   . Sensory urge incontinence   . Thyroid disease   . Urinary frequency     Patient Active Problem List   Diagnosis Date Noted  . Aspiration pneumonia of both lower lobes due to gastric secretions ([place]) 05/15/2016  . Chest pain 05/13/2016  . Acid reflux 04/13/2015  . Combined fat and carbohydrate induced hyperlipemia 04/13/2015  . Dysuria 03/01/2015  . Atrophic vaginitis 03/01/2015  . Microscopic hematuria 03/01/2015  . Bladder spasms 03/01/2015    . Pulmonary embolism ([place]) 09/22/2014  . UTI (urinary tract infection) 09/14/2014  . Enterococcus UTI 09/14/2014  . Generalized weakness 09/14/2014  . Sepsis ([place]) 09/10/2014  . Open wound of knee, leg (except thigh), and ankle, complicated 50/35/4656  . Buttock pain 07/19/2014  . Hospital discharge follow-up 07/04/2014  . Facial droop   . Malnutrition of moderate degree ([place]) 05/21/2014  . Seizures ([place])   . TIA (transient ischemic attack) 05/18/2014  . Systolic murmur 81/27/5170  . Gait disturbance 05/15/2014  . Benign essential HTN 05/08/2014  . History of subdural hematoma   . Subdural hematoma ([place]) 05/05/2014  . B12 deficiency 03/09/2014  . Bradycardia 02/24/2014  . Arteriosclerosis of coronary artery 02/23/2014  . Recurrent UTI 01/03/2014  . Bilateral hand numbness 01/03/2014  . Nephrolithiasis 11/18/2013  . Recurrent falls 11/18/2013  . Closed rib fracture 09/25/2013  . Abnormal CT scan, chest 09/01/2013  . Chest wall pain 08/31/2013  . Hematoma and contusion 08/31/2013  . Atypical chest pain 06/17/2013  . Peripheral vascular disease ([place]) 06/17/2013  . Heart valve disease 06/17/2013  . Atherosclerotic peripheral vascular disease ([place]) 06/01/2013  . Bladder wall hemorrhage 12/10/2012  . Dysphagia, pharyngoesophageal phase 12/10/2012  . Malaise and fatigue 12/10/2012  . Frank hematuria 12/02/2012  . Bladder retention 12/02/2012  . Postmenopausal estrogen deficiency 11/30/2012  . Glucose found in urine on examination 11/30/2012  . Post menopausal syndrome 11/30/2012  . Type 2 diabetes mellitus with  hyperglycemia ([place]) 11/24/2012  . Lung nodule, solitary 03/12/2012  . Diverticular disease of large intestine 12/25/2011  . 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 Symptoms involving urinary system 12/04/2011  . Medicare annual wellness visit, subsequent 10/01/2011  . Dyslipidemia 10/01/2011  . HLD (hyperlipidemia) 10/01/2011  . Encounter for  general adult medical examination without abnormal findings 10/01/2011  . Bladder infection, chronic 09/29/2011  . Incomplete bladder emptying 09/29/2011  . Urge incontinence of urine 09/29/2011  . Delayed onset of urination 09/29/2011  . Hypertension 10/17/2010  . Hypothyroidism 10/17/2010  . Osteoarthritis 10/17/2010  . Essential (primary) hypertension 10/17/2010  . Arthritis, degenerative 10/17/2010  . CN (constipation) 05/02/2010    Past Surgical History:  Procedure Laterality Date  . ABDOMINAL HYSTERECTOMY  1982  . APPENDECTOMY    . CARDIAC SURGERY     2 failed stents in right leg  . CARPAL TUNNEL RELEASE    . HIP SURGERY  2007   replaced ball in right hip  . left rotator cuff     repair  . ruptured disc    . SPINE SURGERY     steel rod in back  . stent placement leg Right 2015  . TONSILLECTOMY    . TOTAL HIP ARTHROPLASTY     right  . TOTAL KNEE ARTHROPLASTY Left     Prior to Admission medications   Medication Sig Start Date End Date Taking? Authorizing Provider  acetaminophen (TYLENOL) 325 MG tablet Take 325 mg by mouth every 4 (four) hours as needed for mild pain, moderate pain, fever or headache. Reported on 02/27/2015   Yes [provider]  acetaminophen (TYLENOL) 650 MG CR tablet Take 650 mg by mouth every 8 (eight) hours.   Yes [provider]  amLODipine (NORVASC) 5 MG tablet Take 5 mg by mouth daily.   Yes [provider]  apixaban (ELIQUIS) 5 MG TABS tablet Two tablets twice a day for six days then one tablet twice a day afterwards Patient taking differently: Take 5 mg by mouth 2 (two) times daily.  01/27/16  Yes Wieting, Richard, MD  Ascorbic Acid (VITAMIN C) 1000 MG tablet Take 1 tablet (1,000 mg total) by mouth daily. 02/13/16  Yes McGowan, Larene Beach A, PA-C  bisacodyl (DULCOLAX) 10 MG suppository Place 10 mg rectally daily as needed for moderate constipation.   Yes [provider]  cetirizine (ZYRTEC) 10 MG tablet Take 10 mg by  mouth daily.   Yes [provider]  Cholecalciferol (VITAMIN D3) 50000 units CAPS Take 50,000 Units by mouth every 30 (thirty) days. Patient takes on the 13th   Yes [provider]  Cranberry 425 MG CAPS Take 425 mg by mouth daily.   Yes [provider]  cyanocobalamin (,VITAMIN B-12,) 1000 MCG/ML injection Inject 1,000 mcg into the muscle every 30 (thirty) days. On the 2nd of each month   Yes [provider]  dextromethorphan-guaiFENesin (ROBITUSSIN-DM) 10-100 MG/5ML liquid Take 10 mLs by mouth every 4 (four) hours as needed for cough.   Yes [provider]  hydrOXYzine (ATARAX/VISTARIL) 25 MG tablet Take 12.5 mg by mouth 3 (three) times daily as needed for itching.   Yes [provider]  levothyroxine (SYNTHROID, LEVOTHROID) 25 MCG tablet TAKE 1 TABLET (25 MCG TOTAL) BY MOUTH DAILY. Patient taking differently: Take 25 mcg by mouth daily.  07/04/14  Yes Jackolyn Confer, MD  magnesium hydroxide (MILK OF MAGNESIA) 400 MG/5ML suspension Take 30 mLs by mouth daily as needed  for mild constipation or moderate constipation.   Yes [provider]  menthol-cetylpyridinium (CEPACOL) 3 MG lozenge Take 1 lozenge by mouth as needed for sore throat.   Yes [provider]  metoprolol succinate (TOPROL-XL) 25 MG 24 hr tablet Take 12.5 mg by mouth daily.    Yes [provider]  omeprazole (PRILOSEC) 20 MG capsule Take 1 capsule (20 mg total) by mouth 2 (two) times daily before a meal. 05/15/16  Yes Sudini, Srikar, MD  ondansetron (ZOFRAN) 4 MG tablet Take 4 mg by mouth every 8 (eight) hours as needed for nausea or vomiting.   Yes [provider]  phenazopyridine (PYRIDIUM) 100 MG tablet Take 100 mg by mouth 2 (two) times daily as needed for pain.   Yes [provider]  polyethylene glycol (MIRALAX / GLYCOLAX) packet Take 17 g by mouth daily as needed for mild constipation, moderate constipation or severe constipation.  Reported on 02/27/2015   Yes [provider]  pravastatin (PRAVACHOL) 40 MG tablet Take 1 tablet (40 mg total) by mouth every evening. 07/04/14  Yes Jackolyn Confer, MD  Probiotic Product (ALIGN) 4 MG CAPS Take 1 capsule by mouth daily.   Yes [provider]  senna (SENOKOT) 8.6 MG TABS tablet Take 2 tablets by mouth daily.   Yes [provider]  sodium chloride (OCEAN) 0.65 % SOLN nasal spray Place 1 spray into both nostrils every 2 (two) hours as needed for congestion.   Yes [provider]  traMADol (ULTRAM) 50 MG tablet Take 0.5 tablets (25 mg total) by mouth every 8 (eight) hours as needed for moderate pain. 01/27/16  Yes Wieting, Richard, MD    Allergies Sulfa antibiotics; Ciprofloxacin; and Penicillins  Family History  Problem Relation Age of Onset  . Hypertension Mother   . Transient ischemic attack Mother   . Kidney disease Neg Hx   . Bladder Cancer Neg Hx   . Prostate cancer Neg Hx     Social History Social History  Substance Use Topics  . Smoking status: Never Smoker  . Smokeless tobacco: Never Used  . Alcohol use No    Review of Systems Constitutional: No fever/chills Eyes: No visual changes. ENT: No sore throat. Cardiovascular: Denies chest pain. Respiratory: See history of present illness Gastrointestinal: No abdominal pain.  No nausea, no vomiting.  No diarrhea.  No constipation. Genitourinary: Negative for dysuria. Musculoskeletal: Negative for back pain. Skin: Negative for rash. Neurological: Negative for headaches, focal weakness or numbness.    ____________________________________________   PHYSICAL EXAM:  VITAL SIGNS: ED Triage Vitals  Enc Vitals Group     BP 08/14/16 1606 (!) 158/65     Pulse Rate 08/14/16 1606 (!) 110     Resp 08/14/16 1606 (!) 35     Temp --      Temp src --      SpO2 08/14/16 1606 95 %     Weight 08/14/16 1521 133 lb (60.3 kg)     Height 08/14/16 1521 5\' 3"  (1.6 m)     Head  Circumference --      Peak Flow --      Pain Score 08/14/16 1520 6     Pain Loc --      Pain Edu? --      Excl. in [place]? --     Constitutional: Alert and oriented. Mildly ill-appearing, tachypnea Eyes: Conjunctivae are normal. Head: Atraumatic. Nose: No congestion/rhinnorhea. Mouth/Throat: Mucous membranes are moist. Neck: No stridor.  Cardiovascular: Tachycardic rate, regular rhythm. Grossly normal heart sounds.  Good peripheral circulation. Respiratory: Mild to moderate tachypnea. Heart increased rate. No use of accessory muscles. Does sat about 90% on room air, placed on nasal cannula with improvement of saturations in the 90s. No focal abnormalities noted on lung sounds. No crackles. No wheezing. Gastrointestinal: Soft and nontender. No distention. Musculoskeletal: No lower extremity tenderness nor edema. Neurologic:  Normal speech and language. No gross focal neurologic deficits are appreciated.  Skin:  Skin is warm, dry and intact. No rash noted. Psychiatric: Mood and affect are normal. Speech and behavior are normal.  ____________________________________________   LABS (all labs ordered are listed, but only abnormal results are displayed)  Labs Reviewed  BASIC METABOLIC PANEL - Abnormal; Notable for the following:       Result Value   Glucose, Bld 115 (*)    BUN 22 (*)    All other components within normal limits  CBC - Abnormal; Notable for the following:    WBC 11.2 (*)    All other components within normal limits  LACTIC ACID, PLASMA - Abnormal; Notable for the following:    Lactic Acid, Venous 2.9 (*)    All other components within normal limits  PROTIME-INR - Abnormal; Notable for the following:    Prothrombin Time 15.8 (*)    All other components within normal limits  CULTURE, BLOOD (ROUTINE X 2)  CULTURE, BLOOD (ROUTINE X 2)  TROPONIN I  LACTIC ACID, PLASMA   ____________________________________________  EKG  EKG reviewed and interpreted by 1530 Heart  rate 1:15 QRS 110 QTc 4:30 Sinus tachycardia, significant respiratory variation, nonspecific T-wave abnormality ____________________________________________  RADIOLOGY  Dg Chest 2 View  Result Date: 08/14/2016 CLINICAL DATA:  Chest pain and shortness of breath associated with hypoxia. Chronic nonproductive cough. Nonsmoker. History of previous MI and pulmonary embolism EXAM: CHEST  2 VIEW COMPARISON:  PA and lateral chest x-ray of May 15, 2016 FINDINGS: The lungs are adequately inflated. There is confluent alveolar opacity in the right upper lobe. The interstitial markings elsewhere are coarse but not greatly changed from the previous study. There is no pleural effusion. The heart is top-normal in size. The pulmonary vascularity is not engorged. There are severe degenerative changes of both shoulders greatest on the left. The thoracic vertebral bodies are preserved in height where visualized. IMPRESSION: Probable acute pneumonia in the right upper lobe. Underlying chronic interstitial changes. Followup PA and lateral chest X-ray is recommended in 3-4 weeks following trial of antibiotic therapy to ensure resolution and exclude underlying malignancy. Electronically Signed   By: David  Martinique M.D.   On: 08/14/2016 16:39    ____________________________________________   PROCEDURES  Procedure(s) performed: None  Procedures  Critical Care performed: No  ____________________________________________   INITIAL IMPRESSION / ASSESSMENT AND PLAN / ED COURSE  Pertinent labs & imaging results that were available during my care of the patient were reviewed by me and considered in my medical decision making (see chart for details).  Evaluation reveals right upper lobe infiltrate, likely pneumonia. Possible aspiration. Start patient on IV Levaquin, admitted to hospital as she does show evidence of early signs of sepsis with slightly elevated lactate for which we are getting fluid bolus. Starting  antibiotics.  No evidence of acute cardiac abnormality noted. There is a history of pulmonary embolism but is on oral anticoagulant lowering the likelihood of this etiology      ____________________________________________   FINAL CLINICAL IMPRESSION(S) / ED DIAGNOSES  Final diagnoses:  Chest pain  Community acquired pneumonia of right upper lobe of lung ([place])  Sepsis, due to unspecified organism [facility])      NEW MEDICATIONS STARTED DURING THIS VISIT:  New Prescriptions   No medications on file     Note:  This document was prepared using Dragon voice recognition software and may include unintentional dictation errors.     Delman Kitten, MD 08/14/16 [phone]

## 2016-08-15 LAB — BASIC METABOLIC PANEL
Anion gap: 7 (ref 5–15)
BUN: 19 mg/dL (ref 6–20)
CHLORIDE: 107 mmol/L (ref 101–111)
CO2: 24 mmol/L (ref 22–32)
CREATININE: 0.76 mg/dL (ref 0.44–1.00)
Calcium: 8.7 mg/dL — ABNORMAL LOW (ref 8.9–10.3)
GFR calc non Af Amer: >60 mL/min (ref >60)
Glucose, Bld: 102 mg/dL — ABNORMAL HIGH (ref 65–99)
Potassium: 4.8 mmol/L (ref 3.5–5.1)
Sodium: 138 mmol/L (ref 135–145)

## 2016-08-15 LAB — CBC
HCT: 33.4 % — ABNORMAL LOW (ref 35.0–47.0)
HEMOGLOBIN: 11.2 g/dL — AB (ref 12.0–16.0)
MCH: 31.4 pg (ref 26.0–34.0)
MCHC: 33.5 g/dL (ref 32.0–36.0)
MCV: 93.7 fL (ref 80.0–100.0)
PLATELETS: 270 10*3/uL (ref 150–440)
RBC: 3.57 MIL/uL — AB (ref 3.80–5.20)
RDW: 13.2 % (ref 11.5–14.5)
WBC: 16.6 10*3/uL — ABNORMAL HIGH (ref 3.6–11.0)

## 2016-08-15 MED ORDER — CHLORHEXIDINE GLUCONATE CLOTH 2 % EX PADS
6.0000 | MEDICATED_PAD | Freq: Every day | CUTANEOUS | Status: DC
  Administered 2016-08-16: 6 via TOPICAL

## 2016-08-15 MED ORDER — MUPIROCIN 2 % EX OINT
1.0000 "application " | TOPICAL_OINTMENT | Freq: Two times a day (BID) | CUTANEOUS | Status: DC
  Administered 2016-08-15 – 2016-08-16 (×2): 1 via NASAL
  Filled 2016-08-15: qty 22

## 2016-08-15 MED ORDER — ENSURE ENLIVE PO LIQD
237.0000 mL | Freq: Two times a day (BID) | ORAL | Status: DC
  Administered 2016-08-15 – 2016-08-16 (×3): 237 mL via ORAL

## 2016-08-15 NOTE — Progress Notes (Signed)
[place] at [place] NAME: Krista Ellis    MR#:  [id number]  DATE OF BIRTH:  03/30/1930  SUBJECTIVE:  CHIEF COMPLAINT:   Chief Complaint  Patient presents with  . Shortness of Breath   SOB better. More coherent per family. Has cough  REVIEW OF SYSTEMS:    Review of Systems  Constitutional: Positive for malaise/fatigue. Negative for chills and fever.  HENT: Negative for sore throat.   Eyes: Negative for blurred vision, double vision and pain.  Respiratory: Positive for cough and shortness of breath. Negative for hemoptysis and wheezing.   Cardiovascular: Negative for chest pain, palpitations, orthopnea and leg swelling.  Gastrointestinal: Negative for abdominal pain, constipation, diarrhea, heartburn, nausea and vomiting.  Genitourinary: Negative for dysuria and hematuria.  Musculoskeletal: Negative for back pain and joint pain.  Skin: Negative for rash.  Neurological: Positive for weakness. Negative for sensory change, speech change, focal weakness and headaches.  Endo/Heme/Allergies: Does not bruise/bleed easily.  Psychiatric/Behavioral: Negative for depression. The patient is not nervous/anxious.     DRUG ALLERGIES:   Allergies  Allergen Reactions  . Sulfa Antibiotics Hives and Itching  . Ciprofloxacin Hives and Itching  . Penicillins Hives and Itching    .Has patient had a PCN reaction causing immediate rash, facial/tongue/throat swelling, SOB or lightheadedness with hypotension: Unknown Has patient had a PCN reaction causing severe rash involving mucus membranes or skin necrosis: Unknown Has patient had a PCN reaction that required hospitalization: Unknown Has patient had a PCN reaction occurring within the last 10 years: Unknown If all of the above answers are "NO", then may proceed with Cephalosporin use.     VITALS:  Blood pressure (!) 119/43, pulse 85, temperature 98.4 F (36.9 C), temperature source Oral, resp.  rate 20, height 5\' 3"  (1.6 m), weight 60.3 kg (133 lb), SpO2 94 %.  PHYSICAL EXAMINATION:   Physical Exam  GENERAL:  83 y.o.-year-old patient lying in the bed with no acute distress.  EYES: Pupils equal, round, reactive to light and accommodation. No scleral icterus. Extraocular muscles intact.  HEENT: Head atraumatic, normocephalic. Oropharynx and nasopharynx clear.  NECK:  Supple, no jugular venous distention. No thyroid enlargement, no tenderness.  LUNGS: Normal breath sounds bilaterally, no wheezing, rales, rhonchi. No use of accessory muscles of respiration.  CARDIOVASCULAR: S1, S2 normal. No murmurs, rubs, or gallops.  ABDOMEN: Soft, nontender, nondistended. Bowel sounds present. No organomegaly or mass.  EXTREMITIES: No cyanosis, clubbing or edema b/l.    NEUROLOGIC: Cranial nerves II through XII are intact. No focal Motor or sensory deficits b/l.   PSYCHIATRIC: The patient is alert and oriented x 3.  SKIN: No obvious rash, lesion, or ulcer.   LABORATORY PANEL:   CBC  Recent Labs Lab 08/15/16 0515  WBC 16.6*  HGB 11.2*  HCT 33.4*  PLT 270   ------------------------------------------------------------------------------------------------------------------ Chemistries   Recent Labs Lab 08/15/16 0515  NA 138  K 4.8  CL 107  CO2 24  GLUCOSE 102*  BUN 19  CREATININE 0.76  CALCIUM 8.7*   ------------------------------------------------------------------------------------------------------------------  Cardiac Enzymes  Recent Labs Lab 08/14/16 1522  TROPONINI <0.03   ------------------------------------------------------------------------------------------------------------------  RADIOLOGY:  Dg Chest 2 View  Result Date: 08/14/2016 CLINICAL DATA:  Chest pain and shortness of breath associated with hypoxia. Chronic nonproductive cough. Nonsmoker. History of previous MI and pulmonary embolism EXAM: CHEST  2 VIEW COMPARISON:  PA and lateral chest x-ray of May 15, 2016 FINDINGS: The lungs are adequately inflated.  There is confluent alveolar opacity in the right upper lobe. The interstitial markings elsewhere are coarse but not greatly changed from the previous study. There is no pleural effusion. The heart is top-normal in size. The pulmonary vascularity is not engorged. There are severe degenerative changes of both shoulders greatest on the left. The thoracic vertebral bodies are preserved in height where visualized. IMPRESSION: Probable acute pneumonia in the right upper lobe. Underlying chronic interstitial changes. Followup PA and lateral chest X-ray is recommended in 3-4 weeks following trial of antibiotic therapy to ensure resolution and exclude underlying malignancy. Electronically Signed   By: David  Martinique M.D.   On: 08/14/2016 16:39     ASSESSMENT AND PLAN:   * Right upper lobe pneumonia with sepsis and acute hypoxic respiratory failure IV Levaquin. Nebulizers when necessary. Wean oxygen as tolerated. Cultures have been drawn and pending.  * Acute encephalopathy due to pneumonia present on admission has resolved  * GERD. Continue PPIs.  * Chronic cystitis  * History of pulmonary embolism. Patient is on eliquis.  All the records are reviewed and case discussed with Care Management/Social Workerr. Management plans discussed with the patient, family and they are in agreement.  CODE STATUS: DNR  DVT Prophylaxis: SCDs  TOTAL TIME TAKING CARE OF THIS PATIENT: 35 minutes.   POSSIBLE D/C IN 1-2 DAYS, DEPENDING ON CLINICAL CONDITION.  Hillary Bow R M.D on 08/15/2016 at 1:28 PM  Between 7am to 6pm - Pager - [phone]  After 6pm go to [email or web address] - password [email or web address] [facility]  Office  [phone]  CC: Primary care physician; Venia Carbon, MD  Note: This dictation was prepared with Dragon dictation along with smaller phrase technology. Any transcriptional errors that result from this process are  unintentional.

## 2016-08-15 NOTE — Progress Notes (Signed)
Pharmacy Antibiotic Note  Krista Ellis is a 83 y.o. female admitted on 08/14/2016 with pneumonia.  Pharmacy has been consulted for levofloxacin dosing.  Patient has reported ciprofloxacin allergy - confirmed levofloxacin dosing with MD. Patient received a dose this evening - no signs/symptoms of a reaction per RN.  Plan: Will continue levofloxacin 750 mg IV q48h.  Height: 5\' 3"  (160 cm) Weight: 133 lb (60.3 kg) IBW/kg (Calculated) : 52.4  Temp (24hrs), Avg:98.9 F (37.2 C), Min:98.4 F (36.9 C), Max:99.4 F (37.4 C)   Recent Labs Lab 08/14/16 1522 08/14/16 1603 08/14/16 1909 08/15/16 0515  WBC 11.2*  --   --  16.6*  CREATININE 0.79  --   --  0.76  LATICACIDVEN  --  2.9* 2.1*  --     Estimated Creatinine Clearance: 43.3 mL/min (by C-G formula based on SCr of 0.76 mg/dL).    Allergies  Allergen Reactions  . Sulfa Antibiotics Hives and Itching  . Ciprofloxacin Hives and Itching  . Penicillins Hives and Itching    .Has patient had a PCN reaction causing immediate rash, facial/tongue/throat swelling, SOB or lightheadedness with hypotension: Unknown Has patient had a PCN reaction causing severe rash involving mucus membranes or skin necrosis: Unknown Has patient had a PCN reaction that required hospitalization: Unknown Has patient had a PCN reaction occurring within the last 10 years: Unknown If all of the above answers are "NO", then may proceed with Cephalosporin use.     Antimicrobials this admission: levofloxacin 7/26 >>   Dose adjustments this admission:  Microbiology results: 7/26 BCx: Sent 7/26 MRSA PCR: positive  Thank you for allowing pharmacy to be a part of this patient's care.  Napoleon Form, PharmD Clinical Pharmacist 08/15/2016 7:36 AM

## 2016-08-15 NOTE — Clinical Social Work Note (Signed)
Clinical Social Work Assessment  Patient Details  Name: Krista Ellis MRN: [id number] Date of Birth: 01/15/1931  Date of referral:  08/15/16               Reason for consult:  Facility Placement                Permission sought to share information with:  Facility Sport and exercise psychologist, Family Supports Permission granted to share information::  Yes, Verbal Permission Granted  Name::        Agency::     Relationship::     Contact Information:     Housing/Transportation Living arrangements for the past 2 months:  [place] of Information:  Patient Patient Interpreter Needed:  None Criminal Activity/Legal Involvement Pertinent to Current Situation/Hospitalization:  No - Comment as needed Significant Relationships:  Adult Children Lives with:  Facility Resident Do you feel safe going back to the place where you live?  Yes Need for family participation in patient care:  Yes (Comment)  Care giving concerns:  Patient is a long term resident at [facility].    Social Worker assessment / plan:  CSW spoke with patient this afternoon due to her being LTC at [facility]. CSW left a message for Seth Bake at [facility] but currently there is no concern that [facility] would not take her back as she is LTC there. Patient was very pleasant and pleased to have company. CSW explained role and purpose of visit. Patient informed CSW that she enjoys living at [facility] and that she wishes to return there when time for discharge. Patient informed CSW that her main familial contact is her daughter: Blima Dessert: [phone] if we were to need to speak with anyone.   Employment status:  Retired Forensic scientist:  Medicare PT Recommendations:  Not assessed at this time Information / Referral to community resources:     Patient/Family's Response to care:  Patient expressed appreciation for CSW visit.   Patient/Family's Understanding of and Emotional Response to Diagnosis,  Current Treatment, and Prognosis:  Patient states she is feeling somewhat better at this time.   Emotional Assessment Appearance:  Appears stated age Attitude/Demeanor/Rapport:   (pleasant and cooperative) Affect (typically observed):  Calm, Pleasant Orientation:  Oriented to Self, Oriented to Place, Oriented to  Time, Oriented to Situation Alcohol / Substance use:  Not Applicable Psych involvement (Current and /or in the community):  No (Comment)  Discharge Needs  Concerns to be addressed:  Care Coordination Readmission within the last 30 days:  No Current discharge risk:  None Barriers to Discharge:  No Barriers Identified   Shela Leff, LCSW 08/15/2016, 3:12 PM

## 2016-08-15 NOTE — Evaluation (Signed)
Clinical/Bedside Swallow Evaluation Patient Details  Name: Krista Ellis MRN: [id number] Date of Birth: 03/27/1930  Today's Date: 08/15/2016 Time: SLP Start Time (ACUTE ONLY): 1107 SLP Stop Time (ACUTE ONLY): 1139 SLP Time Calculation (min) (ACUTE ONLY): 32 min  Past Medical History:  Past Medical History:  Diagnosis Date  . Arthritis   . Bleeding disorder ([place])   . Chronic cystitis   . Collagen vascular disease ([place])   . CVA (cerebral infarction) 2003  . Dysuria   . Gross hematuria   . Hematuria   . Hemihypertrophy   . History of kidney stones   . Hyperlipidemia   . Hypertension   . Murmur, cardiac   . Myocardial infarction ([place])   . Peripheral vascular disease ([place])    s/p stent right leg  . Pulmonary nodule    stable on Chest CT March 2014, Followed at [facility]  . Recurrent UTI   . Sensory urge incontinence   . Thyroid disease   . Urinary frequency    Past Surgical History:  Past Surgical History:  Procedure Laterality Date  . ABDOMINAL HYSTERECTOMY  1982  . APPENDECTOMY    . CARDIAC SURGERY     2 failed stents in right leg  . CARPAL TUNNEL RELEASE    . HIP SURGERY  2007   replaced ball in right hip  . left rotator cuff     repair  . ruptured disc    . SPINE SURGERY     steel rod in back  . stent placement leg Right 2015  . TONSILLECTOMY    . TOTAL HIP ARTHROPLASTY     right  . TOTAL KNEE ARTHROPLASTY Left    HPI:      Assessment / Plan / Recommendation Clinical Impression  pt presents with a moderate pharyngeal esophageal dysphagia characterized by multiple instances of aspiraiton pna this year with md diagnosis of likely due to gastric reflux. pt states she does not feel indigestion or reflux. slp completed education on aspiration and reflux precautions including sitting at 90* for meals, this is difficult due to neck injury. slp continued educaqtion to sit at incline for most of day and sleep at incline for reflux precautions. st educated to eat small  meals with small bites/sips with 1-2 minutes between bites. pt with no overt ssx aspiration during intake of solids or liquids and no complaints of fod becoming lodged. ST will follow up with recommended diet and strategies.  SLP Visit Diagnosis: Dysphagia, pharyngoesophageal phase (R13.14)    Aspiration Risk  Moderate aspiration risk    Diet Recommendation Regular;Thin liquid   Liquid Administration via: Cup;Straw Medication Administration: Whole meds with puree Supervision: Patient able to self feed Compensations: Minimize environmental distractions;Slow rate;Small sips/bites;Follow solids with liquid Postural Changes: Seated upright at 90 degrees    Other  Recommendations Oral Care Recommendations: Oral care BID   Follow up Recommendations        Frequency and Duration min 2x/week  1 week       Prognosis Prognosis for Safe Diet Advancement: Fair Barriers to Reach Goals: Severity of deficits      Swallow Study   General Date of Onset: 08/15/16 Type of Study: Bedside Swallow Evaluation Diet Prior to this Study: Regular;Thin liquids Temperature Spikes Noted: No Respiratory Status: Room air History of Recent Intubation: No Behavior/Cognition: Alert;Cooperative;Pleasant mood;Confused Oral Cavity Assessment: Within Functional Limits Oral Care Completed by SLP: No Oral Cavity - Dentition: Adequate natural dentition Vision: Functional for self-feeding Self-Feeding Abilities: Able  to feed self Patient Positioning: Upright in bed Baseline Vocal Quality: Normal Volitional Cough: Strong Volitional Swallow: Able to elicit    Oral/Motor/Sensory Function Overall Oral Motor/Sensory Function: Within functional limits   Ice Chips Ice chips: Within functional limits Presentation: Spoon   Thin Liquid Thin Liquid: Within functional limits Presentation: Straw;Self Fed;Cup    Nectar Thick Nectar Thick Liquid: Not tested   Honey Thick Honey Thick Liquid: Not tested   Puree Puree:  Within functional limits Presentation: Self Fed;Spoon   Solid   GO   Solid: Within functional limits Presentation: Jessy Oto 08/15/2016,2:47 PM

## 2016-08-15 NOTE — Progress Notes (Signed)
Initial Nutrition Assessment  DOCUMENTATION CODES:   Not applicable  INTERVENTION:   Ensure Enlive po BID, each supplement provides 350 kcal and 20 grams of protein  Magic cup TID with meals, each supplement provides 290 kcal and 9 grams of protein  NUTRITION DIAGNOSIS:   Inadequate oral intake related to acute illness (PNA) as evidenced by meal completion < 50%.  GOAL:   Patient will meet greater than or equal to 90% of their needs  MONITOR:   PO intake, Supplement acceptance, Labs, Weight trends  REASON FOR ASSESSMENT:   Malnutrition Screening Tool    ASSESSMENT:   83 y.o. female admitted on 08/14/2016 with pneumonia.    Met with pt and family members in room today. Pt reports a slow decline in her appetite with aging but reports that she usually eats some of everything on her plate. Pt lives at [facility] and she reports that the food is good there and that she does eat at all three meals. Per chart, pt has had weight gain; family reports that pt's wt has increased since going to [facility]. RD discussed with pt the importance of adequate protein intake to preserve lean muscle mass. Pt would like to try Ensure; RD will order. Pt ate 25% of her breakfast this morning which included one whole muffin and two strips of bacon. Pt requested Ensure at time of RD visit and was drinking this when RD left room.   Medications reviewed and include: synthroid, protonix, senokot, Vit C, Vit D3  Labs reviewed: Ca 8.7(L) Wbc- 16.6(H)  Nutrition-Focused physical exam completed. Findings are no fat depletion, severe muscle depletion in hands and lower legs, and no edema.   Diet Order:  Diet regular Room service appropriate? Yes; Fluid consistency: Thin  Skin:  Reviewed, no issues  Last BM:  7/25  Height:   Ht Readings from Last 1 Encounters:  08/14/16 5' 3"  (1.6 m)    Weight:   Wt Readings from Last 1 Encounters:  08/14/16 133 lb (60.3 kg)    Ideal Body Weight:  52.3  kg  BMI:  Body mass index is 23.56 kg/m.  Estimated Nutritional Needs:   Kcal:  1500-1700kcal/day   Protein:  66-78g/day   Fluid:  >1.5L/day   EDUCATION NEEDS:   Education needs addressed  Koleen Distance MS, RD, LDN Pager #[phone] After Hours Pager: [phone]

## 2016-08-15 NOTE — NC FL2 (Signed)
[place] LEVEL OF CARE SCREENING TOOL     IDENTIFICATION  Patient Name: Krista Ellis Birthdate: 09/26/1930 Sex: female Admission Date (Current Location): 08/14/2016  [place] and [place] Number:  Engineering geologist and Address:  [facility], [address] [place], [place] [address]      Provider Number: [id number]  Attending Physician Name and Address:  Hillary Bow, MD  Relative Name and Phone Number:       Current Level of Care: Hospital Recommended Level of Care: [place] Prior Approval Number:    Date Approved/Denied:   PASRR Number: [id number]  Discharge Plan: SNF    Current Diagnoses: Patient Active Problem List   Diagnosis Date Noted  . Pneumonia 08/14/2016  . Aspiration pneumonia of both lower lobes due to gastric secretions ([place]) 05/15/2016  . Chest pain 05/13/2016  . Acid reflux 04/13/2015  . Combined fat and carbohydrate induced hyperlipemia 04/13/2015  . Dysuria 03/01/2015  . Atrophic vaginitis 03/01/2015  . Microscopic hematuria 03/01/2015  . Bladder spasms 03/01/2015  . Pulmonary embolism ([place]) 09/22/2014  . UTI (urinary tract infection) 09/14/2014  . Enterococcus UTI 09/14/2014  . Generalized weakness 09/14/2014  . Sepsis ([place]) 09/10/2014  . Open wound of knee, leg (except thigh), and ankle, complicated 97/02/6376  . Buttock pain 07/19/2014  . Hospital discharge follow-up 07/04/2014  . Facial droop   . Malnutrition of moderate degree ([place]) 05/21/2014  . Seizures ([place])   . TIA (transient ischemic attack) 05/18/2014  . Systolic murmur 58/85/0277  . Gait disturbance 05/15/2014  . Benign essential HTN 05/08/2014  . History of subdural hematoma   . Subdural hematoma ([place]) 05/05/2014  . B12 deficiency 03/09/2014  . Bradycardia 02/24/2014  . Arteriosclerosis of coronary artery 02/23/2014  . Recurrent UTI 01/03/2014  . Bilateral hand numbness 01/03/2014  . Nephrolithiasis  11/18/2013  . Recurrent falls 11/18/2013  . Closed rib fracture 09/25/2013  . Abnormal CT scan, chest 09/01/2013  . Chest wall pain 08/31/2013  . Hematoma and contusion 08/31/2013  . Atypical chest pain 06/17/2013  . Peripheral vascular disease ([place]) 06/17/2013  . Heart valve disease 06/17/2013  . Atherosclerotic peripheral vascular disease ([place]) 06/01/2013  . Bladder wall hemorrhage 12/10/2012  . Dysphagia, pharyngoesophageal phase 12/10/2012  . Malaise and fatigue 12/10/2012  . Frank hematuria 12/02/2012  . Bladder retention 12/02/2012  . Postmenopausal estrogen deficiency 11/30/2012  . Glucose found in urine on examination 11/30/2012  . Post menopausal syndrome 11/30/2012  . Type 2 diabetes mellitus with hyperglycemia ([place]) 11/24/2012  . Lung nodule, solitary 03/12/2012  . Diverticular disease of large intestine 12/25/2011  . 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 Symptoms involving urinary system 12/04/2011  . Medicare annual wellness visit, subsequent 10/01/2011  . Dyslipidemia 10/01/2011  . HLD (hyperlipidemia) 10/01/2011  . Encounter for general adult medical examination without abnormal findings 10/01/2011  . Bladder infection, chronic 09/29/2011  . Incomplete bladder emptying 09/29/2011  . Urge incontinence of urine 09/29/2011  . Delayed onset of urination 09/29/2011  . Hypertension 10/17/2010  . Hypothyroidism 10/17/2010  . Osteoarthritis 10/17/2010  . Essential (primary) hypertension 10/17/2010  . Arthritis, degenerative 10/17/2010  . CN (constipation) 05/02/2010    Orientation RESPIRATION BLADDER Height & Weight     Self, Time, Situation, Place  Normal, O2 (2 liters) Incontinent Weight: 133 lb (60.3 kg) Height:  5\' 3"  (160 cm)  BEHAVIORAL SYMPTOMS/MOOD NEUROLOGICAL BOWEL NUTRITION STATUS   (none)  (none) Incontinent Diet (regular)  AMBULATORY  STATUS COMMUNICATION OF NEEDS Skin   Extensive Assist Verbally Normal                        Personal Care Assistance Level of Assistance  Bathing, Dressing, Feeding Bathing Assistance: Limited assistance Feeding assistance: Limited assistance Dressing Assistance: Limited assistance     Functional Limitations Info   (no issues)          SPECIAL CARE FACTORS FREQUENCY                       Contractures Contractures Info: Not present    Additional Factors Info  Allergies, Code Status Code Status Info: dnr Allergies Info: sulfa abx, cipro, pcn's           Current Medications (08/15/2016):  This is the current hospital active medication list Current Facility-Administered Medications  Medication Dose Route Frequency Provider Last Rate Last Dose  . acetaminophen (TYLENOL) tablet 650 mg  650 mg Oral Q6H PRN Baxter Hire, MD   650 mg at 08/14/16 2112   Or  . acetaminophen (TYLENOL) suppository 650 mg  650 mg Rectal Q6H PRN Baxter Hire, MD      . acetaminophen (TYLENOL) tablet 650 mg  650 mg Oral Q8H Baxter Hire, MD   650 mg at 08/15/16 0867  . acidophilus (RISAQUAD) capsule 1 capsule  1 capsule Oral Daily Baxter Hire, MD   1 capsule at 08/15/16 1052  . amLODipine (NORVASC) tablet 5 mg  5 mg Oral Daily Baxter Hire, MD   5 mg at 08/15/16 1052  . apixaban (ELIQUIS) tablet 5 mg  5 mg Oral BID Baxter Hire, MD   5 mg at 08/15/16 1053  . bisacodyl (DULCOLAX) suppository 10 mg  10 mg Rectal Daily PRN Baxter Hire, MD      . dextromethorphan-guaiFENesin (ROBITUSSIN-DM) 10-100 MG/5ML liquid 10 mL  10 mL Oral Q4H PRN Baxter Hire, MD      . feeding supplement (ENSURE ENLIVE) (ENSURE ENLIVE) liquid 237 mL  237 mL Oral BID BM Sudini, Alveta Heimlich, MD      . Derrill Memo ON 08/16/2016] levofloxacin (LEVAQUIN) IVPB 750 mg  750 mg Intravenous Q48H Swayne, Mary M, RPH      . levothyroxine (SYNTHROID, LEVOTHROID) tablet 25 mcg  25 mcg Oral Daily Baxter Hire, MD   25 mcg at 08/15/16 [phone]  . menthol-cetylpyridinium (CEPACOL) lozenge 3 mg  1 lozenge  Oral PRN Baxter Hire, MD      . metoprolol succinate (TOPROL-XL) 24 hr tablet 12.5 mg  12.5 mg Oral Daily Harrel Lemon D, MD   12.5 mg at 08/15/16 1053  . ondansetron (ZOFRAN) tablet 4 mg  4 mg Oral Q6H PRN Baxter Hire, MD   4 mg at 08/14/16 2113   Or  . ondansetron [facility]) injection 4 mg  4 mg Intravenous Q6H PRN Baxter Hire, MD      . pantoprazole (PROTONIX) EC tablet 40 mg  40 mg Oral Daily Baxter Hire, MD   40 mg at 08/15/16 1053  . polyethylene glycol (MIRALAX / GLYCOLAX) packet 17 g  17 g Oral Daily PRN Baxter Hire, MD      . pravastatin (PRAVACHOL) tablet 40 mg  40 mg Oral QPM Baxter Hire, MD   40 mg at 08/14/16 2117  . senna (SENOKOT) tablet 17.2 mg  2 tablet Oral q morning - 10a Harrel Lemon  D, MD   17.2 mg at 08/15/16 1052  . sodium chloride (OCEAN) 0.65 % nasal spray 1 spray  1 spray Each Nare Q2H PRN Baxter Hire, MD      . traMADol Veatrice Bourbon) tablet 25 mg  25 mg Oral Q8H PRN Baxter Hire, MD   25 mg at 08/14/16 2111  . vitamin C (ASCORBIC ACID) tablet 1,000 mg  1,000 mg Oral Daily Baxter Hire, MD      . Derrill Memo ON 09/01/2016] Vitamin D3 CAPS 50,000 Units  50,000 Units Oral Q30 days Baxter Hire, MD         Discharge Medications: Please see discharge summary for a list of discharge medications.  Relevant Imaging Results:  Relevant Lab Results:   Additional Information ss: [id number]  Shela Leff, LCSW

## 2016-08-16 LAB — CBC WITH DIFFERENTIAL/PLATELET
BASOS ABS: 0 10*3/uL (ref 0–0.1)
BASOS PCT: 0 %
Eosinophils Absolute: 0 10*3/uL (ref 0–0.7)
Eosinophils Relative: 0 %
HEMATOCRIT: 34.5 % — AB (ref 35.0–47.0)
HEMOGLOBIN: 11.6 g/dL — AB (ref 12.0–16.0)
LYMPHS PCT: 17 %
Lymphs Abs: 1.4 10*3/uL (ref 1.0–3.6)
MCH: 31.7 pg (ref 26.0–34.0)
MCHC: 33.6 g/dL (ref 32.0–36.0)
MCV: 94.5 fL (ref 80.0–100.0)
Monocytes Absolute: 0.7 10*3/uL (ref 0.2–0.9)
Monocytes Relative: 9 %
NEUTROS ABS: 6 10*3/uL (ref 1.4–6.5)
NEUTROS PCT: 74 %
Platelets: 273 10*3/uL (ref 150–440)
RBC: 3.66 MIL/uL — AB (ref 3.80–5.20)
RDW: 13.5 % (ref 11.5–14.5)
WBC: 8.1 10*3/uL (ref 3.6–11.0)

## 2016-08-16 MED ORDER — GUAIFENESIN-DM 100-10 MG/5ML PO SYRP: 5.0000 mL | ORAL_SOLUTION | Freq: Four times a day (QID) | ORAL | 0 refills | Status: DC | PRN

## 2016-08-16 MED ORDER — LEVOFLOXACIN 500 MG PO TABS: 500.0000 mg | ORAL_TABLET | Freq: Every day | ORAL | 0 refills | Status: DC

## 2016-08-16 NOTE — Progress Notes (Signed)
Speech Language Pathology Dysphagia Treatment Patient Details Name: Krista Ellis MRN: [id number] DOB: 09/15/1930 Today's Date: 08/16/2016 Time: 5797-2820 SLP Time Calculation (min) (ACUTE ONLY): 22 min  Assessment / Plan / Recommendation Clinical Impression   pt continues to present with a mild pharyngeal esophageal dysphagia characterized by reflux and need for aspiration and reflux precautions during and post meals to decrease risk of aspiration of solids and liquids. Pt had no overt ssx aspiration during intake of solids and liquids with compensatory strategies. SLP reviewed strategies that were given in a hand out on 08/15/2016. Pt was able to read to recall strategies and states she utilized strategies over last 24 hours. Family present for session and confirmed pt utilizes strategies as written.     Diet Recommendation    Regular with thin, aspiration and reflux precautions.    SLP Plan Continue with current plan of care   Pertinent Vitals/Pain None reported    Swallowing Goals     General Behavior/Cognition: Alert;Cooperative;Pleasant mood Patient Positioning: Upright in chair/[name] Oral care provided: N/A  Oral Cavity - Oral Hygiene     Dysphagia Treatment Treatment Methods: Skilled observation;Upgraded PO texture trial;Patient/caregiver education;Compensation strategy training Patient observed directly with PO's: Yes Type of PO's observed: Regular;Thin liquids Feeding: Able to feed self Liquids provided via: Cup;Straw Type of cueing: Verbal Amount of cueing: Modified independent   GO     Krista Ellis 08/16/2016, 11:40 AM

## 2016-08-16 NOTE — Progress Notes (Signed)
Discharge order received. Patient is alert and oriented. Vital signs stable . No signs of acute distress. Discharge instructions given to patient and family member.Patient/family  verbalized understanding. Report given to Jesse,nurse at [facility]. No other issues noted at this time. Daughter will transport patient back to [facility].

## 2016-08-16 NOTE — Discharge Summary (Signed)
[place] [facility] [place] NAME: Krista Ellis    MR#:  [id number]  DATE OF BIRTH:  12-06-1930  DATE OF ADMISSION:  08/14/2016 ADMITTING PHYSICIAN: Harrel Lemon, MD  DATE OF DISCHARGE: 08/16/2016  PRIMARY CARE PHYSICIAN: Venia Carbon, MD   ADMISSION DIAGNOSIS:  Chest pain [R07.9] Sepsis, due to unspecified organism [facility]) [A41.9] Community acquired pneumonia of right upper lobe of lung ([place]) [J18.1]  DISCHARGE DIAGNOSIS:  Active Problems:   Pneumonia   SECONDARY DIAGNOSIS:   Past Medical History:  Diagnosis Date  . Arthritis   . Bleeding disorder ([place])   . Chronic cystitis   . Collagen vascular disease ([place])   . CVA (cerebral infarction) 2003  . Dysuria   . Gross hematuria   . Hematuria   . Hemihypertrophy   . History of kidney stones   . Hyperlipidemia   . Hypertension   . Murmur, cardiac   . Myocardial infarction ([place])   . Peripheral vascular disease ([place])    s/p stent right leg  . Pulmonary nodule    stable on Chest CT March 2014, Followed at [facility]  . Recurrent UTI   . Sensory urge incontinence   . Thyroid disease   . Urinary frequency      ADMITTING HISTORY  Chief Complaint: Cough HPI: This is a 83 year old female who is her resident of 20 legs. She's had several days of cough. She's felt weak today. She was seen by her primary care's PA and started on medication for a suspected UTI  HOSPITAL COURSE:   * Right upper lobe pneumonia with sepsis and acute hypoxic respiratory failure Started on IV Levaquin and patient improved well. Cultures negative. Nebulizers as needed use. Weaned off oxygen. White blood cell count has trended from 16,000 to 8000 today. Afebrile.  * Acute encephalopathy due to pneumonia present on admission has resolved  * GERD. Continue PPIs.  * Chronic cystitis  * History of pulmonary embolism. Patient is on eliquis.  Patient stable for discharge back to her skilled nursing  facility.  CONSULTS OBTAINED:    DRUG ALLERGIES:   Allergies  Allergen Reactions  . Sulfa Antibiotics Hives and Itching  . Ciprofloxacin Hives and Itching  . Penicillins Hives and Itching    .Has patient had a PCN reaction causing immediate rash, facial/tongue/throat swelling, SOB or lightheadedness with hypotension: Unknown Has patient had a PCN reaction causing severe rash involving mucus membranes or skin necrosis: Unknown Has patient had a PCN reaction that required hospitalization: Unknown Has patient had a PCN reaction occurring within the last 10 years: Unknown If all of the above answers are "NO", then may proceed with Cephalosporin use.     DISCHARGE MEDICATIONS:   Current Discharge Medication List    START taking these medications   Details  guaiFENesin-dextromethorphan (ROBITUSSIN DM) 100-10 MG/5ML syrup Take 5 mLs by mouth every 6 (six) hours as needed for cough. Qty: 118 mL, Refills: 0    levofloxacin (LEVAQUIN) 500 MG tablet Take 1 tablet (500 mg total) by mouth daily. Qty: 5 tablet, Refills: 0      CONTINUE these medications which have NOT CHANGED   Details  acetaminophen (TYLENOL) 325 MG tablet Take 325 mg by mouth every 4 (four) hours as needed for mild pain, moderate pain, fever or headache. Reported on 02/27/2015    acetaminophen (TYLENOL) 650 MG CR tablet Take 650 mg by mouth every 8 (eight) hours.    amLODipine (NORVASC) 5 MG tablet  Take 5 mg by mouth daily.    apixaban (ELIQUIS) 5 MG TABS tablet Two tablets twice a day for six days then one tablet twice a day afterwards Qty: 72 tablet, Refills: 0    Ascorbic Acid (VITAMIN C) 1000 MG tablet Take 1 tablet (1,000 mg total) by mouth daily. Qty: 90 tablet, Refills: 3    bisacodyl (DULCOLAX) 10 MG suppository Place 10 mg rectally daily as needed for moderate constipation.    cetirizine (ZYRTEC) 10 MG tablet Take 10 mg by mouth daily.    Cholecalciferol (VITAMIN D3) 50000 units CAPS Take 50,000 Units by  mouth every 30 (thirty) days. Patient takes on the 13th    [place]berry 425 MG CAPS Take 425 mg by mouth daily.    cyanocobalamin (,VITAMIN B-12,) 1000 MCG/ML injection Inject 1,000 mcg into the muscle every 30 (thirty) days. On the 2nd of each month    dextromethorphan-guaiFENesin (ROBITUSSIN-DM) 10-100 MG/5ML liquid Take 10 mLs by mouth every 4 (four) hours as needed for cough.    hydrOXYzine (ATARAX/VISTARIL) 25 MG tablet Take 12.5 mg by mouth 3 (three) times daily as needed for itching.    levothyroxine (SYNTHROID, LEVOTHROID) 25 MCG tablet TAKE 1 TABLET (25 MCG TOTAL) BY MOUTH DAILY. Qty: 90 tablet, Refills: 1    magnesium hydroxide (MILK OF MAGNESIA) 400 MG/5ML suspension Take 30 mLs by mouth daily as needed for mild constipation or moderate constipation.    menthol-cetylpyridinium (CEPACOL) 3 MG lozenge Take 1 lozenge by mouth as needed for sore throat.    metoprolol succinate (TOPROL-XL) 25 MG 24 hr tablet Take 12.5 mg by mouth daily.     omeprazole (PRILOSEC) 20 MG capsule Take 1 capsule (20 mg total) by mouth 2 (two) times daily before a meal. Qty: 60 capsule, Refills: 0    ondansetron (ZOFRAN) 4 MG tablet Take 4 mg by mouth every 8 (eight) hours as needed for nausea or vomiting.    phenazopyridine (PYRIDIUM) 100 MG tablet Take 100 mg by mouth 2 (two) times daily as needed for pain.    polyethylene glycol (MIRALAX / GLYCOLAX) packet Take 17 g by mouth daily as needed for mild constipation, moderate constipation or severe constipation. Reported on 02/27/2015    pravastatin (PRAVACHOL) 40 MG tablet Take 1 tablet (40 mg total) by mouth every evening. Qty: 90 tablet, Refills: 1    Probiotic Product (ALIGN) 4 MG CAPS Take 1 capsule by mouth daily.    senna (SENOKOT) 8.6 MG TABS tablet Take 2 tablets by mouth daily.    sodium chloride (OCEAN) 0.65 % SOLN nasal spray Place 1 spray into both nostrils every 2 (two) hours as needed for congestion.    traMADol (ULTRAM) 50 MG tablet  Take 0.5 tablets (25 mg total) by mouth every 8 (eight) hours as needed for moderate pain. Qty: 30 tablet, Refills: 0        Today   VITAL SIGNS:  Blood pressure (!) 145/48, pulse 75, temperature 98.2 F (36.8 C), temperature source Oral, resp. rate (!) 24, height 5\' 3"  (1.6 m), weight 60.3 kg (133 lb), SpO2 96 %.  I/O:   Intake/Output Summary (Last 24 hours) at 08/16/16 1158 Last data filed at 08/16/16 0300  Gross per 24 hour  Intake                0 ml  Output                0 ml  Net  0 ml    PHYSICAL EXAMINATION:  Physical Exam  GENERAL:  83 y.o.-year-old patient lying in the bed with no acute distress.  LUNGS: Normal breath sounds bilaterally, no wheezing, rales,rhonchi or crepitation. No use of accessory muscles of respiration.  CARDIOVASCULAR: S1, S2 normal. No murmurs, rubs, or gallops.  ABDOMEN: Soft, non-tender, non-distended. Bowel sounds present. No organomegaly or mass.  NEUROLOGIC: Moves all 4 extremities. PSYCHIATRIC: The patient is alert and oriented x 3.  SKIN: No obvious rash, lesion, or ulcer.   DATA REVIEW:   CBC  Recent Labs Lab 08/16/16 0334  WBC 8.1  HGB 11.6*  HCT 34.5*  PLT 273    Chemistries   Recent Labs Lab 08/15/16 0515  NA 138  K 4.8  CL 107  CO2 24  GLUCOSE 102*  BUN 19  CREATININE 0.76  CALCIUM 8.7*    Cardiac Enzymes  Recent Labs Lab 08/14/16 1522  TROPONINI <0.03    Microbiology Results  Results for orders placed or performed during the hospital encounter of 08/14/16  MRSA PCR Screening     Status: Abnormal   Collection Time: 08/14/16  3:19 PM  Result Value Ref Range Status   MRSA by PCR POSITIVE (A) NEGATIVE Final    Comment:        The GeneXpert MRSA Assay (FDA approved for NASAL specimens only), is one component of a comprehensive MRSA colonization surveillance program. It is not intended to diagnose MRSA infection nor to guide or monitor treatment for MRSA infections. RESULT CALLED  TO, READ BACK BY AND VERIFIED WITH: CRYSTAL BASS 08/14/16 AT 2206 BY HS   Blood Culture (routine x 2)     Status: None (Preliminary result)   Collection Time: 08/14/16  4:03 PM  Result Value Ref Range Status   Specimen Description BLOOD BLOOD LEFT WRIST  Final   Special Requests   Final    BOTTLES DRAWN AEROBIC AND ANAEROBIC Blood Culture adequate volume   Culture NO GROWTH < 24 HOURS  Final   Report Status PENDING  Incomplete  Blood Culture (routine x 2)     Status: None (Preliminary result)   Collection Time: 08/14/16  4:03 PM  Result Value Ref Range Status   Specimen Description BLOOD BLOOD LEFT ARM  Final   Special Requests   Final    BOTTLES DRAWN AEROBIC AND ANAEROBIC Blood Culture results may not be optimal due to an inadequate volume of blood received in culture bottles   Culture NO GROWTH < 24 HOURS  Final   Report Status PENDING  Incomplete    RADIOLOGY:  Dg Chest 2 View  Result Date: 08/14/2016 CLINICAL DATA:  Chest pain and shortness of breath associated with hypoxia. Chronic nonproductive cough. Nonsmoker. History of previous MI and pulmonary embolism EXAM: CHEST  2 VIEW COMPARISON:  PA and lateral chest x-ray of May 15, 2016 FINDINGS: The lungs are adequately inflated. There is confluent alveolar opacity in the right upper lobe. The interstitial markings elsewhere are coarse but not greatly changed from the previous study. There is no pleural effusion. The heart is top-normal in size. The pulmonary vascularity is not engorged. There are severe degenerative changes of both shoulders greatest on the left. The thoracic vertebral bodies are preserved in height where visualized. IMPRESSION: Probable acute pneumonia in the right upper lobe. Underlying chronic interstitial changes. Followup PA and lateral chest X-ray is recommended in 3-4 weeks following trial of antibiotic therapy to ensure resolution and exclude underlying malignancy. Electronically Signed  By: David  Martinique M.D.    On: 08/14/2016 16:39    Follow up with PCP in 1 week.  Management plans discussed with the patient, family and they are in agreement.  CODE STATUS:     Code Status Orders        Start     Ordered   08/14/16 1948  Do not attempt resuscitation (DNR)  Continuous    Question Answer Comment  In the event of cardiac or respiratory ARREST Do not call a "code blue"   In the event of cardiac or respiratory ARREST Do not perform Intubation, CPR, defibrillation or ACLS   In the event of cardiac or respiratory ARREST Use medication by any route, position, wound care, and other measures to relive pain and suffering. May use oxygen, suction and manual treatment of airway obstruction as needed for comfort.      08/14/16 1947    Code Status History    Date Active Date Inactive Code Status Order ID Comments User Context   05/13/2016  8:17 AM 05/15/2016  7:51 PM DNR [id number]  Harrie Foreman, MD Inpatient   01/26/2016  1:58 AM 01/27/2016  3:01 PM DNR [id number]  Harvie Bridge, DO Inpatient   01/26/2016  1:27 AM 01/26/2016  1:58 AM Full Code [id number]  Hugelmeyer, Clayton, DO Inpatient   09/22/2014  4:21 PM 09/24/2014  5:08 PM DNR [id number]  Vaughan Basta, MD Inpatient   09/10/2014  9:37 PM 09/14/2014  7:24 PM DNR [id number]  Gladstone Lighter, MD Inpatient   05/18/2014  7:03 PM 05/24/2014  4:31 PM DNR [id number]  Samella Parr, NP Inpatient   05/05/2014 11:15 PM 05/06/2014  5:26 PM Partial Code [id number]  Mariea Clonts, MD Inpatient    Advance Directive Documentation     Most Recent Value  Type of Advance Directive  Out of facility DNR (pink MOST or yellow form)  Pre-existing out of facility DNR order (yellow form or pink MOST form)  Physician notified to receive inpatient order, Yellow form placed in chart (order not valid for inpatient use)  "MOST" Form in Place?  -      TOTAL TIME TAKING CARE OF THIS PATIENT ON DAY OF DISCHARGE: more than 30 minutes.   Hillary Bow R M.D on 08/16/2016 at  11:58 AM  Between 7am to 6pm - Pager - [phone]  After 6pm go to [email or web address] - password [email or web address] [facility]  Office  [phone]  CC: Primary care physician; Venia Carbon, MD  Note: This dictation was prepared with Dragon dictation along with smaller phrase technology. Any transcriptional errors that result from this process are unintentional.

## 2016-08-16 NOTE — Evaluation (Signed)
Physical Therapy Evaluation Patient Details Name: Krista Ellis MRN: [id number] DOB: 06-05-1930 Today's Date: 08/16/2016   History of Present Illness  Pt is an 83 y.o. female presenting to hospital with SOB.  Pt admitted to hospital with RUL PNA with sepsis and acute hypoxic respiratory failure; also acute encephalopathy d/t PNA.  PMH includes CVA (with L sided weakness), htn, MI, PVD, PE, h/o SDH, CTR, L RCR, R THA, L TKA.  Clinical Impression  Prior to hospital admission, pt was performing functional mobility with assist and able to walk longer distances with RW and assist.  Pt lives at [facility].  Currently pt is min assist with transfers and CGA with ambulation 50 feet with RW.  Pt demonstrating increased difficulty with distance ambulated and appearing SOB (O2 sats 89-90% on room air post ambulation; nursing notified).  Currently pt appears deconditioned and below baseline in terms of functional mobility (per discussion with pt and pt's daughter).  Pt would benefit from skilled PT to address noted impairments and functional limitations (see below for any additional details).  Upon hospital discharge, recommend pt discharge back to facility with HHPT and 24/7 assist for functional mobility.    Follow Up Recommendations Home health PT;Supervision/Assistance - 24 hour    Equipment Recommendations  Rolling walker with 5" wheels    Recommendations for Other Services       Precautions / Restrictions Precautions Precautions: Fall Restrictions Weight Bearing Restrictions: No      Mobility  Bed Mobility               General bed mobility comments: Defered d/t pt sitting up in chair beginning and end of session.  Transfers Overall transfer level: Needs assistance Equipment used: Rolling walker (2 wheeled) Transfers: Sit to/from Stand Sit to Stand: Min assist         General transfer comment: assist to initiate stand up to RW with vc's for hand placement; assist to  control descent sitting  Ambulation/Gait Ambulation/Gait assistance: Min guard Ambulation Distance (Feet): 50 Feet Assistive device: Rolling walker (2 wheeled)   Gait velocity: decreased (especially with distance)   General Gait Details: decreased B step length; difficulty advancing L LE compared to R LE (pt's daughter reports baseline d/t h/o stroke)  Financial trader Rankin (Stroke Patients Only)       Balance Overall balance assessment: Needs assistance Sitting-balance support: No upper extremity supported;Feet supported Sitting balance-Leahy Scale: Good Sitting balance - Comments: sitting reaching within BOS   Standing balance support: Bilateral upper extremity supported Standing balance-Leahy Scale: Poor Standing balance comment: CGA standing with B UE support on RW                             Pertinent Vitals/Pain Pain Assessment: No/denies pain  HR WFL during session.    Home Living Family/patient expects to be discharged to:: Skilled nursing facility                 Additional Comments: [facility] term care at [facility]    Prior Function           Comments: Always has assist with any functional mobility d/t h/o falling.  Ambulates with staff using RW (can walk longer distances equal to ambulating around nursing loop at hospital).     Hand Dominance  Extremity/Trunk Assessment   Upper Extremity Assessment Upper Extremity Assessment: Generalized weakness    Lower Extremity Assessment Lower Extremity Assessment: Generalized weakness       Communication   Communication: HOH  Cognition Arousal/Alertness: Awake/alert Behavior During Therapy: WFL for tasks assessed/performed Overall Cognitive Status: Within Functional Limits for tasks assessed                                        General Comments   Nursing cleared pt for participation in physical therapy.  Pt agreeable  to PT session.  Pt's daughter present after session was finished.    Exercises     Assessment/Plan    PT Assessment Patient needs continued PT services  PT Problem List Decreased strength;Decreased activity tolerance;Decreased balance;Decreased mobility       PT Treatment Interventions DME instruction;Gait training;Functional mobility training;Therapeutic activities;Therapeutic exercise;Balance training;Patient/family education    PT Goals (Current goals can be found in the Care Plan section)  Acute Rehab PT Goals Patient Stated Goal: to go back to [facility] PT Goal Formulation: With patient/family Time For Goal Achievement: 08/30/16 Potential to Achieve Goals: Good    Frequency Min 2X/week   Barriers to discharge        Co-evaluation               AM-PAC PT "6 Clicks" Daily Activity  Outcome Measure Difficulty turning over in bed (including adjusting bedclothes, sheets and blankets)?: Total Difficulty moving from lying on back to sitting on the side of the bed? : Total Difficulty sitting down on and standing up from a chair with arms (e.g., wheelchair, bedside commode, etc,.)?: Total Help needed moving to and from a bed to chair (including a wheelchair)?: A Little Help needed walking in hospital room?: A Little Help needed climbing 3-5 steps with a railing? : A Lot 6 Click Score: 11    End of Session Equipment Utilized During Treatment: Gait belt Activity Tolerance: Patient limited by fatigue Patient left: in chair;with call bell/phone within reach;with chair alarm set Nurse Communication: Mobility status;Precautions (Pt's O2 sats during session.) PT Visit Diagnosis: Other abnormalities of gait and mobility (R26.89);Muscle weakness (generalized) (M62.81);History of falling (Z91.81)    Time: 8841-6606 PT Time Calculation (min) (ACUTE ONLY): 21 min   Charges:   PT Evaluation $PT Eval Low Complexity: 1 Procedure     PT G Codes:   PT G-Codes **NOT FOR  INPATIENT CLASS** Functional Assessment Tool Used: AM-PAC 6 Clicks Basic Mobility Functional Limitation: Mobility: Walking and moving around Mobility: Walking and Moving Around Current Status (T0160): At least 60 percent but less than 80 percent impaired, limited or restricted Mobility: Walking and Moving Around Goal Status [phone]): At least 20 percent but less than 40 percent impaired, limited or restricted    Leitha Bleak, PT 08/16/16, 2:53 PM [phone]

## 2016-08-16 NOTE — Discharge Instructions (Signed)
Resume diet and activity as before ° ° °

## 2016-08-19 DIAGNOSIS — J181 Lobar pneumonia, unspecified organism: Secondary | ICD-10-CM | POA: Diagnosis not present

## 2016-08-19 LAB — CULTURE, BLOOD (ROUTINE X 2)
CULTURE: NO GROWTH
CULTURE: NO GROWTH
SPECIAL REQUESTS: ADEQUATE

## 2016-08-21 DIAGNOSIS — R2681 Unsteadiness on feet: Secondary | ICD-10-CM | POA: Diagnosis not present

## 2016-08-21 DIAGNOSIS — M6281 Muscle weakness (generalized): Secondary | ICD-10-CM | POA: Diagnosis not present

## 2016-08-26 DIAGNOSIS — R2681 Unsteadiness on feet: Secondary | ICD-10-CM | POA: Diagnosis not present

## 2016-08-26 DIAGNOSIS — M6281 Muscle weakness (generalized): Secondary | ICD-10-CM | POA: Diagnosis not present

## 2016-08-28 DIAGNOSIS — M6281 Muscle weakness (generalized): Secondary | ICD-10-CM | POA: Diagnosis not present

## 2016-08-28 DIAGNOSIS — R2681 Unsteadiness on feet: Secondary | ICD-10-CM | POA: Diagnosis not present

## 2016-08-29 DIAGNOSIS — M6281 Muscle weakness (generalized): Secondary | ICD-10-CM | POA: Diagnosis not present

## 2016-08-29 DIAGNOSIS — R2681 Unsteadiness on feet: Secondary | ICD-10-CM | POA: Diagnosis not present

## 2016-08-31 ENCOUNTER — Emergency Department
Admission: EM | Admit: 2016-08-31 | Discharge: 2016-08-31 | Disposition: A | Discharge status: Home / Self Care | Payer: Medicare Other | Attending: Emergency Medicine | Admitting: Emergency Medicine

## 2016-08-31 DIAGNOSIS — Y929 Unspecified place or not applicable: Secondary | ICD-10-CM | POA: Diagnosis not present

## 2016-08-31 DIAGNOSIS — Z96641 Presence of right artificial hip joint: Secondary | ICD-10-CM | POA: Insufficient documentation

## 2016-08-31 DIAGNOSIS — E119 Type 2 diabetes mellitus without complications: Secondary | ICD-10-CM | POA: Diagnosis not present

## 2016-08-31 DIAGNOSIS — E039 Hypothyroidism, unspecified: Secondary | ICD-10-CM | POA: Insufficient documentation

## 2016-08-31 DIAGNOSIS — Y999 Unspecified external cause status: Secondary | ICD-10-CM | POA: Insufficient documentation

## 2016-08-31 DIAGNOSIS — Z96652 Presence of left artificial knee joint: Secondary | ICD-10-CM | POA: Diagnosis not present

## 2016-08-31 DIAGNOSIS — W01190A Fall on same level from slipping, tripping and stumbling with subsequent striking against furniture, initial encounter: Secondary | ICD-10-CM | POA: Diagnosis not present

## 2016-08-31 DIAGNOSIS — R58 Hemorrhage, not elsewhere classified: Secondary | ICD-10-CM | POA: Diagnosis not present

## 2016-08-31 DIAGNOSIS — Y939 Activity, unspecified: Secondary | ICD-10-CM | POA: Insufficient documentation

## 2016-08-31 DIAGNOSIS — Z743 Need for continuous supervision: Secondary | ICD-10-CM | POA: Diagnosis not present

## 2016-08-31 DIAGNOSIS — Z79899 Other long term (current) drug therapy: Secondary | ICD-10-CM | POA: Diagnosis not present

## 2016-08-31 DIAGNOSIS — S81812A Laceration without foreign body, left lower leg, initial encounter: Secondary | ICD-10-CM | POA: Diagnosis not present

## 2016-08-31 DIAGNOSIS — Z7901 Long term (current) use of anticoagulants: Secondary | ICD-10-CM | POA: Diagnosis not present

## 2016-08-31 DIAGNOSIS — S81829A Laceration with foreign body, unspecified lower leg, initial encounter: Secondary | ICD-10-CM | POA: Diagnosis not present

## 2016-08-31 DIAGNOSIS — I10 Essential (primary) hypertension: Secondary | ICD-10-CM | POA: Diagnosis not present

## 2016-08-31 DIAGNOSIS — M79605 Pain in left leg: Secondary | ICD-10-CM | POA: Diagnosis not present

## 2016-08-31 MED ORDER — SILVER NITRATE-POT NITRATE 75-25 % EX MISC
CUTANEOUS | Status: AC
  Filled 2016-08-31: qty 1

## 2016-08-31 NOTE — Discharge Instructions (Signed)
Leave the coban dressing in place for at least 24 hours before removing.  Then the dressing can be gently changed once a day, but the steri-strips should not be removed for 1 week.  Follow up with your primary care doctor.

## 2016-08-31 NOTE — ED Triage Notes (Signed)
Per EMS pt comes from [facility].  Pt has a skin tear on left leg.  RN at [facility] took off the scab while changing the dressing.  RN wanted pt to have a doctor look at it and cauterize it.

## 2016-08-31 NOTE — ED Provider Notes (Signed)
[facility] Emergency Department Provider Note  ____________________________________________  Time seen: Approximately 10:06 AM  I have reviewed the triage vital signs and the nursing notes.   HISTORY  Chief Complaint Fall    HPI Krista Ellis is a 83 y.o. female sent to the ED due to bleeding from a skin tear on the left leg just proximal to the medial ankle. This is from banging her leg on a bed 2 days ago. When the nurse at 10 mics was trying to change the bandage this morning of a dislodged a clot causing the bleeding again. The patient is on Eliquis. No new trauma. No fevers chills or rashes.     Past Medical History:  Diagnosis Date  . Arthritis   . Bleeding disorder ([place])   . Chronic cystitis   . Collagen vascular disease ([place])   . CVA (cerebral infarction) 2003  . Dysuria   . Gross hematuria   . Hematuria   . Hemihypertrophy   . History of kidney stones   . Hyperlipidemia   . Hypertension   . Murmur, cardiac   . Myocardial infarction ([place])   . Peripheral vascular disease ([place])    s/p stent right leg  . Pulmonary nodule    stable on Chest CT March 2014, Followed at [facility]  . Recurrent UTI   . Sensory urge incontinence   . Thyroid disease   . Urinary frequency      Patient Active Problem List   Diagnosis Date Noted  . Pneumonia 08/14/2016  . Aspiration pneumonia of both lower lobes due to gastric secretions ([place]) 05/15/2016  . Chest pain 05/13/2016  . Acid reflux 04/13/2015  . Combined fat and carbohydrate induced hyperlipemia 04/13/2015  . Dysuria 03/01/2015  . Atrophic vaginitis 03/01/2015  . Microscopic hematuria 03/01/2015  . Bladder spasms 03/01/2015  . Pulmonary embolism ([place]) 09/22/2014  . UTI (urinary tract infection) 09/14/2014  . Enterococcus UTI 09/14/2014  . Generalized weakness 09/14/2014  . Sepsis ([place]) 09/10/2014  . Open wound of knee, leg (except thigh), and ankle, complicated 16/10/9602  . Buttock pain  07/19/2014  . Hospital discharge follow-up 07/04/2014  . Facial droop   . Malnutrition of moderate degree ([place]) 05/21/2014  . Seizures ([place])   . TIA (transient ischemic attack) 05/18/2014  . Systolic murmur 54/09/8117  . Gait disturbance 05/15/2014  . Benign essential HTN 05/08/2014  . History of subdural hematoma   . Subdural hematoma ([place]) 05/05/2014  . B12 deficiency 03/09/2014  . Bradycardia 02/24/2014  . Arteriosclerosis of coronary artery 02/23/2014  . Recurrent UTI 01/03/2014  . Bilateral hand numbness 01/03/2014  . Nephrolithiasis 11/18/2013  . Recurrent falls 11/18/2013  . Closed rib fracture 09/25/2013  . Abnormal CT scan, chest 09/01/2013  . Chest wall pain 08/31/2013  . Hematoma and contusion 08/31/2013  . Atypical chest pain 06/17/2013  . Peripheral vascular disease ([place]) 06/17/2013  . Heart valve disease 06/17/2013  . Atherosclerotic peripheral vascular disease ([place]) 06/01/2013  . Bladder wall hemorrhage 12/10/2012  . Dysphagia, pharyngoesophageal phase 12/10/2012  . Malaise and fatigue 12/10/2012  . Frank hematuria 12/02/2012  . Bladder retention 12/02/2012  . Postmenopausal estrogen deficiency 11/30/2012  . Glucose found in urine on examination 11/30/2012  . Post menopausal syndrome 11/30/2012  . Type 2 diabetes mellitus with hyperglycemia ([place]) 11/24/2012  . Lung nodule, solitary 03/12/2012  . Diverticular disease of large intestine 12/25/2011  . 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 Symptoms involving urinary system 12/04/2011  .  Medicare annual wellness visit, subsequent 10/01/2011  . Dyslipidemia 10/01/2011  . HLD (hyperlipidemia) 10/01/2011  . Encounter for general adult medical examination without abnormal findings 10/01/2011  . Bladder infection, chronic 09/29/2011  . Incomplete bladder emptying 09/29/2011  . Urge incontinence of urine 09/29/2011  . Delayed onset of urination 09/29/2011  . Hypertension 10/17/2010  .  Hypothyroidism 10/17/2010  . Osteoarthritis 10/17/2010  . Essential (primary) hypertension 10/17/2010  . Arthritis, degenerative 10/17/2010  . CN (constipation) 05/02/2010     Past Surgical History:  Procedure Laterality Date  . ABDOMINAL HYSTERECTOMY  1982  . APPENDECTOMY    . CARDIAC SURGERY     2 failed stents in right leg  . CARPAL TUNNEL RELEASE    . HIP SURGERY  2007   replaced ball in right hip  . left rotator cuff     repair  . ruptured disc    . SPINE SURGERY     steel rod in back  . stent placement leg Right 2015  . TONSILLECTOMY    . TOTAL HIP ARTHROPLASTY     right  . TOTAL KNEE ARTHROPLASTY Left      Prior to Admission medications   Medication Sig Start Date End Date Taking? Authorizing Provider  acetaminophen (TYLENOL) 325 MG tablet Take 325 mg by mouth every 4 (four) hours as needed for mild pain, moderate pain, fever or headache. Reported on 02/27/2015    [provider]  acetaminophen (TYLENOL) 650 MG CR tablet Take 650 mg by mouth every 8 (eight) hours.    [provider]  amLODipine (NORVASC) 5 MG tablet Take 5 mg by mouth daily.    [provider]  apixaban (ELIQUIS) 5 MG TABS tablet Two tablets twice a day for six days then one tablet twice a day afterwards Patient taking differently: Take 5 mg by mouth 2 (two) times daily.  01/27/16   Loletha Grayer, MD  Ascorbic Acid (VITAMIN C) 1000 MG tablet Take 1 tablet (1,000 mg total) by mouth daily. 02/13/16   Zara Council A, PA-C  bisacodyl (DULCOLAX) 10 MG suppository Place 10 mg rectally daily as needed for moderate constipation.    [provider]  cetirizine (ZYRTEC) 10 MG tablet Take 10 mg by mouth daily.    [provider]  Cholecalciferol (VITAMIN D3) 50000 units CAPS Take 50,000 Units by mouth every 30 (thirty) days. Patient takes on the 13th    [provider]  Cranberry 425 MG CAPS Take 425 mg by mouth daily.    [provider]   cyanocobalamin (,VITAMIN B-12,) 1000 MCG/ML injection Inject 1,000 mcg into the muscle every 30 (thirty) days. On the 2nd of each month    [provider]  dextromethorphan-guaiFENesin (ROBITUSSIN-DM) 10-100 MG/5ML liquid Take 10 mLs by mouth every 4 (four) hours as needed for cough.    [provider]  guaiFENesin-dextromethorphan (ROBITUSSIN DM) 100-10 MG/5ML syrup Take 5 mLs by mouth every 6 (six) hours as needed for cough. 08/16/16   Hillary Bow, MD  hydrOXYzine (ATARAX/VISTARIL) 25 MG tablet Take 12.5 mg by mouth 3 (three) times daily as needed for itching.    [provider]  levofloxacin (LEVAQUIN) 500 MG tablet Take 1 tablet (500 mg total) by mouth daily. 08/16/16   Hillary Bow, MD  levothyroxine (SYNTHROID, LEVOTHROID) 25 MCG tablet TAKE 1 TABLET (25 MCG TOTAL) BY MOUTH DAILY. Patient taking differently: Take 25 mcg by mouth daily.  07/04/14   Jackolyn Confer, MD  magnesium hydroxide (  MILK OF MAGNESIA) 400 MG/5ML suspension Take 30 mLs by mouth daily as needed for mild constipation or moderate constipation.    [provider]  menthol-cetylpyridinium (CEPACOL) 3 MG lozenge Take 1 lozenge by mouth as needed for sore throat.    [provider]  metoprolol succinate (TOPROL-XL) 25 MG 24 hr tablet Take 12.5 mg by mouth daily.     [provider]  omeprazole (PRILOSEC) 20 MG capsule Take 1 capsule (20 mg total) by mouth 2 (two) times daily before a meal. 05/15/16   Sudini, Alveta Heimlich, MD  ondansetron (ZOFRAN) 4 MG tablet Take 4 mg by mouth every 8 (eight) hours as needed for nausea or vomiting.    [provider]  phenazopyridine (PYRIDIUM) 100 MG tablet Take 100 mg by mouth 2 (two) times daily as needed for pain.    [provider]  polyethylene glycol (MIRALAX / GLYCOLAX) packet Take 17 g by mouth daily as needed for mild constipation, moderate constipation or severe constipation. Reported on 02/27/2015    [provider]  pravastatin (PRAVACHOL) 40 MG tablet Take 1 tablet (40 mg total) by mouth every evening. 07/04/14   Jackolyn Confer, MD  Probiotic Product (ALIGN) 4 MG CAPS Take 1 capsule by mouth daily.    [provider]  senna (SENOKOT) 8.6 MG TABS tablet Take 2 tablets by mouth daily.    [provider]  sodium chloride (OCEAN) 0.65 % SOLN nasal spray Place 1 spray into both nostrils every 2 (two) hours as needed for congestion.    [provider]  traMADol (ULTRAM) 50 MG tablet Take 0.5 tablets (25 mg total) by mouth every 8 (eight) hours as needed for moderate pain. 01/27/16   Loletha Grayer, MD     Allergies Sulfa antibiotics; Ciprofloxacin; and Penicillins   Family History  Problem Relation Age of Onset  . Hypertension Mother   . Transient ischemic attack Mother   . Kidney disease Neg Hx   . Bladder Cancer Neg Hx   . Prostate cancer Neg Hx     Social History Social History  Substance Use Topics  . Smoking status: Never Smoker  . Smokeless tobacco: Never Used  . Alcohol use No    Review of Systems  Constitutional:   No fever or chills.  ENT:   No sore throat. No rhinorrhea. Cardiovascular:   No chest pain or syncope. Respiratory:   No dyspnea or cough. Gastrointestinal:   Negative for abdominal pain, vomiting and diarrhea.  Musculoskeletal:   Negative for focal pain or swelling All other systems reviewed and are negative except as documented above in ROS and HPI.  ____________________________________________   PHYSICAL EXAM:  VITAL SIGNS: ED Triage Vitals  Enc Vitals Group     BP 08/31/16 0949 131/80     Pulse Rate 08/31/16 0949 91     Resp 08/31/16 0949 17     Temp 08/31/16 0949 97.8 F (36.6 C)     Temp Source 08/31/16 0949 Oral     SpO2 08/31/16 0948 97 %     Weight 08/31/16 0949 100 lb (45.4 kg)     Height 08/31/16 0949 5\' 3"  (1.6 m)     Head Circumference --      Peak Flow --      Pain Score 08/31/16 0954 9     Pain  Loc --      Pain Edu? --      Excl. in [place]? --  Vital signs reviewed, nursing assessments reviewed.   Constitutional:   Alert and oriented. Well appearing and in no distress. Eyes:   No scleral icterus.  EOMI.Marland Kitchen ENT   Head:   Normocephalic and atraumatic.    Neck:   No meningismus. Full ROM Musculoskeletal:   Normal range of motion in all extremities. No joint effusions. No bony tenderness or deformity.  No edema. Neurologic:   Normal speech and language.  Motor grossly intact. No gross focal neurologic deficits are appreciated.  Skin:    Skin is warm, dry with a triangular flap shaped skin tear about 3 cm in length on the medial distal left leg. Does not extend through the dermis, there is a pinpoint area of bleeding at the edge of the wound, slow ooze, not pulsatile. No bleeding deeper down from the wound. No foreign bodies. ____________________________________________    LABS (pertinent positives/negatives) (all labs ordered are listed, but only abnormal results are displayed) Labs Reviewed - No data to display ____________________________________________   EKG    ____________________________________________    RADIOLOGY  No results found.  ____________________________________________   PROCEDURES .Marland KitchenLaceration Repair Date/Time: 08/31/2016 10:09 AM Performed by: Carrie Mew Authorized by: Carrie Mew   Consent:    Consent obtained:  Verbal   Consent given by:  Patient   Alternatives discussed:  No treatment Anesthesia (see MAR for exact dosages):    Anesthesia method:  None Laceration details:    Location:  Leg   Length (cm):  3   Depth (mm):  2 Repair type:    Repair type:  Simple Exploration:    Hemostasis achieved with:  Cautery   Contaminated: no   Treatment:    Visualized foreign bodies/material removed: no   Skin repair:    Repair method:  Steri-Strips Approximation:    Approximation:  Close   [place] border:  well-aligned   Post-procedure details:    Dressing:  Adhesive bandage   Patient tolerance of procedure:  Tolerated well, no immediate complications    ____________________________________________   INITIAL IMPRESSION / ASSESSMENT AND PLAN / ED COURSE  Pertinent labs & imaging results that were available during my care of the patient were reviewed by me and considered in my medical decision making (see chart for details).  Patient presents with skin tear with persistent bleeding from the wound edge. Attempted to cauterize this which improved hemostasis but there was still some oozing. Therefore, Steri-Strips were used extensively to realign the skin flap and completely cover the wound to allow for tamponade.  Dressing with Coban was then applied. Persistent bleeding is due to the Eliquis, there is no deeper wound. The patient's skin is too fragile to allow for sutures. Follow-up with primary care.      ____________________________________________   FINAL CLINICAL IMPRESSION(S) / ED DIAGNOSES  Final diagnoses:  Noninfected skin tear of left lower extremity, initial encounter      New Prescriptions   No medications on file     Portions of this note were generated with dragon dictation software. Dictation errors may occur despite best attempts at proofreading.    Carrie Mew, MD 08/31/16 1012

## 2016-08-31 NOTE — ED Notes (Addendum)
MD cauterizing left leg and steri strips applied

## 2016-09-01 DIAGNOSIS — R2681 Unsteadiness on feet: Secondary | ICD-10-CM | POA: Diagnosis not present

## 2016-09-01 DIAGNOSIS — M6281 Muscle weakness (generalized): Secondary | ICD-10-CM | POA: Diagnosis not present

## 2016-09-02 DIAGNOSIS — R2681 Unsteadiness on feet: Secondary | ICD-10-CM | POA: Diagnosis not present

## 2016-09-02 DIAGNOSIS — M6281 Muscle weakness (generalized): Secondary | ICD-10-CM | POA: Diagnosis not present

## 2016-09-04 DIAGNOSIS — R2681 Unsteadiness on feet: Secondary | ICD-10-CM | POA: Diagnosis not present

## 2016-09-04 DIAGNOSIS — M6281 Muscle weakness (generalized): Secondary | ICD-10-CM | POA: Diagnosis not present

## 2016-09-05 DIAGNOSIS — R2681 Unsteadiness on feet: Secondary | ICD-10-CM | POA: Diagnosis not present

## 2016-09-05 DIAGNOSIS — M6281 Muscle weakness (generalized): Secondary | ICD-10-CM | POA: Diagnosis not present

## 2016-09-08 DIAGNOSIS — R2681 Unsteadiness on feet: Secondary | ICD-10-CM | POA: Diagnosis not present

## 2016-09-08 DIAGNOSIS — M6281 Muscle weakness (generalized): Secondary | ICD-10-CM | POA: Diagnosis not present

## 2016-09-08 DIAGNOSIS — J189 Pneumonia, unspecified organism: Secondary | ICD-10-CM | POA: Diagnosis not present

## 2016-09-09 DIAGNOSIS — R2681 Unsteadiness on feet: Secondary | ICD-10-CM | POA: Diagnosis not present

## 2016-09-09 DIAGNOSIS — M6281 Muscle weakness (generalized): Secondary | ICD-10-CM | POA: Diagnosis not present

## 2016-09-10 DIAGNOSIS — E039 Hypothyroidism, unspecified: Secondary | ICD-10-CM | POA: Diagnosis not present

## 2016-09-10 DIAGNOSIS — I2601 Septic pulmonary embolism with acute cor pulmonale: Secondary | ICD-10-CM | POA: Diagnosis not present

## 2016-09-10 DIAGNOSIS — K219 Gastro-esophageal reflux disease without esophagitis: Secondary | ICD-10-CM | POA: Diagnosis not present

## 2016-09-10 DIAGNOSIS — I10 Essential (primary) hypertension: Secondary | ICD-10-CM | POA: Diagnosis not present

## 2016-09-10 DIAGNOSIS — I6523 Occlusion and stenosis of bilateral carotid arteries: Secondary | ICD-10-CM | POA: Diagnosis not present

## 2016-09-10 DIAGNOSIS — I350 Nonrheumatic aortic (valve) stenosis: Secondary | ICD-10-CM | POA: Diagnosis not present

## 2016-09-10 DIAGNOSIS — G9009 Other idiopathic peripheral autonomic neuropathy: Secondary | ICD-10-CM | POA: Diagnosis not present

## 2016-09-10 DIAGNOSIS — I739 Peripheral vascular disease, unspecified: Secondary | ICD-10-CM | POA: Diagnosis not present

## 2016-09-10 DIAGNOSIS — E785 Hyperlipidemia, unspecified: Secondary | ICD-10-CM | POA: Diagnosis not present

## 2016-09-10 DIAGNOSIS — G3184 Mild cognitive impairment, so stated: Secondary | ICD-10-CM | POA: Diagnosis not present

## 2016-09-12 DIAGNOSIS — R2681 Unsteadiness on feet: Secondary | ICD-10-CM | POA: Diagnosis not present

## 2016-09-12 DIAGNOSIS — M6281 Muscle weakness (generalized): Secondary | ICD-10-CM | POA: Diagnosis not present

## 2016-09-15 DIAGNOSIS — M6281 Muscle weakness (generalized): Secondary | ICD-10-CM | POA: Diagnosis not present

## 2016-09-15 DIAGNOSIS — R2681 Unsteadiness on feet: Secondary | ICD-10-CM | POA: Diagnosis not present

## 2016-10-20 DIAGNOSIS — I83022 Varicose veins of left lower extremity with ulcer of calf: Secondary | ICD-10-CM | POA: Diagnosis not present

## 2016-10-30 DIAGNOSIS — I8310 Varicose veins of unspecified lower extremity with inflammation: Secondary | ICD-10-CM | POA: Diagnosis not present

## 2016-10-30 DIAGNOSIS — L98499 Non-pressure chronic ulcer of skin of other sites with unspecified severity: Secondary | ICD-10-CM | POA: Diagnosis not present

## 2016-10-30 DIAGNOSIS — T1490XA Injury, unspecified, initial encounter: Secondary | ICD-10-CM | POA: Diagnosis not present

## 2016-11-03 DIAGNOSIS — I2699 Other pulmonary embolism without acute cor pulmonale: Secondary | ICD-10-CM | POA: Diagnosis not present

## 2016-11-03 DIAGNOSIS — G3184 Mild cognitive impairment, so stated: Secondary | ICD-10-CM | POA: Diagnosis not present

## 2016-11-03 DIAGNOSIS — I739 Peripheral vascular disease, unspecified: Secondary | ICD-10-CM | POA: Diagnosis not present

## 2016-11-03 DIAGNOSIS — G609 Hereditary and idiopathic neuropathy, unspecified: Secondary | ICD-10-CM | POA: Diagnosis not present

## 2016-11-03 DIAGNOSIS — G451 Carotid artery syndrome (hemispheric): Secondary | ICD-10-CM | POA: Diagnosis not present

## 2016-11-13 DIAGNOSIS — T148XXA Other injury of unspecified body region, initial encounter: Secondary | ICD-10-CM | POA: Diagnosis not present

## 2016-11-13 DIAGNOSIS — R234 Changes in skin texture: Secondary | ICD-10-CM | POA: Diagnosis not present

## 2016-11-13 DIAGNOSIS — L97221 Non-pressure chronic ulcer of left calf limited to breakdown of skin: Secondary | ICD-10-CM | POA: Diagnosis not present

## 2016-11-13 DIAGNOSIS — L98499 Non-pressure chronic ulcer of skin of other sites with unspecified severity: Secondary | ICD-10-CM | POA: Diagnosis not present

## 2016-11-14 DIAGNOSIS — G909 Disorder of the autonomic nervous system, unspecified: Secondary | ICD-10-CM | POA: Diagnosis not present

## 2016-11-24 DIAGNOSIS — E785 Hyperlipidemia, unspecified: Secondary | ICD-10-CM | POA: Diagnosis not present

## 2017-01-09 DIAGNOSIS — I6523 Occlusion and stenosis of bilateral carotid arteries: Secondary | ICD-10-CM | POA: Diagnosis not present

## 2017-01-09 DIAGNOSIS — I350 Nonrheumatic aortic (valve) stenosis: Secondary | ICD-10-CM | POA: Diagnosis not present

## 2017-01-09 DIAGNOSIS — G9009 Other idiopathic peripheral autonomic neuropathy: Secondary | ICD-10-CM | POA: Diagnosis not present

## 2017-01-09 DIAGNOSIS — I10 Essential (primary) hypertension: Secondary | ICD-10-CM | POA: Diagnosis not present

## 2017-01-09 DIAGNOSIS — E039 Hypothyroidism, unspecified: Secondary | ICD-10-CM | POA: Diagnosis not present

## 2017-01-09 DIAGNOSIS — R300 Dysuria: Secondary | ICD-10-CM | POA: Diagnosis not present

## 2017-01-09 DIAGNOSIS — I739 Peripheral vascular disease, unspecified: Secondary | ICD-10-CM | POA: Diagnosis not present

## 2017-01-09 DIAGNOSIS — E785 Hyperlipidemia, unspecified: Secondary | ICD-10-CM | POA: Diagnosis not present

## 2017-01-09 DIAGNOSIS — I2690 Septic pulmonary embolism without acute cor pulmonale: Secondary | ICD-10-CM | POA: Diagnosis not present

## 2017-01-09 DIAGNOSIS — K219 Gastro-esophageal reflux disease without esophagitis: Secondary | ICD-10-CM | POA: Diagnosis not present

## 2017-01-12 DIAGNOSIS — M25572 Pain in left ankle and joints of left foot: Secondary | ICD-10-CM | POA: Diagnosis not present

## 2017-02-03 NOTE — Progress Notes (Deleted)
02/04/2017 8:53 PM   Krista Ellis [birth year] [id number]  Referring provider: Venia Carbon, MD [place], [name] [address]  No chief complaint on file.   HPI: Patient is an 84 -year-old Caucasian female with a history of hematuria, atrophic vaginitis, recurrent UTI's and nephrolithiasis who presents today for a yearly office visit with her daughter, [name].    History of hematuria Patient had a CT renal stone study on 12/28/2014 which noted a 4 mm nonobstructing left renal calculus. No evidence of ureteral calculi, hydronephrosis or other acute findings. She then had a CT scan of the abdomen and pelvis with contrast on 02/26/2015 which again identified the nonobstructing stone in the left kidney and bilateral renal cysts. The patient underwent cystoscopy which was normal to vision was somewhat limited by concurrent use of uribel.    Vaginal atrophy She was seen in the ED for a pulmonary emboism and the vaginal estrogen cream had to be discontinued.    Nephrolithiasis 4 mm nonobstructing left renal calculus on CT renal stone study on 12/28/2014    PMH: Past Medical History:  Diagnosis Date  . Arthritis   . Bleeding disorder ([place])   . Chronic cystitis   . Collagen vascular disease ([place])   . CVA (cerebral infarction) 2003  . Dysuria   . Gross hematuria   . Hematuria   . Hemihypertrophy   . History of kidney stones   . Hyperlipidemia   . Hypertension   . Murmur, cardiac   . Myocardial infarction ([place])   . Peripheral vascular disease ([place])    s/p stent right leg  . Pulmonary nodule    stable on Chest CT March 2014, [facility] at [facility]  . Recurrent UTI   . Sensory urge incontinence   . Thyroid disease   . Urinary frequency     Surgical History: Past Surgical History:  Procedure Laterality Date  . ABDOMINAL HYSTERECTOMY  1982  . APPENDECTOMY    . CARDIAC SURGERY     2 failed stents in right leg  . CARPAL TUNNEL RELEASE    . HIP SURGERY   2007   replaced ball in right hip  . left rotator cuff     repair  . ruptured disc    . SPINE SURGERY     steel rod in back  . stent placement leg Right 2015  . TONSILLECTOMY    . TOTAL HIP ARTHROPLASTY     right  . TOTAL KNEE ARTHROPLASTY Left     Home Medications:  Allergies as of 02/04/2017      Reactions   Sulfa Antibiotics Hives, Itching   Ciprofloxacin Hives, Itching   Penicillins Hives, Itching   .Has patient had a PCN reaction causing immediate rash, facial/tongue/throat swelling, SOB or lightheadedness with hypotension: Unknown Has patient had a PCN reaction causing severe rash involving mucus membranes or skin necrosis: Unknown Has patient had a PCN reaction that required hospitalization: Unknown Has patient had a PCN reaction occurring within the last 10 years: Unknown If all of the above answers are "NO", then may proceed with Cephalosporin use.      Medication List        Accurate as of 02/03/17  8:53 PM. Always use your most recent med list.          acetaminophen 325 MG tablet Commonly known as:  TYLENOL Take 325 mg by mouth every 4 (four) hours as needed for mild pain, moderate pain, fever or  headache. Reported on 02/27/2015   acetaminophen 650 MG CR tablet Commonly known as:  TYLENOL Take 650 mg by mouth every 8 (eight) hours.   ALIGN 4 MG Caps Take 1 capsule by mouth daily.   amLODipine 5 MG tablet Commonly known as:  NORVASC Take 5 mg by mouth daily.   apixaban 5 MG Tabs tablet Commonly known as:  ELIQUIS Two tablets twice a day for six days then one tablet twice a day afterwards   bisacodyl 10 MG suppository Commonly known as:  DULCOLAX Place 10 mg rectally daily as needed for moderate constipation.   cetirizine 10 MG tablet Commonly known as:  ZYRTEC Take 10 mg by mouth daily.   Cranberry 425 MG Caps Take 425 mg by mouth daily.   cyanocobalamin 1000 MCG/ML injection Commonly known as:  (VITAMIN B-12) Inject 1,000 mcg into the muscle  every 30 (thirty) days. On the 2nd of each month   dextromethorphan-guaiFENesin 10-100 MG/5ML liquid Commonly known as:  ROBITUSSIN-DM Take 10 mLs by mouth every 4 (four) hours as needed for cough.   guaiFENesin-dextromethorphan 100-10 MG/5ML syrup Commonly known as:  ROBITUSSIN DM Take 5 mLs by mouth every 6 (six) hours as needed for cough.   hydrOXYzine 25 MG tablet Commonly known as:  ATARAX/VISTARIL Take 12.5 mg by mouth 3 (three) times daily as needed for itching.   levofloxacin 500 MG tablet Commonly known as:  LEVAQUIN Take 1 tablet (500 mg total) by mouth daily.   levothyroxine 25 MCG tablet Commonly known as:  SYNTHROID, LEVOTHROID TAKE 1 TABLET (25 MCG TOTAL) BY MOUTH DAILY.   magnesium hydroxide 400 MG/5ML suspension Commonly known as:  MILK OF MAGNESIA Take 30 mLs by mouth daily as needed for mild constipation or moderate constipation.   menthol-cetylpyridinium 3 MG lozenge Commonly known as:  CEPACOL Take 1 lozenge by mouth as needed for sore throat.   metoprolol succinate 25 MG 24 hr tablet Commonly known as:  TOPROL-XL Take 12.5 mg by mouth daily.   omeprazole 20 MG capsule Commonly known as:  PRILOSEC Take 1 capsule (20 mg total) by mouth 2 (two) times daily before a meal.   ondansetron 4 MG tablet Commonly known as:  ZOFRAN Take 4 mg by mouth every 8 (eight) hours as needed for nausea or vomiting.   phenazopyridine 100 MG tablet Commonly known as:  PYRIDIUM Take 100 mg by mouth 2 (two) times daily as needed for pain.   polyethylene glycol packet Commonly known as:  MIRALAX / GLYCOLAX Take 17 g by mouth daily as needed for mild constipation, moderate constipation or severe constipation. Reported on 02/27/2015   pravastatin 40 MG tablet Commonly known as:  PRAVACHOL Take 1 tablet (40 mg total) by mouth every evening.   senna 8.6 MG Tabs tablet Commonly known as:  SENOKOT Take 2 tablets by mouth daily.   sodium chloride 0.65 % Soln nasal  spray Commonly known as:  OCEAN Place 1 spray into both nostrils every 2 (two) hours as needed for congestion.   traMADol 50 MG tablet Commonly known as:  ULTRAM Take 0.5 tablets (25 mg total) by mouth every 8 (eight) hours as needed for moderate pain.   vitamin C 1000 MG tablet Take 1 tablet (1,000 mg total) by mouth daily.   Vitamin D3 50000 units Caps Take 50,000 Units by mouth every 30 (thirty) days. Patient takes on the 13th       Allergies:  Allergies  Allergen Reactions  . Sulfa Antibiotics Hives  and Itching  . Ciprofloxacin Hives and Itching  . Penicillins Hives and Itching    .Has patient had a PCN reaction causing immediate rash, facial/tongue/throat swelling, SOB or lightheadedness with hypotension: Unknown Has patient had a PCN reaction causing severe rash involving mucus membranes or skin necrosis: Unknown Has patient had a PCN reaction that required hospitalization: Unknown Has patient had a PCN reaction occurring within the last 10 years: Unknown If all of the above answers are "NO", then may proceed with Cephalosporin use.     Family History: Family History  Problem Relation Age of Onset  . Hypertension Mother   . Transient ischemic attack Mother   . Kidney disease Neg Hx   . Bladder Cancer Neg Hx   . Prostate cancer Neg Hx     Social History:  reports that  has never smoked. she has never used smokeless tobacco. She reports that she does not drink alcohol or use drugs.  ROS:                                        Physical Exam: There were no vitals taken for this visit.  Constitutional:  Alert and oriented, No acute distress. HEENT: [place] AT, moist mucus membranes.  Trachea midline, no masses. Cardiovascular: No clubbing, cyanosis, or edema. Respiratory: Normal respiratory effort, no increased work of breathing. GI: Abdomen is soft, nontender, nondistended, no abdominal masses GU: No CVA tenderness.  Skin: No rashes, bruises  or suspicious lesions. Lymph: No cervical or inguinal adenopathy. Neurologic: Grossly intact, no focal deficits, moving all 4 extremities. Psychiatric: Normal mood and affect.  Constitutional: Well nourished. Alert and oriented, No acute distress. HEENT: [place] AT, moist mucus membranes. Trachea midline, no masses. Cardiovascular: No clubbing, cyanosis, or edema. Respiratory: Normal respiratory effort, no increased work of breathing. GI: Abdomen is soft, non tender, non distended, no abdominal masses. Liver and spleen not palpable.  No hernias appreciated.  Stool sample for occult testing is not indicated.   GU: No CVA tenderness.  No bladder fullness or masses.   Skin: No rashes, bruises or suspicious lesions. Lymph: No cervical or inguinal adenopathy. Neurologic: Grossly intact, no focal deficits, moving all 4 extremities. Psychiatric: Normal mood and affect.   Laboratory Data: Lab Results  Component Value Date   WBC 8.1 08/16/2016   HGB 11.6 (L) 08/16/2016   HCT 34.5 (L) 08/16/2016   MCV 94.5 08/16/2016   PLT 273 08/16/2016    Lab Results  Component Value Date   CREATININE 0.76 08/15/2016    Lab Results  Component Value Date   HGBA1C 5.3 05/13/2016     Pertinent Imaging: Results for LAMEES, GABLE (MRN [id number]) as of 02/13/2016 10:02  Ref. Range 02/13/2016 09:38  Scan Result Unknown [id number]    Assessment & Plan:    1. Microscopic hematuria  -negative workup except for nonobstructing 4 mm left renal stone.  2. Dysuria  - not occurring at this time  3. Atrophic vaginitis  - no longer a candidate for vaginal estrogen cream due to PE  4. Recurrent UTI   - reviewed UTI prevention strategies  -  recommend only treating symptomatic urinary tract infections and recommend against treating asymptomatic bacteriuria  -  patient instructed to present to the office for a CATH urine sample if she feels like she is getting a urinary tract infection  - discontinue the  oxybutynin  5. Nephrolithiasis  - 4 mm left nonobstructing renal calculus. No further workup needed.  No Follow-up on file.  Zara Council, [facility] [address], [place], [place] [address] [phone]

## 2017-02-04 ENCOUNTER — Encounter: Payer: Self-pay | Admitting: Urology

## 2017-02-04 ENCOUNTER — Ambulatory Visit: Payer: Self-pay | Admitting: Urology

## 2017-02-12 ENCOUNTER — Ambulatory Visit: Payer: Self-pay | Admitting: Urology

## 2017-02-19 DIAGNOSIS — L03112 Cellulitis of left axilla: Secondary | ICD-10-CM

## 2017-03-02 DIAGNOSIS — J029 Acute pharyngitis, unspecified: Secondary | ICD-10-CM | POA: Diagnosis not present

## 2017-03-10 DIAGNOSIS — I2699 Other pulmonary embolism without acute cor pulmonale: Secondary | ICD-10-CM | POA: Diagnosis not present

## 2017-03-10 DIAGNOSIS — I6523 Occlusion and stenosis of bilateral carotid arteries: Secondary | ICD-10-CM | POA: Diagnosis not present

## 2017-03-10 DIAGNOSIS — G9009 Other idiopathic peripheral autonomic neuropathy: Secondary | ICD-10-CM

## 2017-03-10 DIAGNOSIS — I739 Peripheral vascular disease, unspecified: Secondary | ICD-10-CM | POA: Diagnosis not present

## 2017-03-10 DIAGNOSIS — K219 Gastro-esophageal reflux disease without esophagitis: Secondary | ICD-10-CM | POA: Diagnosis not present

## 2017-03-10 DIAGNOSIS — G3184 Mild cognitive impairment, so stated: Secondary | ICD-10-CM | POA: Diagnosis not present

## 2017-04-13 DIAGNOSIS — N390 Urinary tract infection, site not specified: Secondary | ICD-10-CM | POA: Diagnosis not present

## 2017-04-16 ENCOUNTER — Other Ambulatory Visit: Payer: Self-pay

## 2017-04-16 ENCOUNTER — Telehealth: Payer: Self-pay

## 2017-04-16 ENCOUNTER — Emergency Department
Admission: EM | Admit: 2017-04-16 | Discharge: 2017-04-16 | Disposition: A | Discharge status: Skilled Nursing Facility | Payer: Medicare Other | Attending: Emergency Medicine | Admitting: Emergency Medicine

## 2017-04-16 ENCOUNTER — Encounter: Payer: Self-pay | Admitting: Emergency Medicine

## 2017-04-16 ENCOUNTER — Emergency Department: Payer: Medicare Other

## 2017-04-16 DIAGNOSIS — R079 Chest pain, unspecified: Secondary | ICD-10-CM

## 2017-04-16 DIAGNOSIS — I2510 Atherosclerotic heart disease of native coronary artery without angina pectoris: Secondary | ICD-10-CM | POA: Insufficient documentation

## 2017-04-16 DIAGNOSIS — Z7901 Long term (current) use of anticoagulants: Secondary | ICD-10-CM | POA: Diagnosis not present

## 2017-04-16 DIAGNOSIS — R1013 Epigastric pain: Secondary | ICD-10-CM

## 2017-04-16 DIAGNOSIS — E119 Type 2 diabetes mellitus without complications: Secondary | ICD-10-CM | POA: Diagnosis not present

## 2017-04-16 DIAGNOSIS — E039 Hypothyroidism, unspecified: Secondary | ICD-10-CM | POA: Diagnosis not present

## 2017-04-16 DIAGNOSIS — Z96652 Presence of left artificial knee joint: Secondary | ICD-10-CM | POA: Insufficient documentation

## 2017-04-16 DIAGNOSIS — K279 Peptic ulcer, site unspecified, unspecified as acute or chronic, without hemorrhage or perforation: Secondary | ICD-10-CM | POA: Diagnosis not present

## 2017-04-16 DIAGNOSIS — R0602 Shortness of breath: Secondary | ICD-10-CM | POA: Diagnosis not present

## 2017-04-16 DIAGNOSIS — I252 Old myocardial infarction: Secondary | ICD-10-CM | POA: Insufficient documentation

## 2017-04-16 DIAGNOSIS — Z96641 Presence of right artificial hip joint: Secondary | ICD-10-CM | POA: Insufficient documentation

## 2017-04-16 DIAGNOSIS — Z7401 Bed confinement status: Secondary | ICD-10-CM | POA: Diagnosis not present

## 2017-04-16 DIAGNOSIS — F0390 Unspecified dementia without behavioral disturbance: Secondary | ICD-10-CM | POA: Diagnosis not present

## 2017-04-16 DIAGNOSIS — R0789 Other chest pain: Secondary | ICD-10-CM | POA: Diagnosis not present

## 2017-04-16 DIAGNOSIS — Z79899 Other long term (current) drug therapy: Secondary | ICD-10-CM | POA: Insufficient documentation

## 2017-04-16 DIAGNOSIS — I10 Essential (primary) hypertension: Secondary | ICD-10-CM | POA: Diagnosis not present

## 2017-04-16 DIAGNOSIS — K210 Gastro-esophageal reflux disease with esophagitis: Secondary | ICD-10-CM | POA: Diagnosis not present

## 2017-04-16 DIAGNOSIS — K219 Gastro-esophageal reflux disease without esophagitis: Secondary | ICD-10-CM

## 2017-04-16 DIAGNOSIS — M6281 Muscle weakness (generalized): Secondary | ICD-10-CM | POA: Diagnosis not present

## 2017-04-16 LAB — CBC
HCT: 42.8 % (ref 35.0–47.0)
Hemoglobin: 14.1 g/dL (ref 12.0–16.0)
MCH: 30.9 pg (ref 26.0–34.0)
MCHC: 32.9 g/dL (ref 32.0–36.0)
MCV: 93.9 fL (ref 80.0–100.0)
Platelets: 302 10*3/uL (ref 150–440)
RBC: 4.56 MIL/uL (ref 3.80–5.20)
RDW: 12.5 % (ref 11.5–14.5)
WBC: 13.5 10*3/uL — AB (ref 3.6–11.0)

## 2017-04-16 LAB — BASIC METABOLIC PANEL
Anion gap: 9 (ref 5–15)
BUN: 20 mg/dL (ref 6–20)
CHLORIDE: 106 mmol/L (ref 101–111)
CO2: 26 mmol/L (ref 22–32)
Calcium: 9.6 mg/dL (ref 8.9–10.3)
Creatinine, Ser: 0.71 mg/dL (ref 0.44–1.00)
GFR calc Af Amer: >60 mL/min (ref >60)
GFR calc non Af Amer: >60 mL/min (ref >60)
Glucose, Bld: 120 mg/dL — ABNORMAL HIGH (ref 65–99)
Potassium: 4.1 mmol/L (ref 3.5–5.1)
SODIUM: 141 mmol/L (ref 135–145)

## 2017-04-16 LAB — HEPATIC FUNCTION PANEL
ALBUMIN: 3.7 g/dL (ref 3.5–5.0)
ALK PHOS: 51 U/L (ref 38–126)
ALT: 18 U/L (ref 14–54)
AST: 32 U/L (ref 15–41)
BILIRUBIN TOTAL: 0.5 mg/dL (ref 0.3–1.2)
Bilirubin, Direct: <0.1 mg/dL — ABNORMAL LOW (ref 0.1–0.5)
Total Protein: 7.3 g/dL (ref 6.5–8.1)

## 2017-04-16 LAB — LIPASE, BLOOD: Lipase: 28 U/L (ref 11–51)

## 2017-04-16 LAB — TROPONIN I
Troponin I: <0.03 ng/mL (ref <0.03)
Troponin I: <0.03 ng/mL (ref <0.03)

## 2017-04-16 MED ORDER — ASPIRIN 81 MG PO CHEW
324.0000 mg | CHEWABLE_TABLET | Freq: Once | ORAL | Status: DC
  Filled 2017-04-16: qty 4

## 2017-04-16 MED ORDER — NITROGLYCERIN 2 % TD OINT
0.5000 [in_us] | TOPICAL_OINTMENT | Freq: Once | TRANSDERMAL | Status: DC
  Filled 2017-04-16: qty 1

## 2017-04-16 MED ORDER — SUCRALFATE 1 GM/10ML PO SUSP: 1.0000 g | Freq: Four times a day (QID) | ORAL | 0 refills | Status: DC

## 2017-04-16 NOTE — ED Provider Notes (Addendum)
[facility] Emergency Department Provider Note   ____________________________________________   First MD Initiated Contact with Patient 04/16/17 [phone]     (approximate)  I have reviewed the triage vital signs and the nursing notes.   HISTORY  Chief Complaint Chest Pain    HPI Krista Ellis is a 84 y.o. female brought to the ED from [facility] via EMS with a chief complaint of chest pain.  Patient has a history of CAD status post MI who was awakened in the middle of the night with central chest pressure.  Nonradiating, not associated with diaphoresis, nausea/vomiting, palpitations or dizziness.  Patient does complain of mild shortness of breath.  Denies recent fever, chills, cough, congestion, abdominal pain, dysuria, diarrhea.  Denies recent travel or trauma.   Past Medical History:  Diagnosis Date  . Arthritis   . Bleeding disorder ([place])   . Chronic cystitis   . Collagen vascular disease ([place])   . CVA (cerebral infarction) 2003  . Dysuria   . Gross hematuria   . Hematuria   . Hemihypertrophy   . History of kidney stones   . Hyperlipidemia   . Hypertension   . Murmur, cardiac   . Myocardial infarction ([place])   . Peripheral vascular disease ([place])    s/p stent right leg  . Pulmonary nodule    stable on Chest CT March 2014, Followed at [facility]  . Recurrent UTI   . Sensory urge incontinence   . Thyroid disease   . Urinary frequency     Patient Active Problem List   Diagnosis Date Noted  . Pneumonia 08/14/2016  . Aspiration pneumonia of both lower lobes due to gastric secretions ([place]) 05/15/2016  . Chest pain 05/13/2016  . Acid reflux 04/13/2015  . Combined fat and carbohydrate induced hyperlipemia 04/13/2015  . Dysuria 03/01/2015  . Atrophic vaginitis 03/01/2015  . Microscopic hematuria 03/01/2015  . Bladder spasms 03/01/2015  . Pulmonary embolism ([place]) 09/22/2014  . UTI (urinary tract infection) 09/14/2014  . Enterococcus UTI  09/14/2014  . Generalized weakness 09/14/2014  . Sepsis ([place]) 09/10/2014  . Open wound of knee, leg (except thigh), and ankle, complicated 96/04/5407  . Buttock pain 07/19/2014  . Hospital discharge follow-up 07/04/2014  . Facial droop   . Malnutrition of moderate degree ([place]) 05/21/2014  . Seizures ([place])   . TIA (transient ischemic attack) 05/18/2014  . Systolic murmur 81/19/1478  . Gait disturbance 05/15/2014  . Benign essential HTN 05/08/2014  . History of subdural hematoma   . Subdural hematoma ([place]) 05/05/2014  . B12 deficiency 03/09/2014  . Bradycardia 02/24/2014  . Arteriosclerosis of coronary artery 02/23/2014  . Recurrent UTI 01/03/2014  . Bilateral hand numbness 01/03/2014  . Nephrolithiasis 11/18/2013  . Recurrent falls 11/18/2013  . Closed rib fracture 09/25/2013  . Abnormal CT scan, chest 09/01/2013  . Chest wall pain 08/31/2013  . Hematoma and contusion 08/31/2013  . Atypical chest pain 06/17/2013  . Peripheral vascular disease ([place]) 06/17/2013  . Heart valve disease 06/17/2013  . Atherosclerotic peripheral vascular disease ([place]) 06/01/2013  . Bladder wall hemorrhage 12/10/2012  . Dysphagia, pharyngoesophageal phase 12/10/2012  . Malaise and fatigue 12/10/2012  . Frank hematuria 12/02/2012  . Bladder retention 12/02/2012  . Postmenopausal estrogen deficiency 11/30/2012  . Glucose found in urine on examination 11/30/2012  . Post menopausal syndrome 11/30/2012  . Type 2 diabetes mellitus with hyperglycemia ([place]) 11/24/2012  . Lung nodule, solitary 03/12/2012  . Diverticular disease of large intestine 12/25/2011  .  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 Symptoms involving urinary system 12/04/2011  . Medicare annual wellness visit, subsequent 10/01/2011  . Dyslipidemia 10/01/2011  . HLD (hyperlipidemia) 10/01/2011  . Encounter for general adult medical examination without abnormal findings 10/01/2011  . Bladder infection, chronic  09/29/2011  . Incomplete bladder emptying 09/29/2011  . Urge incontinence of urine 09/29/2011  . Delayed onset of urination 09/29/2011  . Hypertension 10/17/2010  . Hypothyroidism 10/17/2010  . Osteoarthritis 10/17/2010  . Essential (primary) hypertension 10/17/2010  . Arthritis, degenerative 10/17/2010  . CN (constipation) 05/02/2010    Past Surgical History:  Procedure Laterality Date  . ABDOMINAL HYSTERECTOMY  1982  . APPENDECTOMY    . CARDIAC SURGERY     2 failed stents in right leg  . CARPAL TUNNEL RELEASE    . HIP SURGERY  2007   replaced ball in right hip  . left rotator cuff     repair  . ruptured disc    . SPINE SURGERY     steel rod in back  . stent placement leg Right 2015  . TONSILLECTOMY    . TOTAL HIP ARTHROPLASTY     right  . TOTAL KNEE ARTHROPLASTY Left     Prior to Admission medications   Medication Sig Start Date End Date Taking? Authorizing Provider  acetaminophen (TYLENOL) 325 MG tablet Take 325 mg by mouth every 4 (four) hours as needed for mild pain, moderate pain, fever or headache. Reported on 02/27/2015    [provider]  acetaminophen (TYLENOL) 650 MG CR tablet Take 650 mg by mouth every 8 (eight) hours.    [provider]  amLODipine (NORVASC) 5 MG tablet Take 5 mg by mouth daily.    [provider]  apixaban (ELIQUIS) 5 MG TABS tablet Two tablets twice a day for six days then one tablet twice a day afterwards Patient taking differently: Take 5 mg by mouth 2 (two) times daily.  01/27/16   Loletha Grayer, MD  Ascorbic Acid (VITAMIN C) 1000 MG tablet Take 1 tablet (1,000 mg total) by mouth daily. 02/13/16   Zara Council A, PA-C  bisacodyl (DULCOLAX) 10 MG suppository Place 10 mg rectally daily as needed for moderate constipation.    [provider]  cetirizine (ZYRTEC) 10 MG tablet Take 10 mg by mouth daily.    [provider]  Cholecalciferol (VITAMIN D3) 50000 units CAPS Take 50,000 Units by mouth  every 30 (thirty) days. Patient takes on the 13th    [provider]  Cranberry 425 MG CAPS Take 425 mg by mouth daily.    [provider]  cyanocobalamin (,VITAMIN B-12,) 1000 MCG/ML injection Inject 1,000 mcg into the muscle every 30 (thirty) days. On the 2nd of each month    [provider]  dextromethorphan-guaiFENesin (ROBITUSSIN-DM) 10-100 MG/5ML liquid Take 10 mLs by mouth every 4 (four) hours as needed for cough.    [provider]  guaiFENesin-dextromethorphan (ROBITUSSIN DM) 100-10 MG/5ML syrup Take 5 mLs by mouth every 6 (six) hours as needed for cough. 08/16/16   Hillary Bow, MD  hydrOXYzine (ATARAX/VISTARIL) 25 MG tablet Take 12.5 mg by mouth 3 (three) times daily as needed for itching.    [provider]  levofloxacin (LEVAQUIN) 500 MG tablet Take 1 tablet (500 mg total) by mouth daily. 08/16/16   Hillary Bow, MD  levothyroxine (SYNTHROID, LEVOTHROID) 25 MCG tablet TAKE 1 TABLET (25 MCG TOTAL) BY MOUTH DAILY. Patient taking differently: Take 25 mcg  by mouth daily.  07/04/14   Jackolyn Confer, MD  magnesium hydroxide (MILK OF MAGNESIA) 400 MG/5ML suspension Take 30 mLs by mouth daily as needed for mild constipation or moderate constipation.    [provider]  menthol-cetylpyridinium (CEPACOL) 3 MG lozenge Take 1 lozenge by mouth as needed for sore throat.    [provider]  metoprolol succinate (TOPROL-XL) 25 MG 24 hr tablet Take 12.5 mg by mouth daily.     [provider]  omeprazole (PRILOSEC) 20 MG capsule Take 1 capsule (20 mg total) by mouth 2 (two) times daily before a meal. 05/15/16   Sudini, Alveta Heimlich, MD  ondansetron (ZOFRAN) 4 MG tablet Take 4 mg by mouth every 8 (eight) hours as needed for nausea or vomiting.    [provider]  phenazopyridine (PYRIDIUM) 100 MG tablet Take 100 mg by mouth 2 (two) times daily as needed for pain.    [provider]  polyethylene glycol (MIRALAX /  GLYCOLAX) packet Take 17 g by mouth daily as needed for mild constipation, moderate constipation or severe constipation. Reported on 02/27/2015    [provider]  pravastatin (PRAVACHOL) 40 MG tablet Take 1 tablet (40 mg total) by mouth every evening. 07/04/14   Jackolyn Confer, MD  Probiotic Product (ALIGN) 4 MG CAPS Take 1 capsule by mouth daily.    [provider]  senna (SENOKOT) 8.6 MG TABS tablet Take 2 tablets by mouth daily.    [provider]  sodium chloride (OCEAN) 0.65 % SOLN nasal spray Place 1 spray into both nostrils every 2 (two) hours as needed for congestion.    [provider]  sucralfate (CARAFATE) 1 GM/10ML suspension Take 10 mLs (1 g total) by mouth 4 (four) times daily. 04/16/17   Paulette Blanch, MD  traMADol (ULTRAM) 50 MG tablet Take 0.5 tablets (25 mg total) by mouth every 8 (eight) hours as needed for moderate pain. 01/27/16   Loletha Grayer, MD    Allergies Sulfa antibiotics; Ciprofloxacin; and Penicillins  Family History  Problem Relation Age of Onset  . Hypertension Mother   . Transient ischemic attack Mother   . Kidney disease Neg Hx   . Bladder Cancer Neg Hx   . Prostate cancer Neg Hx     Social History Social History   Tobacco Use  . Smoking status: Never Smoker  . Smokeless tobacco: Never Used  Substance Use Topics  . Alcohol use: No  . Drug use: No    Review of Systems  Constitutional: No fever/chills. Eyes: No visual changes. ENT: No sore throat. Cardiovascular: Positive for chest pain. Respiratory: Denies shortness of breath. Gastrointestinal: No abdominal pain.  No nausea, no vomiting.  No diarrhea.  No constipation. Genitourinary: Negative for dysuria. Musculoskeletal: Negative for back pain. Skin: Negative for rash. Neurological: Negative for headaches, focal weakness or numbness.   ____________________________________________   PHYSICAL EXAM:  VITAL SIGNS: ED Triage Vitals  Enc Vitals Group      BP --      Pulse Rate 04/16/17 0337 (!) 109     Resp 04/16/17 0337 (!) 27     Temp 04/16/17 0337 98.1 F (36.7 C)     Temp Source 04/16/17 0337 Oral     SpO2 04/16/17 0337 94 %     Weight 04/16/17 0338 104 lb (47.2 kg)     Height 04/16/17 0338 5\' 3"  (1.6 m)     Head Circumference --      Peak  Flow --      Pain Score 04/16/17 0338 5     Pain Loc --      Pain Edu? --      Excl. in [place]? --     Constitutional: Alert and oriented. Well appearing and in no acute distress. Eyes: Conjunctivae are normal. PERRL. EOMI. Head: Atraumatic. Nose: No congestion/rhinnorhea. Mouth/Throat: Mucous membranes are moist.  Oropharynx non-erythematous. Neck: No stridor.   Cardiovascular: Normal rate, regular rhythm. Grossly normal heart sounds.  Good peripheral circulation. Respiratory: Increased respiratory effort.  No retractions. Lungs CTAB. Gastrointestinal: Soft and nontender. No distention. No abdominal bruits. No CVA tenderness. Musculoskeletal: No lower extremity tenderness nor edema.  No joint effusions. Neurologic:  Normal speech and language. No gross focal neurologic deficits are appreciated.  Skin:  Skin is warm, dry and intact. No rash noted. Psychiatric: Mood and affect are normal. Speech and behavior are normal.  ____________________________________________   LABS (all labs ordered are listed, but only abnormal results are displayed)  Labs Reviewed  BASIC METABOLIC PANEL - Abnormal; Notable for the following components:      Result Value   Glucose, Bld 120 (*)    All other components within normal limits  CBC - Abnormal; Notable for the following components:   WBC 13.5 (*)    All other components within normal limits  HEPATIC FUNCTION PANEL - Abnormal; Notable for the following components:   Bilirubin, Direct <0.1 (*)    All other components within normal limits  TROPONIN I  LIPASE, BLOOD  TROPONIN I   ____________________________________________  EKG  ED ECG  REPORT I, Cem Kosman J, the attending physician, personally viewed and interpreted this ECG.   Date: 04/16/2017  EKG Time: 0340  Rate: 107  Rhythm: sinus tachycardia  Axis: Normal  Intervals:nonspecific intraventricular conduction delay  ST&T Change: Nonspecific  ____________________________________________  RADIOLOGY  ED MD interpretation: No acute cardiopulmonary process  Official radiology report(s): Dg Chest 2 View  Result Date: 04/16/2017 CLINICAL DATA:  84 year old female with chest pain. EXAM: CHEST - 2 VIEW COMPARISON:  Chest radiograph dated 08/14/2016 FINDINGS: Diffuse interstitial coarsening. No focal consolidation, pleural effusion, or pneumothorax. The cardiac silhouette is within normal limits. There is osteopenia with degenerative changes of the spine and shoulders. No acute osseous pathology. IMPRESSION: No active cardiopulmonary disease. Electronically Signed   By: Anner Crete M.D.   On: 04/16/2017 04:17    ____________________________________________   PROCEDURES  Procedure(s) performed: None  Procedures  Critical Care performed: No  ____________________________________________   INITIAL IMPRESSION / ASSESSMENT AND PLAN / ED COURSE  As part of my medical decision making, I reviewed the following data within the [place] notes reviewed and incorporated, Labs reviewed, Old EKG reviewed, Old chart reviewed, Radiograph reviewed, Discussed with admitting physician and Notes from prior ED visits   84 year old female with a history of CAD status post MI who presents with central chest pressure. Differential diagnosis includes, but is not limited to, ACS, aortic dissection, pulmonary embolism, cardiac tamponade, pneumothorax, pneumonia, pericarditis, myocarditis, GI-related causes including esophagitis/gastritis, and musculoskeletal chest wall pain.    Will obtain screening lab work including troponin, chest x-ray, EKG.  Will  administer 4 baby aspirin, nitroglycerin paste and reassess.  Clinical Course as of Apr 17 803  Thu Apr 16, 2017  0426 Initial troponin unremarkable. Discussed with hospitalist to evaluate patient in the emergency department for admission.   [JS]  [id number] Patient's daughter now at bedside.  Dr. Jerelyn Charles spoke with  patient and her daughter.  PCP has been decreasing patient's Pepcid to every other day.  Normal stress test 1 year ago.  Daughter feels that patient would be better served to return to [facility]  Hospitalist did not feel this was unstable angina necessitating admission; recommends Carafate on discharge.  Will check LFTs and lipase, repeat timed troponin and reassess.   [JS]  [id number] Patient resting no acute distress.  No complaints of pain.  LFTs and lipase were unremarkable.  Repeat troponin has been drawn and result pending.  If troponin remains unremarkable, patient may be able to be discharged back to [facility] with a prescription for Carafate.  I have discussed this with patient and her daughter who are both agreeable with plan of care.  Care will be transferred to the oncoming provider to check repeat troponin.   [JS]  0804 Repeat troponin remains normal.  Patient will be discharged back to [facility] with close follow-up with her PCP.  Strict return precautions given.  Patient and her daughter verbalize understanding and agree with plan of care.   [JS]    Clinical Course User Index [JS] Paulette Blanch, MD     ____________________________________________   FINAL CLINICAL IMPRESSION(S) / ED DIAGNOSES  Final diagnoses:  Chest pain, unspecified type  Gastroesophageal reflux disease, esophagitis presence not specified  Epigastric pain     ED Discharge Orders        Ordered    sucralfate (CARAFATE) 1 GM/10ML suspension  4 times daily     04/16/17 0641       Note:  This document was prepared using Dragon voice recognition software and may include unintentional dictation  errors.    Paulette Blanch, MD 04/16/17 Gloriajean Dell    Paulette Blanch, MD 04/16/17 [phone]

## 2017-04-16 NOTE — Telephone Encounter (Signed)
PLEASE NOTE: All timestamps contained within this report are represented as [place] Standard Time. CONFIDENTIALTY NOTICE: This fax transmission is intended only for the addressee. It contains information that is legally privileged, confidential or otherwise protected from use or disclosure. If you are not the intended recipient, you are strictly prohibited from reviewing, disclosing, copying using or disseminating any of this information or taking any action in reliance on or regarding this information. If you have received this fax in error, please notify us immediately by telephone so that we can arrange for its return to [place]. Phone: [phone], Toll-Free: [phone], Fax: [phone] Page: 1 of 2 Call Id: [id number] Patient Name: Krista Ellis Gender: Female DOB: [birth year] Age: 84 Y 10 M 13 D Return Phone Number: [phone] (Primary) Address: City/State/Zip: [place] Client [place] - Client Client Site [place] Physician Viviana Simpler - MD Contact Type Call Who Is [place] / [place] Call Type Triage / Clinical Caller Name [facility] Relationship To Patient Provider Return Phone Number [phone] (Primary) Facility Name [place] Number [phone] or [phone] Chief Complaint CHEST PAIN (>=21 years) - pain, pressure, heaviness or tightness Reason for Call Symptomatic Patient Initial Comment Caller states patient has chest pain and shortness of breath. Additional Comment Translation No Nurse Assessment Nurse: Yolanda Bonine. RN, Sunday Spillers Date/Time Eilene Ghazi Time): 04/16/2017 2:42:43 AM Confirm and document reason for call. If symptomatic, describe symptoms. ---Caller states that she c/o chest pain, on macrobid for UTI just started today. Ox sat 93 started on 2L, pulse 94 and BP  142/76. also complaining of headache. Does the patient have any new or worsening symptoms? ---Yes Will a triage be completed? ---Yes Related visit to physician within the last 2 weeks? ---Yes Does the PT have any chronic conditions? (i.e. diabetes, asthma, etc.) ---Yes List chronic conditions. ---gasto reflux, htn, uti, Is this a behavioral health or substance abuse call? ---No Guidelines Guideline Title Affirmed Question Affirmed Notes Nurse Date/Time (Eastern Time) Chest Pain [1] Chest pain lasts > 5 minutes AND [2] described as crushing, pressure-like, or heavy Yolanda Bonine. RN, Sunday Spillers 04/16/2017 2:45:12 AM Disp. Time Eilene Ghazi Time) Disposition Final User 04/16/2017 2:40:58 AM Send to Urgent Kathreen Cornfield, [place] NOTE: All timestamps contained within this report are represented as [place] Standard Time. CONFIDENTIALTY NOTICE: This fax transmission is intended only for the addressee. It contains information that is legally privileged, confidential or otherwise protected from use or disclosure. If you are not the intended recipient, you are strictly prohibited from reviewing, disclosing, copying using or disseminating any of this information or taking any action in reliance on or regarding this information. If you have received this fax in error, please notify us immediately by telephone so that we can arrange for its return to [place]. Phone: [phone], Toll-Free: [phone], Fax: [phone] Page: 2 of 2 Call Id: [id number] [place]. Time Eilene Ghazi Time) Disposition Final User 04/16/2017 2:48:30 AM Send To RN Personal Yolanda Bonine. RN, Sunday Spillers 04/16/2017 2:59:11 AM 911 Outcome Documentation Yolanda Bonine. RN, Sunday Spillers Reason: they are calling EMS now. 04/16/2017 2:59:27 AM Call Completed Yolanda Bonine. RN, Sunday Spillers 04/16/2017 2:47:38 AM Call EMS 911 Now Yes Yolanda Bonine. RN, [place] Gave Disagree/Comply Comply Caller Understands Yes PreDisposition Go to ED Care Advice Given Per Guideline CARE ADVICE given per Chest  Pain (Adult) guideline. CALL EMS 911 NOW: Immediate medical attention is needed. You need to hang up and  call 911 (or an ambulance). Psychologist, forensic Discretion: I'll call you back in a few minutes to be sure you were able to reach them.) Referrals GO TO FACILITY UNDECIDED

## 2017-04-16 NOTE — ED Notes (Signed)
Patient transported to X-ray 

## 2017-04-16 NOTE — ED Notes (Signed)
Pt here for chest discomfort. Pt daughter is requesting EMS for transport.

## 2017-04-16 NOTE — H&P (Signed)
[facility] NAME: Krista Ellis    MR#:  [id number]  DATE OF BIRTH:  Dec 23, 1930  DATE OF ADMISSION:  04/16/2017  PRIMARY CARE PHYSICIAN: Venia Carbon, MD   REQUESTING/REFERRING PHYSICIAN:   CHIEF COMPLAINT:   Chief Complaint  Patient presents with  . Chest Pain    HISTORY OF PRESENT ILLNESS: Krista Ellis  is a 84 y.o. female with a known history per below which also includes peptic ulcer disease, presenting from local nursing facility with complaint of chest pain that woke patient up from sleep, was 5 out of 10, workup in the emergency room noted for white count of 13,000, chest x-ray negative, cardiac enzymes negative, sinus tachycardia with heart rate of 107, noted echo and nuclear medicine stress testing done in April of last year were normal studies, patient evaluated at bedside in the emergency room, daughter at the bedside, patient has some dementia-somewhat poor historian, patient denies any pain, per daughter-patient recently tapered off her Pepcid, hospitalist service subsequently being consulted for acute chest pain which is more likely secondary to acute on chronic peptic ulcer disease.  PAST MEDICAL HISTORY:   Past Medical History:  Diagnosis Date  . Arthritis   . Bleeding disorder ([place])   . Chronic cystitis   . Collagen vascular disease ([place])   . CVA (cerebral infarction) 2003  . Dysuria   . Gross hematuria   . Hematuria   . Hemihypertrophy   . History of kidney stones   . Hyperlipidemia   . Hypertension   . Murmur, cardiac   . Myocardial infarction ([place])   . Peripheral vascular disease ([place])    s/p stent right leg  . Pulmonary nodule    stable on Chest CT March 2014, [facility] at [facility]  . Recurrent UTI   . Sensory urge incontinence   . Thyroid disease   . Urinary frequency     PAST SURGICAL HISTORY:  Past Surgical History:  Procedure Laterality Date  . ABDOMINAL HYSTERECTOMY  1982  .  APPENDECTOMY    . CARDIAC SURGERY     2 failed stents in right leg  . CARPAL TUNNEL RELEASE    . HIP SURGERY  2007   replaced ball in right hip  . left rotator cuff     repair  . ruptured disc    . SPINE SURGERY     steel rod in back  . stent placement leg Right 2015  . TONSILLECTOMY    . TOTAL HIP ARTHROPLASTY     right  . TOTAL KNEE ARTHROPLASTY Left     SOCIAL HISTORY:  Social History   Tobacco Use  . Smoking status: Never Smoker  . Smokeless tobacco: Never Used  Substance Use Topics  . Alcohol use: No    FAMILY HISTORY:  Family History  Problem Relation Age of Onset  . Hypertension Mother   . Transient ischemic attack Mother   . Kidney disease Neg Hx   . Bladder Cancer Neg Hx   . Prostate cancer Neg Hx     DRUG ALLERGIES:  Allergies  Allergen Reactions  . Sulfa Antibiotics Hives and Itching  . Ciprofloxacin Hives and Itching  . Penicillins Hives and Itching    .Has patient had a PCN reaction causing immediate rash, facial/tongue/throat swelling, SOB or lightheadedness with hypotension: Unknown Has patient had a PCN reaction causing severe rash involving mucus membranes or skin necrosis: Unknown Has patient had a PCN reaction that  required hospitalization: Unknown Has patient had a PCN reaction occurring within the last 10 years: Unknown If all of the above answers are "NO", then may proceed with Cephalosporin use.     REVIEW OF SYSTEMS: Poor historian due to dementia  CONSTITUTIONAL: No fever, fatigue or weakness.  EYES: No blurred or double vision.  EARS, NOSE, AND THROAT: No tinnitus or ear pain.  RESPIRATORY: No cough, shortness of breath, wheezing or hemoptysis.  CARDIOVASCULAR: + chest pain by report-patient denies any pain (dementia noted), orthopnea, edema.  GASTROINTESTINAL: No nausea, vomiting, diarrhea or abdominal pain.  GENITOURINARY: No dysuria, hematuria.  ENDOCRINE: No polyuria, nocturia,  HEMATOLOGY: No anemia, easy bruising or  bleeding SKIN: No rash or lesion. MUSCULOSKELETAL: No joint pain or arthritis.   NEUROLOGIC: No tingling, numbness, weakness.  Intermittent confusion/disorientation PSYCHIATRY: No anxiety or depression.   MEDICATIONS AT HOME:  Prior to Admission medications   Medication Sig Start Date End Date Taking? Authorizing Provider  acetaminophen (TYLENOL) 325 MG tablet Take 325 mg by mouth every 4 (four) hours as needed for mild pain, moderate pain, fever or headache. Reported on 02/27/2015    [provider]  acetaminophen (TYLENOL) 650 MG CR tablet Take 650 mg by mouth every 8 (eight) hours.    [provider]  amLODipine (NORVASC) 5 MG tablet Take 5 mg by mouth daily.    [provider]  apixaban (ELIQUIS) 5 MG TABS tablet Two tablets twice a day for six days then one tablet twice a day afterwards Patient taking differently: Take 5 mg by mouth 2 (two) times daily.  01/27/16   Loletha Grayer, MD  Ascorbic Acid (VITAMIN C) 1000 MG tablet Take 1 tablet (1,000 mg total) by mouth daily. 02/13/16   Zara Council A, PA-C  bisacodyl (DULCOLAX) 10 MG suppository Place 10 mg rectally daily as needed for moderate constipation.    [provider]  cetirizine (ZYRTEC) 10 MG tablet Take 10 mg by mouth daily.    [provider]  Cholecalciferol (VITAMIN D3) 50000 units CAPS Take 50,000 Units by mouth every 30 (thirty) days. Patient takes on the 13th    [provider]  Cranberry 425 MG CAPS Take 425 mg by mouth daily.    [provider]  cyanocobalamin (,VITAMIN B-12,) 1000 MCG/ML injection Inject 1,000 mcg into the muscle every 30 (thirty) days. On the 2nd of each month    [provider]  dextromethorphan-guaiFENesin (ROBITUSSIN-DM) 10-100 MG/5ML liquid Take 10 mLs by mouth every 4 (four) hours as needed for cough.    [provider]  guaiFENesin-dextromethorphan (ROBITUSSIN DM) 100-10 MG/5ML syrup Take 5 mLs by mouth every 6 (six)  hours as needed for cough. 08/16/16   Hillary Bow, MD  hydrOXYzine (ATARAX/VISTARIL) 25 MG tablet Take 12.5 mg by mouth 3 (three) times daily as needed for itching.    [provider]  levofloxacin (LEVAQUIN) 500 MG tablet Take 1 tablet (500 mg total) by mouth daily. 08/16/16   Hillary Bow, MD  levothyroxine (SYNTHROID, LEVOTHROID) 25 MCG tablet TAKE 1 TABLET (25 MCG TOTAL) BY MOUTH DAILY. Patient taking differently: Take 25 mcg by mouth daily.  07/04/14   Jackolyn Confer, MD  magnesium hydroxide (MILK OF MAGNESIA) 400 MG/5ML suspension Take 30 mLs by mouth daily as needed for mild constipation or moderate constipation.    [provider]  menthol-cetylpyridinium (CEPACOL) 3 MG lozenge Take 1 lozenge by mouth as needed for sore throat.    [provider]  metoprolol succinate (TOPROL-XL) 25 MG 24 hr tablet Take 12.5 mg by mouth daily.     [provider]  omeprazole (PRILOSEC) 20 MG capsule Take 1 capsule (20 mg total) by mouth 2 (two) times daily before a meal. 05/15/16   Sudini, Alveta Heimlich, MD  ondansetron (ZOFRAN) 4 MG tablet Take 4 mg by mouth every 8 (eight) hours as needed for nausea or vomiting.    [provider]  phenazopyridine (PYRIDIUM) 100 MG tablet Take 100 mg by mouth 2 (two) times daily as needed for pain.    [provider]  polyethylene glycol (MIRALAX / GLYCOLAX) packet Take 17 g by mouth daily as needed for mild constipation, moderate constipation or severe constipation. Reported on 02/27/2015    [provider]  pravastatin (PRAVACHOL) 40 MG tablet Take 1 tablet (40 mg total) by mouth every evening. 07/04/14   Jackolyn Confer, MD  Probiotic Product (ALIGN) 4 MG CAPS Take 1 capsule by mouth daily.    [provider]  senna (SENOKOT) 8.6 MG TABS tablet Take 2 tablets by mouth daily.    [provider]  sodium chloride (OCEAN) 0.65 % SOLN nasal spray Place 1 spray into both nostrils every 2 (two) hours  as needed for congestion.    [provider]  traMADol (ULTRAM) 50 MG tablet Take 0.5 tablets (25 mg total) by mouth every 8 (eight) hours as needed for moderate pain. 01/27/16   Loletha Grayer, MD      PHYSICAL EXAMINATION:   VITAL SIGNS: Pulse (!) 109, temperature 98.1 F (36.7 C), temperature source Oral, resp. rate (!) 27, height 5\' 3"  (1.6 m), weight 47.2 kg (104 lb), SpO2 94 %.  GENERAL:  84 y.o.-year-old patient lying in the bed with no acute distress.  Frail-appearing EYES: Pupils equal, round, reactive to light and accommodation. No scleral icterus. Extraocular muscles intact.  HEENT: Head atraumatic, normocephalic. Oropharynx and nasopharynx clear.  NECK:  Supple, no jugular venous distention. No thyroid enlargement, no tenderness.  LUNGS: Normal breath sounds bilaterally, no wheezing, rales,rhonchi or crepitation. No use of accessory muscles of respiration.  CARDIOVASCULAR: S1, S2 normal. No murmurs, rubs, or gallops.  ABDOMEN: Soft, epigastric tenderness on palpation, nondistended. Bowel sounds present. No organomegaly or mass.  EXTREMITIES: No pedal edema, cyanosis, or clubbing.  NEUROLOGIC: Cranial nerves II through XII are intact. Muscle strength 5/5 in all extremities. Sensation intact. Gait not checked.  PSYCHIATRIC: The patient is alert and oriented x 3.  SKIN: No obvious rash, lesion, or ulcer.   LABORATORY PANEL:   CBC Recent Labs  Lab 04/16/17 0350  WBC 13.5*  HGB 14.1  HCT 42.8  PLT 302  MCV 93.9  MCH 30.9  MCHC 32.9  RDW 12.5   ------------------------------------------------------------------------------------------------------------------  Chemistries  Recent Labs  Lab 04/16/17 0350  NA 141  K 4.1  CL 106  CO2 26  GLUCOSE 120*  BUN 20  CREATININE 0.71  CALCIUM 9.6   ------------------------------------------------------------------------------------------------------------------ estimated creatinine clearance is 39 mL/min (by C-G  formula based on SCr of 0.71 mg/dL). ------------------------------------------------------------------------------------------------------------------ No results for input(s): TSH, T4TOTAL, T3FREE, THYROIDAB in the last 72 hours.  Invalid input(s): FREET3   Coagulation profile No results for input(s): INR, PROTIME in the last 168 hours. ------------------------------------------------------------------------------------------------------------------- No results for input(s): DDIMER in the last 72 hours. -------------------------------------------------------------------------------------------------------------------  Cardiac Enzymes Recent Labs  Lab 04/16/17 0350  TROPONINI <0.03   ------------------------------------------------------------------------------------------------------------------ Invalid input(s): POCBNP  ---------------------------------------------------------------------------------------------------------------  Urinalysis    Component Value Date/Time  COLORURINE YELLOW (A) 05/13/2016 0541   APPEARANCEUR CLOUDY (A) 05/13/2016 0541   APPEARANCEUR Turbid (A) 12/05/2015 1206   LABSPEC 1.013 05/13/2016 0541   LABSPEC 1.019 04/18/2014 1455   PHURINE 5.0 05/13/2016 0541   GLUCOSEU NEGATIVE 05/13/2016 0541   GLUCOSEU Negative 04/18/2014 1455   HGBUR SMALL (A) 05/13/2016 0541   BILIRUBINUR NEGATIVE 05/13/2016 0541   BILIRUBINUR Positive (A) 05/28/2015 1557   BILIRUBINUR Negative 04/18/2014 1455   KETONESUR NEGATIVE 05/13/2016 0541   PROTEINUR NEGATIVE 05/13/2016 0541   UROBILINOGEN 1.0 05/20/2014 0225   NITRITE NEGATIVE 05/13/2016 0541   LEUKOCYTESUR LARGE (A) 05/13/2016 0541   LEUKOCYTESUR 3+ (A) 05/28/2015 1557   LEUKOCYTESUR 2+ 04/18/2014 1455     RADIOLOGY: Dg Chest 2 View  Result Date: 04/16/2017 CLINICAL DATA:  84 year old female with chest pain. EXAM: CHEST - 2 VIEW COMPARISON:  Chest radiograph dated 08/14/2016 FINDINGS: Diffuse interstitial  coarsening. No focal consolidation, pleural effusion, or pneumothorax. The cardiac silhouette is within normal limits. There is osteopenia with degenerative changes of the spine and shoulders. No acute osseous pathology. IMPRESSION: No active cardiopulmonary disease. Electronically Signed   By: Anner Crete M.D.   On: 04/16/2017 04:17    EKG: Orders placed or performed during the hospital encounter of 04/16/17  . ED EKG within 10 minutes  . ED EKG within 10 minutes  . EKG 12-Lead  . EKG 12-Lead    IMPRESSION AND PLAN: 1 acute chest pain Most likely secondary to peptic ulcer disease  Acute coronary syndrome doubt it given recent normal echo/nuclear medicine stress testing in April 2018  D/W ER attending, recommend PPI, Carafate 4 times daily, and okay for discharge back to nursing facility  2 hx CAD s/p MI Appears stable On Eliquis, Toprol XL, statin therapy  3 chronic dementia Stable  4 chronic benign essential hypertension  stable on current regiment  5 acute UTI Recently started on p.o. antibiotics this week Recommend completion of antibiotic course  6 chronic hypothyroidism, unspecified Stable Continue Synthroid    All the records are reviewed and case discussed with ED provider. Management plans discussed with the patient, family and they are in agreement.  CODE STATUS:dnr Code Status History    Date Active Date Inactive Code Status Order ID Comments User Context   08/14/2016 1948 08/16/2016 2001 DNR [id number]  Baxter Hire, MD Inpatient   05/13/2016 0817 05/15/2016 1951 DNR [id number]  Harrie Foreman, MD Inpatient   01/26/2016 0158 01/27/2016 1501 DNR [id number]  Harvie Bridge, DO Inpatient   01/26/2016 0127 01/26/2016 0158 Full Code [id number]  Harvie Bridge, DO Inpatient   09/22/2014 1621 09/24/2014 1708 DNR [id number]  Vaughan Basta, MD Inpatient   09/10/2014 2137 09/14/2014 1924 DNR [id number]  Gladstone Lighter, MD Inpatient   05/18/2014 1903  05/24/2014 1631 DNR [id number]  Samella Parr, NP Inpatient   05/05/2014 2315 05/06/2014 1726 Partial Code [id number]  Mariea Clonts, MD Inpatient    Questions for Most Recent Historical Code Status (Order [id number])    Question Answer Comment   In the event of cardiac or respiratory ARREST Do not call a "code blue"    In the event of cardiac or respiratory ARREST Do not perform Intubation, CPR, defibrillation or ACLS    In the event of cardiac or respiratory ARREST Use medication by any route, position, wound care, and other measures to relive pain and suffering. May use oxygen, suction and manual treatment of airway obstruction as needed for comfort.  TOTAL TIME TAKING CARE OF THIS PATIENT: 45 minutes.    Avel Peace Gracen Southwell M.D on 04/16/2017   Between 7am to 6pm - Pager - [phone]  After 6pm go to [email or web address] - password [email or web address] [facility]  Office  [phone]  CC: Primary care physician; Venia Carbon, MD   Note: This dictation was prepared with Dragon dictation along with smaller phrase technology. Any transcriptional errors that result from this process are unintentional.

## 2017-04-16 NOTE — ED Triage Notes (Signed)
Patient from [facility] [place] that came in EMS for chest pain that woke her up in the middle of the night. Denies any nausea. Pain is pressure 5/10 in central chest.

## 2017-04-16 NOTE — ED Notes (Signed)
Ed Sec aware of EMS transport. Transport was called. RN will monitor. Family is aware at this time.

## 2017-04-16 NOTE — Telephone Encounter (Signed)
Per chart review tab pt is at [facility] ED. 

## 2017-04-16 NOTE — ED Notes (Signed)
Pt was placed on bedpan. Pt unable to use at this time. Peri care performed, brief changed.

## 2017-04-16 NOTE — Discharge Instructions (Signed)
1.  Start Carafate with meals and at nighttime. 2.  Continue Prilosec as directed by your doctor. 3.  Return to the ER for worsening symptoms, persistent vomiting, difficulty breathing or other concerns.

## 2017-04-16 NOTE — Telephone Encounter (Signed)
Discussed with her nurse here at [facility] Had just started macrobid for possible UTI Will follow up on ER disposition

## 2017-05-13 DIAGNOSIS — G9009 Other idiopathic peripheral autonomic neuropathy: Secondary | ICD-10-CM | POA: Diagnosis not present

## 2017-05-13 DIAGNOSIS — I739 Peripheral vascular disease, unspecified: Secondary | ICD-10-CM | POA: Diagnosis not present

## 2017-05-13 DIAGNOSIS — B351 Tinea unguium: Secondary | ICD-10-CM | POA: Diagnosis not present

## 2017-05-13 DIAGNOSIS — E039 Hypothyroidism, unspecified: Secondary | ICD-10-CM | POA: Diagnosis not present

## 2017-05-13 DIAGNOSIS — K219 Gastro-esophageal reflux disease without esophagitis: Secondary | ICD-10-CM | POA: Diagnosis not present

## 2017-05-13 DIAGNOSIS — I10 Essential (primary) hypertension: Secondary | ICD-10-CM | POA: Diagnosis not present

## 2017-05-13 DIAGNOSIS — Z86711 Personal history of pulmonary embolism: Secondary | ICD-10-CM | POA: Diagnosis not present

## 2017-05-13 DIAGNOSIS — G3184 Mild cognitive impairment, so stated: Secondary | ICD-10-CM | POA: Diagnosis not present

## 2017-05-16 IMAGING — CT CT ANGIO CHEST
2 of 6 series · 17 of 46 positions shown · IV contrast (APPLIED)
Comparison: Chest CT June 01, 2015 chest radiograph January 25, 2016

CLINICAL DATA: Chest pain and shortness of breath

EXAM:
CT ANGIOGRAPHY CHEST WITH CONTRAST
TECHNIQUE: Multidetector CT imaging of the chest was performed using the
standard protocol during bolus administration of intravenous
contrast. Multiplanar CT image reconstructions and MIPs were
obtained to evaluate the vascular anatomy.
CONTRAST:  75 mL Isovue 370 nonionic

[Series 5: thins · axial · 0.54mm/px · z∈[-567,-344]mm · 15 of 245 slices shown]
[im 11/245  lung]
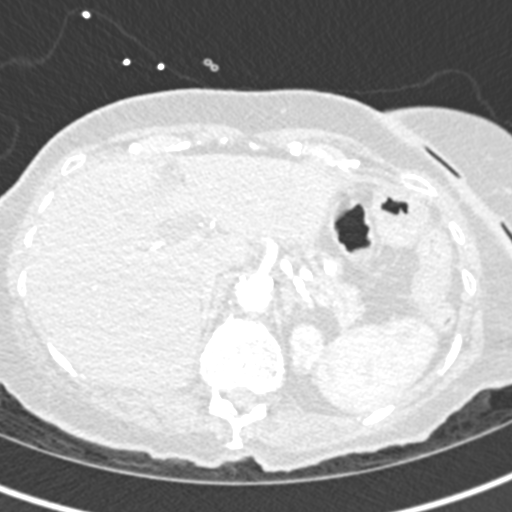
[im 32/245  soft-tissue]
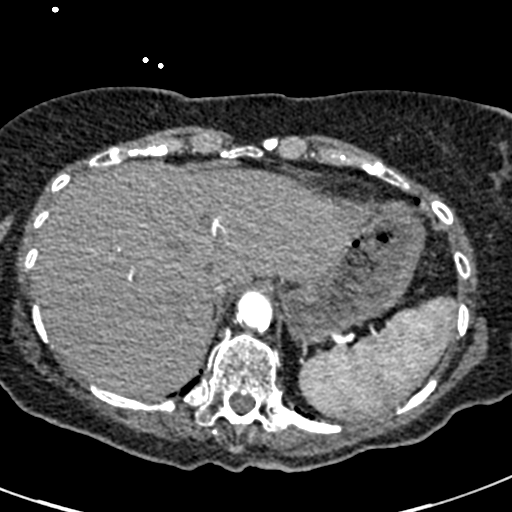
[im 43/245  lung]
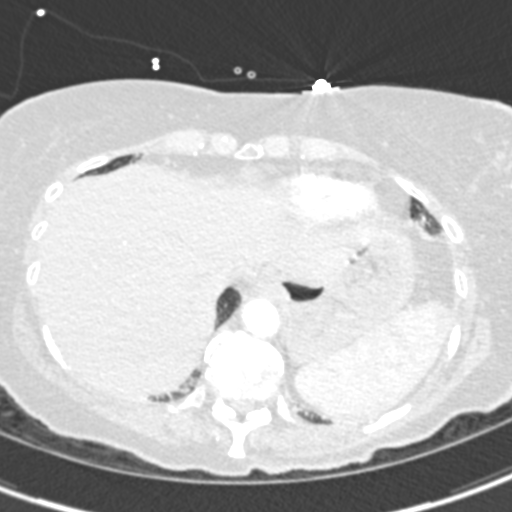
[im 64/245  soft-tissue]
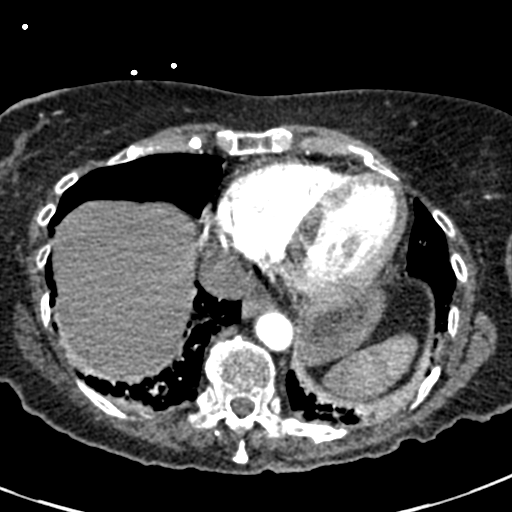
[im 75/245  lung]
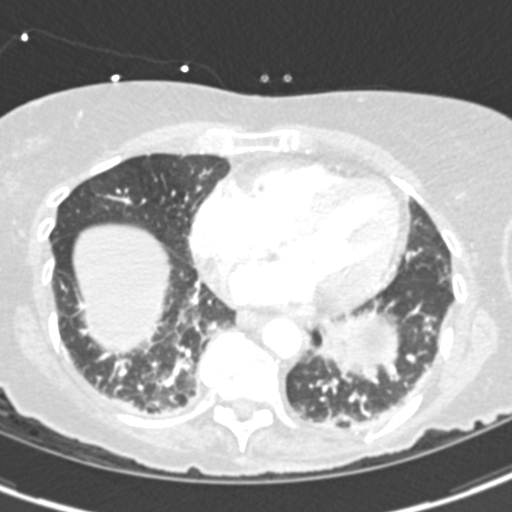
[im 96/245  soft-tissue]
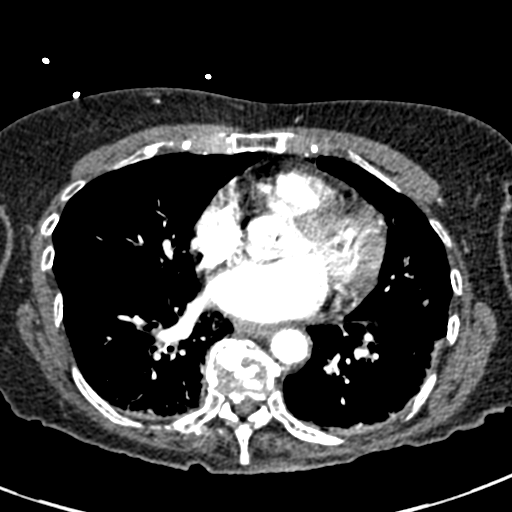
[im 107/245  lung]
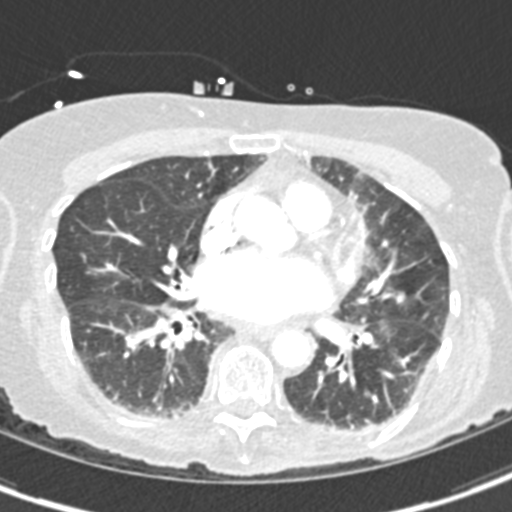
[im 128/245  soft-tissue]
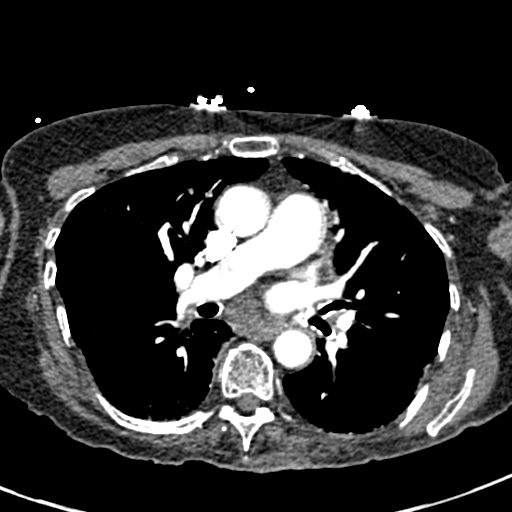
[im 138/245  lung]
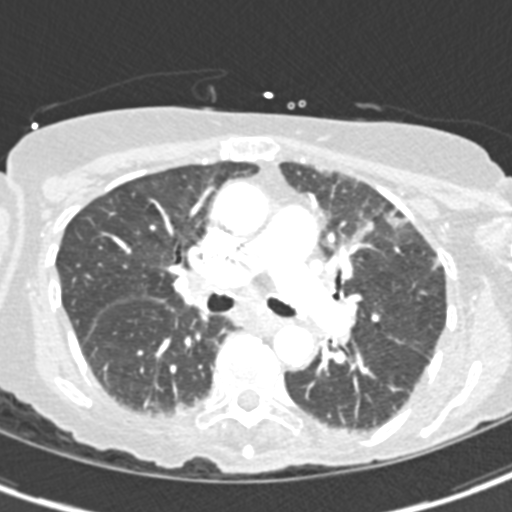
[im 149/245  soft-tissue]
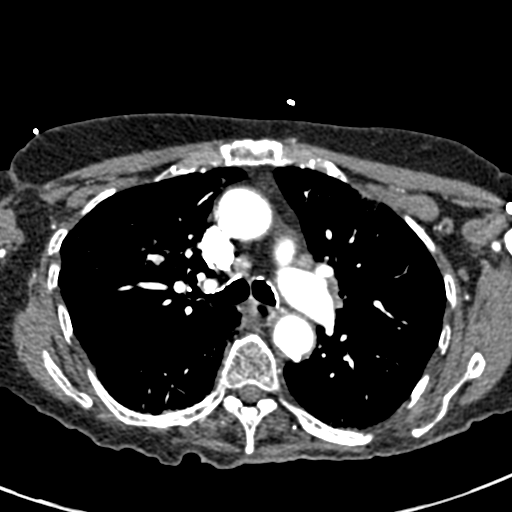
[im 170/245  lung]
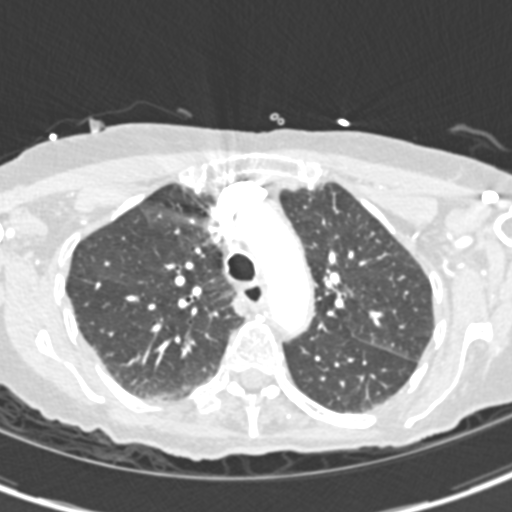
[im 181/245  soft-tissue]
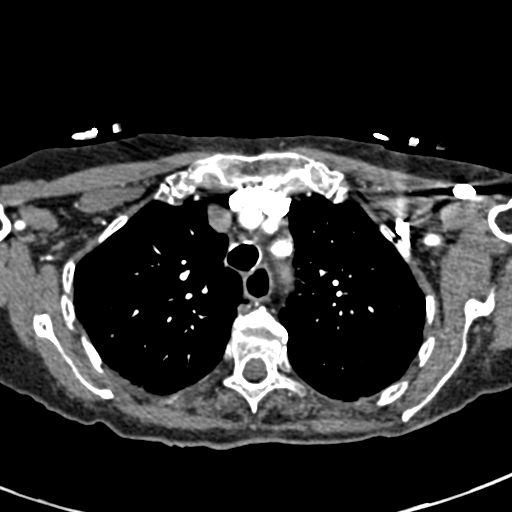
[im 202/245  lung]
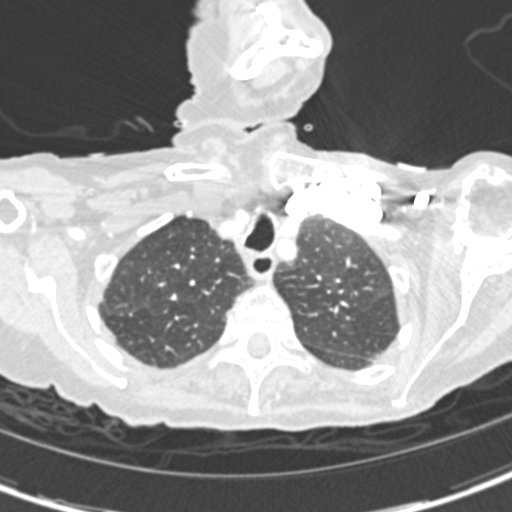
[im 213/245  soft-tissue]
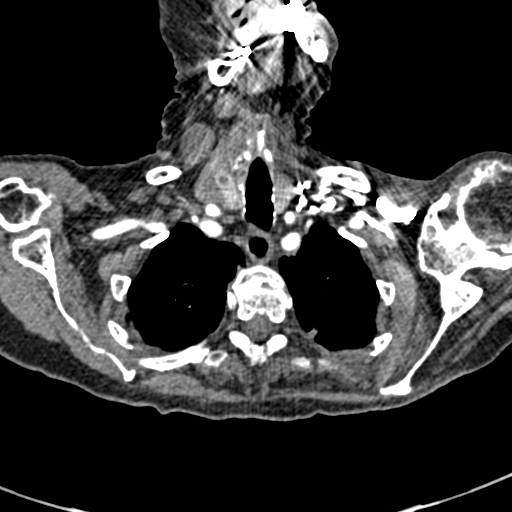
[im 234/245  lung]
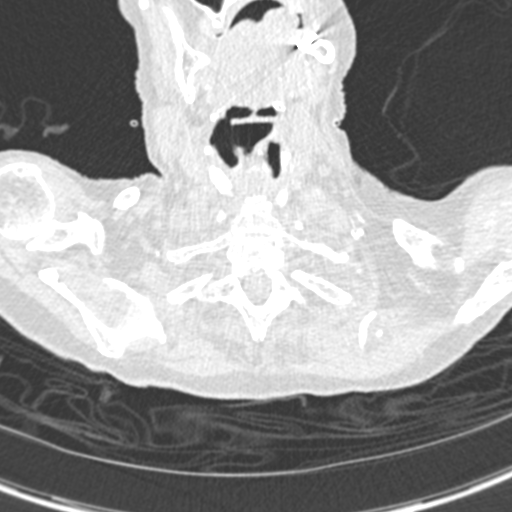

[Series 7: coronal mpr · coronal · 0.49mm/px · 2 of 67 slices shown]
[im 23/67  soft-tissue]
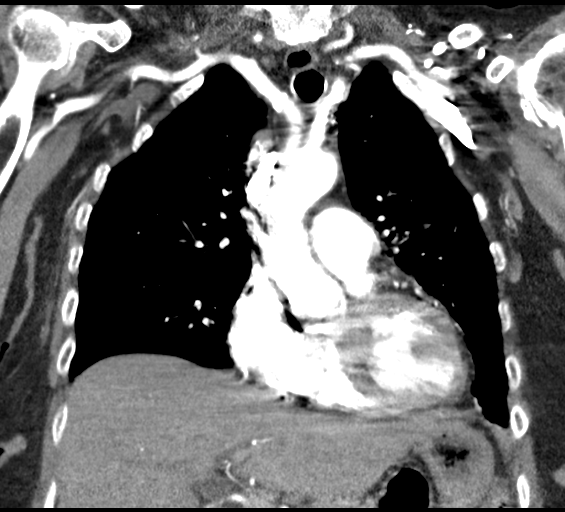
[im 45/67  soft-tissue]
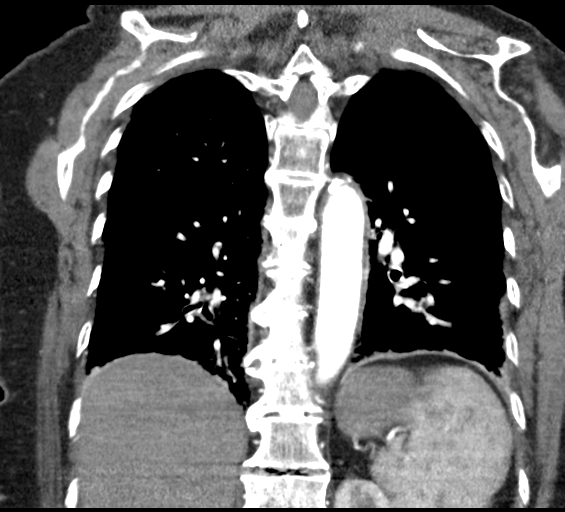

[17 of 46 positions shown; findings below may reference images not displayed]

FINDINGS: Cardiovascular: There is a small pulmonary embolus in a branch of
the anterior segment right upper lobe pulmonary artery. No more
central pulmonary embolus identified. The right ventricle to left
ventricle diameter does not show evidence of right heart strain.
There is no demonstrable thoracic aortic aneurysm or dissection.
There is mild calcification in the proximal left subclavian artery.
There is also mild calcification in the proximal right common
carotid artery. Visualized great vessels otherwise appear normal.
There are scattered foci of atherosclerotic calcification in the
aorta. There are scattered foci of coronary artery calcification.
There is no appreciable pericardial effusion. There is calcification
in the aortic valve, stable.

Mediastinum/Nodes: There are nodular opacities in the thyroid,
largest measuring approximately 1 cm. There is a lymph node in the
aortopulmonary window region measuring 1.1 x 1.1 cm, stable. There
is a lymph node in the sub- carinal region measuring 1.5 x 1.5 cm,
stable. Several smaller mediastinal lymph nodes are stable. No new
lymph node prominence evident.

Lungs/Pleura: On axial slice 41 series 6, there is a 4 mm nodular
opacity in the anterior segment of the right upper lobe. On axial
slice 47 series 6, there is a stable 6 x 5 mm nodular opacity in the
posterior segment of the left upper lobe. On axial slice 51 series
6, there is a stable 6 x 5 mm nodular opacity with a nearby 5 x 4 mm
nodular opacity in this area. On axial slice 49 series 6, there is a
4 mm nodular opacity in the lingula anteriorly. There is a nodular
opacity more posteriorly in the lingula on axial slice 49 series 5
measuring 4 mm. There is a nearby 3 mm nodular opacity in this area
as well, stable. If there is a stable 3 mm nodular opacity in the
inferior lingula anteriorly near the left heart border. There is
patchy bibasilar atelectasis. There are areas of probable air
trapping from small airways disease, primarily in the right lower
lobe anteriorly and medially. There is no frank edema or
consolidation. No pleural effusion or pleural thickening evident.

Upper Abdomen: In the visualized upper abdomen, the left adrenal
appears somewhat hypertrophied. There is atherosclerotic
calcification in the aorta and major branch vessels.

Musculoskeletal: There is degenerative change in the thoracic spine.
There is no blastic or lytic bone lesion.

Review of the MIP images confirms the above findings.
IMPRESSION: Small pulmonary embolus in a peripheral branch of the anterior
segment right upper lobe pulmonary artery. No larger pulmonary
emboli evident. No appreciable right heart strain.

Multiples areas of atherosclerotic calcification including multiple
foci of coronary artery calcification.

Several mildly prominent lymph nodes, stable.

Several pulmonary nodular opacities as noted above. Patchy bibasilar
atelectasis. Probable small airways disease, primarily on the right.
No frank edema or consolidation.

Multinodular goiter without dominant thyroid mass evident.

Evidence a degree of left adrenal hypertrophy.

Critical Value/emergent results were called by telephone at the time
of interpretation on 01/25/2016 at [DATE] to Dr. LORRAINE JIM , who
verbally acknowledged these results.

## 2017-05-19 DIAGNOSIS — N939 Abnormal uterine and vaginal bleeding, unspecified: Secondary | ICD-10-CM | POA: Diagnosis not present

## 2017-05-29 ENCOUNTER — Encounter: Payer: Self-pay | Admitting: Obstetrics and Gynecology

## 2017-05-29 ENCOUNTER — Ambulatory Visit (INDEPENDENT_AMBULATORY_CARE_PROVIDER_SITE_OTHER): Payer: Medicare Other | Admitting: Obstetrics and Gynecology

## 2017-05-29 VITALS — BP 130/78 | HR 50 | Wt 135.0 lb

## 2017-05-29 DIAGNOSIS — N950 Postmenopausal bleeding: Secondary | ICD-10-CM | POA: Diagnosis not present

## 2017-05-29 DIAGNOSIS — Z90710 Acquired absence of both cervix and uterus: Secondary | ICD-10-CM | POA: Diagnosis not present

## 2017-05-29 NOTE — Progress Notes (Signed)
Gynecology H&P  Chief Complaint:  Chief Complaint  Patient presents with  . Vaginal Bleeding    Referred by [facility] sporadic x 1 week, no cramping    History of Present Illness: Patient is a 84 y.o. No obstetric history on file. presents evaluation of postmenopausal bleeding. The patient states she has had a multiple episode(s) of bleeding in the past 4 month(s).  The bleeding has been has varied from light spotting to heavy bleeding . She describes the blood as Bright red in appearance.  She has had no additional complaints. She deniestrauma or other inciting event, bloating, cramping, early satiety and abdominal pain. She does not have a history of abnormal pap smears.   The patient is not currently sexually active There are no other aggravating factors reported. There are no alleviating factors reported. The patient's past medical history is noncontributory is notable for currently being on blood thinner..   She has nothad prior work up for postmenopausal bleeding.  Only current hormone exposure is vaginal premarin cream.  She denies prolapse symptoms or hemorrhoids.  No hematochezia or melena.  Review of Systems: 10 point review of systems negative unless otherwise noted in HPI  Past Medical History:  Past Medical History:  Diagnosis Date  . Arthritis   . Bleeding disorder ([place])   . Chronic cystitis   . Collagen vascular disease ([place])   . CVA (cerebral infarction) 2003  . Dysuria   . Gross hematuria   . Hematuria   . Hemihypertrophy   . History of kidney stones   . Hyperlipidemia   . Hypertension   . Murmur, cardiac   . Myocardial infarction ([place])   . Peripheral vascular disease ([place])    s/p stent right leg  . Pulmonary nodule    stable on Chest CT March 2014, Followed at [facility]  . Recurrent UTI   . Sensory urge incontinence   . Thyroid disease   . Urinary frequency     Past Surgical History:  Past Surgical History:  Procedure Laterality Date  . ABDOMINAL  HYSTERECTOMY  1982  . APPENDECTOMY    . CARDIAC SURGERY     2 failed stents in right leg  . CARPAL TUNNEL RELEASE    . HIP SURGERY  2007   replaced ball in right hip  . left rotator cuff     repair  . ruptured disc    . SPINE SURGERY     steel rod in back  . stent placement leg Right 2015  . TONSILLECTOMY    . TOTAL HIP ARTHROPLASTY     right  . TOTAL KNEE ARTHROPLASTY Left     Family History:  Family History  Problem Relation Age of Onset  . Hypertension Mother   . Transient ischemic attack Mother   . Kidney disease Neg Hx   . Bladder Cancer Neg Hx   . Prostate cancer Neg Hx     Social History:  Social History   Socioeconomic History  . Marital status: Widowed    Spouse name: Not on file  . Number of children: 3  . Years of education: Not on file  . Highest education level: Not on file  Occupational History  . Not on file  Social Needs  . Financial resource strain: Not on file  . Food insecurity:    Worry: Not on file    Inability: Not on file  . Transportation needs:    Medical: Not on file    Non-medical:  Not on file  Tobacco Use  . Smoking status: Never Smoker  . Smokeless tobacco: Never Used  Substance and Sexual Activity  . Alcohol use: No  . Drug use: No  . Sexual activity: Not on file  Lifestyle  . Physical activity:    Days per week: Not on file    Minutes per session: Not on file  . Stress: Not on file  Relationships  . Social connections:    Talks on phone: Not on file    Gets together: Not on file    Attends religious service: Not on file    Active member of club or organization: Not on file    Attends meetings of clubs or organizations: Not on file    Relationship status: Not on file  . Intimate partner violence:    Fear of current or ex partner: Not on file    Emotionally abused: Not on file    Physically abused: Not on file    Forced sexual activity: Not on file  Other Topics Concern  . Not on file  Social History Narrative    Lives at home with caregiver.   Caffeine use: Drink 1/2 cup coffee at breakfast    Allergies:  Allergies  Allergen Reactions  . Sulfa Antibiotics Hives and Itching  . Ciprofloxacin Hives and Itching  . Penicillins Hives and Itching    .Has patient had a PCN reaction causing immediate rash, facial/tongue/throat swelling, SOB or lightheadedness with hypotension: Unknown Has patient had a PCN reaction causing severe rash involving mucus membranes or skin necrosis: Unknown Has patient had a PCN reaction that required hospitalization: Unknown Has patient had a PCN reaction occurring within the last 10 years: Unknown If all of the above answers are "NO", then may proceed with Cephalosporin use.     Medications: Prior to Admission medications   Medication Sig Start Date End Date Taking? Authorizing Provider  amLODipine (NORVASC) 5 MG tablet Take 5 mg by mouth daily.   Yes [provider]  apixaban (ELIQUIS) 5 MG TABS tablet Two tablets twice a day for six days then one tablet twice a day afterwards Patient taking differently: Take 5 mg by mouth 2 (two) times daily.  01/27/16  Yes Wieting, Richard, MD  Ascorbic Acid (VITAMIN C) 1000 MG tablet Take 1 tablet (1,000 mg total) by mouth daily. 02/13/16  Yes McGowan, Larene Beach A, PA-C  bisacodyl (DULCOLAX) 10 MG suppository Place 10 mg rectally daily as needed for moderate constipation.   Yes [provider]  cetirizine (ZYRTEC) 10 MG tablet Take 10 mg by mouth daily.   Yes [provider]  Cholecalciferol (VITAMIN D3) 50000 units CAPS Take 50,000 Units by mouth every 30 (thirty) days. Patient takes on the 13th   Yes [provider]  Cranberry 425 MG CAPS Take 425 mg by mouth daily.   Yes [provider]  cyanocobalamin (,VITAMIN B-12,) 1000 MCG/ML injection Inject 1,000 mcg into the muscle every 30 (thirty) days. On the 2nd of each month   Yes [provider]  dextromethorphan-guaiFENesin  (ROBITUSSIN-DM) 10-100 MG/5ML liquid Take 10 mLs by mouth every 4 (four) hours as needed for cough.   Yes [provider]  levothyroxine (SYNTHROID, LEVOTHROID) 25 MCG tablet TAKE 1 TABLET (25 MCG TOTAL) BY MOUTH DAILY. Patient taking differently: Take 25 mcg by mouth daily.  07/04/14  Yes Jackolyn Confer, MD  magnesium hydroxide (MILK OF MAGNESIA) 400 MG/5ML suspension Take 30 mLs by mouth daily as needed  for mild constipation or moderate constipation.   Yes [provider]  menthol-cetylpyridinium (CEPACOL) 3 MG lozenge Take 1 lozenge by mouth as needed for sore throat.   Yes [provider]  metoprolol succinate (TOPROL-XL) 25 MG 24 hr tablet Take 12.5 mg by mouth daily.    Yes [provider]  ondansetron (ZOFRAN) 4 MG tablet Take 4 mg by mouth every 8 (eight) hours as needed for nausea or vomiting.   Yes [provider]  phenazopyridine (PYRIDIUM) 100 MG tablet Take 100 mg by mouth 2 (two) times daily as needed for pain.   Yes [provider]  polyethylene glycol (MIRALAX / GLYCOLAX) packet Take 17 g by mouth daily as needed for mild constipation, moderate constipation or severe constipation. Reported on 02/27/2015   Yes [provider]  Probiotic Product (ALIGN) 4 MG CAPS Take 1 capsule by mouth daily.   Yes [provider]  senna (SENOKOT) 8.6 MG TABS tablet Take 2 tablets by mouth daily.   Yes [provider]  sodium chloride (OCEAN) 0.65 % SOLN nasal spray Place 1 spray into both nostrils every 2 (two) hours as needed for congestion.   Yes [provider]  sucralfate (CARAFATE) 1 GM/10ML suspension Take 10 mLs (1 g total) by mouth 4 (four) times daily. 04/16/17  Yes Paulette Blanch, MD  hydrOXYzine (ATARAX/VISTARIL) 25 MG tablet Take 12.5 mg by mouth 3 (three) times daily as needed for itching.    [provider]  levofloxacin (LEVAQUIN) 500 MG tablet Take 1 tablet (500 mg total) by mouth daily.  08/16/16   Hillary Bow, MD  omeprazole (PRILOSEC) 20 MG capsule Take 1 capsule (20 mg total) by mouth 2 (two) times daily before a meal. 05/15/16   Hillary Bow, MD  pravastatin (PRAVACHOL) 40 MG tablet Take 1 tablet (40 mg total) by mouth every evening. 07/04/14   Jackolyn Confer, MD  traMADol (ULTRAM) 50 MG tablet Take 0.5 tablets (25 mg total) by mouth every 8 (eight) hours as needed for moderate pain. 01/27/16   Loletha Grayer, MD    Physical Exam Vitals: Blood pressure 130/78, pulse (!) 50, weight 135 lb (61.2 kg).  General: NAD HEENT: normocephalic, anicteric Pulmonary: No increased work of breathing Genitourinary:  External: Normal external female genitalia.  Normal urethral meatus, normal  Bartholin's and Skene's glands.    Vagina: Normal vaginal mucosa, no evidence of prolapse, mass, or bleeding.    Cervix: surgically absent  Uterus: surgically absent  Adnexa: ovaries non-enlarged, no adnexal masses  Rectal: deferred Extremities: no edema, erythema, or tenderness Neurologic: Grossly intact Psychiatric: mood appropriate, affect full  Assessment: 84 y.o. presenting for evaluation of postmenopausal bleeding  Plan: Problem List Items Addressed This Visit    None    Visit Diagnoses    Postmenopausal bleeding    -  Primary   Relevant Orders   US Pelvis Complete   Status post hysterectomy          1) We discussed that menopause is a clinical diagnosis made after 12 months of amenorrhea.  The average age of menopause in the  [place] population is 65 but there may be significant variation.  Any bleeding that happens after a 12 month period of amenorrhea warrants further work.  Possible etiologies of postmenopausal bleeding were discussed with the patient today. She is status post prior hysterectomy per history but will evaluate vaginal cuff for any abnormalities.  It would also be helpful to see the  patient during an acute episode of bleeding to demonstrate the underlying  cause.  Rectal or urinary tract are the other potential etiologies in the setting of prior hysterectomy.  2) Evaluation by pelvc ultrasound scheduled, with follow up after [place].   3) Return in about 1 week (around 06/05/2017) for TVUS postmenopausal bleeding.   Malachy Mood, MD, [facility], [facility] 05/29/2017, 9:43 PM            No lesions noted vaginally, atrophic cervix, uterus surgically absent prior hysterectomy

## 2017-06-04 DIAGNOSIS — B372 Candidiasis of skin and nail: Secondary | ICD-10-CM | POA: Diagnosis not present

## 2017-06-11 ENCOUNTER — Ambulatory Visit (INDEPENDENT_AMBULATORY_CARE_PROVIDER_SITE_OTHER): Payer: Medicare Other

## 2017-06-11 ENCOUNTER — Ambulatory Visit (INDEPENDENT_AMBULATORY_CARE_PROVIDER_SITE_OTHER): Payer: Medicare Other | Admitting: Obstetrics and Gynecology

## 2017-06-11 DIAGNOSIS — N950 Postmenopausal bleeding: Secondary | ICD-10-CM | POA: Diagnosis not present

## 2017-06-16 NOTE — Progress Notes (Signed)
Gynecology Ultrasound Follow Up  Chief Complaint: No chief complaint on file.   History of Present Illness: Patient is a 84 y.o. female who presents today for ultrasound evaluation of postmenopausal bleeding, no further bleeding noted in the interim since her last visit.  Ultrasound demonstrates the following findgins Adnexa:  No adnexal masses or pathology Uterus:  Surgically absent Additional: + Free fluid  Review of Systems: Review of Systems  Constitutional: Negative.   Gastrointestinal: Negative.   Genitourinary: Negative.     Past Medical History:  Past Medical History:  Diagnosis Date  . Arthritis   . Bleeding disorder ([place])   . Chronic cystitis   . Collagen vascular disease ([place])   . CVA (cerebral infarction) 2003  . Dysuria   . Gross hematuria   . Hematuria   . Hemihypertrophy   . History of kidney stones   . Hyperlipidemia   . Hypertension   . Murmur, cardiac   . Myocardial infarction ([place])   . Peripheral vascular disease ([place])    s/p stent right leg  . Pulmonary nodule    stable on Chest CT March 2014, Followed at [facility]  . Recurrent UTI   . Sensory urge incontinence   . Thyroid disease   . Urinary frequency     Past Surgical History:  Past Surgical History:  Procedure Laterality Date  . ABDOMINAL HYSTERECTOMY  1982  . APPENDECTOMY    . CARDIAC SURGERY     2 failed stents in right leg  . CARPAL TUNNEL RELEASE    . HIP SURGERY  2007   replaced ball in right hip  . left rotator cuff     repair  . ruptured disc    . SPINE SURGERY     steel rod in back  . stent placement leg Right 2015  . TONSILLECTOMY    . TOTAL HIP ARTHROPLASTY     right  . TOTAL KNEE ARTHROPLASTY Left     Gynecologic History:  No LMP recorded. Patient has had a hysterectomy.  Family History:  Family History  Problem Relation Age of Onset  . Hypertension Mother   . Transient ischemic attack Mother   . Kidney disease Neg Hx   . Bladder Cancer Neg Hx   . Prostate  cancer Neg Hx     Social History:  Social History   Socioeconomic History  . Marital status: Widowed    Spouse name: Not on file  . Number of children: 3  . Years of education: Not on file  . Highest education level: Not on file  Occupational History  . Not on file  Social Needs  . Financial resource strain: Not on file  . Food insecurity:    Worry: Not on file    Inability: Not on file  . Transportation needs:    Medical: Not on file    Non-medical: Not on file  Tobacco Use  . Smoking status: Never Smoker  . Smokeless tobacco: Never Used  Substance and Sexual Activity  . Alcohol use: No  . Drug use: No  . Sexual activity: Not on file  Lifestyle  . Physical activity:    Days per week: Not on file    Minutes per session: Not on file  . Stress: Not on file  Relationships  . Social connections:    Talks on phone: Not on file    Gets together: Not on file    Attends religious service: Not on file  Active member of club or organization: Not on file    Attends meetings of clubs or organizations: Not on file    Relationship status: Not on file  . Intimate partner violence:    Fear of current or ex partner: Not on file    Emotionally abused: Not on file    Physically abused: Not on file    Forced sexual activity: Not on file  Other Topics Concern  . Not on file  Social History Narrative   Lives at home with caregiver.   Caffeine use: Drink 1/2 cup coffee at breakfast    Allergies:  Allergies  Allergen Reactions  . Sulfa Antibiotics Hives and Itching  . Ciprofloxacin Hives and Itching  . Penicillins Hives and Itching    .Has patient had a PCN reaction causing immediate rash, facial/tongue/throat swelling, SOB or lightheadedness with hypotension: Unknown Has patient had a PCN reaction causing severe rash involving mucus membranes or skin necrosis: Unknown Has patient had a PCN reaction that required hospitalization: Unknown Has patient had a PCN reaction  occurring within the last 10 years: Unknown If all of the above answers are "NO", then may proceed with Cephalosporin use.     Medications: Prior to Admission medications   Medication Sig Start Date End Date Taking? Authorizing Provider  amLODipine (NORVASC) 5 MG tablet Take 5 mg by mouth daily.    [provider]  apixaban (ELIQUIS) 5 MG TABS tablet Two tablets twice a day for six days then one tablet twice a day afterwards Patient taking differently: Take 5 mg by mouth 2 (two) times daily.  01/27/16   Loletha Grayer, MD  Ascorbic Acid (VITAMIN C) 1000 MG tablet Take 1 tablet (1,000 mg total) by mouth daily. 02/13/16   Zara Council A, PA-C  bisacodyl (DULCOLAX) 10 MG suppository Place 10 mg rectally daily as needed for moderate constipation.    [provider]  cetirizine (ZYRTEC) 10 MG tablet Take 10 mg by mouth daily.    [provider]  Cholecalciferol (VITAMIN D3) 50000 units CAPS Take 50,000 Units by mouth every 30 (thirty) days. Patient takes on the 13th    [provider]  Cranberry 425 MG CAPS Take 425 mg by mouth daily.    [provider]  cyanocobalamin (,VITAMIN B-12,) 1000 MCG/ML injection Inject 1,000 mcg into the muscle every 30 (thirty) days. On the 2nd of each month    [provider]  dextromethorphan-guaiFENesin (ROBITUSSIN-DM) 10-100 MG/5ML liquid Take 10 mLs by mouth every 4 (four) hours as needed for cough.    [provider]  hydrOXYzine (ATARAX/VISTARIL) 25 MG tablet Take 12.5 mg by mouth 3 (three) times daily as needed for itching.    [provider]  levofloxacin (LEVAQUIN) 500 MG tablet Take 1 tablet (500 mg total) by mouth daily. 08/16/16   Hillary Bow, MD  levothyroxine (SYNTHROID, LEVOTHROID) 25 MCG tablet TAKE 1 TABLET (25 MCG TOTAL) BY MOUTH DAILY. Patient taking differently: Take 25 mcg by mouth daily.  07/04/14   Jackolyn Confer, MD  magnesium hydroxide (MILK OF MAGNESIA) 400 MG/5ML  suspension Take 30 mLs by mouth daily as needed for mild constipation or moderate constipation.    [provider]  menthol-cetylpyridinium (CEPACOL) 3 MG lozenge Take 1 lozenge by mouth as needed for sore throat.    [provider]  metoprolol succinate (TOPROL-XL) 25 MG 24 hr tablet Take 12.5 mg by mouth daily.     [provider]  omeprazole ([place])  20 MG capsule Take 1 capsule (20 mg total) by mouth 2 (two) times daily before a meal. 05/15/16   Sudini, Alveta Heimlich, MD  ondansetron (ZOFRAN) 4 MG tablet Take 4 mg by mouth every 8 (eight) hours as needed for nausea or vomiting.    [provider]  phenazopyridine (PYRIDIUM) 100 MG tablet Take 100 mg by mouth 2 (two) times daily as needed for pain.    [provider]  polyethylene glycol (MIRALAX / GLYCOLAX) packet Take 17 g by mouth daily as needed for mild constipation, moderate constipation or severe constipation. Reported on 02/27/2015    [provider]  pravastatin (PRAVACHOL) 40 MG tablet Take 1 tablet (40 mg total) by mouth every evening. 07/04/14   Jackolyn Confer, MD  Probiotic Product (ALIGN) 4 MG CAPS Take 1 capsule by mouth daily.    [provider]  senna (SENOKOT) 8.6 MG TABS tablet Take 2 tablets by mouth daily.    [provider]  sodium chloride (OCEAN) 0.65 % SOLN nasal spray Place 1 spray into both nostrils every 2 (two) hours as needed for congestion.    [provider]  sucralfate (CARAFATE) 1 GM/10ML suspension Take 10 mLs (1 g total) by mouth 4 (four) times daily. 04/16/17   Paulette Blanch, MD  traMADol (ULTRAM) 50 MG tablet Take 0.5 tablets (25 mg total) by mouth every 8 (eight) hours as needed for moderate pain. 01/27/16   Loletha Grayer, MD    Physical Exam Vitals: There were no vitals taken for this visit.  General: NAD HEENT: normocephalic, anicteric Pulmonary: No increased work of breathing Extremities: no edema, erythema, or  tenderness Neurologic: Grossly intact, normal gait Psychiatric: mood appropriate, affect full  US Pelvis Complete  Result Date: 06/12/2017 Patient Name: GAILYN CROOK DOB: Jan 26, 1930 MRN: [id number] ULTRASOUND REPORT Location: [facility] Date of Service: 06/11/2017 Indications:PMB Findings: No uterus is seen, Right Ovary and Left Ovary not seen. (total hysterectomy) Vaginal cuff is seen There is a free fluid in the cul de sac, 5.96x4.24cm Impression: 1. free fluid in the cul de sac  5.96x4.24cm 2. No uterus , Rt ovary nor Lt ovary (Total hysterectomy) Recommendations: 1.Clinical correlation with the patient's History and Physical Exam. Abeer Alsammarraie RDMS Images reviewed.  Normal GYN study without visualized pathology.  Malachy Mood, MD, [facility], [facility] 06/12/2017, 9:09 AM     Assessment: 84 y.o. follow up postmenopausal bleeding  Plan: Problem List Items Addressed This Visit    None    Visit Diagnoses    Postmenopausal bleeding    -  Primary      1) PMB - the patient does appears to have a normal gyn ultrasound today.  We did not demonstrate a vaginal source of bleeding at her last visit, and she is status post prior hysterectomy.  My suggestion to the patient and her daughter would be to represent during and actual bleeding episode to more reliable determine that cause ans urinary or GI tract bleeding may also be implicated.    2) A total of 15 minutes were spent in face-to-face contact with the patient during this encounter with over half of that time devoted to counseling and coordination of care.  3) Return if symptoms worsen or fail to improve.   Malachy Mood, MD, [facility], [place]

## 2017-06-22 DIAGNOSIS — L304 Erythema intertrigo: Secondary | ICD-10-CM | POA: Diagnosis not present

## 2017-06-22 DIAGNOSIS — L821 Other seborrheic keratosis: Secondary | ICD-10-CM | POA: Diagnosis not present

## 2017-06-22 DIAGNOSIS — B372 Candidiasis of skin and nail: Secondary | ICD-10-CM | POA: Diagnosis not present

## 2017-06-22 DIAGNOSIS — R208 Other disturbances of skin sensation: Secondary | ICD-10-CM | POA: Diagnosis not present

## 2017-06-22 DIAGNOSIS — L820 Inflamed seborrheic keratosis: Secondary | ICD-10-CM | POA: Diagnosis not present

## 2017-07-02 DIAGNOSIS — I6529 Occlusion and stenosis of unspecified carotid artery: Secondary | ICD-10-CM | POA: Diagnosis not present

## 2017-07-02 DIAGNOSIS — K219 Gastro-esophageal reflux disease without esophagitis: Secondary | ICD-10-CM | POA: Diagnosis not present

## 2017-07-02 DIAGNOSIS — I2699 Other pulmonary embolism without acute cor pulmonale: Secondary | ICD-10-CM | POA: Diagnosis not present

## 2017-07-02 DIAGNOSIS — I739 Peripheral vascular disease, unspecified: Secondary | ICD-10-CM | POA: Diagnosis not present

## 2017-07-02 DIAGNOSIS — G301 Alzheimer's disease with late onset: Secondary | ICD-10-CM | POA: Diagnosis not present

## 2017-08-03 DIAGNOSIS — L304 Erythema intertrigo: Secondary | ICD-10-CM | POA: Diagnosis not present

## 2017-08-03 DIAGNOSIS — L0100 Impetigo, unspecified: Secondary | ICD-10-CM | POA: Diagnosis not present

## 2017-08-31 DIAGNOSIS — L304 Erythema intertrigo: Secondary | ICD-10-CM | POA: Diagnosis not present

## 2017-08-31 DIAGNOSIS — L821 Other seborrheic keratosis: Secondary | ICD-10-CM | POA: Diagnosis not present

## 2017-09-11 DIAGNOSIS — I10 Essential (primary) hypertension: Secondary | ICD-10-CM | POA: Diagnosis not present

## 2017-09-11 DIAGNOSIS — I739 Peripheral vascular disease, unspecified: Secondary | ICD-10-CM

## 2017-09-11 DIAGNOSIS — E039 Hypothyroidism, unspecified: Secondary | ICD-10-CM | POA: Diagnosis not present

## 2017-09-11 DIAGNOSIS — I6529 Occlusion and stenosis of unspecified carotid artery: Secondary | ICD-10-CM | POA: Diagnosis not present

## 2017-09-11 DIAGNOSIS — K219 Gastro-esophageal reflux disease without esophagitis: Secondary | ICD-10-CM | POA: Diagnosis not present

## 2017-09-11 DIAGNOSIS — Z87440 Personal history of urinary (tract) infections: Secondary | ICD-10-CM | POA: Diagnosis not present

## 2017-09-11 DIAGNOSIS — I2699 Other pulmonary embolism without acute cor pulmonale: Secondary | ICD-10-CM | POA: Diagnosis not present

## 2017-09-11 DIAGNOSIS — G309 Alzheimer's disease, unspecified: Secondary | ICD-10-CM | POA: Diagnosis not present

## 2017-09-17 DIAGNOSIS — R278 Other lack of coordination: Secondary | ICD-10-CM | POA: Diagnosis not present

## 2017-09-17 DIAGNOSIS — M6281 Muscle weakness (generalized): Secondary | ICD-10-CM | POA: Diagnosis not present

## 2017-09-17 DIAGNOSIS — R262 Difficulty in walking, not elsewhere classified: Secondary | ICD-10-CM | POA: Diagnosis not present

## 2017-09-18 DIAGNOSIS — R278 Other lack of coordination: Secondary | ICD-10-CM | POA: Diagnosis not present

## 2017-09-18 DIAGNOSIS — M6281 Muscle weakness (generalized): Secondary | ICD-10-CM | POA: Diagnosis not present

## 2017-09-18 DIAGNOSIS — R262 Difficulty in walking, not elsewhere classified: Secondary | ICD-10-CM | POA: Diagnosis not present

## 2017-09-21 DIAGNOSIS — Z8673 Personal history of transient ischemic attack (TIA), and cerebral infarction without residual deficits: Secondary | ICD-10-CM | POA: Diagnosis not present

## 2017-09-21 DIAGNOSIS — R278 Other lack of coordination: Secondary | ICD-10-CM | POA: Diagnosis not present

## 2017-09-21 DIAGNOSIS — M6281 Muscle weakness (generalized): Secondary | ICD-10-CM | POA: Diagnosis not present

## 2017-09-21 DIAGNOSIS — R262 Difficulty in walking, not elsewhere classified: Secondary | ICD-10-CM | POA: Diagnosis not present

## 2017-09-22 DIAGNOSIS — R262 Difficulty in walking, not elsewhere classified: Secondary | ICD-10-CM | POA: Diagnosis not present

## 2017-09-22 DIAGNOSIS — R278 Other lack of coordination: Secondary | ICD-10-CM | POA: Diagnosis not present

## 2017-09-22 DIAGNOSIS — M6281 Muscle weakness (generalized): Secondary | ICD-10-CM | POA: Diagnosis not present

## 2017-09-22 DIAGNOSIS — Z8673 Personal history of transient ischemic attack (TIA), and cerebral infarction without residual deficits: Secondary | ICD-10-CM | POA: Diagnosis not present

## 2017-09-23 DIAGNOSIS — R278 Other lack of coordination: Secondary | ICD-10-CM | POA: Diagnosis not present

## 2017-09-23 DIAGNOSIS — M6281 Muscle weakness (generalized): Secondary | ICD-10-CM | POA: Diagnosis not present

## 2017-09-23 DIAGNOSIS — R262 Difficulty in walking, not elsewhere classified: Secondary | ICD-10-CM | POA: Diagnosis not present

## 2017-09-23 DIAGNOSIS — Z8673 Personal history of transient ischemic attack (TIA), and cerebral infarction without residual deficits: Secondary | ICD-10-CM | POA: Diagnosis not present

## 2017-09-24 DIAGNOSIS — R262 Difficulty in walking, not elsewhere classified: Secondary | ICD-10-CM | POA: Diagnosis not present

## 2017-09-24 DIAGNOSIS — R278 Other lack of coordination: Secondary | ICD-10-CM | POA: Diagnosis not present

## 2017-09-24 DIAGNOSIS — Z8673 Personal history of transient ischemic attack (TIA), and cerebral infarction without residual deficits: Secondary | ICD-10-CM | POA: Diagnosis not present

## 2017-09-24 DIAGNOSIS — M6281 Muscle weakness (generalized): Secondary | ICD-10-CM | POA: Diagnosis not present

## 2017-09-25 DIAGNOSIS — R262 Difficulty in walking, not elsewhere classified: Secondary | ICD-10-CM | POA: Diagnosis not present

## 2017-09-25 DIAGNOSIS — R278 Other lack of coordination: Secondary | ICD-10-CM | POA: Diagnosis not present

## 2017-09-25 DIAGNOSIS — M6281 Muscle weakness (generalized): Secondary | ICD-10-CM | POA: Diagnosis not present

## 2017-09-25 DIAGNOSIS — Z8673 Personal history of transient ischemic attack (TIA), and cerebral infarction without residual deficits: Secondary | ICD-10-CM | POA: Diagnosis not present

## 2017-09-28 DIAGNOSIS — Z8673 Personal history of transient ischemic attack (TIA), and cerebral infarction without residual deficits: Secondary | ICD-10-CM | POA: Diagnosis not present

## 2017-09-28 DIAGNOSIS — R262 Difficulty in walking, not elsewhere classified: Secondary | ICD-10-CM | POA: Diagnosis not present

## 2017-09-28 DIAGNOSIS — R278 Other lack of coordination: Secondary | ICD-10-CM | POA: Diagnosis not present

## 2017-09-28 DIAGNOSIS — M6281 Muscle weakness (generalized): Secondary | ICD-10-CM | POA: Diagnosis not present

## 2017-09-30 DIAGNOSIS — R278 Other lack of coordination: Secondary | ICD-10-CM | POA: Diagnosis not present

## 2017-09-30 DIAGNOSIS — R262 Difficulty in walking, not elsewhere classified: Secondary | ICD-10-CM | POA: Diagnosis not present

## 2017-09-30 DIAGNOSIS — M6281 Muscle weakness (generalized): Secondary | ICD-10-CM | POA: Diagnosis not present

## 2017-09-30 DIAGNOSIS — Z8673 Personal history of transient ischemic attack (TIA), and cerebral infarction without residual deficits: Secondary | ICD-10-CM | POA: Diagnosis not present

## 2017-10-01 DIAGNOSIS — M6281 Muscle weakness (generalized): Secondary | ICD-10-CM | POA: Diagnosis not present

## 2017-10-01 DIAGNOSIS — Z8673 Personal history of transient ischemic attack (TIA), and cerebral infarction without residual deficits: Secondary | ICD-10-CM | POA: Diagnosis not present

## 2017-10-01 DIAGNOSIS — R278 Other lack of coordination: Secondary | ICD-10-CM | POA: Diagnosis not present

## 2017-10-01 DIAGNOSIS — R262 Difficulty in walking, not elsewhere classified: Secondary | ICD-10-CM | POA: Diagnosis not present

## 2017-10-02 DIAGNOSIS — Z8673 Personal history of transient ischemic attack (TIA), and cerebral infarction without residual deficits: Secondary | ICD-10-CM | POA: Diagnosis not present

## 2017-10-02 DIAGNOSIS — M6281 Muscle weakness (generalized): Secondary | ICD-10-CM | POA: Diagnosis not present

## 2017-10-02 DIAGNOSIS — R278 Other lack of coordination: Secondary | ICD-10-CM | POA: Diagnosis not present

## 2017-10-02 DIAGNOSIS — R262 Difficulty in walking, not elsewhere classified: Secondary | ICD-10-CM | POA: Diagnosis not present

## 2017-10-05 DIAGNOSIS — R262 Difficulty in walking, not elsewhere classified: Secondary | ICD-10-CM | POA: Diagnosis not present

## 2017-10-05 DIAGNOSIS — Z8673 Personal history of transient ischemic attack (TIA), and cerebral infarction without residual deficits: Secondary | ICD-10-CM | POA: Diagnosis not present

## 2017-10-05 DIAGNOSIS — M6281 Muscle weakness (generalized): Secondary | ICD-10-CM | POA: Diagnosis not present

## 2017-10-05 DIAGNOSIS — R278 Other lack of coordination: Secondary | ICD-10-CM | POA: Diagnosis not present

## 2017-10-06 DIAGNOSIS — R278 Other lack of coordination: Secondary | ICD-10-CM | POA: Diagnosis not present

## 2017-10-06 DIAGNOSIS — R262 Difficulty in walking, not elsewhere classified: Secondary | ICD-10-CM | POA: Diagnosis not present

## 2017-10-06 DIAGNOSIS — M6281 Muscle weakness (generalized): Secondary | ICD-10-CM | POA: Diagnosis not present

## 2017-10-06 DIAGNOSIS — Z8673 Personal history of transient ischemic attack (TIA), and cerebral infarction without residual deficits: Secondary | ICD-10-CM | POA: Diagnosis not present

## 2017-10-07 DIAGNOSIS — M6281 Muscle weakness (generalized): Secondary | ICD-10-CM | POA: Diagnosis not present

## 2017-10-07 DIAGNOSIS — R278 Other lack of coordination: Secondary | ICD-10-CM | POA: Diagnosis not present

## 2017-10-07 DIAGNOSIS — Z8673 Personal history of transient ischemic attack (TIA), and cerebral infarction without residual deficits: Secondary | ICD-10-CM | POA: Diagnosis not present

## 2017-10-07 DIAGNOSIS — R262 Difficulty in walking, not elsewhere classified: Secondary | ICD-10-CM | POA: Diagnosis not present

## 2017-10-08 DIAGNOSIS — R262 Difficulty in walking, not elsewhere classified: Secondary | ICD-10-CM | POA: Diagnosis not present

## 2017-10-08 DIAGNOSIS — Z8673 Personal history of transient ischemic attack (TIA), and cerebral infarction without residual deficits: Secondary | ICD-10-CM | POA: Diagnosis not present

## 2017-10-08 DIAGNOSIS — M6281 Muscle weakness (generalized): Secondary | ICD-10-CM | POA: Diagnosis not present

## 2017-10-08 DIAGNOSIS — R278 Other lack of coordination: Secondary | ICD-10-CM | POA: Diagnosis not present

## 2017-10-12 DIAGNOSIS — M6281 Muscle weakness (generalized): Secondary | ICD-10-CM | POA: Diagnosis not present

## 2017-10-12 DIAGNOSIS — R278 Other lack of coordination: Secondary | ICD-10-CM | POA: Diagnosis not present

## 2017-10-12 DIAGNOSIS — R262 Difficulty in walking, not elsewhere classified: Secondary | ICD-10-CM | POA: Diagnosis not present

## 2017-10-12 DIAGNOSIS — Z8673 Personal history of transient ischemic attack (TIA), and cerebral infarction without residual deficits: Secondary | ICD-10-CM | POA: Diagnosis not present

## 2017-10-13 DIAGNOSIS — R262 Difficulty in walking, not elsewhere classified: Secondary | ICD-10-CM | POA: Diagnosis not present

## 2017-10-13 DIAGNOSIS — R278 Other lack of coordination: Secondary | ICD-10-CM | POA: Diagnosis not present

## 2017-10-13 DIAGNOSIS — M6281 Muscle weakness (generalized): Secondary | ICD-10-CM | POA: Diagnosis not present

## 2017-10-13 DIAGNOSIS — Z8673 Personal history of transient ischemic attack (TIA), and cerebral infarction without residual deficits: Secondary | ICD-10-CM | POA: Diagnosis not present

## 2017-11-12 DIAGNOSIS — I739 Peripheral vascular disease, unspecified: Secondary | ICD-10-CM | POA: Diagnosis not present

## 2017-11-12 DIAGNOSIS — G301 Alzheimer's disease with late onset: Secondary | ICD-10-CM | POA: Diagnosis not present

## 2017-11-12 DIAGNOSIS — I10 Essential (primary) hypertension: Secondary | ICD-10-CM | POA: Diagnosis not present

## 2017-11-12 DIAGNOSIS — I6529 Occlusion and stenosis of unspecified carotid artery: Secondary | ICD-10-CM | POA: Diagnosis not present

## 2017-11-12 DIAGNOSIS — I2782 Chronic pulmonary embolism: Secondary | ICD-10-CM | POA: Diagnosis not present

## 2017-12-02 DIAGNOSIS — L309 Dermatitis, unspecified: Secondary | ICD-10-CM | POA: Diagnosis not present

## 2017-12-02 DIAGNOSIS — L304 Erythema intertrigo: Secondary | ICD-10-CM | POA: Diagnosis not present

## 2017-12-02 DIAGNOSIS — E785 Hyperlipidemia, unspecified: Secondary | ICD-10-CM | POA: Diagnosis not present

## 2017-12-02 DIAGNOSIS — I10 Essential (primary) hypertension: Secondary | ICD-10-CM | POA: Diagnosis not present

## 2017-12-02 DIAGNOSIS — L0100 Impetigo, unspecified: Secondary | ICD-10-CM | POA: Diagnosis not present

## 2017-12-02 DIAGNOSIS — E039 Hypothyroidism, unspecified: Secondary | ICD-10-CM | POA: Diagnosis not present

## 2017-12-11 DIAGNOSIS — B351 Tinea unguium: Secondary | ICD-10-CM

## 2018-01-08 DIAGNOSIS — N390 Urinary tract infection, site not specified: Secondary | ICD-10-CM | POA: Diagnosis not present

## 2018-01-08 DIAGNOSIS — I6529 Occlusion and stenosis of unspecified carotid artery: Secondary | ICD-10-CM | POA: Diagnosis not present

## 2018-01-08 DIAGNOSIS — I10 Essential (primary) hypertension: Secondary | ICD-10-CM | POA: Diagnosis not present

## 2018-01-08 DIAGNOSIS — E039 Hypothyroidism, unspecified: Secondary | ICD-10-CM | POA: Diagnosis not present

## 2018-01-08 DIAGNOSIS — I2699 Other pulmonary embolism without acute cor pulmonale: Secondary | ICD-10-CM | POA: Diagnosis not present

## 2018-01-08 DIAGNOSIS — K219 Gastro-esophageal reflux disease without esophagitis: Secondary | ICD-10-CM | POA: Diagnosis not present

## 2018-01-08 DIAGNOSIS — G309 Alzheimer's disease, unspecified: Secondary | ICD-10-CM | POA: Diagnosis not present

## 2018-01-08 DIAGNOSIS — I739 Peripheral vascular disease, unspecified: Secondary | ICD-10-CM | POA: Diagnosis not present

## 2018-01-26 NOTE — Progress Notes (Signed)
01/27/2018 10:59 AM   Krista Ellis 05-17-1930 [id number]  Referring provider: Venia Carbon, MD [address] [place], [place] [address]  Chief Complaint  Patient presents with  . Follow-up    HPI: Patient is an 85 -year-old Caucasian female with a history of microscopic hematuria, dysuria, atrophic vaginitis, recurrent UTI's and nephrolithiasis who presents with the complaint of passage of blood clot with her daughter, Krista Ellis.  Background history Patient with microscopic hematuria and suprapubic pain with a negative urine culture by [facility] emergency room.  Patient presented to the emergency room on 02/26/2015 complaining of dysuria and pain during urination. He was not having gross hematuria at that time. She also denied fevers, chills, nausea or vomiting. Her urinalysis demonstrated too numerous to count RBC's per high-power field. The urine culture was negative. Patient had a CT renal stone study on 12/28/2014 which noted a 4 mm nonobstructing left renal calculus. No evidence of ureteral calculi, hydronephrosis or other acute findings. She then had a CT scan of the abdomen and pelvis with contrast on 02/26/2015 which again identified the nonobstructing stone in the left kidney and bilateral renal cysts. She does not endorse gross hematuria. She does complain of frequent urination, dysuria, urgency, nocturia, incontinence, intermittency and urinary tract infections. She also complains of bladder spasms. She is not experiencing fevers, chills, nausea or vomiting.  She had a urine culture and forward 24th 2017 that showed Escherichia coli was pansensitive except for intermittent resistance to cephalothin.  The patient underwent cystoscopy today which was normal to vision was somewhat limited by concurrent use of uribel.  She she has been on uribel which is significantly improved her dysuria. She is also treated recently for urinary tract infection is  currently taking Macrobid.  Urinary Urgency Background History:  Today, she is complaining of frequency x 7, incontinence x 7 and nocturia x 3.  She is currently on oxybutynin 2.5 mg daily.  She is not complaining of vaginal burning at this time.  She was recently seen in the ED for a pulmonary emboism and the vaginal estrogen cream had to be discontinued.  She denies gross hematuria, suprapubic pain and dysuria.  She is not having fevers, chills, nausea or vomiting.   Urinary Urgency:  The patient is  experiencing urgency x 4-7 (previously x 7), frequency x 4-7, she is not restricting fluids to avoid visits to the restroom, not engaging in toilet mapping, incontinence x 4-7 (previously x 7) and nocturia x 0-3 (previously x 3). Her BP is 117/72. Her PVR is 143mL.     Gross Hematuria:  She notes that she passed a clot in December 2019, however, she didn't have any pain at the time. She had a weird discomfort at the time of the urination. Daughter noted that 1 year ago that the patient had an occurrence of blood noted through her stool. She had a partial hysterectomy. She denies hematuria and any other symptoms. She has followed up with GYN specialist, Dr. Georgianne Fick for PMB with a negative workup. Dr. Georgianne Fick advised that the patient follow up with either GI or Urology.  CATH UA was positive for nitrite positive, > 30 WBC's and many bacteria.     PMH: Past Medical History:  Diagnosis Date  . Arthritis   . Bleeding disorder ([place])   . Chronic cystitis   . Collagen vascular disease ([place])   . CVA (cerebral infarction) 2003  . Dysuria   . Gross hematuria   .  Hematuria   . Hemihypertrophy   . History of kidney stones   . Hyperlipidemia   . Hypertension   . Murmur, cardiac   . Myocardial infarction ([place])   . Peripheral vascular disease ([place])    s/p stent right leg  . Pulmonary nodule    stable on Chest CT March 2014, Followed at [facility]  . Recurrent UTI   . Sensory urge incontinence   .  Thyroid disease   . Urinary frequency     Surgical History: Past Surgical History:  Procedure Laterality Date  . ABDOMINAL HYSTERECTOMY  1982  . APPENDECTOMY    . CARDIAC SURGERY     2 failed stents in right leg  . CARPAL TUNNEL RELEASE    . HIP SURGERY  2007   replaced ball in right hip  . left rotator cuff     repair  . ruptured disc    . SPINE SURGERY     steel rod in back  . stent placement leg Right 2015  . TONSILLECTOMY    . TOTAL HIP ARTHROPLASTY     right  . TOTAL KNEE ARTHROPLASTY Left     Home Medications:  Allergies as of 01/27/2018      Reactions   Sulfa Antibiotics Hives, Itching   Ciprofloxacin Hives, Itching   Penicillins Hives, Itching   .Has patient had a PCN reaction causing immediate rash, facial/tongue/throat swelling, SOB or lightheadedness with hypotension: Unknown Has patient had a PCN reaction causing severe rash involving mucus membranes or skin necrosis: Unknown Has patient had a PCN reaction that required hospitalization: Unknown Has patient had a PCN reaction occurring within the last 10 years: Unknown If all of the above answers are "NO", then may proceed with Cephalosporin use.      Medication List       Accurate as of January 27, 2018 10:59 AM. Always use your most recent med list.        ALIGN 4 MG Caps Take 1 capsule by mouth daily.   amLODipine 5 MG tablet Commonly known as:  NORVASC Take 5 mg by mouth daily.   apixaban 5 MG Tabs tablet Commonly known as:  ELIQUIS Two tablets twice a day for six days then one tablet twice a day afterwards   bisacodyl 10 MG suppository Commonly known as:  DULCOLAX Place 10 mg rectally daily as needed for moderate constipation.   cetirizine 10 MG tablet Commonly known as:  ZYRTEC Take 10 mg by mouth daily.   Cranberry 425 MG Caps Take 425 mg by mouth daily.   cyanocobalamin 1000 MCG/ML injection Commonly known as:  (VITAMIN B-12) Inject 1,000 mcg into the muscle every 30 (thirty) days.  On the 2nd of each month   dextromethorphan-guaiFENesin 10-100 MG/5ML liquid Commonly known as:  ROBITUSSIN-DM Take 10 mLs by mouth every 4 (four) hours as needed for cough.   hydrOXYzine 25 MG tablet Commonly known as:  ATARAX/VISTARIL Take 12.5 mg by mouth 3 (three) times daily as needed for itching.   levofloxacin 500 MG tablet Commonly known as:  LEVAQUIN Take 1 tablet (500 mg total) by mouth daily.   levothyroxine 25 MCG tablet Commonly known as:  SYNTHROID, LEVOTHROID TAKE 1 TABLET (25 MCG TOTAL) BY MOUTH DAILY.   magnesium hydroxide 400 MG/5ML suspension Commonly known as:  MILK OF MAGNESIA Take 30 mLs by mouth daily as needed for mild constipation or moderate constipation.   menthol-cetylpyridinium 3 MG lozenge Commonly known as:  CEPACOL Take 1 lozenge  by mouth as needed for sore throat.   metoprolol succinate 25 MG 24 hr tablet Commonly known as:  TOPROL-XL Take 12.5 mg by mouth daily.   omeprazole 20 MG capsule Commonly known as:  PRILOSEC Take 1 capsule (20 mg total) by mouth 2 (two) times daily before a meal.   phenazopyridine 100 MG tablet Commonly known as:  PYRIDIUM Take 100 mg by mouth 2 (two) times daily as needed for pain.   polyethylene glycol packet Commonly known as:  MIRALAX / GLYCOLAX Take 17 g by mouth daily as needed for mild constipation, moderate constipation or severe constipation. Reported on 02/27/2015   pravastatin 40 MG tablet Commonly known as:  PRAVACHOL Take 1 tablet (40 mg total) by mouth every evening.   senna 8.6 MG Tabs tablet Commonly known as:  SENOKOT Take 2 tablets by mouth daily.   sodium chloride 0.65 % Soln nasal spray Commonly known as:  OCEAN Place 1 spray into both nostrils every 2 (two) hours as needed for congestion.   sucralfate 1 GM/10ML suspension Commonly known as:  CARAFATE Take 10 mLs (1 g total) by mouth 4 (four) times daily.   vitamin C 1000 MG tablet Take 1 tablet (1,000 mg total) by mouth daily.     Vitamin D3 1.25 MG (50000 UT) Caps Take 50,000 Units by mouth every 30 (thirty) days. Patient takes on the 13th       Allergies:  Allergies  Allergen Reactions  . Sulfa Antibiotics Hives and Itching  . Ciprofloxacin Hives and Itching  . Penicillins Hives and Itching    .Has patient had a PCN reaction causing immediate rash, facial/tongue/throat swelling, SOB or lightheadedness with hypotension: Unknown Has patient had a PCN reaction causing severe rash involving mucus membranes or skin necrosis: Unknown Has patient had a PCN reaction that required hospitalization: Unknown Has patient had a PCN reaction occurring within the last 10 years: Unknown If all of the above answers are "NO", then may proceed with Cephalosporin use.     Family History: Family History  Problem Relation Age of Onset  . Hypertension Mother   . Transient ischemic attack Mother   . Kidney disease Neg Hx   . Bladder Cancer Neg Hx   . Prostate cancer Neg Hx     Social History:  reports that she has never smoked. She has never used smokeless tobacco. She reports that she does not drink alcohol or use drugs.  ROS: UROLOGY Frequent Urination?: Yes Hard to postpone urination?: No Burning/pain with urination?: No Get up at night to urinate?: No Leakage of urine?: No Urine stream starts and stops?: No Trouble starting stream?: No Do you have to strain to urinate?: No Blood in urine?: No Urinary tract infection?: No Sexually transmitted disease?: No Injury to kidneys or bladder?: No Painful intercourse?: No Weak stream?: No Currently pregnant?: No Vaginal bleeding?: No Last menstrual period?: n  Gastrointestinal Nausea?: No Vomiting?: No Indigestion/heartburn?: No Diarrhea?: No Constipation?: No  Constitutional Fever: No Night sweats?: No Weight loss?: No Fatigue?: No  Skin Skin rash/lesions?: Yes Itching?: No  Eyes Blurred vision?: No Double vision?: No  Ears/Nose/Throat Sore  throat?: No Sinus problems?: Yes  Hematologic/Lymphatic Swollen glands?: No Easy bruising?: No  Cardiovascular Leg swelling?: No Chest pain?: No  Respiratory Cough?: No Shortness of breath?: No  Endocrine Excessive thirst?: No  Musculoskeletal Back pain?: No Joint pain?: Yes  Neurological Headaches?: No Dizziness?: No  Psychologic Depression?: No Anxiety?: No  Physical Exam: BP 117/72  Pulse 77   Ht 5\' 5"  (1.651 m)   Wt 112 lb (50.8 kg)   BMI 18.64 kg/m   Constitutional:  Well nourished. Alert and oriented, No acute distress. HEENT: [place] AT, moist mucus membranes.  Trachea midline, no masses. Cardiovascular: No clubbing, cyanosis, or edema. Respiratory: Normal respiratory effort, no increased work of breathing. GI: Abdomen is soft, non tender, non distended, no abdominal masses. Liver and spleen not palpable.  No hernias appreciated.  Stool sample for occult testing is not indicated.   GU: No CVA tenderness.  No bladder fullness or masses.  Atrophic external genitalia, sparse pubic hair distribution, no lesions.  Normal urethral meatus, no lesions, no prolapse, no discharge.   No urethral masses, tenderness and/or tenderness. No bladder fullness, tenderness or masses. pale vagina mucosa, poor estrogen effect, no discharge, no lesions, good pelvic support, no cystocele and no rectocele noted. Cervix and uterus surgically absent. No adnexal/parametria masses or tenderness noted.  Anus and perineum are without rashes or lesions.    Skin: No rashes, bruises or suspicious lesions. Lymph: No cervical or inguinal adenopathy. Neurologic: Grossly intact, no focal deficits, moving all 4 extremities. Psychiatric: Normal mood and affect.    Laboratory Data: Lab Results  Component Value Date   WBC 13.5 (H) 04/16/2017   HGB 14.1 04/16/2017   HCT 42.8 04/16/2017   MCV 93.9 04/16/2017   PLT 302 04/16/2017    Lab Results  Component Value Date   CREATININE 0.71 04/16/2017     Lab Results  Component Value Date   HGBA1C 5.3 05/13/2016     Pertinent Imaging: Results for GENESIS, NOVOSAD (MRN [id number]) as of 02/13/2016 10:02  Ref. Range 02/13/2016 09:38 01/27/2018  Scan Result Unknown [id number]   In and Out Catheterization Patient is present today for a I & O catheterization due to hx of rUTI's and passed of clot.   Patient was cleaned and prepped in a sterile fashion with betadine and Lidocaine 2% jelly was instilled into the urethra.  A 14 FR cath was inserted no complications were noted , 130 ml of urine return was noted, urine was yellow, cloudy in color. A clean urine sample was collected for UA and urine. Bladder was drained  And catheter was removed with out difficulty.    Preformed by: Zara Council, PA-C   Assessment & Plan:    1. Gross hematuria/clot  -Patient notes that she passed a "clot" in December 2019, however, there is no pain.   -Patient has been seen at [facility] with Dr. Georgianne Fick for post-menopausal bleeding. There was a negative workup and Dr. Georgianne Fick suggested follow up with GI or Urology for further workup.   -Discussed with the patient and her daughter regarding use of cystoscopy and/or [place] of kidneys to determine the source of bleeding. At this time, the patient would like to proceed with a kidney US and omit cystoscopy procedure.  -Kidney US ordered today.   2. Dysuria  - not occurring at this time  3. Atrophic vaginitis  - no longer a candidate for vaginal estrogen cream due to PE  4. Recurrent UTI   - reviewed UTI prevention strategies  -  recommend only treating symptomatic urinary tract infections and recommend against treating asymptomatic bacteriuria  - in and out cath performed in the office today, UA nitrite positive, >30WBC's and many bacteria.    - will not prescribe an antibiotic at this time as she is asymptomatic  5. Nephrolithiasis  - 4  mm left nonobstructing renal calculus. No further workup  needed.  Return for I will call patient with results.  Zara Council, PA-C  [facility] [address] [place], [place], PA-C [place], [place] [address] [phone]  I, Soijett Oren Binet, am acting as a Education administrator for [employer].   I have reviewed the above documentation for accuracy and completeness, and I agree with the above.    Zara Council, PA-C

## 2018-01-27 ENCOUNTER — Ambulatory Visit (INDEPENDENT_AMBULATORY_CARE_PROVIDER_SITE_OTHER): Payer: Medicare Other | Admitting: Urology

## 2018-01-27 ENCOUNTER — Encounter: Payer: Self-pay | Admitting: Urology

## 2018-01-27 VITALS — BP 117/72 | HR 77 | Ht 65.0 in | Wt 112.0 lb

## 2018-01-27 DIAGNOSIS — R310 Gross hematuria: Secondary | ICD-10-CM | POA: Diagnosis not present

## 2018-01-27 DIAGNOSIS — R3129 Other microscopic hematuria: Secondary | ICD-10-CM

## 2018-01-27 DIAGNOSIS — N390 Urinary tract infection, site not specified: Secondary | ICD-10-CM

## 2018-01-27 LAB — MICROSCOPIC EXAMINATION

## 2018-01-27 LAB — URINALYSIS, COMPLETE
BILIRUBIN UA: NEGATIVE
GLUCOSE, UA: NEGATIVE
Ketones, UA: NEGATIVE
NITRITE UA: POSITIVE — AB
SPEC GRAV UA: 1.015 (ref 1.005–1.030)
UUROB: 0.2 mg/dL (ref 0.2–1.0)
pH, UA: 6 (ref 5.0–7.5)

## 2018-01-27 LAB — BLADDER SCAN AMB NON-IMAGING: SCAN RESULT: 133

## 2018-01-31 LAB — CULTURE, URINE COMPREHENSIVE

## 2018-02-01 ENCOUNTER — Telehealth: Payer: Self-pay

## 2018-02-01 MED ORDER — NITROFURANTOIN MONOHYD MACRO 100 MG PO CAPS: 100.0000 mg | ORAL_CAPSULE | Freq: Two times a day (BID) | ORAL | 0 refills | Status: DC

## 2018-02-01 NOTE — Telephone Encounter (Signed)
-----   Message from Nori Riis, PA-C sent at 02/01/2018  8:28 AM EST ----- Please call Krista Ellis's daughter, Gerry< and let her know that her mother's urine culture was positive for infection.   We need to get her started on Macrobid 100 mg, BID x seven days.

## 2018-02-01 NOTE — Telephone Encounter (Signed)
Informed patients daughter of urinary tract infection results.   Rx called into [facility] at [facility].

## 2018-02-03 ENCOUNTER — Ambulatory Visit
Admission: RE | Admit: 2018-02-03 | Discharge: 2018-02-03 | Disposition: A | Discharge status: Home / Self Care | Payer: Medicare Other | Source: Ambulatory Visit | Attending: Urology | Admitting: Urology

## 2018-02-03 DIAGNOSIS — R310 Gross hematuria: Secondary | ICD-10-CM | POA: Insufficient documentation

## 2018-02-03 DIAGNOSIS — N281 Cyst of kidney, acquired: Secondary | ICD-10-CM | POA: Diagnosis not present

## 2018-02-16 NOTE — Telephone Encounter (Signed)
Malachy Mood with [facility] [phone]) called the office.  The patient's daughter told [facility] that we talked to her about an order for lidocaine that we could send to the them to help with vaginal burning.    If we are able to send the order, please Fax # [phone].  If not or if we have any questions, please contact her.

## 2018-02-17 ENCOUNTER — Telehealth: Payer: Self-pay | Admitting: Urology

## 2018-02-17 DIAGNOSIS — N952 Postmenopausal atrophic vaginitis: Secondary | ICD-10-CM

## 2018-02-17 MED ORDER — LIDOCAINE 0.5 % EX GEL: 1.0000 mL | Freq: Three times a day (TID) | CUTANEOUS | 1 refills | Status: DC | PRN

## 2018-02-17 MED ORDER — LIDOCAINE 2 % EX GEL: 1.0000 "application " | Freq: Three times a day (TID) | CUTANEOUS | 0 refills | Status: DC | PRN

## 2018-02-17 NOTE — Telephone Encounter (Signed)
Please advise 

## 2018-02-17 NOTE — Telephone Encounter (Signed)
Called Christa at pt's pharmacy who states that they are unable to get lidocaine in 0.5%, verbal given from Zara Council, PA to change rx to 2% lidocaine gel.

## 2018-02-17 NOTE — Addendum Note (Signed)
Addended by: Zara Council A on: 02/17/2018 10:54 AM   Modules accepted: Orders

## 2018-02-17 NOTE — Telephone Encounter (Signed)
Christa (at pharmacy) needs a call back about Lysine gel.

## 2018-02-17 NOTE — Telephone Encounter (Signed)
That would be fine.  I've printed the script.

## 2018-03-09 DIAGNOSIS — G301 Alzheimer's disease with late onset: Secondary | ICD-10-CM | POA: Diagnosis not present

## 2018-03-09 DIAGNOSIS — I6529 Occlusion and stenosis of unspecified carotid artery: Secondary | ICD-10-CM | POA: Diagnosis not present

## 2018-03-09 DIAGNOSIS — I739 Peripheral vascular disease, unspecified: Secondary | ICD-10-CM | POA: Diagnosis not present

## 2018-03-09 DIAGNOSIS — I2782 Chronic pulmonary embolism: Secondary | ICD-10-CM | POA: Diagnosis not present

## 2018-03-18 DIAGNOSIS — Z872 Personal history of diseases of the skin and subcutaneous tissue: Secondary | ICD-10-CM | POA: Diagnosis not present

## 2018-03-18 DIAGNOSIS — L304 Erythema intertrigo: Secondary | ICD-10-CM | POA: Diagnosis not present

## 2018-05-12 DIAGNOSIS — N390 Urinary tract infection, site not specified: Secondary | ICD-10-CM | POA: Diagnosis not present

## 2018-05-12 DIAGNOSIS — I10 Essential (primary) hypertension: Secondary | ICD-10-CM | POA: Diagnosis not present

## 2018-05-12 DIAGNOSIS — K219 Gastro-esophageal reflux disease without esophagitis: Secondary | ICD-10-CM | POA: Diagnosis not present

## 2018-05-12 DIAGNOSIS — I2699 Other pulmonary embolism without acute cor pulmonale: Secondary | ICD-10-CM | POA: Diagnosis not present

## 2018-05-12 DIAGNOSIS — G309 Alzheimer's disease, unspecified: Secondary | ICD-10-CM | POA: Diagnosis not present

## 2018-05-12 DIAGNOSIS — E039 Hypothyroidism, unspecified: Secondary | ICD-10-CM | POA: Diagnosis not present

## 2018-05-12 DIAGNOSIS — I739 Peripheral vascular disease, unspecified: Secondary | ICD-10-CM

## 2018-05-12 DIAGNOSIS — I6529 Occlusion and stenosis of unspecified carotid artery: Secondary | ICD-10-CM

## 2018-07-19 DIAGNOSIS — I10 Essential (primary) hypertension: Secondary | ICD-10-CM | POA: Diagnosis not present

## 2018-07-19 DIAGNOSIS — I2782 Chronic pulmonary embolism: Secondary | ICD-10-CM | POA: Diagnosis not present

## 2018-07-19 DIAGNOSIS — I739 Peripheral vascular disease, unspecified: Secondary | ICD-10-CM | POA: Diagnosis not present

## 2018-07-19 DIAGNOSIS — F0150 Vascular dementia without behavioral disturbance: Secondary | ICD-10-CM | POA: Diagnosis not present

## 2018-08-17 DIAGNOSIS — B342 Coronavirus infection, unspecified: Secondary | ICD-10-CM | POA: Diagnosis not present

## 2018-08-20 ENCOUNTER — Other Ambulatory Visit: Payer: Self-pay

## 2018-08-27 DIAGNOSIS — F0150 Vascular dementia without behavioral disturbance: Secondary | ICD-10-CM | POA: Diagnosis not present

## 2018-08-27 DIAGNOSIS — F331 Major depressive disorder, recurrent, moderate: Secondary | ICD-10-CM | POA: Diagnosis not present

## 2018-08-27 DIAGNOSIS — B351 Tinea unguium: Secondary | ICD-10-CM | POA: Diagnosis not present

## 2018-08-27 DIAGNOSIS — K219 Gastro-esophageal reflux disease without esophagitis: Secondary | ICD-10-CM | POA: Diagnosis not present

## 2018-08-27 DIAGNOSIS — I739 Peripheral vascular disease, unspecified: Secondary | ICD-10-CM | POA: Diagnosis not present

## 2018-08-27 DIAGNOSIS — E039 Hypothyroidism, unspecified: Secondary | ICD-10-CM | POA: Diagnosis not present

## 2018-08-27 DIAGNOSIS — I10 Essential (primary) hypertension: Secondary | ICD-10-CM | POA: Diagnosis not present

## 2018-08-27 DIAGNOSIS — I6523 Occlusion and stenosis of bilateral carotid arteries: Secondary | ICD-10-CM | POA: Diagnosis not present

## 2018-08-27 DIAGNOSIS — N390 Urinary tract infection, site not specified: Secondary | ICD-10-CM | POA: Diagnosis not present

## 2018-09-23 DIAGNOSIS — Z03818 Encounter for observation for suspected exposure to other biological agents ruled out: Secondary | ICD-10-CM | POA: Diagnosis not present

## 2018-09-30 DIAGNOSIS — Z03818 Encounter for observation for suspected exposure to other biological agents ruled out: Secondary | ICD-10-CM | POA: Diagnosis not present

## 2018-10-07 DIAGNOSIS — M1990 Unspecified osteoarthritis, unspecified site: Secondary | ICD-10-CM | POA: Diagnosis not present

## 2018-10-07 DIAGNOSIS — Z8673 Personal history of transient ischemic attack (TIA), and cerebral infarction without residual deficits: Secondary | ICD-10-CM | POA: Diagnosis not present

## 2018-10-07 DIAGNOSIS — G580 Intercostal neuropathy: Secondary | ICD-10-CM | POA: Diagnosis not present

## 2018-10-07 DIAGNOSIS — I739 Peripheral vascular disease, unspecified: Secondary | ICD-10-CM | POA: Diagnosis not present

## 2018-10-08 DIAGNOSIS — G580 Intercostal neuropathy: Secondary | ICD-10-CM | POA: Diagnosis not present

## 2018-10-08 DIAGNOSIS — M1990 Unspecified osteoarthritis, unspecified site: Secondary | ICD-10-CM | POA: Diagnosis not present

## 2018-10-08 DIAGNOSIS — Z8673 Personal history of transient ischemic attack (TIA), and cerebral infarction without residual deficits: Secondary | ICD-10-CM | POA: Diagnosis not present

## 2018-10-08 DIAGNOSIS — I739 Peripheral vascular disease, unspecified: Secondary | ICD-10-CM | POA: Diagnosis not present

## 2018-10-11 DIAGNOSIS — I739 Peripheral vascular disease, unspecified: Secondary | ICD-10-CM | POA: Diagnosis not present

## 2018-10-11 DIAGNOSIS — G580 Intercostal neuropathy: Secondary | ICD-10-CM | POA: Diagnosis not present

## 2018-10-11 DIAGNOSIS — Z8673 Personal history of transient ischemic attack (TIA), and cerebral infarction without residual deficits: Secondary | ICD-10-CM | POA: Diagnosis not present

## 2018-10-11 DIAGNOSIS — M1990 Unspecified osteoarthritis, unspecified site: Secondary | ICD-10-CM | POA: Diagnosis not present

## 2018-10-12 DIAGNOSIS — G580 Intercostal neuropathy: Secondary | ICD-10-CM | POA: Diagnosis not present

## 2018-10-12 DIAGNOSIS — Z8673 Personal history of transient ischemic attack (TIA), and cerebral infarction without residual deficits: Secondary | ICD-10-CM | POA: Diagnosis not present

## 2018-10-12 DIAGNOSIS — I739 Peripheral vascular disease, unspecified: Secondary | ICD-10-CM | POA: Diagnosis not present

## 2018-10-12 DIAGNOSIS — M1990 Unspecified osteoarthritis, unspecified site: Secondary | ICD-10-CM | POA: Diagnosis not present

## 2018-10-13 DIAGNOSIS — Z8673 Personal history of transient ischemic attack (TIA), and cerebral infarction without residual deficits: Secondary | ICD-10-CM | POA: Diagnosis not present

## 2018-10-13 DIAGNOSIS — G580 Intercostal neuropathy: Secondary | ICD-10-CM | POA: Diagnosis not present

## 2018-10-13 DIAGNOSIS — M1990 Unspecified osteoarthritis, unspecified site: Secondary | ICD-10-CM | POA: Diagnosis not present

## 2018-10-13 DIAGNOSIS — I739 Peripheral vascular disease, unspecified: Secondary | ICD-10-CM | POA: Diagnosis not present

## 2018-10-14 DIAGNOSIS — Z8673 Personal history of transient ischemic attack (TIA), and cerebral infarction without residual deficits: Secondary | ICD-10-CM | POA: Diagnosis not present

## 2018-10-14 DIAGNOSIS — I739 Peripheral vascular disease, unspecified: Secondary | ICD-10-CM | POA: Diagnosis not present

## 2018-10-14 DIAGNOSIS — M1990 Unspecified osteoarthritis, unspecified site: Secondary | ICD-10-CM | POA: Diagnosis not present

## 2018-10-14 DIAGNOSIS — G580 Intercostal neuropathy: Secondary | ICD-10-CM | POA: Diagnosis not present

## 2018-10-15 DIAGNOSIS — G580 Intercostal neuropathy: Secondary | ICD-10-CM | POA: Diagnosis not present

## 2018-10-15 DIAGNOSIS — I739 Peripheral vascular disease, unspecified: Secondary | ICD-10-CM | POA: Diagnosis not present

## 2018-10-15 DIAGNOSIS — Z8673 Personal history of transient ischemic attack (TIA), and cerebral infarction without residual deficits: Secondary | ICD-10-CM | POA: Diagnosis not present

## 2018-10-15 DIAGNOSIS — M1990 Unspecified osteoarthritis, unspecified site: Secondary | ICD-10-CM | POA: Diagnosis not present

## 2018-10-18 DIAGNOSIS — G580 Intercostal neuropathy: Secondary | ICD-10-CM | POA: Diagnosis not present

## 2018-10-18 DIAGNOSIS — I739 Peripheral vascular disease, unspecified: Secondary | ICD-10-CM | POA: Diagnosis not present

## 2018-10-18 DIAGNOSIS — Z8673 Personal history of transient ischemic attack (TIA), and cerebral infarction without residual deficits: Secondary | ICD-10-CM | POA: Diagnosis not present

## 2018-10-18 DIAGNOSIS — M1990 Unspecified osteoarthritis, unspecified site: Secondary | ICD-10-CM | POA: Diagnosis not present

## 2018-10-19 DIAGNOSIS — M1990 Unspecified osteoarthritis, unspecified site: Secondary | ICD-10-CM | POA: Diagnosis not present

## 2018-10-19 DIAGNOSIS — Z8673 Personal history of transient ischemic attack (TIA), and cerebral infarction without residual deficits: Secondary | ICD-10-CM | POA: Diagnosis not present

## 2018-10-19 DIAGNOSIS — I739 Peripheral vascular disease, unspecified: Secondary | ICD-10-CM | POA: Diagnosis not present

## 2018-10-19 DIAGNOSIS — R220 Localized swelling, mass and lump, head: Secondary | ICD-10-CM | POA: Diagnosis not present

## 2018-10-19 DIAGNOSIS — G580 Intercostal neuropathy: Secondary | ICD-10-CM | POA: Diagnosis not present

## 2018-10-20 DIAGNOSIS — G580 Intercostal neuropathy: Secondary | ICD-10-CM | POA: Diagnosis not present

## 2018-10-20 DIAGNOSIS — M1990 Unspecified osteoarthritis, unspecified site: Secondary | ICD-10-CM | POA: Diagnosis not present

## 2018-10-20 DIAGNOSIS — Z8673 Personal history of transient ischemic attack (TIA), and cerebral infarction without residual deficits: Secondary | ICD-10-CM | POA: Diagnosis not present

## 2018-10-20 DIAGNOSIS — I739 Peripheral vascular disease, unspecified: Secondary | ICD-10-CM | POA: Diagnosis not present

## 2018-10-21 DIAGNOSIS — Z8673 Personal history of transient ischemic attack (TIA), and cerebral infarction without residual deficits: Secondary | ICD-10-CM | POA: Diagnosis not present

## 2018-10-21 DIAGNOSIS — G580 Intercostal neuropathy: Secondary | ICD-10-CM | POA: Diagnosis not present

## 2018-10-21 DIAGNOSIS — M1990 Unspecified osteoarthritis, unspecified site: Secondary | ICD-10-CM | POA: Diagnosis not present

## 2018-10-21 DIAGNOSIS — I739 Peripheral vascular disease, unspecified: Secondary | ICD-10-CM | POA: Diagnosis not present

## 2018-10-22 DIAGNOSIS — M1990 Unspecified osteoarthritis, unspecified site: Secondary | ICD-10-CM | POA: Diagnosis not present

## 2018-10-22 DIAGNOSIS — I739 Peripheral vascular disease, unspecified: Secondary | ICD-10-CM | POA: Diagnosis not present

## 2018-10-22 DIAGNOSIS — G580 Intercostal neuropathy: Secondary | ICD-10-CM | POA: Diagnosis not present

## 2018-10-22 DIAGNOSIS — Z03818 Encounter for observation for suspected exposure to other biological agents ruled out: Secondary | ICD-10-CM | POA: Diagnosis not present

## 2018-10-22 DIAGNOSIS — Z8673 Personal history of transient ischemic attack (TIA), and cerebral infarction without residual deficits: Secondary | ICD-10-CM | POA: Diagnosis not present

## 2018-10-25 DIAGNOSIS — I739 Peripheral vascular disease, unspecified: Secondary | ICD-10-CM | POA: Diagnosis not present

## 2018-10-25 DIAGNOSIS — M1990 Unspecified osteoarthritis, unspecified site: Secondary | ICD-10-CM | POA: Diagnosis not present

## 2018-10-25 DIAGNOSIS — G580 Intercostal neuropathy: Secondary | ICD-10-CM | POA: Diagnosis not present

## 2018-10-25 DIAGNOSIS — Z8673 Personal history of transient ischemic attack (TIA), and cerebral infarction without residual deficits: Secondary | ICD-10-CM | POA: Diagnosis not present

## 2018-10-26 DIAGNOSIS — Z8673 Personal history of transient ischemic attack (TIA), and cerebral infarction without residual deficits: Secondary | ICD-10-CM | POA: Diagnosis not present

## 2018-10-26 DIAGNOSIS — G580 Intercostal neuropathy: Secondary | ICD-10-CM | POA: Diagnosis not present

## 2018-10-26 DIAGNOSIS — I739 Peripheral vascular disease, unspecified: Secondary | ICD-10-CM | POA: Diagnosis not present

## 2018-10-26 DIAGNOSIS — M1990 Unspecified osteoarthritis, unspecified site: Secondary | ICD-10-CM | POA: Diagnosis not present

## 2018-10-27 DIAGNOSIS — Z8673 Personal history of transient ischemic attack (TIA), and cerebral infarction without residual deficits: Secondary | ICD-10-CM | POA: Diagnosis not present

## 2018-10-27 DIAGNOSIS — I739 Peripheral vascular disease, unspecified: Secondary | ICD-10-CM | POA: Diagnosis not present

## 2018-10-27 DIAGNOSIS — M1990 Unspecified osteoarthritis, unspecified site: Secondary | ICD-10-CM | POA: Diagnosis not present

## 2018-10-27 DIAGNOSIS — G580 Intercostal neuropathy: Secondary | ICD-10-CM | POA: Diagnosis not present

## 2018-10-28 DIAGNOSIS — M1990 Unspecified osteoarthritis, unspecified site: Secondary | ICD-10-CM | POA: Diagnosis not present

## 2018-10-28 DIAGNOSIS — I739 Peripheral vascular disease, unspecified: Secondary | ICD-10-CM | POA: Diagnosis not present

## 2018-10-28 DIAGNOSIS — Z8673 Personal history of transient ischemic attack (TIA), and cerebral infarction without residual deficits: Secondary | ICD-10-CM | POA: Diagnosis not present

## 2018-10-28 DIAGNOSIS — G580 Intercostal neuropathy: Secondary | ICD-10-CM | POA: Diagnosis not present

## 2018-10-29 DIAGNOSIS — G580 Intercostal neuropathy: Secondary | ICD-10-CM | POA: Diagnosis not present

## 2018-10-29 DIAGNOSIS — I739 Peripheral vascular disease, unspecified: Secondary | ICD-10-CM | POA: Diagnosis not present

## 2018-10-29 DIAGNOSIS — Z8673 Personal history of transient ischemic attack (TIA), and cerebral infarction without residual deficits: Secondary | ICD-10-CM | POA: Diagnosis not present

## 2018-10-29 DIAGNOSIS — M1990 Unspecified osteoarthritis, unspecified site: Secondary | ICD-10-CM | POA: Diagnosis not present

## 2018-11-01 DIAGNOSIS — G580 Intercostal neuropathy: Secondary | ICD-10-CM | POA: Diagnosis not present

## 2018-11-01 DIAGNOSIS — Z8673 Personal history of transient ischemic attack (TIA), and cerebral infarction without residual deficits: Secondary | ICD-10-CM | POA: Diagnosis not present

## 2018-11-01 DIAGNOSIS — I739 Peripheral vascular disease, unspecified: Secondary | ICD-10-CM | POA: Diagnosis not present

## 2018-11-01 DIAGNOSIS — M1990 Unspecified osteoarthritis, unspecified site: Secondary | ICD-10-CM | POA: Diagnosis not present

## 2018-11-02 DIAGNOSIS — Z8673 Personal history of transient ischemic attack (TIA), and cerebral infarction without residual deficits: Secondary | ICD-10-CM | POA: Diagnosis not present

## 2018-11-02 DIAGNOSIS — I739 Peripheral vascular disease, unspecified: Secondary | ICD-10-CM | POA: Diagnosis not present

## 2018-11-02 DIAGNOSIS — G580 Intercostal neuropathy: Secondary | ICD-10-CM | POA: Diagnosis not present

## 2018-11-02 DIAGNOSIS — Z03818 Encounter for observation for suspected exposure to other biological agents ruled out: Secondary | ICD-10-CM | POA: Diagnosis not present

## 2018-11-02 DIAGNOSIS — M1990 Unspecified osteoarthritis, unspecified site: Secondary | ICD-10-CM | POA: Diagnosis not present

## 2018-11-03 DIAGNOSIS — Z8673 Personal history of transient ischemic attack (TIA), and cerebral infarction without residual deficits: Secondary | ICD-10-CM | POA: Diagnosis not present

## 2018-11-03 DIAGNOSIS — I739 Peripheral vascular disease, unspecified: Secondary | ICD-10-CM | POA: Diagnosis not present

## 2018-11-03 DIAGNOSIS — M1990 Unspecified osteoarthritis, unspecified site: Secondary | ICD-10-CM | POA: Diagnosis not present

## 2018-11-03 DIAGNOSIS — G580 Intercostal neuropathy: Secondary | ICD-10-CM | POA: Diagnosis not present

## 2018-11-04 DIAGNOSIS — I739 Peripheral vascular disease, unspecified: Secondary | ICD-10-CM | POA: Diagnosis not present

## 2018-11-04 DIAGNOSIS — M1990 Unspecified osteoarthritis, unspecified site: Secondary | ICD-10-CM | POA: Diagnosis not present

## 2018-11-04 DIAGNOSIS — G580 Intercostal neuropathy: Secondary | ICD-10-CM | POA: Diagnosis not present

## 2018-11-04 DIAGNOSIS — Z8673 Personal history of transient ischemic attack (TIA), and cerebral infarction without residual deficits: Secondary | ICD-10-CM | POA: Diagnosis not present

## 2018-11-05 DIAGNOSIS — M1990 Unspecified osteoarthritis, unspecified site: Secondary | ICD-10-CM | POA: Diagnosis not present

## 2018-11-05 DIAGNOSIS — I739 Peripheral vascular disease, unspecified: Secondary | ICD-10-CM | POA: Diagnosis not present

## 2018-11-05 DIAGNOSIS — G580 Intercostal neuropathy: Secondary | ICD-10-CM | POA: Diagnosis not present

## 2018-11-05 DIAGNOSIS — Z03818 Encounter for observation for suspected exposure to other biological agents ruled out: Secondary | ICD-10-CM | POA: Diagnosis not present

## 2018-11-05 DIAGNOSIS — Z8673 Personal history of transient ischemic attack (TIA), and cerebral infarction without residual deficits: Secondary | ICD-10-CM | POA: Diagnosis not present

## 2018-11-08 DIAGNOSIS — Z8673 Personal history of transient ischemic attack (TIA), and cerebral infarction without residual deficits: Secondary | ICD-10-CM | POA: Diagnosis not present

## 2018-11-08 DIAGNOSIS — M1990 Unspecified osteoarthritis, unspecified site: Secondary | ICD-10-CM | POA: Diagnosis not present

## 2018-11-08 DIAGNOSIS — G580 Intercostal neuropathy: Secondary | ICD-10-CM | POA: Diagnosis not present

## 2018-11-08 DIAGNOSIS — I739 Peripheral vascular disease, unspecified: Secondary | ICD-10-CM | POA: Diagnosis not present

## 2018-11-09 DIAGNOSIS — Z8673 Personal history of transient ischemic attack (TIA), and cerebral infarction without residual deficits: Secondary | ICD-10-CM | POA: Diagnosis not present

## 2018-11-09 DIAGNOSIS — I2782 Chronic pulmonary embolism: Secondary | ICD-10-CM | POA: Diagnosis not present

## 2018-11-09 DIAGNOSIS — N390 Urinary tract infection, site not specified: Secondary | ICD-10-CM | POA: Diagnosis not present

## 2018-11-09 DIAGNOSIS — G580 Intercostal neuropathy: Secondary | ICD-10-CM | POA: Diagnosis not present

## 2018-11-09 DIAGNOSIS — I739 Peripheral vascular disease, unspecified: Secondary | ICD-10-CM | POA: Diagnosis not present

## 2018-11-09 DIAGNOSIS — F0150 Vascular dementia without behavioral disturbance: Secondary | ICD-10-CM | POA: Diagnosis not present

## 2018-11-09 DIAGNOSIS — I10 Essential (primary) hypertension: Secondary | ICD-10-CM | POA: Diagnosis not present

## 2018-11-09 DIAGNOSIS — M1990 Unspecified osteoarthritis, unspecified site: Secondary | ICD-10-CM | POA: Diagnosis not present

## 2018-11-10 DIAGNOSIS — M1990 Unspecified osteoarthritis, unspecified site: Secondary | ICD-10-CM | POA: Diagnosis not present

## 2018-11-10 DIAGNOSIS — Z8673 Personal history of transient ischemic attack (TIA), and cerebral infarction without residual deficits: Secondary | ICD-10-CM | POA: Diagnosis not present

## 2018-11-10 DIAGNOSIS — I739 Peripheral vascular disease, unspecified: Secondary | ICD-10-CM | POA: Diagnosis not present

## 2018-11-10 DIAGNOSIS — G580 Intercostal neuropathy: Secondary | ICD-10-CM | POA: Diagnosis not present

## 2018-11-11 DIAGNOSIS — I739 Peripheral vascular disease, unspecified: Secondary | ICD-10-CM | POA: Diagnosis not present

## 2018-11-11 DIAGNOSIS — G580 Intercostal neuropathy: Secondary | ICD-10-CM | POA: Diagnosis not present

## 2018-11-11 DIAGNOSIS — M1990 Unspecified osteoarthritis, unspecified site: Secondary | ICD-10-CM | POA: Diagnosis not present

## 2018-11-11 DIAGNOSIS — Z8673 Personal history of transient ischemic attack (TIA), and cerebral infarction without residual deficits: Secondary | ICD-10-CM | POA: Diagnosis not present

## 2018-11-12 DIAGNOSIS — I739 Peripheral vascular disease, unspecified: Secondary | ICD-10-CM | POA: Diagnosis not present

## 2018-11-12 DIAGNOSIS — Z8673 Personal history of transient ischemic attack (TIA), and cerebral infarction without residual deficits: Secondary | ICD-10-CM | POA: Diagnosis not present

## 2018-11-12 DIAGNOSIS — G580 Intercostal neuropathy: Secondary | ICD-10-CM | POA: Diagnosis not present

## 2018-11-12 DIAGNOSIS — M1990 Unspecified osteoarthritis, unspecified site: Secondary | ICD-10-CM | POA: Diagnosis not present

## 2018-11-15 DIAGNOSIS — I739 Peripheral vascular disease, unspecified: Secondary | ICD-10-CM | POA: Diagnosis not present

## 2018-11-15 DIAGNOSIS — Z8673 Personal history of transient ischemic attack (TIA), and cerebral infarction without residual deficits: Secondary | ICD-10-CM | POA: Diagnosis not present

## 2018-11-15 DIAGNOSIS — M1990 Unspecified osteoarthritis, unspecified site: Secondary | ICD-10-CM | POA: Diagnosis not present

## 2018-11-15 DIAGNOSIS — G580 Intercostal neuropathy: Secondary | ICD-10-CM | POA: Diagnosis not present

## 2018-11-16 DIAGNOSIS — I739 Peripheral vascular disease, unspecified: Secondary | ICD-10-CM | POA: Diagnosis not present

## 2018-11-16 DIAGNOSIS — G580 Intercostal neuropathy: Secondary | ICD-10-CM | POA: Diagnosis not present

## 2018-11-16 DIAGNOSIS — M1990 Unspecified osteoarthritis, unspecified site: Secondary | ICD-10-CM | POA: Diagnosis not present

## 2018-11-16 DIAGNOSIS — Z8673 Personal history of transient ischemic attack (TIA), and cerebral infarction without residual deficits: Secondary | ICD-10-CM | POA: Diagnosis not present

## 2018-11-17 DIAGNOSIS — Z8673 Personal history of transient ischemic attack (TIA), and cerebral infarction without residual deficits: Secondary | ICD-10-CM | POA: Diagnosis not present

## 2018-11-17 DIAGNOSIS — G580 Intercostal neuropathy: Secondary | ICD-10-CM | POA: Diagnosis not present

## 2018-11-17 DIAGNOSIS — M1990 Unspecified osteoarthritis, unspecified site: Secondary | ICD-10-CM | POA: Diagnosis not present

## 2018-11-17 DIAGNOSIS — I739 Peripheral vascular disease, unspecified: Secondary | ICD-10-CM | POA: Diagnosis not present

## 2018-12-06 DIAGNOSIS — B351 Tinea unguium: Secondary | ICD-10-CM | POA: Diagnosis not present

## 2018-12-08 DIAGNOSIS — B351 Tinea unguium: Secondary | ICD-10-CM | POA: Diagnosis not present

## 2019-01-05 DIAGNOSIS — F0150 Vascular dementia without behavioral disturbance: Secondary | ICD-10-CM | POA: Diagnosis not present

## 2019-01-05 DIAGNOSIS — I739 Peripheral vascular disease, unspecified: Secondary | ICD-10-CM

## 2019-01-05 DIAGNOSIS — N390 Urinary tract infection, site not specified: Secondary | ICD-10-CM | POA: Diagnosis not present

## 2019-01-05 DIAGNOSIS — E039 Hypothyroidism, unspecified: Secondary | ICD-10-CM

## 2019-01-05 DIAGNOSIS — I10 Essential (primary) hypertension: Secondary | ICD-10-CM | POA: Diagnosis not present

## 2019-01-05 DIAGNOSIS — I482 Chronic atrial fibrillation, unspecified: Secondary | ICD-10-CM | POA: Diagnosis not present

## 2019-01-05 DIAGNOSIS — I6529 Occlusion and stenosis of unspecified carotid artery: Secondary | ICD-10-CM

## 2019-01-24 ENCOUNTER — Telehealth: Payer: Self-pay

## 2019-01-24 NOTE — Telephone Encounter (Signed)
Left message to call office

## 2019-01-24 NOTE — Telephone Encounter (Signed)
Pt is presently in [place] at [facility] but since pt has tested positive pt is at isolation unit at [facility]. pts granddaughter spoke with [facility] and they have pt on prednisone unless pt exhibits more symptoms.Ronald Pippins has not heard since yesterday but then pt was eating and drinking with no fever. Pt wanted to get Dr Alla German opinion and to see if Dr Silvio Pate has seen pt and request cb.

## 2019-01-24 NOTE — Telephone Encounter (Signed)
[place] Night - Client TELEPHONE ADVICE RECORD [facility] Patient Name: [name] Gender: Female DOB: 13-May-1930 Age: 86 Y 48 M 18 D Return Phone Number: [phone] (Primary), [phone] (Secondary) Address: City/State/Zip: [place] [address] Client [facility] Night - Client Client Site [place] Physician Viviana Simpler - MD Contact Type Call Who Is Calling Patient / Member / Family / Caregiver Call Type Triage / Clinical Caller Name Mindi Ramsey Relationship To Patient Grandchild Return Phone Number [phone] (Secondary) Chief Complaint Health information question (non symptomatic) Reason for Call Request to Speak to a Physician Initial Comment Caller states that her Grandmother just recently was diagnosed with COVID. She would like to speak with the oncall about special care. Translation No Nurse Assessment Guidelines Guideline Title Affirmed Question Affirmed Notes Nurse Date/Time (Eastern Time) Disp. Time Eilene Ghazi Time) Disposition Final User 01/22/2019 7:03:35 AM Attempt made - message left Chancy Hurter 01/22/2019 7:04:23 AM Attempt made - message left Chancy Hurter 01/22/2019 7:09:36 AM FINAL ATTEMPT MADE - message left Yes Derrel Nip, RN, Santiago Glad Comments User: Eloisa Northern, RN Date/Time Eilene Ghazi Time): 01/22/2019 7:04:08 AM Primary number is a wrong number

## 2019-01-24 NOTE — Telephone Encounter (Signed)
Please let her know that I have not seen her due to the isolation but spoke to the nurse this morning. She is not having low oxygen or respiratory difficulty, so I wouldn't start the prednisone at this point

## 2019-01-29 NOTE — Telephone Encounter (Signed)
She never returned my call, but I am sure this was handled at [facility].

## 2019-02-10 DIAGNOSIS — R2689 Other abnormalities of gait and mobility: Secondary | ICD-10-CM | POA: Diagnosis not present

## 2019-02-10 DIAGNOSIS — I739 Peripheral vascular disease, unspecified: Secondary | ICD-10-CM | POA: Diagnosis not present

## 2019-02-10 DIAGNOSIS — Z741 Need for assistance with personal care: Secondary | ICD-10-CM | POA: Diagnosis not present

## 2019-02-10 DIAGNOSIS — R262 Difficulty in walking, not elsewhere classified: Secondary | ICD-10-CM | POA: Diagnosis not present

## 2019-02-10 DIAGNOSIS — U071 COVID-19: Secondary | ICD-10-CM | POA: Diagnosis not present

## 2019-02-10 DIAGNOSIS — Z8673 Personal history of transient ischemic attack (TIA), and cerebral infarction without residual deficits: Secondary | ICD-10-CM | POA: Diagnosis not present

## 2019-02-10 DIAGNOSIS — R2681 Unsteadiness on feet: Secondary | ICD-10-CM | POA: Diagnosis not present

## 2019-02-10 DIAGNOSIS — M1990 Unspecified osteoarthritis, unspecified site: Secondary | ICD-10-CM | POA: Diagnosis not present

## 2019-02-10 DIAGNOSIS — M6281 Muscle weakness (generalized): Secondary | ICD-10-CM | POA: Diagnosis not present

## 2019-02-10 DIAGNOSIS — R278 Other lack of coordination: Secondary | ICD-10-CM | POA: Diagnosis not present

## 2019-02-14 DIAGNOSIS — I739 Peripheral vascular disease, unspecified: Secondary | ICD-10-CM | POA: Diagnosis not present

## 2019-02-14 DIAGNOSIS — R262 Difficulty in walking, not elsewhere classified: Secondary | ICD-10-CM | POA: Diagnosis not present

## 2019-02-14 DIAGNOSIS — U071 COVID-19: Secondary | ICD-10-CM | POA: Diagnosis not present

## 2019-02-14 DIAGNOSIS — M6281 Muscle weakness (generalized): Secondary | ICD-10-CM | POA: Diagnosis not present

## 2019-02-14 DIAGNOSIS — M1990 Unspecified osteoarthritis, unspecified site: Secondary | ICD-10-CM | POA: Diagnosis not present

## 2019-02-14 DIAGNOSIS — Z8673 Personal history of transient ischemic attack (TIA), and cerebral infarction without residual deficits: Secondary | ICD-10-CM | POA: Diagnosis not present

## 2019-02-15 DIAGNOSIS — U071 COVID-19: Secondary | ICD-10-CM | POA: Diagnosis not present

## 2019-02-15 DIAGNOSIS — R262 Difficulty in walking, not elsewhere classified: Secondary | ICD-10-CM | POA: Diagnosis not present

## 2019-02-15 DIAGNOSIS — M1990 Unspecified osteoarthritis, unspecified site: Secondary | ICD-10-CM | POA: Diagnosis not present

## 2019-02-15 DIAGNOSIS — Z8673 Personal history of transient ischemic attack (TIA), and cerebral infarction without residual deficits: Secondary | ICD-10-CM | POA: Diagnosis not present

## 2019-02-15 DIAGNOSIS — M6281 Muscle weakness (generalized): Secondary | ICD-10-CM | POA: Diagnosis not present

## 2019-02-15 DIAGNOSIS — I739 Peripheral vascular disease, unspecified: Secondary | ICD-10-CM | POA: Diagnosis not present

## 2019-02-16 DIAGNOSIS — Z8673 Personal history of transient ischemic attack (TIA), and cerebral infarction without residual deficits: Secondary | ICD-10-CM | POA: Diagnosis not present

## 2019-02-16 DIAGNOSIS — I739 Peripheral vascular disease, unspecified: Secondary | ICD-10-CM | POA: Diagnosis not present

## 2019-02-16 DIAGNOSIS — R262 Difficulty in walking, not elsewhere classified: Secondary | ICD-10-CM | POA: Diagnosis not present

## 2019-02-16 DIAGNOSIS — U071 COVID-19: Secondary | ICD-10-CM | POA: Diagnosis not present

## 2019-02-16 DIAGNOSIS — M1990 Unspecified osteoarthritis, unspecified site: Secondary | ICD-10-CM | POA: Diagnosis not present

## 2019-02-16 DIAGNOSIS — M6281 Muscle weakness (generalized): Secondary | ICD-10-CM | POA: Diagnosis not present

## 2019-02-17 DIAGNOSIS — Z8673 Personal history of transient ischemic attack (TIA), and cerebral infarction without residual deficits: Secondary | ICD-10-CM | POA: Diagnosis not present

## 2019-02-17 DIAGNOSIS — M1990 Unspecified osteoarthritis, unspecified site: Secondary | ICD-10-CM | POA: Diagnosis not present

## 2019-02-17 DIAGNOSIS — I739 Peripheral vascular disease, unspecified: Secondary | ICD-10-CM | POA: Diagnosis not present

## 2019-02-17 DIAGNOSIS — M6281 Muscle weakness (generalized): Secondary | ICD-10-CM | POA: Diagnosis not present

## 2019-02-17 DIAGNOSIS — R262 Difficulty in walking, not elsewhere classified: Secondary | ICD-10-CM | POA: Diagnosis not present

## 2019-02-17 DIAGNOSIS — U071 COVID-19: Secondary | ICD-10-CM | POA: Diagnosis not present

## 2019-02-18 DIAGNOSIS — M6281 Muscle weakness (generalized): Secondary | ICD-10-CM | POA: Diagnosis not present

## 2019-02-18 DIAGNOSIS — M1990 Unspecified osteoarthritis, unspecified site: Secondary | ICD-10-CM | POA: Diagnosis not present

## 2019-02-18 DIAGNOSIS — Z8673 Personal history of transient ischemic attack (TIA), and cerebral infarction without residual deficits: Secondary | ICD-10-CM | POA: Diagnosis not present

## 2019-02-18 DIAGNOSIS — U071 COVID-19: Secondary | ICD-10-CM | POA: Diagnosis not present

## 2019-02-18 DIAGNOSIS — I739 Peripheral vascular disease, unspecified: Secondary | ICD-10-CM | POA: Diagnosis not present

## 2019-02-18 DIAGNOSIS — R262 Difficulty in walking, not elsewhere classified: Secondary | ICD-10-CM | POA: Diagnosis not present

## 2019-02-21 DIAGNOSIS — I739 Peripheral vascular disease, unspecified: Secondary | ICD-10-CM | POA: Diagnosis not present

## 2019-02-21 DIAGNOSIS — U071 COVID-19: Secondary | ICD-10-CM | POA: Diagnosis not present

## 2019-02-21 DIAGNOSIS — R278 Other lack of coordination: Secondary | ICD-10-CM | POA: Diagnosis not present

## 2019-02-21 DIAGNOSIS — Z741 Need for assistance with personal care: Secondary | ICD-10-CM | POA: Diagnosis not present

## 2019-02-21 DIAGNOSIS — Z8673 Personal history of transient ischemic attack (TIA), and cerebral infarction without residual deficits: Secondary | ICD-10-CM | POA: Diagnosis not present

## 2019-02-21 DIAGNOSIS — M1990 Unspecified osteoarthritis, unspecified site: Secondary | ICD-10-CM | POA: Diagnosis not present

## 2019-02-21 DIAGNOSIS — M6281 Muscle weakness (generalized): Secondary | ICD-10-CM | POA: Diagnosis not present

## 2019-02-21 DIAGNOSIS — R2689 Other abnormalities of gait and mobility: Secondary | ICD-10-CM | POA: Diagnosis not present

## 2019-02-21 DIAGNOSIS — R2681 Unsteadiness on feet: Secondary | ICD-10-CM | POA: Diagnosis not present

## 2019-02-21 DIAGNOSIS — R262 Difficulty in walking, not elsewhere classified: Secondary | ICD-10-CM | POA: Diagnosis not present

## 2019-02-22 DIAGNOSIS — R262 Difficulty in walking, not elsewhere classified: Secondary | ICD-10-CM | POA: Diagnosis not present

## 2019-02-22 DIAGNOSIS — M6281 Muscle weakness (generalized): Secondary | ICD-10-CM | POA: Diagnosis not present

## 2019-02-22 DIAGNOSIS — I739 Peripheral vascular disease, unspecified: Secondary | ICD-10-CM | POA: Diagnosis not present

## 2019-02-22 DIAGNOSIS — U071 COVID-19: Secondary | ICD-10-CM | POA: Diagnosis not present

## 2019-02-22 DIAGNOSIS — M1990 Unspecified osteoarthritis, unspecified site: Secondary | ICD-10-CM | POA: Diagnosis not present

## 2019-02-22 DIAGNOSIS — Z8673 Personal history of transient ischemic attack (TIA), and cerebral infarction without residual deficits: Secondary | ICD-10-CM | POA: Diagnosis not present

## 2019-02-23 DIAGNOSIS — U071 COVID-19: Secondary | ICD-10-CM | POA: Diagnosis not present

## 2019-02-23 DIAGNOSIS — I739 Peripheral vascular disease, unspecified: Secondary | ICD-10-CM | POA: Diagnosis not present

## 2019-02-23 DIAGNOSIS — M1990 Unspecified osteoarthritis, unspecified site: Secondary | ICD-10-CM | POA: Diagnosis not present

## 2019-02-23 DIAGNOSIS — M6281 Muscle weakness (generalized): Secondary | ICD-10-CM | POA: Diagnosis not present

## 2019-02-23 DIAGNOSIS — Z8673 Personal history of transient ischemic attack (TIA), and cerebral infarction without residual deficits: Secondary | ICD-10-CM | POA: Diagnosis not present

## 2019-02-23 DIAGNOSIS — R262 Difficulty in walking, not elsewhere classified: Secondary | ICD-10-CM | POA: Diagnosis not present

## 2019-02-24 DIAGNOSIS — U071 COVID-19: Secondary | ICD-10-CM | POA: Diagnosis not present

## 2019-02-24 DIAGNOSIS — Z8673 Personal history of transient ischemic attack (TIA), and cerebral infarction without residual deficits: Secondary | ICD-10-CM | POA: Diagnosis not present

## 2019-02-24 DIAGNOSIS — R262 Difficulty in walking, not elsewhere classified: Secondary | ICD-10-CM | POA: Diagnosis not present

## 2019-02-24 DIAGNOSIS — M1990 Unspecified osteoarthritis, unspecified site: Secondary | ICD-10-CM | POA: Diagnosis not present

## 2019-02-24 DIAGNOSIS — M6281 Muscle weakness (generalized): Secondary | ICD-10-CM | POA: Diagnosis not present

## 2019-02-24 DIAGNOSIS — I739 Peripheral vascular disease, unspecified: Secondary | ICD-10-CM | POA: Diagnosis not present

## 2019-02-24 DIAGNOSIS — I10 Essential (primary) hypertension: Secondary | ICD-10-CM | POA: Diagnosis not present

## 2019-02-24 DIAGNOSIS — I2782 Chronic pulmonary embolism: Secondary | ICD-10-CM | POA: Diagnosis not present

## 2019-02-24 DIAGNOSIS — F0150 Vascular dementia without behavioral disturbance: Secondary | ICD-10-CM | POA: Diagnosis not present

## 2019-02-24 DIAGNOSIS — I6529 Occlusion and stenosis of unspecified carotid artery: Secondary | ICD-10-CM

## 2019-02-28 DIAGNOSIS — Z8673 Personal history of transient ischemic attack (TIA), and cerebral infarction without residual deficits: Secondary | ICD-10-CM | POA: Diagnosis not present

## 2019-02-28 DIAGNOSIS — R262 Difficulty in walking, not elsewhere classified: Secondary | ICD-10-CM | POA: Diagnosis not present

## 2019-02-28 DIAGNOSIS — I739 Peripheral vascular disease, unspecified: Secondary | ICD-10-CM | POA: Diagnosis not present

## 2019-02-28 DIAGNOSIS — M1990 Unspecified osteoarthritis, unspecified site: Secondary | ICD-10-CM | POA: Diagnosis not present

## 2019-02-28 DIAGNOSIS — U071 COVID-19: Secondary | ICD-10-CM | POA: Diagnosis not present

## 2019-02-28 DIAGNOSIS — M6281 Muscle weakness (generalized): Secondary | ICD-10-CM | POA: Diagnosis not present

## 2019-03-01 DIAGNOSIS — Z8673 Personal history of transient ischemic attack (TIA), and cerebral infarction without residual deficits: Secondary | ICD-10-CM | POA: Diagnosis not present

## 2019-03-01 DIAGNOSIS — R262 Difficulty in walking, not elsewhere classified: Secondary | ICD-10-CM | POA: Diagnosis not present

## 2019-03-01 DIAGNOSIS — I739 Peripheral vascular disease, unspecified: Secondary | ICD-10-CM | POA: Diagnosis not present

## 2019-03-01 DIAGNOSIS — M6281 Muscle weakness (generalized): Secondary | ICD-10-CM | POA: Diagnosis not present

## 2019-03-01 DIAGNOSIS — M1990 Unspecified osteoarthritis, unspecified site: Secondary | ICD-10-CM | POA: Diagnosis not present

## 2019-03-01 DIAGNOSIS — U071 COVID-19: Secondary | ICD-10-CM | POA: Diagnosis not present

## 2019-03-02 DIAGNOSIS — Z8673 Personal history of transient ischemic attack (TIA), and cerebral infarction without residual deficits: Secondary | ICD-10-CM | POA: Diagnosis not present

## 2019-03-02 DIAGNOSIS — M6281 Muscle weakness (generalized): Secondary | ICD-10-CM | POA: Diagnosis not present

## 2019-03-02 DIAGNOSIS — R262 Difficulty in walking, not elsewhere classified: Secondary | ICD-10-CM | POA: Diagnosis not present

## 2019-03-02 DIAGNOSIS — M1990 Unspecified osteoarthritis, unspecified site: Secondary | ICD-10-CM | POA: Diagnosis not present

## 2019-03-02 DIAGNOSIS — U071 COVID-19: Secondary | ICD-10-CM | POA: Diagnosis not present

## 2019-03-02 DIAGNOSIS — I739 Peripheral vascular disease, unspecified: Secondary | ICD-10-CM | POA: Diagnosis not present

## 2019-03-03 DIAGNOSIS — M1990 Unspecified osteoarthritis, unspecified site: Secondary | ICD-10-CM | POA: Diagnosis not present

## 2019-03-03 DIAGNOSIS — M6281 Muscle weakness (generalized): Secondary | ICD-10-CM | POA: Diagnosis not present

## 2019-03-03 DIAGNOSIS — Z8673 Personal history of transient ischemic attack (TIA), and cerebral infarction without residual deficits: Secondary | ICD-10-CM | POA: Diagnosis not present

## 2019-03-03 DIAGNOSIS — R262 Difficulty in walking, not elsewhere classified: Secondary | ICD-10-CM | POA: Diagnosis not present

## 2019-03-03 DIAGNOSIS — I739 Peripheral vascular disease, unspecified: Secondary | ICD-10-CM | POA: Diagnosis not present

## 2019-03-03 DIAGNOSIS — U071 COVID-19: Secondary | ICD-10-CM | POA: Diagnosis not present

## 2019-03-07 DIAGNOSIS — I739 Peripheral vascular disease, unspecified: Secondary | ICD-10-CM | POA: Diagnosis not present

## 2019-03-07 DIAGNOSIS — R262 Difficulty in walking, not elsewhere classified: Secondary | ICD-10-CM | POA: Diagnosis not present

## 2019-03-07 DIAGNOSIS — U071 COVID-19: Secondary | ICD-10-CM | POA: Diagnosis not present

## 2019-03-07 DIAGNOSIS — M6281 Muscle weakness (generalized): Secondary | ICD-10-CM | POA: Diagnosis not present

## 2019-03-07 DIAGNOSIS — M1990 Unspecified osteoarthritis, unspecified site: Secondary | ICD-10-CM | POA: Diagnosis not present

## 2019-03-07 DIAGNOSIS — I10 Essential (primary) hypertension: Secondary | ICD-10-CM | POA: Diagnosis not present

## 2019-03-07 DIAGNOSIS — Z8673 Personal history of transient ischemic attack (TIA), and cerebral infarction without residual deficits: Secondary | ICD-10-CM | POA: Diagnosis not present

## 2019-03-08 DIAGNOSIS — M6281 Muscle weakness (generalized): Secondary | ICD-10-CM | POA: Diagnosis not present

## 2019-03-08 DIAGNOSIS — U071 COVID-19: Secondary | ICD-10-CM | POA: Diagnosis not present

## 2019-03-08 DIAGNOSIS — I739 Peripheral vascular disease, unspecified: Secondary | ICD-10-CM | POA: Diagnosis not present

## 2019-03-08 DIAGNOSIS — R262 Difficulty in walking, not elsewhere classified: Secondary | ICD-10-CM | POA: Diagnosis not present

## 2019-03-08 DIAGNOSIS — M1990 Unspecified osteoarthritis, unspecified site: Secondary | ICD-10-CM | POA: Diagnosis not present

## 2019-03-08 DIAGNOSIS — Z8673 Personal history of transient ischemic attack (TIA), and cerebral infarction without residual deficits: Secondary | ICD-10-CM | POA: Diagnosis not present

## 2019-03-09 DIAGNOSIS — M6281 Muscle weakness (generalized): Secondary | ICD-10-CM | POA: Diagnosis not present

## 2019-03-09 DIAGNOSIS — R262 Difficulty in walking, not elsewhere classified: Secondary | ICD-10-CM | POA: Diagnosis not present

## 2019-03-09 DIAGNOSIS — M7989 Other specified soft tissue disorders: Secondary | ICD-10-CM | POA: Diagnosis not present

## 2019-03-09 DIAGNOSIS — U071 COVID-19: Secondary | ICD-10-CM | POA: Diagnosis not present

## 2019-03-09 DIAGNOSIS — M1990 Unspecified osteoarthritis, unspecified site: Secondary | ICD-10-CM | POA: Diagnosis not present

## 2019-03-09 DIAGNOSIS — Z8673 Personal history of transient ischemic attack (TIA), and cerebral infarction without residual deficits: Secondary | ICD-10-CM | POA: Diagnosis not present

## 2019-03-09 DIAGNOSIS — I739 Peripheral vascular disease, unspecified: Secondary | ICD-10-CM | POA: Diagnosis not present

## 2019-03-10 DIAGNOSIS — I739 Peripheral vascular disease, unspecified: Secondary | ICD-10-CM | POA: Diagnosis not present

## 2019-03-10 DIAGNOSIS — M1990 Unspecified osteoarthritis, unspecified site: Secondary | ICD-10-CM | POA: Diagnosis not present

## 2019-03-10 DIAGNOSIS — M6281 Muscle weakness (generalized): Secondary | ICD-10-CM | POA: Diagnosis not present

## 2019-03-10 DIAGNOSIS — R262 Difficulty in walking, not elsewhere classified: Secondary | ICD-10-CM | POA: Diagnosis not present

## 2019-03-10 DIAGNOSIS — U071 COVID-19: Secondary | ICD-10-CM | POA: Diagnosis not present

## 2019-03-10 DIAGNOSIS — Z8673 Personal history of transient ischemic attack (TIA), and cerebral infarction without residual deficits: Secondary | ICD-10-CM | POA: Diagnosis not present

## 2019-03-14 DIAGNOSIS — I739 Peripheral vascular disease, unspecified: Secondary | ICD-10-CM | POA: Diagnosis not present

## 2019-03-14 DIAGNOSIS — M1990 Unspecified osteoarthritis, unspecified site: Secondary | ICD-10-CM | POA: Diagnosis not present

## 2019-03-14 DIAGNOSIS — U071 COVID-19: Secondary | ICD-10-CM | POA: Diagnosis not present

## 2019-03-14 DIAGNOSIS — M6281 Muscle weakness (generalized): Secondary | ICD-10-CM | POA: Diagnosis not present

## 2019-03-14 DIAGNOSIS — R262 Difficulty in walking, not elsewhere classified: Secondary | ICD-10-CM | POA: Diagnosis not present

## 2019-03-14 DIAGNOSIS — Z8673 Personal history of transient ischemic attack (TIA), and cerebral infarction without residual deficits: Secondary | ICD-10-CM | POA: Diagnosis not present

## 2019-03-16 DIAGNOSIS — M6281 Muscle weakness (generalized): Secondary | ICD-10-CM | POA: Diagnosis not present

## 2019-03-16 DIAGNOSIS — M1990 Unspecified osteoarthritis, unspecified site: Secondary | ICD-10-CM | POA: Diagnosis not present

## 2019-03-16 DIAGNOSIS — Z8673 Personal history of transient ischemic attack (TIA), and cerebral infarction without residual deficits: Secondary | ICD-10-CM | POA: Diagnosis not present

## 2019-03-16 DIAGNOSIS — U071 COVID-19: Secondary | ICD-10-CM | POA: Diagnosis not present

## 2019-03-16 DIAGNOSIS — I739 Peripheral vascular disease, unspecified: Secondary | ICD-10-CM | POA: Diagnosis not present

## 2019-03-16 DIAGNOSIS — R262 Difficulty in walking, not elsewhere classified: Secondary | ICD-10-CM | POA: Diagnosis not present

## 2019-03-17 DIAGNOSIS — R262 Difficulty in walking, not elsewhere classified: Secondary | ICD-10-CM | POA: Diagnosis not present

## 2019-03-17 DIAGNOSIS — U071 COVID-19: Secondary | ICD-10-CM | POA: Diagnosis not present

## 2019-03-17 DIAGNOSIS — I739 Peripheral vascular disease, unspecified: Secondary | ICD-10-CM | POA: Diagnosis not present

## 2019-03-17 DIAGNOSIS — M1990 Unspecified osteoarthritis, unspecified site: Secondary | ICD-10-CM | POA: Diagnosis not present

## 2019-03-17 DIAGNOSIS — M6281 Muscle weakness (generalized): Secondary | ICD-10-CM | POA: Diagnosis not present

## 2019-03-17 DIAGNOSIS — Z8673 Personal history of transient ischemic attack (TIA), and cerebral infarction without residual deficits: Secondary | ICD-10-CM | POA: Diagnosis not present

## 2019-03-21 DIAGNOSIS — F0280 Dementia in other diseases classified elsewhere without behavioral disturbance: Secondary | ICD-10-CM | POA: Diagnosis not present

## 2019-03-21 DIAGNOSIS — Z8673 Personal history of transient ischemic attack (TIA), and cerebral infarction without residual deficits: Secondary | ICD-10-CM | POA: Diagnosis not present

## 2019-03-21 DIAGNOSIS — M542 Cervicalgia: Secondary | ICD-10-CM | POA: Diagnosis not present

## 2019-03-21 DIAGNOSIS — I6529 Occlusion and stenosis of unspecified carotid artery: Secondary | ICD-10-CM | POA: Diagnosis not present

## 2019-03-23 DIAGNOSIS — M542 Cervicalgia: Secondary | ICD-10-CM | POA: Diagnosis not present

## 2019-03-23 DIAGNOSIS — Z8673 Personal history of transient ischemic attack (TIA), and cerebral infarction without residual deficits: Secondary | ICD-10-CM | POA: Diagnosis not present

## 2019-03-23 DIAGNOSIS — I6529 Occlusion and stenosis of unspecified carotid artery: Secondary | ICD-10-CM | POA: Diagnosis not present

## 2019-03-23 DIAGNOSIS — F0280 Dementia in other diseases classified elsewhere without behavioral disturbance: Secondary | ICD-10-CM | POA: Diagnosis not present

## 2019-03-24 DIAGNOSIS — M542 Cervicalgia: Secondary | ICD-10-CM | POA: Diagnosis not present

## 2019-03-24 DIAGNOSIS — I6529 Occlusion and stenosis of unspecified carotid artery: Secondary | ICD-10-CM | POA: Diagnosis not present

## 2019-03-24 DIAGNOSIS — Z8673 Personal history of transient ischemic attack (TIA), and cerebral infarction without residual deficits: Secondary | ICD-10-CM | POA: Diagnosis not present

## 2019-03-24 DIAGNOSIS — F0280 Dementia in other diseases classified elsewhere without behavioral disturbance: Secondary | ICD-10-CM | POA: Diagnosis not present

## 2019-03-28 DIAGNOSIS — Z23 Encounter for immunization: Secondary | ICD-10-CM | POA: Diagnosis not present

## 2019-04-28 DIAGNOSIS — M436 Torticollis: Secondary | ICD-10-CM | POA: Diagnosis not present

## 2019-05-03 DIAGNOSIS — I6529 Occlusion and stenosis of unspecified carotid artery: Secondary | ICD-10-CM | POA: Diagnosis not present

## 2019-05-03 DIAGNOSIS — F0280 Dementia in other diseases classified elsewhere without behavioral disturbance: Secondary | ICD-10-CM | POA: Diagnosis not present

## 2019-05-03 DIAGNOSIS — M542 Cervicalgia: Secondary | ICD-10-CM | POA: Diagnosis not present

## 2019-05-03 DIAGNOSIS — Z8673 Personal history of transient ischemic attack (TIA), and cerebral infarction without residual deficits: Secondary | ICD-10-CM | POA: Diagnosis not present

## 2019-05-05 DIAGNOSIS — M542 Cervicalgia: Secondary | ICD-10-CM | POA: Diagnosis not present

## 2019-05-05 DIAGNOSIS — I6529 Occlusion and stenosis of unspecified carotid artery: Secondary | ICD-10-CM | POA: Diagnosis not present

## 2019-05-05 DIAGNOSIS — F0280 Dementia in other diseases classified elsewhere without behavioral disturbance: Secondary | ICD-10-CM | POA: Diagnosis not present

## 2019-05-05 DIAGNOSIS — Z8673 Personal history of transient ischemic attack (TIA), and cerebral infarction without residual deficits: Secondary | ICD-10-CM | POA: Diagnosis not present

## 2019-05-11 DIAGNOSIS — I739 Peripheral vascular disease, unspecified: Secondary | ICD-10-CM | POA: Diagnosis not present

## 2019-05-11 DIAGNOSIS — Z8673 Personal history of transient ischemic attack (TIA), and cerebral infarction without residual deficits: Secondary | ICD-10-CM | POA: Diagnosis not present

## 2019-05-11 DIAGNOSIS — E039 Hypothyroidism, unspecified: Secondary | ICD-10-CM | POA: Diagnosis not present

## 2019-05-11 DIAGNOSIS — M542 Cervicalgia: Secondary | ICD-10-CM | POA: Diagnosis not present

## 2019-05-11 DIAGNOSIS — F0280 Dementia in other diseases classified elsewhere without behavioral disturbance: Secondary | ICD-10-CM | POA: Diagnosis not present

## 2019-05-11 DIAGNOSIS — I10 Essential (primary) hypertension: Secondary | ICD-10-CM | POA: Diagnosis not present

## 2019-05-11 DIAGNOSIS — N390 Urinary tract infection, site not specified: Secondary | ICD-10-CM | POA: Diagnosis not present

## 2019-05-11 DIAGNOSIS — I6529 Occlusion and stenosis of unspecified carotid artery: Secondary | ICD-10-CM | POA: Diagnosis not present

## 2019-05-11 DIAGNOSIS — I2782 Chronic pulmonary embolism: Secondary | ICD-10-CM | POA: Diagnosis not present

## 2019-05-11 DIAGNOSIS — B351 Tinea unguium: Secondary | ICD-10-CM | POA: Diagnosis not present

## 2019-05-11 DIAGNOSIS — F0150 Vascular dementia without behavioral disturbance: Secondary | ICD-10-CM | POA: Diagnosis not present

## 2019-07-12 DIAGNOSIS — I10 Essential (primary) hypertension: Secondary | ICD-10-CM | POA: Diagnosis not present

## 2019-07-12 DIAGNOSIS — F0150 Vascular dementia without behavioral disturbance: Secondary | ICD-10-CM | POA: Diagnosis not present

## 2019-07-12 DIAGNOSIS — I739 Peripheral vascular disease, unspecified: Secondary | ICD-10-CM | POA: Diagnosis not present

## 2019-07-12 DIAGNOSIS — I2782 Chronic pulmonary embolism: Secondary | ICD-10-CM | POA: Diagnosis not present

## 2019-08-08 DIAGNOSIS — R600 Localized edema: Secondary | ICD-10-CM | POA: Diagnosis not present

## 2019-08-08 DIAGNOSIS — R2232 Localized swelling, mass and lump, left upper limb: Secondary | ICD-10-CM | POA: Diagnosis not present

## 2019-08-08 DIAGNOSIS — M79632 Pain in left forearm: Secondary | ICD-10-CM | POA: Diagnosis not present

## 2019-09-07 DIAGNOSIS — N390 Urinary tract infection, site not specified: Secondary | ICD-10-CM | POA: Diagnosis not present

## 2019-09-07 DIAGNOSIS — E039 Hypothyroidism, unspecified: Secondary | ICD-10-CM | POA: Diagnosis not present

## 2019-09-07 DIAGNOSIS — F0150 Vascular dementia without behavioral disturbance: Secondary | ICD-10-CM | POA: Diagnosis not present

## 2019-09-07 DIAGNOSIS — G451 Carotid artery syndrome (hemispheric): Secondary | ICD-10-CM | POA: Diagnosis not present

## 2019-09-07 DIAGNOSIS — I10 Essential (primary) hypertension: Secondary | ICD-10-CM | POA: Diagnosis not present

## 2019-09-07 DIAGNOSIS — I2782 Chronic pulmonary embolism: Secondary | ICD-10-CM | POA: Diagnosis not present

## 2019-09-07 DIAGNOSIS — I739 Peripheral vascular disease, unspecified: Secondary | ICD-10-CM | POA: Diagnosis not present

## 2019-09-14 ENCOUNTER — Ambulatory Visit: Payer: Medicare Other | Admitting: Family Medicine

## 2019-09-22 DIAGNOSIS — I87332 Chronic venous hypertension (idiopathic) with ulcer and inflammation of left lower extremity: Secondary | ICD-10-CM | POA: Diagnosis not present

## 2019-10-24 DIAGNOSIS — F39 Unspecified mood [affective] disorder: Secondary | ICD-10-CM | POA: Diagnosis not present

## 2019-11-14 DIAGNOSIS — F0150 Vascular dementia without behavioral disturbance: Secondary | ICD-10-CM | POA: Diagnosis not present

## 2019-11-14 DIAGNOSIS — Z86711 Personal history of pulmonary embolism: Secondary | ICD-10-CM | POA: Diagnosis not present

## 2019-11-14 DIAGNOSIS — I739 Peripheral vascular disease, unspecified: Secondary | ICD-10-CM | POA: Diagnosis not present

## 2019-11-14 DIAGNOSIS — F39 Unspecified mood [affective] disorder: Secondary | ICD-10-CM | POA: Diagnosis not present

## 2019-11-24 DIAGNOSIS — D510 Vitamin B12 deficiency anemia due to intrinsic factor deficiency: Secondary | ICD-10-CM | POA: Diagnosis not present

## 2019-11-24 DIAGNOSIS — E785 Hyperlipidemia, unspecified: Secondary | ICD-10-CM | POA: Diagnosis not present

## 2019-11-24 DIAGNOSIS — E039 Hypothyroidism, unspecified: Secondary | ICD-10-CM | POA: Diagnosis not present

## 2019-11-24 DIAGNOSIS — I10 Essential (primary) hypertension: Secondary | ICD-10-CM | POA: Diagnosis not present

## 2019-11-30 DIAGNOSIS — K219 Gastro-esophageal reflux disease without esophagitis: Secondary | ICD-10-CM | POA: Diagnosis not present

## 2019-11-30 DIAGNOSIS — R0602 Shortness of breath: Secondary | ICD-10-CM | POA: Diagnosis not present

## 2019-11-30 DIAGNOSIS — R079 Chest pain, unspecified: Secondary | ICD-10-CM | POA: Diagnosis not present

## 2019-12-02 DIAGNOSIS — Z23 Encounter for immunization: Secondary | ICD-10-CM | POA: Diagnosis not present

## 2020-01-11 DIAGNOSIS — I2782 Chronic pulmonary embolism: Secondary | ICD-10-CM | POA: Diagnosis not present

## 2020-01-11 DIAGNOSIS — I10 Essential (primary) hypertension: Secondary | ICD-10-CM | POA: Diagnosis not present

## 2020-01-11 DIAGNOSIS — G451 Carotid artery syndrome (hemispheric): Secondary | ICD-10-CM | POA: Diagnosis not present

## 2020-01-11 DIAGNOSIS — F0150 Vascular dementia without behavioral disturbance: Secondary | ICD-10-CM | POA: Diagnosis not present

## 2020-01-11 DIAGNOSIS — N390 Urinary tract infection, site not specified: Secondary | ICD-10-CM | POA: Diagnosis not present

## 2020-01-11 DIAGNOSIS — I739 Peripheral vascular disease, unspecified: Secondary | ICD-10-CM | POA: Diagnosis not present

## 2020-01-11 DIAGNOSIS — E039 Hypothyroidism, unspecified: Secondary | ICD-10-CM | POA: Diagnosis not present

## 2020-01-18 DIAGNOSIS — B351 Tinea unguium: Secondary | ICD-10-CM | POA: Diagnosis not present

## 2020-03-19 DIAGNOSIS — G451 Carotid artery syndrome (hemispheric): Secondary | ICD-10-CM | POA: Diagnosis not present

## 2020-03-19 DIAGNOSIS — I739 Peripheral vascular disease, unspecified: Secondary | ICD-10-CM | POA: Diagnosis not present

## 2020-03-19 DIAGNOSIS — I2782 Chronic pulmonary embolism: Secondary | ICD-10-CM | POA: Diagnosis not present

## 2020-03-19 DIAGNOSIS — I10 Essential (primary) hypertension: Secondary | ICD-10-CM | POA: Diagnosis not present

## 2020-03-19 DIAGNOSIS — F0150 Vascular dementia without behavioral disturbance: Secondary | ICD-10-CM | POA: Diagnosis not present

## 2020-03-19 DIAGNOSIS — F39 Unspecified mood [affective] disorder: Secondary | ICD-10-CM | POA: Diagnosis not present

## 2020-04-27 DIAGNOSIS — N390 Urinary tract infection, site not specified: Secondary | ICD-10-CM | POA: Diagnosis not present

## 2020-05-16 DIAGNOSIS — N390 Urinary tract infection, site not specified: Secondary | ICD-10-CM | POA: Diagnosis not present

## 2020-05-16 DIAGNOSIS — F39 Unspecified mood [affective] disorder: Secondary | ICD-10-CM | POA: Diagnosis not present

## 2020-05-16 DIAGNOSIS — I739 Peripheral vascular disease, unspecified: Secondary | ICD-10-CM | POA: Diagnosis not present

## 2020-05-16 DIAGNOSIS — M1990 Unspecified osteoarthritis, unspecified site: Secondary | ICD-10-CM | POA: Diagnosis not present

## 2020-05-16 DIAGNOSIS — I2510 Atherosclerotic heart disease of native coronary artery without angina pectoris: Secondary | ICD-10-CM | POA: Diagnosis not present

## 2020-05-16 DIAGNOSIS — E039 Hypothyroidism, unspecified: Secondary | ICD-10-CM | POA: Diagnosis not present

## 2020-05-16 DIAGNOSIS — I2782 Chronic pulmonary embolism: Secondary | ICD-10-CM | POA: Diagnosis not present

## 2020-05-16 DIAGNOSIS — F0150 Vascular dementia without behavioral disturbance: Secondary | ICD-10-CM | POA: Diagnosis not present

## 2020-05-16 DIAGNOSIS — I10 Essential (primary) hypertension: Secondary | ICD-10-CM | POA: Diagnosis not present

## 2020-06-07 DIAGNOSIS — I10 Essential (primary) hypertension: Secondary | ICD-10-CM | POA: Diagnosis not present

## 2020-06-07 DIAGNOSIS — N3281 Overactive bladder: Secondary | ICD-10-CM | POA: Diagnosis not present

## 2020-06-07 DIAGNOSIS — R278 Other lack of coordination: Secondary | ICD-10-CM | POA: Diagnosis not present

## 2020-06-07 DIAGNOSIS — Z741 Need for assistance with personal care: Secondary | ICD-10-CM | POA: Diagnosis not present

## 2020-06-07 DIAGNOSIS — R4189 Other symptoms and signs involving cognitive functions and awareness: Secondary | ICD-10-CM | POA: Diagnosis not present

## 2020-06-07 DIAGNOSIS — Z8673 Personal history of transient ischemic attack (TIA), and cerebral infarction without residual deficits: Secondary | ICD-10-CM | POA: Diagnosis not present

## 2020-06-07 DIAGNOSIS — G309 Alzheimer's disease, unspecified: Secondary | ICD-10-CM | POA: Diagnosis not present

## 2020-06-08 DIAGNOSIS — Z23 Encounter for immunization: Secondary | ICD-10-CM | POA: Diagnosis not present

## 2020-06-12 DIAGNOSIS — N3281 Overactive bladder: Secondary | ICD-10-CM | POA: Diagnosis not present

## 2020-06-12 DIAGNOSIS — G309 Alzheimer's disease, unspecified: Secondary | ICD-10-CM | POA: Diagnosis not present

## 2020-06-12 DIAGNOSIS — I10 Essential (primary) hypertension: Secondary | ICD-10-CM | POA: Diagnosis not present

## 2020-06-12 DIAGNOSIS — Z8673 Personal history of transient ischemic attack (TIA), and cerebral infarction without residual deficits: Secondary | ICD-10-CM | POA: Diagnosis not present

## 2020-06-12 DIAGNOSIS — Z741 Need for assistance with personal care: Secondary | ICD-10-CM | POA: Diagnosis not present

## 2020-06-12 DIAGNOSIS — R4189 Other symptoms and signs involving cognitive functions and awareness: Secondary | ICD-10-CM | POA: Diagnosis not present

## 2020-06-14 DIAGNOSIS — G309 Alzheimer's disease, unspecified: Secondary | ICD-10-CM | POA: Diagnosis not present

## 2020-06-14 DIAGNOSIS — R4189 Other symptoms and signs involving cognitive functions and awareness: Secondary | ICD-10-CM | POA: Diagnosis not present

## 2020-06-14 DIAGNOSIS — Z741 Need for assistance with personal care: Secondary | ICD-10-CM | POA: Diagnosis not present

## 2020-06-14 DIAGNOSIS — I10 Essential (primary) hypertension: Secondary | ICD-10-CM | POA: Diagnosis not present

## 2020-06-14 DIAGNOSIS — Z8673 Personal history of transient ischemic attack (TIA), and cerebral infarction without residual deficits: Secondary | ICD-10-CM | POA: Diagnosis not present

## 2020-06-14 DIAGNOSIS — N3281 Overactive bladder: Secondary | ICD-10-CM | POA: Diagnosis not present

## 2020-06-18 DIAGNOSIS — R4189 Other symptoms and signs involving cognitive functions and awareness: Secondary | ICD-10-CM | POA: Diagnosis not present

## 2020-06-18 DIAGNOSIS — G309 Alzheimer's disease, unspecified: Secondary | ICD-10-CM | POA: Diagnosis not present

## 2020-06-18 DIAGNOSIS — Z8673 Personal history of transient ischemic attack (TIA), and cerebral infarction without residual deficits: Secondary | ICD-10-CM | POA: Diagnosis not present

## 2020-06-18 DIAGNOSIS — N3281 Overactive bladder: Secondary | ICD-10-CM | POA: Diagnosis not present

## 2020-06-18 DIAGNOSIS — Z741 Need for assistance with personal care: Secondary | ICD-10-CM | POA: Diagnosis not present

## 2020-06-18 DIAGNOSIS — I10 Essential (primary) hypertension: Secondary | ICD-10-CM | POA: Diagnosis not present

## 2020-06-19 DIAGNOSIS — I10 Essential (primary) hypertension: Secondary | ICD-10-CM | POA: Diagnosis not present

## 2020-06-19 DIAGNOSIS — Z8673 Personal history of transient ischemic attack (TIA), and cerebral infarction without residual deficits: Secondary | ICD-10-CM | POA: Diagnosis not present

## 2020-06-19 DIAGNOSIS — R4189 Other symptoms and signs involving cognitive functions and awareness: Secondary | ICD-10-CM | POA: Diagnosis not present

## 2020-06-19 DIAGNOSIS — G309 Alzheimer's disease, unspecified: Secondary | ICD-10-CM | POA: Diagnosis not present

## 2020-06-19 DIAGNOSIS — Z741 Need for assistance with personal care: Secondary | ICD-10-CM | POA: Diagnosis not present

## 2020-06-19 DIAGNOSIS — N3281 Overactive bladder: Secondary | ICD-10-CM | POA: Diagnosis not present

## 2020-07-17 DIAGNOSIS — F0150 Vascular dementia without behavioral disturbance: Secondary | ICD-10-CM | POA: Diagnosis not present

## 2020-07-17 DIAGNOSIS — I10 Essential (primary) hypertension: Secondary | ICD-10-CM | POA: Diagnosis not present

## 2020-07-17 DIAGNOSIS — Z87440 Personal history of urinary (tract) infections: Secondary | ICD-10-CM | POA: Diagnosis not present

## 2020-07-17 DIAGNOSIS — I739 Peripheral vascular disease, unspecified: Secondary | ICD-10-CM | POA: Diagnosis not present

## 2020-07-17 DIAGNOSIS — F39 Unspecified mood [affective] disorder: Secondary | ICD-10-CM | POA: Diagnosis not present

## 2020-07-17 DIAGNOSIS — E039 Hypothyroidism, unspecified: Secondary | ICD-10-CM | POA: Diagnosis not present

## 2020-09-14 DIAGNOSIS — F39 Unspecified mood [affective] disorder: Secondary | ICD-10-CM | POA: Diagnosis not present

## 2020-09-14 DIAGNOSIS — E039 Hypothyroidism, unspecified: Secondary | ICD-10-CM | POA: Diagnosis not present

## 2020-09-14 DIAGNOSIS — I739 Peripheral vascular disease, unspecified: Secondary | ICD-10-CM | POA: Diagnosis not present

## 2020-09-14 DIAGNOSIS — F0150 Vascular dementia without behavioral disturbance: Secondary | ICD-10-CM | POA: Diagnosis not present

## 2020-09-14 DIAGNOSIS — N390 Urinary tract infection, site not specified: Secondary | ICD-10-CM | POA: Diagnosis not present

## 2020-09-14 DIAGNOSIS — I6529 Occlusion and stenosis of unspecified carotid artery: Secondary | ICD-10-CM | POA: Diagnosis not present

## 2020-09-14 DIAGNOSIS — I10 Essential (primary) hypertension: Secondary | ICD-10-CM | POA: Diagnosis not present

## 2020-10-12 DIAGNOSIS — Z23 Encounter for immunization: Secondary | ICD-10-CM | POA: Diagnosis not present

## 2020-11-15 DIAGNOSIS — F39 Unspecified mood [affective] disorder: Secondary | ICD-10-CM | POA: Diagnosis not present

## 2020-11-15 DIAGNOSIS — Z87440 Personal history of urinary (tract) infections: Secondary | ICD-10-CM | POA: Diagnosis not present

## 2020-11-15 DIAGNOSIS — I739 Peripheral vascular disease, unspecified: Secondary | ICD-10-CM | POA: Diagnosis not present

## 2020-11-15 DIAGNOSIS — I10 Essential (primary) hypertension: Secondary | ICD-10-CM | POA: Diagnosis not present

## 2020-11-15 DIAGNOSIS — F0150 Vascular dementia without behavioral disturbance: Secondary | ICD-10-CM | POA: Diagnosis not present

## 2020-11-15 DIAGNOSIS — I2782 Chronic pulmonary embolism: Secondary | ICD-10-CM | POA: Diagnosis not present

## 2020-11-22 DIAGNOSIS — E039 Hypothyroidism, unspecified: Secondary | ICD-10-CM | POA: Diagnosis not present

## 2020-11-22 DIAGNOSIS — I10 Essential (primary) hypertension: Secondary | ICD-10-CM | POA: Diagnosis not present

## 2020-11-22 DIAGNOSIS — E785 Hyperlipidemia, unspecified: Secondary | ICD-10-CM | POA: Diagnosis not present

## 2020-11-22 DIAGNOSIS — D510 Vitamin B12 deficiency anemia due to intrinsic factor deficiency: Secondary | ICD-10-CM | POA: Diagnosis not present

## 2020-11-23 LAB — LIPID PANEL
Cholesterol: 193 (ref 0–200)
HDL: 42 (ref 35–70)
LDL Cholesterol: 125
Triglycerides: 144 (ref 40–160)

## 2020-11-23 LAB — CBC: RBC: 4.35 (ref 3.87–5.11)

## 2020-11-23 LAB — BASIC METABOLIC PANEL
BUN: 20 (ref 4–21)
CO2: 31 — AB (ref 13–22)
Chloride: 101 (ref 99–108)
Creatinine: 0.7 (ref 0.5–1.1)
Glucose: 94
Potassium: 4.6 mEq/L (ref 3.5–5.1)
Sodium: 137 (ref 137–147)

## 2020-11-23 LAB — HEPATIC FUNCTION PANEL
ALT: 8 U/L (ref 7–35)
AST: 13 (ref 13–35)
Alkaline Phosphatase: 46 (ref 25–125)
Bilirubin, Total: 0.3

## 2020-11-23 LAB — COMPREHENSIVE METABOLIC PANEL
Albumin: 3.9 (ref 3.5–5.0)
Calcium: 9.9 (ref 8.7–10.7)
Globulin: 3
eGFR: 83

## 2020-11-23 LAB — CBC AND DIFFERENTIAL
HCT: 41 (ref 36–46)
Hemoglobin: 13.7 (ref 12.0–16.0)
Neutrophils Absolute: 3300
Platelets: 278 10*3/uL (ref 150–400)
WBC: 6

## 2020-11-23 LAB — VITAMIN B12: Vitamin B-12: >2000

## 2020-12-03 DIAGNOSIS — R2689 Other abnormalities of gait and mobility: Secondary | ICD-10-CM | POA: Diagnosis not present

## 2020-12-03 DIAGNOSIS — I739 Peripheral vascular disease, unspecified: Secondary | ICD-10-CM | POA: Diagnosis not present

## 2020-12-03 DIAGNOSIS — M6281 Muscle weakness (generalized): Secondary | ICD-10-CM | POA: Diagnosis not present

## 2020-12-03 DIAGNOSIS — R4189 Other symptoms and signs involving cognitive functions and awareness: Secondary | ICD-10-CM | POA: Diagnosis not present

## 2020-12-03 DIAGNOSIS — R278 Other lack of coordination: Secondary | ICD-10-CM | POA: Diagnosis not present

## 2020-12-03 DIAGNOSIS — Z741 Need for assistance with personal care: Secondary | ICD-10-CM | POA: Diagnosis not present

## 2020-12-03 DIAGNOSIS — G309 Alzheimer's disease, unspecified: Secondary | ICD-10-CM | POA: Diagnosis not present

## 2020-12-03 DIAGNOSIS — F0280 Dementia in other diseases classified elsewhere without behavioral disturbance: Secondary | ICD-10-CM | POA: Diagnosis not present

## 2020-12-03 DIAGNOSIS — I10 Essential (primary) hypertension: Secondary | ICD-10-CM | POA: Diagnosis not present

## 2020-12-03 DIAGNOSIS — M1990 Unspecified osteoarthritis, unspecified site: Secondary | ICD-10-CM | POA: Diagnosis not present

## 2020-12-03 DIAGNOSIS — N3281 Overactive bladder: Secondary | ICD-10-CM | POA: Diagnosis not present

## 2020-12-03 DIAGNOSIS — M542 Cervicalgia: Secondary | ICD-10-CM | POA: Diagnosis not present

## 2020-12-03 DIAGNOSIS — Z8673 Personal history of transient ischemic attack (TIA), and cerebral infarction without residual deficits: Secondary | ICD-10-CM | POA: Diagnosis not present

## 2020-12-03 DIAGNOSIS — R262 Difficulty in walking, not elsewhere classified: Secondary | ICD-10-CM | POA: Diagnosis not present

## 2020-12-03 DIAGNOSIS — I6529 Occlusion and stenosis of unspecified carotid artery: Secondary | ICD-10-CM | POA: Diagnosis not present

## 2020-12-03 DIAGNOSIS — R2681 Unsteadiness on feet: Secondary | ICD-10-CM | POA: Diagnosis not present

## 2020-12-05 DIAGNOSIS — Z8673 Personal history of transient ischemic attack (TIA), and cerebral infarction without residual deficits: Secondary | ICD-10-CM | POA: Diagnosis not present

## 2020-12-05 DIAGNOSIS — G309 Alzheimer's disease, unspecified: Secondary | ICD-10-CM | POA: Diagnosis not present

## 2020-12-05 DIAGNOSIS — I739 Peripheral vascular disease, unspecified: Secondary | ICD-10-CM | POA: Diagnosis not present

## 2020-12-05 DIAGNOSIS — I10 Essential (primary) hypertension: Secondary | ICD-10-CM | POA: Diagnosis not present

## 2020-12-05 DIAGNOSIS — N3281 Overactive bladder: Secondary | ICD-10-CM | POA: Diagnosis not present

## 2020-12-05 DIAGNOSIS — M1990 Unspecified osteoarthritis, unspecified site: Secondary | ICD-10-CM | POA: Diagnosis not present

## 2020-12-09 DIAGNOSIS — I739 Peripheral vascular disease, unspecified: Secondary | ICD-10-CM | POA: Diagnosis not present

## 2020-12-09 DIAGNOSIS — I10 Essential (primary) hypertension: Secondary | ICD-10-CM | POA: Diagnosis not present

## 2020-12-09 DIAGNOSIS — M1990 Unspecified osteoarthritis, unspecified site: Secondary | ICD-10-CM | POA: Diagnosis not present

## 2020-12-09 DIAGNOSIS — N3281 Overactive bladder: Secondary | ICD-10-CM | POA: Diagnosis not present

## 2020-12-09 DIAGNOSIS — G309 Alzheimer's disease, unspecified: Secondary | ICD-10-CM | POA: Diagnosis not present

## 2020-12-09 DIAGNOSIS — Z8673 Personal history of transient ischemic attack (TIA), and cerebral infarction without residual deficits: Secondary | ICD-10-CM | POA: Diagnosis not present

## 2020-12-10 DIAGNOSIS — Z8673 Personal history of transient ischemic attack (TIA), and cerebral infarction without residual deficits: Secondary | ICD-10-CM | POA: Diagnosis not present

## 2020-12-10 DIAGNOSIS — I10 Essential (primary) hypertension: Secondary | ICD-10-CM | POA: Diagnosis not present

## 2020-12-10 DIAGNOSIS — I739 Peripheral vascular disease, unspecified: Secondary | ICD-10-CM | POA: Diagnosis not present

## 2020-12-10 DIAGNOSIS — G309 Alzheimer's disease, unspecified: Secondary | ICD-10-CM | POA: Diagnosis not present

## 2020-12-10 DIAGNOSIS — M1990 Unspecified osteoarthritis, unspecified site: Secondary | ICD-10-CM | POA: Diagnosis not present

## 2020-12-10 DIAGNOSIS — N3281 Overactive bladder: Secondary | ICD-10-CM | POA: Diagnosis not present

## 2020-12-13 DIAGNOSIS — M1990 Unspecified osteoarthritis, unspecified site: Secondary | ICD-10-CM | POA: Diagnosis not present

## 2020-12-13 DIAGNOSIS — G309 Alzheimer's disease, unspecified: Secondary | ICD-10-CM | POA: Diagnosis not present

## 2020-12-13 DIAGNOSIS — I739 Peripheral vascular disease, unspecified: Secondary | ICD-10-CM | POA: Diagnosis not present

## 2020-12-13 DIAGNOSIS — N3281 Overactive bladder: Secondary | ICD-10-CM | POA: Diagnosis not present

## 2020-12-13 DIAGNOSIS — I10 Essential (primary) hypertension: Secondary | ICD-10-CM | POA: Diagnosis not present

## 2020-12-13 DIAGNOSIS — Z8673 Personal history of transient ischemic attack (TIA), and cerebral infarction without residual deficits: Secondary | ICD-10-CM | POA: Diagnosis not present

## 2020-12-17 DIAGNOSIS — I10 Essential (primary) hypertension: Secondary | ICD-10-CM | POA: Diagnosis not present

## 2020-12-17 DIAGNOSIS — Z8673 Personal history of transient ischemic attack (TIA), and cerebral infarction without residual deficits: Secondary | ICD-10-CM | POA: Diagnosis not present

## 2020-12-17 DIAGNOSIS — N3281 Overactive bladder: Secondary | ICD-10-CM | POA: Diagnosis not present

## 2020-12-17 DIAGNOSIS — G309 Alzheimer's disease, unspecified: Secondary | ICD-10-CM | POA: Diagnosis not present

## 2020-12-17 DIAGNOSIS — M1990 Unspecified osteoarthritis, unspecified site: Secondary | ICD-10-CM | POA: Diagnosis not present

## 2020-12-17 DIAGNOSIS — I739 Peripheral vascular disease, unspecified: Secondary | ICD-10-CM | POA: Diagnosis not present

## 2020-12-20 DIAGNOSIS — M1990 Unspecified osteoarthritis, unspecified site: Secondary | ICD-10-CM | POA: Diagnosis not present

## 2020-12-20 DIAGNOSIS — R262 Difficulty in walking, not elsewhere classified: Secondary | ICD-10-CM | POA: Diagnosis not present

## 2020-12-20 DIAGNOSIS — R278 Other lack of coordination: Secondary | ICD-10-CM | POA: Diagnosis not present

## 2020-12-20 DIAGNOSIS — M6281 Muscle weakness (generalized): Secondary | ICD-10-CM | POA: Diagnosis not present

## 2020-12-20 DIAGNOSIS — G309 Alzheimer's disease, unspecified: Secondary | ICD-10-CM | POA: Diagnosis not present

## 2020-12-22 DIAGNOSIS — M6281 Muscle weakness (generalized): Secondary | ICD-10-CM | POA: Diagnosis not present

## 2020-12-22 DIAGNOSIS — R278 Other lack of coordination: Secondary | ICD-10-CM | POA: Diagnosis not present

## 2020-12-22 DIAGNOSIS — R262 Difficulty in walking, not elsewhere classified: Secondary | ICD-10-CM | POA: Diagnosis not present

## 2020-12-22 DIAGNOSIS — G309 Alzheimer's disease, unspecified: Secondary | ICD-10-CM | POA: Diagnosis not present

## 2020-12-22 DIAGNOSIS — M1990 Unspecified osteoarthritis, unspecified site: Secondary | ICD-10-CM | POA: Diagnosis not present

## 2020-12-24 DIAGNOSIS — G309 Alzheimer's disease, unspecified: Secondary | ICD-10-CM | POA: Diagnosis not present

## 2020-12-24 DIAGNOSIS — R262 Difficulty in walking, not elsewhere classified: Secondary | ICD-10-CM | POA: Diagnosis not present

## 2020-12-24 DIAGNOSIS — R278 Other lack of coordination: Secondary | ICD-10-CM | POA: Diagnosis not present

## 2020-12-24 DIAGNOSIS — M6281 Muscle weakness (generalized): Secondary | ICD-10-CM | POA: Diagnosis not present

## 2020-12-24 DIAGNOSIS — M1990 Unspecified osteoarthritis, unspecified site: Secondary | ICD-10-CM | POA: Diagnosis not present

## 2020-12-27 DIAGNOSIS — M6281 Muscle weakness (generalized): Secondary | ICD-10-CM | POA: Diagnosis not present

## 2020-12-27 DIAGNOSIS — M1990 Unspecified osteoarthritis, unspecified site: Secondary | ICD-10-CM | POA: Diagnosis not present

## 2020-12-27 DIAGNOSIS — R278 Other lack of coordination: Secondary | ICD-10-CM | POA: Diagnosis not present

## 2020-12-27 DIAGNOSIS — G309 Alzheimer's disease, unspecified: Secondary | ICD-10-CM | POA: Diagnosis not present

## 2020-12-27 DIAGNOSIS — R262 Difficulty in walking, not elsewhere classified: Secondary | ICD-10-CM | POA: Diagnosis not present

## 2020-12-29 DIAGNOSIS — R278 Other lack of coordination: Secondary | ICD-10-CM | POA: Diagnosis not present

## 2020-12-29 DIAGNOSIS — M6281 Muscle weakness (generalized): Secondary | ICD-10-CM | POA: Diagnosis not present

## 2020-12-29 DIAGNOSIS — M1990 Unspecified osteoarthritis, unspecified site: Secondary | ICD-10-CM | POA: Diagnosis not present

## 2020-12-29 DIAGNOSIS — G309 Alzheimer's disease, unspecified: Secondary | ICD-10-CM | POA: Diagnosis not present

## 2020-12-29 DIAGNOSIS — R262 Difficulty in walking, not elsewhere classified: Secondary | ICD-10-CM | POA: Diagnosis not present

## 2020-12-31 DIAGNOSIS — M6281 Muscle weakness (generalized): Secondary | ICD-10-CM | POA: Diagnosis not present

## 2020-12-31 DIAGNOSIS — R262 Difficulty in walking, not elsewhere classified: Secondary | ICD-10-CM | POA: Diagnosis not present

## 2020-12-31 DIAGNOSIS — R278 Other lack of coordination: Secondary | ICD-10-CM | POA: Diagnosis not present

## 2020-12-31 DIAGNOSIS — M1990 Unspecified osteoarthritis, unspecified site: Secondary | ICD-10-CM | POA: Diagnosis not present

## 2020-12-31 DIAGNOSIS — G309 Alzheimer's disease, unspecified: Secondary | ICD-10-CM | POA: Diagnosis not present

## 2021-01-03 DIAGNOSIS — R278 Other lack of coordination: Secondary | ICD-10-CM | POA: Diagnosis not present

## 2021-01-03 DIAGNOSIS — G309 Alzheimer's disease, unspecified: Secondary | ICD-10-CM | POA: Diagnosis not present

## 2021-01-03 DIAGNOSIS — R262 Difficulty in walking, not elsewhere classified: Secondary | ICD-10-CM | POA: Diagnosis not present

## 2021-01-03 DIAGNOSIS — M6281 Muscle weakness (generalized): Secondary | ICD-10-CM | POA: Diagnosis not present

## 2021-01-03 DIAGNOSIS — M1990 Unspecified osteoarthritis, unspecified site: Secondary | ICD-10-CM | POA: Diagnosis not present

## 2021-01-09 DIAGNOSIS — M1990 Unspecified osteoarthritis, unspecified site: Secondary | ICD-10-CM | POA: Diagnosis not present

## 2021-01-09 DIAGNOSIS — E039 Hypothyroidism, unspecified: Secondary | ICD-10-CM | POA: Diagnosis not present

## 2021-01-09 DIAGNOSIS — R278 Other lack of coordination: Secondary | ICD-10-CM | POA: Diagnosis not present

## 2021-01-09 DIAGNOSIS — M6281 Muscle weakness (generalized): Secondary | ICD-10-CM | POA: Diagnosis not present

## 2021-01-09 DIAGNOSIS — I10 Essential (primary) hypertension: Secondary | ICD-10-CM | POA: Diagnosis not present

## 2021-01-09 DIAGNOSIS — I2782 Chronic pulmonary embolism: Secondary | ICD-10-CM | POA: Diagnosis not present

## 2021-01-09 DIAGNOSIS — F0150 Vascular dementia without behavioral disturbance: Secondary | ICD-10-CM | POA: Diagnosis not present

## 2021-01-09 DIAGNOSIS — N390 Urinary tract infection, site not specified: Secondary | ICD-10-CM | POA: Diagnosis not present

## 2021-01-09 DIAGNOSIS — R262 Difficulty in walking, not elsewhere classified: Secondary | ICD-10-CM | POA: Diagnosis not present

## 2021-01-09 DIAGNOSIS — I6529 Occlusion and stenosis of unspecified carotid artery: Secondary | ICD-10-CM | POA: Diagnosis not present

## 2021-01-09 DIAGNOSIS — I739 Peripheral vascular disease, unspecified: Secondary | ICD-10-CM | POA: Diagnosis not present

## 2021-01-09 DIAGNOSIS — G309 Alzheimer's disease, unspecified: Secondary | ICD-10-CM | POA: Diagnosis not present

## 2021-01-10 DIAGNOSIS — G309 Alzheimer's disease, unspecified: Secondary | ICD-10-CM | POA: Diagnosis not present

## 2021-01-10 DIAGNOSIS — M1990 Unspecified osteoarthritis, unspecified site: Secondary | ICD-10-CM | POA: Diagnosis not present

## 2021-01-10 DIAGNOSIS — M6281 Muscle weakness (generalized): Secondary | ICD-10-CM | POA: Diagnosis not present

## 2021-01-10 DIAGNOSIS — R262 Difficulty in walking, not elsewhere classified: Secondary | ICD-10-CM | POA: Diagnosis not present

## 2021-01-10 DIAGNOSIS — R278 Other lack of coordination: Secondary | ICD-10-CM | POA: Diagnosis not present

## 2021-01-11 DIAGNOSIS — R278 Other lack of coordination: Secondary | ICD-10-CM | POA: Diagnosis not present

## 2021-01-11 DIAGNOSIS — M6281 Muscle weakness (generalized): Secondary | ICD-10-CM | POA: Diagnosis not present

## 2021-01-11 DIAGNOSIS — R262 Difficulty in walking, not elsewhere classified: Secondary | ICD-10-CM | POA: Diagnosis not present

## 2021-01-11 DIAGNOSIS — G309 Alzheimer's disease, unspecified: Secondary | ICD-10-CM | POA: Diagnosis not present

## 2021-01-11 DIAGNOSIS — M1990 Unspecified osteoarthritis, unspecified site: Secondary | ICD-10-CM | POA: Diagnosis not present

## 2021-01-16 DIAGNOSIS — G309 Alzheimer's disease, unspecified: Secondary | ICD-10-CM | POA: Diagnosis not present

## 2021-01-16 DIAGNOSIS — M1990 Unspecified osteoarthritis, unspecified site: Secondary | ICD-10-CM | POA: Diagnosis not present

## 2021-01-16 DIAGNOSIS — R262 Difficulty in walking, not elsewhere classified: Secondary | ICD-10-CM | POA: Diagnosis not present

## 2021-01-16 DIAGNOSIS — M6281 Muscle weakness (generalized): Secondary | ICD-10-CM | POA: Diagnosis not present

## 2021-01-16 DIAGNOSIS — R278 Other lack of coordination: Secondary | ICD-10-CM | POA: Diagnosis not present

## 2021-01-19 DIAGNOSIS — R278 Other lack of coordination: Secondary | ICD-10-CM | POA: Diagnosis not present

## 2021-01-19 DIAGNOSIS — M6281 Muscle weakness (generalized): Secondary | ICD-10-CM | POA: Diagnosis not present

## 2021-01-19 DIAGNOSIS — M1990 Unspecified osteoarthritis, unspecified site: Secondary | ICD-10-CM | POA: Diagnosis not present

## 2021-01-19 DIAGNOSIS — G309 Alzheimer's disease, unspecified: Secondary | ICD-10-CM | POA: Diagnosis not present

## 2021-01-19 DIAGNOSIS — R262 Difficulty in walking, not elsewhere classified: Secondary | ICD-10-CM | POA: Diagnosis not present

## 2021-01-21 DIAGNOSIS — M6281 Muscle weakness (generalized): Secondary | ICD-10-CM | POA: Diagnosis not present

## 2021-01-21 DIAGNOSIS — R278 Other lack of coordination: Secondary | ICD-10-CM | POA: Diagnosis not present

## 2021-01-21 DIAGNOSIS — G309 Alzheimer's disease, unspecified: Secondary | ICD-10-CM | POA: Diagnosis not present

## 2021-01-21 DIAGNOSIS — R262 Difficulty in walking, not elsewhere classified: Secondary | ICD-10-CM | POA: Diagnosis not present

## 2021-01-21 DIAGNOSIS — M1990 Unspecified osteoarthritis, unspecified site: Secondary | ICD-10-CM | POA: Diagnosis not present

## 2021-01-23 DIAGNOSIS — M6281 Muscle weakness (generalized): Secondary | ICD-10-CM | POA: Diagnosis not present

## 2021-01-23 DIAGNOSIS — R278 Other lack of coordination: Secondary | ICD-10-CM | POA: Diagnosis not present

## 2021-01-23 DIAGNOSIS — M1990 Unspecified osteoarthritis, unspecified site: Secondary | ICD-10-CM | POA: Diagnosis not present

## 2021-01-23 DIAGNOSIS — G309 Alzheimer's disease, unspecified: Secondary | ICD-10-CM | POA: Diagnosis not present

## 2021-01-23 DIAGNOSIS — R262 Difficulty in walking, not elsewhere classified: Secondary | ICD-10-CM | POA: Diagnosis not present

## 2021-01-24 DIAGNOSIS — G309 Alzheimer's disease, unspecified: Secondary | ICD-10-CM | POA: Diagnosis not present

## 2021-01-24 DIAGNOSIS — M6281 Muscle weakness (generalized): Secondary | ICD-10-CM | POA: Diagnosis not present

## 2021-01-24 DIAGNOSIS — M1990 Unspecified osteoarthritis, unspecified site: Secondary | ICD-10-CM | POA: Diagnosis not present

## 2021-01-24 DIAGNOSIS — R262 Difficulty in walking, not elsewhere classified: Secondary | ICD-10-CM | POA: Diagnosis not present

## 2021-01-24 DIAGNOSIS — R278 Other lack of coordination: Secondary | ICD-10-CM | POA: Diagnosis not present

## 2021-01-28 DIAGNOSIS — R278 Other lack of coordination: Secondary | ICD-10-CM | POA: Diagnosis not present

## 2021-01-28 DIAGNOSIS — M1990 Unspecified osteoarthritis, unspecified site: Secondary | ICD-10-CM | POA: Diagnosis not present

## 2021-01-28 DIAGNOSIS — R262 Difficulty in walking, not elsewhere classified: Secondary | ICD-10-CM | POA: Diagnosis not present

## 2021-01-28 DIAGNOSIS — G309 Alzheimer's disease, unspecified: Secondary | ICD-10-CM | POA: Diagnosis not present

## 2021-01-28 DIAGNOSIS — M6281 Muscle weakness (generalized): Secondary | ICD-10-CM | POA: Diagnosis not present

## 2021-01-31 DIAGNOSIS — R278 Other lack of coordination: Secondary | ICD-10-CM | POA: Diagnosis not present

## 2021-01-31 DIAGNOSIS — G309 Alzheimer's disease, unspecified: Secondary | ICD-10-CM | POA: Diagnosis not present

## 2021-01-31 DIAGNOSIS — R262 Difficulty in walking, not elsewhere classified: Secondary | ICD-10-CM | POA: Diagnosis not present

## 2021-01-31 DIAGNOSIS — M1990 Unspecified osteoarthritis, unspecified site: Secondary | ICD-10-CM | POA: Diagnosis not present

## 2021-01-31 DIAGNOSIS — M6281 Muscle weakness (generalized): Secondary | ICD-10-CM | POA: Diagnosis not present

## 2021-02-01 DIAGNOSIS — G309 Alzheimer's disease, unspecified: Secondary | ICD-10-CM | POA: Diagnosis not present

## 2021-02-01 DIAGNOSIS — R262 Difficulty in walking, not elsewhere classified: Secondary | ICD-10-CM | POA: Diagnosis not present

## 2021-02-01 DIAGNOSIS — M6281 Muscle weakness (generalized): Secondary | ICD-10-CM | POA: Diagnosis not present

## 2021-02-01 DIAGNOSIS — R278 Other lack of coordination: Secondary | ICD-10-CM | POA: Diagnosis not present

## 2021-02-01 DIAGNOSIS — M1990 Unspecified osteoarthritis, unspecified site: Secondary | ICD-10-CM | POA: Diagnosis not present

## 2021-02-04 DIAGNOSIS — R278 Other lack of coordination: Secondary | ICD-10-CM | POA: Diagnosis not present

## 2021-02-04 DIAGNOSIS — M1990 Unspecified osteoarthritis, unspecified site: Secondary | ICD-10-CM | POA: Diagnosis not present

## 2021-02-04 DIAGNOSIS — G309 Alzheimer's disease, unspecified: Secondary | ICD-10-CM | POA: Diagnosis not present

## 2021-02-04 DIAGNOSIS — M6281 Muscle weakness (generalized): Secondary | ICD-10-CM | POA: Diagnosis not present

## 2021-02-04 DIAGNOSIS — R262 Difficulty in walking, not elsewhere classified: Secondary | ICD-10-CM | POA: Diagnosis not present

## 2021-02-05 DIAGNOSIS — M6281 Muscle weakness (generalized): Secondary | ICD-10-CM | POA: Diagnosis not present

## 2021-02-05 DIAGNOSIS — M1990 Unspecified osteoarthritis, unspecified site: Secondary | ICD-10-CM | POA: Diagnosis not present

## 2021-02-05 DIAGNOSIS — R278 Other lack of coordination: Secondary | ICD-10-CM | POA: Diagnosis not present

## 2021-02-05 DIAGNOSIS — G309 Alzheimer's disease, unspecified: Secondary | ICD-10-CM | POA: Diagnosis not present

## 2021-02-05 DIAGNOSIS — R262 Difficulty in walking, not elsewhere classified: Secondary | ICD-10-CM | POA: Diagnosis not present

## 2021-02-07 DIAGNOSIS — R278 Other lack of coordination: Secondary | ICD-10-CM | POA: Diagnosis not present

## 2021-02-07 DIAGNOSIS — R262 Difficulty in walking, not elsewhere classified: Secondary | ICD-10-CM | POA: Diagnosis not present

## 2021-02-07 DIAGNOSIS — G309 Alzheimer's disease, unspecified: Secondary | ICD-10-CM | POA: Diagnosis not present

## 2021-02-07 DIAGNOSIS — M1990 Unspecified osteoarthritis, unspecified site: Secondary | ICD-10-CM | POA: Diagnosis not present

## 2021-02-07 DIAGNOSIS — M6281 Muscle weakness (generalized): Secondary | ICD-10-CM | POA: Diagnosis not present

## 2021-02-11 DIAGNOSIS — M6281 Muscle weakness (generalized): Secondary | ICD-10-CM | POA: Diagnosis not present

## 2021-02-11 DIAGNOSIS — M1990 Unspecified osteoarthritis, unspecified site: Secondary | ICD-10-CM | POA: Diagnosis not present

## 2021-02-11 DIAGNOSIS — R278 Other lack of coordination: Secondary | ICD-10-CM | POA: Diagnosis not present

## 2021-02-11 DIAGNOSIS — G309 Alzheimer's disease, unspecified: Secondary | ICD-10-CM | POA: Diagnosis not present

## 2021-02-11 DIAGNOSIS — R262 Difficulty in walking, not elsewhere classified: Secondary | ICD-10-CM | POA: Diagnosis not present

## 2021-03-18 DIAGNOSIS — M159 Polyosteoarthritis, unspecified: Secondary | ICD-10-CM | POA: Diagnosis not present

## 2021-03-18 DIAGNOSIS — I739 Peripheral vascular disease, unspecified: Secondary | ICD-10-CM | POA: Diagnosis not present

## 2021-03-18 DIAGNOSIS — F0150 Vascular dementia without behavioral disturbance: Secondary | ICD-10-CM | POA: Diagnosis not present

## 2021-03-18 DIAGNOSIS — E039 Hypothyroidism, unspecified: Secondary | ICD-10-CM | POA: Diagnosis not present

## 2021-03-18 DIAGNOSIS — F39 Unspecified mood [affective] disorder: Secondary | ICD-10-CM | POA: Diagnosis not present

## 2021-03-18 DIAGNOSIS — I2782 Chronic pulmonary embolism: Secondary | ICD-10-CM | POA: Diagnosis not present

## 2021-04-05 DIAGNOSIS — B351 Tinea unguium: Secondary | ICD-10-CM | POA: Diagnosis not present

## 2021-04-29 DIAGNOSIS — I7091 Generalized atherosclerosis: Secondary | ICD-10-CM | POA: Diagnosis not present

## 2021-04-29 DIAGNOSIS — B351 Tinea unguium: Secondary | ICD-10-CM | POA: Diagnosis not present

## 2021-04-29 DIAGNOSIS — M2041 Other hammer toe(s) (acquired), right foot: Secondary | ICD-10-CM | POA: Diagnosis not present

## 2021-04-29 DIAGNOSIS — L603 Nail dystrophy: Secondary | ICD-10-CM | POA: Diagnosis not present

## 2021-04-29 DIAGNOSIS — M2042 Other hammer toe(s) (acquired), left foot: Secondary | ICD-10-CM | POA: Diagnosis not present

## 2021-05-15 DIAGNOSIS — M1990 Unspecified osteoarthritis, unspecified site: Secondary | ICD-10-CM | POA: Diagnosis not present

## 2021-05-15 DIAGNOSIS — I739 Peripheral vascular disease, unspecified: Secondary | ICD-10-CM | POA: Diagnosis not present

## 2021-05-15 DIAGNOSIS — N390 Urinary tract infection, site not specified: Secondary | ICD-10-CM | POA: Diagnosis not present

## 2021-05-15 DIAGNOSIS — F0150 Vascular dementia without behavioral disturbance: Secondary | ICD-10-CM | POA: Diagnosis not present

## 2021-05-15 DIAGNOSIS — F39 Unspecified mood [affective] disorder: Secondary | ICD-10-CM | POA: Diagnosis not present

## 2021-05-15 DIAGNOSIS — I2782 Chronic pulmonary embolism: Secondary | ICD-10-CM | POA: Diagnosis not present

## 2021-05-15 DIAGNOSIS — I350 Nonrheumatic aortic (valve) stenosis: Secondary | ICD-10-CM | POA: Diagnosis not present

## 2021-05-15 DIAGNOSIS — E039 Hypothyroidism, unspecified: Secondary | ICD-10-CM | POA: Diagnosis not present

## 2021-07-03 DIAGNOSIS — L603 Nail dystrophy: Secondary | ICD-10-CM | POA: Diagnosis not present

## 2021-07-03 DIAGNOSIS — I7091 Generalized atherosclerosis: Secondary | ICD-10-CM | POA: Diagnosis not present

## 2021-07-03 DIAGNOSIS — I252 Old myocardial infarction: Secondary | ICD-10-CM | POA: Diagnosis not present

## 2021-07-03 DIAGNOSIS — B351 Tinea unguium: Secondary | ICD-10-CM | POA: Diagnosis not present

## 2021-07-03 DIAGNOSIS — Z8673 Personal history of transient ischemic attack (TIA), and cerebral infarction without residual deficits: Secondary | ICD-10-CM | POA: Diagnosis not present

## 2021-07-03 DIAGNOSIS — M6281 Muscle weakness (generalized): Secondary | ICD-10-CM | POA: Diagnosis not present

## 2021-07-03 DIAGNOSIS — M542 Cervicalgia: Secondary | ICD-10-CM | POA: Diagnosis not present

## 2021-07-03 DIAGNOSIS — R2681 Unsteadiness on feet: Secondary | ICD-10-CM | POA: Diagnosis not present

## 2021-07-03 DIAGNOSIS — F0280 Dementia in other diseases classified elsewhere without behavioral disturbance: Secondary | ICD-10-CM | POA: Diagnosis not present

## 2021-07-03 DIAGNOSIS — I6529 Occlusion and stenosis of unspecified carotid artery: Secondary | ICD-10-CM | POA: Diagnosis not present

## 2021-07-03 DIAGNOSIS — R278 Other lack of coordination: Secondary | ICD-10-CM | POA: Diagnosis not present

## 2021-07-15 DIAGNOSIS — N390 Urinary tract infection, site not specified: Secondary | ICD-10-CM | POA: Diagnosis not present

## 2021-07-15 DIAGNOSIS — M159 Polyosteoarthritis, unspecified: Secondary | ICD-10-CM | POA: Diagnosis not present

## 2021-07-15 DIAGNOSIS — F39 Unspecified mood [affective] disorder: Secondary | ICD-10-CM | POA: Diagnosis not present

## 2021-07-15 DIAGNOSIS — I2699 Other pulmonary embolism without acute cor pulmonale: Secondary | ICD-10-CM | POA: Diagnosis not present

## 2021-07-15 DIAGNOSIS — I739 Peripheral vascular disease, unspecified: Secondary | ICD-10-CM | POA: Diagnosis not present

## 2021-07-15 DIAGNOSIS — F0150 Vascular dementia without behavioral disturbance: Secondary | ICD-10-CM | POA: Diagnosis not present

## 2021-08-30 DIAGNOSIS — J029 Acute pharyngitis, unspecified: Secondary | ICD-10-CM | POA: Diagnosis not present

## 2021-09-04 DIAGNOSIS — M6281 Muscle weakness (generalized): Secondary | ICD-10-CM | POA: Diagnosis not present

## 2021-09-04 DIAGNOSIS — G309 Alzheimer's disease, unspecified: Secondary | ICD-10-CM | POA: Diagnosis not present

## 2021-09-05 DIAGNOSIS — B351 Tinea unguium: Secondary | ICD-10-CM | POA: Diagnosis not present

## 2021-09-05 DIAGNOSIS — I7091 Generalized atherosclerosis: Secondary | ICD-10-CM | POA: Diagnosis not present

## 2021-09-05 DIAGNOSIS — M25775 Osteophyte, left foot: Secondary | ICD-10-CM | POA: Diagnosis not present

## 2021-09-05 DIAGNOSIS — M71572 Other bursitis, not elsewhere classified, left ankle and foot: Secondary | ICD-10-CM | POA: Diagnosis not present

## 2021-09-08 DIAGNOSIS — G309 Alzheimer's disease, unspecified: Secondary | ICD-10-CM | POA: Diagnosis not present

## 2021-09-08 DIAGNOSIS — M6281 Muscle weakness (generalized): Secondary | ICD-10-CM | POA: Diagnosis not present

## 2021-09-11 DIAGNOSIS — I2782 Chronic pulmonary embolism: Secondary | ICD-10-CM | POA: Diagnosis not present

## 2021-09-11 DIAGNOSIS — N930 Postcoital and contact bleeding: Secondary | ICD-10-CM | POA: Diagnosis not present

## 2021-09-11 DIAGNOSIS — I10 Essential (primary) hypertension: Secondary | ICD-10-CM | POA: Diagnosis not present

## 2021-09-11 DIAGNOSIS — F0150 Vascular dementia without behavioral disturbance: Secondary | ICD-10-CM | POA: Diagnosis not present

## 2021-09-11 DIAGNOSIS — F39 Unspecified mood [affective] disorder: Secondary | ICD-10-CM | POA: Diagnosis not present

## 2021-09-11 DIAGNOSIS — I739 Peripheral vascular disease, unspecified: Secondary | ICD-10-CM | POA: Diagnosis not present

## 2021-09-11 DIAGNOSIS — M1990 Unspecified osteoarthritis, unspecified site: Secondary | ICD-10-CM | POA: Diagnosis not present

## 2021-09-11 DIAGNOSIS — E039 Hypothyroidism, unspecified: Secondary | ICD-10-CM | POA: Diagnosis not present

## 2021-09-12 DIAGNOSIS — G309 Alzheimer's disease, unspecified: Secondary | ICD-10-CM | POA: Diagnosis not present

## 2021-09-12 DIAGNOSIS — M6281 Muscle weakness (generalized): Secondary | ICD-10-CM | POA: Diagnosis not present

## 2021-09-14 DIAGNOSIS — G309 Alzheimer's disease, unspecified: Secondary | ICD-10-CM | POA: Diagnosis not present

## 2021-09-14 DIAGNOSIS — M6281 Muscle weakness (generalized): Secondary | ICD-10-CM | POA: Diagnosis not present

## 2021-09-19 DIAGNOSIS — G309 Alzheimer's disease, unspecified: Secondary | ICD-10-CM | POA: Diagnosis not present

## 2021-09-19 DIAGNOSIS — M6281 Muscle weakness (generalized): Secondary | ICD-10-CM | POA: Diagnosis not present

## 2021-09-21 DIAGNOSIS — G309 Alzheimer's disease, unspecified: Secondary | ICD-10-CM | POA: Diagnosis not present

## 2021-09-21 DIAGNOSIS — M6281 Muscle weakness (generalized): Secondary | ICD-10-CM | POA: Diagnosis not present

## 2021-09-26 DIAGNOSIS — M6281 Muscle weakness (generalized): Secondary | ICD-10-CM | POA: Diagnosis not present

## 2021-09-26 DIAGNOSIS — G309 Alzheimer's disease, unspecified: Secondary | ICD-10-CM | POA: Diagnosis not present

## 2021-09-28 DIAGNOSIS — G309 Alzheimer's disease, unspecified: Secondary | ICD-10-CM | POA: Diagnosis not present

## 2021-09-28 DIAGNOSIS — M6281 Muscle weakness (generalized): Secondary | ICD-10-CM | POA: Diagnosis not present

## 2021-10-05 DIAGNOSIS — G309 Alzheimer's disease, unspecified: Secondary | ICD-10-CM | POA: Diagnosis not present

## 2021-10-05 DIAGNOSIS — M6281 Muscle weakness (generalized): Secondary | ICD-10-CM | POA: Diagnosis not present

## 2021-10-08 DIAGNOSIS — M6281 Muscle weakness (generalized): Secondary | ICD-10-CM | POA: Diagnosis not present

## 2021-10-08 DIAGNOSIS — G309 Alzheimer's disease, unspecified: Secondary | ICD-10-CM | POA: Diagnosis not present

## 2021-10-10 DIAGNOSIS — G309 Alzheimer's disease, unspecified: Secondary | ICD-10-CM | POA: Diagnosis not present

## 2021-10-10 DIAGNOSIS — M6281 Muscle weakness (generalized): Secondary | ICD-10-CM | POA: Diagnosis not present

## 2021-10-13 DIAGNOSIS — G309 Alzheimer's disease, unspecified: Secondary | ICD-10-CM | POA: Diagnosis not present

## 2021-10-13 DIAGNOSIS — M6281 Muscle weakness (generalized): Secondary | ICD-10-CM | POA: Diagnosis not present

## 2021-10-17 DIAGNOSIS — G309 Alzheimer's disease, unspecified: Secondary | ICD-10-CM | POA: Diagnosis not present

## 2021-10-17 DIAGNOSIS — M6281 Muscle weakness (generalized): Secondary | ICD-10-CM | POA: Diagnosis not present

## 2021-10-19 DIAGNOSIS — G309 Alzheimer's disease, unspecified: Secondary | ICD-10-CM | POA: Diagnosis not present

## 2021-10-19 DIAGNOSIS — M6281 Muscle weakness (generalized): Secondary | ICD-10-CM | POA: Diagnosis not present

## 2021-10-24 DIAGNOSIS — G309 Alzheimer's disease, unspecified: Secondary | ICD-10-CM | POA: Diagnosis not present

## 2021-10-24 DIAGNOSIS — M6281 Muscle weakness (generalized): Secondary | ICD-10-CM | POA: Diagnosis not present

## 2021-10-26 DIAGNOSIS — G309 Alzheimer's disease, unspecified: Secondary | ICD-10-CM | POA: Diagnosis not present

## 2021-10-26 DIAGNOSIS — M6281 Muscle weakness (generalized): Secondary | ICD-10-CM | POA: Diagnosis not present

## 2021-10-31 DIAGNOSIS — M6281 Muscle weakness (generalized): Secondary | ICD-10-CM | POA: Diagnosis not present

## 2021-10-31 DIAGNOSIS — G309 Alzheimer's disease, unspecified: Secondary | ICD-10-CM | POA: Diagnosis not present

## 2021-11-19 ENCOUNTER — Non-Acute Institutional Stay (SKILLED_NURSING_FACILITY): Payer: Medicare Other | Admitting: Nurse Practitioner

## 2021-11-19 ENCOUNTER — Encounter: Payer: Self-pay | Admitting: Nurse Practitioner

## 2021-11-19 DIAGNOSIS — N390 Urinary tract infection, site not specified: Secondary | ICD-10-CM | POA: Diagnosis not present

## 2021-11-19 DIAGNOSIS — E039 Hypothyroidism, unspecified: Secondary | ICD-10-CM | POA: Diagnosis not present

## 2021-11-19 DIAGNOSIS — I2699 Other pulmonary embolism without acute cor pulmonale: Secondary | ICD-10-CM | POA: Diagnosis not present

## 2021-11-19 DIAGNOSIS — E559 Vitamin D deficiency, unspecified: Secondary | ICD-10-CM | POA: Diagnosis not present

## 2021-11-19 DIAGNOSIS — K5904 Chronic idiopathic constipation: Secondary | ICD-10-CM | POA: Diagnosis not present

## 2021-11-19 DIAGNOSIS — M1990 Unspecified osteoarthritis, unspecified site: Secondary | ICD-10-CM

## 2021-11-19 DIAGNOSIS — I10 Essential (primary) hypertension: Secondary | ICD-10-CM

## 2021-11-19 NOTE — Progress Notes (Signed)
Location:   [place] [facility] Room Number: 001 A Place of Service:  SNF [phone]) Provider:  Sherrie Mustache, NP  Dewayne Shorter, MD  Patient Care Team: Dewayne Shorter, MD as PCP - General [facility])  Extended Emergency Contact Information Primary Emergency Contact: [facility] Address: [address]          [place], [place] [address] [name] of [place] Phone: [phone] Mobile Phone: [phone] Relation: Daughter Secondary Emergency Contact: Tawni Levy Address: [address]          [place], [place] [address] [name] of [place] Phone: [phone] Mobile Phone: [phone] Relation: Daughter  Code Status:  DNR Goals of care: Advanced Directive information    11/19/2021    3:03 PM  Advanced Directives  Does Patient Have a Medical Advance Directive? Yes  Type of Advance Directive Out of facility DNR (pink MOST or yellow form)  Does patient want to make changes to medical advance directive? No - Patient declined  Pre-existing out of facility DNR order (yellow form or pink MOST form) Yellow form placed in chart (order not valid for inpatient use)     Chief Complaint  Patient presents with   Medical Management of Chronic Issues    Routine follow up   Immunizations    COVID vaccine,tetanus/tdap, shingrix vaccine, pneumonia vaccine, flu vaccine   Quality Metric Gaps    Foot exam, eye exam, medicare annual wellness, hemoglobin A1C    HPI:  Pt is a 88 y.o. female seen today for medical management of chronic diseases.  Pt with hx of dementia, PAD, mood disorder, recurrent UTI , OA at [facility] for [facility] care. She is pleasantly confused without complaints today.  Staff without concerns.  She  needs assistance with ADLs, continues to feed herself.  In wheelchair    Past Medical History:  Diagnosis Date   Arthritis    Bleeding disorder ([place])    Chronic cystitis    Collagen vascular disease ([place])     CVA (cerebral infarction) 2003   Dysuria    Gross hematuria    Hematuria    Hemihypertrophy    History of kidney stones    Hyperlipidemia    Hypertension    Murmur, cardiac    Myocardial infarction [facility])    Peripheral vascular disease (HCC)    s/p stent right leg   Pulmonary nodule    stable on Chest CT March 2014, Followed at [facility]   Recurrent UTI    Sensory urge incontinence    Thyroid disease    Urinary frequency    Past Surgical History:  Procedure Laterality Date   ABDOMINAL HYSTERECTOMY  1982   APPENDECTOMY     CARDIAC SURGERY     2 failed stents in right leg   [place]  2007   replaced ball in right hip   left rotator cuff     repair   ruptured disc     SPINE SURGERY     steel rod in back   stent placement leg Right 2015   TONSILLECTOMY     TOTAL HIP ARTHROPLASTY     right   TOTAL KNEE ARTHROPLASTY Left     Allergies  Allergen Reactions   Sulfa Antibiotics Hives and Itching   Ciprofloxacin Hives and Itching   Penicillins Hives and Itching    .Has patient had a PCN reaction causing immediate rash, facial/tongue/throat swelling, SOB or lightheadedness with  hypotension: Unknown Has patient had a PCN reaction causing severe rash involving mucus membranes or skin necrosis: Unknown Has patient had a PCN reaction that required hospitalization: Unknown Has patient had a PCN reaction occurring within the last 10 years: Unknown If all of the above answers are "NO", then may proceed with Cephalosporin use.     Allergies as of 11/19/2021       Reactions   Sulfa Antibiotics Hives, Itching   Ciprofloxacin Hives, Itching   Penicillins Hives, Itching   .Has patient had a PCN reaction causing immediate rash, facial/tongue/throat swelling, SOB or lightheadedness with hypotension: Unknown Has patient had a PCN reaction causing severe rash involving mucus membranes or skin necrosis: Unknown Has patient had a PCN reaction that required  hospitalization: Unknown Has patient had a PCN reaction occurring within the last 10 years: Unknown If all of the above answers are "NO", then may proceed with Cephalosporin use.        Medication List        Accurate as of November 19, 2021  3:22 PM. If you have any questions, ask your nurse or doctor.          STOP taking these medications    amLODipine 5 MG tablet Commonly known as: NORVASC Stopped by: Lauree Chandler, NP   bisacodyl 10 MG suppository Commonly known as: DULCOLAX Stopped by: Lauree Chandler, NP   Cranberry 425 MG Caps Stopped by: Lauree Chandler, NP   hydrOXYzine 25 MG tablet Commonly known as: ATARAX Stopped by: Lauree Chandler, NP   levofloxacin 500 MG tablet Commonly known as: Levaquin Stopped by: Lauree Chandler, NP   Lidocaine 2 % Gel Stopped by: Lauree Chandler, NP   magnesium hydroxide 400 MG/5ML suspension Commonly known as: MILK OF MAGNESIA Stopped by: Lauree Chandler, NP   menthol-cetylpyridinium 3 MG lozenge Commonly known as: CEPACOL Stopped by: Lauree Chandler, NP   metoprolol succinate 25 MG 24 hr tablet Commonly known as: TOPROL-XL Stopped by: Lauree Chandler, NP   nitrofurantoin (macrocrystal-monohydrate) 100 MG capsule Commonly known as: MACROBID Stopped by: Lauree Chandler, NP   omeprazole 20 MG capsule Commonly known as: PRILOSEC Stopped by: Lauree Chandler, NP   polyethylene glycol 17 g packet Commonly known as: MIRALAX / GLYCOLAX Stopped by: Lauree Chandler, NP   pravastatin 40 MG tablet Commonly known as: PRAVACHOL Stopped by: Lauree Chandler, NP   sodium chloride 0.65 % Soln nasal spray Commonly known as: OCEAN Stopped by: Lauree Chandler, NP   sucralfate 1 GM/10ML suspension Commonly known as: Carafate Stopped by: Lauree Chandler, NP   vitamin C 1000 MG tablet Stopped by: Lauree Chandler, NP       TAKE these medications    Align 4 MG Caps Take 1 capsule by  mouth daily.   apixaban 2.5 MG Tabs tablet Commonly known as: ELIQUIS Take 2.5 mg by mouth 2 (two) times daily. What changed: Another medication with the same name was removed. Continue taking this medication, and follow the directions you see here. Changed by: Lauree Chandler, NP   ARTHRITIS PAIN PO Take 650 mg by mouth 3 (three) times daily.   cetirizine 10 MG tablet Commonly known as: ZYRTEC Take 10 mg by mouth daily.   cyanocobalamin 500 MCG tablet Commonly known as: VITAMIN B12 Take 500 mcg by mouth daily. What changed: Another medication with the same name was removed. Continue taking this medication, and follow the  directions you see here. Changed by: Lauree Chandler, NP   dextromethorphan-guaiFENesin 10-100 MG/5ML liquid Commonly known as: ROBITUSSIN-DM Take 10 mLs by mouth every 4 (four) hours as needed for cough.   estradiol 1 MG tablet Commonly known as: ESTRACE Take by mouth. 1/2 tablet Tuesday, Friday at bedtime   hydrocortisone cream 1 % Apply 1 Application topically 2 (two) times daily as needed for itching.   levothyroxine 25 MCG tablet Commonly known as: SYNTHROID TAKE 1 TABLET (25 MCG TOTAL) BY MOUTH DAILY.   memantine 10 MG tablet Commonly known as: NAMENDA Take 10 mg by mouth every 12 (twelve) hours as needed.   nystatin powder Generic drug: nystatin Apply 1 Application topically 2 (two) times daily as needed.   phenazopyridine 100 MG tablet Commonly known as: PYRIDIUM Take 100 mg by mouth 2 (two) times daily as needed for pain.   senna 8.6 MG Tabs tablet Commonly known as: SENOKOT Take 2 tablets by mouth daily.   Vitamin D3 1.25 MG (50000 UT) Caps Take 50,000 Units by mouth every 30 (thirty) days. Patient takes on the 13th        Review of Systems  Constitutional:  Negative for activity change, appetite change, fatigue and unexpected weight change.  HENT:  Negative for congestion and hearing loss.   Eyes: Negative.   Respiratory:   Negative for cough and shortness of breath.   Cardiovascular:  Negative for chest pain, palpitations and leg swelling.  Gastrointestinal:  Negative for abdominal pain, constipation and diarrhea.  Genitourinary:  Negative for difficulty urinating and dysuria.  Musculoskeletal:  Negative for arthralgias and myalgias.  Skin:  Negative for color change and wound.  Neurological:  Negative for dizziness and weakness.  Psychiatric/Behavioral:  Positive for confusion. Negative for agitation and behavioral problems.     Immunization History  Administered Date(s) Administered   Influenza Split 10/26/2010, 01/26/2012, 09/20/2013   Influenza-Unspecified 11/30/2012, 11/05/2021   Moderna Sars-Covid-2 Vaccination 02/28/2019, 03/28/2019   Pneumococcal Polysaccharide-23 12/01/2007   Unspecified SARS-COV-2 Vaccination 12/02/2019, 06/08/2020, 10/12/2020   Pertinent  Health Maintenance Due  Topic Date Due   FOOT EXAM  Never done   OPHTHALMOLOGY EXAM  Never done   HEMOGLOBIN A1C  11/12/2016   INFLUENZA VACCINE  Completed   DEXA SCAN  Completed      06/01/2013   11:10 AM 06/01/2013   11:12 AM 06/01/2013   11:13 AM 06/20/2014    1:06 PM 08/20/2018    5:21 PM  [place] in the past year? Yes Yes Yes Yes   Number of falls in past year - Comments     Emmi Telephone Survey Actual Response =   Was there an injury with Fall?    Yes   Patient at Risk for Falls Due to Impaired balance/gait Impaired balance/gait Impaired balance/gait    Fall risk Follow up    Education provided    Functional Status Survey:    Vitals:   11/19/21 1453  BP: 133/72  Pulse: 74  Resp: 16  Temp: (!) 97.2 F (36.2 C)  SpO2: 95%  Weight: 135 lb 6.4 oz (61.4 kg)  Height: '5\' 5"'$  (1.651 m)   Body mass index is 22.53 kg/m. Physical Exam Constitutional:      General: She is not in acute distress.    Appearance: She is well-developed. She is not diaphoretic.  HENT:     Head: Normocephalic and atraumatic.      Mouth/Throat:     Pharynx: No oropharyngeal  exudate.  Eyes:     Conjunctiva/sclera: Conjunctivae normal.     Pupils: Pupils are equal, round, and reactive to light.  Cardiovascular:     Rate and Rhythm: Normal rate and regular rhythm.     Heart sounds: Normal heart sounds.  Pulmonary:     Effort: Pulmonary effort is normal.     Breath sounds: Normal breath sounds.  Abdominal:     General: Bowel sounds are normal.     Palpations: Abdomen is soft.  Musculoskeletal:     Cervical back: Normal range of motion and neck supple.     Right lower leg: No edema.     Left lower leg: No edema.  Skin:    General: Skin is warm and dry.  Neurological:     Mental Status: She is alert. Mental status is at baseline.     Motor: Weakness present.     Gait: Gait abnormal (wheelchair).  Psychiatric:        Mood and Affect: Mood normal.    Labs reviewed: No results for input(s): "NA", "K", "CL", "CO2", "GLUCOSE", "BUN", "CREATININE", "CALCIUM", "MG", "PHOS" in the last 8760 hours. No results for input(s): "AST", "ALT", "ALKPHOS", "BILITOT", "PROT", "ALBUMIN" in the last 8760 hours. No results for input(s): "WBC", "NEUTROABS", "HGB", "HCT", "MCV", "PLT" in the last 8760 hours. Lab Results  Component Value Date   TSH 2.748 05/13/2016   Lab Results  Component Value Date   HGBA1C 5.3 05/13/2016   Lab Results  Component Value Date   CHOL 165 08/11/2014   HDL 43.30 08/11/2014   LDLCALC 98 08/11/2014   TRIG 120.0 08/11/2014   CHOLHDL 4 08/11/2014    Significant Diagnostic Results in last 30 days:  No results found.  Assessment/Plan 1. Primary hypertension Blood pressure well controlled, goal bp <140/90 Continue current medications and dietary modifications follow metabolic panel  2. Hypothyroidism, unspecified type -follow up TSH.  Continues synthroid   3. Osteoarthritis, unspecified osteoarthritis type, unspecified site -stable on tylenol scheduled.   4. Recurrent UTI No recent  UTI, on estrace tablet, will dc at this time.and monitor.   5. Chronic idiopathic constipation -controlled on senna   6. Vitamin D deficiency -on monthly supplement.   7. Recurrent pulmonary emboli (HCC) -continues on eliquis BID.     Carlos American. [place], [facility] [phone]

## 2021-11-20 DIAGNOSIS — E890 Postprocedural hypothyroidism: Secondary | ICD-10-CM | POA: Diagnosis not present

## 2021-11-20 DIAGNOSIS — I10 Essential (primary) hypertension: Secondary | ICD-10-CM | POA: Diagnosis not present

## 2021-11-20 DIAGNOSIS — E785 Hyperlipidemia, unspecified: Secondary | ICD-10-CM | POA: Diagnosis not present

## 2021-11-20 LAB — CBC AND DIFFERENTIAL
HCT: 41 (ref 36–46)
Hemoglobin: 13.7 (ref 12.0–16.0)
Platelets: 244 10*3/uL (ref 150–400)
WBC: 4

## 2021-11-20 LAB — CBC: RBC: 4.28 (ref 3.87–5.11)

## 2021-11-20 LAB — TSH: TSH: 2.67 (ref 0.41–5.90)

## 2021-11-21 DIAGNOSIS — E785 Hyperlipidemia, unspecified: Secondary | ICD-10-CM | POA: Diagnosis not present

## 2021-11-21 DIAGNOSIS — I10 Essential (primary) hypertension: Secondary | ICD-10-CM | POA: Diagnosis not present

## 2021-11-21 DIAGNOSIS — D510 Vitamin B12 deficiency anemia due to intrinsic factor deficiency: Secondary | ICD-10-CM | POA: Diagnosis not present

## 2021-11-21 DIAGNOSIS — E039 Hypothyroidism, unspecified: Secondary | ICD-10-CM | POA: Diagnosis not present

## 2021-11-21 LAB — BASIC METABOLIC PANEL
BUN: 18 (ref 4–21)
CO2: 29 — AB (ref 13–22)
Chloride: 105 (ref 99–108)
Creatinine: 0.8 (ref 0.5–1.1)
Glucose: 81
Potassium: 4.7 mEq/L (ref 3.5–5.1)
Sodium: 140 (ref 137–147)

## 2021-11-21 LAB — LIPID PANEL
Cholesterol: 176 (ref 0–200)
HDL: 37 (ref 35–70)
LDL Cholesterol: 114
LDl/HDL Ratio: 4.8
Triglycerides: 142 (ref 40–160)

## 2021-11-21 LAB — HEPATIC FUNCTION PANEL
ALT: 6 U/L — AB (ref 7–35)
AST: 12 — AB (ref 13–35)
Alkaline Phosphatase: 41 (ref 25–125)
Bilirubin, Total: 0.4

## 2021-11-21 LAB — COMPREHENSIVE METABOLIC PANEL
Albumin: 3.5 (ref 3.5–5.0)
Calcium: 9.7 (ref 8.7–10.7)
Globulin: 2.5
eGFR: 75

## 2021-11-21 LAB — VITAMIN B12: Vitamin B-12: 823

## 2021-12-20 ENCOUNTER — Non-Acute Institutional Stay (SKILLED_NURSING_FACILITY): Payer: Medicare Other | Admitting: Nurse Practitioner

## 2021-12-20 ENCOUNTER — Encounter: Payer: Self-pay | Admitting: Nurse Practitioner

## 2021-12-20 DIAGNOSIS — R0981 Nasal congestion: Secondary | ICD-10-CM | POA: Diagnosis not present

## 2021-12-20 NOTE — Progress Notes (Signed)
Location:  [facility]) Nursing Home Room Number: 161-W Place of Service:   SNF Provider:  Micah Flesher, MD  Patient Care Team: Dewayne Shorter, MD as PCP - General [facility])  Extended Emergency Contact Information Primary Emergency Contact: [facility] Address: [address]          [place], [place] [address] [name] of [place] Phone: [phone] Mobile Phone: [phone] Relation: Daughter Secondary Emergency Contact: Tawni Levy Address: [address]          [place], [place] [address] [name] of [place] Phone: [phone] Mobile Phone: [phone] Relation: Daughter  Code Status:  DNR Goals of care: Advanced Directive information    12/20/2021    2:20 PM  Advanced Directives  Does Patient Have a Medical Advance Directive? Yes  Type of Advance Directive Out of facility DNR (pink MOST or yellow form)  Does patient want to make changes to medical advance directive? No - Patient declined  Pre-existing out of facility DNR order (yellow form or pink MOST form) Yellow form placed in chart (order not valid for inpatient use)     Chief Complaint  Patient presents with   Acute Visit    Not feeling well    HPI:  Pt is a 88 y.o. female seen today for an acute visit per family reports patient not feeling well with increase in congestion.  Pt with advanced dementia and unable to provided history. Nursing has not noticed any changes in behavior, appetite, LOC or congestion.  Pt is alert and talkative at time of visit but oriented to self only and has no complaints.    Past Medical History:  Diagnosis Date   Arthritis    Bleeding disorder ([place])    Chronic cystitis    Collagen vascular disease ([place])    CVA (cerebral infarction) 2003   Dysuria    Gross hematuria    Hematuria    Hemihypertrophy    History of kidney stones    Hyperlipidemia    Hypertension    Murmur, cardiac    Myocardial infarction [facility])     Peripheral vascular disease (HCC)    s/p stent right leg   Pulmonary nodule    stable on Chest CT March 2014, Followed at [facility]   Recurrent UTI    Sensory urge incontinence    Thyroid disease    Urinary frequency    Past Surgical History:  Procedure Laterality Date   ABDOMINAL HYSTERECTOMY  1982   APPENDECTOMY     CARDIAC SURGERY     2 failed stents in right leg   [place]  2007   replaced ball in right hip   left rotator cuff     repair   ruptured disc     SPINE SURGERY     steel rod in back   stent placement leg Right 2015   TONSILLECTOMY     TOTAL HIP ARTHROPLASTY     right   TOTAL KNEE ARTHROPLASTY Left     Allergies  Allergen Reactions   Sulfa Antibiotics Hives and Itching   Ciprofloxacin Hives and Itching   Penicillins Hives and Itching    .Has patient had a PCN reaction causing immediate rash, facial/tongue/throat swelling, SOB or lightheadedness with hypotension: Unknown Has patient had a PCN reaction causing severe rash involving mucus membranes or skin necrosis: Unknown Has patient had a PCN reaction that required hospitalization: Unknown Has patient had a PCN reaction occurring  within the last 10 years: Unknown If all of the above answers are "NO", then may proceed with Cephalosporin use.     Outpatient Encounter Medications as of 12/20/2021  Medication Sig   Acetaminophen (ARTHRITIS PAIN PO) Take 650 mg by mouth 3 (three) times daily.   apixaban (ELIQUIS) 2.5 MG TABS tablet Take 2.5 mg by mouth 2 (two) times daily.   cetirizine (ZYRTEC) 10 MG tablet Take 10 mg by mouth daily.   Cholecalciferol (VITAMIN D3) 50000 units CAPS Take 50,000 Units by mouth every 30 (thirty) days. Patient takes on the 13th   cyanocobalamin (VITAMIN B12) 500 MCG tablet Take 500 mcg by mouth daily.   dextromethorphan-guaiFENesin (ROBITUSSIN-DM) 10-100 MG/5ML liquid Take 10 mLs by mouth every 4 (four) hours as needed for cough.   hydrocortisone cream 1 %  Apply 1 Application topically 2 (two) times daily as needed for itching.   levothyroxine (SYNTHROID, LEVOTHROID) 25 MCG tablet TAKE 1 TABLET (25 MCG TOTAL) BY MOUTH DAILY.   memantine (NAMENDA) 10 MG tablet Take 10 mg by mouth every 12 (twelve) hours as needed.   nystatin powder Apply 1 Application topically 2 (two) times daily as needed.   phenazopyridine (PYRIDIUM) 100 MG tablet Take 100 mg by mouth 2 (two) times daily as needed for pain.   senna (SENOKOT) 8.6 MG TABS tablet Take 2 tablets by mouth daily.   estradiol (ESTRACE) 1 MG tablet Take by mouth. 1/2 tablet Tuesday, Friday at bedtime (Patient not taking: Reported on 12/20/2021)   Probiotic Product (ALIGN) 4 MG CAPS Take 1 capsule by mouth daily. (Patient not taking: Reported on 12/20/2021)   No facility-administered encounter medications on file as of 12/20/2021.    Review of Systems  Unable to perform ROS: Dementia    Immunization History  Administered Date(s) Administered   Influenza Split 10/26/2010, 01/26/2012, 09/20/2013   Influenza-Unspecified 11/30/2012, 11/05/2021   Moderna Sars-Covid-2 Vaccination 02/28/2019, 03/28/2019   Pneumococcal Polysaccharide-23 12/01/2007   Unspecified SARS-COV-2 Vaccination 12/02/2019, 06/08/2020, 10/12/2020   Pertinent  Health Maintenance Due  Topic Date Due   FOOT EXAM  Never done   OPHTHALMOLOGY EXAM  Never done   HEMOGLOBIN A1C  11/12/2016   INFLUENZA VACCINE  Completed   DEXA SCAN  Completed      06/01/2013   11:10 AM 06/01/2013   11:12 AM 06/01/2013   11:13 AM 06/20/2014    1:06 PM 08/20/2018    5:21 PM  [place] in the past year? Yes Yes Yes Yes   Number of falls in past year - Comments     Emmi Telephone Survey Actual Response =   Was there an injury with Fall?    Yes   Patient at Risk for Falls Due to Impaired balance/gait Impaired balance/gait Impaired balance/gait    Fall risk Follow up    Education provided    Functional Status Survey:    Vitals:   12/20/21 1401   BP: (!) 104/57  Pulse: 70  Resp: 16  Temp: (!) 97.3 F (36.3 C)  SpO2: 94%  Weight: 133 lb 12.8 oz (60.7 kg)  Height: '5\' 5"'$  (1.651 m)   Body mass index is 22.27 kg/m. Physical Exam Constitutional:      General: She is not in acute distress.    Appearance: She is well-developed. She is not diaphoretic.  HENT:     Head: Normocephalic and atraumatic.     Mouth/Throat:     Pharynx: No oropharyngeal exudate.  Eyes:  Conjunctiva/sclera: Conjunctivae normal.     Pupils: Pupils are equal, round, and reactive to light.  Cardiovascular:     Rate and Rhythm: Normal rate and regular rhythm.     Heart sounds: Normal heart sounds.  Pulmonary:     Effort: Pulmonary effort is normal.     Breath sounds: Normal breath sounds.  Abdominal:     General: Bowel sounds are normal.     Palpations: Abdomen is soft.  Musculoskeletal:     Cervical back: Normal range of motion and neck supple.     Right lower leg: No edema.     Left lower leg: No edema.  Skin:    General: Skin is warm and dry.  Neurological:     Mental Status: She is alert. Mental status is at baseline.     Gait: Gait abnormal (wheelchair bound).  Psychiatric:        Mood and Affect: Mood normal.     Labs reviewed: Recent Labs    11/21/21 0000  NA 140  K 4.7  CL 105  CO2 29*  BUN 18  CREATININE 0.8  CALCIUM 9.7   Recent Labs    11/21/21 0000  AST 12*  ALT 6*  ALKPHOS 41  ALBUMIN 3.5   Recent Labs    11/20/21 0000  WBC 4.0  HGB 13.7  HCT 41  PLT 244   Lab Results  Component Value Date   TSH 2.67 11/20/2021   Lab Results  Component Value Date   HGBA1C 5.3 05/13/2016   Lab Results  Component Value Date   CHOL 176 11/21/2021   HDL 37 11/21/2021   LDLCALC 114 11/21/2021   TRIG 142 11/21/2021   CHOLHDL 4 08/11/2014    Significant Diagnostic Results in last 30 days:  No results found.  Assessment/Plan 1. Nose congestion No congestion noted today -will have staff monitor VS q 6 or 72  hours and notify for any progressive symptoms.  -continue supportive care.   Carlos American. [place], [facility] [phone]

## 2021-12-25 ENCOUNTER — Non-Acute Institutional Stay (SKILLED_NURSING_FACILITY): Payer: Medicare Other | Admitting: Student

## 2021-12-25 ENCOUNTER — Encounter: Payer: Self-pay | Admitting: Student

## 2021-12-25 DIAGNOSIS — R059 Cough, unspecified: Secondary | ICD-10-CM | POA: Diagnosis not present

## 2021-12-25 DIAGNOSIS — E440 Moderate protein-calorie malnutrition: Secondary | ICD-10-CM | POA: Diagnosis not present

## 2021-12-25 DIAGNOSIS — R1314 Dysphagia, pharyngoesophageal phase: Secondary | ICD-10-CM | POA: Diagnosis not present

## 2021-12-25 DIAGNOSIS — F0280 Dementia in other diseases classified elsewhere without behavioral disturbance: Secondary | ICD-10-CM

## 2021-12-25 DIAGNOSIS — J690 Pneumonitis due to inhalation of food and vomit: Secondary | ICD-10-CM | POA: Diagnosis not present

## 2021-12-25 DIAGNOSIS — G309 Alzheimer's disease, unspecified: Secondary | ICD-10-CM

## 2021-12-25 DIAGNOSIS — R4182 Altered mental status, unspecified: Secondary | ICD-10-CM | POA: Diagnosis not present

## 2021-12-25 DIAGNOSIS — R531 Weakness: Secondary | ICD-10-CM

## 2021-12-25 DIAGNOSIS — R253 Fasciculation: Secondary | ICD-10-CM | POA: Diagnosis not present

## 2021-12-25 NOTE — Progress Notes (Signed)
Location:  Other [facility].  Nursing Home Room Number: [place] 411A Place of Service:  SNF (31) Provider:  Dr. Amada Kingfisher, MD  Patient Care Team: Dewayne Shorter, MD as PCP - General [facility])  Extended Emergency Contact Information Primary Emergency Contact: [facility] Address: [address]          [place], [place] [address] [name] of [place] Phone: [phone] Mobile Phone: [phone] Relation: Daughter Secondary Emergency Contact: Tawni Levy Address: [address]          [place], [place] [address] [name] of [place] Phone: [phone] Mobile Phone: [phone] Relation: Daughter  Code Status:  DNR Goals of care: Advanced Directive information    12/25/2021   11:32 AM  Advanced Directives  Does Patient Have a Medical Advance Directive? Yes  Type of Advance Directive Out of facility DNR (pink MOST or yellow form)  Does patient want to make changes to medical advance directive? No - Patient declined     Chief Complaint  Patient presents with   Acute Visit    Decrease in Oral Intake.     HPI:  Pt is a 88 y.o. female seen today for an acute visit for concern of decreased PO intake and "not acting herself" since Thursday. Spoke with her daughter and 1/3 [place].   Patient is oriented to self. She does hit and swing despite stating what is happening (nursing states this is her typical behavior). Nurse is at bedside attempting assisted feeding.   Patient has been a resident for 7 years. She started to need the hoyer lift for transfers about 1 month ago. On Thursday patient only ate bites of a grilled cheese sandwich in small bites.   Patient had a remote history of recurrent UTIs and previously seen by urology. Unclear if patient currently has an infection since she cannot provide a history.   Her daughter confirms that her mom has a DNR order, however, does not feel comfortable with changing her status to [facility]  without a conversation with her two sisters. Discussed concern that patient's with dementia develop pneumonia or UTIs which are a sign of decline and are not preventable because patient's dictate their PO intake. Discussed concern this could be an acute infection or sign of progression of dementia and there are times when we no longer recommend treatment of infections and the goal would be comfort to allow her to pass naturally. Patient's daughter would like to continue conversations when her sisters are available or present.   Nursing states second sister arrived and has concern for pneumonia because she has been coughing.    Past Medical History:  Diagnosis Date   Arthritis    Bleeding disorder ([employer])    Chronic cystitis    Collagen vascular disease ([place])    CVA (cerebral infarction) 2003   Dysuria    Gross hematuria    Hematuria    Hemihypertrophy    History of kidney stones    Hyperlipidemia    Hypertension    Murmur, cardiac    Myocardial infarction [facility])    Peripheral vascular disease (HCC)    s/p stent right leg   Pulmonary nodule    stable on Chest CT March 2014, Followed at [facility]   Recurrent UTI    Sensory urge incontinence    Thyroid disease    Urinary frequency    Past Surgical History:  Procedure Laterality Date   ABDOMINAL HYSTERECTOMY  1982   APPENDECTOMY  CARDIAC SURGERY     2 failed stents in right leg   CARPAL TUNNEL RELEASE     HIP SURGERY  2007   replaced ball in right hip   left rotator cuff     repair   ruptured disc     SPINE SURGERY     steel rod in back   stent placement leg Right 2015   TONSILLECTOMY     TOTAL HIP ARTHROPLASTY     right   TOTAL KNEE ARTHROPLASTY Left     Allergies  Allergen Reactions   Levaquin [Levofloxacin]    Sulfa Antibiotics Hives and Itching   Ciprofloxacin Hives and Itching   Doxycycline Hives and Itching   Ferrous Sulfate Hives and Itching   Penicillins Hives and Itching    .Has patient had a PCN reaction  causing immediate rash, facial/tongue/throat swelling, SOB or lightheadedness with hypotension: Unknown Has patient had a PCN reaction causing severe rash involving mucus membranes or skin necrosis: Unknown Has patient had a PCN reaction that required hospitalization: Unknown Has patient had a PCN reaction occurring within the last 10 years: Unknown If all of the above answers are "NO", then may proceed with Cephalosporin use.     Outpatient Encounter Medications as of 12/25/2021  Medication Sig   Acetaminophen (ARTHRITIS PAIN PO) Take 650 mg by mouth 3 (three) times daily.   apixaban (ELIQUIS) 2.5 MG TABS tablet Take 2.5 mg by mouth 2 (two) times daily.   cetirizine (ZYRTEC) 10 MG tablet Take 10 mg by mouth daily.   Cholecalciferol (VITAMIN D3) 50000 units CAPS Take 50,000 Units by mouth every 30 (thirty) days. Patient takes on the 13th   cyanocobalamin (VITAMIN B12) 500 MCG tablet Take 500 mcg by mouth daily.   dextromethorphan-guaiFENesin (ROBITUSSIN-DM) 10-100 MG/5ML liquid Take 10 mLs by mouth every 4 (four) hours as needed for cough.   hydrocortisone cream 1 % Apply 1 Application topically 2 (two) times daily as needed for itching.   levothyroxine (SYNTHROID, LEVOTHROID) 25 MCG tablet TAKE 1 TABLET (25 MCG TOTAL) BY MOUTH DAILY.   memantine (NAMENDA) 10 MG tablet Take 10 mg by mouth every 12 (twelve) hours as needed.   nystatin powder Apply 1 Application topically 2 (two) times daily as needed.   phenazopyridine (PYRIDIUM) 100 MG tablet Take 100 mg by mouth 2 (two) times daily as needed for pain.   senna (SENOKOT) 8.6 MG TABS tablet Take 2 tablets by mouth daily.   [DISCONTINUED] estradiol (ESTRACE) 1 MG tablet Take by mouth. 1/2 tablet Tuesday, Friday at bedtime (Patient not taking: Reported on 12/20/2021)   [DISCONTINUED] Probiotic Product (ALIGN) 4 MG CAPS Take 1 capsule by mouth daily. (Patient not taking: Reported on 12/20/2021)   No facility-administered encounter medications on  file as of 12/25/2021.    Review of Systems  Unable to perform ROS: Dementia    Immunization History  Administered Date(s) Administered   Influenza Split 10/26/2010, 01/26/2012, 09/20/2013   Influenza-Unspecified 11/30/2012, 11/05/2021   Moderna Sars-Covid-2 Vaccination 02/28/2019, 03/28/2019   Pneumococcal Polysaccharide-23 12/01/2007   Unspecified SARS-COV-2 Vaccination 12/02/2019, 06/08/2020, 10/12/2020   Pertinent  Health Maintenance Due  Topic Date Due   FOOT EXAM  Never done   OPHTHALMOLOGY EXAM  Never done   HEMOGLOBIN A1C  11/12/2016   INFLUENZA VACCINE  Completed   DEXA SCAN  Completed      06/01/2013   11:10 AM 06/01/2013   11:12 AM 06/01/2013   11:13 AM 06/20/2014    1:06 PM  08/20/2018    5:21 PM  Fall Risk  Falls in the past year? Yes Yes Yes Yes   Number of falls in past year - Comments     Emmi Telephone Survey Actual Response =   Was there an injury with Fall?    Yes   Patient at Risk for Falls Due to Impaired balance/gait Impaired balance/gait Impaired balance/gait    Fall risk Follow up    Education provided    Functional Status Survey:    Vitals:   12/25/21 1125  BP: 127/77  Pulse: 80  Resp: 18  Temp: (!) 97.4 F (36.3 C)  SpO2: 96%  Weight: 133 lb 12.8 oz (60.7 kg)  Height: '5\' 5"'$  (1.651 m)   Body mass index is 22.27 kg/m. Physical Exam  Labs reviewed: Recent Labs    11/21/21 0000  NA 140  K 4.7  CL 105  CO2 29*  BUN 18  CREATININE 0.8  CALCIUM 9.7   Recent Labs    11/21/21 0000  AST 12*  ALT 6*  ALKPHOS 41  ALBUMIN 3.5   Recent Labs    11/20/21 0000  WBC 4.0  HGB 13.7  HCT 41  PLT 244   Lab Results  Component Value Date   TSH 2.67 11/20/2021   Lab Results  Component Value Date   HGBA1C 5.3 05/13/2016   Lab Results  Component Value Date   CHOL 176 11/21/2021   HDL 37 11/21/2021   LDLCALC 114 11/21/2021   TRIG 142 11/21/2021   CHOLHDL 4 08/11/2014    Significant Diagnostic Results in last 30 days:  No  results found.  Assessment/Plan Dysphagia, pharyngoesophageal phase  Aspiration pneumonia of both lower lobes due to gastric secretions (HCC)  Malnutrition of moderate degree (HCC)  Generalized weakness  Alzheimer's disease ([place]) Patient with long history of alzheimer's with noted need for caregiver support for the last 7 years. Patient with long history of urinary tract infections, however, she has had progression of weakness for the last month and nursing has not been able to put her on the toilet to collect a sample. Discussed possibility of I&O cath, but given patient's level of combativeness, family agreed to defer collection. Plan to collect CBC, BMP, and CXR today to evaluate for cause of acute change. Discussed concern that this could be a reflection of her overall decline given her decline in function and weight loss over the last few months. Plan to treat an infection at this time, however, will need additional goals of care conversations in the future. At this time, patient is still able to be hospitalized, however, would like to discuss with family prior to such a transition of care. Patient does have a DNR in place. Will plan to discuss a MOST form with the family in a family meeting as soon as nursing helps with coordination.      Family/ staff Communication: Nurse, clinical coordinator, Blima Dessert.  Labs/tests ordered:  CBC, BMP, CXR  Tomasa Rand, MD, [facility] [phone]

## 2021-12-26 ENCOUNTER — Other Ambulatory Visit: Payer: Self-pay

## 2021-12-26 ENCOUNTER — Emergency Department: Payer: Medicare Other

## 2021-12-26 ENCOUNTER — Inpatient Hospital Stay
Admission: EM | Admit: 2021-12-26 | Discharge: 2021-12-30 | DRG: 871 | Disposition: A | Discharge status: Skilled Nursing Facility | Payer: Medicare Other | Source: Skilled Nursing Facility | Attending: Student | Admitting: Student

## 2021-12-26 ENCOUNTER — Encounter: Payer: Self-pay | Admitting: Emergency Medicine

## 2021-12-26 ENCOUNTER — Inpatient Hospital Stay: Payer: Medicare Other

## 2021-12-26 DIAGNOSIS — Z8673 Personal history of transient ischemic attack (TIA), and cerebral infarction without residual deficits: Secondary | ICD-10-CM | POA: Diagnosis not present

## 2021-12-26 DIAGNOSIS — B9620 Unspecified Escherichia coli [E. coli] as the cause of diseases classified elsewhere: Secondary | ICD-10-CM | POA: Diagnosis present

## 2021-12-26 DIAGNOSIS — Z8249 Family history of ischemic heart disease and other diseases of the circulatory system: Secondary | ICD-10-CM

## 2021-12-26 DIAGNOSIS — F0280 Dementia in other diseases classified elsewhere without behavioral disturbance: Secondary | ICD-10-CM | POA: Diagnosis present

## 2021-12-26 DIAGNOSIS — F0150 Vascular dementia without behavioral disturbance: Secondary | ICD-10-CM | POA: Diagnosis present

## 2021-12-26 DIAGNOSIS — Z515 Encounter for palliative care: Secondary | ICD-10-CM

## 2021-12-26 DIAGNOSIS — Z86711 Personal history of pulmonary embolism: Secondary | ICD-10-CM

## 2021-12-26 DIAGNOSIS — R6520 Severe sepsis without septic shock: Secondary | ICD-10-CM | POA: Diagnosis not present

## 2021-12-26 DIAGNOSIS — Z7901 Long term (current) use of anticoagulants: Secondary | ICD-10-CM

## 2021-12-26 DIAGNOSIS — J690 Pneumonitis due to inhalation of food and vomit: Secondary | ICD-10-CM | POA: Diagnosis not present

## 2021-12-26 DIAGNOSIS — G4700 Insomnia, unspecified: Secondary | ICD-10-CM | POA: Diagnosis present

## 2021-12-26 DIAGNOSIS — E785 Hyperlipidemia, unspecified: Secondary | ICD-10-CM | POA: Diagnosis present

## 2021-12-26 DIAGNOSIS — B964 Proteus (mirabilis) (morganii) as the cause of diseases classified elsewhere: Secondary | ICD-10-CM | POA: Diagnosis present

## 2021-12-26 DIAGNOSIS — I2510 Atherosclerotic heart disease of native coronary artery without angina pectoris: Secondary | ICD-10-CM | POA: Diagnosis present

## 2021-12-26 DIAGNOSIS — G309 Alzheimer's disease, unspecified: Secondary | ICD-10-CM | POA: Diagnosis not present

## 2021-12-26 DIAGNOSIS — E559 Vitamin D deficiency, unspecified: Secondary | ICD-10-CM | POA: Diagnosis not present

## 2021-12-26 DIAGNOSIS — E440 Moderate protein-calorie malnutrition: Secondary | ICD-10-CM | POA: Diagnosis present

## 2021-12-26 DIAGNOSIS — Z96652 Presence of left artificial knee joint: Secondary | ICD-10-CM | POA: Diagnosis present

## 2021-12-26 DIAGNOSIS — M1990 Unspecified osteoarthritis, unspecified site: Secondary | ICD-10-CM | POA: Diagnosis not present

## 2021-12-26 DIAGNOSIS — R531 Weakness: Secondary | ICD-10-CM | POA: Diagnosis not present

## 2021-12-26 DIAGNOSIS — Z66 Do not resuscitate: Secondary | ICD-10-CM | POA: Diagnosis present

## 2021-12-26 DIAGNOSIS — Z743 Need for continuous supervision: Secondary | ICD-10-CM | POA: Diagnosis not present

## 2021-12-26 DIAGNOSIS — R0602 Shortness of breath: Secondary | ICD-10-CM | POA: Diagnosis not present

## 2021-12-26 DIAGNOSIS — I10 Essential (primary) hypertension: Secondary | ICD-10-CM | POA: Diagnosis not present

## 2021-12-26 DIAGNOSIS — E1151 Type 2 diabetes mellitus with diabetic peripheral angiopathy without gangrene: Secondary | ICD-10-CM | POA: Diagnosis present

## 2021-12-26 DIAGNOSIS — I739 Peripheral vascular disease, unspecified: Secondary | ICD-10-CM | POA: Diagnosis not present

## 2021-12-26 DIAGNOSIS — Z882 Allergy status to sulfonamides status: Secondary | ICD-10-CM

## 2021-12-26 DIAGNOSIS — E039 Hypothyroidism, unspecified: Secondary | ICD-10-CM | POA: Diagnosis present

## 2021-12-26 DIAGNOSIS — Z1152 Encounter for screening for COVID-19: Secondary | ICD-10-CM | POA: Diagnosis not present

## 2021-12-26 DIAGNOSIS — Z6822 Body mass index (BMI) 22.0-22.9, adult: Secondary | ICD-10-CM

## 2021-12-26 DIAGNOSIS — N390 Urinary tract infection, site not specified: Secondary | ICD-10-CM

## 2021-12-26 DIAGNOSIS — R54 Age-related physical debility: Secondary | ICD-10-CM | POA: Diagnosis present

## 2021-12-26 DIAGNOSIS — Z79890 Hormone replacement therapy (postmenopausal): Secondary | ICD-10-CM

## 2021-12-26 DIAGNOSIS — R918 Other nonspecific abnormal finding of lung field: Secondary | ICD-10-CM | POA: Diagnosis not present

## 2021-12-26 DIAGNOSIS — Z96641 Presence of right artificial hip joint: Secondary | ICD-10-CM | POA: Diagnosis present

## 2021-12-26 DIAGNOSIS — J9621 Acute and chronic respiratory failure with hypoxia: Secondary | ICD-10-CM | POA: Diagnosis present

## 2021-12-26 DIAGNOSIS — N3000 Acute cystitis without hematuria: Secondary | ICD-10-CM | POA: Diagnosis present

## 2021-12-26 DIAGNOSIS — R0689 Other abnormalities of breathing: Secondary | ICD-10-CM | POA: Diagnosis not present

## 2021-12-26 DIAGNOSIS — Z880 Allergy status to penicillin: Secondary | ICD-10-CM

## 2021-12-26 DIAGNOSIS — K5730 Diverticulosis of large intestine without perforation or abscess without bleeding: Secondary | ICD-10-CM | POA: Diagnosis not present

## 2021-12-26 DIAGNOSIS — R509 Fever, unspecified: Secondary | ICD-10-CM | POA: Diagnosis not present

## 2021-12-26 DIAGNOSIS — Z79899 Other long term (current) drug therapy: Secondary | ICD-10-CM

## 2021-12-26 DIAGNOSIS — Z888 Allergy status to other drugs, medicaments and biological substances status: Secondary | ICD-10-CM

## 2021-12-26 DIAGNOSIS — R0902 Hypoxemia: Secondary | ICD-10-CM | POA: Diagnosis not present

## 2021-12-26 DIAGNOSIS — I252 Old myocardial infarction: Secondary | ICD-10-CM

## 2021-12-26 DIAGNOSIS — R1314 Dysphagia, pharyngoesophageal phase: Secondary | ICD-10-CM | POA: Diagnosis present

## 2021-12-26 DIAGNOSIS — A419 Sepsis, unspecified organism: Secondary | ICD-10-CM | POA: Diagnosis not present

## 2021-12-26 DIAGNOSIS — R69 Illness, unspecified: Secondary | ICD-10-CM | POA: Diagnosis not present

## 2021-12-26 DIAGNOSIS — I959 Hypotension, unspecified: Secondary | ICD-10-CM | POA: Diagnosis not present

## 2021-12-26 DIAGNOSIS — E079 Disorder of thyroid, unspecified: Secondary | ICD-10-CM | POA: Diagnosis present

## 2021-12-26 DIAGNOSIS — J189 Pneumonia, unspecified organism: Secondary | ICD-10-CM | POA: Diagnosis present

## 2021-12-26 DIAGNOSIS — Z7189 Other specified counseling: Secondary | ICD-10-CM | POA: Diagnosis not present

## 2021-12-26 LAB — CBC WITH DIFFERENTIAL/PLATELET
Abs Immature Granulocytes: 0.13 10*3/uL — ABNORMAL HIGH (ref 0.00–0.07)
Basophils Absolute: 0 10*3/uL (ref 0.0–0.1)
Basophils Relative: 0 %
Eosinophils Absolute: 0.1 10*3/uL (ref 0.0–0.5)
Eosinophils Relative: 0 %
HCT: 39.9 % (ref 36.0–46.0)
Hemoglobin: 12.6 g/dL (ref 12.0–15.0)
Immature Granulocytes: 1 %
Lymphocytes Relative: 10 %
Lymphs Abs: 2 10*3/uL (ref 0.7–4.0)
MCH: 30.5 pg (ref 26.0–34.0)
MCHC: 31.6 g/dL (ref 30.0–36.0)
MCV: 96.6 fL (ref 80.0–100.0)
Monocytes Absolute: 1.5 10*3/uL — ABNORMAL HIGH (ref 0.1–1.0)
Monocytes Relative: 8 %
Neutro Abs: 16.2 10*3/uL — ABNORMAL HIGH (ref 1.7–7.7)
Neutrophils Relative %: 81 %
Platelets: 343 10*3/uL (ref 150–400)
RBC: 4.13 MIL/uL (ref 3.87–5.11)
RDW: 11.9 % (ref 11.5–15.5)
WBC: 19.9 10*3/uL — ABNORMAL HIGH (ref 4.0–10.5)
nRBC: 0 % (ref 0.0–0.2)

## 2021-12-26 LAB — MRSA NEXT GEN BY PCR, NASAL: MRSA by PCR Next Gen: NOT DETECTED

## 2021-12-26 LAB — PROCALCITONIN: Procalcitonin: 0.19 ng/mL

## 2021-12-26 LAB — URINALYSIS, ROUTINE W REFLEX MICROSCOPIC
Bilirubin Urine: NEGATIVE
Glucose, UA: NEGATIVE mg/dL
Ketones, ur: 5 mg/dL — AB
Nitrite: NEGATIVE
Protein, ur: 30 mg/dL — AB
Specific Gravity, Urine: 1.014 (ref 1.005–1.030)
WBC, UA: >50 WBC/hpf — ABNORMAL HIGH (ref 0–5)
pH: 6 (ref 5.0–8.0)

## 2021-12-26 LAB — RESP PANEL BY RT-PCR (FLU A&B, COVID) ARPGX2
Influenza A by PCR: NEGATIVE
Influenza B by PCR: NEGATIVE
SARS Coronavirus 2 by RT PCR: NEGATIVE

## 2021-12-26 LAB — COMPREHENSIVE METABOLIC PANEL
ALT: 10 U/L (ref 0–44)
AST: 19 U/L (ref 15–41)
Albumin: 3.2 g/dL — ABNORMAL LOW (ref 3.5–5.0)
Alkaline Phosphatase: 54 U/L (ref 38–126)
Anion gap: 8 (ref 5–15)
BUN: 13 mg/dL (ref 8–23)
CO2: 26 mmol/L (ref 22–32)
Calcium: 9.7 mg/dL (ref 8.9–10.3)
Chloride: 101 mmol/L (ref 98–111)
Creatinine, Ser: 0.84 mg/dL (ref 0.44–1.00)
GFR, Estimated: >60 mL/min (ref >60)
Glucose, Bld: 127 mg/dL — ABNORMAL HIGH (ref 70–99)
Potassium: 4.3 mmol/L (ref 3.5–5.1)
Sodium: 135 mmol/L (ref 135–145)
Total Bilirubin: 0.7 mg/dL (ref 0.3–1.2)
Total Protein: 6.9 g/dL (ref 6.5–8.1)

## 2021-12-26 LAB — PROTIME-INR
INR: 1.3 — ABNORMAL HIGH (ref 0.8–1.2)
Prothrombin Time: 16.4 seconds — ABNORMAL HIGH (ref 11.4–15.2)

## 2021-12-26 LAB — LACTIC ACID, PLASMA: Lactic Acid, Venous: 1.2 mmol/L (ref 0.5–1.9)

## 2021-12-26 LAB — TROPONIN I (HIGH SENSITIVITY): Troponin I (High Sensitivity): 25 ng/L — ABNORMAL HIGH (ref <18)

## 2021-12-26 MED ORDER — LACTATED RINGERS IV BOLUS (SEPSIS)
1000.0000 mL | Freq: Once | INTRAVENOUS | Status: AC
  Administered 2021-12-26: 1000 mL via INTRAVENOUS

## 2021-12-26 MED ORDER — SENNA 8.6 MG PO TABS: 2.0000 | ORAL_TABLET | Freq: Every day | ORAL | Status: DC | PRN

## 2021-12-26 MED ORDER — ACETAMINOPHEN 160 MG/5ML PO SOLN
650.0000 mg | Freq: Once | ORAL | Status: AC
  Administered 2021-12-26: 650 mg via ORAL
  Filled 2021-12-26: qty 20.3

## 2021-12-26 MED ORDER — CYANOCOBALAMIN 500 MCG PO TABS
500.0000 ug | ORAL_TABLET | Freq: Every day | ORAL | Status: DC
  Administered 2021-12-28 – 2021-12-30 (×3): 500 ug via ORAL
  Filled 2021-12-26 (×4): qty 1

## 2021-12-26 MED ORDER — ACETAMINOPHEN 325 MG PO TABS
650.0000 mg | ORAL_TABLET | Freq: Four times a day (QID) | ORAL | Status: DC | PRN
  Administered 2021-12-27 – 2021-12-28 (×2): 650 mg via ORAL
  Filled 2021-12-26 (×2): qty 2

## 2021-12-26 MED ORDER — VANCOMYCIN HCL 1500 MG/300ML IV SOLN
1500.0000 mg | Freq: Once | INTRAVENOUS | Status: AC
  Administered 2021-12-26: 1500 mg via INTRAVENOUS
  Filled 2021-12-26: qty 300

## 2021-12-26 MED ORDER — ADULT MULTIVITAMIN W/MINERALS CH
1.0000 | ORAL_TABLET | Freq: Every day | ORAL | Status: DC
  Administered 2021-12-26 – 2021-12-30 (×4): 1 via ORAL
  Filled 2021-12-26 (×5): qty 1

## 2021-12-26 MED ORDER — VANCOMYCIN HCL IN DEXTROSE 1-5 GM/200ML-% IV SOLN: 1000.0000 mg | Freq: Once | INTRAVENOUS | Status: DC

## 2021-12-26 MED ORDER — LACTATED RINGERS IV SOLN: INTRAVENOUS | Status: DC

## 2021-12-26 MED ORDER — ENSURE ENLIVE PO LIQD
237.0000 mL | Freq: Three times a day (TID) | ORAL | Status: DC
  Administered 2021-12-27 – 2021-12-30 (×10): 237 mL via ORAL

## 2021-12-26 MED ORDER — ONDANSETRON HCL 4 MG PO TABS: 4.0000 mg | ORAL_TABLET | Freq: Four times a day (QID) | ORAL | Status: DC | PRN

## 2021-12-26 MED ORDER — MEMANTINE HCL 5 MG PO TABS
10.0000 mg | ORAL_TABLET | Freq: Two times a day (BID) | ORAL | Status: DC
  Administered 2021-12-26 – 2021-12-30 (×8): 10 mg via ORAL
  Filled 2021-12-26 (×9): qty 2

## 2021-12-26 MED ORDER — APIXABAN 2.5 MG PO TABS
2.5000 mg | ORAL_TABLET | Freq: Two times a day (BID) | ORAL | Status: DC
  Administered 2021-12-26 – 2021-12-30 (×8): 2.5 mg via ORAL
  Filled 2021-12-26 (×9): qty 1

## 2021-12-26 MED ORDER — VITAMIN D3 1.25 MG (50000 UT) PO CAPS: 50000.0000 [IU] | ORAL_CAPSULE | ORAL | Status: DC

## 2021-12-26 MED ORDER — ONDANSETRON HCL 4 MG/2ML IJ SOLN: 4.0000 mg | Freq: Four times a day (QID) | INTRAMUSCULAR | Status: DC | PRN

## 2021-12-26 MED ORDER — ACETAMINOPHEN 650 MG RE SUPP: 650.0000 mg | Freq: Four times a day (QID) | RECTAL | Status: DC | PRN

## 2021-12-26 MED ORDER — GUAIFENESIN-DM 100-10 MG/5ML PO SYRP: 10.0000 mL | ORAL_SOLUTION | ORAL | Status: DC | PRN

## 2021-12-26 MED ORDER — PIPERACILLIN-TAZOBACTAM 3.375 G IVPB
3.3750 g | Freq: Three times a day (TID) | INTRAVENOUS | Status: AC
  Administered 2021-12-26 – 2021-12-29 (×10): 3.375 g via INTRAVENOUS
  Filled 2021-12-26 (×10): qty 50

## 2021-12-26 MED ORDER — ACETAMINOPHEN 650 MG RE SUPP: 650.0000 mg | Freq: Once | RECTAL | Status: DC

## 2021-12-26 MED ORDER — METRONIDAZOLE 500 MG/100ML IV SOLN
500.0000 mg | Freq: Once | INTRAVENOUS | Status: AC
  Administered 2021-12-26: 500 mg via INTRAVENOUS
  Filled 2021-12-26: qty 100

## 2021-12-26 MED ORDER — PHENAZOPYRIDINE HCL 100 MG PO TABS
100.0000 mg | ORAL_TABLET | Freq: Three times a day (TID) | ORAL | Status: DC
  Administered 2021-12-26 – 2021-12-27 (×4): 100 mg via ORAL
  Filled 2021-12-26 (×8): qty 1

## 2021-12-26 MED ORDER — SODIUM CHLORIDE 0.9 % IV SOLN
2.0000 g | Freq: Once | INTRAVENOUS | Status: AC
  Administered 2021-12-26: 2 g via INTRAVENOUS
  Filled 2021-12-26: qty 12.5

## 2021-12-26 MED ORDER — LEVOTHYROXINE SODIUM 25 MCG PO TABS
25.0000 ug | ORAL_TABLET | Freq: Every day | ORAL | Status: DC
  Administered 2021-12-27 – 2021-12-30 (×4): 25 ug via ORAL
  Filled 2021-12-26 (×4): qty 1

## 2021-12-26 MED ORDER — IOHEXOL 300 MG/ML  SOLN
100.0000 mL | Freq: Once | INTRAMUSCULAR | Status: AC | PRN
  Administered 2021-12-26: 100 mL via INTRAVENOUS

## 2021-12-26 NOTE — Assessment & Plan Note (Signed)
Related to known dementia and poor oral intake Will consult dietitian

## 2021-12-26 NOTE — Assessment & Plan Note (Signed)
Continue Synthroid °

## 2021-12-26 NOTE — H&P (Addendum)
History and Physical    Patient: Krista Ellis:[id number] DOB: 08/02/1930 DOA: 12/26/2021 DOS: the patient was seen and examined on 12/26/2021 PCP: Dewayne Shorter, MD  Patient coming from: SNF  Chief Complaint:  Chief Complaint  Patient presents with   Shortness of Breath    Most of the history is obtained from the ER notes and patient's daughters at the bedside HPI: Krista Ellis is a 88 y.o. female with medical history significant for CVA, history of peripheral vascular disease, history of pulmonary embolism, hypothyroidism, hypertension, vascular dementia, chronic respiratory failure on 3 L of oxygen as needed who resides at [facility] and was brought into the ER for evaluation of fever and hypoxia.  Patient has had poor oral intake over the last several days. Chart review shows that patient was seen by her primary care provider and there was a concern for possible aspiration pneumonia related to patient's dysphagia.  Patient also noted to have moderate malnutrition. Daughter stated she has had several choking episodes with meals and while I was in the room they were trying to administer some Tylenol and patient kept coughing.  Daughter also states that she has complained of some lower abdominal pain. Patient noted to be combative during my evaluation and refusing physical exam. Unable to do review of systems due to her underlying dementia She had a Tmax of 101.2 upon arrival to the ER, was said to be hypotensive and responded to IV fluid resuscitation.  She was hypoxic with room air pulse oximetry in the low 80s and is currently on her 3 L of oxygen by nasal cannula. Labs show a white count of 19,000 and pyuria Patient received a dose of cefepime, vancomycin and Flagyl. She will be admitted to the hospital for further evaluation.   Review of Systems: As mentioned in the history of present illness. All other systems reviewed and are negative. Past Medical History:   Diagnosis Date   Arthritis    Bleeding disorder ([place])    Chronic cystitis    Collagen vascular disease ([place])    CVA (cerebral infarction) 2003   Dysuria    Gross hematuria    Hematuria    Hemihypertrophy    History of kidney stones    Hyperlipidemia    Hypertension    Murmur, cardiac    Myocardial infarction [facility])    Peripheral vascular disease (HCC)    s/p stent right leg   Pulmonary nodule    stable on Chest CT March 2014, Followed at [facility]   Recurrent UTI    Sensory urge incontinence    Thyroid disease    Urinary frequency    Past Surgical History:  Procedure Laterality Date   ABDOMINAL HYSTERECTOMY  1982   APPENDECTOMY     CARDIAC SURGERY     2 failed stents in right leg   [place]  2007   replaced ball in right hip   left rotator cuff     repair   ruptured disc     SPINE SURGERY     steel rod in back   stent placement leg Right 2015   TONSILLECTOMY     TOTAL HIP ARTHROPLASTY     right   TOTAL KNEE ARTHROPLASTY Left    Social History:  reports that she has never smoked. She has never used smokeless tobacco. She reports that she does not drink alcohol and does not use drugs.  Allergies  Allergen Reactions  Levaquin [Levofloxacin]    Sulfa Antibiotics Hives and Itching   Ciprofloxacin Hives and Itching   Doxycycline Hives and Itching   Ferrous Sulfate Hives and Itching   Penicillins Hives and Itching    .Has patient had a PCN reaction causing immediate rash, facial/tongue/throat swelling, SOB or lightheadedness with hypotension: Unknown Has patient had a PCN reaction causing severe rash involving mucus membranes or skin necrosis: Unknown Has patient had a PCN reaction that required hospitalization: Unknown Has patient had a PCN reaction occurring within the last 10 years: Unknown If all of the above answers are "NO", then may proceed with Cephalosporin use.     Family History  Problem Relation Age of Onset   Hypertension  Mother    Transient ischemic attack Mother    Kidney disease Neg Hx    Bladder Cancer Neg Hx    Prostate cancer Neg Hx     Prior to Admission medications   Medication Sig Start Date End Date Taking? Authorizing Provider  Acetaminophen (ARTHRITIS PAIN PO) Take 650 mg by mouth 3 (three) times daily.    [provider]  apixaban (ELIQUIS) 2.5 MG TABS tablet Take 2.5 mg by mouth 2 (two) times daily.    [provider]  cetirizine (ZYRTEC) 10 MG tablet Take 10 mg by mouth daily.    [provider]  Cholecalciferol (VITAMIN D3) 50000 units CAPS Take 50,000 Units by mouth every 30 (thirty) days. Patient takes on the 13th    [provider]  cyanocobalamin (VITAMIN B12) 500 MCG tablet Take 500 mcg by mouth daily.    [provider]  dextromethorphan-guaiFENesin (ROBITUSSIN-DM) 10-100 MG/5ML liquid Take 10 mLs by mouth every 4 (four) hours as needed for cough.    [provider]  hydrocortisone cream 1 % Apply 1 Application topically 2 (two) times daily as needed for itching.    [provider]  levothyroxine (SYNTHROID, LEVOTHROID) 25 MCG tablet TAKE 1 TABLET (25 MCG TOTAL) BY MOUTH DAILY. 07/04/14   Jackolyn Confer, MD  memantine (NAMENDA) 10 MG tablet Take 10 mg by mouth every 12 (twelve) hours as needed.    [provider]  nystatin powder Apply 1 Application topically 2 (two) times daily as needed.    [provider]  phenazopyridine (PYRIDIUM) 100 MG tablet Take 100 mg by mouth 2 (two) times daily as needed for pain.    [provider]  senna (SENOKOT) 8.6 MG TABS tablet Take 2 tablets by mouth daily.    [provider]    Physical Exam: Vitals:   12/26/21 0508 12/26/21 0522 12/26/21 0900  BP:  (!) 116/43 (!) 120/55  Pulse:  82 80  Resp:  20 20  Temp:  (!) 101.2 F (38.4 C) (!) 100.7 F (38.2 C)  TempSrc:  Rectal Rectal  SpO2:  97% 98%  Weight: 60.7 kg    Height: _0  (1.651 m)      Physical Exam Vitals and nursing note reviewed.  Constitutional:      Comments: Thin and frail  HENT:     Head: Normocephalic and atraumatic.     Mouth/Throat:     Mouth: Mucous membranes are moist.  Cardiovascular:     Rate and Rhythm: Normal rate and regular rhythm.  Pulmonary:     Effort: Pulmonary effort is normal.     Breath sounds: Examination of the right-lower field reveals rhonchi. Examination of the left-lower field reveals rhonchi. Rhonchi present.  Abdominal:  General: Bowel sounds are normal.     Palpations: Abdomen is soft.     Tenderness: There is abdominal tenderness.     Comments: Suprapubic tenderness  Musculoskeletal:     Cervical back: Normal range of motion and neck supple.     Comments: Bilateral heel protectors  Skin:    Findings: Ecchymosis present.     Comments: Ecchymosis right forearm  Neurological:     Mental Status: She is alert.     Comments: Oriented to person, not to place or time Able to move all extremities  Psychiatric:        Behavior: Behavior is agitated.     Data Reviewed: Relevant notes from primary care and specialist visits, past discharge summaries as available in EHR, including Care Everywhere. Prior diagnostic testing as pertinent to current admission diagnoses Updated medications and problem lists for reconciliation ED course, including vitals, labs, imaging, treatment and response to treatment Triage notes, nursing and pharmacy notes and ED provider's notes Notable results as noted in HPI Labs reviewed.  Troponin 25, procalcitonin 0.19, lactic acid 1.2, sodium 135, potassium 4.3, chloride 101, bicarb 26, glucose 127, BUN 13, creatinine 0.84, calcium 9.4, total protein 6.9, albumin 3.2, AST, ALT 10, alk phos 54, total bilirubin 0.7, PT 16.4, INR 1.3, white count 19.9, hemoglobin 12.6, hematocrit 39.9, platelet count 343 Respiratory viral panel is negative CT scan of abdomen and pelvis shows mild right basilar opacity is  noted concerning for atelectasis or infiltrate. Multiple nodular opacities are noted in the visualized portion of the lung bases, right greater than left, concerning for metastatic disease or possibly multifocal inflammation. CT scan of the chest is recommended for further evaluation.Sigmoid diverticulosis without inflammation. Aortic Atherosclerosis CT chest without contrast shows multifocal nodules and peribronchovascular consolidations, involving all lobes, though most pronounced in the right lower lobe. Findings are most consistent with multifocal pneumonia. Recommend follow-up chest CT in 3 months after treatment to ensure resolution. Subcarinal lymphadenopathy, likely reactive. Cholelithiasis. Coronary artery calcifications. Aortic valvular and mitral annular calcifications. Aortic Atherosclerosis Chest x ray reviewed by me shows no evidence of acute cardio pulmonary disease 12 Lead EKG reviewed by me shows normal sinus rhythm  There are no new results to review at this time.  Assessment and Plan: * Sepsis secondary to UTI ([place]) As evidenced by fever, hypotension that responded to IV fluid resuscitation, acute respiratory failure with hypoxia as evidenced by room air pulse oximetry in the low 80s, marked leukocytosis, pyuria and imaging showing multifocal pneumonia. Continue IV fluid resuscitation Continue oxygen supplementation to maintain pulse oximetry greater than 92% Treat patient empirically with Zosyn adjusted to renal function Follow-up results of blood cultures  Aspiration pneumonia [facility]) Patient presents to the ER for evaluation of poor oral intake and a fever as well as hypoxia with room air pulse oximetry in the low 80s.  Patient is currently on 3 L of oxygen with pulse oximetry greater than 92% She has marked leukocytosis of 19,000 Imaging shows multifocal nodules and peribronchovascular consolidations, involving all lobes, though most pronounced in the right lower lobe.  Findings are most consistent with multifocal pneumonia. Patient has dysphagia and recurrent choking episodes which family states has become more frequent and is at high risk for aspiration Place patient on aspiration precautions Start patient on Zosyn adjusted to renal function  Malnutrition of moderate degree ([place]) Related to known dementia and poor oral intake Will consult dietitian  Hypothyroidism Continue Synthroid  Hypertension Hold all antihypertensive medications for now  Dysphagia, pharyngoesophageal phase-resolved as of 06/01/2013 Patient has a known history of dysphagia, pharyngeal esophageal phase with frequent choking episodes resulting in aspiration. Request speech therapy evaluation      Advance Care Planning:   Code Status: DNR   Consults: Palliative care, dietitian, speech therapy  Family Communication: Greater than 50% of time was spent discussing patient's condition and plan of care with her daughters at the bedside.  All questions and concerns have been addressed.  They verbalized understanding and agreed with the plan.  CODE STATUS was discussed and patient has a universal DNR  Severity of Illness: The appropriate patient status for this patient is INPATIENT. Inpatient status is judged to be reasonable and necessary in order to provide the required intensity of service to ensure the patient's safety. The patient's presenting symptoms, physical exam findings, and initial radiographic and laboratory data in the context of their chronic comorbidities is felt to place them at high risk for further clinical deterioration. Furthermore, it is not anticipated that the patient will be medically stable for discharge from the hospital within 2 midnights of admission.   * I certify that at the point of admission it is my clinical judgment that the patient will require inpatient hospital care spanning beyond 2 midnights from the point of admission due to high intensity of  service, high risk for further deterioration and high frequency of surveillance required.*  Author: Collier Bullock, MD 12/26/2021 10:17 AM  For on call review [email or web address].

## 2021-12-26 NOTE — ED Provider Notes (Signed)
[facility] Provider Note    Event Date/Time   First MD Initiated Contact with Patient 12/26/21 [phone]     (approximate)   History   Chief Complaint Shortness of Breath   HPI  Krista Ellis is a 88 y.o. female with past medical history of hypertension, diabetes, CAD, PE on Eliquis, hypothyroidism, seizures, and Alzheimer's dementia who presents to the ED complaining of shortness of breath.  History is limited due to patient's baseline dementia and majority of history obtained from EMS.  They state that staff at patient's nursing facility noted she was hypoxic from the low 80s to mid 90s on room air, typically wears 3 L nasal cannula as needed.  She was also found to have a fever of 100.6 and EMS reported increased weakness with poor p.o. intake over the past 2 to 3 days.  Patient has been reportedly combative at times, appears to be at baseline mental status per daughters at bedside.  Patient currently denies any complaints.     Physical Exam   Triage Vital Signs: ED Triage Vitals [12/26/21 0508]  Enc Vitals Group     BP      Pulse      Resp      Temp      Temp src      SpO2      Weight 133 lb 12.8 oz (60.7 kg)     Height '5\' 5"'$  (1.651 m)     Head Circumference      Peak Flow      Pain Score      Pain Loc      Pain Edu?      Excl. in [place]?     Most recent vital signs: Vitals:   12/26/21 0522  BP: (!) 116/43  Pulse: 82  Resp: 20  Temp: (!) 101.2 F (38.4 C)  SpO2: 97%    Constitutional: Somnolent but arousable to voice, oriented to person but not place, time, or situation. Eyes: Conjunctivae are normal. Head: Atraumatic. Nose: No congestion/rhinnorhea. Mouth/Throat: Mucous membranes are moist.  Cardiovascular: Normal rate, regular rhythm. Grossly normal heart sounds.  2+ radial pulses bilaterally. Respiratory: Mildly tachypneic with increased respiratory effort.  No retractions. Lungs CTAB. Gastrointestinal: Soft and diffusely tender  to palpation with no rebound or guarding. No distention. Musculoskeletal: No lower extremity tenderness nor edema.  Neurologic:  Normal speech and language. No gross focal neurologic deficits are appreciated.    ED Results / Procedures / Treatments   Labs (all labs ordered are listed, but only abnormal results are displayed) Labs Reviewed  COMPREHENSIVE METABOLIC PANEL - Abnormal; Notable for the following components:      Result Value   Glucose, Bld 127 (*)    Albumin 3.2 (*)    All other components within normal limits  CBC WITH DIFFERENTIAL/PLATELET - Abnormal; Notable for the following components:   WBC 19.9 (*)    Neutro Abs 16.2 (*)    Monocytes Absolute 1.5 (*)    Abs Immature Granulocytes 0.13 (*)    All other components within normal limits  PROTIME-INR - Abnormal; Notable for the following components:   Prothrombin Time 16.4 (*)    INR 1.3 (*)    All other components within normal limits  URINALYSIS, ROUTINE W REFLEX MICROSCOPIC - Abnormal; Notable for the following components:   Color, Urine AMBER (*)    APPearance TURBID (*)    Hgb urine dipstick SMALL (*)    Ketones, ur  5 (*)    Protein, ur 30 (*)    Leukocytes,Ua SMALL (*)    WBC, UA >50 (*)    Bacteria, UA MANY (*)    All other components within normal limits  RESP PANEL BY RT-PCR (FLU A&B, COVID) ARPGX2  CULTURE, BLOOD (ROUTINE X 2)  CULTURE, BLOOD (ROUTINE X 2)  LACTIC ACID, PLASMA  PROCALCITONIN  TROPONIN I (HIGH SENSITIVITY)     EKG  ED ECG REPORT I, Blake Divine, the attending physician, personally viewed and interpreted this ECG.   Date: 12/26/2021  EKG Time: 5:31  Rate: 82  Rhythm: normal sinus rhythm  Axis: Normal  Intervals:none  ST&T Change: None  RADIOLOGY Chest x-ray reviewed and interpreted by me with no infiltrate, edema, or effusion.  PROCEDURES:  Critical Care performed: Yes, see critical care procedure note(s)  .Critical Care  Performed by: Blake Divine,  MD Authorized by: Blake Divine, MD   Critical care provider statement:    Critical care time (minutes):  30   Critical care time was exclusive of:  Separately billable procedures and treating other patients and teaching time   Critical care was necessary to treat or prevent imminent or life-threatening deterioration of the following conditions:  Sepsis   Critical care was time spent personally by me on the following activities:  Development of treatment plan with patient or surrogate, discussions with consultants, evaluation of patient's response to treatment, examination of patient, ordering and review of laboratory studies, ordering and review of radiographic studies, ordering and performing treatments and interventions, pulse oximetry, re-evaluation of patient's condition and review of old charts   I assumed direction of critical care for this patient from another provider in my specialty: no     Care discussed with: admitting provider      [place] [facility]: Medications  ceFEPIme (MAXIPIME) 2 g in sodium chloride 0.9 % 100 mL IVPB (2 g Intravenous New Bag/Given 12/26/21 0647)  metroNIDAZOLE (FLAGYL) IVPB 500 mg (500 mg Intravenous New Bag/Given 12/26/21 0634)  acetaminophen (TYLENOL) suppository 650 mg (0 mg Rectal Hold 12/26/21 0647)  vancomycin (VANCOREADY) IVPB 1500 mg/300 mL (has no administration in time range)  lactated ringers bolus 1,000 mL (1,000 mLs Intravenous New Bag/Given 12/26/21 0634)    And  lactated ringers bolus 1,000 mL (1,000 mLs Intravenous New Bag/Given 12/26/21 0630)  iohexol (OMNIPAQUE) 300 MG/ML solution 100 mL (100 mLs Intravenous Contrast Given 12/26/21 0654)     IMPRESSION / MDM / [place] / ED COURSE  I reviewed the triage vital signs and the nursing notes.                              88 y.o. female with past medical history of hypertension, diabetes, CAD, PE on Eliquis, hypothyroidism, seizures, and Alzheimer's dementia who presents to  the ED for increased weakness with poor p.o. intake for the past 2 to 3 days, now with fever and hypoxia starting today.  Patient's presentation is most consistent with acute presentation with potential threat to life or bodily function.  Differential diagnosis includes, but is not limited to, sepsis, UTI, pneumonia, CHF, COPD, COVID-19, influenza, AKI, electrolyte abnormality, intra-abdominal infection.  Patient ill-appearing but in no acute distress, vital signs remarkable for fever and borderline low blood pressure.  Presentation concerning for sepsis and we will start patient on broad-spectrum antibiotics, with low BP will give 30 cc/kg IV fluid bolus.  Sepsis workup initiated including  blood cultures and lactic acid, will further assess with chest x-ray, urinalysis, and CT of her abdomen/pelvis given tenderness.  EKG shows no evidence of arrhythmia or ischemia.  Daughters at bedside confirmed that patient is DNR.  Chest x-ray is unremarkable, patient appears more alert following initial IV fluid bolus, currently receiving broad-spectrum antibiotics.  Urinalysis appears concerning for infection and was sent for culture, appears to be the likely source of her sepsis.  Labs markable for leukocytosis with no significant anemia, electrolyte abnormality, or AKI.  Lactic acid within normal limits, testing for COVID and flu is negative.  Patient turned over to oncoming providers pending CT results and admission for apparent urosepsis.      FINAL CLINICAL IMPRESSION(S) / ED DIAGNOSES   Final diagnoses:  Sepsis without acute organ dysfunction, due to unspecified organism [facility])  Acute cystitis without hematuria     Rx / DC Orders   ED Discharge Orders     None        Note:  This document was prepared using Dragon voice recognition software and may include unintentional dictation errors.   Blake Divine, MD 12/26/21 [phone]

## 2021-12-26 NOTE — Progress Notes (Signed)
PHARMACY -  BRIEF ANTIBIOTIC NOTE   Pharmacy has received consult(s) for Cefepime and Vancomycin from an ED provider.  The patient's profile has been reviewed for ht/wt/allergies/indication/available labs.    One time order(s) placed for Cefepime 2 gm IV X 1 and Vancomycin 1500 mg IV X 1.   Further antibiotics/pharmacy consults should be ordered by admitting physician if indicated.                       Thank you, Emileo Semel D 12/26/2021  5:42 AM

## 2021-12-26 NOTE — Assessment & Plan Note (Addendum)
Patient presents to the ER for evaluation of poor oral intake and a fever as well as hypoxia with room air pulse oximetry in the low 80s.  Patient is currently on 3 L of oxygen with pulse oximetry greater than 92% She has marked leukocytosis of 19,000 Imaging shows multifocal nodules and peribronchovascular consolidations, involving all lobes, though most pronounced in the right lower lobe. Findings are most consistent with multifocal pneumonia. Patient has dysphagia and recurrent choking episodes which family states has become more frequent and is at high risk for aspiration Place patient on aspiration precautions Start patient on Zosyn adjusted to renal function

## 2021-12-26 NOTE — Evaluation (Signed)
Clinical/Bedside Swallow Evaluation Patient Details  Name: Krista Ellis MRN: [id number] Date of Birth: 1930-05-06  Today's Date: 12/26/2021 Time: SLP Start Time (ACUTE ONLY): 12 SLP Stop Time (ACUTE ONLY): 8416 SLP Time Calculation (min) (ACUTE ONLY): 60 min  Past Medical History:  Past Medical History:  Diagnosis Date   Arthritis    Bleeding disorder ([place])    Chronic cystitis    Collagen vascular disease ([place])    CVA (cerebral infarction) 2003   Dysuria    Gross hematuria    Hematuria    Hemihypertrophy    History of kidney stones    Hyperlipidemia    Hypertension    Murmur, cardiac    Myocardial infarction [facility]    Peripheral vascular disease ([place])    s/p stent right leg   Pulmonary nodule    stable on Chest CT March 2014, Followed at [facility]   Recurrent UTI    Sensory urge incontinence    Thyroid disease    Urinary frequency    Past Surgical History:  Past Surgical History:  Procedure Laterality Date   ABDOMINAL HYSTERECTOMY  1982   APPENDECTOMY     CARDIAC SURGERY     2 failed stents in right leg   [place]  2007   replaced ball in right hip   left rotator cuff     repair   ruptured disc     SPINE SURGERY     steel rod in back   stent placement leg Right 2015   TONSILLECTOMY     TOTAL HIP ARTHROPLASTY     right   TOTAL KNEE ARTHROPLASTY Left    HPI:  Pt is a 88 y.o. female with medical history significant for advanced Dementia w/ behavioral issues at times, malnutrition, UTIs, prior CVA, peripheral vascular disease, history of pulmonary embolism, hypothyroidism, hypertension, chronic respiratory failure on 3 L of oxygen as needed, who resides at [facility] and was brought into the ER for evaluation of fever and hypoxia.  Family members reported pt had an episode of N/V w/ Vomiting.  Patient has had poor oral intake over the last several days.  Chart review shows that patient was seen by her primary care provider and there was  a concern for possible aspiration pneumonia of both lower lobes due to gastric secretions.  Patient also noted to have moderate malnutrition.  Daughter also states that she has complained of some lower abdominal pain.  Chest CT: Multifocal nodules and peribronchovascular consolidations,  involving all lobes, though most pronounced in the right lower lobe.  Findings are most consistent with multifocal pneumonia. Recommend  follow-up chest CT in 3 months after treatment to ensure resolution.  OF NOTE: last CXR prior was in 2019 w/ "no active" disease process noted.    Assessment / Plan / Recommendation  Clinical Impression   Pt seen today for BSE. Daughters present during session; spoke w/ Niece via TC. Pt awake, verbal and engaged fairly well during session but did not overly accept many po trials stating "I don't really want anymore". Noted pt's oral intake has been declining; Baseline dx of Malnutrition per chart. Also noted recent report of N/V by Family. Pt on RA after taking off [place] O2 support -- O2 sats remained 94-95% during session, afebrile.   Pt appears to present w/ oral phase dysphagia in setting of declined Cognitive status; Baseline Advanced Dementia; pt is also Missing Most Dentition(for effective mastication). Family has reported  that pt's Dementia may "worsening"; noted some behavioral issues and declining oral intake per chart notes. Cognitive decline can impact overall awareness/timing of swallow and safety during po tasks which increases risk for aspiration, choking as well as pulmonary decline. Pt's risk for aspiration appears to be reduced when following general aspiration precautions, supporting pt's eating/drinking, and using a modified diet consistency d/t Edentulous status at baseline.  She is also fairly dependent on being fed; she required min-mod verbal/visual cues for follow through during po tasks.        Pt consumed several trials of thin liquids via Straw/tsp and purees w/ No  overt clinical s/s of aspiration noted: no decline in vocal quality; no cough, and no decline in respiratory status during/post trials. No change in O2 sats from baseline. Oral phase was adequate for bolus management and oral clearing of the boluses given. Fairly timely oral phase management of the boluses; full oral clearing b/t bites w/ lingual sweeping noted.  OM Exam was cursory d/t poor follow through w/ instructions but appeared [facility] w/ No unilateral weakness noted. Full lingual ROM and strength to pull boluses from the spoon noted; labial seal on straw appropriate.       In setting of baseline Dementia, Cognitive decline, Dentition status, and Acute illness/admit to the hospital, recommend initiation of diet w/ a dysphagia level 1(puree foods moistened for ease of oral phase and support of oral intake) w/ thin liquids; general aspiration precautions; reduce Distractions during meals and engage pt during meals for self-feeding - have pt help Hold Cup. Pills Crushed in Puree for safer swallowing as needed. Support w/ feeding and positioning Upright at meals. Reduce distractions at meals. MD/NSG updated.  ST services recommends follow w/ Palliative Care for [place] and education re: impact of Cognitive decline/Dementia on swallowing; oral intake and Malnutrition overall. Suspect pt is close to/at her baseline. Precautions posted in room, chart. ST services will f/u tomorrow for toleration of diet and trials of minced foods as appropriate. SLP Visit Diagnosis: Dysphagia, oral phase (R13.11) (baseline Dementia)    Aspiration Risk  Mild aspiration risk;Risk for inadequate nutrition/hydration (reduced following precautions)    Diet Recommendation   dysphagia level 1(puree foods moistened for ease of oral phase and support of oral intake) w/ thin liquids; general aspiration precautions; reduce Distractions during meals and engage pt during meals for self-feeding - have pt help Hold Cup. Support w/ feeding and  positioning Upright at meals. Reduce distractions at meals.  Medication Administration: Crushed with puree    Other  Recommendations Recommended Consults:  (Palliative Care for [place]; Dietician f/u) Oral Care Recommendations: Oral care BID;Oral care before and after PO;Staff/trained caregiver to provide oral care Other Recommendations:  (n/a)    Recommendations for follow up therapy are one component of a multi-disciplinary discharge planning process, led by the attending physician.  Recommendations may be updated based on patient status, additional functional criteria and insurance authorization.  Follow up Recommendations Follow physician's recommendations for discharge plan and follow up therapies      Assistance Recommended at Discharge  Full assistance at meals  Functional Status Assessment Patient has had a recent decline in their functional status and/or demonstrates limited ability to make significant improvements in function in a reasonable and predictable amount of time  Frequency and Duration min 2x/week  1 week       Prognosis Prognosis for Safe Diet Advancement: Fair Barriers to Reach Goals: Cognitive deficits;Language deficits;Time post onset;Severity of deficits;Behavior Barriers/Prognosis Comment: baseline advanced  Dementia      Swallow Study   General Date of Onset: 12/26/21 HPI: Pt is a 88 y.o. female with medical history significant for advanced Dementia w/ behavioral issues at times, malnutrition, UTIs, prior CVA, peripheral vascular disease, history of pulmonary embolism, hypothyroidism, hypertension, chronic respiratory failure on 3 L of oxygen as needed, who resides at [facility] and was brought into the ER for evaluation of fever and hypoxia.  Family members reported pt had an episode of N/V w/ Vomiting.  Patient has had poor oral intake over the last several days.  Chart review shows that patient was seen by her primary care provider and there was a concern for  possible aspiration pneumonia of both lower lobes due to gastric secretions.  Patient also noted to have moderate malnutrition.  Daughter also states that she has complained of some lower abdominal pain.  Chest CT: Multifocal nodules and peribronchovascular consolidations,  involving all lobes, though most pronounced in the right lower lobe.  Findings are most consistent with multifocal pneumonia. Recommend  follow-up chest CT in 3 months after treatment to ensure resolution.  OF NOTE: last CXR prior was in 2019 w/ "no active" disease process noted. Type of Study: Bedside Swallow Evaluation Previous Swallow Assessment: 2018; 2016.  Regular diet, thins w/ precs. Diet Prior to this Study: Regular;Thin liquids ("soft foods" per family) Temperature Spikes Noted: No (wbc 19.9) Respiratory Status: Nasal cannula (3L - not wearing w/ O2 sats 94-95%) History of Recent Intubation: No Behavior/Cognition: Alert;Cooperative;Pleasant mood;Confused;Distractible;Requires cueing Oral Cavity Assessment: Within Functional Limits Oral Care Completed by SLP: Recent completion by staff Oral Cavity - Dentition: Poor condition;Missing dentition (missing most) Vision:  (n/a) Self-Feeding Abilities: Total assist Patient Positioning: Upright in bed (needed positioning support) Baseline Vocal Quality: Normal Volitional Cough: Congested;Strong Volitional Swallow: Unable to elicit (confusion re: task)    Oral/Motor/Sensory Function Overall Oral Motor/Sensory Function: Within functional limits (no unilateral weakness noted in lingual/labial movements)   Ice Chips Ice chips: Not tested Other Comments: d/t confusion   Thin Liquid Thin Liquid: Within functional limits Presentation: Straw (supported; 8-9 trials total) Other Comments: 1 via tsp; rest by straw w/ 2-3 multiple sips    Nectar Thick Nectar Thick Liquid: Not tested   Honey Thick Honey Thick Liquid: Not tested   Puree Puree: Within functional limits Presentation:  Spoon (fed; 7 trials)   Solid     Solid: Not tested         Orinda Kenner, MS, CCC-SLP Speech Language Pathologist Rehab Services; [place] [phone] (ascom) Krista Ellis 12/26/2021,5:19 PM

## 2021-12-26 NOTE — Assessment & Plan Note (Addendum)
As evidenced by fever, hypotension that responded to IV fluid resuscitation, acute respiratory failure with hypoxia as evidenced by room air pulse oximetry in the low 80s, marked leukocytosis, pyuria and imaging showing multifocal pneumonia. Continue IV fluid resuscitation Continue oxygen supplementation to maintain pulse oximetry greater than 92% Treat patient empirically with Zosyn adjusted to renal function Follow-up results of blood cultures

## 2021-12-26 NOTE — ED Notes (Signed)
Patient transported to CT 

## 2021-12-26 NOTE — Sepsis Progress Note (Signed)
Elink monitoring for the code sepsis protocol.  

## 2021-12-26 NOTE — Consult Note (Signed)
Pharmacy Antibiotic Note  Krista Ellis is a 88 y.o. female w/ h/o CVA, PVD, PE, hypothyroidism, HTN, Vasc dementia, chronic respiratory failure on 3 L of oxygen as needed who resides at [facility] and was brought into the ER for evaluation of fever and hypoxia. Pt's family reports c/f dysphagia and recurring choking episodes with increased c/f aspiration pneumonia.  Pharmacy has been consulted for zosyn dosing.  Plan: Pt received Vancomycin 1.5g IV x1, cefepime 2g IV x1, and metronidazole '500mg'$  IV x1 in ED (12/07 0755) Initiate Zosyn 3.375g IV q8h (4 hour infusion).  Height: '5\' 5"'$  (165.1 cm) Weight: 60.7 kg (133 lb 12.8 oz) IBW/kg (Calculated) : 57  Temp (24hrs), Avg:99.8 F (37.7 C), Min:97.4 F (36.3 C), Max:101.2 F (38.4 C)  Recent Labs  Lab 12/26/21 0519  WBC 19.9*  CREATININE 0.84  LATICACIDVEN 1.2    Estimated Creatinine Clearance: 40.9 mL/min (by C-G formula based on SCr of 0.84 mg/dL).    Allergies  Allergen Reactions   Levaquin [Levofloxacin]    Sulfa Antibiotics Hives and Itching   Ciprofloxacin Hives and Itching   Doxycycline Hives and Itching   Ferrous Sulfate Hives and Itching   Penicillins Hives and Itching    .Has patient had a PCN reaction causing immediate rash, facial/tongue/throat swelling, SOB or lightheadedness with hypotension: Unknown Has patient had a PCN reaction causing severe rash involving mucus membranes or skin necrosis: Unknown Has patient had a PCN reaction that required hospitalization: Unknown Has patient had a PCN reaction occurring within the last 10 years: Unknown If all of the above answers are "NO", then may proceed with Cephalosporin use.     Antimicrobials this admission: VAN/CFP/MTZ x1 in ED (12/7) Zosyn (12/7 >>   Dose adjustments this admission: CTM and adjust PRN  Microbiology results: 12/7 BCx: pending 12/7 COVID/FLU: negative    Thank you for allowing pharmacy to be a part of this patient's care.  Krista Ellis  Krista Ellis 12/26/2021 10:18 AM

## 2021-12-26 NOTE — Assessment & Plan Note (Signed)
Hold all antihypertensive medications for now

## 2021-12-26 NOTE — ED Notes (Signed)
ED Provider at bedside. 

## 2021-12-26 NOTE — ED Notes (Signed)
Family updated as to patient's status.

## 2021-12-26 NOTE — ED Provider Notes (Signed)
Emergency department handoff note  Care of this patient was signed out to me at the end of the previous provider shift.  All pertinent patient information was conveyed and all questions were answered.  Patient pending CT of the abdomen and pelvis with IV contrast that shows no intra-abdominal pathology however does show evidence of possible atelectasis versus pneumonia versus malignancy in the chest.  As patient has just gotten contrast at this time, she will need hydration and repeat of kidney function prior to CT of the chest for further evaluation of these abnormalities.  Patient will require admission to the internal medicine service given sepsis likely secondary to urinary tract infection as well as altered mental status.  Dispo: Admit to medicine   Naaman Plummer, MD 12/26/21 [phone]

## 2021-12-26 NOTE — ED Triage Notes (Addendum)
Pt arrived via [facility] from [facility], 0100, hypoxic from 90s to low 80s temp 100.6 lungs clear with EMS, poor PO intake x 2-3 days.  Pt wears 3L [place] PRN. Pt is a/c at this time. Per EMS pt has been combative at times.

## 2021-12-26 NOTE — Assessment & Plan Note (Signed)
Patient has a known history of dysphagia, pharyngeal esophageal phase with frequent choking episodes resulting in aspiration. Request speech therapy evaluation

## 2021-12-26 NOTE — Progress Notes (Signed)
CODE SEPSIS - PHARMACY COMMUNICATION  **Broad Spectrum Antibiotics should be administered within 1 hour of Sepsis diagnosis**  Time Code Sepsis Called/Page Received:  12/7 @ 0528   Antibiotics Ordered: Flagyl, cefepime , vancomycin   Time of 1st antibiotic administration: metronidazole 500 mg IV on 12/7 @ [phone]  Additional action taken by pharmacy:   If necessary, Name of Provider/Nurse Contacted:     Syvanna Ciolino D ,PharmD Clinical Pharmacist  12/26/2021  6:49 AM

## 2021-12-27 DIAGNOSIS — N390 Urinary tract infection, site not specified: Secondary | ICD-10-CM | POA: Diagnosis not present

## 2021-12-27 DIAGNOSIS — A419 Sepsis, unspecified organism: Secondary | ICD-10-CM | POA: Diagnosis not present

## 2021-12-27 LAB — CBC
HCT: 35.2 % — ABNORMAL LOW (ref 36.0–46.0)
Hemoglobin: 11.3 g/dL — ABNORMAL LOW (ref 12.0–15.0)
MCH: 30.6 pg (ref 26.0–34.0)
MCHC: 32.1 g/dL (ref 30.0–36.0)
MCV: 95.4 fL (ref 80.0–100.0)
Platelets: 301 10*3/uL (ref 150–400)
RBC: 3.69 MIL/uL — ABNORMAL LOW (ref 3.87–5.11)
RDW: 11.9 % (ref 11.5–15.5)
WBC: 17.7 10*3/uL — ABNORMAL HIGH (ref 4.0–10.5)
nRBC: 0 % (ref 0.0–0.2)

## 2021-12-27 LAB — BASIC METABOLIC PANEL
Anion gap: 5 (ref 5–15)
BUN: 10 mg/dL (ref 8–23)
CO2: 27 mmol/L (ref 22–32)
Calcium: 9.2 mg/dL (ref 8.9–10.3)
Chloride: 105 mmol/L (ref 98–111)
Creatinine, Ser: 0.67 mg/dL (ref 0.44–1.00)
GFR, Estimated: >60 mL/min (ref >60)
Glucose, Bld: 107 mg/dL — ABNORMAL HIGH (ref 70–99)
Potassium: 4.2 mmol/L (ref 3.5–5.1)
Sodium: 137 mmol/L (ref 135–145)

## 2021-12-27 LAB — PHOSPHORUS: Phosphorus: 2.7 mg/dL (ref 2.5–4.6)

## 2021-12-27 LAB — PROTIME-INR
INR: 1.4 — ABNORMAL HIGH (ref 0.8–1.2)
Prothrombin Time: 16.6 seconds — ABNORMAL HIGH (ref 11.4–15.2)

## 2021-12-27 LAB — PROCALCITONIN: Procalcitonin: 0.25 ng/mL

## 2021-12-27 LAB — FOLATE: Folate: 11.6 ng/mL (ref >5.9)

## 2021-12-27 LAB — IRON AND TIBC
Iron: 14 ug/dL — ABNORMAL LOW (ref 28–170)
Saturation Ratios: 10 % — ABNORMAL LOW (ref 10.4–31.8)
TIBC: 144 ug/dL — ABNORMAL LOW (ref 250–450)
UIBC: 130 ug/dL

## 2021-12-27 LAB — MAGNESIUM: Magnesium: 1.8 mg/dL (ref 1.7–2.4)

## 2021-12-27 LAB — CORTISOL-AM, BLOOD: Cortisol - AM: 15.9 ug/dL (ref 6.7–22.6)

## 2021-12-27 MED ORDER — MIDODRINE HCL 5 MG PO TABS
10.0000 mg | ORAL_TABLET | Freq: Once | ORAL | Status: AC
  Administered 2021-12-27: 10 mg via ORAL
  Filled 2021-12-27: qty 2

## 2021-12-27 MED ORDER — MIDODRINE HCL 5 MG PO TABS
5.0000 mg | ORAL_TABLET | Freq: Three times a day (TID) | ORAL | Status: DC
  Administered 2021-12-27 – 2021-12-30 (×8): 5 mg via ORAL
  Filled 2021-12-27 (×8): qty 1

## 2021-12-27 NOTE — ED Notes (Signed)
Family member at the bedside provided recliner for comfort. Patient in no distress at this time. CB in reach.

## 2021-12-27 NOTE — ED Notes (Signed)
This RN to bedside. Pt gown, brief, chucks pad, and bedding saturated with urine. Purewick was not working properly. Pt cleaned, new bedding, new gown, new purewick on pt. Pt positioned in bed for comfort. No further needs at this time, family at bedside, [facility].

## 2021-12-27 NOTE — ED Notes (Signed)
BP low at this time, values charted. Kumar notified, will medicate per [facility].

## 2021-12-27 NOTE — Progress Notes (Signed)
Speech Language Pathology Treatment: Dysphagia  Patient Details Name: Krista Ellis MRN: [id number] DOB: 11-14-1930 Today's Date: 12/27/2021 Time: 1225-1310 SLP Time Calculation (min) (ACUTE ONLY): 45 min  Assessment / Plan / Recommendation Clinical Impression  Pt seen today for toleration of diet; education w/ Family re: pt's swallowing and supportive strategies.  Daughter present during session. Pt awake, verbal and engaged intermittently during session but did not overly accept many po trials -- Dtr was feeding pt Crushed Meds, NSG present.  Family notes pt's oral intake has been declining; Baseline dx of Malnutrition per chart. Also noted recent report of N/V by Family. Palliative Care has been consulted for pt/Family for [place] discussions. Pt on R; afebrile.    Pt appears to present w/ mild oral phase dysphagia in setting of declined Cognitive status; Baseline Advanced Dementia -- decreased energy for mastication required for full solid foods, and pt is also Missing Most Dentition(for effective mastication). Family had been feeding pt more broken down foods and reported that pt's Dementia may "worsening"; noted some behavioral issues and declining oral intake per chart notes. Cognitive decline can impact overall awareness/timing of swallow and safety during po tasks which increases risk for aspiration, choking as well as pulmonary decline. Pt's risk for aspiration appears to be reduced when following general aspiration precautions, supporting pt's eating/drinking, and using a modified diet consistency d/t Edentulous status at baseline.  Pt is dependent for being fed; she required min-mod verbal/visual cues for follow through during po tasks.        Pt consumed several trials of thin liquids via Straw and purees w/ No overt clinical s/s of aspiration noted: no decline in vocal quality; no cough, and no decline in respiratory status during/post trials. Oral phase was adequate for bolus management  and oral clearing of the boluses given. Fairly timely oral phase management of the boluses; full oral clearing b/t bites w/ lingual sweeping noted.       In setting of baseline Dementia, Cognitive decline, poor Dentition status, advanced Age, and Acute illness/admit to the hospital, recommend continue diet of dysphagia level 1(puree foods moistened for ease of oral phase and support of oral intake) w/ thin liquids; general aspiration precautions; reduce Distractions during meals and engage pt during meals for self-feeding - have pt help Hold Cup. Pills Crushed in Puree for safer swallowing as needed. Support w/ feeding and positioning Upright at meals. Reduce distractions at meals. As pt's energy and Stamina improve, she may be able to trial more increased textured foods such as a MINCED foods diet consistency -- this was discussed w/ Dtr who agreed to monitor this when pt returns to her [place].  Education provided to Dtr on aspiration precautions; food consistencies; supportive strategies during meals; quality of life choices re: her foods/liquids.  MD/NSG updated.   ST services recommends follow w/ Palliative Care for [place] and education re: impact of Cognitive decline/Dementia on swallowing; oral intake and Malnutrition overall. Suspect pt is close to/at her baseline. Precautions posted in room, chart. No further skilled ST services at this time. Recommend ongoing assessment of status POST Discharge back to her Living Facility, and POST acuity of illness, if indicated. Daughter agreed.       HPI HPI: Pt is a 88 y.o. female with medical history significant for advanced Dementia w/ behavioral issues at times, malnutrition, UTIs, prior CVA, peripheral vascular disease, history of pulmonary embolism, hypothyroidism, hypertension, chronic respiratory failure on 3 L of oxygen as needed, who resides at  [facility] and was brought into the ER for evaluation of fever and hypoxia.  Family members reported  pt had an episode of N/V w/ Vomiting.  Patient has had poor oral intake over the last several days.  Chart review shows that patient was seen by her primary care provider and there was a concern for possible aspiration pneumonia of both lower lobes due to gastric secretions.  Patient also noted to have moderate malnutrition.  Daughter also states that she has complained of some lower abdominal pain.  Chest CT: Multifocal nodules and peribronchovascular consolidations,  involving all lobes, though most pronounced in the right lower lobe.  Findings are most consistent with multifocal pneumonia. Recommend  follow-up chest CT in 3 months after treatment to ensure resolution.  OF NOTE: last CXR prior was in 2019 w/ "no active" disease process noted.      SLP Plan  All goals met      Recommendations for follow up therapy are one component of a multi-disciplinary discharge planning process, led by the attending physician.  Recommendations may be updated based on patient status, additional functional criteria and insurance authorization.    Recommendations  Diet recommendations: Dysphagia 1 (puree);Thin liquid Liquids provided via: Cup;Straw (support) Medication Administration: Crushed with puree Supervision: Staff to assist with self feeding;Full supervision/cueing for compensatory strategies Compensations: Minimize environmental distractions;Slow rate;Small sips/bites;Lingual sweep for clearance of pocketing;Multiple dry swallows after each bite/sip;Follow solids with liquid Postural Changes and/or Swallow Maneuvers: Out of bed for meals;Seated upright 90 degrees;Upright 30-60 min after meal                General recommendations:  (Palliative Care f/u; Dietician f/u) Oral Care Recommendations: Oral care BID;Oral care before and after PO;Staff/trained caregiver to provide oral care Follow Up Recommendations: No SLP follow up Assistance recommended at discharge: Frequent or constant  Supervision/Assistance (feeding support) SLP Visit Diagnosis: Dysphagia, oropharyngeal phase (R13.12) (baseline Dementia; deconditioning) Plan: All goals met             Orinda Kenner, MS, CCC-SLP Speech Language Pathologist Rehab Services; [place] [phone] (ascom) Syd Manges  12/27/2021, 3:14 PM

## 2021-12-27 NOTE — Progress Notes (Signed)
[facility] Progress Note  Patient: Krista Ellis    CBJ:[id number]  DOA: 12/26/2021     Date of Service: the patient was seen and examined on 12/27/2021  Chief Complaint  Patient presents with   Shortness of Breath   Brief hospital course: Krista Ellis is a 88 y.o. female with medical history significant for CVA, history of peripheral vascular disease, history of pulmonary embolism, hypothyroidism, hypertension, vascular dementia, chronic respiratory failure on 3 L of oxygen as needed who resides at [facility] and was brought into the ER for evaluation of fever and hypoxia. Patient has had poor oral intake over the last several days.  Patient has dysphagia and concern for possible aspiration pneumonia seen by PCP recently.  ED workup:  Tmax of 101.2 upon arrival to the ER, was said to be hypotensive and responded to IV fluid resuscitation.  She was hypoxic with room air pulse oximetry in the low 80s and is currently on her 3 L of oxygen by nasal cannula. Labs show a white count of 19,000 and pyuria Patient received a dose of cefepime, vancomycin and Flagyl. Patient was admitted under hospitalist service for further management as below.   Assessment and Plan:  * Sepsis secondary to UTI ([place]) Sepsis criteria fever, hypotension, respiratory failure with hypoxia, leukocytosis, pyuria, imaging shows pneumonia. Continue IV fluid resuscitation Continue oxygen supplementation to maintain pulse oximetry greater than 92% Treat patient empirically with Zosyn adjusted to renal function Follow-up results of blood cultures    Aspiration pneumonia [facility]) Imaging shows multifocal nodules and peribronchovascular consolidations, involving all lobes, though most pronounced in the right lower lobe. Findings are most consistent with multifocal pneumonia. Supplemental O2 admission weaned off, currently on room air. Continue above antibiotics Continue aspiration precautions, seen by  SLP   Hypotension due to sepsis Continue IV fluid for hydration Started midodrine 5 mg p.o. 3 times daily with holding parameters Monitor BP and titrate medication accordingly    Malnutrition of moderate degree (HCC) Related to known dementia and poor oral intake Will consult dietitian   Hypothyroidism Continue Synthroid   Hypertension Hold all antihypertensive medications for now   History of PE, continue Eliquis  Dysphagia, pharyngoesophageal phase-resolved as of 06/01/2013 Patient has a known history of dysphagia, pharyngeal esophageal phase with frequent choking episodes resulting in aspiration. speech therapy evaluation done  Dementia, continued [place]   Body mass index is 22.27 kg/m.  Interventions:    Diet: Dysphagia 1 diet with thin liquids, continue aspiration precautions Follow SLP recommendation  DVT Prophylaxis: Therapeutic Anticoagulation with apixaban    Advance goals of care discussion: DNR  Family Communication: family was present at bedside, at the time of interview.  The pt provided permission to discuss medical plan with the family. Opportunity was given to ask question and all questions were answered satisfactorily.  Discussed with patient's daughter at bedside if her condition does not improve then patient may need palliative care consult for hospice placement.   Disposition:  Pt is from  [place] long-term care, admitted with sepsis, UTI and aspiration pneumonia, still has low blood pressure, on IV antibiotics, cultures pending, which precludes a safe discharge. Discharge to [place] long-term care, when stable may need 2-3 days   Subjective: No significant overnight events, patient still has chest congestion, cough and shortness of breath.  Patient denied any chest pain or palpitation, no any other active issues.  Physical Exam: General:  alert, AAO x 1 oriented to herself only.Marland Kitchen  Appear  in mild distress, affect appropriate Eyes:  PERRLA ENT: Oral Mucosa Clear, moist  Neck: no JVD,  Cardiovascular: S1 and S2 Present, no Murmur,  Respiratory: good respiratory effort, Bilateral Air entry equal and Decreased, bilateral crackles, no significant wheezes Abdomen: Bowel Sound present, Soft and no tenderness,  Skin: No rashes Extremities: No pedal edema, no calf tenderness Neurologic: without any new focal findings Gait not checked due to patient safety concerns  Vitals:   12/27/21 1400 12/27/21 1430 12/27/21 1436 12/27/21 1500  BP: (!) 95/30 (!) 101/27 (!) 77/31 (!) 101/33  Pulse: 77 73 73 72  Resp: (!) 21 18 (!) 21 (!) 21  Temp:      TempSrc:      SpO2: 98% 96% 98% 97%  Weight:      Height:       No intake or output data in the 24 hours ending 12/27/21 1533 Filed Weights   12/26/21 0508  Weight: 60.7 kg    Data Reviewed: I have personally reviewed and interpreted daily labs, tele strips, imagings as discussed above. I reviewed all nursing notes, pharmacy notes, vitals, pertinent old records I have discussed plan of care as described above with RN and patient/family.  CBC: Recent Labs  Lab 12/26/21 0519 12/27/21 0743  WBC 19.9* 17.7*  NEUTROABS 16.2*  --   HGB 12.6 11.3*  HCT 39.9 35.2*  MCV 96.6 95.4  PLT 343 161   Basic Metabolic Panel: Recent Labs  Lab 12/26/21 0519 12/27/21 0743  NA 135 137  K 4.3 4.2  CL 101 105  CO2 26 27  GLUCOSE 127* 107*  BUN 13 10  CREATININE 0.84 0.67  CALCIUM 9.7 9.2    Studies: No results found.  Scheduled Meds:  acetaminophen  650 mg Rectal Once   apixaban  2.5 mg Oral BID   cyanocobalamin  500 mcg Oral Daily   feeding supplement  237 mL Oral TID BM   levothyroxine  25 mcg Oral Q0600   memantine  10 mg Oral BID   midodrine  5 mg Oral TID WC   multivitamin with minerals  1 tablet Oral Daily   phenazopyridine  100 mg Oral TID WC   Continuous Infusions:  lactated ringers 125 mL/hr at 12/27/21 1439   piperacillin-tazobactam (ZOSYN)  IV 3.375 g  (12/27/21 1440)   PRN Meds: acetaminophen **OR** acetaminophen, guaiFENesin-dextromethorphan, ondansetron **OR** ondansetron (ZOFRAN) IV, senna  Time spent: 35 minutes  Author: Val Riles. MD [facility] 12/27/2021 3:33 PM  To reach On-call, see care teams to locate the attending and reach out to them via [email or web address]. If 7PM-7AM, please contact night-coverage If you still have difficulty reaching the attending provider, please page the [facility] (Director on Call) for [facility] on amion for assistance.

## 2021-12-28 DIAGNOSIS — N390 Urinary tract infection, site not specified: Secondary | ICD-10-CM | POA: Diagnosis not present

## 2021-12-28 DIAGNOSIS — A419 Sepsis, unspecified organism: Secondary | ICD-10-CM | POA: Diagnosis not present

## 2021-12-28 MED ORDER — BENZOCAINE 10 % MT GEL
Freq: Three times a day (TID) | OROMUCOSAL | Status: DC | PRN
  Filled 2021-12-28: qty 9

## 2021-12-28 MED ORDER — GUAIFENESIN ER 600 MG PO TB12
600.0000 mg | ORAL_TABLET | Freq: Two times a day (BID) | ORAL | Status: DC
  Administered 2021-12-28 – 2021-12-30 (×5): 600 mg via ORAL
  Filled 2021-12-28 (×5): qty 1

## 2021-12-28 MED ORDER — HYDROCOD POLI-CHLORPHE POLI ER 10-8 MG/5ML PO SUER: 5.0000 mL | Freq: Two times a day (BID) | ORAL | Status: DC | PRN

## 2021-12-28 NOTE — Evaluation (Signed)
Occupational Therapy Evaluation Patient Details Name: Krista Ellis MRN: [id number] DOB: [birth year] Today's Date: 12/28/2021   History of Present Illness Krista Ellis is a 88 y.o. female with medical history significant for CVA, history of peripheral vascular disease, history of pulmonary embolism, hypothyroidism, hypertension, vascular dementia, chronic respiratory failure on 3 L of oxygen as needed who resides at [facility] and was brought into the ER for evaluation of fever and hypoxia.   Clinical Impression   Patient seen for OT evaluation, daughter present. Pt is a poor historian 2/2 baseline cognitive deficits. PLOF obtained from daughter. Pt oriented to name only. She required multimodal cues throughout evaluation for initiation, sequencing, safety awareness, and problem solving. Prior to admission, pt was requiring assistance from staff at [facility] for all ADLs and completing SPT to manual wheelchair/recliner/toilet. Pt currently functioning at Max A for supine to sit, Max A +2 for squat pivot transfer from EOB>recliner, and total A for LB dressing. Pt is near baseline level of function, however, will keep on OT caseload in order to prevent deconditioning. Recommend pt return to [facility] upon D/C with no formal f/u therapy.   Recommendations for follow up therapy are one component of a multi-disciplinary discharge planning process, led by the attending physician.  Recommendations may be updated based on patient status, additional functional criteria and insurance authorization.   Follow Up Recommendations  Long-term institutional care without follow-up therapy (return to [facility] where pt is long term resident)     Assistance Recommended at Discharge Frequent or constant Supervision/Assistance  Patient can return home with the following Two people to help with walking and/or transfers;Two people to help with bathing/dressing/bathroom;Assistance with feeding;Direct  supervision/assist for medications management;Direct supervision/assist for financial management;Assistance with cooking/housework;Assist for transportation;Help with stairs or ramp for entrance    Functional Status Assessment  Patient has had a recent decline in their functional status and demonstrates the ability to make significant improvements in function in a reasonable and predictable amount of time.  Equipment Recommendations  None recommended by OT    Recommendations for Other Services       Precautions / Restrictions Precautions Precautions: Fall Restrictions Weight Bearing Restrictions: No      Mobility Bed Mobility Overal bed mobility: Needs Assistance Bed Mobility: Supine to Sit     Supine to sit: Max assist, HOB elevated     General bed mobility comments: pt attempting to initiate LE movement    Transfers Overall transfer level: Needs assistance Equipment used: 2 person hand held assist Transfers: Bed to chair/wheelchair/BSC     Squat pivot transfers: +2 physical assistance, Max assist              Balance Overall balance assessment: Needs assistance Sitting-balance support: Feet supported, Bilateral upper extremity supported, No upper extremity supported Sitting balance-Leahy Scale: Fair Sitting balance - Comments: Max A initially for static sitting balance improving to Min guard   Standing balance support: Bilateral upper extremity supported Standing balance-Leahy Scale: Poor                             ADL either performed or assessed with clinical judgement   ADL Overall ADL's : Needs assistance/impaired Eating/Feeding: Set up;Bed level                   Lower Body Dressing: Total assistance;Bed level Lower Body Dressing Details (indicate cue type and reason): socks Toilet Transfer: +2  for physical assistance;Maximal assistance Toilet Transfer Details (indicate cue type and reason): simulated with squat pivot transfer  from EOB>recliner via [place] and Hygiene: Total assistance;Bed level               Vision Patient Visual Report: No change from baseline       Perception     Praxis      Pertinent Vitals/Pain Pain Assessment Pain Assessment: No/denies pain     Hand Dominance Left   Extremity/Trunk Assessment Upper Extremity Assessment Upper Extremity Assessment: Generalized weakness   Lower Extremity Assessment Lower Extremity Assessment: Generalized weakness       Communication Communication Communication: HOH   Cognition Arousal/Alertness: Awake/alert Behavior During Therapy: Agitated, Flat affect Overall Cognitive Status: History of cognitive impairments - at baseline Area of Impairment: Orientation, Attention, Memory, Following commands, Safety/judgement, Awareness, Problem solving                 Orientation Level: Disoriented to, Place, Time, Situation Current Attention Level: Focused Memory: Decreased recall of precautions, Decreased short-term memory Following Commands: Follows one step commands inconsistently Safety/Judgement: Decreased awareness of safety, Decreased awareness of deficits   Problem Solving: Slow processing, Decreased initiation, Difficulty sequencing, Requires verbal cues, Requires tactile cues General Comments: History of dementia, daughter reports pt is at baseline mental status. Oriented to name only. Became slightly agitated when attempting to assist pt with movement. Responds well to gospel music per daughter.     General Comments       Exercises Other Exercises Other Exercises: OT provided education re: role of OT, OT POC, post acute recs, sitting up for all meals, EOB/OOB mobility with assistance, home/fall safety.     Shoulder Instructions      Home Living Family/patient expects to be discharged to:: Skilled nursing facility                                 Additional Comments: Long term  care at [facility]      Prior Functioning/Environment Prior Level of Function : Needs assist  Cognitive Assist : ADLs (cognitive);Mobility (cognitive) Mobility (Cognitive): Step by step cues ADLs (Cognitive): Step by step cues Physical Assist : Mobility (physical);ADLs (physical) Mobility (physical): Bed mobility;Transfers ADLs (physical): Feeding;Grooming;Bathing;Dressing;Toileting;IADLs Mobility Comments: Per daughter, ~2-3 months ago pt was able to complete stand pivot transfers to toilet, recliner, and manual w/c. Pt needs assist for w/c propulsion and staff transport her to dining room for meals. Starting ~1 month ago, pt has been requiring hoyer lift transfers to manual w/c and recliner. ADLs Comments: Staff assist with all ADLs (completing at bed level for past couple months)        OT Problem List: Decreased strength;Decreased range of motion;Decreased activity tolerance;Impaired balance (sitting and/or standing);Decreased cognition;Decreased safety awareness;Decreased knowledge of use of DME or AE      OT Treatment/Interventions: Self-care/ADL training;Therapeutic exercise;Therapeutic activities;Cognitive remediation/compensation;DME and/or AE instruction;Patient/family education;Balance training    OT Goals(Current goals can be found in the care plan section) Acute Rehab OT Goals Patient Stated Goal: family wants pt to return to [facility] OT Goal Formulation: With family Time For Goal Achievement: 01/11/22 Potential to Achieve Goals: Fair   OT Frequency: Min 1X/week    Co-evaluation              AM-PAC OT "6 Clicks" Daily Activity     Outcome Measure Help from another person eating meals?: A  Lot Help from another person taking care of personal grooming?: A Lot Help from another person toileting, which includes using toliet, bedpan, or urinal?: Total Help from another person bathing (including washing, rinsing, drying)?: Total Help from another person to put on  and taking off regular upper body clothing?: A Lot Help from another person to put on and taking off regular lower body clothing?: Total 6 Click Score: 9   End of Session Equipment Utilized During Treatment: Gait belt Nurse Communication: Mobility status  Activity Tolerance: Other (comment);Treatment limited secondary to agitation (cognition) Patient left: in chair;with call bell/phone within reach;with nursing/sitter in room;with family/visitor present  OT Visit Diagnosis: Other abnormalities of gait and mobility (R26.89);Muscle weakness (generalized) (M62.81);Other symptoms and signs involving cognitive function                Time: 2919-1660 OT Time Calculation (min): 36 min Charges:  OT General Charges $OT Visit: 1 Visit OT Evaluation $OT Eval Moderate Complexity: 1 Mod  [facility] MS, OTR/L ascom [phone]  12/28/21, 2:19 PM

## 2021-12-28 NOTE — Progress Notes (Signed)
Initial Nutrition Assessment  DOCUMENTATION CODES:   Not applicable  INTERVENTION:   Continue Multivitamin w/ minerals daily Continue Ensure Enlive po TID, each supplement provides 350 kcal and 20 grams of protein. Encourage good PO intake  Feeding assist with all meals  NUTRITION DIAGNOSIS:   Increased nutrient needs related to acute illness as evidenced by estimated needs.  GOAL:   Patient will meet greater than or equal to 90% of their needs  MONITOR:   PO intake, Supplement acceptance, Labs, I & O's  REASON FOR ASSESSMENT:   Consult Assessment of nutrition requirement/status  ASSESSMENT:   88 y.o. female presented to the ED with fever and hypoxia. PMH includes pulmonary embolism, HTN, TIA, T2DM, HLD, Alzheimer's disease, dysphagia, and PVD. Pt admitted with sepsis secondary to UTI and aspiration pneumonia.   RD working remotely at time of assessment.  Only one meal intake recorded within chart. Pt may benefit from tray set up and feeding assistance. Per EMR, pt weight has appeared to remain stable.   Meal Intake  12/09: 20% x 1 meal  Medications reviewed and include: Vitamin B12, MVI, IV antibiotics Labs reviewed.   NUTRITION - FOCUSED PHYSICAL EXAM:  Deferred to follow-up.  Diet Order:   Diet Order             DIET - DYS 1 Room service appropriate? Yes with Assist; Fluid consistency: Thin  Diet effective now                   EDUCATION NEEDS:   No education needs have been identified at this time  Skin:  Skin Assessment: Reviewed RN Assessment  Last BM:  Unknown  Height:   Ht Readings from Last 1 Encounters:  12/26/21 '5\' 5"'$  (1.651 m)    Weight:   Wt Readings from Last 1 Encounters:  12/26/21 60.7 kg    Ideal Body Weight:  56.8 kg  BMI:  Body mass index is 22.27 kg/m.  Estimated Nutritional Needs:   Kcal:  1800-2000  Protein:  90-105 grams  Fluid:  >/= 1.8 L    Hermina Barters RD, LDN Clinical Dietitian See [facility]  for contact information.

## 2021-12-28 NOTE — Evaluation (Signed)
Physical Therapy Evaluation Patient Details Name: Krista Ellis MRN: [id number] DOB: 09/04/1930 Today's Date: 12/28/2021  History of Present Illness  Krista Ellis is a 88 y.o. female with medical history significant for CVA, history of peripheral vascular disease, history of pulmonary embolism, hypothyroidism, hypertension, vascular dementia, chronic respiratory failure on 3 L of oxygen as needed who resides at [facility] and was brought into the ER for evaluation of fever and hypoxia.  Clinical Impression  The pt presents this session is good spirits, she is very talkative and welcoming to PT. Per the patient's family she has required use of a hoyer to transfer x6 weeks. The pt is upright in bedside chair on arrival, finishing position from OT session. Pt is agreeable to transfer back to bed. Pt requires full assistance for set up for the transfer and assistance for forward lean for placement of gait belt and positioning for transfer. At this time the pt requires dependent assistance for chair<>bed transfer with several verbal cues for hand placement. Family was educated that at this stage transfers with RN may be safest with a hoyer lift to keep the patient and the staff safe. She will continue to be seen by PT and improvements with transfers without hoyer lift can be addressed. This patient is also a good candidate for bed level mobility with the mobility tech staff. PT will continue to follow.         Recommendations for follow up therapy are one component of a multi-disciplinary discharge planning process, led by the attending physician.  Recommendations may be updated based on patient status, additional functional criteria and insurance authorization.  Follow Up Recommendations Skilled nursing-short term rehab (<3 hours/day) Can patient physically be transported by private vehicle: No    Assistance Recommended at Discharge    Patient can return home with the following  Two people to  help with walking and/or transfers;Two people to help with bathing/dressing/bathroom;Direct supervision/assist for medications management;Help with stairs or ramp for entrance;Direct supervision/assist for financial management;Assist for transportation;Assistance with cooking/housework    Equipment Recommendations None recommended by PT  Recommendations for Other Services       Functional Status Assessment Patient has had a recent decline in their functional status and/or demonstrates limited ability to make significant improvements in function in a reasonable and predictable amount of time     Precautions / Restrictions Precautions Precautions: Fall Restrictions Weight Bearing Restrictions: No      Mobility  Bed Mobility Overal bed mobility: Needs Assistance Bed Mobility: Sit to Supine       Sit to supine: Total assist        Transfers Overall transfer level: Needs assistance   Transfers: Bed to chair/wheelchair/BSC       Squat pivot transfers: Total assist          Ambulation/Gait                  Stairs            Wheelchair Mobility    Modified Rankin (Stroke Patients Only)       Balance   Sitting-balance support: No upper extremity supported, Feet supported Sitting balance-Leahy Scale: Poor   Postural control: Posterior lean                                   Pertinent Vitals/Pain Pain Assessment Pain Assessment: No/denies pain    Home Living Family/patient  expects to be discharged to:: Skilled nursing facility Living Arrangements: Other (Comment)                 Additional Comments: Long term care at [facility]    Prior Function Prior Level of Function : Needs assist  Cognitive Assist : ADLs (cognitive);Mobility (cognitive) Mobility (Cognitive): Step by step cues ADLs (Cognitive): Step by step cues Physical Assist : Mobility (physical);ADLs (physical) Mobility (physical): Bed mobility;Transfers ADLs  (physical): Feeding;Grooming;Bathing;Dressing;Toileting;IADLs Mobility Comments: Per daughter, 6 weeks ago pt was able to complete stand pivot transfers to toilet, recliner, and manual w/c. Pt needs assist for w/c propulsion and staff transport her to dining room for meals. Starting 6 weeks ago, pt has been requiring hoyer lift transfers to manual w/c and recliner. ADLs Comments: Staff assist with all ADLs (completing at bed level for past couple months)     Hand Dominance   Dominant Hand: Left    Extremity/Trunk Assessment   Upper Extremity Assessment Upper Extremity Assessment: RUE deficits/detail;LUE deficits/detail RUE Deficits / Details: Rotator cuff tear, does not demonstrate flexion of shoulder LUE Deficits / Details: Rotator cuff tear, does not demonstrate flexion of shoulder.    Lower Extremity Assessment Lower Extremity Assessment: RLE deficits/detail;LLE deficits/detail RLE Deficits / Details: Grossly limited 2/5 LLE Deficits / Details: Grossly limited 2/5       Communication   Communication: HOH  Cognition Arousal/Alertness: Awake/alert Behavior During Therapy: WFL for tasks assessed/performed Overall Cognitive Status: History of cognitive impairments - at baseline                                          General Comments      Exercises     Assessment/Plan    PT Assessment Patient needs continued PT services  PT Problem List Decreased strength;Decreased range of motion;Decreased balance;Decreased activity tolerance;Decreased mobility       PT Treatment Interventions Functional mobility training;Patient/family education    PT Goals (Current goals can be found in the Care Plan section)  Acute Rehab PT Goals Patient Stated Goal: none stated; family has goal for patient to improve with transfers PT Goal Formulation: With family Time For Goal Achievement: 01/11/22 Potential to Achieve Goals: Fair    Frequency Min 2X/week      Co-evaluation               AM-PAC PT "6 Clicks" Mobility  Outcome Measure Help needed turning from your back to your side while in a flat bed without using bedrails?: A Lot Help needed moving from lying on your back to sitting on the side of a flat bed without using bedrails?: A Lot Help needed moving to and from a bed to a chair (including a wheelchair)?: Total Help needed standing up from a chair using your arms (e.g., wheelchair or bedside chair)?: Total Help needed to walk in hospital room?: Total Help needed climbing 3-5 steps with a railing? : Total 6 Click Score: 8    End of Session Equipment Utilized During Treatment: Gait belt Activity Tolerance: Patient tolerated treatment well Patient left: in bed;with bed alarm set;with family/visitor present;with call bell/phone within reach Nurse Communication: Mobility status;Precautions PT Visit Diagnosis: Difficulty in walking, not elsewhere classified (R26.2);Muscle weakness (generalized) (M62.81)    Time: 5035-4656 PT Time Calculation (min) (ACUTE ONLY): 34 min   Charges:   PT Evaluation $PT Eval Moderate Complexity: 1 Mod  PT Treatments $Therapeutic Activity: 23-37 mins        3:34 PM, 12/28/21 Alyissa Whidbee A. Saverio Danker PT, DPT Physical Therapist - [facility]   Mariena Meares A Iesha Summerhill 12/28/2021, 3:29 PM

## 2021-12-28 NOTE — Progress Notes (Signed)
[facility] Progress Note  Patient: Krista Ellis    RKY:[id number]  DOA: 12/26/2021     Date of Service: the patient was seen and examined on 12/28/2021  Chief Complaint  Patient presents with   Shortness of Breath   Brief hospital course: SOLVEIG FANGMAN is a 88 y.o. female with medical history significant for CVA, history of peripheral vascular disease, history of pulmonary embolism, hypothyroidism, hypertension, vascular dementia, chronic respiratory failure on 3 L of oxygen as needed who resides at [facility] and was brought into the ER for evaluation of fever and hypoxia. Patient has had poor oral intake over the last several days.  Patient has dysphagia and concern for possible aspiration pneumonia seen by PCP recently.  ED workup:  Tmax of 101.2 upon arrival to the ER, was said to be hypotensive and responded to IV fluid resuscitation.  She was hypoxic with room air pulse oximetry in the low 80s and is currently on her 3 L of oxygen by nasal cannula. Labs show a white count of 19,000 and pyuria Patient received a dose of cefepime, vancomycin and Flagyl. Patient was admitted under hospitalist service for further management as below.   Assessment and Plan:  * Sepsis secondary to UTI ([place]) Sepsis criteria fever, hypotension, respiratory failure with hypoxia, leukocytosis, pyuria, imaging shows pneumonia. Urine culture growing E. coli and Proteus Mirabilis, sensitive report is pending Continue IV fluid resuscitation Continue oxygen supplementation to maintain pulse oximetry greater than 92% Treat patient empirically with Zosyn adjusted to renal function Blood cultures NGTD    Aspiration pneumonia (HCC) Imaging shows multifocal nodules and peribronchovascular consolidations, involving all lobes, though most pronounced in the right lower lobe. Findings are most consistent with multifocal pneumonia. Supplemental O2 admission weaned off, currently on room air. Continue  above antibiotics Continue aspiration precautions, seen by SLP Started Mucinex 600 mg p.o. twice daily, Tussionex prn for cough  Hypotension due to sepsis Continue IV fluid for hydration Started midodrine 5 mg p.o. 3 times daily with holding parameters Monitor BP and titrate medication accordingly 12/9 blood pressure is fluctuating, very low last night and early morning, high in the afternoon.    Malnutrition of moderate degree (HCC) Related to known dementia and poor oral intake Will consult dietitian   Hypothyroidism Continue Synthroid   Hypertension Hold all antihypertensive medications for now   History of PE, continue Eliquis  Dysphagia, pharyngoesophageal phase-resolved as of 06/01/2013 Patient has a known history of dysphagia, pharyngeal esophageal phase with frequent choking episodes resulting in aspiration. speech therapy evaluation done  Dementia, continued Namenda   Body mass index is 22.27 kg/m.  Interventions:    Diet: Dysphagia 1 diet with thin liquids, continue aspiration precautions Follow SLP recommendation  DVT Prophylaxis: Therapeutic Anticoagulation with apixaban    Advance goals of care discussion: DNR  Family Communication: family was present at bedside, at the time of interview.  The pt provided permission to discuss medical plan with the family. Opportunity was given to ask question and all questions were answered satisfactorily.  12/8 Discussed with patient's daughter at bedside if her condition does not improve then patient may need palliative care consult for hospice placement.   Disposition:  Pt is from  [facility]-term [facility], admitted with sepsis, UTI and aspiration pneumonia, still has low blood pressure, on IV antibiotics, cultures pending, which precludes a safe discharge. Discharge to [facility]-term [facility], when stable may need 2-3 days   Subjective: No significant overnight events, as  per patient's daughter patient having  mild cough, blood pressure was low last night.  Patient denies any worsening of shortness of breath, no chest pain orthopnea, patient was lying comfortably.     Physical Exam: General:  alert, AAO x 1 oriented to herself only.Marland Kitchen  Appear in no acute distress, affect appropriate Eyes: PERRLA ENT: Oral Mucosa Clear, moist  Neck: no JVD,  Cardiovascular: S1 and S2 Present, no Murmur,  Respiratory: good respiratory effort, Bilateral Air entry equal and Decreased, mild bilateral crackles, no significant wheezes Abdomen: Bowel Sound present, Soft and no tenderness,  Skin: No rashes Extremities: No pedal edema, no calf tenderness Neurologic: without any new focal findings Gait not checked due to patient safety concerns  Vitals:   12/28/21 0146 12/28/21 0500 12/28/21 0758 12/28/21 1327  BP: (!) 85/45 (!) 154/73 (!) 91/44 (!) 170/119  Pulse: 82 84 73 77  Resp: '18 16 16 16  '$ Temp: 97.8 F (36.6 C) (!) 97.1 F (36.2 C) 97.8 F (36.6 C) 97.8 F (36.6 C)  TempSrc: Oral Oral Axillary   SpO2: 94% 93% 92% 93%  Weight:      Height:        Intake/Output Summary (Last 24 hours) at 12/28/2021 1402 Last data filed at 12/28/2021 1324 Gross per 24 hour  Intake 0 ml  Output 1400 ml  Net -1400 ml   Filed Weights   12/26/21 0508  Weight: 60.7 kg    Data Reviewed: I have personally reviewed and interpreted daily labs, tele strips, imagings as discussed above. I reviewed all nursing notes, pharmacy notes, vitals, pertinent old records I have discussed plan of care as described above with RN and patient/family.  CBC: Recent Labs  Lab 12/26/21 0519 12/27/21 0743  WBC 19.9* 17.7*  NEUTROABS 16.2*  --   HGB 12.6 11.3*  HCT 39.9 35.2*  MCV 96.6 95.4  PLT 343 035   Basic Metabolic Panel: Recent Labs  Lab 12/26/21 0519 12/27/21 0743  NA 135 137  K 4.3 4.2  CL 101 105  CO2 26 27  GLUCOSE 127* 107*  BUN 13 10  CREATININE 0.84 0.67  CALCIUM 9.7 9.2  MG  --  1.8  PHOS  --  2.7     Studies: No results found.  Scheduled Meds:  acetaminophen  650 mg Rectal Once   apixaban  2.5 mg Oral BID   cyanocobalamin  500 mcg Oral Daily   feeding supplement  237 mL Oral TID BM   levothyroxine  25 mcg Oral Q0600   memantine  10 mg Oral BID   midodrine  5 mg Oral TID WC   multivitamin with minerals  1 tablet Oral Daily   Continuous Infusions:  lactated ringers 125 mL/hr at 12/28/21 0623   piperacillin-tazobactam (ZOSYN)  IV 3.375 g (12/28/21 0539)   PRN Meds: acetaminophen **OR** acetaminophen, guaiFENesin-dextromethorphan, ondansetron **OR** ondansetron (ZOFRAN) IV, senna  Time spent: 35 minutes  Author: Val Riles. MD [facility] 12/28/2021 2:02 PM  To reach On-call, see care teams to locate the attending and reach out to them via [email or web address]. If 7PM-7AM, please contact night-coverage If you still have difficulty reaching the attending provider, please page the [facility] [place] (Director on Call) for [facility] on amion for assistance.

## 2021-12-29 DIAGNOSIS — N390 Urinary tract infection, site not specified: Secondary | ICD-10-CM | POA: Diagnosis not present

## 2021-12-29 DIAGNOSIS — A419 Sepsis, unspecified organism: Secondary | ICD-10-CM | POA: Diagnosis not present

## 2021-12-29 LAB — BASIC METABOLIC PANEL
Anion gap: 4 — ABNORMAL LOW (ref 5–15)
BUN: 6 mg/dL — ABNORMAL LOW (ref 8–23)
CO2: 28 mmol/L (ref 22–32)
Calcium: 9.2 mg/dL (ref 8.9–10.3)
Chloride: 106 mmol/L (ref 98–111)
Creatinine, Ser: 0.55 mg/dL (ref 0.44–1.00)
GFR, Estimated: >60 mL/min (ref >60)
Glucose, Bld: 100 mg/dL — ABNORMAL HIGH (ref 70–99)
Potassium: 3.8 mmol/L (ref 3.5–5.1)
Sodium: 138 mmol/L (ref 135–145)

## 2021-12-29 LAB — CBC
HCT: 36.2 % (ref 36.0–46.0)
Hemoglobin: 11.6 g/dL — ABNORMAL LOW (ref 12.0–15.0)
MCH: 30.9 pg (ref 26.0–34.0)
MCHC: 32 g/dL (ref 30.0–36.0)
MCV: 96.3 fL (ref 80.0–100.0)
Platelets: 337 10*3/uL (ref 150–400)
RBC: 3.76 MIL/uL — ABNORMAL LOW (ref 3.87–5.11)
RDW: 11.9 % (ref 11.5–15.5)
WBC: 8.4 10*3/uL (ref 4.0–10.5)
nRBC: 0.2 % (ref 0.0–0.2)

## 2021-12-29 LAB — URINE CULTURE: Culture: >=100000 — AB

## 2021-12-29 LAB — PHOSPHORUS: Phosphorus: 2 mg/dL — ABNORMAL LOW (ref 2.5–4.6)

## 2021-12-29 LAB — MAGNESIUM: Magnesium: 1.9 mg/dL (ref 1.7–2.4)

## 2021-12-29 MED ORDER — MELATONIN 5 MG PO TABS: 5.0000 mg | ORAL_TABLET | Freq: Every evening | ORAL | Status: DC | PRN

## 2021-12-29 MED ORDER — CEFUROXIME AXETIL 500 MG PO TABS
500.0000 mg | ORAL_TABLET | Freq: Two times a day (BID) | ORAL | Status: DC
  Administered 2021-12-30: 500 mg via ORAL
  Filled 2021-12-29 (×2): qty 1

## 2021-12-29 MED ORDER — POTASSIUM PHOSPHATES 15 MMOLE/5ML IV SOLN
30.0000 mmol | Freq: Once | INTRAVENOUS | Status: AC
  Administered 2021-12-29: 30 mmol via INTRAVENOUS
  Filled 2021-12-29: qty 10

## 2021-12-29 MED ORDER — NYSTATIN 100000 UNIT/ML MT SUSP
5.0000 mL | Freq: Four times a day (QID) | OROMUCOSAL | Status: DC
  Administered 2021-12-29 – 2021-12-30 (×3): 500000 [IU] via ORAL
  Filled 2021-12-29 (×2): qty 5

## 2021-12-29 NOTE — TOC Initial Note (Signed)
Transition of Care [facility]) - Initial/Assessment Note    Patient Details  Name: Krista Ellis MRN: [id number] Date of Birth: 1930-12-26  Transition of Care [facility]) CM/SW Contact:    [address], [place] Phone Number: 12/29/2021, 9:33 AM  Clinical Narrative:                  CSW spoke with pt's daughter/POA Ronald Pippins in reference to PT/OT recommendation for SNF at dc. Pt's daughter confirmed pt is from [facility] and that she would like pt to return their for SNF. CSW spoke with Seth Bake at [facility], she confirms pt can return with an FL2 for skilled.         Patient Goals and CMS Choice        Expected Discharge Plan and Services                                                Prior Living Arrangements/Services                       Activities of Daily Living Home Assistive Devices/Equipment: Wheelchair, Eyeglasses ADL Screening (condition at time of admission) Patient's cognitive ability adequate to safely complete daily activities?: Yes Is the patient deaf or have difficulty hearing?: No Does the patient have difficulty seeing, even when wearing glasses/contacts?: No Does the patient have difficulty concentrating, remembering, or making decisions?: No Patient able to express need for assistance with ADLs?: Yes Does the patient have difficulty dressing or bathing?: No Independently performs ADLs?: No Communication: Independent Dressing (OT): Needs assistance Is this a change from baseline?: Pre-admission baseline Grooming: Needs assistance Is this a change from baseline?: Pre-admission baseline Feeding: Needs assistance Is this a change from baseline?: Pre-admission baseline Bathing: Dependent Is this a change from baseline?: Pre-admission baseline Toileting: Dependent Is this a change from baseline?: Pre-admission baseline In/Out Bed: Dependent Is this a change from baseline?: Pre-admission baseline Walks in Home: Dependent Is this a  change from baseline?: Pre-admission baseline Does the patient have difficulty walking or climbing stairs?: Yes Weakness of Legs: Both Weakness of Arms/Hands: Both  Permission Sought/Granted                  Emotional Assessment              Admission diagnosis:  Acute cystitis without hematuria [N30.00] Sepsis secondary to UTI ([place]) [A41.9, N39.0] Sepsis without acute organ dysfunction, due to unspecified organism [facility] [A41.9] Patient Active Problem List   Diagnosis Date Noted   Sepsis secondary to UTI ([place]) 12/26/2021   Alzheimer's disease ([place]) 12/25/2021   Aspiration pneumonia ([place]) 08/14/2016   Aspiration pneumonia of both lower lobes due to gastric secretions ([place]) 05/15/2016   Chest pain 05/13/2016   Acid reflux 04/13/2015   Combined fat and carbohydrate induced hyperlipemia 04/13/2015   Atrophic vaginitis 03/01/2015   Microscopic hematuria 03/01/2015   Bladder spasms 03/01/2015   Pulmonary embolism ([place]) 09/22/2014   UTI (urinary tract infection) 09/14/2014   Enterococcus UTI 09/14/2014   Generalized weakness 09/14/2014   Sepsis ([place]) 09/10/2014   Open wound of knee, leg (except thigh), and ankle, complicated 82/95/6213   Buttock pain 07/19/2014   Hospital discharge follow-up 07/04/2014   Malnutrition of moderate degree ([place]) 05/21/2014   TIA (transient ischemic attack) 08/65/7846   Systolic murmur 96/29/5284  Gait disturbance 05/15/2014   Benign essential HTN 05/08/2014   History of subdural hematoma    Subdural hematoma (HCC) 05/05/2014   B12 deficiency 03/09/2014   Bradycardia 02/24/2014   Arteriosclerosis of coronary artery 02/23/2014   Recurrent UTI 01/03/2014   Bilateral hand numbness 01/03/2014   Nephrolithiasis 11/18/2013   Recurrent falls 11/18/2013   Closed rib fracture 09/25/2013   Abnormal CT scan, chest 09/01/2013   Chest wall pain 08/31/2013   Hematoma and contusion 08/31/2013   Atypical chest pain 06/17/2013   Peripheral vascular  disease ([place]) 06/17/2013   Heart valve disease 06/17/2013   Atherosclerotic peripheral vascular disease ([place]) 06/01/2013   Bladder wall hemorrhage 12/10/2012   Dysphagia, pharyngoesophageal phase 12/10/2012   Malaise and fatigue 12/10/2012   Frank hematuria 12/02/2012   Bladder retention 12/02/2012   Postmenopausal estrogen deficiency 11/30/2012   Glucose found in urine on examination 11/30/2012   Post menopausal syndrome 11/30/2012   Type 2 diabetes mellitus with hyperglycemia ([place]) 11/24/2012   Lung nodule, solitary 03/12/2012   Diverticular disease of large intestine 12/25/2011   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   Symptoms involving urinary system 12/04/2011   Medicare annual wellness visit, subsequent 10/01/2011   Dyslipidemia 10/01/2011   HLD (hyperlipidemia) 10/01/2011   Encounter for general adult medical examination without abnormal findings 10/01/2011   Bladder infection, chronic 09/29/2011   Incomplete bladder emptying 09/29/2011   Urge incontinence of urine 09/29/2011   Delayed onset of urination 09/29/2011   Hypertension 10/17/2010   Hypothyroidism 10/17/2010   Osteoarthritis 10/17/2010   Essential (primary) hypertension 10/17/2010   Arthritis, degenerative 10/17/2010   CN (constipation) 05/02/2010   PCP:  Dewayne Shorter, MD Pharmacy:   [place], [place] - [address] [place] [address] Phone: [phone] Fax: [facility], [place] - [address] [place] [address] Phone: [phone] Fax: [phone]     Social Determinants of Health (SDOH) Interventions    Readmission Risk Interventions     No data to display

## 2021-12-29 NOTE — NC FL2 (Signed)
[place] LEVEL OF CARE FORM     IDENTIFICATION  Patient Name: Krista Ellis Birthdate: 01-10-1931 Sex: female Admission Date (Current Location): 12/26/2021  [facility] and [place] Number:  Engineering geologist and Address:  [facility], [address] [place], [place] [address]      Provider Number: [id number]  Attending Physician Name and Address:  Val Riles, MD  Relative Name and Phone Number:  Blima Dessert [phone]    Current Level of Care: Hospital Recommended Level of Care: [place] Prior Approval Number:    Date Approved/Denied:   PASRR Number: [id number]  Discharge Plan: SNF    Current Diagnoses: Patient Active Problem List   Diagnosis Date Noted   Sepsis secondary to UTI ([place]) 12/26/2021   Alzheimer's disease ([place]) 12/25/2021   Aspiration pneumonia ([place]) 08/14/2016   Aspiration pneumonia of both lower lobes due to gastric secretions ([place]) 05/15/2016   Chest pain 05/13/2016   Acid reflux 04/13/2015   Combined fat and carbohydrate induced hyperlipemia 04/13/2015   Atrophic vaginitis 03/01/2015   Microscopic hematuria 03/01/2015   Bladder spasms 03/01/2015   Pulmonary embolism ([place]) 09/22/2014   UTI (urinary tract infection) 09/14/2014   Enterococcus UTI 09/14/2014   Generalized weakness 09/14/2014   Sepsis ([place]) 09/10/2014   Open wound of knee, leg (except thigh), and ankle, complicated 84/69/6295   Buttock pain 07/19/2014   Hospital discharge follow-up 07/04/2014   Malnutrition of moderate degree ([place]) 05/21/2014   TIA (transient ischemic attack) 28/41/3244   Systolic murmur 01/22/7251   Gait disturbance 05/15/2014   Benign essential HTN 05/08/2014   History of subdural hematoma    Subdural hematoma ([place]) 05/05/2014   B12 deficiency 03/09/2014   Bradycardia 02/24/2014   Arteriosclerosis of coronary artery 02/23/2014   Recurrent UTI 01/03/2014   Bilateral hand numbness 01/03/2014    Nephrolithiasis 11/18/2013   Recurrent falls 11/18/2013   Closed rib fracture 09/25/2013   Abnormal CT scan, chest 09/01/2013   Chest wall pain 08/31/2013   Hematoma and contusion 08/31/2013   Atypical chest pain 06/17/2013   Peripheral vascular disease ([place]) 06/17/2013   Heart valve disease 06/17/2013   Atherosclerotic peripheral vascular disease ([place]) 06/01/2013   Bladder wall hemorrhage 12/10/2012   Dysphagia, pharyngoesophageal phase 12/10/2012   Malaise and fatigue 12/10/2012   Frank hematuria 12/02/2012   Bladder retention 12/02/2012   Postmenopausal estrogen deficiency 11/30/2012   Glucose found in urine on examination 11/30/2012   Post menopausal syndrome 11/30/2012   Type 2 diabetes mellitus with hyperglycemia ([place]) 11/24/2012   Lung nodule, solitary 03/12/2012   Diverticular disease of large intestine 12/25/2011   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   Symptoms involving urinary system 12/04/2011   Medicare annual wellness visit, subsequent 10/01/2011   Dyslipidemia 10/01/2011   HLD (hyperlipidemia) 10/01/2011   Encounter for general adult medical examination without abnormal findings 10/01/2011   Bladder infection, chronic 09/29/2011   Incomplete bladder emptying 09/29/2011   Urge incontinence of urine 09/29/2011   Delayed onset of urination 09/29/2011   Hypertension 10/17/2010   Hypothyroidism 10/17/2010   Osteoarthritis 10/17/2010   Essential (primary) hypertension 10/17/2010   Arthritis, degenerative 10/17/2010   CN (constipation) 05/02/2010    Orientation RESPIRATION BLADDER Height & Weight        Normal Incontinent, External catheter Weight: 133 lb 12.8 oz (60.7 kg) Height:  '5\' 5"'$  (165.1 cm)  BEHAVIORAL SYMPTOMS/MOOD NEUROLOGICAL BOWEL NUTRITION STATUS      Continent Diet (see dc  summary)  AMBULATORY STATUS COMMUNICATION OF NEEDS Skin   Total Care Verbally Normal                       Personal Care Assistance Level of  Assistance  Bathing, Feeding, Dressing Bathing Assistance: Maximum assistance Feeding assistance: Limited assistance Dressing Assistance: Maximum assistance     Functional Limitations Info  Sight, Hearing, Speech Sight Info: Adequate Hearing Info: Adequate Speech Info: Adequate    SPECIAL CARE FACTORS FREQUENCY  PT (By licensed PT), OT (By licensed OT)     PT Frequency: 5x week OT Frequency: 5x week            Contractures Contractures Info: Not present    Additional Factors Info  Code Status, Allergies, Isolation Precautions Code Status Info: DNR Allergies Info: Levaquin (Levofloxacin)   Sulfa Antibiotics   Ciprofloxacin   Doxycycline   Ferrous Sulfate   Penicillins     Isolation Precautions Info: MRSA     Current Medications (12/29/2021):  This is the current hospital active medication list Current Facility-Administered Medications  Medication Dose Route Frequency Provider Last Rate Last Admin   acetaminophen (TYLENOL) suppository 650 mg  650 mg Rectal Once Blake Divine, MD       acetaminophen (TYLENOL) tablet 650 mg  650 mg Oral Q6H PRN Agbata, Tochukwu, MD   650 mg at 12/28/21 1610   Or   acetaminophen (TYLENOL) suppository 650 mg  650 mg Rectal Q6H PRN Agbata, Tochukwu, MD       apixaban (ELIQUIS) tablet 2.5 mg  2.5 mg Oral BID Agbata, Tochukwu, MD   2.5 mg at 12/29/21 0936   benzocaine (ORAJEL) 10 % mucosal gel   Mouth/Throat TID PRN Val Riles, MD       chlorpheniramine-HYDROcodone (TUSSIONEX) 10-8 MG/5ML suspension 5 mL  5 mL Oral Q12H PRN Val Riles, MD       cyanocobalamin (VITAMIN B12) tablet 500 mcg  500 mcg Oral Daily Agbata, Tochukwu, MD   500 mcg at 12/29/21 0936   feeding supplement (ENSURE ENLIVE / ENSURE PLUS) liquid 237 mL  237 mL Oral TID BM Agbata, Tochukwu, MD   237 mL at 12/29/21 0938   guaiFENesin (MUCINEX) 12 hr tablet 600 mg  600 mg Oral BID Val Riles, MD   600 mg at 12/29/21 9604   lactated ringers infusion   Intravenous  Continuous Agbata, Tochukwu, MD 125 mL/hr at 12/28/21 2344 New Bag at 12/28/21 2344   levothyroxine (SYNTHROID) tablet 25 mcg  25 mcg Oral Q0600 Agbata, Tochukwu, MD   25 mcg at 12/29/21 0606   melatonin tablet 5 mg  5 mg Oral QHS PRN Val Riles, MD       memantine [facility]) tablet 10 mg  10 mg Oral BID Agbata, Tochukwu, MD   10 mg at 12/29/21 0936   midodrine (PROAMATINE) tablet 5 mg  5 mg Oral TID WC Val Riles, MD   5 mg at 12/29/21 0936   multivitamin with minerals tablet 1 tablet  1 tablet Oral Daily Agbata, Tochukwu, MD   1 tablet at 12/29/21 0936   ondansetron (ZOFRAN) tablet 4 mg  4 mg Oral Q6H PRN Agbata, Tochukwu, MD       Or   ondansetron (ZOFRAN) injection 4 mg  4 mg Intravenous Q6H PRN Agbata, Tochukwu, MD       piperacillin-tazobactam (ZOSYN) IVPB 3.375 g  3.375 g Intravenous Q8H Agbata, Tochukwu, MD 12.5 mL/hr at 12/29/21 0559 3.375 g at 12/29/21  0559   potassium PHOSPHATE 30 mmol in dextrose 5 % 500 mL infusion  30 mmol Intravenous Once Val Riles, MD       senna (SENOKOT) tablet 17.2 mg  2 tablet Oral Daily PRN Agbata, Tochukwu, MD         Discharge Medications: Please see discharge summary for a list of discharge medications.  Relevant Imaging Results:  Relevant Lab Results:   Additional Information    Kasheena Sambrano B Yazleen Molock, LCSWA

## 2021-12-29 NOTE — Progress Notes (Signed)
[facility] Progress Note  Patient: Krista Ellis    DVV:[id number]  DOA: 12/26/2021     Date of Service: the patient was seen and examined on 12/29/2021  Chief Complaint  Patient presents with   Shortness of Breath   Brief hospital course: Krista Ellis is a 88 y.o. female with medical history significant for CVA, history of peripheral vascular disease, history of pulmonary embolism, hypothyroidism, hypertension, vascular dementia, chronic respiratory failure on 3 L of oxygen as needed who resides at [facility] and was brought into the ER for evaluation of fever and hypoxia. Patient has had poor oral intake over the last several days.  Patient has dysphagia and concern for possible aspiration pneumonia seen by PCP recently.  ED workup:  Tmax of 101.2 upon arrival to the ER, was said to be hypotensive and responded to IV fluid resuscitation.  She was hypoxic with room air pulse oximetry in the low 80s and is currently on her 3 L of oxygen by nasal cannula. Labs show a white count of 19,000 and pyuria Patient received a dose of cefepime, vancomycin and Flagyl. Patient was admitted under hospitalist service for further management as below.   Assessment and Plan:  * Sepsis secondary to UTI ([place]) Sepsis criteria fever, hypotension, respiratory failure with hypoxia, leukocytosis, pyuria, imaging shows pneumonia. Urine culture growing E. coli and Proteus Mirabilis, pansensitive Continue IV fluid resuscitation Continue oxygen supplementation to maintain pulse oximetry greater than 92% S/p Zosyn for 4 days, switch to Ceftin 500 mg p.o. twice daily for 3 days, total 7-day course of antibiotics.   Blood cultures NGTD    Aspiration pneumonia (HCC) Imaging shows multifocal nodules and peribronchovascular consolidations, involving all lobes, though most pronounced in the right lower lobe. Findings are most consistent with multifocal pneumonia. Supplemental O2 admission weaned off,  currently on room air. Continue above antibiotics Continue aspiration precautions, seen by SLP Started Mucinex 600 mg p.o. twice daily, Tussionex prn for cough  Hypotension due to sepsis Continue IV fluid for hydration Started midodrine 5 mg p.o. 3 times daily with holding parameters Monitor BP and titrate medication accordingly 12/9 blood pressure is fluctuating, very low last night and early morning, high in the afternoon.    Malnutrition of moderate degree (HCC) Related to known dementia and poor oral intake Will consult dietitian   Hypothyroidism Continue Synthroid   Hypertension Hold all antihypertensive medications for now   History of PE, continue Eliquis  Dysphagia, pharyngoesophageal phase-resolved as of 06/01/2013 Patient has a known history of dysphagia, pharyngeal esophageal phase with frequent choking episodes resulting in aspiration. speech therapy evaluation done  Dementia, continued Namenda Insomnia, started melatonin as needed  Body mass index is 22.27 kg/m.  Interventions:    Diet: Dysphagia 1 diet with thin liquids, continue aspiration precautions Follow SLP recommendation  DVT Prophylaxis: Therapeutic Anticoagulation with apixaban    Advance goals of care discussion: DNR  Family Communication: family was present at bedside, at the time of interview.  The pt provided permission to discuss medical plan with the family. Opportunity was given to ask question and all questions were answered satisfactorily.  12/8 Discussed with patient's daughter at bedside if her condition does not improve then patient may need palliative care consult for hospice placement.   Disposition:  Pt is from  [facility] long-term care, admitted with sepsis, UTI and aspiration pneumonia, still has low blood pressure, on oral midodrine, which precludes a safe discharge. Discharge to [place] [facility] long-term care, when  BP stable, most likely in 1 to 2 days    Subjective: No  significant overnight events, patient seems to be resting comfortably, denies any active issues.  Patient's daughter stated that she did not sleep well last night, has coughing spells sometimes but overall seems to be improving.  Patient is saturating well on room air. Discussed with patient's daughters at bedside regarding management plan, we will continue to monitor BP and possible discharge in 1 to 2 days if BP remains stable.   Physical Exam: General:  alert, AAO x 1 oriented to herself only.Marland Kitchen  Appear in no acute distress, affect appropriate Eyes: PERRLA ENT: Oral Mucosa Clear, moist  Neck: no JVD,  Cardiovascular: S1 and S2 Present, no Murmur,  Respiratory: good respiratory effort, Bilateral Air entry equal and Decreased, mild bilateral crackles, no significant wheezes Abdomen: Bowel Sound present, Soft and no tenderness,  Skin: No rashes Extremities: No pedal edema, no calf tenderness Neurologic: without any new focal findings Gait not checked due to patient safety concerns  Vitals:   12/28/21 2025 12/29/21 0001 12/29/21 0500 12/29/21 0747  BP: (!) 127/56 (!) 104/57 105/69 110/63  Pulse: (!) 107 86 84 84  Resp: '20 20 18 16  '$ Temp: 98.2 F (36.8 C) 98.9 F (37.2 C) 98.5 F (36.9 C) 98.6 F (37 C)  TempSrc: Oral Oral Oral   SpO2: 93% 91% 94% 92%  Weight:      Height:        Intake/Output Summary (Last 24 hours) at 12/29/2021 1130 Last data filed at 12/29/2021 0444 Gross per 24 hour  Intake 5991.5 ml  Output 2450 ml  Net 3541.5 ml   Filed Weights   12/26/21 0508  Weight: 60.7 kg    Data Reviewed: I have personally reviewed and interpreted daily labs, tele strips, imagings as discussed above. I reviewed all nursing notes, pharmacy notes, vitals, pertinent old records I have discussed plan of care as described above with RN and patient/family.  CBC: Recent Labs  Lab 12/26/21 0519 12/27/21 0743 12/29/21 0605  WBC 19.9* 17.7* 8.4  NEUTROABS 16.2*  --   --    HGB 12.6 11.3* 11.6*  HCT 39.9 35.2* 36.2  MCV 96.6 95.4 96.3  PLT 343 301 224   Basic Metabolic Panel: Recent Labs  Lab 12/26/21 0519 12/27/21 0743 12/29/21 0605  NA 135 137 138  K 4.3 4.2 3.8  CL 101 105 106  CO2 '26 27 28  '$ GLUCOSE 127* 107* 100*  BUN 13 10 6*  CREATININE 0.84 0.67 0.55  CALCIUM 9.7 9.2 9.2  MG  --  1.8 1.9  PHOS  --  2.7 2.0*    Studies: No results found.  Scheduled Meds:  acetaminophen  650 mg Rectal Once   apixaban  2.5 mg Oral BID   [START ON 12/30/2021] cefUROXime  500 mg Oral BID WC   cyanocobalamin  500 mcg Oral Daily   feeding supplement  237 mL Oral TID BM   guaiFENesin  600 mg Oral BID   levothyroxine  25 mcg Oral Q0600   memantine  10 mg Oral BID   midodrine  5 mg Oral TID WC   multivitamin with minerals  1 tablet Oral Daily   Continuous Infusions:  lactated ringers 50 mL/hr at 12/29/21 1126   piperacillin-tazobactam (ZOSYN)  IV 3.375 g (12/29/21 0559)   potassium PHOSPHATE IVPB (in mmol)     PRN Meds: acetaminophen **OR** acetaminophen, benzocaine, chlorpheniramine-HYDROcodone, melatonin, ondansetron **OR** ondansetron (ZOFRAN) IV, senna  Time spent: 35 minutes  Author: Val Riles. MD [facility] 12/29/2021 11:30 AM  To reach On-call, see care teams to locate the attending and reach out to them via [email or web address]. If 7PM-7AM, please contact night-coverage If you still have difficulty reaching the attending provider, please page the [facility] (Director on Call) for [facility] on amion for assistance.

## 2021-12-29 NOTE — Progress Notes (Signed)
       CROSS COVER NOTE  NAME: Krista Ellis MRN: [id number] DOB : 06-12-1930 ATTENDING PHYSICIAN: Val Riles, MD    Date of Service   12/29/2021   HPI/Events of Note   White spots,  Interventions   Assessment/Plan:  Candiadiasis, Thrush *** Nystatin swish and swallow X X      This document was prepared using Dragon voice recognition software and may include unintentional dictation errors.  Neomia Glass DNP, MBA, FNP-BC Nurse Practitioner [facility] Pager [phone]

## 2021-12-30 ENCOUNTER — Encounter: Payer: Self-pay | Admitting: Internal Medicine

## 2021-12-30 DIAGNOSIS — I2699 Other pulmonary embolism without acute cor pulmonale: Secondary | ICD-10-CM | POA: Diagnosis not present

## 2021-12-30 DIAGNOSIS — Z515 Encounter for palliative care: Secondary | ICD-10-CM | POA: Diagnosis not present

## 2021-12-30 DIAGNOSIS — E559 Vitamin D deficiency, unspecified: Secondary | ICD-10-CM | POA: Diagnosis not present

## 2021-12-30 DIAGNOSIS — I739 Peripheral vascular disease, unspecified: Secondary | ICD-10-CM | POA: Diagnosis not present

## 2021-12-30 DIAGNOSIS — Z8673 Personal history of transient ischemic attack (TIA), and cerebral infarction without residual deficits: Secondary | ICD-10-CM | POA: Diagnosis not present

## 2021-12-30 DIAGNOSIS — F0280 Dementia in other diseases classified elsewhere without behavioral disturbance: Secondary | ICD-10-CM | POA: Diagnosis not present

## 2021-12-30 DIAGNOSIS — Z7189 Other specified counseling: Secondary | ICD-10-CM | POA: Diagnosis not present

## 2021-12-30 DIAGNOSIS — A419 Sepsis, unspecified organism: Secondary | ICD-10-CM | POA: Diagnosis not present

## 2021-12-30 DIAGNOSIS — H1033 Unspecified acute conjunctivitis, bilateral: Secondary | ICD-10-CM | POA: Diagnosis not present

## 2021-12-30 DIAGNOSIS — I10 Essential (primary) hypertension: Secondary | ICD-10-CM | POA: Diagnosis not present

## 2021-12-30 DIAGNOSIS — R69 Illness, unspecified: Secondary | ICD-10-CM | POA: Diagnosis not present

## 2021-12-30 DIAGNOSIS — M1990 Unspecified osteoarthritis, unspecified site: Secondary | ICD-10-CM | POA: Diagnosis not present

## 2021-12-30 DIAGNOSIS — Z743 Need for continuous supervision: Secondary | ICD-10-CM | POA: Diagnosis not present

## 2021-12-30 DIAGNOSIS — Z86711 Personal history of pulmonary embolism: Secondary | ICD-10-CM | POA: Diagnosis not present

## 2021-12-30 DIAGNOSIS — R1314 Dysphagia, pharyngoesophageal phase: Secondary | ICD-10-CM | POA: Diagnosis not present

## 2021-12-30 DIAGNOSIS — K219 Gastro-esophageal reflux disease without esophagitis: Secondary | ICD-10-CM | POA: Diagnosis not present

## 2021-12-30 DIAGNOSIS — I252 Old myocardial infarction: Secondary | ICD-10-CM | POA: Diagnosis not present

## 2021-12-30 DIAGNOSIS — J690 Pneumonitis due to inhalation of food and vomit: Secondary | ICD-10-CM | POA: Diagnosis not present

## 2021-12-30 DIAGNOSIS — N390 Urinary tract infection, site not specified: Secondary | ICD-10-CM | POA: Diagnosis not present

## 2021-12-30 DIAGNOSIS — E1165 Type 2 diabetes mellitus with hyperglycemia: Secondary | ICD-10-CM | POA: Diagnosis not present

## 2021-12-30 DIAGNOSIS — G309 Alzheimer's disease, unspecified: Secondary | ICD-10-CM | POA: Diagnosis not present

## 2021-12-30 DIAGNOSIS — E440 Moderate protein-calorie malnutrition: Secondary | ICD-10-CM | POA: Diagnosis not present

## 2021-12-30 DIAGNOSIS — E039 Hypothyroidism, unspecified: Secondary | ICD-10-CM | POA: Diagnosis not present

## 2021-12-30 LAB — BASIC METABOLIC PANEL WITH GFR
Anion gap: 7 (ref 5–15)
BUN: 10 mg/dL (ref 8–23)
CO2: 27 mmol/L (ref 22–32)
Calcium: 9.3 mg/dL (ref 8.9–10.3)
Chloride: 106 mmol/L (ref 98–111)
Creatinine, Ser: 0.6 mg/dL (ref 0.44–1.00)
GFR, Estimated: >60 mL/min (ref >60)
Glucose, Bld: 105 mg/dL — ABNORMAL HIGH (ref 70–99)
Potassium: 4.1 mmol/L (ref 3.5–5.1)
Sodium: 140 mmol/L (ref 135–145)

## 2021-12-30 LAB — CBC
HCT: 37.2 % (ref 36.0–46.0)
Hemoglobin: 11.9 g/dL — ABNORMAL LOW (ref 12.0–15.0)
MCH: 30.7 pg (ref 26.0–34.0)
MCHC: 32 g/dL (ref 30.0–36.0)
MCV: 96.1 fL (ref 80.0–100.0)
Platelets: 380 K/uL (ref 150–400)
RBC: 3.87 MIL/uL (ref 3.87–5.11)
RDW: 12 % (ref 11.5–15.5)
WBC: 7.7 K/uL (ref 4.0–10.5)
nRBC: 0 % (ref 0.0–0.2)

## 2021-12-30 LAB — PHOSPHORUS: Phosphorus: 3.1 mg/dL (ref 2.5–4.6)

## 2021-12-30 LAB — MAGNESIUM: Magnesium: 2.1 mg/dL (ref 1.7–2.4)

## 2021-12-30 MED ORDER — MIDODRINE HCL 5 MG PO TABS: 5.0000 mg | ORAL_TABLET | Freq: Three times a day (TID) | ORAL | 0 refills | Status: DC

## 2021-12-30 MED ORDER — CEFUROXIME AXETIL 500 MG PO TABS: 500.0000 mg | ORAL_TABLET | Freq: Two times a day (BID) | ORAL | 0 refills | Status: AC

## 2021-12-30 MED ORDER — BENZOCAINE 10 % MT GEL: Freq: Three times a day (TID) | OROMUCOSAL | 0 refills | Status: AC | PRN

## 2021-12-30 MED ORDER — NYSTATIN 100000 UNIT/ML MT SUSP: 5.0000 mL | Freq: Four times a day (QID) | OROMUCOSAL | 0 refills | Status: AC

## 2021-12-30 MED ORDER — ENSURE ENLIVE PO LIQD: 237.0000 mL | Freq: Three times a day (TID) | ORAL | 12 refills | Status: AC

## 2021-12-30 MED ORDER — MELATONIN 5 MG PO TABS: 5.0000 mg | ORAL_TABLET | Freq: Every evening | ORAL | 0 refills | Status: AC | PRN

## 2021-12-30 NOTE — Progress Notes (Signed)
   12/30/21 1241  Medical Necessity for Transport Certificate --- IF THIS TRANSPORT IS ROUND TRIP OR SCHEDULED AND REPEATED, A PHYSICIAN MUST COMPLETE THIS FORM  Transport from: Teacher, English as a foreign language) [facility]  Transport to (Location) Other (Comment)  Did the patient arrive from a [place], [place] or Group Home? Yes  Care Facility Name [facility]  Is this the closest appropriate facility? Yes  Date of Transport Service 12/30/21  Name of [facility]  Round Trip Transport? No  Reason for Transport Discharge  Is this a hospice patient? Yes  Is this transport related to the patient's terminal illness? Yes  Please descirbe sepsis secondary to UTI  Describe the Medical Condition sepsis, aspiration pneumonia, hypertension, hypothyroidism, malnutrition of moderate degree  Q1 Are ALL the following "true"? 1. Patient unable to get up from bed without assistance  AND  2. Unable to ambulate  AND  3. Unable to sit in a chair, including wheelchair. Yes  Q2 Could the patient be transported safely by other means of transportation (I.E., wheelchair van)? No  Q3 Please check any of the following conditions that apply at the time of transport: Risk of injury to self and/or others;Other (Comment) (sepsis,)  Electronic Signature Colen Darling  Credentials DP  Date Signed 12/30/21

## 2021-12-30 NOTE — Plan of Care (Signed)

## 2021-12-30 NOTE — Progress Notes (Signed)
[facility]) Hospital Liaison note:  Notified by Clide Dales of request for [facility] services. Will continue to follow for disposition.  Please call with any outpatient palliative questions or concerns.  Thank you for the opportunity to participate in this patient's care.  Thank you, Lorelee Market, LPN [facility] Liaison [phone]

## 2021-12-30 NOTE — TOC Transition Note (Signed)
Transition of Care [facility]) - CM/SW Discharge Note   Patient Details  Name: Krista Ellis MRN: [id number] Date of Birth: 02/13/1930  [facility]) CM/SW Contact:  Colen Darling, [place] Phone Number: 12/30/2021, 12:36 PM   Clinical Narrative:     RN is calling report to [facility]. TOC sent discharge summary and informed Blima Dessert of the discharge. TOC calling [facility] for transport.  Final next level of care: [place] Barriers to Discharge: Barriers Resolved   Patient Goals and CMS Choice      [place] [facility] with [place]  Discharge Placement PASRR number recieved: 12/29/21 Existing PASRR number confirmed : 12/30/21          Patient chooses bed at: [facility] Patient to be transferred to facility by: [place] Name of family member notified: Blima Dessert [phone] Patient and family notified of of transfer: 12/30/21  Discharge Plan and [place] [facility]                  Social Determinants of Health (SDOH) Interventions     Readmission Risk Interventions     No data to display

## 2021-12-30 NOTE — Care Management Important Message (Signed)
Important Message  Patient Details  Name: Krista Ellis MRN: [id number] Date of Birth: [birth year]   Medicare Important Message Given:  Yes  I reviewed the Important Message from Medicare with the patient's HCPOA, Ms. Blima Dessert, daughter by phone [phone]). She said that she and her sisters were in agreement with the discharge plan. I wished a good afternoon and thanked her for time.     [name] 12/30/2021, 12:09 PM

## 2021-12-30 NOTE — Discharge Summary (Signed)
[facility] Discharge Summary   Patient: Krista Ellis EYC:[id number]  PCP: Dewayne Shorter, MD  Date of admission: 12/26/2021   Date of discharge:  12/30/2021     Discharge Diagnoses:  Principal Problem:   Sepsis secondary to UTI [facility]) Active Problems:   Aspiration pneumonia ([place])   Hypertension   Hypothyroidism   Malnutrition of moderate degree ([place])   Admitted From: SNF Disposition:  SNF   Recommendations for Outpatient Follow-up:  PCP: inn 1-2 days Follow up LABS/TEST:  monitor BP   Contact information for after-discharge care     Destination     [facility] .   Service: Skilled Nursing Contact information: [employer] [place] [employer] [phone]                    Diet recommendation: Dysphagia 1 diet, continue aspiration precautions  Activity: The patient is advised to gradually reintroduce usual activities, as tolerated  Discharge Condition: stable  Code Status: DNR   History of present illness: As per the H and P dictated on admission  Hospital Course:  Krista Ellis is a 88 y.o. female with medical history significant for CVA, history of peripheral vascular disease, history of pulmonary embolism, hypothyroidism, hypertension, vascular dementia, chronic respiratory failure on 3 L of oxygen as needed who resides at [facility] and was brought into the ER for evaluation of fever and hypoxia. Patient has had poor oral intake over the last several days.  Patient has dysphagia and concern for possible aspiration pneumonia seen by PCP recently. ED workup:  Tmax of 101.2 upon arrival to the ER, was said to be hypotensive and responded to IV fluid resuscitation.  She was hypoxic with room air pulse oximetry in the low 80s and is currently on her 3 L of oxygen by nasal cannula. Labs show a white count of 19,000 and pyuria Patient received a dose of cefepime, vancomycin and Flagyl. Patient was admitted  under hospitalist service for further management as below.    Assessment and Plan:   # Sepsis secondary to UTI  Sepsis criteria fever, hypotension, respiratory failure with hypoxia, leukocytosis, pyuria, imaging shows pneumonia. Urine culture growing E. coli and Proteus Mirabilis, pansensitive S/p IV fluid given for resuscitation.  Supplemental O2 nation has been weaned off, currently saturating well on room air. s/p Zosyn for 4 days.yesterday switch to Ceftin 500 mg p.o. twice daily for 3 days, total 7-day course of antibiotics.   Blood cultures NGTD.  # Aspiration pneumonia  Imaging shows multifocal nodules and peribronchovascular consolidations, involving all lobes, though most pronounced in the right lower lobe. Findings are most consistent with multifocal pneumonia. Supplemental O2 admission weaned off, currently on room air. Continue above antibiotics. Continue aspiration precautions, seen by SLP, continue dysphagia 1 diet. S/p Mucinex 600 mg p.o. twice daily, Tussionex prn for cough.  May continue symptomatic treatment. # Hypotension due to sepsis, s/p IV fluid for hydration, continue oral hydration. Started midodrine 5 mg p.o. 3 times daily with holding parameters. Monitor BP and titrate medication accordingly # Malnutrition of moderate degree, Related to known dementia and poor oral intak, continue Ensure supplement. # Hypothyroidism, Continue Synthroid # History of PE, continue Eliquis # Dysphagia, pharyngoesophageal phase-resolved as of 06/01/2013 Patient has a known history of dysphagia, pharyngeal esophageal phase with frequent choking episodes resulting in aspiration. speech therapy evaluation done # Dementia, continued Namenda # Insomnia, started melatonin as needed  # Mouth sores, can use Orajel  and nystatin swish and swallow as needed. Body mass index is 22.27 kg/m.  Nutrition Problem: Increased nutrient needs Etiology: acute illness Nutrition Interventions: Interventions:  Ensure Enlive (each supplement provides 350kcal and 20 grams of protein), MVI   Patient was seen by physical therapy, who recommended SNF, which was arranged. On the day of the discharge the patient's vitals were stable, and no other acute medical condition were reported by patient. the patient was felt safe to be discharge at [facility].  Overall patient's condition remained stable, medically optimized and cleared for discharge to SNF.  Patient remains at high risk for readmission secondary to above comorbidities and advanced age. Patient will benefit from palliative care follow-up and if condition deteriorates then she may qualify for hospice in future.  Patient's family is aware.   Consultants: Speech and swallow, palliative care Procedures: None  Discharge Exam: General: Appear in no distress, no Rash; Oral Mucosa Clear, moist. Cardiovascular: S1 and S2 Present, no Murmur, Respiratory: normal respiratory effort, Bilateral Air entry present and no Crackles, no wheezes Abdomen: Bowel Sound present, Soft and no tenderness, no hernia Extremities: no Pedal edema, no calf tenderness Neurology: alert and oriented to herself only. affect appropriate.  Filed Weights   12/26/21 0508  Weight: 60.7 kg   Vitals:   12/30/21 0736 12/30/21 0751  BP: (!) 60/43 137/62  Pulse: 81 81  Resp: 16   Temp: 98.5 F (36.9 C)   SpO2: 91%     DISCHARGE MEDICATION: Allergies as of 12/30/2021       Reactions   Levaquin [levofloxacin]    Sulfa Antibiotics Hives, Itching   Ciprofloxacin Hives, Itching   Doxycycline Hives, Itching   Ferrous Sulfate Hives, Itching   Penicillins Hives, Itching   .Has patient had a PCN reaction causing immediate rash, facial/tongue/throat swelling, SOB or lightheadedness with hypotension: Unknown Has patient had a PCN reaction causing severe rash involving mucus membranes or skin necrosis: Unknown Has patient had a PCN reaction that required hospitalization: Unknown Has  patient had a PCN reaction occurring within the last 10 years: Unknown If all of the above answers are "NO", then may proceed with Cephalosporin use.        Medication List     TAKE these medications    apixaban 2.5 MG Tabs tablet Commonly known as: ELIQUIS Take 2.5 mg by mouth 2 (two) times daily.   ARTHRITIS PAIN PO Take 650 mg by mouth 3 (three) times daily.   benzocaine 10 % mucosal gel Commonly known as: ORAJEL Use as directed in the mouth or throat 3 (three) times daily as needed for mouth pain.   cefUROXime 500 MG tablet Commonly known as: CEFTIN Take 1 tablet (500 mg total) by mouth 2 (two) times daily with a meal for 2 days.   cetirizine 10 MG tablet Commonly known as: ZYRTEC Take 10 mg by mouth daily.   cyanocobalamin 500 MCG tablet Commonly known as: VITAMIN B12 Take 500 mcg by mouth daily.   dextromethorphan-guaiFENesin 10-100 MG/5ML liquid Commonly known as: ROBITUSSIN-DM Take 10 mLs by mouth every 4 (four) hours as needed for cough.   feeding supplement Liqd Take 237 mLs by mouth 3 (three) times daily between meals.   hydrocortisone cream 1 % Apply 1 Application topically 2 (two) times daily as needed for itching.   levothyroxine 25 MCG tablet Commonly known as: SYNTHROID TAKE 1 TABLET (25 MCG TOTAL) BY MOUTH DAILY.   melatonin 5 MG Tabs Take 1 tablet (5 mg total) by  mouth at bedtime as needed.   memantine 10 MG tablet Commonly known as: NAMENDA Take 10 mg by mouth every 12 (twelve) hours as needed.   midodrine 5 MG tablet Commonly known as: PROAMATINE Take 1 tablet (5 mg total) by mouth 3 (three) times daily with meals for 10 days. Skip the dose if SBP >110 and/or heart rate >100   nystatin 100000 UNIT/ML suspension Commonly known as: MYCOSTATIN Take 5 mLs (500,000 Units total) by mouth 4 (four) times daily for 7 days.   nystatin powder Generic drug: nystatin Apply 1 Application topically 2 (two) times daily as needed.    phenazopyridine 100 MG tablet Commonly known as: PYRIDIUM Take 100 mg by mouth 2 (two) times daily as needed for pain.   senna 8.6 MG Tabs tablet Commonly known as: SENOKOT Take 2 tablets by mouth daily.   Vitamin D3 1.25 MG (50000 UT) Caps Take 50,000 Units by mouth every 30 (thirty) days. Patient takes on the 14th       Allergies  Allergen Reactions   Levaquin [Levofloxacin]    Sulfa Antibiotics Hives and Itching   Ciprofloxacin Hives and Itching   Doxycycline Hives and Itching   Ferrous Sulfate Hives and Itching   Penicillins Hives and Itching    .Has patient had a PCN reaction causing immediate rash, facial/tongue/throat swelling, SOB or lightheadedness with hypotension: Unknown Has patient had a PCN reaction causing severe rash involving mucus membranes or skin necrosis: Unknown Has patient had a PCN reaction that required hospitalization: Unknown Has patient had a PCN reaction occurring within the last 10 years: Unknown If all of the above answers are "NO", then may proceed with Cephalosporin use.    Discharge Instructions     Call MD for:  difficulty breathing, headache or visual disturbances   Complete by: As directed    Call MD for:  severe uncontrolled pain   Complete by: As directed    Call MD for:  temperature >100.4   Complete by: As directed    Diet - low sodium heart healthy   Complete by: As directed    Discharge instructions   Complete by: As directed    Follow-up with PCP, patient should be seen by an MD in 1 to 2 days.  Continue to monitor BP and titrate dose of midodrine. Patient will benefit from palliative follow-up, if condition deteriorates then she may qualify for hospice in future.  Patient's family is aware.   Increase activity slowly   Complete by: As directed        The results of significant diagnostics from this hospitalization (including imaging, microbiology, ancillary and laboratory) are listed below for reference.    Significant  Diagnostic Studies: CT Chest Wo Contrast  Result Date: 12/26/2021 CLINICAL DATA:  Shortness of breath EXAM: CT CHEST WITHOUT CONTRAST TECHNIQUE: Multidetector CT imaging of the chest was performed following the standard protocol without IV contrast. RADIATION DOSE REDUCTION: This exam was performed according to the departmental dose-optimization program which includes automated exposure control, adjustment of the mA and/or kV according to patient size and/or use of iterative reconstruction technique. COMPARISON:  CTA chest dated 01/25/2016 FINDINGS: Cardiovascular: Normal heart size. No significant pericardial fluid/thickening. Aortic atherosclerosis. Aortic valvular and mitral annular calcifications. Coronary artery calcifications. Great vessels are normal in course and caliber. Mediastinum/Nodes: Thyroid gland without nodules meeting criteria for imaging follow-up by size. Normal esophagus. Subcarinal lymph node measures 1.3 cm (2:60), likely reactive. Lungs/Pleura: The central airways are patent. Layering secretions within  the trachea extending to the left main bronchus. Diffuse bronchial wall thickening and bilateral lower lobe subsegmental mucous plugging. Multifocal nodules and peribronchovascular consolidations, involving all lobes, though most pronounced in the right lower lobe. No pneumothorax. No pleural effusion. Upper abdomen: Cholelithiasis. Musculoskeletal: No acute or abnormal lytic or blastic osseous lesions. IMPRESSION: 1. Multifocal nodules and peribronchovascular consolidations, involving all lobes, though most pronounced in the right lower lobe. Findings are most consistent with multifocal pneumonia. Recommend follow-up chest CT in 3 months after treatment to ensure resolution. 2. Subcarinal lymphadenopathy, likely reactive. 3. Cholelithiasis. 4. Coronary artery calcifications. Aortic valvular and mitral annular calcifications. Aortic Atherosclerosis (ICD10-I70.0). Electronically Signed   By:  Darrin Nipper M.D.   On: 12/26/2021 09:14   CT Abdomen Pelvis W Contrast  Result Date: 12/26/2021 CLINICAL DATA:  Acute generalized abdominal pain. EXAM: CT ABDOMEN AND PELVIS WITH CONTRAST TECHNIQUE: Multidetector CT imaging of the abdomen and pelvis was performed using the standard protocol following bolus administration of intravenous contrast. RADIATION DOSE REDUCTION: This exam was performed according to the departmental dose-optimization program which includes automated exposure control, adjustment of the mA and/or kV according to patient size and/or use of iterative reconstruction technique. CONTRAST:  145m OMNIPAQUE IOHEXOL 300 MG/ML  SOLN COMPARISON:  February 26, 2015. FINDINGS: Lower chest: Mild right basilar subsegmental atelectasis or infiltrate is noted. Multiple nodular opacities are noted in both lungs concerning for metastatic disease or multifocal infection. Hepatobiliary: No focal liver abnormality is seen. No gallstones, gallbladder wall thickening, or biliary dilatation. Pancreas: Unremarkable. No pancreatic ductal dilatation or surrounding inflammatory changes. Spleen: Normal in size without focal abnormality. Adrenals/Urinary Tract: Adrenal glands appear normal. Multiple small probable bilateral renal cysts are noted. No hydronephrosis or renal obstruction is noted. Urinary bladder is unremarkable. Stomach/Bowel: The stomach appears normal. Sigmoid diverticulosis is noted without inflammation. There is no evidence of bowel obstruction. Status post appendectomy. Vascular/Lymphatic: Aortic atherosclerosis. No enlarged abdominal or pelvic lymph nodes. Reproductive: Status post hysterectomy. No adnexal masses. Other: No abdominal wall hernia or abnormality. No abdominopelvic ascites. Musculoskeletal: Status post right total hip arthroplasty. Postsurgical changes are seen involving the lumbar spine. No acute osseous abnormality is noted. IMPRESSION: Mild right basilar opacity is noted concerning  for atelectasis or infiltrate. Multiple nodular opacities are noted in the visualized portion of the lung bases, right greater than left, concerning for metastatic disease or possibly multifocal inflammation. CT scan of the chest is recommended for further evaluation. Sigmoid diverticulosis without inflammation. Aortic Atherosclerosis (ICD10-I70.0). Electronically Signed   By: JMarijo ConceptionM.D.   On: 12/26/2021 08:00   DG Chest Portable 1 View  Result Date: 12/26/2021 CLINICAL DATA:  88year old female with shortness of breath, fever, decreased p.o., hypoxia. EXAM: PORTABLE CHEST 1 VIEW COMPARISON:  Chest radiographs 04/16/2017 and earlier. FINDINGS: Portable AP semi upright view at 0527 hours. Mildly lower lung volumes. Mediastinal contours remain normal. Mild for age, coarse bilateral pulmonary interstitial opacity appears to be chronic and stable since 2019. Otherwise Allowing for portable technique the lungs are clear. No pneumothorax or pleural effusion. No acute osseous abnormality identified. Negative visible bowel gas pattern. IMPRESSION: No acute cardiopulmonary abnormality. Electronically Signed   By: HGenevie AnnM.D.   On: 12/26/2021 06:01    Microbiology: Recent Results (from the past 240 hour(s))  Culture, blood (Routine x 2)     Status: None (Preliminary result)   Collection Time: 12/26/21  5:19 AM   Specimen: BLOOD  Result Value Ref Range Status   Specimen  Description BLOOD LEFT AC  Final   Special Requests   Final    BOTTLES DRAWN AEROBIC AND ANAEROBIC Blood Culture results may not be optimal due to an excessive volume of blood received in culture bottles   Culture   Final    NO GROWTH 4 DAYS Performed at [facility], [address] [place], [place] [address]    Report Status PENDING  Incomplete  Resp Panel by RT-PCR (Flu A&B, Covid) Anterior Nasal Swab     Status: None   Collection Time: 12/26/21  5:35 AM   Specimen: Anterior Nasal Swab  Result Value Ref Range  Status   SARS Coronavirus 2 by RT PCR NEGATIVE NEGATIVE Final    Comment: (NOTE) SARS-CoV-2 target nucleic acids are NOT DETECTED.  The SARS-CoV-2 RNA is generally detectable in upper respiratory specimens during the acute phase of infection. The lowest concentration of SARS-CoV-2 viral copies this assay can detect is 138 copies/mL. A negative result does not preclude SARS-Cov-2 infection and should not be used as the sole basis for treatment or other patient management decisions. A negative result may occur with  improper specimen collection/handling, submission of specimen other than nasopharyngeal swab, presence of viral mutation(s) within the areas targeted by this assay, and inadequate number of viral copies(<138 copies/mL). A negative result must be combined with clinical observations, patient history, and epidemiological information. The expected result is Negative.  Fact Sheet for Patients:  EntrepreneurPulse.com.au  Fact Sheet for Healthcare Providers:  IncredibleEmployment.be  This test is no t yet approved or cleared by the [place] FDA and  has been authorized for detection and/or diagnosis of SARS-CoV-2 by FDA under an Emergency Use Authorization (EUA). This EUA will remain  in effect (meaning this test can be used) for the duration of the COVID-19 declaration under Section 564(b)(1) of the Act, 21 U.S.C.section 360bbb-3(b)(1), unless the authorization is terminated  or revoked sooner.       Influenza A by PCR NEGATIVE NEGATIVE Final   Influenza B by PCR NEGATIVE NEGATIVE Final    Comment: (NOTE) The Xpert Xpress SARS-CoV-2/FLU/RSV plus assay is intended as an aid in the diagnosis of influenza from Nasopharyngeal swab specimens and should not be used as a sole basis for treatment. Nasal washings and aspirates are unacceptable for Xpert Xpress SARS-CoV-2/FLU/RSV testing.  Fact Sheet for  Patients: EntrepreneurPulse.com.au  Fact Sheet for Healthcare Providers: IncredibleEmployment.be  This test is not yet approved or cleared by the [place] FDA and has been authorized for detection and/or diagnosis of SARS-CoV-2 by FDA under an Emergency Use Authorization (EUA). This EUA will remain in effect (meaning this test can be used) for the duration of the COVID-19 declaration under Section 564(b)(1) of the Act, 21 U.S.C. section 360bbb-3(b)(1), unless the authorization is terminated or revoked.  Performed at [facility], [address], [place], [place] [address]   Urine Culture     Status: Abnormal   Collection Time: 12/26/21  6:14 AM   Specimen: Urine, Clean Catch  Result Value Ref Range Status   Specimen Description   Final    URINE, CLEAN CATCH Performed at [facility], [address], [place], [place] [address]    Special Requests   Final    NONE Performed at [facility], [address] [place], [place] [address]    Culture (A)  Final    >=100,000 COLONIES/mL ESCHERICHIA COLI 80,000 COLONIES/mL PROTEUS MIRABILIS    Report Status 12/29/2021 FINAL  Final  Organism ID, Bacteria ESCHERICHIA COLI (A)  Final   Organism ID, Bacteria PROTEUS MIRABILIS (A)  Final      Susceptibility   Escherichia coli - MIC*    AMPICILLIN >=32 RESISTANT Resistant     CEFAZOLIN <=4 SENSITIVE Sensitive     CEFEPIME <=0.12 SENSITIVE Sensitive     CEFTRIAXONE <=0.25 SENSITIVE Sensitive     CIPROFLOXACIN <=0.25 SENSITIVE Sensitive     GENTAMICIN <=1 SENSITIVE Sensitive     IMIPENEM <=0.25 SENSITIVE Sensitive     NITROFURANTOIN <=16 SENSITIVE Sensitive     TRIMETH/SULFA <=20 SENSITIVE Sensitive     AMPICILLIN/SULBACTAM 16 INTERMEDIATE Intermediate     PIP/TAZO <=4 SENSITIVE Sensitive     * >=100,000 COLONIES/mL ESCHERICHIA COLI   Proteus mirabilis - MIC*    AMPICILLIN <=2 SENSITIVE Sensitive     CEFAZOLIN <=4  SENSITIVE Sensitive     CEFEPIME <=0.12 SENSITIVE Sensitive     CEFTRIAXONE <=0.25 SENSITIVE Sensitive     CIPROFLOXACIN <=0.25 SENSITIVE Sensitive     GENTAMICIN <=1 SENSITIVE Sensitive     IMIPENEM 1 SENSITIVE Sensitive     NITROFURANTOIN 128 RESISTANT Resistant     TRIMETH/SULFA <=20 SENSITIVE Sensitive     AMPICILLIN/SULBACTAM <=2 SENSITIVE Sensitive     PIP/TAZO <=4 SENSITIVE Sensitive     * 80,000 COLONIES/mL PROTEUS MIRABILIS  Culture, blood (Routine x 2)     Status: None (Preliminary result)   Collection Time: 12/26/21  6:15 AM   Specimen: BLOOD RIGHT ARM  Result Value Ref Range Status   Specimen Description BLOOD RIGHT ARM  Final   Special Requests   Final    BOTTLES DRAWN AEROBIC AND ANAEROBIC Blood Culture results may not be optimal due to an excessive volume of blood received in culture bottles   Culture   Final    NO GROWTH 4 DAYS Performed at [facility], [address] [place], [place] [address]    Report Status PENDING  Incomplete  MRSA Next Gen by PCR, Nasal     Status: None   Collection Time: 12/26/21 11:05 AM   Specimen: Nasal Mucosa; Nasal Swab  Result Value Ref Range Status   MRSA by PCR Next Gen NOT DETECTED NOT DETECTED Final    Comment: (NOTE) The GeneXpert MRSA Assay (FDA approved for NASAL specimens only), is one component of a comprehensive MRSA colonization surveillance program. It is not intended to diagnose MRSA infection nor to guide or monitor treatment for MRSA infections. Test performance is not FDA approved in patients less than 88 years old. Performed at [facility], [address] [place], [place] [address]      Labs: CBC: Recent Labs  Lab 12/26/21 [phone] 12/27/21 0743 12/29/21 0605 12/30/21 0510  WBC 19.9* 17.7* 8.4 7.7  NEUTROABS 16.2*  --   --   --   HGB 12.6 11.3* 11.6* 11.9*  HCT 39.9 35.2* 36.2 37.2  MCV 96.6 95.4 96.3 96.1  PLT 343 301 337 973   Basic Metabolic Panel: Recent Labs  Lab  12/26/21 0519 12/27/21 0743 12/29/21 0605 12/30/21 0510  NA 135 137 138 140  K 4.3 4.2 3.8 4.1  CL 101 105 106 106  CO2 '26 27 28 27  '$ GLUCOSE 127* 107* 100* 105*  BUN 13 10 6* 10  CREATININE 0.84 0.67 0.55 0.60  CALCIUM 9.7 9.2 9.2 9.3  MG  --  1.8 1.9 2.1  PHOS  --  2.7 2.0* 3.1   Liver Function Tests: Recent Labs  Lab 12/26/21 0519  AST 19  ALT 10  ALKPHOS 54  BILITOT 0.7  PROT 6.9  ALBUMIN 3.2*   No results for input(s): "LIPASE", "AMYLASE" in the last 168 hours. No results for input(s): "AMMONIA" in the last 168 hours. Cardiac Enzymes: No results for input(s): "CKTOTAL", "CKMB", "CKMBINDEX", "TROPONINI" in the last 168 hours. BNP (last 3 results) No results for input(s): "BNP" in the last 8760 hours. CBG: No results for input(s): "GLUCAP" in the last 168 hours.  Time spent: 35 minutes  Signed:  Val Riles  [facility]  12/30/2021 11:44 AM

## 2021-12-30 NOTE — Consult Note (Signed)
Consultation Note Date: 12/30/2021   Patient Name: Krista Ellis  DOB: 1930/08/29  MRN: [id number]  Age / Sex: 88 y.o., female  PCP: Dewayne Shorter, MD Referring Physician: Val Riles, MD  Reason for Consultation: Establishing goals of care  HPI/Patient Profile: 88 y.o. female  with past medical history of CVA, vascular dementia, PVD, history of PE, hypothyroid, chronic respiratory failure with 3 L as needed, resident at [facility] under long-term care for 7 years, admitted on 12/26/2021 with sepsis secondary to UTI, aspiration pneumonia.   Clinical Assessment and Goals of Care: I have reviewed medical records including EPIC notes, labs and imaging, received report from RN, assessed the patient.  Mrs. Krista, Ellis, is sitting up in bed.  She appears chronically ill and elderly.  She will make an somewhat keep eye contact with a pleasant look on her face.  She has known dementia, but is calm and cooperative.  Her daughter, [name], is present at bedside.  Daughters [name] and Ronald Pippins and I meet in the conference room with daughter [name] on the telephone.  We meet to discuss diagnosis prognosis, GOC, EOL wishes, disposition and options.  I introduced Palliative Medicine as specialized medical care for people living with serious illness. It focuses on providing relief from the symptoms and stress of a serious illness. The goal is to improve quality of life for both the patient and the family.  We discussed a brief life review of the patient.  Krista Ellis became a widow at age 28.  She was a homemaker and a caregiver for many years.  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  She has been a resident of [place] under long-term care for 7 years.  She is in essence a bed/chair bound.  Family  is very involved in her care.  We then focused on their current illness.  Overall, daughters are very knowledgeable about Krista Ellis's acute and chronic health concerns.  We talked about UTI and the treatment plan.  Family shares that Mrs. Mcmiller had frequent recurrent UTIs before being admitted to [facility].  We talked about the likelihood that she will continue to have declines.  We talk about aspiration and diet choices.  Gerry's daughter is a Astronomer.  The natural disease trajectory and expectations at EOL were discussed.  Family states that Krista Ellis told them the last week that she saw Jesus.  Family shares, "she is ready".  We talked about CODE STATUS, DNR.  Family shares that Krista Ellis has talked about her end-of-life choices throughout her life, she would never accept artificial feeding/PEG tube.  They shared that she has even completed her funeral arrangements.  We talked about the concept of do not rehospitalize/"let nature take its course".  I given example of when this is appropriate.  Family shares that they are appreciative of  this conversation, and have a lot to think about/discuss.  We talked about how Krista Ellis would be cared for if she is not rehospitalized when sick again.  Hospice and Palliative Care services outpatient were explained and offered.  We talked about the benefits of outpatient palliative/hospice care.  Family is open to outpatient palliative services/hospice services when appropriate.  Provider choice offered.  They request [facility], in-house rep notified.  Discussed the importance of continued conversation with family and the medical providers regarding overall plan of care and treatment options, ensuring decisions are within the context of the patient's values and GOCs.  Questions and concerns were addressed. The family was encouraged to call with questions or concerns.  PMT will continue to support holistically.  Conference with attending, bedside nursing staff,  [facility] inpatient rep, transition care team related to patient condition, needs, goals of care, disposition.   HCPOA HCPOA -Mrs. Offer has legal healthcare power of attorney paperwork and her [facility] tab of epic chart.  She has named all 3 of her daughters as her healthcare surrogates to either work independently or as a team.    SUMMARY OF RECOMMENDATIONS   At this point continue to treat the treatable but no CPR or intubation Return to [facility] where she has been for the last 7 years. Outpatient palliative services with [facility] We discussed the concept of "do not rehospitalize/let nature take its course".  Code Status/Advance Care Planning: DNR -family states that Krista Ellis made her end-of-life choices many years ago.  Symptom Management:  Per hospitalist, no additional needs at this time.  Palliative Prophylaxis:  Frequent Pain Assessment, Oral Care, and Turn Reposition  Additional Recommendations (Limitations, Scope, Preferences): No Artificial Feeding and no CPR or intubation  Psycho-social/Spiritual:  Desire for further Chaplaincy support:no Additional Recommendations: Caregiving  Support/Resources and Education on Hospice  Prognosis:  < 6 months, would not be surprising based on chronic illness burden, bedbound status, septic UTI.  Discharge Planning: Return to [place] with outpatient palliative services with [facility].      Primary Diagnoses: Present on Admission:  Sepsis secondary to UTI ([place])  Hypertension  Hypothyroidism  (Resolved) Dysphagia, pharyngoesophageal phase  Aspiration pneumonia ([place])  Malnutrition of moderate degree ([place])   I have reviewed the medical record, interviewed the patient and family, and examined the patient. The following aspects are pertinent.  Past Medical History:  Diagnosis Date   Arthritis    Bleeding disorder ([place])    Chronic cystitis    Collagen vascular disease ([place])    CVA (cerebral infarction) 2003   Dysuria    Gross  hematuria    Hematuria    Hemihypertrophy    History of kidney stones    Hyperlipidemia    Hypertension    Murmur, cardiac    Myocardial infarction [facility])    Peripheral vascular disease ([facility])    s/p stent right leg   Pulmonary nodule    stable on Chest CT March 2014, Follow[facility] at [facility]   Recurrent UTI    Sensory urge incontinence    Thyroid disease    Urinary frequency    Social History   Socioeconomic History   Marital status: Widowed    Spouse name: Not on file   Number of children: 3   Years of education: Not on file   Highest education level: Not on file  Occupational History   Not on file  Tobacco Use   Smoking status: Never   Smokeless tobacco: Never  Substance  and Sexual Activity   Alcohol use: No   Drug use: No   Sexual activity: Not on file  Other Topics Concern   Not on file  Social History Narrative   Lives at home with caregiver.   Caffeine use: Drink 1/2 cup coffee at breakfast   Social Determinants of Health   Financial Resource Strain: Not on file  Food Insecurity: No Food Insecurity (12/26/2021)   Hunger Vital Sign    Worried About Running Out of Food in the Last Year: Never true    Ran Out of Food in the Last Year: Never true  Transportation Needs: No Transportation Needs (12/26/2021)   PRAPARE - Hydrologist (Medical): No    Lack of Transportation (Non-Medical): No  Physical Activity: Not on file  Stress: Not on file  Social Connections: Not on file   Family History  Problem Relation Age of Onset   Hypertension Mother    Transient ischemic attack Mother    Kidney disease Neg Hx    Bladder Cancer Neg Hx    Prostate cancer Neg Hx    Scheduled Meds:  acetaminophen  650 mg Rectal Once   apixaban  2.5 mg Oral BID   cefUROXime  500 mg Oral BID WC   cyanocobalamin  500 mcg Oral Daily   feeding supplement  237 mL Oral TID BM   guaiFENesin  600 mg Oral BID   levothyroxine  25 mcg Oral Q0600   memantine  10 mg Oral  BID   midodrine  5 mg Oral TID WC   multivitamin with minerals  1 tablet Oral Daily   nystatin  5 mL Oral QID   Continuous Infusions:  lactated ringers Stopped (12/29/21 2322)   PRN Meds:.acetaminophen **OR** acetaminophen, benzocaine, chlorpheniramine-HYDROcodone, melatonin, ondansetron **OR** ondansetron (ZOFRAN) IV, senna Medications Prior to Admission:  Prior to Admission medications   Medication Sig Start Date End Date Taking? Authorizing Provider  Acetaminophen (ARTHRITIS PAIN PO) Take 650 mg by mouth 3 (three) times daily.   Yes [provider]  apixaban (ELIQUIS) 2.5 MG TABS tablet Take 2.5 mg by mouth 2 (two) times daily.   Yes [provider]  cetirizine (ZYRTEC) 10 MG tablet Take 10 mg by mouth daily.   Yes [provider]  Cholecalciferol (VITAMIN D3) 50000 units CAPS Take 50,000 Units by mouth every 30 (thirty) days. Patient takes on the 14th   Yes [provider]  cyanocobalamin (VITAMIN B12) 500 MCG tablet Take 500 mcg by mouth daily.   Yes [provider]  levothyroxine (SYNTHROID, LEVOTHROID) 25 MCG tablet TAKE 1 TABLET (25 MCG TOTAL) BY MOUTH DAILY. 07/04/14  Yes Jackolyn Confer, MD  memantine (NAMENDA) 10 MG tablet Take 10 mg by mouth every 12 (twelve) hours as needed.   Yes [provider]  senna (SENOKOT) 8.6 MG TABS tablet Take 2 tablets by mouth daily.   Yes [provider]  benzocaine (ORAJEL) 10 % mucosal gel Use as directed in the mouth or throat 3 (three) times daily as needed for mouth pain. 12/30/21   Val Riles, MD  cefUROXime (CEFTIN) 500 MG tablet Take 1 tablet (500 mg total) by mouth 2 (two) times daily with a meal for 2 days. 12/30/21 01/01/22  Val Riles, MD  dextromethorphan-guaiFENesin (ROBITUSSIN-DM) 10-100 MG/5ML liquid Take 10 mLs by mouth every 4 (four) hours as needed for cough.    [provider]  feeding supplement (ENSURE ENLIVE / ENSURE PLUS) LIQD  Take 237 mLs by mouth  3 (three) times daily between meals. 12/30/21   Val Riles, MD  hydrocortisone cream 1 % Apply 1 Application topically 2 (two) times daily as needed for itching.    [provider]  melatonin 5 MG TABS Take 1 tablet (5 mg total) by mouth at bedtime as needed. 12/30/21 01/29/22  Val Riles, MD  midodrine (PROAMATINE) 5 MG tablet Take 1 tablet (5 mg total) by mouth 3 (three) times daily with meals for 10 days. Skip the dose if SBP >110 and/or heart rate >100 12/30/21 01/09/22  Val Riles, MD  nystatin (MYCOSTATIN) 100000 UNIT/ML suspension Take 5 mLs (500,000 Units total) by mouth 4 (four) times daily for 7 days. 12/30/21 01/06/22  Val Riles, MD  nystatin powder Apply 1 Application topically 2 (two) times daily as needed.    [provider]  phenazopyridine (PYRIDIUM) 100 MG tablet Take 100 mg by mouth 2 (two) times daily as needed for pain.    [provider]   Allergies  Allergen Reactions   Levaquin [Levofloxacin]    Sulfa Antibiotics Hives and Itching   Ciprofloxacin Hives and Itching   Doxycycline Hives and Itching   Ferrous Sulfate Hives and Itching   Penicillins Hives and Itching    .Has patient had a PCN reaction causing immediate rash, facial/tongue/throat swelling, SOB or lightheadedness with hypotension: Unknown Has patient had a PCN reaction causing severe rash involving mucus membranes or skin necrosis: Unknown Has patient had a PCN reaction that required hospitalization: Unknown Has patient had a PCN reaction occurring within the last 10 years: Unknown If all of the above answers are "NO", then may proceed with Cephalosporin use.    Review of Systems  Unable to perform ROS: Dementia    Physical Exam Vitals and nursing note reviewed.  Constitutional:      General: She is not in acute distress.    Appearance: She is ill-appearing.  Pulmonary:     Effort: Pulmonary effort is normal.  Neurological:     Mental Status: She is alert.      Comments: Known dementia  Psychiatric:     Comments: Cooperative, not fearful     Vital Signs: BP 137/62 (BP Location: Right Arm) Comment: RN notified at 7:40 and RN reassessed  Pulse 81   Temp 98.5 F (36.9 C) (Oral)   Resp 16   Ht '5\' 5"'$  (1.651 m)   Wt 60.7 kg   SpO2 91%   BMI 22.27 kg/m  Pain Scale: 0-10   Pain Score: 0-No pain   SpO2: SpO2: 91 % O2 Device:SpO2: 91 % O2 Flow Rate: .O2 Flow Rate (L/min): 3 L/min  IO: Intake/output summary:  Intake/Output Summary (Last 24 hours) at 12/30/2021 1207 Last data filed at 12/30/2021 0715 Gross per 24 hour  Intake 3231.91 ml  Output 2450 ml  Net 781.91 ml    LBM: Last BM Date : 12/26/21 Baseline Weight: Weight: 60.7 kg Most recent weight: Weight: 60.7 kg     Palliative Assessment/Data:   Flowsheet Rows    Flowsheet Row Most Recent Value  Intake Tab   Referral Department Hospitalist  Unit at Time of Referral Cardiac/Telemetry Unit  Palliative Care Primary Diagnosis Sepsis/Infectious Disease  Date Notified 12/26/21  Palliative Care Type New Palliative care  Reason for referral Clarify Goals of Care  Date of Admission 12/26/21  Date first seen by Palliative Care 12/30/21  # of days Palliative referral response time 4 Day(s)  # of days  IP prior to Palliative referral 0  Clinical Assessment   Palliative Performance Scale Score 30%  Pain Max last 24 hours Not able to report  Pain Min Last 24 hours Not able to report  Dyspnea Max Last 24 Hours Not able to report  Dyspnea Min Last 24 hours Not able to report  Psychosocial & Spiritual Assessment   Palliative Care Outcomes        Time In: 0745 Time Out: 0900 Time Total: 75 minutes  Greater than 50%  of this time was spent counseling and coordinating care related to the above assessment and plan.  Signed by: Drue Novel, NP   Please contact Palliative Medicine Team phone at [phone] for questions and concerns.  For individual provider: See Shea Evans

## 2022-01-01 LAB — CULTURE, BLOOD (ROUTINE X 2)
Culture: NO GROWTH
Culture: NO GROWTH

## 2022-01-03 ENCOUNTER — Non-Acute Institutional Stay (SKILLED_NURSING_FACILITY): Payer: Medicare Other | Admitting: Student

## 2022-01-03 ENCOUNTER — Encounter: Payer: Self-pay | Admitting: Student

## 2022-01-03 DIAGNOSIS — K219 Gastro-esophageal reflux disease without esophagitis: Secondary | ICD-10-CM

## 2022-01-03 DIAGNOSIS — E1165 Type 2 diabetes mellitus with hyperglycemia: Secondary | ICD-10-CM

## 2022-01-03 DIAGNOSIS — I739 Peripheral vascular disease, unspecified: Secondary | ICD-10-CM

## 2022-01-03 DIAGNOSIS — E440 Moderate protein-calorie malnutrition: Secondary | ICD-10-CM | POA: Diagnosis not present

## 2022-01-03 DIAGNOSIS — J690 Pneumonitis due to inhalation of food and vomit: Secondary | ICD-10-CM

## 2022-01-03 DIAGNOSIS — I10 Essential (primary) hypertension: Secondary | ICD-10-CM | POA: Diagnosis not present

## 2022-01-03 DIAGNOSIS — G309 Alzheimer's disease, unspecified: Secondary | ICD-10-CM

## 2022-01-03 DIAGNOSIS — F0280 Dementia in other diseases classified elsewhere without behavioral disturbance: Secondary | ICD-10-CM | POA: Diagnosis not present

## 2022-01-03 DIAGNOSIS — N390 Urinary tract infection, site not specified: Secondary | ICD-10-CM | POA: Diagnosis not present

## 2022-01-03 DIAGNOSIS — R1314 Dysphagia, pharyngoesophageal phase: Secondary | ICD-10-CM | POA: Diagnosis not present

## 2022-01-03 DIAGNOSIS — E039 Hypothyroidism, unspecified: Secondary | ICD-10-CM | POA: Diagnosis not present

## 2022-01-03 DIAGNOSIS — I2699 Other pulmonary embolism without acute cor pulmonale: Secondary | ICD-10-CM

## 2022-01-03 NOTE — Progress Notes (Unsigned)
Provider:  Dr. Dewayne Shorter Location:  [facility] Room Number: [place] [address]A Place of Service:  SNF (31)  PCP: Dewayne Shorter, MD Patient Care Team: Dewayne Shorter, MD as PCP - General [facility])  Extended Emergency Contact Information Primary Emergency Contact: [facility] Address: [address]          [place], [place] [address] [name] of [place] Phone: [phone] Mobile Phone: [phone] Relation: Daughter Secondary Emergency Contact: Tawni Levy Address: [address]          [place], [place] [address] [name] of [place] Phone: [phone] Mobile Phone: [phone] Relation: Daughter  Code Status: DNR Goals of Care: Advanced Directive information    01/03/2022    8:18 AM  Advanced Directives  Does Patient Have a Medical Advance Directive? Yes  Type of Paramedic of [place];Out of facility DNR (pink MOST or yellow form)  Does patient want to make changes to medical advance directive? No - Patient declined  Copy of [place] in Chart? Yes - validated most recent copy scanned in chart (See row information)      Chief Complaint  Patient presents with   New Admit To SNF    New Admission.     HPI: Patient is a 88 y.o. female seen today for admission to [facility] after hospitalization. She is a long term care patient in this facility. She states her name is Krista Ellis. Cannot give her birthday, location, or time of year. She states her three daughters names - Beckey Rutter, and [name]. She states she is feeling better. She says "you look pretty," over and over instead of answering questions. Denies pain. She says she will be ready for breakfast "in a little while."  Nursing states patient is not eating well. She takes a few sips of Ensure and that is it. She hasn't eaten well for the last few months. Weight stable.   Called [name] (responsible party) - who says she was here Monday  until yesterday. Would like a family meeting. Will plan to meet 12/20 at 10AM with siblings to disucss her overall status.   Past Medical History:  Diagnosis Date   Arthritis    Bleeding disorder ([place])    Chronic cystitis    Collagen vascular disease ([place])    CVA (cerebral infarction) 2003   Dysuria    Gross hematuria    Hematuria    Hemihypertrophy    History of kidney stones    Hyperlipidemia    Hypertension    Murmur, cardiac    Myocardial infarction [facility])    Peripheral vascular disease (HCC)    s/p stent right leg   Pulmonary nodule    stable on Chest CT March 2014, Follow[facility]   Recurrent UTI    Sensory urge incontinence    Thyroid disease    Urinary frequency    Past Surgical History:  Procedure Laterality Date   ABDOMINAL HYSTERECTOMY  1982   APPENDECTOMY     CARDIAC SURGERY     2 failed stents in right leg   [place]  2007   replaced ball in right hip   left rotator cuff     repair   ruptured disc     SPINE SURGERY     steel rod in back   stent placement leg Right 2015   TONSILLECTOMY     TOTAL HIP ARTHROPLASTY     right  TOTAL KNEE ARTHROPLASTY Left     reports that she has never smoked. She has never used smokeless tobacco. She reports that she does not drink alcohol and does not use drugs. Social History   Socioeconomic History   Marital status: Widowed    Spouse name: Not on file   Number of children: 3   Years of education: Not on file   Highest education level: Not on file  Occupational History   Not on file  Tobacco Use   Smoking status: Never   Smokeless tobacco: Never  Substance and Sexual Activity   Alcohol use: No   Drug use: No   Sexual activity: Not on file  Other Topics Concern   Not on file  Social History Narrative   Lives at home with caregiver.   Caffeine use: Drink 1/2 cup coffee at breakfast   Social Determinants of Health   Financial Resource Strain: Not on file  Food Insecurity:  No Food Insecurity (12/26/2021)   Hunger Vital Sign    Worried About Running Out of Food in the Last Year: Never true    Ran Out of Food in the Last Year: Never true  Transportation Needs: No Transportation Needs (12/26/2021)   PRAPARE - Hydrologist (Medical): No    Lack of Transportation (Non-Medical): No  Physical Activity: Not on file  Stress: Not on file  Social Connections: Not on file  Intimate Partner Violence: Not At Risk (12/26/2021)   Humiliation, Afraid, Rape, and Kick questionnaire    Fear of Current or Ex-Partner: No    Emotionally Abused: No    Physically Abused: No    Sexually Abused: No    Functional Status Survey:    Family History  Problem Relation Age of Onset   Hypertension Mother    Transient ischemic attack Mother    Kidney disease Neg Hx    Bladder Cancer Neg Hx    Prostate cancer Neg Hx     Health Maintenance  Topic Date Due   DTaP/Tdap/Td (1 - Tdap) Never done   Zoster Vaccines- Shingrix (1 of 2) Never done   Medicare Annual Wellness (AWV)  06/02/2014   HEMOGLOBIN A1C  01/25/2022 (Originally 11/12/2016)   Pneumonia Vaccine 71+ Years old  Completed   INFLUENZA VACCINE  Completed   DEXA SCAN  Completed   HPV VACCINES  Aged Out   FOOT EXAM  Discontinued   OPHTHALMOLOGY EXAM  Discontinued   COVID-19 Vaccine  Discontinued    Allergies  Allergen Reactions   Levaquin [Levofloxacin]    Sulfa Antibiotics Hives and Itching   Ciprofloxacin Hives and Itching   Doxycycline Hives and Itching   Ferrous Sulfate Hives and Itching   Penicillins Hives and Itching    .Has patient had a PCN reaction causing immediate rash, facial/tongue/throat swelling, SOB or lightheadedness with hypotension: Unknown Has patient had a PCN reaction causing severe rash involving mucus membranes or skin necrosis: Unknown Has patient had a PCN reaction that required hospitalization: Unknown Has patient had a PCN reaction occurring within the last 10  years: Unknown If all of the above answers are "NO", then may proceed with Cephalosporin use.     Outpatient Encounter Medications as of 01/03/2022  Medication Sig   acetaminophen (TYLENOL) 325 MG tablet Take 650 mg by mouth 3 (three) times daily as needed.   apixaban (ELIQUIS) 2.5 MG TABS tablet Take 2.5 mg by mouth 2 (two) times daily.   benzocaine (ORAJEL) 10 %  mucosal gel Use as directed in the mouth or throat 3 (three) times daily as needed for mouth pain.   cetirizine (ZYRTEC) 10 MG tablet Take 10 mg by mouth daily.   Cholecalciferol (VITAMIN D3) 50000 units CAPS Take 50,000 Units by mouth every 30 (thirty) days. Patient takes on the 14th   cyanocobalamin (VITAMIN B12) 500 MCG tablet Take 500 mcg by mouth daily.   dextromethorphan-guaiFENesin (ROBITUSSIN-DM) 10-100 MG/5ML liquid Take 10 mLs by mouth every 4 (four) hours as needed for cough.   feeding supplement (ENSURE ENLIVE / ENSURE PLUS) LIQD Take 237 mLs by mouth 3 (three) times daily between meals.   hydrocortisone cream 1 % Apply 1 Application topically 2 (two) times daily as needed for itching.   levothyroxine (SYNTHROID, LEVOTHROID) 25 MCG tablet TAKE 1 TABLET (25 MCG TOTAL) BY MOUTH DAILY.   melatonin 5 MG TABS Take 1 tablet (5 mg total) by mouth at bedtime as needed.   memantine (NAMENDA) 10 MG tablet Take 10 mg by mouth every 12 (twelve) hours as needed.   nystatin (MYCOSTATIN) 100000 UNIT/ML suspension Take 5 mLs (500,000 Units total) by mouth 4 (four) times daily for 7 days.   nystatin powder Apply 1 Application topically 2 (two) times daily as needed.   phenazopyridine (PYRIDIUM) 100 MG tablet Take 100 mg by mouth 2 (two) times daily as needed for pain.   senna (SENOKOT) 8.6 MG TABS tablet Take 2 tablets by mouth daily.   [DISCONTINUED] Acetaminophen (ARTHRITIS PAIN PO) Take 650 mg by mouth 3 (three) times daily.   [DISCONTINUED] midodrine (PROAMATINE) 5 MG tablet Take 1 tablet (5 mg total) by mouth 3 (three) times daily  with meals for 10 days. Skip the dose if SBP >110 and/or heart rate >100   No facility-administered encounter medications on file as of 01/03/2022.    Review of Systems  Unable to perform ROS: Dementia    Vitals:   01/03/22 0809  BP: (!) 153/67  Pulse: 80  Resp: 18  Temp: 97.6 F (36.4 C)  SpO2: 93%  Weight: 133 lb 12.8 oz (60.7 kg)  Height: '5\' 5"'$  (1.651 m)   Body mass index is 22.27 kg/m. Physical Exam Constitutional:      Comments: Thin   Cardiovascular:     Rate and Rhythm: Normal rate.     Pulses: Normal pulses.  Pulmonary:     Effort: Pulmonary effort is normal.     Breath sounds: Normal breath sounds.  Abdominal:     General: Abdomen is flat. Bowel sounds are normal.     Palpations: Abdomen is soft.  Skin:    General: Skin is warm and dry.  Neurological:     Mental Status: She is alert. Mental status is at baseline. She is disoriented.     Labs reviewed: Basic Metabolic Panel: Recent Labs    12/27/21 0743 12/29/21 0605 12/30/21 0510  NA 137 138 140  K 4.2 3.8 4.1  CL 105 106 106  CO2 '27 28 27  '$ GLUCOSE 107* 100* 105*  BUN 10 6* 10  CREATININE 0.67 0.55 0.60  CALCIUM 9.2 9.2 9.3  MG 1.8 1.9 2.1  PHOS 2.7 2.0* 3.1   Liver Function Tests: Recent Labs    11/21/21 0000 12/26/21 0519  AST 12* 19  ALT 6* 10  ALKPHOS 41 54  BILITOT  --  0.7  PROT  --  6.9  ALBUMIN 3.5 3.2*   No results for input(s): "LIPASE", "AMYLASE" in the last 8760 hours.  No results for input(s): "AMMONIA" in the last 8760 hours. CBC: Recent Labs    12/26/21 0519 12/27/21 0743 12/29/21 0605 12/30/21 0510  WBC 19.9* 17.7* 8.4 7.7  NEUTROABS 16.2*  --   --   --   HGB 12.6 11.3* 11.6* 11.9*  HCT 39.9 35.2* 36.2 37.2  MCV 96.6 95.4 96.3 96.1  PLT 343 301 337 380   Cardiac Enzymes: No results for input(s): "CKTOTAL", "CKMB", "CKMBINDEX", "TROPONINI" in the last 8760 hours. BNP: Invalid input(s): "POCBNP" Lab Results  Component Value Date   HGBA1C 5.3 05/13/2016    Lab Results  Component Value Date   TSH 2.67 11/20/2021   Lab Results  Component Value Date   [id number] 010 11/21/2021   Lab Results  Component Value Date   FOLATE 11.6 12/27/2021   Lab Results  Component Value Date   IRON 14 (L) 12/27/2021   TIBC 144 (L) 12/27/2021   FERRITIN 38.7 05/15/2014    Imaging and Procedures obtained prior to SNF admission: CT Chest Wo Contrast  Result Date: 12/26/2021 CLINICAL DATA:  Shortness of breath EXAM: CT CHEST WITHOUT CONTRAST TECHNIQUE: Multidetector CT imaging of the chest was performed following the standard protocol without IV contrast. RADIATION DOSE REDUCTION: This exam was performed according to the departmental dose-optimization program which includes automated exposure control, adjustment of the mA and/or kV according to patient size and/or use of iterative reconstruction technique. COMPARISON:  CTA chest dated 01/25/2016 FINDINGS: Cardiovascular: Normal heart size. No significant pericardial fluid/thickening. Aortic atherosclerosis. Aortic valvular and mitral annular calcifications. Coronary artery calcifications. Great vessels are normal in course and caliber. Mediastinum/Nodes: Thyroid gland without nodules meeting criteria for imaging follow-up by size. Normal esophagus. Subcarinal lymph node measures 1.3 cm (2:60), likely reactive. Lungs/Pleura: The central airways are patent. Layering secretions within the trachea extending to the left main bronchus. Diffuse bronchial wall thickening and bilateral lower lobe subsegmental mucous plugging. Multifocal nodules and peribronchovascular consolidations, involving all lobes, though most pronounced in the right lower lobe. No pneumothorax. No pleural effusion. Upper abdomen: Cholelithiasis. Musculoskeletal: No acute or abnormal lytic or blastic osseous lesions. IMPRESSION: 1. Multifocal nodules and peribronchovascular consolidations, involving all lobes, though most pronounced in the right lower  lobe. Findings are most consistent with multifocal pneumonia. Recommend follow-up chest CT in 3 months after treatment to ensure resolution. 2. Subcarinal lymphadenopathy, likely reactive. 3. Cholelithiasis. 4. Coronary artery calcifications. Aortic valvular and mitral annular calcifications. Aortic Atherosclerosis (ICD10-I70.0). Electronically Signed   By: Darrin Nipper M.D.   On: 12/26/2021 09:14   CT Abdomen Pelvis W Contrast  Result Date: 12/26/2021 CLINICAL DATA:  Acute generalized abdominal pain. EXAM: CT ABDOMEN AND PELVIS WITH CONTRAST TECHNIQUE: Multidetector CT imaging of the abdomen and pelvis was performed using the standard protocol following bolus administration of intravenous contrast. RADIATION DOSE REDUCTION: This exam was performed according to the departmental dose-optimization program which includes automated exposure control, adjustment of the mA and/or kV according to patient size and/or use of iterative reconstruction technique. CONTRAST:  143m OMNIPAQUE IOHEXOL 300 MG/ML  SOLN COMPARISON:  February 26, 2015. FINDINGS: Lower chest: Mild right basilar subsegmental atelectasis or infiltrate is noted. Multiple nodular opacities are noted in both lungs concerning for metastatic disease or multifocal infection. Hepatobiliary: No focal liver abnormality is seen. No gallstones, gallbladder wall thickening, or biliary dilatation. Pancreas: Unremarkable. No pancreatic ductal dilatation or surrounding inflammatory changes. Spleen: Normal in size without focal abnormality. Adrenals/Urinary Tract: Adrenal glands appear normal. Multiple small probable bilateral renal cysts are  noted. No hydronephrosis or renal obstruction is noted. Urinary bladder is unremarkable. Stomach/Bowel: The stomach appears normal. Sigmoid diverticulosis is noted without inflammation. There is no evidence of bowel obstruction. Status post appendectomy. Vascular/Lymphatic: Aortic atherosclerosis. No enlarged abdominal or pelvic  lymph nodes. Reproductive: Status post hysterectomy. No adnexal masses. Other: No abdominal wall hernia or abnormality. No abdominopelvic ascites. Musculoskeletal: Status post right total hip arthroplasty. Postsurgical changes are seen involving the lumbar spine. No acute osseous abnormality is noted. IMPRESSION: Mild right basilar opacity is noted concerning for atelectasis or infiltrate. Multiple nodular opacities are noted in the visualized portion of the lung bases, right greater than left, concerning for metastatic disease or possibly multifocal inflammation. CT scan of the chest is recommended for further evaluation. Sigmoid diverticulosis without inflammation. Aortic Atherosclerosis (ICD10-I70.0). Electronically Signed   By: Marijo Conception M.D.   On: 12/26/2021 08:00   DG Chest Portable 1 View  Result Date: 12/26/2021 CLINICAL DATA:  88 year old female with shortness of breath, fever, decreased p.o., hypoxia. EXAM: PORTABLE CHEST 1 VIEW COMPARISON:  Chest radiographs 04/16/2017 and earlier. FINDINGS: Portable AP semi upright view at 0527 hours. Mildly lower lung volumes. Mediastinal contours remain normal. Mild for age, coarse bilateral pulmonary interstitial opacity appears to be chronic and stable since 2019. Otherwise Allowing for portable technique the lungs are clear. No pneumothorax or pleural effusion. No acute osseous abnormality identified. Negative visible bowel gas pattern. IMPRESSION: No acute cardiopulmonary abnormality. Electronically Signed   By: Genevie Ann M.D.   On: 12/26/2021 06:01    Assessment/Plan 1. Recurrent UTI Patient readmitted to LTC after hospitalization for UTI. Bcx no growth to date. Complete 7 day course of Ceftin 500 mg for 7 days.   2. Alzheimer's disease ([place]) Patient shows progression of dementia. Disoriented at baseline. Nonsensical conversation at baseline. Dependent in al ADLS and requires support for feeding. Weight has been stable for the last year. Continue  memantine. [place] conversation with daughters in 1 week given concern that infections are signs of progression of disease - aspiration pneumonia specifically since patient has long-standing history of UTIs before progression of dementia.   3. Type 2 diabetes mellitus with hyperglycemia, without long-term current use of insulin (HCC) Glucose within goal range at this time. Continue to monitor. No medications indicated at this time.  Glucose  Date/Time Value Ref Range Status  05/05/2014 06:02 PM 104 (H) mg/dL Final    Comment:    65-99 NOTE: New Reference Range  03/28/14    Glucose, Bld  Date/Time Value Ref Range Status  12/30/2021 05:10 AM 105 (H) 70 - 99 mg/dL Final    Comment:    Glucose reference range applies only to samples taken after fasting for at least 8 hours.  ]  4. Hypothyroidism, unspecified type Most recent labs wnl. Continue levothyroxine 25 mcg daily.  TSH  Date/Time Value Ref Range Status  11/20/2021 12:00 AM 2.67 0.41 - 5.90 Final  05/13/2016 08:23 AM 2.748 0.350 - 4.500 uIU/mL Final    Comment:    Performed by a 3rd Generation assay with a functional sensitivity of <=0.01 uIU/mL.  08/11/2014 02:28 PM 3.99 0.35 - 4.50 uIU/mL Final     5. Gastroesophageal reflux disease, unspecified whether esophagitis present Hx of GERD, no medications this time. Poor PO intake, can consider starting medication as patient is unable to communicate at this time.   6. Aspiration pneumonia of both lower lobes due to gastric secretions [facility]) Patient with hospitalization diagnosed with multifocal pneumonia. No  supplemental O2 at this time. Dysphagia 1 diet. Cucinex 600 mg po BID and prn tussionex.   7. Peripheral vascular disease ([place]) No symptoms at this time. Continue to monitor.   8. Benign essential HTN Patient previously hypotensive during hospitalization. No medications at this time, will consider resumption if persistently elevated.   9. Hx of recurrent PE Continue  apixiban. No signs of DVT or PE at this time.    Family/ staff Communication: Nursing, daughter Curt Bears. Plan for family meeting in 1 week.   Labs/tests ordered: CBC and BMP 1 week.

## 2022-01-06 ENCOUNTER — Encounter: Payer: Self-pay | Admitting: Student

## 2022-01-06 ENCOUNTER — Non-Acute Institutional Stay (SKILLED_NURSING_FACILITY): Payer: Medicare Other | Admitting: Student

## 2022-01-06 DIAGNOSIS — H1033 Unspecified acute conjunctivitis, bilateral: Secondary | ICD-10-CM | POA: Diagnosis not present

## 2022-01-06 NOTE — Progress Notes (Unsigned)
Location:  Other [facility].  Nursing Home Room Number: [facility] 411A Place of Service:  SNF [phone]) Provider:  Dr. Amada Kingfisher, MD  Patient Care Team: Dewayne Shorter, MD as PCP - General [facility])  Extended Emergency Contact Information Primary Emergency Contact: [facility] Address: [address]          [place], [place] [address] [name] of [place] Phone: [phone] Mobile Phone: [phone] Relation: Daughter Secondary Emergency Contact: Tawni Levy Address: [address]          [place], [place] [address] [name] of [place] Phone: [phone] Mobile Phone: [phone] Relation: Daughter  Code Status:  DNR Goals of care: Advanced Directive information    01/06/2022   10:45 AM  Advanced Directives  Does Patient Have a Medical Advance Directive? Yes  Type of Paramedic of [place];Out of facility DNR (pink MOST or yellow form)  Does patient want to make changes to medical advance directive? No - Patient declined  Copy of [place] in Chart? Yes - validated most recent copy scanned in chart (See row information)     Chief Complaint  Patient presents with   Acute Visit    Watering Eyes.     HPI:  Pt is a 88 y.o. female seen today for an acute visit for concern for watering eyes. Patient states she feels fine. She is smiling. She says, "you look pretty," over and over again. Patient does not endorse pain in the eyes.   Nursing note states patient's daughter have concern about eyes watering.    Past Medical History:  Diagnosis Date   Arthritis    Bleeding disorder ([place])    Chronic cystitis    Collagen vascular disease ([place])    CVA (cerebral infarction) 2003   Dysuria    Gross hematuria    Hematuria    Hemihypertrophy    History of kidney stones    Hyperlipidemia    Hypertension    Murmur, cardiac    Myocardial infarction [facility])    Peripheral vascular disease (HCC)     s/p stent right leg   Pulmonary nodule    stable on Chest CT March 2014, Followed at [facility]   Recurrent UTI    Sensory urge incontinence    Thyroid disease    Urinary frequency    Past Surgical History:  Procedure Laterality Date   ABDOMINAL HYSTERECTOMY  1982   APPENDECTOMY     CARDIAC SURGERY     2 failed stents in right leg   [name]  2007   replaced ball in right hip   left rotator cuff     repair   ruptured disc     SPINE SURGERY     steel rod in back   stent placement leg Right 2015   TONSILLECTOMY     TOTAL HIP ARTHROPLASTY     right   TOTAL KNEE ARTHROPLASTY Left     Allergies  Allergen Reactions   Levaquin [Levofloxacin]    Sulfa Antibiotics Hives and Itching   Ciprofloxacin Hives and Itching   Doxycycline Hives and Itching   Ferrous Sulfate Hives and Itching   Penicillins Hives and Itching    .Has patient had a PCN reaction causing immediate rash, facial/tongue/throat swelling, SOB or lightheadedness with hypotension: Unknown Has patient had a PCN reaction causing severe rash involving mucus membranes or skin necrosis: Unknown Has patient had a PCN reaction  that required hospitalization: Unknown Has patient had a PCN reaction occurring within the last 10 years: Unknown If all of the above answers are "NO", then may proceed with Cephalosporin use.     Outpatient Encounter Medications as of 01/06/2022  Medication Sig   acetaminophen (TYLENOL) 325 MG tablet Take 650 mg by mouth 3 (three) times daily as needed.   apixaban (ELIQUIS) 2.5 MG TABS tablet Take 2.5 mg by mouth 2 (two) times daily.   benzocaine (ORAJEL) 10 % mucosal gel Use as directed in the mouth or throat 3 (three) times daily as needed for mouth pain.   cetirizine (ZYRTEC) 10 MG tablet Take 10 mg by mouth daily.   Cholecalciferol (VITAMIN D3) 50000 units CAPS Take 50,000 Units by mouth every 30 (thirty) days. Patient takes on the 14th   cyanocobalamin (VITAMIN B12)  500 MCG tablet Take 500 mcg by mouth daily.   dextromethorphan-guaiFENesin (ROBITUSSIN-DM) 10-100 MG/5ML liquid Take 10 mLs by mouth every 4 (four) hours as needed for cough.   feeding supplement (ENSURE ENLIVE / ENSURE PLUS) LIQD Take 237 mLs by mouth 3 (three) times daily between meals.   hydrocortisone cream 1 % Apply 1 Application topically 2 (two) times daily as needed for itching.   hydroxypropyl methylcellulose / hypromellose (ISOPTO TEARS / GONIOVISC) 2.5 % ophthalmic solution Place 1 drop into both eyes 3 (three) times daily.   levothyroxine (SYNTHROID, LEVOTHROID) 25 MCG tablet TAKE 1 TABLET (25 MCG TOTAL) BY MOUTH DAILY.   melatonin 5 MG TABS Take 1 tablet (5 mg total) by mouth at bedtime as needed.   memantine (NAMENDA) 10 MG tablet Take 10 mg by mouth every 12 (twelve) hours as needed.   nystatin (MYCOSTATIN) 100000 UNIT/ML suspension Take 5 mLs (500,000 Units total) by mouth 4 (four) times daily for 7 days.   nystatin powder Apply 1 Application topically 2 (two) times daily as needed.   olopatadine (PATADAY) 0.1 % ophthalmic solution Place 1 drop into both eyes daily.   phenazopyridine (PYRIDIUM) 100 MG tablet Take 100 mg by mouth 2 (two) times daily as needed for pain.   senna (SENOKOT) 8.6 MG TABS tablet Take 2 tablets by mouth daily.   No facility-administered encounter medications on file as of 01/06/2022.    Review of Systems  Unable to perform ROS: Dementia    Immunization History  Administered Date(s) Administered   Influenza Split 10/26/2010, 01/26/2012, 09/20/2013   Influenza-Unspecified 11/30/2012, 11/05/2021   Moderna Sars-Covid-2 Vaccination 02/28/2019, 03/28/2019   Pneumococcal Conjugate-13 12/21/2007   Pneumococcal Polysaccharide-23 12/01/2007   Unspecified SARS-COV-2 Vaccination 12/02/2019, 06/08/2020, 10/12/2020   Pertinent  Health Maintenance Due  Topic Date Due   HEMOGLOBIN A1C  01/25/2022 (Originally 11/12/2016)   INFLUENZA VACCINE  Completed   DEXA  SCAN  Completed   FOOT EXAM  Discontinued   OPHTHALMOLOGY EXAM  Discontinued      12/28/2021    9:00 AM 12/28/2021   10:22 PM 12/29/2021   11:25 AM 12/29/2021   10:05 PM 12/30/2021    7:15 AM  Fall Risk  Patient Fall Risk Level High fall risk High fall risk High fall risk High fall risk High fall risk   Functional Status Survey:    Vitals:   01/06/22 1037  BP: 134/78  Pulse: (!) 54  Resp: 18  Temp: 97.7 F (36.5 C)  SpO2: 96%  Weight: 133 lb 12.8 oz (60.7 kg)  Height: '5\' 5"'$  (1.651 m)   Body mass index is 22.27 kg/m. Physical Exam Vitals  reviewed.  Eyes:     Comments: Bilateral eyes with injected sclera, right eye with drainage present  Neurological:     Mental Status: She is alert.     Labs reviewed: Recent Labs    12/27/21 0743 12/29/21 0605 12/30/21 0510  NA 137 138 140  K 4.2 3.8 4.1  CL 105 106 106  CO2 '27 28 27  '$ GLUCOSE 107* 100* 105*  BUN 10 6* 10  CREATININE 0.67 0.55 0.60  CALCIUM 9.2 9.2 9.3  MG 1.8 1.9 2.1  PHOS 2.7 2.0* 3.1   Recent Labs    11/21/21 0000 12/26/21 0519  AST 12* 19  ALT 6* 10  ALKPHOS 41 54  BILITOT  --  0.7  PROT  --  6.9  ALBUMIN 3.5 3.2*   Recent Labs    12/26/21 0519 12/27/21 0743 12/29/21 0605 12/30/21 0510  WBC 19.9* 17.7* 8.4 7.7  NEUTROABS 16.2*  --   --   --   HGB 12.6 11.3* 11.6* 11.9*  HCT 39.9 35.2* 36.2 37.2  MCV 96.6 95.4 96.3 96.1  PLT 343 301 337 380   Lab Results  Component Value Date   TSH 2.67 11/20/2021   Lab Results  Component Value Date   HGBA1C 5.3 05/13/2016   Lab Results  Component Value Date   CHOL 176 11/21/2021   HDL 37 11/21/2021   LDLCALC 114 11/21/2021   TRIG 142 11/21/2021   CHOLHDL 4 08/11/2014    Significant Diagnostic Results in last 30 days:  CT Chest Wo Contrast  Result Date: 12/26/2021 CLINICAL DATA:  Shortness of breath EXAM: CT CHEST WITHOUT CONTRAST TECHNIQUE: Multidetector CT imaging of the chest was performed following the standard protocol without IV  contrast. RADIATION DOSE REDUCTION: This exam was performed according to the departmental dose-optimization program which includes automated exposure control, adjustment of the mA and/or kV according to patient size and/or use of iterative reconstruction technique. COMPARISON:  CTA chest dated 01/25/2016 FINDINGS: Cardiovascular: Normal heart size. No significant pericardial fluid/thickening. Aortic atherosclerosis. Aortic valvular and mitral annular calcifications. Coronary artery calcifications. Great vessels are normal in course and caliber. Mediastinum/Nodes: Thyroid gland without nodules meeting criteria for imaging follow-up by size. Normal esophagus. Subcarinal lymph node measures 1.3 cm (2:60), likely reactive. Lungs/Pleura: The central airways are patent. Layering secretions within the trachea extending to the left main bronchus. Diffuse bronchial wall thickening and bilateral lower lobe subsegmental mucous plugging. Multifocal nodules and peribronchovascular consolidations, involving all lobes, though most pronounced in the right lower lobe. No pneumothorax. No pleural effusion. Upper abdomen: Cholelithiasis. Musculoskeletal: No acute or abnormal lytic or blastic osseous lesions. IMPRESSION: 1. Multifocal nodules and peribronchovascular consolidations, involving all lobes, though most pronounced in the right lower lobe. Findings are most consistent with multifocal pneumonia. Recommend follow-up chest CT in 3 months after treatment to ensure resolution. 2. Subcarinal lymphadenopathy, likely reactive. 3. Cholelithiasis. 4. Coronary artery calcifications. Aortic valvular and mitral annular calcifications. Aortic Atherosclerosis (ICD10-I70.0). Electronically Signed   By: Darrin Nipper M.D.   On: 12/26/2021 09:14   CT Abdomen Pelvis W Contrast  Result Date: 12/26/2021 CLINICAL DATA:  Acute generalized abdominal pain. EXAM: CT ABDOMEN AND PELVIS WITH CONTRAST TECHNIQUE: Multidetector CT imaging of the abdomen  and pelvis was performed using the standard protocol following bolus administration of intravenous contrast. RADIATION DOSE REDUCTION: This exam was performed according to the departmental dose-optimization program which includes automated exposure control, adjustment of the mA and/or kV according to patient size and/or use of  iterative reconstruction technique. CONTRAST:  164m OMNIPAQUE IOHEXOL 300 MG/ML  SOLN COMPARISON:  February 26, 2015. FINDINGS: Lower chest: Mild right basilar subsegmental atelectasis or infiltrate is noted. Multiple nodular opacities are noted in both lungs concerning for metastatic disease or multifocal infection. Hepatobiliary: No focal liver abnormality is seen. No gallstones, gallbladder wall thickening, or biliary dilatation. Pancreas: Unremarkable. No pancreatic ductal dilatation or surrounding inflammatory changes. Spleen: Normal in size without focal abnormality. Adrenals/Urinary Tract: Adrenal glands appear normal. Multiple small probable bilateral renal cysts are noted. No hydronephrosis or renal obstruction is noted. Urinary bladder is unremarkable. Stomach/Bowel: The stomach appears normal. Sigmoid diverticulosis is noted without inflammation. There is no evidence of bowel obstruction. Status post appendectomy. Vascular/Lymphatic: Aortic atherosclerosis. No enlarged abdominal or pelvic lymph nodes. Reproductive: Status post hysterectomy. No adnexal masses. Other: No abdominal wall hernia or abnormality. No abdominopelvic ascites. Musculoskeletal: Status post right total hip arthroplasty. Postsurgical changes are seen involving the lumbar spine. No acute osseous abnormality is noted. IMPRESSION: Mild right basilar opacity is noted concerning for atelectasis or infiltrate. Multiple nodular opacities are noted in the visualized portion of the lung bases, right greater than left, concerning for metastatic disease or possibly multifocal inflammation. CT scan of the chest is  recommended for further evaluation. Sigmoid diverticulosis without inflammation. Aortic Atherosclerosis (ICD10-I70.0). Electronically Signed   By: JMarijo ConceptionM.D.   On: 12/26/2021 08:00   DG Chest Portable 1 View  Result Date: 12/26/2021 CLINICAL DATA:  88year old female with shortness of breath, fever, decreased p.o., hypoxia. EXAM: PORTABLE CHEST 1 VIEW COMPARISON:  Chest radiographs 04/16/2017 and earlier. FINDINGS: Portable AP semi upright view at 0527 hours. Mildly lower lung volumes. Mediastinal contours remain normal. Mild for age, coarse bilateral pulmonary interstitial opacity appears to be chronic and stable since 2019. Otherwise Allowing for portable technique the lungs are clear. No pneumothorax or pleural effusion. No acute osseous abnormality identified. Negative visible bowel gas pattern. IMPRESSION: No acute cardiopulmonary abnormality. Electronically Signed   By: HGenevie AnnM.D.   On: 12/26/2021 06:01    Assessment/Plan 1. Acute conjunctivitis of both eyes, unspecified acute conjunctivitis type Patient with conjunctivitis at this time. Unclear if allergic vs viral vs bacterial in nature. Plan to schedule artificial tears bilaterally q4 hrs for 7 days. Pataday daily bilaterally for 7 days. F/u prn.    Family/ staff Communication: daughter - plan to have GRamonameeting soon.   Labs/tests ordered:  none

## 2022-01-08 ENCOUNTER — Non-Acute Institutional Stay (SKILLED_NURSING_FACILITY): Payer: Medicare Other | Admitting: Student

## 2022-01-08 DIAGNOSIS — F0280 Dementia in other diseases classified elsewhere without behavioral disturbance: Secondary | ICD-10-CM

## 2022-01-08 DIAGNOSIS — E440 Moderate protein-calorie malnutrition: Secondary | ICD-10-CM

## 2022-01-08 DIAGNOSIS — G309 Alzheimer's disease, unspecified: Secondary | ICD-10-CM

## 2022-01-08 DIAGNOSIS — R1314 Dysphagia, pharyngoesophageal phase: Secondary | ICD-10-CM | POA: Diagnosis not present

## 2022-01-08 DIAGNOSIS — I739 Peripheral vascular disease, unspecified: Secondary | ICD-10-CM | POA: Diagnosis not present

## 2022-01-08 DIAGNOSIS — J690 Pneumonitis due to inhalation of food and vomit: Secondary | ICD-10-CM | POA: Diagnosis not present

## 2022-01-09 ENCOUNTER — Non-Acute Institutional Stay: Payer: Medicare Other | Admitting: Hospice

## 2022-01-09 DIAGNOSIS — R634 Abnormal weight loss: Secondary | ICD-10-CM

## 2022-01-09 DIAGNOSIS — G4700 Insomnia, unspecified: Secondary | ICD-10-CM

## 2022-01-09 DIAGNOSIS — F0390 Unspecified dementia without behavioral disturbance: Secondary | ICD-10-CM | POA: Diagnosis not present

## 2022-01-09 DIAGNOSIS — Z515 Encounter for palliative care: Secondary | ICD-10-CM | POA: Diagnosis not present

## 2022-01-09 NOTE — Progress Notes (Signed)
[facility] Consult Note Telephone: [phone]  Fax: [phone]  PATIENT NAME: Krista [employer] [place] [address] [id number] (home)  DOB: 20-Oct-1930 MRN: [id number]  PRIMARY CARE PROVIDER:    Dewayne Shorter, [place],  [place] [address] [phone]  REFERRING PROVIDER:   Dewayne Shorter, [place],  [place] [address] [phone]  RESPONSIBLE PARTY:    Contact Information     Name Relation Home Work [place] Daughter [phone]  [phone]   Tawni Levy Daughter [phone]  [phone]   [place] Ours Granddaughter   [phone]   Mc[place]aughlin,Kathy Daughter   [phone]        I met face to face with patient in the facility. Visit to build trust and highlight Palliative Medicine as specialized medical care for people living with serious illness, aimed at facilitating better quality of life through symptoms relief, assisting with advance care planning and complex medical decision making. This is the initial visit.  NP called [facility] and could not leave a voicemail; number not in service. ASSESSMENT AND / RECOMMENDATIONS:   Advance Care Planning: Our advance care planning conversation included a discussion about:    The value and importance of advance care planning  Difference between Hospice and Palliative care Exploration of goals of care in the event of a sudden injury or illness  Identification and preparation of a healthcare agent  Review and updating or creation of an  advance directive document . Decision not to resuscitate or to de-escalate disease focused treatments due to poor prognosis.  CODE STATUS:Patient is a Do Not Resuscitate  Goals of Care: Goals include to maximize quality of life and symptom management  I spent 20  minutes providing this initial consultation. More than 50% of the time in this consultation was spent on counseling  patient and coordinating communication. --------------------------------------------------------------------------------------------------------------------------------------  Symptom Management/Plan: Dementia: Advanced. Max assist in ADLs, hoyer lift for all transfers, feeder FAST 7C. Continue Memantine as ordered, and ongoing supportive care.  Insomnia: Continue Melatonin as ordered. Abnormal weight loss: Nursing staff Malachy Mood reports ongoing weight loss, poor appetite, current weight approximately 115 Ibs.  Height 5 feet.  She reports family had meeting yesterday with PCP on possibility of hospice service in the future.  Weight weekly x 4.  Initiate Mirtazapine 7.5 mg at bedtime to help boost appetite.  Offer assistance during meals to ensure adequate oral intake.  Routine CBC CMP  Follow up: Palliative care will continue to follow for complex medical decision making, advance care planning, and clarification of goals. Return 6 weeks or prn. Encouraged to call provider sooner with any concerns.   Family /Caregiver/Community Supports: Patient in SNF for ongoing care.  HOSPICE ELIGIBILITY/DIAGNOSIS: TBD  Chief Complaint: Initial Palliative care visit  HISTORY OF PRESENT ILLNESS:  Krista Ellis is a 88 y.o. year old female  with multiple morbidities requiring close monitoring and with high risk of complications and  mortality:  Advanced Dementia, abnormal weight loss, insomnia. Recent hospitalization for UTI/sepsis, completed antibiotics and back to baseline. Thereabouts History obtained from review of EMR, discussion with primary team, caregiver, family and/or Ms. Rister.  Review and summarization of Epic records shows history from other than patient. Rest of 10 point ROS asked and negative. Independent interpretation of tests and reviewed as needed, available labs, patient records, imaging, studies and related documents from the EMR.   PAST MEDICAL HISTORY:  Active Ambulatory Problems  Diagnosis Date Noted   Hypertension 10/17/2010   Hypothyroidism 10/17/2010   Osteoarthritis 10/17/2010   Medicare annual wellness visit, subsequent 10/01/2011   Dyslipidemia 10/01/2011   Postmenopausal estrogen deficiency 11/30/2012   Bladder wall hemorrhage 12/10/2012   Atherosclerotic peripheral vascular disease ([place]) 06/01/2013   Chest wall pain 08/31/2013   Hematoma and contusion 08/31/2013   Abnormal CT scan, chest 09/01/2013   Nephrolithiasis 11/18/2013   Recurrent falls 11/18/2013   Recurrent UTI 01/03/2014   Bilateral hand numbness 01/03/2014   B12 deficiency 03/09/2014   Subdural hematoma ([place]) 05/05/2014   History of subdural hematoma    Gait disturbance 05/15/2014   TIA (transient ischemic attack) 29/93/7169   Systolic murmur 67/89/3810   Malnutrition of moderate degree ([place]) 05/21/2014   Hospital discharge follow-up 07/04/2014   Open wound of knee, leg (except thigh), and ankle, complicated 17/51/0258   Sepsis ([place]) 09/10/2014   UTI (urinary tract infection) 09/14/2014   Enterococcus UTI 09/14/2014   Generalized weakness 09/14/2014   Pulmonary embolism ([place]) 09/22/2014   Atrophic vaginitis 03/01/2015   Microscopic hematuria 03/01/2015   Bladder spasms 03/01/2015   Atypical chest pain 06/17/2013   Bradycardia 02/24/2014   Benign essential HTN 05/08/2014   Arteriosclerosis of coronary artery 02/23/2014   Bladder infection, chronic 09/29/2011   CN (constipation) 05/02/2010   Type 2 diabetes mellitus with hyperglycemia ([place]) 11/24/2012   Essential (primary) hypertension 10/17/2010   Dysphagia, pharyngoesophageal phase 12/10/2012   Diverticular disease of large intestine 12/25/2011   Acid reflux 04/13/2015   Glucose found in urine on examination 11/30/2012   Frank hematuria 12/02/2012   Combined fat and carbohydrate induced hyperlipemia 04/13/2015   HLD (hyperlipidemia) 10/01/2011   Incomplete bladder emptying 09/29/2011   Malaise and fatigue 12/10/2012    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   Arthritis, degenerative 10/17/2010   Bladder retention 12/02/2012   Buttock pain 07/19/2014   Peripheral vascular disease ([place]) 06/17/2013   Post menopausal syndrome 11/30/2012   Encounter for general adult medical examination without abnormal findings 10/01/2011   Closed rib fracture 09/25/2013   Lung nodule, solitary 03/12/2012   Symptoms involving urinary system 12/04/2011   Urge incontinence of urine 09/29/2011   Delayed onset of urination 09/29/2011   Heart valve disease 06/17/2013   Chest pain 05/13/2016   Aspiration pneumonia of both lower lobes due to gastric secretions ([place]) 05/15/2016   Aspiration pneumonia ([place]) 08/14/2016   Alzheimer's disease ([place]) 12/25/2021   Sepsis secondary to UTI ([place]) 12/26/2021   Resolved Ambulatory Problems    Diagnosis Date Noted   Weakness generalized 05/30/2011   Chronic UTI 05/30/2011   Left sided sciatica 08/29/2011   Hypercoagulable state ([place]) 01/26/2012   Glucosuria 11/30/2012   Other malaise and fatigue 12/10/2012   Dysphagia, pharyngoesophageal phase 12/10/2012   Fatigue 02/21/2013   Acute cystitis with hematuria 11/18/2013   UTI (urinary tract infection) 05/19/2014   Seizures ([place])    Facial droop    Dysuria 03/01/2015   Past Medical History:  Diagnosis Date   Arthritis    Bleeding disorder ([place])    Chronic cystitis    Collagen vascular disease ([place])    CVA (cerebral infarction) 2003   Gross hematuria    Hematuria    Hemihypertrophy    History of kidney stones    Hyperlipidemia    Murmur, cardiac    Myocardial infarction [facility])    Pulmonary nodule    Sensory urge incontinence    Thyroid disease    Urinary  frequency     SOCIAL HX:  Social History   Tobacco Use   Smoking status: Never   Smokeless tobacco: Never  Substance Use Topics   Alcohol use: No     FAMILY HX:  Family History  Problem Relation Age of Onset   Hypertension Mother     Transient ischemic attack Mother    Kidney disease Neg Hx    Bladder Cancer Neg Hx    Prostate cancer Neg Hx       ALLERGIES:  Allergies  Allergen Reactions   Levaquin [Levofloxacin]    Sulfa Antibiotics Hives and Itching   Ciprofloxacin Hives and Itching   Doxycycline Hives and Itching   Ferrous Sulfate Hives and Itching   Penicillins Hives and Itching    .Has patient had a PCN reaction causing immediate rash, facial/tongue/throat swelling, SOB or lightheadedness with hypotension: Unknown Has patient had a PCN reaction causing severe rash involving mucus membranes or skin necrosis: Unknown Has patient had a PCN reaction that required hospitalization: Unknown Has patient had a PCN reaction occurring within the last 10 years: Unknown If all of the above answers are "NO", then may proceed with Cephalosporin use.       PERTINENT MEDICATIONS:  Outpatient Encounter Medications as of 01/09/2022  Medication Sig   acetaminophen (TYLENOL) 325 MG tablet Take 650 mg by mouth 3 (three) times daily as needed.   apixaban (ELIQUIS) 2.5 MG TABS tablet Take 2.5 mg by mouth 2 (two) times daily.   benzocaine (ORAJEL) 10 % mucosal gel Use as directed in the mouth or throat 3 (three) times daily as needed for mouth pain.   cetirizine (ZYRTEC) 10 MG tablet Take 10 mg by mouth daily.   Cholecalciferol (VITAMIN D3) 50000 units CAPS Take 50,000 Units by mouth every 30 (thirty) days. Patient takes on the 14th   cyanocobalamin (VITAMIN B12) 500 MCG tablet Take 500 mcg by mouth daily.   dextromethorphan-guaiFENesin (ROBITUSSIN-DM) 10-100 MG/5ML liquid Take 10 mLs by mouth every 4 (four) hours as needed for cough.   feeding supplement (ENSURE ENLIVE / ENSURE PLUS) LIQD Take 237 mLs by mouth 3 (three) times daily between meals.   hydrocortisone cream 1 % Apply 1 Application topically 2 (two) times daily as needed for itching.   hydroxypropyl methylcellulose / hypromellose (ISOPTO TEARS / GONIOVISC) 2.5 %  ophthalmic solution Place 1 drop into both eyes 3 (three) times daily.   levothyroxine (SYNTHROID, LEVOTHROID) 25 MCG tablet TAKE 1 TABLET (25 MCG TOTAL) BY MOUTH DAILY.   melatonin 5 MG TABS Take 1 tablet (5 mg total) by mouth at bedtime as needed.   memantine (NAMENDA) 10 MG tablet Take 10 mg by mouth every 12 (twelve) hours as needed.   nystatin powder Apply 1 Application topically 2 (two) times daily as needed.   olopatadine (PATADAY) 0.1 % ophthalmic solution Place 1 drop into both eyes daily.   phenazopyridine (PYRIDIUM) 100 MG tablet Take 100 mg by mouth 2 (two) times daily as needed for pain.   senna (SENOKOT) 8.6 MG TABS tablet Take 2 tablets by mouth daily.   No facility-administered encounter medications on file as of 01/09/2022.     Thank you for the opportunity to participate in the care of Ms. Gall.  The palliative care team will continue to follow. Please call our office at [phone] if we can be of additional assistance.   Note: Portions of this note were generated with [employer]. Dictation errors may occur despite best attempts  at proofreading.  Teodoro Spray, NP

## 2022-01-09 NOTE — Progress Notes (Signed)
Location:   Skilled Nursing facility    Place of Service:   [facility] center.  Provider:  DNR  Dewayne Shorter, MD  Patient Care Team: Dewayne Shorter, MD as PCP - General [facility])  Extended Emergency Contact Information Primary Emergency Contact: [facility] Address: [address]          [place], [place] [address] [name] of [place] Phone: [phone] Mobile Phone: [phone] Relation: Daughter Secondary Emergency Contact: Tawni Levy Address: [address]          [place], [place] [address] [name] of [place] Phone: [phone] Mobile Phone: [phone] Relation: Daughter  Code Status:  DNR Goals of care: Advanced Directive information    01/06/2022   10:45 AM  Advanced Directives  Does Patient Have a Medical Advance Directive? Yes  Type of Paramedic of [place];Out of facility DNR (pink MOST or yellow form)  Does patient want to make changes to medical advance directive? No - Patient declined  Copy of [place] in Chart? Yes - validated most recent copy scanned in chart (See row information)     No chief complaint on file.   HPI:  Pt is a 88 y.o. female seen today for an acute visit for discussion of goals of care with HCPOA's.   Goals of care conversation with daughters Beckey Rutter, and [name]. Last night she had more dinner than usually . 5 1/2 teaspoons. Supplements per recs of her daughter SLP. She was more alert on Monday than previously. Sometimes it's increasingly difficult to understand what she's saying-- partly because of her  She said the people on TV were watching her. Recognizing people less, but intermitently having some clarity.   She has had some issues with swallowing as well. They didn't know she had thickened liquids. Do not want a feeding tube.   They enjoy the sipping. And coach her with eating and drinking. She has had aspiration periodically and it is scary.    Granddaughter Mindi is on the phone who states she is concerned that her grandmother's body is telling us that she is ready to die, and that she continues to say she is ready to meet Jesus.    Past Medical History:  Diagnosis Date   Arthritis    Bleeding disorder ([place])    Chronic cystitis    Collagen vascular disease ([place])    CVA (cerebral infarction) 2003   Dysuria    Gross hematuria    Hematuria    Hemihypertrophy    History of kidney stones    Hyperlipidemia    Hypertension    Murmur, cardiac    Myocardial infarction [facility])    Peripheral vascular disease (HCC)    s/p stent right leg   Pulmonary nodule    stable on Chest CT March 2014, [facility] at [facility]   Recurrent UTI    Sensory urge incontinence    Thyroid disease    Urinary frequency    Past Surgical History:  Procedure Laterality Date   ABDOMINAL HYSTERECTOMY  1982   APPENDECTOMY     CARDIAC SURGERY     2 failed stents in right leg   [place]  2007   replaced ball in right hip   left rotator cuff     repair   ruptured disc     SPINE SURGERY     steel rod in back   stent placement leg Right 2015  TONSILLECTOMY     TOTAL HIP ARTHROPLASTY     right   TOTAL KNEE ARTHROPLASTY Left     Allergies  Allergen Reactions   Levaquin [Levofloxacin]    Sulfa Antibiotics Hives and Itching   Ciprofloxacin Hives and Itching   Doxycycline Hives and Itching   Ferrous Sulfate Hives and Itching   Penicillins Hives and Itching    .Has patient had a PCN reaction causing immediate rash, facial/tongue/throat swelling, SOB or lightheadedness with hypotension: Unknown Has patient had a PCN reaction causing severe rash involving mucus membranes or skin necrosis: Unknown Has patient had a PCN reaction that required hospitalization: Unknown Has patient had a PCN reaction occurring within the last 10 years: Unknown If all of the above answers are "NO", then may proceed with Cephalosporin use.      Outpatient Encounter Medications as of 01/08/2022  Medication Sig   acetaminophen (TYLENOL) 325 MG tablet Take 650 mg by mouth 3 (three) times daily as needed.   apixaban (ELIQUIS) 2.5 MG TABS tablet Take 2.5 mg by mouth 2 (two) times daily.   benzocaine (ORAJEL) 10 % mucosal gel Use as directed in the mouth or throat 3 (three) times daily as needed for mouth pain.   cetirizine (ZYRTEC) 10 MG tablet Take 10 mg by mouth daily.   Cholecalciferol (VITAMIN D3) 50000 units CAPS Take 50,000 Units by mouth every 30 (thirty) days. Patient takes on the 14th   cyanocobalamin (VITAMIN B12) 500 MCG tablet Take 500 mcg by mouth daily.   dextromethorphan-guaiFENesin (ROBITUSSIN-DM) 10-100 MG/5ML liquid Take 10 mLs by mouth every 4 (four) hours as needed for cough.   feeding supplement (ENSURE ENLIVE / ENSURE PLUS) LIQD Take 237 mLs by mouth 3 (three) times daily between meals.   hydrocortisone cream 1 % Apply 1 Application topically 2 (two) times daily as needed for itching.   hydroxypropyl methylcellulose / hypromellose (ISOPTO TEARS / GONIOVISC) 2.5 % ophthalmic solution Place 1 drop into both eyes 3 (three) times daily.   levothyroxine (SYNTHROID, LEVOTHROID) 25 MCG tablet TAKE 1 TABLET (25 MCG TOTAL) BY MOUTH DAILY.   melatonin 5 MG TABS Take 1 tablet (5 mg total) by mouth at bedtime as needed.   memantine (NAMENDA) 10 MG tablet Take 10 mg by mouth every 12 (twelve) hours as needed.   nystatin powder Apply 1 Application topically 2 (two) times daily as needed.   olopatadine (PATADAY) 0.1 % ophthalmic solution Place 1 drop into both eyes daily.   phenazopyridine (PYRIDIUM) 100 MG tablet Take 100 mg by mouth 2 (two) times daily as needed for pain.   senna (SENOKOT) 8.6 MG TABS tablet Take 2 tablets by mouth daily.   No facility-administered encounter medications on file as of 01/08/2022.    Review of Systems  Immunization History  Administered Date(s) Administered   Influenza Split 10/26/2010,  01/26/2012, 09/20/2013   Influenza-Unspecified 11/30/2012, 11/05/2021   Moderna Sars-Covid-2 Vaccination 02/28/2019, 03/28/2019   Pneumococcal Conjugate-13 12/21/2007   Pneumococcal Polysaccharide-23 12/01/2007   Unspecified SARS-COV-2 Vaccination 12/02/2019, 06/08/2020, 10/12/2020   Pertinent  Health Maintenance Due  Topic Date Due   HEMOGLOBIN A1C  01/25/2022 (Originally 11/12/2016)   INFLUENZA VACCINE  Completed   DEXA SCAN  Completed   FOOT EXAM  Discontinued   OPHTHALMOLOGY EXAM  Discontinued      12/28/2021    9:00 AM 12/28/2021   10:22 PM 12/29/2021   11:25 AM 12/29/2021   10:05 PM 12/30/2021    7:15 AM  Fall Risk  Patient Fall Risk Level High fall risk High fall risk High fall risk High fall risk High fall risk   Functional Status Survey:    There were no vitals filed for this visit. There is no height or weight on file to calculate BMI. Physical Exam Neurological:     Mental Status: She is alert. Mental status is at baseline. She is disoriented.     Labs reviewed: Recent Labs    12/27/21 0743 12/29/21 0605 12/30/21 0510  NA 137 138 140  K 4.2 3.8 4.1  CL 105 106 106  CO2 '27 28 27  '$ GLUCOSE 107* 100* 105*  BUN 10 6* 10  CREATININE 0.67 0.55 0.60  CALCIUM 9.2 9.2 9.3  MG 1.8 1.9 2.1  PHOS 2.7 2.0* 3.1   Recent Labs    11/21/21 0000 12/26/21 0519  AST 12* 19  ALT 6* 10  ALKPHOS 41 54  BILITOT  --  0.7  PROT  --  6.9  ALBUMIN 3.5 3.2*   Recent Labs    12/26/21 0519 12/27/21 0743 12/29/21 0605 12/30/21 0510  WBC 19.9* 17.7* 8.4 7.7  NEUTROABS 16.2*  --   --   --   HGB 12.6 11.3* 11.6* 11.9*  HCT 39.9 35.2* 36.2 37.2  MCV 96.6 95.4 96.3 96.1  PLT 343 301 337 380   Lab Results  Component Value Date   TSH 2.67 11/20/2021   Lab Results  Component Value Date   HGBA1C 5.3 05/13/2016   Lab Results  Component Value Date   CHOL 176 11/21/2021   HDL 37 11/21/2021   LDLCALC 114 11/21/2021   TRIG 142 11/21/2021   CHOLHDL 4 08/11/2014     Significant Diagnostic Results in last 30 days:  CT Chest Wo Contrast  Result Date: 12/26/2021 CLINICAL DATA:  Shortness of breath EXAM: CT CHEST WITHOUT CONTRAST TECHNIQUE: Multidetector CT imaging of the chest was performed following the standard protocol without IV contrast. RADIATION DOSE REDUCTION: This exam was performed according to the departmental dose-optimization program which includes automated exposure control, adjustment of the mA and/or kV according to patient size and/or use of iterative reconstruction technique. COMPARISON:  CTA chest dated 01/25/2016 FINDINGS: Cardiovascular: Normal heart size. No significant pericardial fluid/thickening. Aortic atherosclerosis. Aortic valvular and mitral annular calcifications. Coronary artery calcifications. Great vessels are normal in course and caliber. Mediastinum/Nodes: Thyroid gland without nodules meeting criteria for imaging follow-up by size. Normal esophagus. Subcarinal lymph node measures 1.3 cm (2:60), likely reactive. Lungs/Pleura: The central airways are patent. Layering secretions within the trachea extending to the left main bronchus. Diffuse bronchial wall thickening and bilateral lower lobe subsegmental mucous plugging. Multifocal nodules and peribronchovascular consolidations, involving all lobes, though most pronounced in the right lower lobe. No pneumothorax. No pleural effusion. Upper abdomen: Cholelithiasis. Musculoskeletal: No acute or abnormal lytic or blastic osseous lesions. IMPRESSION: 1. Multifocal nodules and peribronchovascular consolidations, involving all lobes, though most pronounced in the right lower lobe. Findings are most consistent with multifocal pneumonia. Recommend follow-up chest CT in 3 months after treatment to ensure resolution. 2. Subcarinal lymphadenopathy, likely reactive. 3. Cholelithiasis. 4. Coronary artery calcifications. Aortic valvular and mitral annular calcifications. Aortic Atherosclerosis  (ICD10-I70.0). Electronically Signed   By: Darrin Nipper M.D.   On: 12/26/2021 09:14   CT Abdomen Pelvis W Contrast  Result Date: 12/26/2021 CLINICAL DATA:  Acute generalized abdominal pain. EXAM: CT ABDOMEN AND PELVIS WITH CONTRAST TECHNIQUE: Multidetector CT imaging of the abdomen and pelvis was performed using the standard protocol  following bolus administration of intravenous contrast. RADIATION DOSE REDUCTION: This exam was performed according to the departmental dose-optimization program which includes automated exposure control, adjustment of the mA and/or kV according to patient size and/or use of iterative reconstruction technique. CONTRAST:  158m OMNIPAQUE IOHEXOL 300 MG/ML  SOLN COMPARISON:  February 26, 2015. FINDINGS: Lower chest: Mild right basilar subsegmental atelectasis or infiltrate is noted. Multiple nodular opacities are noted in both lungs concerning for metastatic disease or multifocal infection. Hepatobiliary: No focal liver abnormality is seen. No gallstones, gallbladder wall thickening, or biliary dilatation. Pancreas: Unremarkable. No pancreatic ductal dilatation or surrounding inflammatory changes. Spleen: Normal in size without focal abnormality. Adrenals/Urinary Tract: Adrenal glands appear normal. Multiple small probable bilateral renal cysts are noted. No hydronephrosis or renal obstruction is noted. Urinary bladder is unremarkable. Stomach/Bowel: The stomach appears normal. Sigmoid diverticulosis is noted without inflammation. There is no evidence of bowel obstruction. Status post appendectomy. Vascular/Lymphatic: Aortic atherosclerosis. No enlarged abdominal or pelvic lymph nodes. Reproductive: Status post hysterectomy. No adnexal masses. Other: No abdominal wall hernia or abnormality. No abdominopelvic ascites. Musculoskeletal: Status post right total hip arthroplasty. Postsurgical changes are seen involving the lumbar spine. No acute osseous abnormality is noted. IMPRESSION: Mild  right basilar opacity is noted concerning for atelectasis or infiltrate. Multiple nodular opacities are noted in the visualized portion of the lung bases, right greater than left, concerning for metastatic disease or possibly multifocal inflammation. CT scan of the chest is recommended for further evaluation. Sigmoid diverticulosis without inflammation. Aortic Atherosclerosis (ICD10-I70.0). Electronically Signed   By: JMarijo ConceptionM.D.   On: 12/26/2021 08:00   DG Chest Portable 1 View  Result Date: 12/26/2021 CLINICAL DATA:  88year old female with shortness of breath, fever, decreased p.o., hypoxia. EXAM: PORTABLE CHEST 1 VIEW COMPARISON:  Chest radiographs 04/16/2017 and earlier. FINDINGS: Portable AP semi upright view at 0527 hours. Mildly lower lung volumes. Mediastinal contours remain normal. Mild for age, coarse bilateral pulmonary interstitial opacity appears to be chronic and stable since 2019. Otherwise Allowing for portable technique the lungs are clear. No pneumothorax or pleural effusion. No acute osseous abnormality identified. Negative visible bowel gas pattern. IMPRESSION: No acute cardiopulmonary abnormality. Electronically Signed   By: HGenevie AnnM.D.   On: 12/26/2021 06:01    Assessment/Plan Alzheimer's disease ([place] - Plan: Do not attempt resuscitation (DNR)  Peripheral vascular disease ([place]  Malnutrition of moderate degree ([place]  Dysphagia, pharyngoesophageal phase  Aspiration pneumonia, unspecified aspiration pneumonia type, unspecified laterality, unspecified part of lung ([place] Discussed goals of care with daughters. Continued concern for decline. Discussed hx of UTIs and aspiration pneumonias. DNR status. Interested in MOST form, however, not prepared to complete at this time. Offered copies for review and will continue goals of care conversations. Patient will be followed by palliative care from previous hospitalization but are not ready [facility]  [facility]. They want to honor her wishes to pass without tubes and are certain they do not want feeding tubes under any condition.    Family/ staff Communication: daughters, nursing  Labs/tests ordered:  none.  I spent >70 minutes in face to face time discussing goals of care and advanced care planning with family

## 2022-01-17 ENCOUNTER — Telehealth: Payer: Self-pay | Admitting: Student

## 2022-01-17 NOTE — Telephone Encounter (Signed)
Spoke with patient's daughter, Ronald Pippins to discuss goals of care. Discussed further MOST form. No changes made to current care plan. She will continue discussion of Hospice with her sisters.   Tomasa Rand, MD, [facility] [phone]

## 2022-01-21 ENCOUNTER — Other Ambulatory Visit: Payer: Self-pay | Admitting: Nurse Practitioner

## 2022-01-21 DIAGNOSIS — F0280 Dementia in other diseases classified elsewhere without behavioral disturbance: Secondary | ICD-10-CM

## 2022-01-22 DIAGNOSIS — I7091 Generalized atherosclerosis: Secondary | ICD-10-CM | POA: Diagnosis not present

## 2022-01-22 DIAGNOSIS — B351 Tinea unguium: Secondary | ICD-10-CM | POA: Diagnosis not present

## 2022-01-23 DIAGNOSIS — I69818 Other symptoms and signs involving cognitive functions following other cerebrovascular disease: Secondary | ICD-10-CM | POA: Diagnosis not present

## 2022-01-23 DIAGNOSIS — I10 Essential (primary) hypertension: Secondary | ICD-10-CM | POA: Diagnosis not present

## 2022-01-23 DIAGNOSIS — Z8673 Personal history of transient ischemic attack (TIA), and cerebral infarction without residual deficits: Secondary | ICD-10-CM | POA: Diagnosis not present

## 2022-01-23 DIAGNOSIS — I739 Peripheral vascular disease, unspecified: Secondary | ICD-10-CM | POA: Diagnosis not present

## 2022-01-23 DIAGNOSIS — E039 Hypothyroidism, unspecified: Secondary | ICD-10-CM | POA: Diagnosis not present

## 2022-01-23 DIAGNOSIS — E785 Hyperlipidemia, unspecified: Secondary | ICD-10-CM | POA: Diagnosis not present

## 2022-01-23 DIAGNOSIS — Z681 Body mass index (BMI) 19 or less, adult: Secondary | ICD-10-CM | POA: Diagnosis not present

## 2022-01-23 DIAGNOSIS — M1990 Unspecified osteoarthritis, unspecified site: Secondary | ICD-10-CM | POA: Diagnosis not present

## 2022-01-23 DIAGNOSIS — F0150 Vascular dementia without behavioral disturbance: Secondary | ICD-10-CM | POA: Diagnosis not present

## 2022-01-23 DIAGNOSIS — R634 Abnormal weight loss: Secondary | ICD-10-CM | POA: Diagnosis not present

## 2022-01-23 DIAGNOSIS — K219 Gastro-esophageal reflux disease without esophagitis: Secondary | ICD-10-CM | POA: Diagnosis not present

## 2022-01-23 DIAGNOSIS — I252 Old myocardial infarction: Secondary | ICD-10-CM | POA: Diagnosis not present

## 2022-01-24 DIAGNOSIS — E785 Hyperlipidemia, unspecified: Secondary | ICD-10-CM | POA: Diagnosis not present

## 2022-01-24 DIAGNOSIS — I739 Peripheral vascular disease, unspecified: Secondary | ICD-10-CM | POA: Diagnosis not present

## 2022-01-24 DIAGNOSIS — I69818 Other symptoms and signs involving cognitive functions following other cerebrovascular disease: Secondary | ICD-10-CM | POA: Diagnosis not present

## 2022-01-24 DIAGNOSIS — R634 Abnormal weight loss: Secondary | ICD-10-CM | POA: Diagnosis not present

## 2022-01-24 DIAGNOSIS — M1990 Unspecified osteoarthritis, unspecified site: Secondary | ICD-10-CM | POA: Diagnosis not present

## 2022-01-24 DIAGNOSIS — F0150 Vascular dementia without behavioral disturbance: Secondary | ICD-10-CM | POA: Diagnosis not present

## 2022-01-25 DIAGNOSIS — R634 Abnormal weight loss: Secondary | ICD-10-CM | POA: Diagnosis not present

## 2022-01-25 DIAGNOSIS — M1990 Unspecified osteoarthritis, unspecified site: Secondary | ICD-10-CM | POA: Diagnosis not present

## 2022-01-25 DIAGNOSIS — I69818 Other symptoms and signs involving cognitive functions following other cerebrovascular disease: Secondary | ICD-10-CM | POA: Diagnosis not present

## 2022-01-25 DIAGNOSIS — I739 Peripheral vascular disease, unspecified: Secondary | ICD-10-CM | POA: Diagnosis not present

## 2022-01-25 DIAGNOSIS — E785 Hyperlipidemia, unspecified: Secondary | ICD-10-CM | POA: Diagnosis not present

## 2022-01-25 DIAGNOSIS — F0150 Vascular dementia without behavioral disturbance: Secondary | ICD-10-CM | POA: Diagnosis not present

## 2022-01-31 DIAGNOSIS — R634 Abnormal weight loss: Secondary | ICD-10-CM | POA: Diagnosis not present

## 2022-01-31 DIAGNOSIS — F0150 Vascular dementia without behavioral disturbance: Secondary | ICD-10-CM | POA: Diagnosis not present

## 2022-01-31 DIAGNOSIS — I739 Peripheral vascular disease, unspecified: Secondary | ICD-10-CM | POA: Diagnosis not present

## 2022-01-31 DIAGNOSIS — E785 Hyperlipidemia, unspecified: Secondary | ICD-10-CM | POA: Diagnosis not present

## 2022-01-31 DIAGNOSIS — M1990 Unspecified osteoarthritis, unspecified site: Secondary | ICD-10-CM | POA: Diagnosis not present

## 2022-01-31 DIAGNOSIS — I69818 Other symptoms and signs involving cognitive functions following other cerebrovascular disease: Secondary | ICD-10-CM | POA: Diagnosis not present

## 2022-02-05 DIAGNOSIS — I739 Peripheral vascular disease, unspecified: Secondary | ICD-10-CM | POA: Diagnosis not present

## 2022-02-05 DIAGNOSIS — F0150 Vascular dementia without behavioral disturbance: Secondary | ICD-10-CM | POA: Diagnosis not present

## 2022-02-05 DIAGNOSIS — R634 Abnormal weight loss: Secondary | ICD-10-CM | POA: Diagnosis not present

## 2022-02-05 DIAGNOSIS — M1990 Unspecified osteoarthritis, unspecified site: Secondary | ICD-10-CM | POA: Diagnosis not present

## 2022-02-05 DIAGNOSIS — I69818 Other symptoms and signs involving cognitive functions following other cerebrovascular disease: Secondary | ICD-10-CM | POA: Diagnosis not present

## 2022-02-05 DIAGNOSIS — E785 Hyperlipidemia, unspecified: Secondary | ICD-10-CM | POA: Diagnosis not present

## 2022-02-07 DIAGNOSIS — I69818 Other symptoms and signs involving cognitive functions following other cerebrovascular disease: Secondary | ICD-10-CM | POA: Diagnosis not present

## 2022-02-07 DIAGNOSIS — E785 Hyperlipidemia, unspecified: Secondary | ICD-10-CM | POA: Diagnosis not present

## 2022-02-07 DIAGNOSIS — M1990 Unspecified osteoarthritis, unspecified site: Secondary | ICD-10-CM | POA: Diagnosis not present

## 2022-02-07 DIAGNOSIS — F0150 Vascular dementia without behavioral disturbance: Secondary | ICD-10-CM | POA: Diagnosis not present

## 2022-02-07 DIAGNOSIS — I739 Peripheral vascular disease, unspecified: Secondary | ICD-10-CM | POA: Diagnosis not present

## 2022-02-07 DIAGNOSIS — R634 Abnormal weight loss: Secondary | ICD-10-CM | POA: Diagnosis not present

## 2022-02-13 DIAGNOSIS — R634 Abnormal weight loss: Secondary | ICD-10-CM | POA: Diagnosis not present

## 2022-02-13 DIAGNOSIS — F0150 Vascular dementia without behavioral disturbance: Secondary | ICD-10-CM | POA: Diagnosis not present

## 2022-02-13 DIAGNOSIS — E785 Hyperlipidemia, unspecified: Secondary | ICD-10-CM | POA: Diagnosis not present

## 2022-02-13 DIAGNOSIS — I69818 Other symptoms and signs involving cognitive functions following other cerebrovascular disease: Secondary | ICD-10-CM | POA: Diagnosis not present

## 2022-02-13 DIAGNOSIS — M1990 Unspecified osteoarthritis, unspecified site: Secondary | ICD-10-CM | POA: Diagnosis not present

## 2022-02-13 DIAGNOSIS — I739 Peripheral vascular disease, unspecified: Secondary | ICD-10-CM | POA: Diagnosis not present

## 2022-02-14 ENCOUNTER — Non-Acute Institutional Stay (SKILLED_NURSING_FACILITY): Payer: Medicare Other | Admitting: Student

## 2022-02-14 ENCOUNTER — Encounter: Payer: Self-pay | Admitting: Student

## 2022-02-14 DIAGNOSIS — I2699 Other pulmonary embolism without acute cor pulmonale: Secondary | ICD-10-CM

## 2022-02-14 DIAGNOSIS — E1165 Type 2 diabetes mellitus with hyperglycemia: Secondary | ICD-10-CM | POA: Diagnosis not present

## 2022-02-14 DIAGNOSIS — Z515 Encounter for palliative care: Secondary | ICD-10-CM

## 2022-02-14 DIAGNOSIS — G309 Alzheimer's disease, unspecified: Secondary | ICD-10-CM

## 2022-02-14 DIAGNOSIS — F0280 Dementia in other diseases classified elsewhere without behavioral disturbance: Secondary | ICD-10-CM

## 2022-02-14 DIAGNOSIS — S065XAA Traumatic subdural hemorrhage with loss of consciousness status unknown, initial encounter: Secondary | ICD-10-CM | POA: Diagnosis not present

## 2022-02-14 DIAGNOSIS — E440 Moderate protein-calorie malnutrition: Secondary | ICD-10-CM

## 2022-02-14 NOTE — Progress Notes (Unsigned)
Location:  [employer].  Nursing Home Room Number: [facility] [address] Place of Service:  SNF [phone]) Provider:  Dewayne Shorter, MD  Patient Care Team: Dewayne Shorter, MD as PCP - General [facility])  Extended Emergency Contact Information Primary Emergency Contact: [facility] Address: [address]          [place], [place] [address] [name] of [place] Phone: [phone] Mobile Phone: [phone] Relation: Daughter Secondary Emergency Contact: Tawni Levy Address: [address]          [place], [place] [address] [name] of [place] Phone: [phone] Mobile Phone: [phone] Relation: Daughter  Code Status:  DNR Goals of care: Advanced Directive information    02/14/2022    9:36 AM  Advanced Directives  Does Patient Have a Medical Advance Directive? Yes  Type of Paramedic of [place];Out of facility DNR (pink MOST or yellow form)  Does patient want to make changes to medical advance directive? No - Patient declined  Copy of [place] in Chart? Yes - validated most recent copy scanned in chart (See row information)     Chief Complaint  Patient presents with   Medical Management of Chronic Issues    Medical Management of Chronic Issues.     HPI:  Pt is a 89 y.o. female seen today for medical management of chronic diseases.   Patient is in good spirits smiling and talking to neighbor residents.  She is complementing numerous people around stating Wynell Balloon denies any knowledge very nice.  She is ready to be rolled over to the table so she can have her breakfast.  Per nursing patient continues to have poor p.o. intake and primarily drinking ensures.  Patient was previously transition to hospice level of care hospice continues to follow.  Patient is total care.  Wheelchair dependent   Past Medical History:  Diagnosis Date   Arthritis    Bleeding disorder ([place])    Chronic cystitis    Collagen vascular disease  ([place])    CVA (cerebral infarction) 2003   Dysuria    Gross hematuria    Hematuria    Hemihypertrophy    History of kidney stones    Hyperlipidemia    Hypertension    Murmur, cardiac    Myocardial infarction [facility])    Peripheral vascular disease ([place])    s/p stent right leg   Pulmonary nodule    stable on Chest CT March 2014, Follow[facility] at [facility]   Recurrent UTI    Sensory urge incontinence    Thyroid disease    Urinary frequency    Past Surgical History:  Procedure Laterality Date   ABDOMINAL HYSTERECTOMY  1982   APPENDECTOMY     CARDIAC SURGERY     2 failed stents in right leg   [place]  2007   replaced ball in right hip   left rotator cuff     repair   ruptured disc     SPINE SURGERY     steel rod in back   stent placement leg Right 2015   TONSILLECTOMY     TOTAL HIP ARTHROPLASTY     right   TOTAL KNEE ARTHROPLASTY Left     Allergies  Allergen Reactions   Levaquin [Levofloxacin]    Sulfa Antibiotics Hives and Itching   Ciprofloxacin Hives and Itching   Doxycycline Hives and Itching   Ferrous Sulfate Hives and Itching   Penicillins Hives and Itching    .  Has patient had a PCN reaction causing immediate rash, facial/tongue/throat swelling, SOB or lightheadedness with hypotension: Unknown Has patient had a PCN reaction causing severe rash involving mucus membranes or skin necrosis: Unknown Has patient had a PCN reaction that required hospitalization: Unknown Has patient had a PCN reaction occurring within the last 10 years: Unknown If all of the above answers are "NO", then may proceed with Cephalosporin use.     Outpatient Encounter Medications as of 02/14/2022  Medication Sig   acetaminophen (TYLENOL) 325 MG tablet Take 650 mg by mouth every 4 (four) hours as needed.   apixaban (ELIQUIS) 2.5 MG TABS tablet Take 2.5 mg by mouth 2 (two) times daily.   benzocaine (ORAJEL) 10 % mucosal gel Use as directed in the mouth or throat 3  (three) times daily as needed for mouth pain.   cetirizine (ZYRTEC) 10 MG tablet Take 10 mg by mouth daily.   dextromethorphan-guaiFENesin (ROBITUSSIN-DM) 10-100 MG/5ML liquid Take 10 mLs by mouth every 4 (four) hours as needed for cough.   feeding supplement (ENSURE ENLIVE / ENSURE PLUS) LIQD Take 237 mLs by mouth 3 (three) times daily between meals.   hydrocortisone cream 1 % Apply 1 Application topically 2 (two) times daily as needed for itching.   Infant Care Products (DERMACLOUD EX) Nystatin Cream 3:1 every shift for yeast.   levothyroxine (SYNTHROID, LEVOTHROID) 25 MCG tablet TAKE 1 TABLET (25 MCG TOTAL) BY MOUTH DAILY.   Melatonin 5 MG CAPS Take 1 capsule by mouth at bedtime as needed.   nystatin powder Apply 1 Application topically 2 (two) times daily as needed.   phenazopyridine (PYRIDIUM) 100 MG tablet Take 100 mg by mouth 2 (two) times daily as needed for pain.   senna (SENOKOT) 8.6 MG TABS tablet Take 2 tablets by mouth daily.   [DISCONTINUED] Cholecalciferol (VITAMIN D3) 50000 units CAPS Take 50,000 Units by mouth every 30 (thirty) days. Patient takes on the 14th   [DISCONTINUED] cyanocobalamin (VITAMIN B12) 500 MCG tablet Take 500 mcg by mouth daily.   [DISCONTINUED] hydroxypropyl methylcellulose / hypromellose (ISOPTO TEARS / GONIOVISC) 2.5 % ophthalmic solution Place 1 drop into both eyes 3 (three) times daily.   [DISCONTINUED] memantine (NAMENDA) 10 MG tablet Take 10 mg by mouth every 12 (twelve) hours as needed.   [DISCONTINUED] olopatadine (PATADAY) 0.1 % ophthalmic solution Place 1 drop into both eyes daily.   No facility-administered encounter medications on file as of 02/14/2022.    Review of Systems  Immunization History  Administered Date(s) Administered   Influenza Split 10/26/2010, 01/26/2012, 09/20/2013   Influenza-Unspecified 11/30/2012, 11/05/2020, 11/05/2021   Moderna Sars-Covid-2 Vaccination 02/28/2019, 03/28/2019   Pneumococcal Conjugate-13 12/21/2007    Pneumococcal Polysaccharide-23 12/01/2007   Pneumococcal-Unspecified 11/22/2007   Unspecified SARS-COV-2 Vaccination 12/02/2019, 06/08/2020, 10/12/2020   Pertinent  Health Maintenance Due  Topic Date Due   HEMOGLOBIN A1C  11/12/2016   INFLUENZA VACCINE  Completed   DEXA SCAN  Completed   FOOT EXAM  Discontinued   OPHTHALMOLOGY EXAM  Discontinued      12/28/2021    9:00 AM 12/28/2021   10:22 PM 12/29/2021   11:25 AM 12/29/2021   10:05 PM 12/30/2021    7:15 AM  Fall Risk  (RETIRED) Patient Fall Risk Level High fall risk High fall risk High fall risk High fall risk High fall risk   Functional Status Survey:    Vitals:   02/14/22 0920  BP: 129/60  Pulse: 73  Resp: 20  Temp: 97.8 F (36.6 C)  SpO2: 95%  Weight: 123 lb 6.4 oz (56 kg)  Height: '5\' 5"'$  (1.651 m)   Body mass index is 20.53 kg/m. Physical Exam Vitals reviewed.  Cardiovascular:     Rate and Rhythm: Normal rate.     Pulses: Normal pulses.  Pulmonary:     Effort: Pulmonary effort is normal.  Skin:    General: Skin is warm and dry.  Neurological:     Mental Status: She is alert. Mental status is at baseline.     Labs reviewed: Recent Labs    12/27/21 0743 12/29/21 0605 12/30/21 0510  NA 137 138 140  K 4.2 3.8 4.1  CL 105 106 106  CO2 '27 28 27  '$ GLUCOSE 107* 100* 105*  BUN 10 6* 10  CREATININE 0.67 0.55 0.60  CALCIUM 9.2 9.2 9.3  MG 1.8 1.9 2.1  PHOS 2.7 2.0* 3.1   Recent Labs    11/21/21 0000 12/26/21 0519  AST 12* 19  ALT 6* 10  ALKPHOS 41 54  BILITOT  --  0.7  PROT  --  6.9  ALBUMIN 3.5 3.2*   Recent Labs    12/26/21 0519 12/27/21 0743 12/29/21 0605 12/30/21 0510  WBC 19.9* 17.7* 8.4 7.7  NEUTROABS 16.2*  --   --   --   HGB 12.6 11.3* 11.6* 11.9*  HCT 39.9 35.2* 36.2 37.2  MCV 96.6 95.4 96.3 96.1  PLT 343 301 337 380   Lab Results  Component Value Date   TSH 2.67 11/20/2021   Lab Results  Component Value Date   HGBA1C 5.3 05/13/2016   Lab Results  Component Value  Date   CHOL 176 11/21/2021   HDL 37 11/21/2021   LDLCALC 114 11/21/2021   TRIG 142 11/21/2021   CHOLHDL 4 08/11/2014    Significant Diagnostic Results in last 30 days:  No results found.  Assessment/Plan Malnutrition of moderate degree ([place]), Chronic Patient has lost 10 LB's in the last month due to poor p.o. intake.  Periodically drinks ensures.  This is a's result of underlying memory changes and overall decline.  Will continue to monitor.  Will continue hospice level of care.   Recurrent pulmonary emboli ([place]), Chronic Patient with history of multiple pulmonary emboli.  No symptoms at this time.  Continues on Eliquis 2.5 mg twice daily.  Alzheimer's disease ([place]), Chronic Patient with weight loss, disorientation at baseline.  Continues to be communicative, however significant weight loss in the last month concerning for overall decline.  BMI 20.53.  Continue hospice level of care.  Minimize lab collection per comfort measures.  Type 2 diabetes mellitus with hyperglycemia, without long-term current use of insulin ([place]), Chronic Diet controlled.  No labs recently.  To transition to comfort care.  Will continue to monitor for signs of distress and treat accordingly.  SDH (subdural hematoma) ([place]), Chronic Patient is at baseline mental status and continues to remain stable current lead.  Hospice Care Patient recently transition to hospice level of care.  Continue to monitor for symptoms and apparent evidence of decline.  Family/ staff Communication: nursing  Labs/tests ordered:  none

## 2022-02-17 DIAGNOSIS — I739 Peripheral vascular disease, unspecified: Secondary | ICD-10-CM | POA: Diagnosis not present

## 2022-02-17 DIAGNOSIS — R634 Abnormal weight loss: Secondary | ICD-10-CM | POA: Diagnosis not present

## 2022-02-17 DIAGNOSIS — E785 Hyperlipidemia, unspecified: Secondary | ICD-10-CM | POA: Diagnosis not present

## 2022-02-17 DIAGNOSIS — I69818 Other symptoms and signs involving cognitive functions following other cerebrovascular disease: Secondary | ICD-10-CM | POA: Diagnosis not present

## 2022-02-17 DIAGNOSIS — F0150 Vascular dementia without behavioral disturbance: Secondary | ICD-10-CM | POA: Diagnosis not present

## 2022-02-17 DIAGNOSIS — M1990 Unspecified osteoarthritis, unspecified site: Secondary | ICD-10-CM | POA: Diagnosis not present

## 2022-02-18 DIAGNOSIS — R634 Abnormal weight loss: Secondary | ICD-10-CM | POA: Diagnosis not present

## 2022-02-18 DIAGNOSIS — F0150 Vascular dementia without behavioral disturbance: Secondary | ICD-10-CM | POA: Diagnosis not present

## 2022-02-18 DIAGNOSIS — M1990 Unspecified osteoarthritis, unspecified site: Secondary | ICD-10-CM | POA: Diagnosis not present

## 2022-02-18 DIAGNOSIS — I69818 Other symptoms and signs involving cognitive functions following other cerebrovascular disease: Secondary | ICD-10-CM | POA: Diagnosis not present

## 2022-02-18 DIAGNOSIS — E785 Hyperlipidemia, unspecified: Secondary | ICD-10-CM | POA: Diagnosis not present

## 2022-02-18 DIAGNOSIS — I739 Peripheral vascular disease, unspecified: Secondary | ICD-10-CM | POA: Diagnosis not present

## 2022-02-19 DIAGNOSIS — F0150 Vascular dementia without behavioral disturbance: Secondary | ICD-10-CM | POA: Diagnosis not present

## 2022-02-19 DIAGNOSIS — I69818 Other symptoms and signs involving cognitive functions following other cerebrovascular disease: Secondary | ICD-10-CM | POA: Diagnosis not present

## 2022-02-19 DIAGNOSIS — I739 Peripheral vascular disease, unspecified: Secondary | ICD-10-CM | POA: Diagnosis not present

## 2022-02-19 DIAGNOSIS — R634 Abnormal weight loss: Secondary | ICD-10-CM | POA: Diagnosis not present

## 2022-02-19 DIAGNOSIS — E785 Hyperlipidemia, unspecified: Secondary | ICD-10-CM | POA: Diagnosis not present

## 2022-02-19 DIAGNOSIS — M1990 Unspecified osteoarthritis, unspecified site: Secondary | ICD-10-CM | POA: Diagnosis not present

## 2022-02-20 DIAGNOSIS — I69818 Other symptoms and signs involving cognitive functions following other cerebrovascular disease: Secondary | ICD-10-CM | POA: Diagnosis not present

## 2022-02-20 DIAGNOSIS — Z681 Body mass index (BMI) 19 or less, adult: Secondary | ICD-10-CM | POA: Diagnosis not present

## 2022-02-20 DIAGNOSIS — M1990 Unspecified osteoarthritis, unspecified site: Secondary | ICD-10-CM | POA: Diagnosis not present

## 2022-02-20 DIAGNOSIS — R634 Abnormal weight loss: Secondary | ICD-10-CM | POA: Diagnosis not present

## 2022-02-20 DIAGNOSIS — K219 Gastro-esophageal reflux disease without esophagitis: Secondary | ICD-10-CM | POA: Diagnosis not present

## 2022-02-20 DIAGNOSIS — E039 Hypothyroidism, unspecified: Secondary | ICD-10-CM | POA: Diagnosis not present

## 2022-02-20 DIAGNOSIS — Z8673 Personal history of transient ischemic attack (TIA), and cerebral infarction without residual deficits: Secondary | ICD-10-CM | POA: Diagnosis not present

## 2022-02-20 DIAGNOSIS — I739 Peripheral vascular disease, unspecified: Secondary | ICD-10-CM | POA: Diagnosis not present

## 2022-02-20 DIAGNOSIS — I10 Essential (primary) hypertension: Secondary | ICD-10-CM | POA: Diagnosis not present

## 2022-02-20 DIAGNOSIS — E785 Hyperlipidemia, unspecified: Secondary | ICD-10-CM | POA: Diagnosis not present

## 2022-02-20 DIAGNOSIS — F0150 Vascular dementia without behavioral disturbance: Secondary | ICD-10-CM | POA: Diagnosis not present

## 2022-02-20 DIAGNOSIS — I252 Old myocardial infarction: Secondary | ICD-10-CM | POA: Diagnosis not present

## 2022-02-24 DIAGNOSIS — I69818 Other symptoms and signs involving cognitive functions following other cerebrovascular disease: Secondary | ICD-10-CM | POA: Diagnosis not present

## 2022-02-24 DIAGNOSIS — I739 Peripheral vascular disease, unspecified: Secondary | ICD-10-CM | POA: Diagnosis not present

## 2022-02-24 DIAGNOSIS — E785 Hyperlipidemia, unspecified: Secondary | ICD-10-CM | POA: Diagnosis not present

## 2022-02-24 DIAGNOSIS — M1990 Unspecified osteoarthritis, unspecified site: Secondary | ICD-10-CM | POA: Diagnosis not present

## 2022-02-24 DIAGNOSIS — R634 Abnormal weight loss: Secondary | ICD-10-CM | POA: Diagnosis not present

## 2022-02-24 DIAGNOSIS — F0150 Vascular dementia without behavioral disturbance: Secondary | ICD-10-CM | POA: Diagnosis not present

## 2022-02-26 DIAGNOSIS — M1990 Unspecified osteoarthritis, unspecified site: Secondary | ICD-10-CM | POA: Diagnosis not present

## 2022-02-26 DIAGNOSIS — I69818 Other symptoms and signs involving cognitive functions following other cerebrovascular disease: Secondary | ICD-10-CM | POA: Diagnosis not present

## 2022-02-26 DIAGNOSIS — F0150 Vascular dementia without behavioral disturbance: Secondary | ICD-10-CM | POA: Diagnosis not present

## 2022-02-26 DIAGNOSIS — R634 Abnormal weight loss: Secondary | ICD-10-CM | POA: Diagnosis not present

## 2022-02-26 DIAGNOSIS — E785 Hyperlipidemia, unspecified: Secondary | ICD-10-CM | POA: Diagnosis not present

## 2022-02-26 DIAGNOSIS — I739 Peripheral vascular disease, unspecified: Secondary | ICD-10-CM | POA: Diagnosis not present

## 2022-03-03 DIAGNOSIS — F0150 Vascular dementia without behavioral disturbance: Secondary | ICD-10-CM | POA: Diagnosis not present

## 2022-03-03 DIAGNOSIS — I739 Peripheral vascular disease, unspecified: Secondary | ICD-10-CM | POA: Diagnosis not present

## 2022-03-03 DIAGNOSIS — M1990 Unspecified osteoarthritis, unspecified site: Secondary | ICD-10-CM | POA: Diagnosis not present

## 2022-03-03 DIAGNOSIS — E785 Hyperlipidemia, unspecified: Secondary | ICD-10-CM | POA: Diagnosis not present

## 2022-03-03 DIAGNOSIS — I69818 Other symptoms and signs involving cognitive functions following other cerebrovascular disease: Secondary | ICD-10-CM | POA: Diagnosis not present

## 2022-03-03 DIAGNOSIS — R634 Abnormal weight loss: Secondary | ICD-10-CM | POA: Diagnosis not present

## 2022-03-04 DIAGNOSIS — I69818 Other symptoms and signs involving cognitive functions following other cerebrovascular disease: Secondary | ICD-10-CM | POA: Diagnosis not present

## 2022-03-04 DIAGNOSIS — I739 Peripheral vascular disease, unspecified: Secondary | ICD-10-CM | POA: Diagnosis not present

## 2022-03-04 DIAGNOSIS — M1990 Unspecified osteoarthritis, unspecified site: Secondary | ICD-10-CM | POA: Diagnosis not present

## 2022-03-04 DIAGNOSIS — E785 Hyperlipidemia, unspecified: Secondary | ICD-10-CM | POA: Diagnosis not present

## 2022-03-04 DIAGNOSIS — F0150 Vascular dementia without behavioral disturbance: Secondary | ICD-10-CM | POA: Diagnosis not present

## 2022-03-04 DIAGNOSIS — R634 Abnormal weight loss: Secondary | ICD-10-CM | POA: Diagnosis not present

## 2022-03-05 DIAGNOSIS — M1990 Unspecified osteoarthritis, unspecified site: Secondary | ICD-10-CM | POA: Diagnosis not present

## 2022-03-05 DIAGNOSIS — F0150 Vascular dementia without behavioral disturbance: Secondary | ICD-10-CM | POA: Diagnosis not present

## 2022-03-05 DIAGNOSIS — I739 Peripheral vascular disease, unspecified: Secondary | ICD-10-CM | POA: Diagnosis not present

## 2022-03-05 DIAGNOSIS — I69818 Other symptoms and signs involving cognitive functions following other cerebrovascular disease: Secondary | ICD-10-CM | POA: Diagnosis not present

## 2022-03-05 DIAGNOSIS — R634 Abnormal weight loss: Secondary | ICD-10-CM | POA: Diagnosis not present

## 2022-03-05 DIAGNOSIS — E785 Hyperlipidemia, unspecified: Secondary | ICD-10-CM | POA: Diagnosis not present

## 2022-03-10 DIAGNOSIS — M1990 Unspecified osteoarthritis, unspecified site: Secondary | ICD-10-CM | POA: Diagnosis not present

## 2022-03-10 DIAGNOSIS — E785 Hyperlipidemia, unspecified: Secondary | ICD-10-CM | POA: Diagnosis not present

## 2022-03-10 DIAGNOSIS — I739 Peripheral vascular disease, unspecified: Secondary | ICD-10-CM | POA: Diagnosis not present

## 2022-03-10 DIAGNOSIS — F0150 Vascular dementia without behavioral disturbance: Secondary | ICD-10-CM | POA: Diagnosis not present

## 2022-03-10 DIAGNOSIS — R634 Abnormal weight loss: Secondary | ICD-10-CM | POA: Diagnosis not present

## 2022-03-10 DIAGNOSIS — I69818 Other symptoms and signs involving cognitive functions following other cerebrovascular disease: Secondary | ICD-10-CM | POA: Diagnosis not present

## 2022-03-12 DIAGNOSIS — I69818 Other symptoms and signs involving cognitive functions following other cerebrovascular disease: Secondary | ICD-10-CM | POA: Diagnosis not present

## 2022-03-12 DIAGNOSIS — M1990 Unspecified osteoarthritis, unspecified site: Secondary | ICD-10-CM | POA: Diagnosis not present

## 2022-03-12 DIAGNOSIS — I739 Peripheral vascular disease, unspecified: Secondary | ICD-10-CM | POA: Diagnosis not present

## 2022-03-12 DIAGNOSIS — E785 Hyperlipidemia, unspecified: Secondary | ICD-10-CM | POA: Diagnosis not present

## 2022-03-12 DIAGNOSIS — F0150 Vascular dementia without behavioral disturbance: Secondary | ICD-10-CM | POA: Diagnosis not present

## 2022-03-12 DIAGNOSIS — R634 Abnormal weight loss: Secondary | ICD-10-CM | POA: Diagnosis not present

## 2022-03-14 DIAGNOSIS — I69818 Other symptoms and signs involving cognitive functions following other cerebrovascular disease: Secondary | ICD-10-CM | POA: Diagnosis not present

## 2022-03-14 DIAGNOSIS — R634 Abnormal weight loss: Secondary | ICD-10-CM | POA: Diagnosis not present

## 2022-03-14 DIAGNOSIS — M1990 Unspecified osteoarthritis, unspecified site: Secondary | ICD-10-CM | POA: Diagnosis not present

## 2022-03-14 DIAGNOSIS — I739 Peripheral vascular disease, unspecified: Secondary | ICD-10-CM | POA: Diagnosis not present

## 2022-03-14 DIAGNOSIS — F0150 Vascular dementia without behavioral disturbance: Secondary | ICD-10-CM | POA: Diagnosis not present

## 2022-03-14 DIAGNOSIS — E785 Hyperlipidemia, unspecified: Secondary | ICD-10-CM | POA: Diagnosis not present

## 2022-03-17 DIAGNOSIS — M1990 Unspecified osteoarthritis, unspecified site: Secondary | ICD-10-CM | POA: Diagnosis not present

## 2022-03-17 DIAGNOSIS — I69818 Other symptoms and signs involving cognitive functions following other cerebrovascular disease: Secondary | ICD-10-CM | POA: Diagnosis not present

## 2022-03-17 DIAGNOSIS — R634 Abnormal weight loss: Secondary | ICD-10-CM | POA: Diagnosis not present

## 2022-03-17 DIAGNOSIS — E785 Hyperlipidemia, unspecified: Secondary | ICD-10-CM | POA: Diagnosis not present

## 2022-03-17 DIAGNOSIS — I739 Peripheral vascular disease, unspecified: Secondary | ICD-10-CM | POA: Diagnosis not present

## 2022-03-17 DIAGNOSIS — F0150 Vascular dementia without behavioral disturbance: Secondary | ICD-10-CM | POA: Diagnosis not present

## 2022-03-19 DIAGNOSIS — M1990 Unspecified osteoarthritis, unspecified site: Secondary | ICD-10-CM | POA: Diagnosis not present

## 2022-03-19 DIAGNOSIS — E785 Hyperlipidemia, unspecified: Secondary | ICD-10-CM | POA: Diagnosis not present

## 2022-03-19 DIAGNOSIS — I739 Peripheral vascular disease, unspecified: Secondary | ICD-10-CM | POA: Diagnosis not present

## 2022-03-19 DIAGNOSIS — F0150 Vascular dementia without behavioral disturbance: Secondary | ICD-10-CM | POA: Diagnosis not present

## 2022-03-19 DIAGNOSIS — R634 Abnormal weight loss: Secondary | ICD-10-CM | POA: Diagnosis not present

## 2022-03-19 DIAGNOSIS — I69818 Other symptoms and signs involving cognitive functions following other cerebrovascular disease: Secondary | ICD-10-CM | POA: Diagnosis not present

## 2022-03-21 DIAGNOSIS — K219 Gastro-esophageal reflux disease without esophagitis: Secondary | ICD-10-CM | POA: Diagnosis not present

## 2022-03-21 DIAGNOSIS — E785 Hyperlipidemia, unspecified: Secondary | ICD-10-CM | POA: Diagnosis not present

## 2022-03-21 DIAGNOSIS — I69818 Other symptoms and signs involving cognitive functions following other cerebrovascular disease: Secondary | ICD-10-CM | POA: Diagnosis not present

## 2022-03-21 DIAGNOSIS — I739 Peripheral vascular disease, unspecified: Secondary | ICD-10-CM | POA: Diagnosis not present

## 2022-03-21 DIAGNOSIS — R634 Abnormal weight loss: Secondary | ICD-10-CM | POA: Diagnosis not present

## 2022-03-21 DIAGNOSIS — J309 Allergic rhinitis, unspecified: Secondary | ICD-10-CM | POA: Diagnosis not present

## 2022-03-21 DIAGNOSIS — I252 Old myocardial infarction: Secondary | ICD-10-CM | POA: Diagnosis not present

## 2022-03-21 DIAGNOSIS — F0150 Vascular dementia without behavioral disturbance: Secondary | ICD-10-CM | POA: Diagnosis not present

## 2022-03-21 DIAGNOSIS — I10 Essential (primary) hypertension: Secondary | ICD-10-CM | POA: Diagnosis not present

## 2022-03-21 DIAGNOSIS — M1990 Unspecified osteoarthritis, unspecified site: Secondary | ICD-10-CM | POA: Diagnosis not present

## 2022-03-21 DIAGNOSIS — E039 Hypothyroidism, unspecified: Secondary | ICD-10-CM | POA: Diagnosis not present

## 2022-03-21 DIAGNOSIS — Z8673 Personal history of transient ischemic attack (TIA), and cerebral infarction without residual deficits: Secondary | ICD-10-CM | POA: Diagnosis not present

## 2022-03-21 DIAGNOSIS — Z681 Body mass index (BMI) 19 or less, adult: Secondary | ICD-10-CM | POA: Diagnosis not present

## 2022-03-24 DIAGNOSIS — F0150 Vascular dementia without behavioral disturbance: Secondary | ICD-10-CM | POA: Diagnosis not present

## 2022-03-24 DIAGNOSIS — M1990 Unspecified osteoarthritis, unspecified site: Secondary | ICD-10-CM | POA: Diagnosis not present

## 2022-03-24 DIAGNOSIS — R634 Abnormal weight loss: Secondary | ICD-10-CM | POA: Diagnosis not present

## 2022-03-24 DIAGNOSIS — I739 Peripheral vascular disease, unspecified: Secondary | ICD-10-CM | POA: Diagnosis not present

## 2022-03-24 DIAGNOSIS — I69818 Other symptoms and signs involving cognitive functions following other cerebrovascular disease: Secondary | ICD-10-CM | POA: Diagnosis not present

## 2022-03-24 DIAGNOSIS — E785 Hyperlipidemia, unspecified: Secondary | ICD-10-CM | POA: Diagnosis not present

## 2022-03-26 DIAGNOSIS — M1990 Unspecified osteoarthritis, unspecified site: Secondary | ICD-10-CM | POA: Diagnosis not present

## 2022-03-26 DIAGNOSIS — F0150 Vascular dementia without behavioral disturbance: Secondary | ICD-10-CM | POA: Diagnosis not present

## 2022-03-26 DIAGNOSIS — I739 Peripheral vascular disease, unspecified: Secondary | ICD-10-CM | POA: Diagnosis not present

## 2022-03-26 DIAGNOSIS — R634 Abnormal weight loss: Secondary | ICD-10-CM | POA: Diagnosis not present

## 2022-03-26 DIAGNOSIS — E785 Hyperlipidemia, unspecified: Secondary | ICD-10-CM | POA: Diagnosis not present

## 2022-03-26 DIAGNOSIS — I69818 Other symptoms and signs involving cognitive functions following other cerebrovascular disease: Secondary | ICD-10-CM | POA: Diagnosis not present

## 2022-03-31 DIAGNOSIS — E785 Hyperlipidemia, unspecified: Secondary | ICD-10-CM | POA: Diagnosis not present

## 2022-03-31 DIAGNOSIS — I69818 Other symptoms and signs involving cognitive functions following other cerebrovascular disease: Secondary | ICD-10-CM | POA: Diagnosis not present

## 2022-03-31 DIAGNOSIS — F0150 Vascular dementia without behavioral disturbance: Secondary | ICD-10-CM | POA: Diagnosis not present

## 2022-03-31 DIAGNOSIS — M1990 Unspecified osteoarthritis, unspecified site: Secondary | ICD-10-CM | POA: Diagnosis not present

## 2022-03-31 DIAGNOSIS — R634 Abnormal weight loss: Secondary | ICD-10-CM | POA: Diagnosis not present

## 2022-03-31 DIAGNOSIS — I739 Peripheral vascular disease, unspecified: Secondary | ICD-10-CM | POA: Diagnosis not present

## 2022-04-01 DIAGNOSIS — F0150 Vascular dementia without behavioral disturbance: Secondary | ICD-10-CM | POA: Diagnosis not present

## 2022-04-01 DIAGNOSIS — I739 Peripheral vascular disease, unspecified: Secondary | ICD-10-CM | POA: Diagnosis not present

## 2022-04-01 DIAGNOSIS — E785 Hyperlipidemia, unspecified: Secondary | ICD-10-CM | POA: Diagnosis not present

## 2022-04-01 DIAGNOSIS — M1990 Unspecified osteoarthritis, unspecified site: Secondary | ICD-10-CM | POA: Diagnosis not present

## 2022-04-01 DIAGNOSIS — R634 Abnormal weight loss: Secondary | ICD-10-CM | POA: Diagnosis not present

## 2022-04-01 DIAGNOSIS — I69818 Other symptoms and signs involving cognitive functions following other cerebrovascular disease: Secondary | ICD-10-CM | POA: Diagnosis not present

## 2022-04-02 DIAGNOSIS — I739 Peripheral vascular disease, unspecified: Secondary | ICD-10-CM | POA: Diagnosis not present

## 2022-04-02 DIAGNOSIS — I69818 Other symptoms and signs involving cognitive functions following other cerebrovascular disease: Secondary | ICD-10-CM | POA: Diagnosis not present

## 2022-04-02 DIAGNOSIS — F0150 Vascular dementia without behavioral disturbance: Secondary | ICD-10-CM | POA: Diagnosis not present

## 2022-04-02 DIAGNOSIS — M1990 Unspecified osteoarthritis, unspecified site: Secondary | ICD-10-CM | POA: Diagnosis not present

## 2022-04-02 DIAGNOSIS — R634 Abnormal weight loss: Secondary | ICD-10-CM | POA: Diagnosis not present

## 2022-04-02 DIAGNOSIS — E785 Hyperlipidemia, unspecified: Secondary | ICD-10-CM | POA: Diagnosis not present

## 2022-04-07 DIAGNOSIS — R634 Abnormal weight loss: Secondary | ICD-10-CM | POA: Diagnosis not present

## 2022-04-07 DIAGNOSIS — B950 Streptococcus, group A, as the cause of diseases classified elsewhere: Secondary | ICD-10-CM | POA: Diagnosis not present

## 2022-04-07 DIAGNOSIS — I69818 Other symptoms and signs involving cognitive functions following other cerebrovascular disease: Secondary | ICD-10-CM | POA: Diagnosis not present

## 2022-04-07 DIAGNOSIS — F0150 Vascular dementia without behavioral disturbance: Secondary | ICD-10-CM | POA: Diagnosis not present

## 2022-04-07 DIAGNOSIS — E785 Hyperlipidemia, unspecified: Secondary | ICD-10-CM | POA: Diagnosis not present

## 2022-04-07 DIAGNOSIS — I739 Peripheral vascular disease, unspecified: Secondary | ICD-10-CM | POA: Diagnosis not present

## 2022-04-07 DIAGNOSIS — Z20818 Contact with and (suspected) exposure to other bacterial communicable diseases: Secondary | ICD-10-CM | POA: Diagnosis not present

## 2022-04-07 DIAGNOSIS — M1990 Unspecified osteoarthritis, unspecified site: Secondary | ICD-10-CM | POA: Diagnosis not present

## 2022-04-08 DIAGNOSIS — M1990 Unspecified osteoarthritis, unspecified site: Secondary | ICD-10-CM | POA: Diagnosis not present

## 2022-04-08 DIAGNOSIS — E785 Hyperlipidemia, unspecified: Secondary | ICD-10-CM | POA: Diagnosis not present

## 2022-04-08 DIAGNOSIS — R634 Abnormal weight loss: Secondary | ICD-10-CM | POA: Diagnosis not present

## 2022-04-08 DIAGNOSIS — I69818 Other symptoms and signs involving cognitive functions following other cerebrovascular disease: Secondary | ICD-10-CM | POA: Diagnosis not present

## 2022-04-08 DIAGNOSIS — I739 Peripheral vascular disease, unspecified: Secondary | ICD-10-CM | POA: Diagnosis not present

## 2022-04-08 DIAGNOSIS — F0150 Vascular dementia without behavioral disturbance: Secondary | ICD-10-CM | POA: Diagnosis not present

## 2022-04-09 DIAGNOSIS — M1990 Unspecified osteoarthritis, unspecified site: Secondary | ICD-10-CM | POA: Diagnosis not present

## 2022-04-09 DIAGNOSIS — F0150 Vascular dementia without behavioral disturbance: Secondary | ICD-10-CM | POA: Diagnosis not present

## 2022-04-09 DIAGNOSIS — I739 Peripheral vascular disease, unspecified: Secondary | ICD-10-CM | POA: Diagnosis not present

## 2022-04-09 DIAGNOSIS — I69818 Other symptoms and signs involving cognitive functions following other cerebrovascular disease: Secondary | ICD-10-CM | POA: Diagnosis not present

## 2022-04-09 DIAGNOSIS — E785 Hyperlipidemia, unspecified: Secondary | ICD-10-CM | POA: Diagnosis not present

## 2022-04-09 DIAGNOSIS — R634 Abnormal weight loss: Secondary | ICD-10-CM | POA: Diagnosis not present

## 2022-04-14 DIAGNOSIS — M1990 Unspecified osteoarthritis, unspecified site: Secondary | ICD-10-CM | POA: Diagnosis not present

## 2022-04-14 DIAGNOSIS — E785 Hyperlipidemia, unspecified: Secondary | ICD-10-CM | POA: Diagnosis not present

## 2022-04-14 DIAGNOSIS — I739 Peripheral vascular disease, unspecified: Secondary | ICD-10-CM | POA: Diagnosis not present

## 2022-04-14 DIAGNOSIS — R634 Abnormal weight loss: Secondary | ICD-10-CM | POA: Diagnosis not present

## 2022-04-14 DIAGNOSIS — F0150 Vascular dementia without behavioral disturbance: Secondary | ICD-10-CM | POA: Diagnosis not present

## 2022-04-14 DIAGNOSIS — I69818 Other symptoms and signs involving cognitive functions following other cerebrovascular disease: Secondary | ICD-10-CM | POA: Diagnosis not present

## 2022-04-15 DIAGNOSIS — R634 Abnormal weight loss: Secondary | ICD-10-CM | POA: Diagnosis not present

## 2022-04-15 DIAGNOSIS — E785 Hyperlipidemia, unspecified: Secondary | ICD-10-CM | POA: Diagnosis not present

## 2022-04-15 DIAGNOSIS — F0150 Vascular dementia without behavioral disturbance: Secondary | ICD-10-CM | POA: Diagnosis not present

## 2022-04-15 DIAGNOSIS — I739 Peripheral vascular disease, unspecified: Secondary | ICD-10-CM | POA: Diagnosis not present

## 2022-04-15 DIAGNOSIS — I69818 Other symptoms and signs involving cognitive functions following other cerebrovascular disease: Secondary | ICD-10-CM | POA: Diagnosis not present

## 2022-04-15 DIAGNOSIS — M1990 Unspecified osteoarthritis, unspecified site: Secondary | ICD-10-CM | POA: Diagnosis not present

## 2022-04-16 DIAGNOSIS — E785 Hyperlipidemia, unspecified: Secondary | ICD-10-CM | POA: Diagnosis not present

## 2022-04-16 DIAGNOSIS — I739 Peripheral vascular disease, unspecified: Secondary | ICD-10-CM | POA: Diagnosis not present

## 2022-04-16 DIAGNOSIS — I69818 Other symptoms and signs involving cognitive functions following other cerebrovascular disease: Secondary | ICD-10-CM | POA: Diagnosis not present

## 2022-04-16 DIAGNOSIS — R634 Abnormal weight loss: Secondary | ICD-10-CM | POA: Diagnosis not present

## 2022-04-16 DIAGNOSIS — M1990 Unspecified osteoarthritis, unspecified site: Secondary | ICD-10-CM | POA: Diagnosis not present

## 2022-04-16 DIAGNOSIS — F0150 Vascular dementia without behavioral disturbance: Secondary | ICD-10-CM | POA: Diagnosis not present

## 2022-04-21 DIAGNOSIS — G4700 Insomnia, unspecified: Secondary | ICD-10-CM | POA: Diagnosis not present

## 2022-04-21 DIAGNOSIS — E785 Hyperlipidemia, unspecified: Secondary | ICD-10-CM | POA: Diagnosis not present

## 2022-04-21 DIAGNOSIS — R627 Adult failure to thrive: Secondary | ICD-10-CM | POA: Diagnosis not present

## 2022-04-21 DIAGNOSIS — Z8619 Personal history of other infectious and parasitic diseases: Secondary | ICD-10-CM | POA: Diagnosis not present

## 2022-04-21 DIAGNOSIS — M1990 Unspecified osteoarthritis, unspecified site: Secondary | ICD-10-CM | POA: Diagnosis not present

## 2022-04-21 DIAGNOSIS — J309 Allergic rhinitis, unspecified: Secondary | ICD-10-CM | POA: Diagnosis not present

## 2022-04-21 DIAGNOSIS — R634 Abnormal weight loss: Secondary | ICD-10-CM | POA: Diagnosis not present

## 2022-04-21 DIAGNOSIS — I252 Old myocardial infarction: Secondary | ICD-10-CM | POA: Diagnosis not present

## 2022-04-21 DIAGNOSIS — I2510 Atherosclerotic heart disease of native coronary artery without angina pectoris: Secondary | ICD-10-CM | POA: Diagnosis not present

## 2022-04-21 DIAGNOSIS — I69891 Dysphagia following other cerebrovascular disease: Secondary | ICD-10-CM | POA: Diagnosis not present

## 2022-04-21 DIAGNOSIS — Z681 Body mass index (BMI) 19 or less, adult: Secondary | ICD-10-CM | POA: Diagnosis not present

## 2022-04-21 DIAGNOSIS — K219 Gastro-esophageal reflux disease without esophagitis: Secondary | ICD-10-CM | POA: Diagnosis not present

## 2022-04-21 DIAGNOSIS — E039 Hypothyroidism, unspecified: Secondary | ICD-10-CM | POA: Diagnosis not present

## 2022-04-21 DIAGNOSIS — I69818 Other symptoms and signs involving cognitive functions following other cerebrovascular disease: Secondary | ICD-10-CM | POA: Diagnosis not present

## 2022-04-21 DIAGNOSIS — Z8673 Personal history of transient ischemic attack (TIA), and cerebral infarction without residual deficits: Secondary | ICD-10-CM | POA: Diagnosis not present

## 2022-04-21 DIAGNOSIS — R159 Full incontinence of feces: Secondary | ICD-10-CM | POA: Diagnosis not present

## 2022-04-21 DIAGNOSIS — I10 Essential (primary) hypertension: Secondary | ICD-10-CM | POA: Diagnosis not present

## 2022-04-21 DIAGNOSIS — R32 Unspecified urinary incontinence: Secondary | ICD-10-CM | POA: Diagnosis not present

## 2022-04-21 DIAGNOSIS — F0152 Vascular dementia, unspecified severity, with psychotic disturbance: Secondary | ICD-10-CM | POA: Diagnosis not present

## 2022-04-21 DIAGNOSIS — I739 Peripheral vascular disease, unspecified: Secondary | ICD-10-CM | POA: Diagnosis not present

## 2022-04-21 DIAGNOSIS — Z741 Need for assistance with personal care: Secondary | ICD-10-CM | POA: Diagnosis not present

## 2022-04-22 DIAGNOSIS — M1990 Unspecified osteoarthritis, unspecified site: Secondary | ICD-10-CM | POA: Diagnosis not present

## 2022-04-22 DIAGNOSIS — F0152 Vascular dementia, unspecified severity, with psychotic disturbance: Secondary | ICD-10-CM | POA: Diagnosis not present

## 2022-04-22 DIAGNOSIS — E785 Hyperlipidemia, unspecified: Secondary | ICD-10-CM | POA: Diagnosis not present

## 2022-04-22 DIAGNOSIS — I69818 Other symptoms and signs involving cognitive functions following other cerebrovascular disease: Secondary | ICD-10-CM | POA: Diagnosis not present

## 2022-04-22 DIAGNOSIS — I739 Peripheral vascular disease, unspecified: Secondary | ICD-10-CM | POA: Diagnosis not present

## 2022-04-22 DIAGNOSIS — I69891 Dysphagia following other cerebrovascular disease: Secondary | ICD-10-CM | POA: Diagnosis not present

## 2022-04-23 ENCOUNTER — Non-Acute Institutional Stay (SKILLED_NURSING_FACILITY): Payer: Medicare Other | Admitting: Student

## 2022-04-23 ENCOUNTER — Encounter: Payer: Self-pay | Admitting: Student

## 2022-04-23 DIAGNOSIS — I10 Essential (primary) hypertension: Secondary | ICD-10-CM | POA: Diagnosis not present

## 2022-04-23 DIAGNOSIS — E1165 Type 2 diabetes mellitus with hyperglycemia: Secondary | ICD-10-CM | POA: Diagnosis not present

## 2022-04-23 DIAGNOSIS — F0152 Vascular dementia, unspecified severity, with psychotic disturbance: Secondary | ICD-10-CM | POA: Diagnosis not present

## 2022-04-23 DIAGNOSIS — I69818 Other symptoms and signs involving cognitive functions following other cerebrovascular disease: Secondary | ICD-10-CM | POA: Diagnosis not present

## 2022-04-23 DIAGNOSIS — E785 Hyperlipidemia, unspecified: Secondary | ICD-10-CM | POA: Diagnosis not present

## 2022-04-23 DIAGNOSIS — I69891 Dysphagia following other cerebrovascular disease: Secondary | ICD-10-CM | POA: Diagnosis not present

## 2022-04-23 DIAGNOSIS — F0280 Dementia in other diseases classified elsewhere without behavioral disturbance: Secondary | ICD-10-CM

## 2022-04-23 DIAGNOSIS — R1314 Dysphagia, pharyngoesophageal phase: Secondary | ICD-10-CM | POA: Diagnosis not present

## 2022-04-23 DIAGNOSIS — G309 Alzheimer's disease, unspecified: Secondary | ICD-10-CM

## 2022-04-23 DIAGNOSIS — E039 Hypothyroidism, unspecified: Secondary | ICD-10-CM | POA: Diagnosis not present

## 2022-04-23 DIAGNOSIS — M1990 Unspecified osteoarthritis, unspecified site: Secondary | ICD-10-CM | POA: Diagnosis not present

## 2022-04-23 DIAGNOSIS — I739 Peripheral vascular disease, unspecified: Secondary | ICD-10-CM | POA: Diagnosis not present

## 2022-04-23 NOTE — Progress Notes (Unsigned)
Location:  Other [place].  Nursing Home Room Number: [facility] [address]A Place of Service:  SNF [phone]) Provider:  Dewayne Shorter, MD  Patient Care Team: Dewayne Shorter, MD as PCP - General [facility] Medicine)  Extended Emergency Contact Information Primary Emergency Contact: [facility] Address: [address]          [place], [place] [address] [name] of [place] Phone: [phone] Mobile Phone: [phone] Relation: Daughter Secondary Emergency Contact: Tawni Levy Address: [address]          [place],  [address] [name] of [place] Phone: [phone] Mobile Phone: [phone] Relation: Daughter  Code Status:  DNR Goals of care: Advanced Directive information    04/23/2022   10:03 AM  Advanced Directives  Does Patient Have a Medical Advance Directive? Yes  Type of Paramedic of [place];Living will;Out of facility DNR (pink MOST or yellow form)  Does patient want to make changes to medical advance directive? No - Patient declined  Copy of [place] in Chart? Yes - validated most recent copy scanned in chart (See row information)     Chief Complaint  Patient presents with   Medical Management of Chronic Issues    Medical Management of Chronic Issues.     HPI:  Pt is a 89 y.o. female seen today for medical management of chronic diseases.    Nursing without concerns at this time.   Per Nutritionist on campus Down 7.9% x 3 months, and down 16.1% x 6 months (both significant). Continued decline. On hospice. ENSURE CLEAR 1X A DAY; THICKEN TO NECTAR CONSISTENCY  Patient smiles and reaches out her hand to say how nice it is to meet me.   Past Medical History:  Diagnosis Date   Arthritis    Bleeding disorder    Chronic cystitis    Collagen vascular disease    CVA (cerebral infarction) 2003   Dysuria    Gross hematuria    Hematuria    Hemihypertrophy    History of kidney stones    Hyperlipidemia     Hypertension    Murmur, cardiac    Myocardial infarction    Peripheral vascular disease    s/p stent right leg   Pulmonary nodule    stable on Chest CT March 2014, Follow[facility] at [facility]   Recurrent UTI    Sensory urge incontinence    Thyroid disease    Urinary frequency    Past Surgical History:  Procedure Laterality Date   ABDOMINAL HYSTERECTOMY  1982   APPENDECTOMY     CARDIAC SURGERY     2 failed stents in right leg   [place]  2007   replaced ball in right hip   left rotator cuff     repair   ruptured disc     SPINE SURGERY     steel rod in back   stent placement leg Right 2015   TONSILLECTOMY     TOTAL HIP ARTHROPLASTY     right   TOTAL KNEE ARTHROPLASTY Left     Allergies  Allergen Reactions   Levaquin [Levofloxacin]    Sulfa Antibiotics Hives and Itching   Ciprofloxacin Hives and Itching   Doxycycline Hives and Itching   Ferrous Sulfate Hives and Itching   Penicillins Hives and Itching    .Has patient had a PCN reaction causing immediate rash, facial/tongue/throat swelling, SOB or lightheadedness with hypotension: Unknown Has patient had a PCN reaction  causing severe rash involving mucus membranes or skin necrosis: Unknown Has patient had a PCN reaction that required hospitalization: Unknown Has patient had a PCN reaction occurring within the last 10 years: Unknown If all of the above answers are "NO", then may proceed with Cephalosporin use.     Outpatient Encounter Medications as of 04/23/2022  Medication Sig   acetaminophen (TYLENOL) 325 MG tablet Take 650 mg by mouth every 4 (four) hours as needed.   apixaban (ELIQUIS) 2.5 MG TABS tablet Take 2.5 mg by mouth 2 (two) times daily.   benzocaine (ORAJEL) 10 % mucosal gel Use as directed in the mouth or throat 3 (three) times daily as needed for mouth pain.   cetirizine (ZYRTEC) 10 MG tablet Take 10 mg by mouth daily.   dextromethorphan-guaiFENesin (ROBITUSSIN-DM) 10-100 MG/5ML liquid  Take 10 mLs by mouth every 4 (four) hours as needed for cough.   feeding supplement (ENSURE ENLIVE / ENSURE PLUS) LIQD Take 237 mLs by mouth 3 (three) times daily between meals.   hydrocortisone cream 1 % Apply 1 Application topically 2 (two) times daily as needed for itching.   Infant Care Products (DERMACLOUD EX) Nystatin Cream 3:1 every shift for yeast.   levothyroxine (SYNTHROID, LEVOTHROID) 25 MCG tablet TAKE 1 TABLET (25 MCG TOTAL) BY MOUTH DAILY.   Melatonin 5 MG CAPS Take 1 capsule by mouth at bedtime as needed.   nystatin powder Apply 1 Application topically 2 (two) times daily as needed.   phenazopyridine (PYRIDIUM) 100 MG tablet Take 100 mg by mouth 2 (two) times daily as needed for pain.   senna (SENOKOT) 8.6 MG TABS tablet Take 2 tablets by mouth daily.   No facility-administered encounter medications on file as of 04/23/2022.    Review of Systems  Immunization History  Administered Date(s) Administered   Influenza Split 10/26/2010, 01/26/2012, 09/20/2013   Influenza-Unspecified 11/30/2012, 11/05/2020, 11/05/2021   Moderna Sars-Covid-2 Vaccination 02/28/2019, 03/28/2019   Pneumococcal Conjugate-13 12/21/2007   Pneumococcal Polysaccharide-23 12/01/2007   Pneumococcal-Unspecified 11/22/2007   Unspecified SARS-COV-2 Vaccination 12/02/2019, 06/08/2020, 10/12/2020   Pertinent  Health Maintenance Due  Topic Date Due   HEMOGLOBIN A1C  11/12/2016   INFLUENZA VACCINE  08/21/2022   DEXA SCAN  Completed   FOOT EXAM  Discontinued   OPHTHALMOLOGY EXAM  Discontinued      12/28/2021    9:00 AM 12/28/2021   10:22 PM 12/29/2021   11:25 AM 12/29/2021   10:05 PM 12/30/2021    7:15 AM  Fall Risk  (RETIRED) Patient Fall Risk Level High fall risk High fall risk High fall risk High fall risk High fall risk   Functional Status Survey:    Vitals:   04/23/22 0957  BP: (!) 107/58  Pulse: 87  Resp: 17  Temp: (!) 97.3 F (36.3 C)  SpO2: 92%  Weight: 113 lb 9.6 oz (51.5 kg)  Height:  5\' 5"  (1.651 m)   Body mass index is 18.9 kg/m. Physical Exam Constitutional:      Comments: Thin, frail, in a gerichair  HENT:     Head:     Comments: Bilateral temporal muscle wasting. Loss of buccal fat.  Cardiovascular:     Pulses: Normal pulses.  Pulmonary:     Effort: Pulmonary effort is normal.  Neurological:     General: No focal deficit present.     Mental Status: She is disoriented.    Labs reviewed: Recent Labs    12/27/21 0743 12/29/21 0605 12/30/21 0510  NA 137 138  140  K 4.2 3.8 4.1  CL 105 106 106  CO2 27 28 27   GLUCOSE 107* 100* 105*  BUN 10 6* 10  CREATININE 0.67 0.55 0.60  CALCIUM 9.2 9.2 9.3  MG 1.8 1.9 2.1  PHOS 2.7 2.0* 3.1   Recent Labs    11/21/21 0000 12/26/21 0519  AST 12* 19  ALT 6* 10  ALKPHOS 41 54  BILITOT  --  0.7  PROT  --  6.9  ALBUMIN 3.5 3.2*   Recent Labs    12/26/21 0519 12/27/21 0743 12/29/21 0605 12/30/21 0510  WBC 19.9* 17.7* 8.4 7.7  NEUTROABS 16.2*  --   --   --   HGB 12.6 11.3* 11.6* 11.9*  HCT 39.9 35.2* 36.2 37.2  MCV 96.6 95.4 96.3 96.1  PLT 343 301 337 380   Lab Results  Component Value Date   TSH 2.67 11/20/2021   Lab Results  Component Value Date   HGBA1C 5.3 05/13/2016   Lab Results  Component Value Date   CHOL 176 11/21/2021   HDL 37 11/21/2021   LDLCALC 114 11/21/2021   TRIG 142 11/21/2021   CHOLHDL 4 08/11/2014    Significant Diagnostic Results in last 30 days:  No results found.  Assessment/Plan Alzheimer's disease  Hypothyroidism, unspecified type  Type 2 diabetes mellitus with hyperglycemia, without long-term current use of insulin  Essential (primary) hypertension  Dysphagia, pharyngoesophageal phase Patient has progressive alzheimer's disease. She has had significant weight loss over the last few months. She is currently on hospice. Patient continues to take her medications at this time. Will consider discontinuing medications. At this time, medications are with a goal of  maintaining comfort. Continue supportive care.    Family/ staff Communication: nursing  Labs/tests ordered:  none

## 2022-04-28 DIAGNOSIS — I739 Peripheral vascular disease, unspecified: Secondary | ICD-10-CM | POA: Diagnosis not present

## 2022-04-28 DIAGNOSIS — I69891 Dysphagia following other cerebrovascular disease: Secondary | ICD-10-CM | POA: Diagnosis not present

## 2022-04-28 DIAGNOSIS — I69818 Other symptoms and signs involving cognitive functions following other cerebrovascular disease: Secondary | ICD-10-CM | POA: Diagnosis not present

## 2022-04-28 DIAGNOSIS — M1990 Unspecified osteoarthritis, unspecified site: Secondary | ICD-10-CM | POA: Diagnosis not present

## 2022-04-28 DIAGNOSIS — F0152 Vascular dementia, unspecified severity, with psychotic disturbance: Secondary | ICD-10-CM | POA: Diagnosis not present

## 2022-04-28 DIAGNOSIS — E785 Hyperlipidemia, unspecified: Secondary | ICD-10-CM | POA: Diagnosis not present

## 2022-04-29 DIAGNOSIS — M1990 Unspecified osteoarthritis, unspecified site: Secondary | ICD-10-CM | POA: Diagnosis not present

## 2022-04-29 DIAGNOSIS — E785 Hyperlipidemia, unspecified: Secondary | ICD-10-CM | POA: Diagnosis not present

## 2022-04-29 DIAGNOSIS — F0152 Vascular dementia, unspecified severity, with psychotic disturbance: Secondary | ICD-10-CM | POA: Diagnosis not present

## 2022-04-29 DIAGNOSIS — I739 Peripheral vascular disease, unspecified: Secondary | ICD-10-CM | POA: Diagnosis not present

## 2022-04-29 DIAGNOSIS — I69891 Dysphagia following other cerebrovascular disease: Secondary | ICD-10-CM | POA: Diagnosis not present

## 2022-04-29 DIAGNOSIS — I69818 Other symptoms and signs involving cognitive functions following other cerebrovascular disease: Secondary | ICD-10-CM | POA: Diagnosis not present

## 2022-04-30 DIAGNOSIS — F0152 Vascular dementia, unspecified severity, with psychotic disturbance: Secondary | ICD-10-CM | POA: Diagnosis not present

## 2022-04-30 DIAGNOSIS — M1990 Unspecified osteoarthritis, unspecified site: Secondary | ICD-10-CM | POA: Diagnosis not present

## 2022-04-30 DIAGNOSIS — E785 Hyperlipidemia, unspecified: Secondary | ICD-10-CM | POA: Diagnosis not present

## 2022-04-30 DIAGNOSIS — I69818 Other symptoms and signs involving cognitive functions following other cerebrovascular disease: Secondary | ICD-10-CM | POA: Diagnosis not present

## 2022-04-30 DIAGNOSIS — I739 Peripheral vascular disease, unspecified: Secondary | ICD-10-CM | POA: Diagnosis not present

## 2022-04-30 DIAGNOSIS — I69891 Dysphagia following other cerebrovascular disease: Secondary | ICD-10-CM | POA: Diagnosis not present

## 2022-05-05 ENCOUNTER — Other Ambulatory Visit: Payer: Self-pay | Admitting: Nurse Practitioner

## 2022-05-05 DIAGNOSIS — M1990 Unspecified osteoarthritis, unspecified site: Secondary | ICD-10-CM | POA: Diagnosis not present

## 2022-05-05 DIAGNOSIS — Z515 Encounter for palliative care: Secondary | ICD-10-CM | POA: Insufficient documentation

## 2022-05-05 DIAGNOSIS — I69818 Other symptoms and signs involving cognitive functions following other cerebrovascular disease: Secondary | ICD-10-CM | POA: Diagnosis not present

## 2022-05-05 DIAGNOSIS — I69891 Dysphagia following other cerebrovascular disease: Secondary | ICD-10-CM | POA: Diagnosis not present

## 2022-05-05 DIAGNOSIS — F0152 Vascular dementia, unspecified severity, with psychotic disturbance: Secondary | ICD-10-CM | POA: Diagnosis not present

## 2022-05-05 DIAGNOSIS — I739 Peripheral vascular disease, unspecified: Secondary | ICD-10-CM | POA: Diagnosis not present

## 2022-05-05 DIAGNOSIS — E785 Hyperlipidemia, unspecified: Secondary | ICD-10-CM | POA: Diagnosis not present

## 2022-05-05 MED ORDER — LORAZEPAM 0.5 MG PO TABS: 0.5000 mg | ORAL_TABLET | Freq: Three times a day (TID) | ORAL | 0 refills | Status: DC

## 2022-05-05 MED ORDER — MORPHINE SULFATE (CONCENTRATE) 20 MG/ML PO SOLN: 5.0000 mg | ORAL | 0 refills | Status: DC | PRN

## 2022-05-06 DIAGNOSIS — I69891 Dysphagia following other cerebrovascular disease: Secondary | ICD-10-CM | POA: Diagnosis not present

## 2022-05-06 DIAGNOSIS — E785 Hyperlipidemia, unspecified: Secondary | ICD-10-CM | POA: Diagnosis not present

## 2022-05-06 DIAGNOSIS — I69818 Other symptoms and signs involving cognitive functions following other cerebrovascular disease: Secondary | ICD-10-CM | POA: Diagnosis not present

## 2022-05-06 DIAGNOSIS — I739 Peripheral vascular disease, unspecified: Secondary | ICD-10-CM | POA: Diagnosis not present

## 2022-05-06 DIAGNOSIS — M1990 Unspecified osteoarthritis, unspecified site: Secondary | ICD-10-CM | POA: Diagnosis not present

## 2022-05-06 DIAGNOSIS — F0152 Vascular dementia, unspecified severity, with psychotic disturbance: Secondary | ICD-10-CM | POA: Diagnosis not present

## 2022-05-07 ENCOUNTER — Other Ambulatory Visit: Payer: Self-pay | Admitting: Student

## 2022-05-07 DIAGNOSIS — F0152 Vascular dementia, unspecified severity, with psychotic disturbance: Secondary | ICD-10-CM | POA: Diagnosis not present

## 2022-05-07 DIAGNOSIS — I69818 Other symptoms and signs involving cognitive functions following other cerebrovascular disease: Secondary | ICD-10-CM | POA: Diagnosis not present

## 2022-05-07 DIAGNOSIS — I69891 Dysphagia following other cerebrovascular disease: Secondary | ICD-10-CM | POA: Diagnosis not present

## 2022-05-07 DIAGNOSIS — E785 Hyperlipidemia, unspecified: Secondary | ICD-10-CM | POA: Diagnosis not present

## 2022-05-07 DIAGNOSIS — I739 Peripheral vascular disease, unspecified: Secondary | ICD-10-CM | POA: Diagnosis not present

## 2022-05-07 DIAGNOSIS — M1990 Unspecified osteoarthritis, unspecified site: Secondary | ICD-10-CM | POA: Diagnosis not present

## 2022-05-07 DIAGNOSIS — Z515 Encounter for palliative care: Secondary | ICD-10-CM

## 2022-05-07 MED ORDER — MORPHINE SULFATE (CONCENTRATE) 20 MG/ML PO SOLN: 5.0000 mg | ORAL | 0 refills | Status: DC | PRN

## 2022-05-07 MED ORDER — LORAZEPAM 0.5 MG PO TABS: 0.5000 mg | ORAL_TABLET | Freq: Three times a day (TID) | ORAL | 0 refills | Status: AC

## 2022-05-07 NOTE — Progress Notes (Signed)
Refill of hospice meds.

## 2022-05-08 ENCOUNTER — Other Ambulatory Visit: Payer: Self-pay | Admitting: Nurse Practitioner

## 2022-05-08 DIAGNOSIS — I69891 Dysphagia following other cerebrovascular disease: Secondary | ICD-10-CM | POA: Diagnosis not present

## 2022-05-08 DIAGNOSIS — F0152 Vascular dementia, unspecified severity, with psychotic disturbance: Secondary | ICD-10-CM | POA: Diagnosis not present

## 2022-05-08 DIAGNOSIS — I69818 Other symptoms and signs involving cognitive functions following other cerebrovascular disease: Secondary | ICD-10-CM | POA: Diagnosis not present

## 2022-05-08 DIAGNOSIS — I739 Peripheral vascular disease, unspecified: Secondary | ICD-10-CM | POA: Diagnosis not present

## 2022-05-08 DIAGNOSIS — E785 Hyperlipidemia, unspecified: Secondary | ICD-10-CM | POA: Diagnosis not present

## 2022-05-08 DIAGNOSIS — M1990 Unspecified osteoarthritis, unspecified site: Secondary | ICD-10-CM | POA: Diagnosis not present

## 2022-05-09 DIAGNOSIS — I69891 Dysphagia following other cerebrovascular disease: Secondary | ICD-10-CM | POA: Diagnosis not present

## 2022-05-09 DIAGNOSIS — M1990 Unspecified osteoarthritis, unspecified site: Secondary | ICD-10-CM | POA: Diagnosis not present

## 2022-05-09 DIAGNOSIS — F0152 Vascular dementia, unspecified severity, with psychotic disturbance: Secondary | ICD-10-CM | POA: Diagnosis not present

## 2022-05-09 DIAGNOSIS — E785 Hyperlipidemia, unspecified: Secondary | ICD-10-CM | POA: Diagnosis not present

## 2022-05-09 DIAGNOSIS — I739 Peripheral vascular disease, unspecified: Secondary | ICD-10-CM | POA: Diagnosis not present

## 2022-05-09 DIAGNOSIS — I69818 Other symptoms and signs involving cognitive functions following other cerebrovascular disease: Secondary | ICD-10-CM | POA: Diagnosis not present

## 2022-05-10 DIAGNOSIS — I739 Peripheral vascular disease, unspecified: Secondary | ICD-10-CM | POA: Diagnosis not present

## 2022-05-10 DIAGNOSIS — F0152 Vascular dementia, unspecified severity, with psychotic disturbance: Secondary | ICD-10-CM | POA: Diagnosis not present

## 2022-05-10 DIAGNOSIS — M1990 Unspecified osteoarthritis, unspecified site: Secondary | ICD-10-CM | POA: Diagnosis not present

## 2022-05-10 DIAGNOSIS — I69818 Other symptoms and signs involving cognitive functions following other cerebrovascular disease: Secondary | ICD-10-CM | POA: Diagnosis not present

## 2022-05-10 DIAGNOSIS — E785 Hyperlipidemia, unspecified: Secondary | ICD-10-CM | POA: Diagnosis not present

## 2022-05-10 DIAGNOSIS — I69891 Dysphagia following other cerebrovascular disease: Secondary | ICD-10-CM | POA: Diagnosis not present

## 2022-05-11 DIAGNOSIS — I69891 Dysphagia following other cerebrovascular disease: Secondary | ICD-10-CM | POA: Diagnosis not present

## 2022-05-11 DIAGNOSIS — E785 Hyperlipidemia, unspecified: Secondary | ICD-10-CM | POA: Diagnosis not present

## 2022-05-11 DIAGNOSIS — M1990 Unspecified osteoarthritis, unspecified site: Secondary | ICD-10-CM | POA: Diagnosis not present

## 2022-05-11 DIAGNOSIS — F0152 Vascular dementia, unspecified severity, with psychotic disturbance: Secondary | ICD-10-CM | POA: Diagnosis not present

## 2022-05-11 DIAGNOSIS — I69818 Other symptoms and signs involving cognitive functions following other cerebrovascular disease: Secondary | ICD-10-CM | POA: Diagnosis not present

## 2022-05-11 DIAGNOSIS — I739 Peripheral vascular disease, unspecified: Secondary | ICD-10-CM | POA: Diagnosis not present

## 2022-05-12 ENCOUNTER — Encounter: Payer: Self-pay | Admitting: Student

## 2022-05-12 ENCOUNTER — Non-Acute Institutional Stay (SKILLED_NURSING_FACILITY): Payer: Medicare Other | Admitting: Student

## 2022-05-12 DIAGNOSIS — I69818 Other symptoms and signs involving cognitive functions following other cerebrovascular disease: Secondary | ICD-10-CM | POA: Diagnosis not present

## 2022-05-12 DIAGNOSIS — I69891 Dysphagia following other cerebrovascular disease: Secondary | ICD-10-CM | POA: Diagnosis not present

## 2022-05-12 DIAGNOSIS — E785 Hyperlipidemia, unspecified: Secondary | ICD-10-CM | POA: Diagnosis not present

## 2022-05-12 DIAGNOSIS — I739 Peripheral vascular disease, unspecified: Secondary | ICD-10-CM | POA: Diagnosis not present

## 2022-05-12 DIAGNOSIS — F0152 Vascular dementia, unspecified severity, with psychotic disturbance: Secondary | ICD-10-CM | POA: Diagnosis not present

## 2022-05-12 DIAGNOSIS — Z515 Encounter for palliative care: Secondary | ICD-10-CM

## 2022-05-12 DIAGNOSIS — M1990 Unspecified osteoarthritis, unspecified site: Secondary | ICD-10-CM | POA: Diagnosis not present

## 2022-05-12 NOTE — Progress Notes (Unsigned)
Location:  Other [place].  Nursing Home Room Number: [facility] 411A Place of Service:  SNF [phone]) Provider:  Earnestine Mealing, MD  Patient Care Team: Earnestine Mealing, MD as PCP - General [facility] Medicine)  Extended Emergency Contact Information Primary Emergency Contact: [facility] Address: [address]          [place], [place] [address] Darden Amber of [place] Home Phone: [phone] Mobile Phone: [phone] Relation: Daughter Secondary Emergency Contact: Eulah Pont Address: [address]          [place], [place] [address] Darden Amber of [place] Home Phone: [phone] Mobile Phone: [phone] Relation: Daughter  Code Status:  DNR Goals of care: Advanced Directive information    05/12/2022   10:12 AM  Advanced Directives  Does Patient Have a Medical Advance Directive? Yes  Type of Estate agent of [place];Out of facility DNR (pink MOST or yellow form);Living will  Does patient want to make changes to medical advance directive? No - Patient declined  Copy of Healthcare Power of Attorney in Chart? Yes - validated most recent copy scanned in chart (See row information)     Chief Complaint  Patient presents with   Acute Visit    Hospice Care.     HPI:  Pt is a 89 y.o. female seen today for an acute visit for hospice care. Daughters at bedside concerned about prognosis. She has had more time sleepy than alert. When her daughters are mentioned, she said, "And I love them both very much." They state this is the most she has said in days. They also have been giving her water on a sponge, but she hasn't eaten substantially in the last few days.    Past Medical History:  Diagnosis Date   Arthritis    Bleeding disorder    Chronic cystitis    Collagen vascular disease    CVA (cerebral infarction) 2003   Dysuria    Gross hematuria    Hematuria    Hemihypertrophy    History of kidney stones    Hyperlipidemia    Hypertension    Murmur, cardiac     Myocardial infarction    Peripheral vascular disease    s/p stent right leg   Pulmonary nodule    stable on Chest CT March 2014, Followed at [facility]   Recurrent UTI    Sensory urge incontinence    Thyroid disease    Urinary frequency    Past Surgical History:  Procedure Laterality Date   ABDOMINAL HYSTERECTOMY  1982   APPENDECTOMY     CARDIAC SURGERY     2 failed stents in right leg   CARPAL TUNNEL RELEASE     HIP SURGERY  2007   replaced ball in right hip   left rotator cuff     repair   ruptured disc     SPINE SURGERY     steel rod in back   stent placement leg Right 2015   TONSILLECTOMY     TOTAL HIP ARTHROPLASTY     right   TOTAL KNEE ARTHROPLASTY Left     Allergies  Allergen Reactions   Levaquin [Levofloxacin]    Sulfa Antibiotics Hives and Itching   Ciprofloxacin Hives and Itching   Doxycycline Hives and Itching   Ferrous Sulfate Hives and Itching   Penicillins Hives and Itching    .Has patient had a PCN reaction causing immediate rash, facial/tongue/throat swelling, SOB or lightheadedness with hypotension: Unknown Has patient had a PCN reaction causing severe rash  involving mucus membranes or skin necrosis: Unknown Has patient had a PCN reaction that required hospitalization: Unknown Has patient had a PCN reaction occurring within the last 10 years: Unknown If all of the above answers are "NO", then may proceed with Cephalosporin use.     Outpatient Encounter Medications as of 05/12/2022  Medication Sig   acetaminophen (TYLENOL) 325 MG tablet Take 650 mg by mouth every 4 (four) hours as needed.   apixaban (ELIQUIS) 2.5 MG TABS tablet Take 2.5 mg by mouth 2 (two) times daily.   benzocaine (ORAJEL) 10 % mucosal gel Use as directed in the mouth or throat 3 (three) times daily as needed for mouth pain.   cetirizine (ZYRTEC) 10 MG tablet Take 10 mg by mouth daily.   dextromethorphan-guaiFENesin (ROBITUSSIN-DM) 10-100 MG/5ML liquid Take 10 mLs by mouth every 4  (four) hours as needed for cough.   feeding supplement (ENSURE ENLIVE / ENSURE PLUS) LIQD Take 237 mLs by mouth 3 (three) times daily between meals.   hydrocortisone cream 1 % Apply 1 Application topically 2 (two) times daily as needed for itching.   Infant Care Products (DERMACLOUD EX) Nystatin Cream 3:1 every shift for yeast.   levothyroxine (SYNTHROID, LEVOTHROID) 25 MCG tablet TAKE 1 TABLET (25 MCG TOTAL) BY MOUTH DAILY.   LORazepam (ATIVAN) 0.5 MG tablet Take 1 tablet (0.5 mg total) by mouth every 8 (eight) hours.   Melatonin 5 MG CAPS Take 1 capsule by mouth at bedtime as needed.   morphine (ROXANOL) 20 MG/ML concentrated solution Take 0.25 mLs (5 mg total) by mouth every 2 (two) hours as needed for severe pain.   nystatin powder Apply 1 Application topically 2 (two) times daily as needed.   phenazopyridine (PYRIDIUM) 100 MG tablet Take 100 mg by mouth 2 (two) times daily as needed for pain.   senna (SENOKOT) 8.6 MG TABS tablet Take 2 tablets by mouth daily.   No facility-administered encounter medications on file as of 05/12/2022.    Review of Systems  Immunization History  Administered Date(s) Administered   Influenza Split 10/26/2010, 01/26/2012, 09/20/2013   Influenza-Unspecified 11/30/2012, 11/05/2020, 11/05/2021   Moderna Sars-Covid-2 Vaccination 02/28/2019, 03/28/2019   Pneumococcal Conjugate-13 12/21/2007   Pneumococcal Polysaccharide-23 12/01/2007   Pneumococcal-Unspecified 11/22/2007   Unspecified SARS-COV-2 Vaccination 12/02/2019, 06/08/2020, 10/12/2020   Pertinent  Health Maintenance Due  Topic Date Due   HEMOGLOBIN A1C  11/12/2016   INFLUENZA VACCINE  08/21/2022   DEXA SCAN  Completed   FOOT EXAM  Discontinued   OPHTHALMOLOGY EXAM  Discontinued      12/28/2021    9:00 AM 12/28/2021   10:22 PM 12/29/2021   11:25 AM 12/29/2021   10:05 PM 12/30/2021    7:15 AM  Fall Risk  (RETIRED) Patient Fall Risk Level High fall risk High fall risk High fall risk High fall  risk High fall risk   Functional Status Survey:    Vitals:   05/12/22 1004  BP: 137/73  Pulse: 74  Resp: 18  Temp: 97.8 F (36.6 C)  SpO2: 90%  Weight: 113 lb 9.6 oz (51.5 kg)  Height: 5\' 5"  (1.651 m)   Body mass index is 18.9 kg/m. Physical Exam Constitutional:      Comments: Frail, thin  Cardiovascular:     Rate and Rhythm: Normal rate.     Pulses: Normal pulses.  Pulmonary:     Effort: Pulmonary effort is normal.  Skin:    Comments: Dry skin  Neurological:     Mental  Status: She is alert and oriented to person, place, and time.     Labs reviewed: Recent Labs    12/27/21 0743 12/29/21 0605 12/30/21 0510  NA 137 138 140  K 4.2 3.8 4.1  CL 105 106 106  CO2 GLUCOSE 107* 100* 105*  BUN 10 6* 10  CREATININE 0.67 0.55 0.60  CALCIUM 9.2 9.2 9.3  MG 1.8 1.9 2.1  PHOS 2.7 2.0* 3.1   Recent Labs    11/21/21 0000 12/26/21 0519  AST 12* 19  ALT 6* 10  ALKPHOS 41 54  BILITOT  --  0.7  PROT  --  6.9  ALBUMIN 3.5 3.2*   Recent Labs    12/26/21 0519 12/27/21 0743 12/29/21 0605 12/30/21 0510  WBC 19.9* 17.7* 8.4 7.7  NEUTROABS 16.2*  --   --   --   HGB 12.6 11.3* 11.6* 11.9*  HCT 39.9 35.2* 36.2 37.2  MCV 96.6 95.4 96.3 96.1  PLT 343 301 337 380   Lab Results  Component Value Date   TSH 2.67 11/20/2021   Lab Results  Component Value Date   HGBA1C 5.3 05/13/2016   Lab Results  Component Value Date   CHOL 176 11/21/2021   HDL 37 11/21/2021   LDLCALC 114 11/21/2021   TRIG 142 11/21/2021   CHOLHDL 4 08/11/2014    Significant Diagnostic Results in last 30 days:  No results found.  Assessment/Plan Hospice care patient - Plan: morphine (ROXANOL) 20 MG/ML concentrated solution Patient appears comfortable, however received call from hospice nurse that she was requiring more medication for comfort. Plan to increase. Discussed prognosis of days to week given poor PO intake. She was frail before this acute decline so minimal physiologic  reserve. Continue comfort measures. Discussed floater boots for heels to prevent worsening wounds and Vaseline for skin.    Family/ staff Communication: nursing, children  Labs/tests ordered:  none

## 2022-05-13 DIAGNOSIS — I69818 Other symptoms and signs involving cognitive functions following other cerebrovascular disease: Secondary | ICD-10-CM | POA: Diagnosis not present

## 2022-05-13 DIAGNOSIS — E785 Hyperlipidemia, unspecified: Secondary | ICD-10-CM | POA: Diagnosis not present

## 2022-05-13 DIAGNOSIS — I69891 Dysphagia following other cerebrovascular disease: Secondary | ICD-10-CM | POA: Diagnosis not present

## 2022-05-13 DIAGNOSIS — M1990 Unspecified osteoarthritis, unspecified site: Secondary | ICD-10-CM | POA: Diagnosis not present

## 2022-05-13 DIAGNOSIS — I739 Peripheral vascular disease, unspecified: Secondary | ICD-10-CM | POA: Diagnosis not present

## 2022-05-13 DIAGNOSIS — F0152 Vascular dementia, unspecified severity, with psychotic disturbance: Secondary | ICD-10-CM | POA: Diagnosis not present

## 2022-05-13 MED ORDER — MORPHINE SULFATE (CONCENTRATE) 20 MG/ML PO SOLN: 10.0000 mg | ORAL | 0 refills | Status: AC | PRN

## 2022-05-14 DIAGNOSIS — E785 Hyperlipidemia, unspecified: Secondary | ICD-10-CM | POA: Diagnosis not present

## 2022-05-14 DIAGNOSIS — I739 Peripheral vascular disease, unspecified: Secondary | ICD-10-CM | POA: Diagnosis not present

## 2022-05-14 DIAGNOSIS — I69818 Other symptoms and signs involving cognitive functions following other cerebrovascular disease: Secondary | ICD-10-CM | POA: Diagnosis not present

## 2022-05-14 DIAGNOSIS — F0152 Vascular dementia, unspecified severity, with psychotic disturbance: Secondary | ICD-10-CM | POA: Diagnosis not present

## 2022-05-14 DIAGNOSIS — M1990 Unspecified osteoarthritis, unspecified site: Secondary | ICD-10-CM | POA: Diagnosis not present

## 2022-05-14 DIAGNOSIS — I69891 Dysphagia following other cerebrovascular disease: Secondary | ICD-10-CM | POA: Diagnosis not present

## 2022-05-15 DIAGNOSIS — I739 Peripheral vascular disease, unspecified: Secondary | ICD-10-CM | POA: Diagnosis not present

## 2022-05-15 DIAGNOSIS — I69891 Dysphagia following other cerebrovascular disease: Secondary | ICD-10-CM | POA: Diagnosis not present

## 2022-05-15 DIAGNOSIS — M1990 Unspecified osteoarthritis, unspecified site: Secondary | ICD-10-CM | POA: Diagnosis not present

## 2022-05-15 DIAGNOSIS — E785 Hyperlipidemia, unspecified: Secondary | ICD-10-CM | POA: Diagnosis not present

## 2022-05-15 DIAGNOSIS — F0152 Vascular dementia, unspecified severity, with psychotic disturbance: Secondary | ICD-10-CM | POA: Diagnosis not present

## 2022-05-15 DIAGNOSIS — I69818 Other symptoms and signs involving cognitive functions following other cerebrovascular disease: Secondary | ICD-10-CM | POA: Diagnosis not present

## 2022-05-16 DIAGNOSIS — I69818 Other symptoms and signs involving cognitive functions following other cerebrovascular disease: Secondary | ICD-10-CM | POA: Diagnosis not present

## 2022-05-16 DIAGNOSIS — M1990 Unspecified osteoarthritis, unspecified site: Secondary | ICD-10-CM | POA: Diagnosis not present

## 2022-05-16 DIAGNOSIS — E785 Hyperlipidemia, unspecified: Secondary | ICD-10-CM | POA: Diagnosis not present

## 2022-05-16 DIAGNOSIS — I69891 Dysphagia following other cerebrovascular disease: Secondary | ICD-10-CM | POA: Diagnosis not present

## 2022-05-16 DIAGNOSIS — F0152 Vascular dementia, unspecified severity, with psychotic disturbance: Secondary | ICD-10-CM | POA: Diagnosis not present

## 2022-05-16 DIAGNOSIS — I739 Peripheral vascular disease, unspecified: Secondary | ICD-10-CM | POA: Diagnosis not present

## 2022-05-17 DIAGNOSIS — I69818 Other symptoms and signs involving cognitive functions following other cerebrovascular disease: Secondary | ICD-10-CM | POA: Diagnosis not present

## 2022-05-17 DIAGNOSIS — E785 Hyperlipidemia, unspecified: Secondary | ICD-10-CM | POA: Diagnosis not present

## 2022-05-17 DIAGNOSIS — F0152 Vascular dementia, unspecified severity, with psychotic disturbance: Secondary | ICD-10-CM | POA: Diagnosis not present

## 2022-05-17 DIAGNOSIS — M1990 Unspecified osteoarthritis, unspecified site: Secondary | ICD-10-CM | POA: Diagnosis not present

## 2022-05-17 DIAGNOSIS — I69891 Dysphagia following other cerebrovascular disease: Secondary | ICD-10-CM | POA: Diagnosis not present

## 2022-05-17 DIAGNOSIS — I739 Peripheral vascular disease, unspecified: Secondary | ICD-10-CM | POA: Diagnosis not present

## 2022-05-18 DIAGNOSIS — F0152 Vascular dementia, unspecified severity, with psychotic disturbance: Secondary | ICD-10-CM | POA: Diagnosis not present

## 2022-05-18 DIAGNOSIS — I69891 Dysphagia following other cerebrovascular disease: Secondary | ICD-10-CM | POA: Diagnosis not present

## 2022-05-18 DIAGNOSIS — I739 Peripheral vascular disease, unspecified: Secondary | ICD-10-CM | POA: Diagnosis not present

## 2022-05-18 DIAGNOSIS — I69818 Other symptoms and signs involving cognitive functions following other cerebrovascular disease: Secondary | ICD-10-CM | POA: Diagnosis not present

## 2022-05-18 DIAGNOSIS — M1990 Unspecified osteoarthritis, unspecified site: Secondary | ICD-10-CM | POA: Diagnosis not present

## 2022-05-18 DIAGNOSIS — E785 Hyperlipidemia, unspecified: Secondary | ICD-10-CM | POA: Diagnosis not present

## 2022-05-19 DIAGNOSIS — I69891 Dysphagia following other cerebrovascular disease: Secondary | ICD-10-CM | POA: Diagnosis not present

## 2022-05-19 DIAGNOSIS — E785 Hyperlipidemia, unspecified: Secondary | ICD-10-CM | POA: Diagnosis not present

## 2022-05-19 DIAGNOSIS — F0152 Vascular dementia, unspecified severity, with psychotic disturbance: Secondary | ICD-10-CM | POA: Diagnosis not present

## 2022-05-19 DIAGNOSIS — M1990 Unspecified osteoarthritis, unspecified site: Secondary | ICD-10-CM | POA: Diagnosis not present

## 2022-05-19 DIAGNOSIS — I739 Peripheral vascular disease, unspecified: Secondary | ICD-10-CM | POA: Diagnosis not present

## 2022-05-19 DIAGNOSIS — I69818 Other symptoms and signs involving cognitive functions following other cerebrovascular disease: Secondary | ICD-10-CM | POA: Diagnosis not present

## 2022-05-20 DIAGNOSIS — I739 Peripheral vascular disease, unspecified: Secondary | ICD-10-CM | POA: Diagnosis not present

## 2022-05-20 DIAGNOSIS — I69891 Dysphagia following other cerebrovascular disease: Secondary | ICD-10-CM | POA: Diagnosis not present

## 2022-05-20 DIAGNOSIS — I69818 Other symptoms and signs involving cognitive functions following other cerebrovascular disease: Secondary | ICD-10-CM | POA: Diagnosis not present

## 2022-05-20 DIAGNOSIS — M1990 Unspecified osteoarthritis, unspecified site: Secondary | ICD-10-CM | POA: Diagnosis not present

## 2022-05-20 DIAGNOSIS — F0152 Vascular dementia, unspecified severity, with psychotic disturbance: Secondary | ICD-10-CM | POA: Diagnosis not present

## 2022-05-20 DIAGNOSIS — E785 Hyperlipidemia, unspecified: Secondary | ICD-10-CM | POA: Diagnosis not present

## 2022-05-21 DIAGNOSIS — J309 Allergic rhinitis, unspecified: Secondary | ICD-10-CM | POA: Diagnosis not present

## 2022-05-21 DIAGNOSIS — I252 Old myocardial infarction: Secondary | ICD-10-CM | POA: Diagnosis not present

## 2022-05-21 DIAGNOSIS — R32 Unspecified urinary incontinence: Secondary | ICD-10-CM | POA: Diagnosis not present

## 2022-05-21 DIAGNOSIS — F0152 Vascular dementia, unspecified severity, with psychotic disturbance: Secondary | ICD-10-CM | POA: Diagnosis not present

## 2022-05-21 DIAGNOSIS — M1990 Unspecified osteoarthritis, unspecified site: Secondary | ICD-10-CM | POA: Diagnosis not present

## 2022-05-21 DIAGNOSIS — Z8619 Personal history of other infectious and parasitic diseases: Secondary | ICD-10-CM | POA: Diagnosis not present

## 2022-05-21 DIAGNOSIS — I739 Peripheral vascular disease, unspecified: Secondary | ICD-10-CM | POA: Diagnosis not present

## 2022-05-21 DIAGNOSIS — I10 Essential (primary) hypertension: Secondary | ICD-10-CM | POA: Diagnosis not present

## 2022-05-21 DIAGNOSIS — Z8673 Personal history of transient ischemic attack (TIA), and cerebral infarction without residual deficits: Secondary | ICD-10-CM | POA: Diagnosis not present

## 2022-05-21 DIAGNOSIS — R159 Full incontinence of feces: Secondary | ICD-10-CM | POA: Diagnosis not present

## 2022-05-21 DIAGNOSIS — I2510 Atherosclerotic heart disease of native coronary artery without angina pectoris: Secondary | ICD-10-CM | POA: Diagnosis not present

## 2022-05-21 DIAGNOSIS — I69818 Other symptoms and signs involving cognitive functions following other cerebrovascular disease: Secondary | ICD-10-CM | POA: Diagnosis not present

## 2022-05-21 DIAGNOSIS — G4700 Insomnia, unspecified: Secondary | ICD-10-CM | POA: Diagnosis not present

## 2022-05-21 DIAGNOSIS — I69891 Dysphagia following other cerebrovascular disease: Secondary | ICD-10-CM | POA: Diagnosis not present

## 2022-05-21 DIAGNOSIS — R627 Adult failure to thrive: Secondary | ICD-10-CM | POA: Diagnosis not present

## 2022-05-21 DIAGNOSIS — Z741 Need for assistance with personal care: Secondary | ICD-10-CM | POA: Diagnosis not present

## 2022-05-21 DIAGNOSIS — E039 Hypothyroidism, unspecified: Secondary | ICD-10-CM | POA: Diagnosis not present

## 2022-05-21 DIAGNOSIS — Z681 Body mass index (BMI) 19 or less, adult: Secondary | ICD-10-CM | POA: Diagnosis not present

## 2022-05-21 DIAGNOSIS — K219 Gastro-esophageal reflux disease without esophagitis: Secondary | ICD-10-CM | POA: Diagnosis not present

## 2022-05-21 DIAGNOSIS — E785 Hyperlipidemia, unspecified: Secondary | ICD-10-CM | POA: Diagnosis not present

## 2022-05-21 DIAGNOSIS — R634 Abnormal weight loss: Secondary | ICD-10-CM | POA: Diagnosis not present

## 2022-05-22 DIAGNOSIS — E785 Hyperlipidemia, unspecified: Secondary | ICD-10-CM | POA: Diagnosis not present

## 2022-05-22 DIAGNOSIS — I69891 Dysphagia following other cerebrovascular disease: Secondary | ICD-10-CM | POA: Diagnosis not present

## 2022-05-22 DIAGNOSIS — I739 Peripheral vascular disease, unspecified: Secondary | ICD-10-CM | POA: Diagnosis not present

## 2022-05-22 DIAGNOSIS — I69818 Other symptoms and signs involving cognitive functions following other cerebrovascular disease: Secondary | ICD-10-CM | POA: Diagnosis not present

## 2022-05-22 DIAGNOSIS — M1990 Unspecified osteoarthritis, unspecified site: Secondary | ICD-10-CM | POA: Diagnosis not present

## 2022-05-22 DIAGNOSIS — F0152 Vascular dementia, unspecified severity, with psychotic disturbance: Secondary | ICD-10-CM | POA: Diagnosis not present

## 2022-05-23 DIAGNOSIS — I69818 Other symptoms and signs involving cognitive functions following other cerebrovascular disease: Secondary | ICD-10-CM | POA: Diagnosis not present

## 2022-05-23 DIAGNOSIS — F0152 Vascular dementia, unspecified severity, with psychotic disturbance: Secondary | ICD-10-CM | POA: Diagnosis not present

## 2022-05-23 DIAGNOSIS — I739 Peripheral vascular disease, unspecified: Secondary | ICD-10-CM | POA: Diagnosis not present

## 2022-05-23 DIAGNOSIS — E785 Hyperlipidemia, unspecified: Secondary | ICD-10-CM | POA: Diagnosis not present

## 2022-05-23 DIAGNOSIS — M1990 Unspecified osteoarthritis, unspecified site: Secondary | ICD-10-CM | POA: Diagnosis not present

## 2022-05-23 DIAGNOSIS — I69891 Dysphagia following other cerebrovascular disease: Secondary | ICD-10-CM | POA: Diagnosis not present

## 2022-06-21 DEATH — deceased
